# Patient Record
Sex: Female | Born: 1980
Health system: Southern US, Community
[De-identification: ages and names within clinical notes are randomized; demographics above are authoritative.]

## PROBLEM LIST (undated history)

## (undated) DIAGNOSIS — T7840XA Allergy, unspecified, initial encounter: Secondary | ICD-10-CM

## (undated) DIAGNOSIS — G709 Myoneural disorder, unspecified: Secondary | ICD-10-CM

## (undated) DIAGNOSIS — F419 Anxiety disorder, unspecified: Secondary | ICD-10-CM

## (undated) DIAGNOSIS — IMO0002 Reserved for concepts with insufficient information to code with codable children: Secondary | ICD-10-CM

## (undated) DIAGNOSIS — R51 Headache: Secondary | ICD-10-CM

## (undated) DIAGNOSIS — N920 Excessive and frequent menstruation with regular cycle: Secondary | ICD-10-CM

## (undated) DIAGNOSIS — N200 Calculus of kidney: Secondary | ICD-10-CM

## (undated) DIAGNOSIS — N898 Other specified noninflammatory disorders of vagina: Secondary | ICD-10-CM

## (undated) DIAGNOSIS — M419 Scoliosis, unspecified: Secondary | ICD-10-CM

## (undated) DIAGNOSIS — R10814 Left lower quadrant abdominal tenderness: Secondary | ICD-10-CM

## (undated) DIAGNOSIS — J069 Acute upper respiratory infection, unspecified: Secondary | ICD-10-CM

## (undated) DIAGNOSIS — D649 Anemia, unspecified: Secondary | ICD-10-CM

## (undated) DIAGNOSIS — K219 Gastro-esophageal reflux disease without esophagitis: Secondary | ICD-10-CM

## (undated) DIAGNOSIS — R87629 Unspecified abnormal cytological findings in specimens from vagina: Secondary | ICD-10-CM

## (undated) DIAGNOSIS — Z8742 Personal history of other diseases of the female genital tract: Secondary | ICD-10-CM

## (undated) DIAGNOSIS — A749 Chlamydial infection, unspecified: Secondary | ICD-10-CM

## (undated) DIAGNOSIS — G8929 Other chronic pain: Secondary | ICD-10-CM

## (undated) DIAGNOSIS — N63 Unspecified lump in unspecified breast: Secondary | ICD-10-CM

## (undated) DIAGNOSIS — M359 Systemic involvement of connective tissue, unspecified: Secondary | ICD-10-CM

## (undated) DIAGNOSIS — N6310 Unspecified lump in the right breast, unspecified quadrant: Secondary | ICD-10-CM

## (undated) DIAGNOSIS — E669 Obesity, unspecified: Secondary | ICD-10-CM

## (undated) DIAGNOSIS — N644 Mastodynia: Secondary | ICD-10-CM

## (undated) DIAGNOSIS — N83209 Unspecified ovarian cyst, unspecified side: Secondary | ICD-10-CM

## (undated) DIAGNOSIS — I10 Essential (primary) hypertension: Secondary | ICD-10-CM

## (undated) DIAGNOSIS — N946 Dysmenorrhea, unspecified: Secondary | ICD-10-CM

## (undated) DIAGNOSIS — D890 Polyclonal hypergammaglobulinemia: Secondary | ICD-10-CM

## (undated) DIAGNOSIS — D251 Intramural leiomyoma of uterus: Secondary | ICD-10-CM

## (undated) DIAGNOSIS — L509 Urticaria, unspecified: Secondary | ICD-10-CM

## (undated) DIAGNOSIS — A549 Gonococcal infection, unspecified: Secondary | ICD-10-CM

## (undated) HISTORY — DX: Personal history of other diseases of the female genital tract: Z87.42

## (undated) HISTORY — DX: Myoneural disorder, unspecified: G70.9

## (undated) HISTORY — DX: Anemia, unspecified: D64.9

## (undated) HISTORY — DX: Polyclonal hypergammaglobulinemia: D89.0

## (undated) HISTORY — DX: Calculus of kidney: N20.0

## (undated) HISTORY — DX: Systemic involvement of connective tissue, unspecified: M35.9

## (undated) HISTORY — DX: Unspecified lump in the right breast, unspecified quadrant: N63.10

## (undated) HISTORY — DX: Gastro-esophageal reflux disease without esophagitis: K21.9

## (undated) HISTORY — DX: Obesity, unspecified: E66.9

## (undated) HISTORY — PX: DILATION AND CURETTAGE OF UTERUS: SHX78

## (undated) HISTORY — DX: Mastodynia: N64.4

## (undated) HISTORY — DX: Left lower quadrant abdominal tenderness: R10.814

## (undated) HISTORY — DX: Unspecified abnormal cytological findings in specimens from vagina: R87.629

## (undated) HISTORY — DX: Anxiety disorder, unspecified: F41.9

## (undated) HISTORY — DX: Gonococcal infection, unspecified: A54.9

## (undated) HISTORY — DX: Dysmenorrhea, unspecified: N94.6

## (undated) HISTORY — DX: Intramural leiomyoma of uterus: D25.1

## (undated) HISTORY — DX: Reserved for concepts with insufficient information to code with codable children: IMO0002

## (undated) HISTORY — PX: ADENOIDECTOMY: SUR15

## (undated) HISTORY — DX: Allergy, unspecified, initial encounter: T78.40XA

## (undated) HISTORY — DX: Acute upper respiratory infection, unspecified: J06.9

## (undated) HISTORY — DX: Scoliosis, unspecified: M41.9

## (undated) HISTORY — DX: Essential (primary) hypertension: I10

## (undated) HISTORY — DX: Excessive and frequent menstruation with regular cycle: N92.0

## (undated) HISTORY — DX: Other chronic pain: G89.29

## (undated) HISTORY — PX: TUBAL LIGATION: SHX77

## (undated) HISTORY — DX: Headache: R51

## (undated) HISTORY — PX: TONSILLECTOMY: SUR1361

## (undated) HISTORY — DX: Unspecified ovarian cyst, unspecified side: N83.209

## (undated) HISTORY — DX: Unspecified lump in unspecified breast: N63.0

## (undated) HISTORY — DX: Urticaria, unspecified: L50.9

## (undated) HISTORY — DX: Other specified noninflammatory disorders of vagina: N89.8

## (undated) HISTORY — DX: Chlamydial infection, unspecified: A74.9

---
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
## 2002-01-21 ENCOUNTER — Emergency Department (HOSPITAL_COMMUNITY): Admission: EM | Admit: 2002-01-21 | Discharge: 2002-01-21 | Payer: Self-pay | Admitting: Emergency Medicine

## 2002-06-25 ENCOUNTER — Ambulatory Visit (HOSPITAL_COMMUNITY): Admission: AD | Admit: 2002-06-25 | Discharge: 2002-06-25 | Payer: Self-pay | Admitting: Obstetrics and Gynecology

## 2002-06-28 ENCOUNTER — Ambulatory Visit (HOSPITAL_COMMUNITY): Admission: RE | Admit: 2002-06-28 | Discharge: 2002-06-28 | Payer: Self-pay | Admitting: Obstetrics and Gynecology

## 2002-10-20 ENCOUNTER — Ambulatory Visit (HOSPITAL_COMMUNITY): Admission: AD | Admit: 2002-10-20 | Discharge: 2002-10-20 | Payer: Self-pay | Admitting: Obstetrics and Gynecology

## 2002-10-21 ENCOUNTER — Ambulatory Visit (HOSPITAL_COMMUNITY): Admission: AD | Admit: 2002-10-21 | Discharge: 2002-10-21 | Payer: Self-pay | Admitting: Obstetrics and Gynecology

## 2002-11-07 ENCOUNTER — Ambulatory Visit (HOSPITAL_COMMUNITY): Admission: AD | Admit: 2002-11-07 | Discharge: 2002-11-07 | Payer: Self-pay | Admitting: Obstetrics and Gynecology

## 2002-11-08 ENCOUNTER — Ambulatory Visit (HOSPITAL_COMMUNITY): Admission: AD | Admit: 2002-11-08 | Discharge: 2002-11-08 | Payer: Self-pay | Admitting: Obstetrics and Gynecology

## 2002-11-18 ENCOUNTER — Inpatient Hospital Stay (HOSPITAL_COMMUNITY): Admission: AD | Admit: 2002-11-18 | Discharge: 2002-11-22 | Payer: Self-pay | Admitting: Obstetrics and Gynecology

## 2003-02-24 ENCOUNTER — Ambulatory Visit (HOSPITAL_COMMUNITY): Admission: RE | Admit: 2003-02-24 | Discharge: 2003-02-24 | Payer: Self-pay | Admitting: *Deleted

## 2004-04-10 ENCOUNTER — Emergency Department (HOSPITAL_COMMUNITY): Admission: EM | Admit: 2004-04-10 | Discharge: 2004-04-10 | Payer: Self-pay | Admitting: Emergency Medicine

## 2004-09-27 ENCOUNTER — Ambulatory Visit: Payer: Self-pay | Admitting: Family Medicine

## 2004-09-27 ENCOUNTER — Emergency Department (HOSPITAL_COMMUNITY): Admission: EM | Admit: 2004-09-27 | Discharge: 2004-09-27 | Payer: Self-pay | Admitting: Emergency Medicine

## 2005-04-17 ENCOUNTER — Emergency Department (HOSPITAL_COMMUNITY): Admission: EM | Admit: 2005-04-17 | Discharge: 2005-04-18 | Payer: Self-pay | Admitting: Emergency Medicine

## 2005-04-18 ENCOUNTER — Ambulatory Visit: Payer: Self-pay | Admitting: Family Medicine

## 2005-05-23 ENCOUNTER — Ambulatory Visit: Payer: Self-pay | Admitting: Family Medicine

## 2005-08-01 ENCOUNTER — Ambulatory Visit: Payer: Self-pay | Admitting: Family Medicine

## 2005-12-04 ENCOUNTER — Ambulatory Visit: Payer: Self-pay | Admitting: Family Medicine

## 2006-01-21 ENCOUNTER — Ambulatory Visit: Payer: Self-pay | Admitting: Family Medicine

## 2006-04-17 ENCOUNTER — Emergency Department (HOSPITAL_COMMUNITY): Admission: EM | Admit: 2006-04-17 | Discharge: 2006-04-17 | Payer: Self-pay | Admitting: Emergency Medicine

## 2006-05-27 ENCOUNTER — Ambulatory Visit: Payer: Self-pay | Admitting: Family Medicine

## 2006-05-28 ENCOUNTER — Ambulatory Visit: Payer: Self-pay | Admitting: Family Medicine

## 2006-07-29 ENCOUNTER — Ambulatory Visit: Payer: Self-pay | Admitting: Family Medicine

## 2006-08-19 ENCOUNTER — Ambulatory Visit: Payer: Self-pay | Admitting: Family Medicine

## 2006-08-20 ENCOUNTER — Ambulatory Visit (HOSPITAL_COMMUNITY): Admission: RE | Admit: 2006-08-20 | Discharge: 2006-08-20 | Payer: Self-pay | Admitting: Family Medicine

## 2006-10-07 ENCOUNTER — Ambulatory Visit: Payer: Self-pay | Admitting: Family Medicine

## 2006-11-11 ENCOUNTER — Ambulatory Visit: Payer: Self-pay | Admitting: Family Medicine

## 2006-11-26 ENCOUNTER — Ambulatory Visit: Payer: Self-pay | Admitting: Family Medicine

## 2006-11-26 ENCOUNTER — Encounter: Payer: Self-pay | Admitting: Orthopedic Surgery

## 2006-11-26 ENCOUNTER — Ambulatory Visit (HOSPITAL_COMMUNITY): Admission: RE | Admit: 2006-11-26 | Discharge: 2006-11-26 | Payer: Self-pay | Admitting: Family Medicine

## 2007-02-27 ENCOUNTER — Ambulatory Visit: Payer: Self-pay | Admitting: Family Medicine

## 2007-04-03 ENCOUNTER — Ambulatory Visit: Payer: Self-pay | Admitting: Orthopedic Surgery

## 2007-04-03 DIAGNOSIS — S93609A Unspecified sprain of unspecified foot, initial encounter: Secondary | ICD-10-CM | POA: Insufficient documentation

## 2007-04-16 ENCOUNTER — Ambulatory Visit: Payer: Self-pay | Admitting: Family Medicine

## 2007-05-13 ENCOUNTER — Emergency Department (HOSPITAL_COMMUNITY): Admission: EM | Admit: 2007-05-13 | Discharge: 2007-05-13 | Payer: Self-pay | Admitting: Emergency Medicine

## 2007-05-13 ENCOUNTER — Ambulatory Visit: Payer: Self-pay | Admitting: Family Medicine

## 2007-05-14 ENCOUNTER — Emergency Department (HOSPITAL_COMMUNITY): Admission: EM | Admit: 2007-05-14 | Discharge: 2007-05-14 | Payer: Self-pay | Admitting: Emergency Medicine

## 2007-06-26 ENCOUNTER — Ambulatory Visit: Payer: Self-pay | Admitting: Family Medicine

## 2007-06-28 ENCOUNTER — Encounter: Payer: Self-pay | Admitting: Family Medicine

## 2007-06-28 LAB — CONVERTED CEMR LAB
Eosinophils Absolute: 0.3 10*3/uL (ref 0.0–0.7)
Eosinophils Relative: 5 % (ref 0–5)
HCT: 42 % (ref 36.0–46.0)
Hemoglobin: 13.8 g/dL (ref 12.0–15.0)
Lymphocytes Relative: 30 % (ref 12–46)
Lymphs Abs: 1.9 10*3/uL (ref 0.7–4.0)
MCV: 91.3 fL (ref 78.0–100.0)
Monocytes Absolute: 0.5 10*3/uL (ref 0.1–1.0)
Platelets: 342 10*3/uL (ref 150–400)
RDW: 13.4 % (ref 11.5–15.5)
TSH: 0.751 microintl units/mL (ref 0.350–5.50)
Total CHOL/HDL Ratio: 2.2
VLDL: 9 mg/dL (ref 0–40)
WBC: 6.5 10*3/uL (ref 4.0–10.5)

## 2007-11-03 ENCOUNTER — Encounter: Payer: Self-pay | Admitting: Family Medicine

## 2007-11-03 ENCOUNTER — Ambulatory Visit: Payer: Self-pay | Admitting: Family Medicine

## 2007-11-03 DIAGNOSIS — F411 Generalized anxiety disorder: Secondary | ICD-10-CM

## 2007-11-03 DIAGNOSIS — M545 Low back pain: Secondary | ICD-10-CM

## 2007-11-03 DIAGNOSIS — G8929 Other chronic pain: Secondary | ICD-10-CM

## 2007-11-06 ENCOUNTER — Encounter (HOSPITAL_COMMUNITY): Admission: RE | Admit: 2007-11-06 | Discharge: 2007-12-06 | Payer: Self-pay | Admitting: Family Medicine

## 2007-11-06 ENCOUNTER — Encounter: Payer: Self-pay | Admitting: Family Medicine

## 2007-11-10 ENCOUNTER — Encounter: Payer: Self-pay | Admitting: Family Medicine

## 2007-11-10 LAB — CONVERTED CEMR LAB: Glucose, Bld: 78 mg/dL (ref 70–99)

## 2008-01-27 ENCOUNTER — Telehealth: Payer: Self-pay | Admitting: Family Medicine

## 2008-01-28 ENCOUNTER — Ambulatory Visit: Payer: Self-pay | Admitting: Family Medicine

## 2008-02-03 ENCOUNTER — Ambulatory Visit (HOSPITAL_COMMUNITY): Admission: RE | Admit: 2008-02-03 | Discharge: 2008-02-03 | Payer: Self-pay | Admitting: Family Medicine

## 2008-02-03 ENCOUNTER — Ambulatory Visit: Payer: Self-pay | Admitting: Family Medicine

## 2008-02-03 LAB — CONVERTED CEMR LAB
Glucose, Urine, Semiquant: NEGATIVE
Nitrite: NEGATIVE
Specific Gravity, Urine: 1.02
WBC Urine, dipstick: NEGATIVE
pH: 6.5

## 2008-02-05 ENCOUNTER — Encounter: Payer: Self-pay | Admitting: Family Medicine

## 2008-02-05 DIAGNOSIS — N159 Renal tubulo-interstitial disease, unspecified: Secondary | ICD-10-CM | POA: Insufficient documentation

## 2008-02-05 DIAGNOSIS — R109 Unspecified abdominal pain: Secondary | ICD-10-CM

## 2008-02-11 ENCOUNTER — Ambulatory Visit (HOSPITAL_COMMUNITY): Admission: RE | Admit: 2008-02-11 | Discharge: 2008-02-11 | Payer: Self-pay | Admitting: Family Medicine

## 2008-04-07 ENCOUNTER — Emergency Department (HOSPITAL_COMMUNITY): Admission: EM | Admit: 2008-04-07 | Discharge: 2008-04-07 | Payer: Self-pay | Admitting: *Deleted

## 2008-04-19 ENCOUNTER — Ambulatory Visit: Payer: Self-pay | Admitting: Orthopedic Surgery

## 2008-04-19 DIAGNOSIS — M25579 Pain in unspecified ankle and joints of unspecified foot: Secondary | ICD-10-CM

## 2008-05-03 ENCOUNTER — Emergency Department (HOSPITAL_COMMUNITY): Admission: EM | Admit: 2008-05-03 | Discharge: 2008-05-03 | Payer: Self-pay | Admitting: Emergency Medicine

## 2008-05-14 ENCOUNTER — Ambulatory Visit: Payer: Self-pay | Admitting: Family Medicine

## 2008-05-25 ENCOUNTER — Encounter: Payer: Self-pay | Admitting: Family Medicine

## 2008-05-25 ENCOUNTER — Encounter (HOSPITAL_COMMUNITY): Admission: RE | Admit: 2008-05-25 | Discharge: 2008-06-24 | Payer: Self-pay | Admitting: Family Medicine

## 2008-06-25 ENCOUNTER — Encounter (HOSPITAL_COMMUNITY): Admission: RE | Admit: 2008-06-25 | Discharge: 2008-07-25 | Payer: Self-pay | Admitting: Family Medicine

## 2008-07-20 ENCOUNTER — Encounter: Payer: Self-pay | Admitting: Family Medicine

## 2008-08-04 ENCOUNTER — Encounter: Payer: Self-pay | Admitting: Family Medicine

## 2008-08-12 ENCOUNTER — Emergency Department (HOSPITAL_COMMUNITY): Admission: EM | Admit: 2008-08-12 | Discharge: 2008-08-12 | Payer: Self-pay | Admitting: Emergency Medicine

## 2008-08-12 ENCOUNTER — Telehealth: Payer: Self-pay | Admitting: Family Medicine

## 2008-08-13 ENCOUNTER — Emergency Department (HOSPITAL_COMMUNITY): Admission: EM | Admit: 2008-08-13 | Discharge: 2008-08-13 | Payer: Self-pay | Admitting: Emergency Medicine

## 2008-08-15 ENCOUNTER — Emergency Department (HOSPITAL_COMMUNITY): Admission: EM | Admit: 2008-08-15 | Discharge: 2008-08-15 | Payer: Self-pay | Admitting: Emergency Medicine

## 2008-08-16 ENCOUNTER — Ambulatory Visit: Payer: Self-pay | Admitting: Family Medicine

## 2008-08-16 DIAGNOSIS — R110 Nausea: Secondary | ICD-10-CM

## 2008-08-16 DIAGNOSIS — M79609 Pain in unspecified limb: Secondary | ICD-10-CM

## 2008-08-30 ENCOUNTER — Ambulatory Visit: Payer: Self-pay | Admitting: Family Medicine

## 2008-10-14 ENCOUNTER — Telehealth: Payer: Self-pay | Admitting: Family Medicine

## 2008-11-05 ENCOUNTER — Encounter: Payer: Self-pay | Admitting: Family Medicine

## 2008-11-29 ENCOUNTER — Telehealth: Payer: Self-pay | Admitting: Family Medicine

## 2008-11-30 ENCOUNTER — Encounter: Payer: Self-pay | Admitting: Family Medicine

## 2008-12-18 ENCOUNTER — Emergency Department (HOSPITAL_COMMUNITY): Admission: EM | Admit: 2008-12-18 | Discharge: 2008-12-18 | Payer: Self-pay | Admitting: Emergency Medicine

## 2008-12-20 ENCOUNTER — Ambulatory Visit: Payer: Self-pay | Admitting: Family Medicine

## 2008-12-20 ENCOUNTER — Telehealth: Payer: Self-pay | Admitting: Family Medicine

## 2009-01-17 ENCOUNTER — Encounter: Payer: Self-pay | Admitting: Family Medicine

## 2009-01-18 ENCOUNTER — Encounter: Payer: Self-pay | Admitting: Family Medicine

## 2009-01-18 ENCOUNTER — Other Ambulatory Visit: Admission: RE | Admit: 2009-01-18 | Discharge: 2009-01-18 | Payer: Self-pay | Admitting: Family Medicine

## 2009-01-18 ENCOUNTER — Ambulatory Visit: Payer: Self-pay | Admitting: Family Medicine

## 2009-01-18 DIAGNOSIS — R519 Headache, unspecified: Secondary | ICD-10-CM | POA: Insufficient documentation

## 2009-01-18 DIAGNOSIS — N643 Galactorrhea not associated with childbirth: Secondary | ICD-10-CM | POA: Insufficient documentation

## 2009-01-18 DIAGNOSIS — R51 Headache: Secondary | ICD-10-CM | POA: Insufficient documentation

## 2009-01-18 LAB — CONVERTED CEMR LAB
Basophils Absolute: 0.1 10*3/uL (ref 0.0–0.1)
Basophils Relative: 1 % (ref 0–1)
Calcium: 9.2 mg/dL (ref 8.4–10.5)
Cholesterol: 115 mg/dL (ref 0–200)
Glucose, Bld: 74 mg/dL (ref 70–99)
Hemoglobin: 13.6 g/dL (ref 12.0–15.0)
MCHC: 33.1 g/dL (ref 30.0–36.0)
Monocytes Absolute: 0.3 10*3/uL (ref 0.1–1.0)
Neutro Abs: 4.2 10*3/uL (ref 1.7–7.7)
Neutrophils Relative %: 57 % (ref 43–77)
Platelets: 290 10*3/uL (ref 150–400)
Prolactin: 7.8 ng/mL
RDW: 13.3 % (ref 11.5–15.5)
Sodium: 140 meq/L (ref 135–145)
Total CHOL/HDL Ratio: 2.5
Triglycerides: 49 mg/dL (ref <150)

## 2009-01-19 ENCOUNTER — Encounter: Payer: Self-pay | Admitting: Family Medicine

## 2009-01-19 LAB — CONVERTED CEMR LAB
Chlamydia, DNA Probe: NEGATIVE
GC Probe Amp, Genital: NEGATIVE

## 2009-01-24 ENCOUNTER — Encounter: Payer: Self-pay | Admitting: Family Medicine

## 2009-01-27 LAB — CONVERTED CEMR LAB: Candida species: POSITIVE — AB

## 2009-02-07 ENCOUNTER — Ambulatory Visit: Payer: Self-pay | Admitting: Family Medicine

## 2009-02-07 DIAGNOSIS — R197 Diarrhea, unspecified: Secondary | ICD-10-CM

## 2009-02-07 DIAGNOSIS — J019 Acute sinusitis, unspecified: Secondary | ICD-10-CM

## 2009-03-22 ENCOUNTER — Ambulatory Visit: Payer: Self-pay | Admitting: Family Medicine

## 2009-03-29 ENCOUNTER — Encounter (HOSPITAL_COMMUNITY): Admission: RE | Admit: 2009-03-29 | Discharge: 2009-04-28 | Payer: Self-pay | Admitting: Family Medicine

## 2009-03-29 ENCOUNTER — Encounter: Payer: Self-pay | Admitting: Family Medicine

## 2009-05-05 ENCOUNTER — Encounter (HOSPITAL_COMMUNITY): Admission: RE | Admit: 2009-05-05 | Discharge: 2009-06-04 | Payer: Self-pay | Admitting: Family Medicine

## 2009-05-05 ENCOUNTER — Encounter (INDEPENDENT_AMBULATORY_CARE_PROVIDER_SITE_OTHER): Payer: Self-pay | Admitting: *Deleted

## 2009-07-08 ENCOUNTER — Encounter: Payer: Self-pay | Admitting: Family Medicine

## 2009-07-08 ENCOUNTER — Telehealth: Payer: Self-pay | Admitting: Family Medicine

## 2009-07-19 ENCOUNTER — Encounter: Payer: Self-pay | Admitting: Family Medicine

## 2009-12-13 ENCOUNTER — Ambulatory Visit: Payer: Self-pay | Admitting: Family Medicine

## 2009-12-13 DIAGNOSIS — R5381 Other malaise: Secondary | ICD-10-CM | POA: Insufficient documentation

## 2009-12-13 DIAGNOSIS — R5383 Other fatigue: Secondary | ICD-10-CM

## 2009-12-16 ENCOUNTER — Telehealth: Payer: Self-pay | Admitting: Family Medicine

## 2009-12-21 ENCOUNTER — Telehealth: Payer: Self-pay | Admitting: Family Medicine

## 2010-03-13 ENCOUNTER — Ambulatory Visit: Payer: Self-pay | Admitting: Family Medicine

## 2010-03-13 DIAGNOSIS — R52 Pain, unspecified: Secondary | ICD-10-CM | POA: Insufficient documentation

## 2010-03-13 DIAGNOSIS — J209 Acute bronchitis, unspecified: Secondary | ICD-10-CM

## 2010-03-14 LAB — CONVERTED CEMR LAB
Basophils Absolute: 0.1 10*3/uL (ref 0.0–0.1)
CO2: 25 meq/L (ref 19–32)
Chloride: 107 meq/L (ref 96–112)
HDL: 46 mg/dL (ref >39)
Hemoglobin: 14.5 g/dL (ref 12.0–15.0)
LDL Cholesterol: 60 mg/dL (ref 0–99)
Lymphocytes Relative: 27 % (ref 12–46)
Neutro Abs: 6.2 10*3/uL (ref 1.7–7.7)
Platelets: 338 10*3/uL (ref 150–400)
RDW: 13 % (ref 11.5–15.5)
Sodium: 140 meq/L (ref 135–145)
TSH: 0.467 microintl units/mL (ref 0.350–4.500)
Total CHOL/HDL Ratio: 2.7

## 2010-04-04 ENCOUNTER — Encounter: Payer: Self-pay | Admitting: Family Medicine

## 2010-06-01 NOTE — Assessment & Plan Note (Signed)
Summary: OV   Vital Signs:  Patient profile:   30 year old female Menstrual status:  Depo Height:      65 inches Weight:      230.75 pounds BMI:     38.54 O2 Sat:      92 % on Room air Pulse rate:   112 / minute Pulse rhythm:   regular Resp:     16 per minute BP sitting:   110 / 80  (left arm)  Vitals Entered By: Mauricia Area CMA (March 13, 2010 11:04 AM)  Nutrition Counseling: Patient's BMI is greater than 25 and therefore counseled on weight management options.  O2 Flow:  Room air CC: Sore throat, headache   Primary Care Provider:  Syliva Overman MD  CC:  Sore throat and headache.  History of Present Illness: 1 week h/o heAD AND CHEST CONGESTION WHICH HAS WORSENED AND ALSO SORE THROAT AND VOICE LOSS. pT IS EXPERIENCING GENERALISED BODY ACHES, FATIGUE AND VOICE LOSS. SHE continues to experience gechronic back pain and has actually gained weight.  Current Medications (verified): 1)  Topiramate 25 Mg Tabs (Topiramate) .... Take 1 Tablet By Mouth Two Times A Day  Allergies (verified): 1)  ! Lortab  Review of Systems      See HPI General:  Complains of chills, fatigue, loss of appetite, malaise, and weakness. Eyes:  Denies discharge, eye pain, and red eye. ENT:  Complains of hoarseness, nasal congestion, ringing in ears, sinus pressure, and sore throat. CV:  Denies chest pain or discomfort, palpitations, and swelling of hands. Resp:  Complains of cough, shortness of breath, and sputum productive. GI:  Denies abdominal pain, constipation, diarrhea, nausea, and vomiting blood. GU:  Denies dysuria and urinary frequency. MS:  Complains of joint pain, low back pain, mid back pain, and muscle aches. Derm:  Denies itching, lesion(s), and rash. Neuro:  Complains of headaches; denies poor balance and seizures. Psych:  Denies anxiety and depression. Endo:  Denies cold intolerance, excessive hunger, excessive thirst, and excessive urination. Heme:  Denies abnormal bruising  and bleeding. Allergy:  Denies hives or rash and itching eyes.  Physical Exam  General:  Well-developed, obese,in no acute distress; alert,appropriate and cooperative throughout examination. Ill appearing HEENT: No facial asymmetry, blateral cervical adenitis EOMI,frontal and maxillary sinus tenderness, TM's Clear, oropharynx  pink and moist.   Chest: decreased air entry bilaterally, scattered crackles and wheezes CVS: S1, S2, No murmurs, No S3.   Abd: Soft, Nontender.obese  MS: decreased ROM spine,adequate in  hips, shoulders and knees.  Ext: No edema.   CNS: CN 2-12 intact, power tone and sensation normal throughout.   Skin: Intact, no visible lesions or rashes.  Psych: Good eye contact, normal affect.  Memory intact, not anxious or depressed appearing.    Impression & Recommendations:  Problem # 1:  GENERALIZED PAIN (ICD-780.96) Assessment Comment Only  Orders: Tylenol 325 mg tab (EMRORAL)  Problem # 2:  ACUTE BRONCHITIS (ICD-466.0) Assessment: Comment Only  Her updated medication list for this problem includes:    Penicillin V Potassium 500 Mg Tabs (Penicillin v potassium) .Marland Kitchen... Take 1 tablet by mouth three times a day    Tessalon Perles 100 Mg Caps (Benzonatate) .Marland Kitchen... Take 1 capsule by mouth three times a day  Orders: T-CBC w/Diff ([id number]) Depo- Medrol 80mg  (J1040) Rocephin  250mg  (J4782) Admin of Therapeutic Inj  intramuscular or subcutaneous (95621)  Problem # 3:  ACUTE SINUSITIS, UNSPECIFIED (ICD-461.9) Assessment: Comment Only  Her updated medication list for  this problem includes:    Penicillin V Potassium 500 Mg Tabs (Penicillin v potassium) .Marland Kitchen... Take 1 tablet by mouth three times a day    Tessalon Perles 100 Mg Caps (Benzonatate) .Marland Kitchen... Take 1 capsule by mouth three times a day  Problem # 4:  GENERALIZED ANXIETY DISORDER (ICD-300.02)  Problem # 5:  OBESITY (ICD-278.00) Assessment: Deteriorated  Ht: 65 (03/13/2010)   Wt: 230.75 (03/13/2010)   BMI:  38.54 (03/13/2010) therapeutic lifestyle change discussed and encouraged  Complete Medication List: 1)  Topiramate 25 Mg Tabs (Topiramate) .... Take 1 tablet by mouth two times a day 2)  Penicillin V Potassium 500 Mg Tabs (Penicillin v potassium) .... Take 1 tablet by mouth three times a day 3)  Tessalon Perles 100 Mg Caps (Benzonatate) .... Take 1 capsule by mouth three times a day 4)  Fluconazole 150 Mg Tabs (Fluconazole) .... Take 1 tablet by mouth once a day as needed vaginal itch  Other Orders: T-Basic Metabolic Panel [id number]) T-Lipid Profile [id number]) T-TSH [phone]) T- Hemoglobin A1C ([id number])  Patient Instructions: 1)  Please schedule a follow-up appointment in 4 months. 2)  It is important that you exercise regularly at least 20 minutes 5 times a week. If you develop chest pain, have severe difficulty breathing, or feel very tired , stop exercising immediately and seek medical attention. 3)  You need to lose weight. Consider a lower calorie diet and regular exercise.  4)  you are being treated for acute sinusitis and bronchitis. 5)  meds are sent in ,you also need to practice voice rest and salt water gargles for your hoarseness 6)  You will get injections in the office 7)  fasting labs today Prescriptions: FLUCONAZOLE 150 MG TABS (FLUCONAZOLE) Take 1 tablet by mouth once a day as needed vaginal itch  #3 x 0   Entered and Authorized by:   Syliva Overman MD   Signed by:   Syliva Overman MD on 03/13/2010   Method used:   Electronically to        [facility] (retail)       [address] [place], [place]  [address]       Ph: [phone]       Fax: [phone]   RxID:   [id number] TESSALON PERLES 100 MG CAPS (BENZONATATE) Take 1 capsule by mouth three times a day  #21 x 0   Entered and Authorized by:   Syliva Overman MD   Signed by:   Syliva Overman MD on 03/13/2010   Method used:   Electronically to         [facility] (retail)       [address] [place], [place]  [address]       Ph: [phone]       Fax: [phone]   RxID:   [id number] PREDNISONE (PAK) 5 MG TABS (PREDNISONE) Use as directed  #21 x 0   Entered and Authorized by:   Syliva Overman MD   Signed by:   Syliva Overman MD on 03/13/2010   Method used:   Electronically to        [facility] (retail)       [address]       [place], [place]  [address]  Ph: [phone]       Fax: [phone]   RxID:   [id number] PENICILLIN V POTASSIUM 500 MG TABS (PENICILLIN V POTASSIUM) Take 1 tablet by mouth three times a day  #30 x 0   Entered and Authorized by:   Syliva Overman MD   Signed by:   Syliva Overman MD on 03/13/2010   Method used:   Electronically to        [facility] (retail)       [address]       [place], [place]  [address]       Ph: [phone]       Fax: [phone]   RxID:   [id number]    Medication Administration  Injection # 1:    Medication: Depo- Medrol 80mg     Diagnosis: ACUTE BRONCHITIS (ICD-466.0)    Route: IM    Site: RUOQ gluteus    Exp Date: 07/12    Lot #: [id number]    Mfr: Pharmacia    Patient tolerated injection without complications    Given by: Adella Hare LPN (March 13, 2010 12:15 PM)  Injection # 2:    Medication: Rocephin  250mg     Diagnosis: ACUTE BRONCHITIS (ICD-466.0)    Route: IM    Site: LUOQ gluteus    Exp Date: 05/14    Lot #: [id number]    Mfr: [email or web address]    Comments: rocephin 500mg  given    Patient tolerated injection without complications    Given by: Adella Hare LPN (March 13, 2010 12:15 PM)  Medication # 1:    Medication: Tylenol 325 mg tab    Diagnosis: GENERALIZED PAIN (ICD-780.96)    Dose: 1 tablet    Route: po    Exp Date: 10/12    Lot #: [id number]    Mfr: gemini pharm    Patient tolerated medication without  complications    Given by: Adella Hare LPN (March 13, 2010 12:17 PM)  Orders Added: 1)  Est. Patient Level IV [99[address]] 2)  T-Basic Metabolic Panel [[id number]] 3)  T-Lipid Profile [80061-22930] 4)  T-CBC w/Diff [[address]-27062] 5)  T-TSH [37628-31517] 6)  T- Hemoglobin A1C [83036-23375] 7)  Depo- Medrol 80mg  [J1040] 8)  Rocephin  250mg  [J0696] 9)  Admin of Therapeutic Inj  intramuscular or subcutaneous [96372] 10)  Tylenol 325 mg tab [EMRORAL]     Medication Administration  Injection # 1:    Medication: Depo- Medrol 80mg     Diagnosis: ACUTE BRONCHITIS (ICD-466.0)    Route: IM    Site: RUOQ gluteus    Exp Date: 07/12    Lot #: Gunnar [id number]    Mfr: Pharmacia    Patient tolerated injection without complications    Given by: Adella Hare LPN (March 13, 2010 12:15 PM)  Injection # 2:    Medication: Rocephin  250mg     Diagnosis: ACUTE BRONCHITIS (ICD-466.0)    Route: IM    Site: LUOQ gluteus    Exp Date: 05/14    Lot #: [id number]    Mfr: [email or web address]    Comments: rocephin 500mg  given    Patient tolerated injection without complications    Given by: Adella Hare LPN (March 13, 2010 12:15 PM)  Medication # 1:    Medication: Tylenol 325 mg tab    Diagnosis: GENERALIZED PAIN (ICD-780.96)    Dose: 1 tablet    Route: po    Exp Date: 10/12    Lot #: [id number]  Mfr: gemini pharm    Patient tolerated medication without complications    Given by: Adella Hare LPN (March 13, 2010 12:17 PM)  Orders Added: 1)  Est. Patient Level IV [99214] 2)  T-Basic Metabolic Panel [id number] 3)  T-Lipid Profile [80061-22930] 4)  T-CBC w/Diff [82956-21308] 5)  T-TSH [65784-69629] 6)  T- Hemoglobin A1C [83036-23375] 7)  Depo- Medrol 80mg  [J1040] 8)  Rocephin  250mg  [J0696] 9)  Admin of Therapeutic Inj  intramuscular or subcutaneous [96372] 10)  Tylenol 325 mg tab [EMRORAL]

## 2010-06-01 NOTE — Progress Notes (Signed)
  Phone Note From Pharmacy   Caller: [facility] Summary of Call: skelaxin not covered generic soma is do you want to change? Initial call taken by: Adella Hare LPN,  December 16, 2009 4:21 PM  Follow-up for Phone Call        soma has been sent in place of ske;laxin pls let pt know Follow-up by: Syliva Overman MD,  December 18, 2009 8:30 PM  Additional Follow-up for Phone Call Additional follow up Details #1::        called patient, left message Additional Follow-up by: Adella Hare LPN,  December 19, 2009 11:44 AM    Additional Follow-up for Phone Call Additional follow up Details #2::    patient aware Follow-up by: Adella Hare LPN,  December 19, 2009 3:03 PM  New/Updated Medications: CARISOPRODOL 350 MG TABS (CARISOPRODOL) Take 1 tablet by mouth two times a day as needed for back spasm Prescriptions: CARISOPRODOL 350 MG TABS (CARISOPRODOL) Take 1 tablet by mouth two times a day as needed for back spasm  #60 x 3   Entered and Authorized by:   Syliva Overman MD   Signed by:   Syliva Overman MD on 12/18/2009   Method used:   Electronically to        [facility] (retail)       [address]       [place], [place]  [address]       Ph: [phone]       Fax: [phone]   RxID:   [id number]

## 2010-06-01 NOTE — Assessment & Plan Note (Signed)
Summary: office visit   Vital Signs:  Patient profile:   30 year old female Menstrual status:  Depo Height:      65 inches Weight:      219.50 pounds BMI:     36.66 O2 Sat:      97 % Pulse rate:   91 / minute Pulse rhythm:   regular Resp:     16 per minute BP sitting:   130 / 82  (left arm) Cuff size:   large  Vitals Entered By: Everitt Amber LPN (December 13, 2009 2:52 PM)  Nutrition Counseling: Patient's BMI is greater than 25 and therefore counseled on weight management options. CC: has been having backpain and also really bad headaches. Sometimes they are 3x per week and sometimes she goes months without them   Primary Care Provider:  Syliva Overman MD  CC:  has been having backpain and also really bad headaches. Sometimes they are 3x per week and sometimes she goes months without them.  History of Present Illness: Reports  that tshe has not been doing very well. Denies recent fever or chills. Denies sinus pressure, nasal congestion , ear pain or sore throat. Denies chest congestion, or cough productive of sputum. Denies chest pain, palpitations, PND, orthopnea or leg swelling. Denies abdominal pain, nausea, vomitting, diarrhea or constipation. Denies change in bowel movements or bloody stool. Denies dysuria , frequency, incontinence or hesitancy. c/o increased back pain which is disabling and limiting her ability to function Increased  headaches, but denies  vertigoor seizures. Denies depression, anxiety or insomnia. Denies  rash, lesions, or itch.     Current Medications (verified): 1)  None  Allergies (verified): 1)  ! Lortab  Review of Systems      See HPI General:  Complains of fatigue. Eyes:  Denies blurring, discharge, double vision, eye pain, and red eye. MS:  Complains of low back pain, mid back pain, and stiffness; upper too lower back pain sometimes radiaiting to lower ext getting worse , gaining weight , did not complete PT at [facility], saw ortho in [place]  wants second opinion. Neuro:  Complains of headaches; increased frequency and severity of throbbing frontal headache assocd with pounding and nause light and noise a prob 3 days/week in the past 2 months. Endo:  Complains of weight change; denies excessive thirst and excessive urination; continues to gain weight. Heme:  Denies abnormal bruising and bleeding. Allergy:  Complains of seasonal allergies; denies hives or rash and itching eyes.  Physical Exam  General:  Well-developed,obese,in no acute distress; alert,appropriate and cooperative throughout examination HEENT: No facial asymmetry,  EOMI, No sinus tenderness, TM's Clear, oropharynx  pink and moist.   Chest: Clear to auscultation bilaterally.  CVS: S1, S2, No murmurs, No S3.   Abd: Soft, Nontender.  MS: decreased ROM spine,adequate in  hips, shoulders and knees.  Ext: No edema.   CNS: CN 2-12 intact, power tone and sensation normal throughout.   Skin: Intact, no visible lesions or rashes.  Psych: Good eye contact, normal affect.  Memory intact, not anxious or depressed appearing.    Impression & Recommendations:  Problem # 1:  HEADACHE (ICD-784.0) Assessment Deteriorated  The following medications were removed from the medication list:    Ibuprofen 800 Mg Tabs (Ibuprofen) ..... One tab by mouth three times a day prn Her updated medication list for this problem includes:    Naproxen 500 Mg Tabs (Naproxen) .Marland Kitchen... Take 1 tablet by mouth two times a day for 1  week then as needed for back pain or headache  Orders: Neurology Referral (Neuro)  Problem # 2:  BACK PAIN, CHRONIC (ICD-724.5) Assessment: Deteriorated  The following medications were removed from the medication list:    Ibuprofen 800 Mg Tabs (Ibuprofen) ..... One tab by mouth three times a day prn Her updated medication list for this problem includes:    Naproxen 500 Mg Tabs (Naproxen) .Marland Kitchen... Take 1 tablet by mouth two times a day for 1 week then as needed for back  pain or headache    Carisoprodol 350 Mg Tabs (Carisoprodol) .Marland Kitchen... Take 1 tablet by mouth two times a day as needed for back spasm  Orders: Depo- Medrol 80mg  (J1040) Ketorolac-Toradol 15mg  (N8295) Admin of Therapeutic Inj  intramuscular or subcutaneous (62130) Orthopedic Referral (Ortho) Physical Therapy Referral (PT)  Problem # 3:  OBESITY (ICD-278.00) Assessment: Deteriorated  Ht: 65 (12/13/2009)   Wt: 219.50 (12/13/2009)   BMI: 36.66 (12/13/2009)  Problem # 4:  GENERALIZED ANXIETY DISORDER (ICD-300.02) Assessment: Improved  The following medications were removed from the medication list:    Buspar 5 Mg Tabs (Buspirone hcl) .Marland Kitchen... Take 1 tablet by mouth two times a day  Complete Medication List: 1)  Naproxen 500 Mg Tabs (Naproxen) .... Take 1 tablet by mouth two times a day for 1 week then as needed for back pain or headache 2)  Topiramate 25 Mg Tabs (Topiramate) .... Take 1 tablet by mouth two times a day 3)  Carisoprodol 350 Mg Tabs (Carisoprodol) .... Take 1 tablet by mouth two times a day as needed for back spasm  Other Orders: T-Basic Metabolic Panel [phone]) T-Lipid Profile [id number]) T-CBC w/Diff [id number]) T-TSH [phone]) T-Basic Metabolic Panel [phone]) T-Lipid Profile [phone]) T-CBC w/Diff [id number]) T-TSH [id number]) T-PSA [phone])  Patient Instructions: 1)  CPE end  sept 22 or after 2)  You will be referred to Dr Gerilyn Pilgrim about your headaches and to DrHarrison about your back. 3)  It is important that you exercise regularly at least 20 minutes 5 times a week. If you develop chest pain, have severe difficulty breathing, or feel very tired , stop exercising immediately and seek medical attention. 4)  You need to lose weight. Consider a lower calorie diet and regular exercise.  5)  BMP prior to visit, ICD-9: 6)  Lipid Panel prior to visit, ICD-9:fasting sept22 or after 7)  TSH prior to visit, ICD-9: 8)  CBC w/ Diff prior to  visit, ICD-9: Prescriptions: TOPIRAMATE 25 MG TABS (TOPIRAMATE) Take 1 tablet by mouth two times a day  #60 x 2   Entered and Authorized by:   Syliva Overman MD   Signed by:   Syliva Overman MD on 12/13/2009   Method used:   Electronically to        [facility] (retail)       [address] [place], [place]  [address]       Ph: [phone]       Fax: [phone]   RxID:   [id number] SKELAXIN 800 MG TABS (METAXALONE) one tablet twice daily  #60 x 1   Entered and Authorized by:   Syliva Overman MD   Signed by:   Syliva Overman MD on 12/13/2009   Method used:   Electronically to        [facility] (retail)       [address]  [place], [place]  [address]       Ph: [phone]       Fax: [phone]   RxID:   [id number] NAPROXEN 500 MG TABS (NAPROXEN) Take 1 tablet by mouth two times a day for 1 week then as needed for back pain or headache  #60 x 0   Entered and Authorized by:   Syliva Overman MD   Signed by:   Syliva Overman MD on 12/13/2009   Method used:   Electronically to        [facility] (retail)       [address] [place], [place]  [address]       Ph: [phone]       Fax: [phone]   RxID:   [id number]    Medication Administration  Injection # 1:    Medication: Depo- Medrol 80mg     Diagnosis: BACK PAIN, CHRONIC (ICD-724.5)    Route: IM    Site: RUOQ gluteus    Exp Date: 07/2010    Lot #: [id number]     Mfr: Pharmacia    Comments: 80mg  given     Patient tolerated injection without complications    Given by: Everitt Amber LPN (December 13, 2009 3:58 PM)  Injection # 2:    Medication: Ketorolac-Toradol 15mg     Diagnosis: BACK PAIN, CHRONIC (ICD-724.5)    Route: IM    Site: LUOQ gluteus    Exp Date: 06/2011    Lot #: [id number]     Mfr: [email or web address]    Patient tolerated injection without complications    Given by:  Everitt Amber LPN (December 13, 2009 3:58 PM)  Orders Added: 1)  Est. Patient Level IV [99214] 2)  T-Basic Metabolic Panel [[id number]] 3)  T-Lipid Profile [80061-22930] 4)  T-CBC w/Diff [[id number]] 5)  T-TSH [16010-93235] 6)  Depo- Medrol 80mg  [J1040] 7)  Ketorolac-Toradol 15mg  [J1885] 8)  Admin of Therapeutic Inj  intramuscular or subcutaneous [96372] 9)  T-Basic Metabolic Panel [80048-22910] 10)  T-Lipid Profile [80061-22930] 11)  T-CBC w/Diff [57322-02542] 12)  T-TSH [70623-76283] 13)  T-PSA [15176-16073] 14)  Orthopedic Referral [Ortho] 15)  Neurology Referral [Neuro] 16)  Physical Therapy Referral [PT]

## 2010-06-01 NOTE — Letter (Signed)
Summary: MEDICAL RELEASE  MEDICAL RELEASE   Imported By: Lind Guest 07/08/2009 13:44:33  _____________________________________________________________________  External Attachment:    Type:   Image     Comment:   External Document

## 2010-06-01 NOTE — Letter (Signed)
Summary: rehabilitation department  rehabilitation department   Imported By: Lind Guest 07/22/2009 13:54:56  _____________________________________________________________________  External Attachment:    Type:   Image     Comment:   External Document

## 2010-06-01 NOTE — Progress Notes (Signed)
Summary: referral  Phone Note Other Incoming   Caller: dr Jamylah Marinaccio Summary of Call: pls contact pt and find out if she has had physial therapy for back pain this year, if not pls refer her for PT twice weekly for 6 weeks, let her know Dr Romeo Apple will not see her until she has completed a course of pT for back pain pls. Pls let Okey Regal at Dr. Mort Sawyers offic know what is going on with her once you have sorted this out Initial call taken by: Syliva Overman MD,  December 21, 2009 5:34 AM  Follow-up for Phone Call        pt has not done physical therpy this year. So i referred her back and will let caqrol know at [facility] office.  Follow-up by: Rudene Anda,  December 21, 2009 3:28 PM

## 2010-06-01 NOTE — Progress Notes (Signed)
Summary: RX  Phone Note Call from Patient   Summary of Call: PATIENT STATES SHE NEEDS MED FOR ANXIETY, STATES BUSPAR WAS WRITTEN IN NOVEMBER BUT WASNT FILLED BECAUSE SHE DOESN T WANT TO BE ON A DAILY MEDICATION, STATES HER ANXIETY ONLY FLARES UP ONCE AND A WHILE AND PREFERS A as needed MED Initial call taken by: Adella Hare LPN,  July 08, 2009 2:08 PM  Follow-up for Phone Call        advise I recommend behavior techniques of roccasional anxiety, exercise, deep breathing , yoga, etc,meds like xanax are habit forming and should be used a s a last resort inluding behav health eval. Follow-up by: Syliva Overman MD,  July 10, 2009 10:15 PM  Additional Follow-up for Phone Call Additional follow up Details #1::        patient states the methods mentioned does not work for her, states does not have to be xanax, just wants somthing to help her, as needed not daily Additional Follow-up by: Adella Hare LPN,  July 11, 2009 9:20 AM    Additional Follow-up for Phone Call Additional follow up Details #2::    buspar was sent in november pls refill /update and let her know, if no relief then ov, advise her that though it states daily try it as needed Follow-up by: Syliva Overman MD,  July 11, 2009 1:08 PM  Additional Follow-up for Phone Call Additional follow up Details #3:: Details for Additional Follow-up Action Taken: patient agrees Additional Follow-up by: Adella Hare LPN,  July 13, 2009 8:29 AM  Prescriptions: BUSPAR 5 MG TABS (BUSPIRONE HCL) Take 1 tablet by mouth two times a day  #60 x 1   Entered by:   Adella Hare LPN   Authorized by:   Syliva Overman MD   Signed by:   Adella Hare LPN on 16/01/9603   Method used:   Electronically to        [facility] (retail)       [address]       [place], [place]  [address]       Ph: [phone]       Fax: [phone]   RxID:   [id number]

## 2010-06-01 NOTE — Letter (Signed)
Summary: 2nd missed letter  2nd missed letter   Imported By: Lind Guest 04/04/2010 17:04:48  _____________________________________________________________________  External Attachment:    Type:   Image     Comment:   External Document

## 2010-06-01 NOTE — Letter (Signed)
Summary: 1st Missed Appt.  [facility]  [address]   [place], [place] [address]   Phone: [phone]  Fax: [phone]    May 05, 2009  MRN: [id number]  ARMELLA STOGNER [address] [place], [place]  [address]  Dear Ms. Gandolfo,  At [facility], we make every attempt to fit patients into our schedule by reserving several appointment slots for same-day appointments.  However, we cannot always make appointments for patients the same day they are calling.  At the end of the day, we look back at our schedule and find that because of last-minute cancellations and patients not showing up for their scheduled appointments, we have several appointment slots that are left open and could have been used by another person who really needed it.  In the past, you may have been one of the patients who could not get in when you needed to.  But recently, you were one of the patients with an appointment that you didn't show up for or canceled too late for [place] to fill it.  We choose not to charge no-show or last minute cancellation fees to our patients, like many other offices do.  We do not wish to institute that policy and hope we never have to.  However, we kindly request that you assist [place] by providing at least 24 hours' notice if you can't make your appointment.  If no-shows or late cancellations become habitual (i.e. Three or more in a one-year period), we may terminate the physician-patient relationship.    Thank you for your consideration and cooperation.   Altamease Oiler

## 2010-06-12 ENCOUNTER — Other Ambulatory Visit: Payer: Self-pay | Admitting: Adult Health

## 2010-06-12 ENCOUNTER — Other Ambulatory Visit (HOSPITAL_COMMUNITY)
Admission: RE | Admit: 2010-06-12 | Discharge: 2010-06-12 | Disposition: A | Discharge status: Home / Self Care | Payer: Medicaid Other | Source: Ambulatory Visit | Attending: Obstetrics and Gynecology | Admitting: Obstetrics and Gynecology

## 2010-06-12 DIAGNOSIS — Z01419 Encounter for gynecological examination (general) (routine) without abnormal findings: Secondary | ICD-10-CM | POA: Insufficient documentation

## 2010-06-12 DIAGNOSIS — Z113 Encounter for screening for infections with a predominantly sexual mode of transmission: Secondary | ICD-10-CM | POA: Insufficient documentation

## 2010-08-05 LAB — CULTURE, ROUTINE-ABSCESS

## 2010-09-15 NOTE — H&P (Signed)
NAME:  [name], [name]                  ACCOUNT NO.:  [id number]   MEDICAL RECORD NO.:  [id number]                   PATIENT TYPE:  AMB   LOCATION:  DAY                                  FACILITY:  [facility]   PHYSICIAN:  Langley Gauss, M.D.                DATE OF BIRTH:  [birth year]   DATE OF ADMISSION:  02/24/2003  DATE OF DISCHARGE:                                HISTORY & PHYSICAL   This 30 year old gravida 1, para 1, postpartum vaginal delivery November 19, 2002.  Delivery complicated by postpartum hemorrhage with resultant anemia.  The patient did not receive any blood products during that hospitalization.  She did receive Depo-Provera 150 mg IM at the time of discharge and this has  been subsequently repeated.  The patient, however, has had a postpartum  course complicated by a persistent bleeding.  The patient describes the  bleeding as being episodic, occasionally very heavy with blood running down  her leg, saturating her bed clothing, and at other times she has had clots;  however, has not had much cramping.  The patient has subsequently been  treated for chronic endometritis.  When I saw her on February 04, 2003 she was  treated with Rocephin 250 mg IM x1 followed by Keflex 500 mg p.o. b.i.d. x10  and Flagyl 500 mg p.o. b.i.d. x10 days.  GC and Chlamydia cultures negative.  The patient failed to have any improvement in the bleeding symptoms with  this regimen.  Subsequently also she was treated on February 17, 2003 with  Estrace 2 mg p.o. b.i.d. x3 days in an effort to correct the bleeding if it  was due to atrophic endometrium due to Depo-Provera injection.  However, the  additional estrogen dose failed to have any resolution in her symptoms; and  most recently the patient was contacted by telephone on February 22, 2003 she  states that the supplemental Estrace had no effect on her bleeding.  She  continues to have heavy vaginal bleeding at times heavy enough to run down  her  legs.  Plus, at this point in time we __________ to proceed with  diagnostic and therapeutic D&C in an effort to evaluate for retained  products of conception.   The patient's prenatal course had been uncomplicated at the time of  delivery; however, she did have very heavy vaginal bleeding with immediate  postpartum hemorrhage.  The patient was hospitalized for a total of 4 days  due to her resultant anemia; however, the patient states that she did not  receive any antibiotic.  She does state that she bleed very heavily at the  time of delivery and continued to bleed heavily throughout the hospital stay  and had continued to bleed at the time of discharge.   The patient subsequently was followed up February 04, 2003 at which time she,  again, received her second Depo-Provera injection. She states that  examination was not performed at that  time despite her complaints of  continued heavy vaginal bleeding and that prompted consultation with our  office per Dr. Lodema Hong.   Transvaginal ultrasound done on that day was consistent with endometritis  with some fluid loculations noted within the uterine wall, normal ovaries  bilaterally and a purulent appearing discharge.  The purulent appearing  discharge did subsequently disappear with antibiotic treatment; however, the  vaginal bleeding remains unchanged.   ALLERGIES:  She has no known drug allergies.   CURRENT MEDICATIONS:  Include only the Depo-Provera.  She has completed  Keflex and Flagyl as well as the supplemental p.o. Estrace.   PAST MEDICAL HISTORY:  She has had vaginal delivery x1.  No other medical or  surgical history.   PHYSICAL EXAMINATION:  GENERAL:  No acute distress.  VITAL SIGNS:  Blood pressure is 115/70, pulse rate of 80, respiratory rate  of 20.  HEENT:  Negative.  NECK:  No adenopathy.  Neck supple.  Thyroid is nonpalpable.  LUNGS:  Clear.  CARDIOVASCULAR:  Regular rate and rhythm.  ABDOMEN:  Soft and nontender.   No surgical scars are identified.  No  palpable masses.  PELVIC: Normal external genitalia, some blood noted on the inner thighs.  Sterile speculum examination reveals light vaginal bleeding to be noted from  the endocervical os.  No evidence of any cervicitis.  Bimanual examination  reveals slightly increased size uterus, boggy to palpation.  No adnexal  masses.  No tenderness on bimanual examination.   LABORATORY STUDIES:  GC and Chlamydia cultures negative on February 08, 2003.  In addition TSH normal at 0.655, prolactin 19.4 and a hemoglobin of 13.5.   ASSESSMENT:  Patient status post vaginal delivery November 19, 2002, continues  to have what she describes as episodic very heavy vaginal bleeding, although  we have not seen this in the office.  She does describe heavy bleeding at  home saturating bed clothing requiring changing of clothing.  Notably her  delivery had been complicated by a postpartum hemorrhage which likely  resulted in frequent vaginal examinations, as well as __________ bimanual  massage and manipulation of the uterus increasing her risk of infection.  The patient does state that she was profoundly anemic with a hemoglobin of  less than 6;  however, she did not require any blood product transfusion and  she was able to build her hemoglobin with the use of iron replacement  therapy.  The patient now has failed efforts at therapeutic intervention  medically.  She continues to have vaginal bleeding, thus must search for the  possibility of retained products of conception.  The patient for outpatient  surgical therapy, dilatation and curettage.  Risks and benefits of the  procedure are discussed with the patient to include:  Risk of uterine  perforation, infection, and hemorrhage.     ___________________________________________                                         Langley Gauss, M.D.  DC/MEDQ  D:  02/24/2003  T:  02/24/2003  Job:  [id number]

## 2010-09-15 NOTE — H&P (Signed)
   NAME:  [name], [name]                  ACCOUNT NO.:  [id number]   MEDICAL RECORD NO.:  [id number]                   PATIENT TYPE:  OIB   LOCATION:  A414                                 FACILITY:  [facility]   PHYSICIAN:  Tilda Burrow, M.D.              DATE OF BIRTH:  03-04-1981   DATE OF ADMISSION:  11/18/2002  DATE OF DISCHARGE:                                HISTORY & PHYSICAL   REASON FOR ADMISSION:  Pregnancy at 39 weeks and 6 days for probable  prodromal labor.   HISTORY OF PRESENT ILLNESS:  [name] started having cramping about 12:00  p.m., which has worsened during the afternoon. She presented to the hospital  at approximately 5:00 p.m. complaining of cramping and back pain. Cervical  examination revealed 1 cm, 70%, -1 station.   PAST MEDICAL HISTORY:  Negative.   PAST SURGICAL HISTORY:  Negative.   FAMILY HISTORY:  Benign.   SOCIAL HISTORY:  She lives with her mom. She is present and supportive.   PRENATAL COURSE:  Essentially uneventful. Blood type is 0+. UDS is negative.  Rubella is immune. Hepatitis B surface antigen negative. HIV is negative.  Serology is nonreactive. Pap was within normal limits. GC and Chlamydia are  both negative. MS FP normal. GBS status is unknown, so therefore, we will do  GBS culture and start IV prophylaxis. A 28 week hemoglobin 11.5, 28 weeks  hematocrit 30.6. One hour glucose tolerance was 79.  Sickle cell screen was  negative.   PLAN:  We are going to admit for __________ induction. After discussion of  the risks and benefits with patient and mother  In regards to induction of labor, the patient has decided upon induction due  to the fact that she has not slept in the week due to on and off  contractions, so she prefers  induction and is well aware of the risk of  possible C-section in regards to induction of labor.   PLAN:  We are going to do a __________ induction and Pitocin augmentation.  IV antibiotic prophylaxis for  unknown GBS status.     Zerita Boers, Reita Cliche, M.D.    DL/MEDQ  D:  16/01/9603  T:  11/18/2002  Job:  [id number]   cc:   [facility]etric[facility]ne[facility]

## 2010-09-15 NOTE — Op Note (Signed)
   NAME:  [name], [name]                  ACCOUNT NO.:  [id number]   MEDICAL RECORD NO.:  [id number]                   PATIENT TYPE:  INP   LOCATION:  A417                                 FACILITY:  [facility]   PHYSICIAN:  Lazaro Arms, M.D.                DATE OF BIRTH:  [birth year]   DATE OF PROCEDURE:  DATE OF DISCHARGE:                                 OPERATIVE REPORT   DELIVERY NOTE:  [name] was noted to be about +3 station with some  complaints of pressure at approximately 2030. She pushed very effectively  and delivered a viable female infant at 2104.  The mouth and nose were  suctioned on the perineum. There was a double nuchal cord which was tight  and unreducible.  The baby was delivered easily over the next contraction  and the cords were immediately released.  Apgars are 8 and 9,  Weight 6  pounds 15 ounces.   The placenta separated spontaneously and delivered via controlled cord  traction and maternal pushing effort at 2111.  There was an immediate flow  of heavy bleeding.  Twenty units of Pitocin diluted in 1000 cc was infused  rapidly IV and bimanual compression was instituted. However, despite both  these efforts she continued to bleed large amounts of blood.  250 mcg of  Hemabate were given IM x2.  Bimanual compression was continued along with  the Pitocin and she finally achieved homeostasis at approximately 2125.  The  vagina was inspected and no lacerations were found.  Estimated blood loss at  least 1500 cc.  The epidural catheter was removed with the blue tip  visualized as being intact.   PLAN:  To keep the Foley in and continue IV Pitocin throughout the night.     [name], C.N.M.          Lazaro Arms, M.D.    FC/MEDQ  D:  11/19/2002  T:  11/20/2002  Job:  [id number]   cc:   [facility]

## 2010-09-15 NOTE — Op Note (Signed)
NAME:  [name], [name]                  ACCOUNT NO.:  [id number]   MEDICAL RECORD NO.:  [id number]                   PATIENT TYPE:  AMB   LOCATION:  DAY                                  FACILITY:  [facility]   PHYSICIAN:  Langley Gauss, M.D.                DATE OF BIRTH:  09/06/1980   DATE OF PROCEDURE:  02/24/2003  DATE OF DISCHARGE:                                 OPERATIVE REPORT   PREOPERATIVE DIAGNOSIS:  Postpartum OB November 19, 2002 with continued  postpartum bleeding.   POSTOPERATIVE DIAGNOSIS:  Postpartum OB November 19, 2002 with continued  postpartum bleeding.   PROCEDURE:  Dilatation and curettage.   SURGEON:  Langley Gauss, M.D.   ESTIMATED BLOOD LOSS:  Minimal.   COMPLICATIONS:  None.   ANESTHESIA:  General endotracheal with oral airway placed.   FINDINGS:  Diffusely enlarged uterus sounding to a depth of 10 cm with  initial sounding evidence of probable intrauterine synechiae as well as  retained products of conception.   DESCRIPTION OF PROCEDURE:  The patient was taken to the operating room,  vital signs were stable. The patient underwent an uncomplicated induction of  general anesthesia and then placed in the dorsal lithotomy position, prepped  and draped in the usual sterile manner. No evidence of any active vaginal  bleeding. Sterile speculum examination is performed. The cervix is noted to  be without lesions, the anterior lip of the cervix is grasped with a single  tooth tenaculum, uterus was then noted to sound to a depth of 10 cm. There  is some resistance within the mid portion of the uterine cavity likely  consistent with intrauterine synechiae. Progressive dilatation was then  carefully performed up to a size #25 dilator which allows passage of a small  banjo curette through the endocervical os and into the uterine cavity.  Aggressive curettage was performed in the entire uterine cavity with  fibrinous appearing tissue which likely represents  products of conception or  retained placental tissue. Aggressive curettage is performed in the entire  uterine cavity to include the uterine fundus until fine gritty sensation was  appreciated in all quadrants of the uterus to include the uterine fundus.  Intrauterine contour noted to be normal in consistency at that time. Randall  stones forceps were then introduced with grasping type motion. Apparently  some additional probable products of conception obtained at that time,  additional curettage performed until no further tissue was obtained.  Bimanual massage was then performed of the uterus to improve the uterine  tone. The patient is noted to have a blood pressure of 140/90 thus no  Methergine is administered. The patient was then reversed of anesthesia,  taken to the recovery room in stable condition at which time operative  findings discussed with the patient's waiting family.   DISCHARGE SUMMARY:  The patient discharged to home on date of surgery,  February 24, 2003.   DISCHARGE MEDICATIONS:  1. The  patient did receive 1 g of IV Ancef immediately preoperatively.  2. Keflex 500 mg p.o. b.i.d. x10 days.  3. Doxycycline 100 mg p.o. b.i.d. x10 days.  4. Vicoprofen 1 p.o. q. 6h. p.r.n. postoperative cramps, #30 with no refill.   PERTINENT LABORATORY DATA:  Electrolytes within normal limits, O positive  blood type, HCG is negative, hemoglobin 12.39, hematocrit 40.8 with a white  count of 5.8.   HOSPITAL COURSE:  The patient was taken to the ambulatory surgical unit,  taken to the OR where the D&C was performed without complications.  Postoperatively, the patient did well. She did have some light vaginal  bleeding in the recovery room only. The patient was discharged to home. I  did inform the patient's family that final pathology is currently pending  and this likely may possibly represent retained placenta tissue. I did  discuss with them that as many as 10% of placentas can have an  accessory  lobe thus this is not an unexpected finding particularly with her clinical  presentation.      ___________________________________________                                            Langley Gauss, M.D.   DC/MEDQ  D:  02/24/2003  T:  02/24/2003  Job:  [id number]

## 2010-09-20 ENCOUNTER — Encounter: Payer: Self-pay | Admitting: Family Medicine

## 2010-09-21 ENCOUNTER — Ambulatory Visit (INDEPENDENT_AMBULATORY_CARE_PROVIDER_SITE_OTHER): Payer: Medicaid Other | Admitting: Family Medicine

## 2010-09-21 ENCOUNTER — Encounter: Payer: Self-pay | Admitting: Family Medicine

## 2010-09-21 VITALS — BP 138/90 | HR 108 | Resp 16 | Ht 65.75 in | Wt 229.1 lb

## 2010-09-21 DIAGNOSIS — E669 Obesity, unspecified: Secondary | ICD-10-CM

## 2010-09-21 DIAGNOSIS — I10 Essential (primary) hypertension: Secondary | ICD-10-CM

## 2010-09-21 DIAGNOSIS — R5381 Other malaise: Secondary | ICD-10-CM

## 2010-09-21 DIAGNOSIS — M659 Synovitis and tenosynovitis, unspecified: Secondary | ICD-10-CM | POA: Insufficient documentation

## 2010-09-21 DIAGNOSIS — M62838 Other muscle spasm: Secondary | ICD-10-CM | POA: Insufficient documentation

## 2010-09-21 DIAGNOSIS — J309 Allergic rhinitis, unspecified: Secondary | ICD-10-CM | POA: Insufficient documentation

## 2010-09-21 DIAGNOSIS — R5383 Other fatigue: Secondary | ICD-10-CM

## 2010-09-21 MED ORDER — PREDNISONE (PAK) 5 MG PO TABS: 5.0000 mg | ORAL_TABLET | ORAL | Status: DC

## 2010-09-21 MED ORDER — POTASSIUM CHLORIDE 10 MEQ PO TBCR: 10.0000 meq | EXTENDED_RELEASE_TABLET | Freq: Every day | ORAL | Status: DC

## 2010-09-21 MED ORDER — FLUTICASONE FUROATE 27.5 MCG/SPRAY NA SUSP: 2.0000 | Freq: Every day | NASAL | Status: DC

## 2010-09-21 MED ORDER — IBUPROFEN 800 MG PO TABS: 800.0000 mg | ORAL_TABLET | Freq: Three times a day (TID) | ORAL | Status: DC | PRN

## 2010-09-21 MED ORDER — CYCLOBENZAPRINE HCL 10 MG PO TABS: ORAL_TABLET | ORAL | Status: DC

## 2010-09-21 MED ORDER — HYDROCHLOROTHIAZIDE 12.5 MG PO CAPS: 12.5000 mg | ORAL_CAPSULE | Freq: Every day | ORAL | Status: DC

## 2010-09-21 MED ORDER — IBUPROFEN 800 MG PO TABS: 800.0000 mg | ORAL_TABLET | Freq: Three times a day (TID) | ORAL | Status: AC | PRN

## 2010-09-21 NOTE — Progress Notes (Signed)
  Subjective:    Patient ID: [name], female    DOB: November 13, 1980, 30 y.o.   MRN: [id number]  HPI 3 month h/o billateral hand pain over thenar emineneces. Cramping in calves and feet x 2 months  Uncontrolled allergies, since Spring getting a bit better but still a lot of nasal congestion. Drainage is clear, she denies fever, chills or productive cough.   Review of Systems Denies recent fever or chills. Denies sinus pressure,, ear pain or sore throat. Denies chest congestion, productive cough or wheezing. Denies chest pains, palpitations, paroxysmal nocturnal dyspnea, orthopnea and leg swelling Denies abdominal pain, nausea, vomiting,diarrhea or constipation.  . Denies dysuria, frequency, hesitancy or incontinence. Denies joint pain, swelling and limitation in mobility. Denies headaches, seizure, numbness, or tingling. Denies depression,has mild chronic anxiety denies  insomnia. Denies skin break down or rash.        Objective:   Physical Exam Patient alert and oriented and in no Cardiopulmonary distress.  HEENT: No facial asymmetry, EOMI, no sinus tenderness, TM's clear, Oropharynx pink and moist.  Neck supple no adenopathy.  Chest: Clear to auscultation bilaterally.  CVS: S1, S2 no murmurs, no S3.  ABD: Soft non tender. Bowel sounds normal.  Ext: No edema  MS: Adequate ROM spine, shoulders, hips and knees.Thenar wasting  Skin: Intact, no ulcerations or rash noted.  Psych: Good eye contact, normal affect. Memory intact not anxious or depressed appearing.  CNS: CN 2-12 intact, power, tone and sensation normal throughout.        Assessment & Plan:

## 2010-09-21 NOTE — Patient Instructions (Addendum)
F/u in 3 months.  New med for blood pressure, also med sent in for muscle spasm and pain in the hands, if worsens , or does not improve , call for appt with Dr Romeo Apple. Med for allergies also  Chem 7 and TSH today  A healthy diet is rich in fruit, vegetables and whole grains. Poultry fish, nuts and beans are a healthy choice for protein rather then red meat. A low sodium diet and drinking 64 ounces of water daily is generally recommended. Oils and sweet should be limited. Carbohydrates especially for those who are diabetic or overweight, should be limited to 34-45 gram per meal. It is important to eat on a regular schedule, at least 3 times daily. Snacks should be primarily fruits, vegetables or nuts.  It is important that you exercise regularly at least 30 minutes 5 times a week. If you develop chest pain, have severe difficulty breathing, or feel very tired, stop exercising immediately and seek medical attention  Goal of at least 6 pounds

## 2010-09-22 ENCOUNTER — Telehealth: Payer: Self-pay

## 2010-09-22 LAB — BASIC METABOLIC PANEL
CO2: 20 mEq/L (ref 19–32)
Calcium: 9.5 mg/dL (ref 8.4–10.5)
Chloride: 107 mEq/L (ref 96–112)
Sodium: 141 mEq/L (ref 135–145)

## 2010-09-22 MED ORDER — FLUTICASONE PROPIONATE 50 MCG/ACT NA SUSP: 2.0000 | Freq: Every day | NASAL | Status: DC

## 2010-09-22 NOTE — Telephone Encounter (Signed)
pls change to flonase, send in std directions which pop up pls

## 2010-09-22 NOTE — Telephone Encounter (Signed)
Med changed per doctor. Sent to pharmacy

## 2010-09-28 ENCOUNTER — Encounter: Payer: Self-pay | Admitting: Family Medicine

## 2010-09-28 NOTE — Assessment & Plan Note (Signed)
Deteriorated. Patient re-educated about  the importance of commitment to a  minimum of 150 minutes of exercise per week. The importance of healthy food choices with portion control discussed. Encouraged to start a food diary, count calories and to consider  joining a support group. Sample diet sheets offered. Goals set by the patient for the next several months.    

## 2010-09-28 NOTE — Assessment & Plan Note (Signed)
Uncontrolled , will start prescription med

## 2010-09-28 NOTE — Assessment & Plan Note (Signed)
Will check electrolyte status

## 2010-09-28 NOTE — Assessment & Plan Note (Signed)
New diagnosis, DASH diet and the impt of regularl exercise and low sodium diet stresses , she is to start med also

## 2010-11-02 ENCOUNTER — Other Ambulatory Visit: Payer: Self-pay | Admitting: Family Medicine

## 2010-12-21 ENCOUNTER — Encounter: Payer: Self-pay | Admitting: Family Medicine

## 2010-12-22 ENCOUNTER — Encounter: Payer: Self-pay | Admitting: Family Medicine

## 2010-12-22 ENCOUNTER — Ambulatory Visit (INDEPENDENT_AMBULATORY_CARE_PROVIDER_SITE_OTHER): Payer: Medicaid Other | Admitting: Family Medicine

## 2010-12-22 VITALS — BP 130/80 | HR 85 | Resp 16 | Ht 65.75 in | Wt 231.0 lb

## 2010-12-22 DIAGNOSIS — I10 Essential (primary) hypertension: Secondary | ICD-10-CM

## 2010-12-22 DIAGNOSIS — E669 Obesity, unspecified: Secondary | ICD-10-CM

## 2010-12-22 MED ORDER — PHENTERMINE HCL 37.5 MG PO TABS: ORAL_TABLET | ORAL | Status: DC

## 2010-12-22 NOTE — Patient Instructions (Signed)
F/u in 2 months.  Your blood pressure is great.  New med sent for appetite supression . bREAK it in hALF   It is important that you exercise regularly at least 30 minutes 5 times a week. If you develop chest pain, have severe difficulty breathing, or feel very tired, stop exercising immediately and seek medical attention   A healthy diet is rich in fruit, vegetables and whole grains. Poultry fish, nuts and beans are a healthy choice for protein rather then red meat. A low sodium diet and drinking 64 ounces of water daily is generally recommended. Oils and sweet should be limited. Carbohydrates especially for those who are diabetic or overweight, should be limited to 34-45 gram per meal. It is important to eat on a regular schedule, at least 3 times daily. Snacks should be primarily fruits, vegetables or nuts.   Fasting labs today

## 2010-12-23 NOTE — Assessment & Plan Note (Signed)
Controlled, no change in medication  

## 2010-12-23 NOTE — Assessment & Plan Note (Signed)
Deteriorated. Patient re-educated about  the importance of commitment to a  minimum of 150 minutes of exercise per week. The importance of healthy food choices with portion control discussed. Encouraged to start a food diary, count calories and to consider  joining a support group. Sample diet sheets offered. Goals set by the patient for the next several months.    

## 2010-12-23 NOTE — Progress Notes (Signed)
  Subjective:    Patient ID: [name], female    DOB: [birth year], 30 y.o.   MRN: [id number]  HPI The PT is here for follow up and re-evaluation of chronic medical conditions, medication management and review of any available recent lab and radiology data.  Preventive health is updated, specifically  Cancer screening and Immunization.   Questions or concerns regarding consultations or procedures which the PT has had in the interim are  addressed. The PT denies any adverse reactions to current medications since the last visit.  Requests appetite suppressant due to continued weight gain , despite attempts at weight loss     Review of Systems Denies recent fever or chills. Denies sinus pressure, nasal congestion, ear pain or sore throat. Denies chest congestion, productive cough or wheezing. Denies chest pains, palpitations and leg swelling Denies abdominal pain, nausea, vomiting,diarrhea or constipation.   Denies dysuria, frequency, hesitancy or incontinence. Denies joint pain, swelling and limitation in mobility. Denies headaches, seizures, numbness, or tingling. Denies depression, anxiety or insomnia. Denies skin break down or rash.        Objective:   Physical Exam .p1        Assessment & Plan:

## 2011-01-17 LAB — RAPID STREP SCREEN (MED CTR MEBANE ONLY): Streptococcus, Group A Screen (Direct): NEGATIVE

## 2011-01-17 LAB — STREP A DNA PROBE

## 2011-03-07 ENCOUNTER — Encounter: Payer: Self-pay | Admitting: Family Medicine

## 2011-03-12 ENCOUNTER — Ambulatory Visit: Payer: Medicaid Other | Admitting: Family Medicine

## 2011-03-19 ENCOUNTER — Encounter: Payer: Self-pay | Admitting: Family Medicine

## 2011-03-20 ENCOUNTER — Ambulatory Visit: Payer: Medicaid Other | Admitting: Family Medicine

## 2011-03-28 ENCOUNTER — Encounter: Payer: Self-pay | Admitting: Family Medicine

## 2011-04-02 ENCOUNTER — Ambulatory Visit (INDEPENDENT_AMBULATORY_CARE_PROVIDER_SITE_OTHER): Payer: Medicaid Other | Admitting: Family Medicine

## 2011-04-02 ENCOUNTER — Encounter: Payer: Self-pay | Admitting: Family Medicine

## 2011-04-02 VITALS — BP 126/78 | HR 103 | Resp 18 | Ht 65.75 in | Wt 236.1 lb

## 2011-04-02 DIAGNOSIS — E669 Obesity, unspecified: Secondary | ICD-10-CM

## 2011-04-02 DIAGNOSIS — I10 Essential (primary) hypertension: Secondary | ICD-10-CM

## 2011-04-02 DIAGNOSIS — N643 Galactorrhea not associated with childbirth: Secondary | ICD-10-CM

## 2011-04-02 DIAGNOSIS — R5383 Other fatigue: Secondary | ICD-10-CM

## 2011-04-02 DIAGNOSIS — M549 Dorsalgia, unspecified: Secondary | ICD-10-CM

## 2011-04-02 DIAGNOSIS — Z23 Encounter for immunization: Secondary | ICD-10-CM

## 2011-04-02 DIAGNOSIS — M25531 Pain in right wrist: Secondary | ICD-10-CM

## 2011-04-02 DIAGNOSIS — M25539 Pain in unspecified wrist: Secondary | ICD-10-CM

## 2011-04-02 NOTE — Assessment & Plan Note (Signed)
Unchanged, check prolactin level

## 2011-04-02 NOTE — Assessment & Plan Note (Signed)
Unchanged, med refilled.

## 2011-04-02 NOTE — Assessment & Plan Note (Signed)
Hurt on job on 02/20/2011, no improvement in wrist pain , has limitation in mobility, followed by company md and has upcoming appt with ortho

## 2011-04-02 NOTE — Assessment & Plan Note (Signed)
Needs to resume HCTZ and potassium

## 2011-04-02 NOTE — Progress Notes (Signed)
  Subjective:    Patient ID: [name], female    DOB: 1981-02-19, 30 y.o.   MRN: [id number]  HPI The PT is here for follow up and re-evaluation of chronic medical conditions, medication management and review of any available recent lab and radiology data.  Preventive health is updated, specifically  Cancer screening and Immunization.   Questions or concerns regarding consultations or procedures which the PT has had in the interim are  addressed. The PT denies any adverse reactions to current medications since the last visit.  Recently injured her wrist on the job, and has upcoming ortho eval for this since not improving Concerned about excessive appetite and failure to lose weight , was afraid to use antidepressant but wants to discuss with me again , since she feels she needs his to hep with weight loss      Review of Systems See HPI Denies recent fever or chills. Denies sinus pressure, nasal congestion, ear pain or sore throat. Denies chest congestion, productive cough or wheezing. Denies chest pains, palpitations and leg swelling Denies abdominal pain, nausea, vomiting,diarrhea or constipation.   Denies dysuria, frequency, hesitancy or incontinence.  Denies headaches, seizures, numbness, or tingling. Denies depression, anxiety or insomnia. Denies skin break down or rash.       Objective:   Physical Exam Patient alert and oriented and in no cardiopulmonary distress.  HEENT: No facial asymmetry, EOMI, no sinus tenderness,  oropharynx pink and moist.  Neck supple no adenopathy.  Chest: Clear to auscultation bilaterally.  CVS: S1, S2 no murmurs, no S3.  ABD: Soft non tender. Bowel sounds normal.  Ext: No edema  MS: Adequate ROM spine, shoulders, hips and knees.  Skin: Intact, no ulcerations or rash noted.  Psych: Good eye contact, normal affect. Memory intact not anxious or depressed appearing.  CNS: CN 2-12 intact, power, tone and sensation normal  throughout.        Assessment & Plan:

## 2011-04-02 NOTE — Assessment & Plan Note (Signed)
Deteriorated. Patient re-educated about  the importance of commitment to a  minimum of 150 minutes of exercise per week. The importance of healthy food choices with portion control discussed. Encouraged to start a food diary, count calories and to consider  joining a support group. Sample diet sheets offered. Goals set by the patient for the next several months.   Pt to start half phentermine daily, goal weight loss of 4 to 6 pounds per month

## 2011-04-02 NOTE — Patient Instructions (Signed)
F/u in 8 weeks.  Pls change your eating habits as we have discussed.   Ensure you drink 64 ounces water daily.   Labs today HBA1C, TSH, chem 7 and prolactin level.   It is important that you exercise regularly at least 30 minutes 5 times a week. If you develop chest pain, have severe difficulty breathing, or feel very tired, stop exercising immediately and seek medical attention    Start phentermine HALF every morning. You need to change food choices, and eat on a schedule between 7am and 7 pm  Stop phentermine if you develop any adverse side effects , like we discussed.  Weight loss goal of 4 to 6 pounds per month, you can do this!

## 2011-04-03 ENCOUNTER — Other Ambulatory Visit: Payer: Self-pay | Admitting: Family Medicine

## 2011-04-03 ENCOUNTER — Telehealth: Payer: Self-pay | Admitting: Family Medicine

## 2011-04-03 DIAGNOSIS — E669 Obesity, unspecified: Secondary | ICD-10-CM

## 2011-04-03 LAB — COMPREHENSIVE METABOLIC PANEL
ALT: <8 U/L (ref 0–35)
AST: 10 U/L (ref 0–37)
Albumin: 4.2 g/dL (ref 3.5–5.2)
CO2: 27 mEq/L (ref 19–32)
Calcium: 9.3 mg/dL (ref 8.4–10.5)
Chloride: 107 mEq/L (ref 96–112)
Potassium: 3.8 mEq/L (ref 3.5–5.3)
Sodium: 141 mEq/L (ref 135–145)
Total Protein: 7.3 g/dL (ref 6.0–8.3)

## 2011-04-03 LAB — PROLACTIN: Prolactin: 10.6 ng/mL

## 2011-04-03 LAB — HEMOGLOBIN A1C: Mean Plasma Glucose: 100 mg/dL (ref <117)

## 2011-04-03 MED ORDER — PHENTERMINE HCL 37.5 MG PO TABS: ORAL_TABLET | ORAL | Status: DC

## 2011-04-03 NOTE — Telephone Encounter (Signed)
Sent in

## 2011-04-08 NOTE — Assessment & Plan Note (Signed)
Controlled, no change in medication  

## 2011-04-10 ENCOUNTER — Encounter (HOSPITAL_COMMUNITY): Payer: Self-pay

## 2011-04-10 ENCOUNTER — Emergency Department (HOSPITAL_COMMUNITY)
Admission: EM | Admit: 2011-04-10 | Discharge: 2011-04-10 | Disposition: A | Discharge status: Home / Self Care | Payer: Medicaid Other | Attending: Emergency Medicine | Admitting: Emergency Medicine

## 2011-04-10 ENCOUNTER — Emergency Department (HOSPITAL_COMMUNITY): Payer: Medicaid Other

## 2011-04-10 ENCOUNTER — Other Ambulatory Visit: Payer: Self-pay

## 2011-04-10 DIAGNOSIS — I10 Essential (primary) hypertension: Secondary | ICD-10-CM | POA: Insufficient documentation

## 2011-04-10 DIAGNOSIS — R0789 Other chest pain: Secondary | ICD-10-CM | POA: Insufficient documentation

## 2011-04-10 DIAGNOSIS — R059 Cough, unspecified: Secondary | ICD-10-CM | POA: Insufficient documentation

## 2011-04-10 DIAGNOSIS — R0989 Other specified symptoms and signs involving the circulatory and respiratory systems: Secondary | ICD-10-CM | POA: Insufficient documentation

## 2011-04-10 DIAGNOSIS — R0600 Dyspnea, unspecified: Secondary | ICD-10-CM

## 2011-04-10 DIAGNOSIS — J45909 Unspecified asthma, uncomplicated: Secondary | ICD-10-CM | POA: Insufficient documentation

## 2011-04-10 DIAGNOSIS — R05 Cough: Secondary | ICD-10-CM

## 2011-04-10 DIAGNOSIS — R0609 Other forms of dyspnea: Secondary | ICD-10-CM | POA: Insufficient documentation

## 2011-04-10 MED ORDER — ALBUTEROL SULFATE HFA 108 (90 BASE) MCG/ACT IN AERS: 2.0000 | INHALATION_SPRAY | RESPIRATORY_TRACT | Status: DC | PRN

## 2011-04-10 NOTE — ED Notes (Signed)
Pt reports starting a new job today that had a lot of dust in the air.  Pt has hx of asthma and reports having an asthma attack while she was there.  Pt reports taking 1 breathing treatment prior to arrival w/out much relief.  Pt reports "tightness" to her chest, and cough.  nad noted

## 2011-04-10 NOTE — ED Provider Notes (Signed)
History   This chart was scribed for Raeford Razor, MD by Clarita Crane. The patient was seen in room [facility] and the patient's care was started at 9:05PM.   CSN: [id number] Arrival date & time: 04/10/2011  8:47 PM   First MD Initiated Contact with Patient 04/10/11 2102      Chief Complaint  Patient presents with  . Cough  . Pleurisy   HPI [name] is a 30 y.o. 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 Notes symptoms were mild to moderately relieved with use of inhaler. Denies fever, chills, nausea, vomiting, swelling of lower extremities, rash, sore throat. Patient with h/o asthma and hypertension but denies h/o blood clots. Reports she is currently on Depo-Provera shot.  Past Medical History  Diagnosis Date  . Anxiety   . Obesity   . Asthma     Past Surgical History  Procedure Date  . Tonsillectomy     Family History  Problem Relation Age of Onset  . Hypertension Mother   . Diabetes Mother   . Heart disease Mother   . Diabetes Father   . Cancer Paternal Aunt     breast  . Hypertension Maternal Grandmother     History  Substance Use Topics  . Smoking status: Never Smoker   . Smokeless tobacco: Not on file  . Alcohol Use: No    OB History    Grav Para Term Preterm Abortions TAB SAB Ect Mult Living                  Review of Systems 10 Systems reviewed and are negative for acute change except as noted in the HPI.  Allergies  Hydrocodone-acetaminophen and Oxycodone  Home Medications   Current Outpatient Rx  Name Route Sig Dispense Refill  . HYDROCHLOROTHIAZIDE 12.5 MG PO CAPS Oral Take 1 capsule (12.5 mg total) by mouth daily. 30 capsule 2  . MEDROXYPROGESTERONE ACETATE 150 MG/ML IM SUSP Intramuscular Inject 150 mg into the muscle every 3 (three) months.       Marland Kitchen MOTRIN 800 MG PO TABS  TAKE 1 TABLET EVERY 8 HOURS AS NEEDED FOR PAIN. 30 each 4  . POTASSIUM CHLORIDE 10 MEQ PO TBCR Oral Take 1 tablet (10 mEq total) by mouth daily. 30 tablet 2  . ALBUTEROL SULFATE HFA 108 (90 BASE) MCG/ACT IN AERS Inhalation Inhale 2 puffs into the lungs every 4 (four) hours as needed for wheezing. 1 Inhaler 2  . PHENTERMINE HCL 37.5 MG PO TABS  One half tab daily 30 tablet 1    BP 141/101  Pulse 101  Temp(Src) 99.3 F (37.4 C) (Oral)  Resp 23  Ht 5\' 5"  (1.651 m)  Wt 236 lb (107.049 kg)  BMI 39.27 kg/m2  SpO2 99%  Physical Exam  Nursing note and vitals reviewed. Constitutional: She is oriented to person, place, and time. She appears well-developed and well-nourished. No distress.  HENT:  Head: Normocephalic and atraumatic.  Eyes: EOM are normal. Pupils are equal, round, and reactive to light.  Neck: Neck supple. No tracheal deviation present.  Cardiovascular: Normal rate and regular rhythm.  Exam reveals no gallop and no friction rub.   No murmur heard. Pulmonary/Chest: Effort normal. No respiratory distress. She has no wheezes.       Rhonchi noted to right side.  Abdominal: Soft. She exhibits no distension.  Musculoskeletal: Normal range of motion. She exhibits no edema.  Neurological: She is alert and oriented to person, place, and time. No sensory deficit.  Skin: Skin is warm and dry.  Psychiatric: She has a normal mood and affect. Her behavior is normal.    ED Course  Procedures (including critical care time)  DIAGNOSTIC STUDIES: Oxygen Saturation is 100% on room air, normal by my interpretation.    COORDINATION OF CARE: 10:12PM- Patient informed of imaging results and clinical impression. Recommend to carry inhaler with her at all times. Will d/c home.    Labs Reviewed - No data to display Dg Chest 2 View  04/10/2011  *RADIOLOGY REPORT*  Clinical Data: Asthma attack with cough, shortness of breath, and chest tightness  CHEST - 2 VIEW   Comparison: 05/03/2008  Findings: The heart size and pulmonary vascularity are normal. The lungs appear clear and expanded without focal air space disease or consolidation. No blunting of the costophrenic angles.  No pneumothorax.  IMPRESSION: No evidence of active pulmonary disease.  Original Report Authenticated By: Marlon Pel, M.D.   EKG:  Rhythm: normal sinus  Rate: 92 Axis: normal Intervals: normal ST segments: NS ST changes. Flattening of t waves in III   1. Cough   2. Dyspnea       MDM  30yf with chest tightness and dyspnea. Suspect related to allergen exposure at job today. Clinically well appearing and no respiratory distress. cxr clear and ekg nondiagnostic. PE considered but low clinical suspicion. Scripts for inhalers because hx of asthma and says out and sharing son's. outpt fu as needed. No wheezing on exam and do not feel needs course steroids.      I personally preformed the services scribed in my presence. The recorded information has been reviewed and considered. Raeford Razor, MD.    Raeford Razor, MD 04/11/11 Earle Gell

## 2011-04-16 ENCOUNTER — Encounter: Payer: Self-pay | Admitting: Family Medicine

## 2011-04-18 ENCOUNTER — Encounter: Payer: Self-pay | Admitting: Family Medicine

## 2011-04-18 ENCOUNTER — Ambulatory Visit (INDEPENDENT_AMBULATORY_CARE_PROVIDER_SITE_OTHER): Payer: Medicaid Other | Admitting: Family Medicine

## 2011-04-18 VITALS — BP 118/82 | HR 94 | Resp 16 | Ht 65.75 in | Wt 236.4 lb

## 2011-04-18 DIAGNOSIS — M25531 Pain in right wrist: Secondary | ICD-10-CM

## 2011-04-18 DIAGNOSIS — I10 Essential (primary) hypertension: Secondary | ICD-10-CM

## 2011-04-18 DIAGNOSIS — J45909 Unspecified asthma, uncomplicated: Secondary | ICD-10-CM

## 2011-04-18 DIAGNOSIS — M25539 Pain in unspecified wrist: Secondary | ICD-10-CM

## 2011-04-18 MED ORDER — BECLOMETHASONE DIPROPIONATE 40 MCG/ACT IN AERS: 2.0000 | INHALATION_SPRAY | Freq: Two times a day (BID) | RESPIRATORY_TRACT | Status: DC

## 2011-04-18 NOTE — Assessment & Plan Note (Signed)
New onset reportedly had childhood asthma. Refer for testing , to pulmonary and start q var

## 2011-04-18 NOTE — Patient Instructions (Addendum)
F/u as before. New additional inhaler Qvar, to reduce wheezing episodes. You are referred for lung function tests and to see Dr Juanetta Gosling

## 2011-04-18 NOTE — Progress Notes (Signed)
Subjective:     Patient ID: [name], female   DOB: 08/16/1980, 30 y.o.   MRN: [id number]  HPI Pt seen in ed on 04/10/2011 with acute wheezing and sOB. States she had asthma in childhood, no episodes since this past week, believes her symptoms are triggered by changes in her work environment.She was discharged from the Ed with a rx for ventolin. Has used this for the past 2 days this week  Pt currently on light duty from her job following right hand injury. States since last Monday 04/09/2011 she has been rotated to different work environs and think this is the trigger of her problems  Still expericcing chest tightness and sOB with activity Review of Systems See HPI Denies recent fever or chills. Denies sinus pressure, nasal congestion, ear pain or sore throat. Denies chest congestion, productive coughg. Denies chest pains, palpitations and leg swelling Denies abdominal pain, nausea, vomiting,diarrhea or constipation.   Denies dysuria, frequency, hesitancy or incontinence. .Continued right hand pain , swelling , and limitation in mobility Denies headaches, seizures, numbness, or tingling. Denies depression, anxiety or insomnia. Denies skin break down or rash.        Objective:   Physical Exam Patient alert and oriented and in no cardiopulmonary distress.  HEENT: No facial asymmetry, EOMI, no sinus tenderness,  oropharynx pink and moist.  Neck supple no adenopathy.  Chest: .Bilateral wheezes with decreased air entry though adequate  CVS: S1, S2 no murmurs, no S3.  ABD: Soft non tender. Bowel sounds normal.  Ext: No edema  MS: Adequate ROM spine, shoulders, hips and knees.decreased ROM right hand with swelling  Skin: Intact, no ulcerations or rash noted.  Psych: Good eye contact, normal affect. Memory intact not anxious or depressed appearing.  CNS: CN 2-12 intact, power, tone and sensation normal throughout.     Assessment:         Plan:

## 2011-04-19 ENCOUNTER — Telehealth: Payer: Self-pay | Admitting: Family Medicine

## 2011-04-19 NOTE — Assessment & Plan Note (Signed)
Controlled, no change in medication  

## 2011-04-19 NOTE — Assessment & Plan Note (Signed)
Persistent , followed by ortho, this is a workman's comp case

## 2011-04-19 NOTE — Telephone Encounter (Signed)
Noted, thanks!

## 2011-04-26 ENCOUNTER — Ambulatory Visit (HOSPITAL_COMMUNITY)
Admission: RE | Admit: 2011-04-26 | Discharge: 2011-04-26 | Disposition: A | Discharge status: Home / Self Care | Payer: Medicaid Other | Source: Ambulatory Visit | Attending: Family Medicine | Admitting: Family Medicine

## 2011-04-26 DIAGNOSIS — R0609 Other forms of dyspnea: Secondary | ICD-10-CM | POA: Insufficient documentation

## 2011-04-26 DIAGNOSIS — R0989 Other specified symptoms and signs involving the circulatory and respiratory systems: Secondary | ICD-10-CM | POA: Insufficient documentation

## 2011-04-26 NOTE — Procedures (Signed)
NAME:  [name], [name]        ACCOUNT NO.:  [id number]  MEDICAL RECORD NO.:  [id number]  LOCATION:  RESP                          FACILITY:  [facility]  PHYSICIAN:  Ahmira Boisselle L. Juanetta Gosling, M.D.DATE OF BIRTH:  1980-12-20  DATE OF PROCEDURE: DATE OF DISCHARGE:                           PULMONARY FUNCTION TEST   Reason for pulmonary function testing is asthma.  1. Spirometry is normal. 2. Lung volumes are normal. 3. DLCO is minimally reduced, but corrects when accounting for volume.     There may be reduction in lung volume because of body habitus     noting the patient's height and weight. 4. This study is consistent, but not diagnostic of asthma.     Amylia Collazos L. Juanetta Gosling, M.D.     EL[facility]/MEDQ  D:  04/26/2011  T:  04/26/2011  Job:  [id number]

## 2011-05-01 DIAGNOSIS — M359 Systemic involvement of connective tissue, unspecified: Secondary | ICD-10-CM

## 2011-05-01 HISTORY — DX: Systemic involvement of connective tissue, unspecified: M35.9

## 2011-05-07 ENCOUNTER — Telehealth: Payer: Self-pay | Admitting: Family Medicine

## 2011-05-08 ENCOUNTER — Encounter: Payer: Self-pay | Admitting: Family Medicine

## 2011-05-08 NOTE — Telephone Encounter (Signed)
Called pt and she has appt at 1:15 05/09/2011 to see dr. Lodema Hong. Pt is aware

## 2011-05-08 NOTE — Telephone Encounter (Signed)
pls sched ov asap for clinical eval before test is ordered

## 2011-05-09 ENCOUNTER — Ambulatory Visit (INDEPENDENT_AMBULATORY_CARE_PROVIDER_SITE_OTHER): Payer: Medicaid Other | Admitting: Family Medicine

## 2011-05-09 ENCOUNTER — Encounter: Payer: Self-pay | Admitting: Family Medicine

## 2011-05-09 VITALS — BP 124/76 | HR 86 | Resp 18 | Ht 65.75 in | Wt 236.1 lb

## 2011-05-09 DIAGNOSIS — N6452 Nipple discharge: Secondary | ICD-10-CM

## 2011-05-09 DIAGNOSIS — I10 Essential (primary) hypertension: Secondary | ICD-10-CM

## 2011-05-09 DIAGNOSIS — N6459 Other signs and symptoms in breast: Secondary | ICD-10-CM

## 2011-05-09 DIAGNOSIS — N643 Galactorrhea not associated with childbirth: Secondary | ICD-10-CM

## 2011-05-09 NOTE — Progress Notes (Signed)
  Subjective:    Patient ID: [name], female    DOB: 1980-10-07, 31 y.o.   MRN: [id number]  HPI 2 day h/o bloody d/c from right nipple, no known trauma, has had intermittent galactorreah for the past 8 years  Feels pain and swelling in right breast also    Review of Systems See HPI Denies recent fever or chills. Denies sinus pressure, nasal congestion, ear pain or sore throat. Denies chest congestion, productive cough or wheezing. Denies chest pains, palpitations and leg swelling       Objective:   Physical Exam  Right breast tender , possible mass near areola at 9 o clock, no supraclavicular or axillary adenopathy Bloody nipple d/c, gauic positive when tested  Chest CTA CVS:S1, S2 no murmur    Assessment & Plan:

## 2011-05-09 NOTE — Patient Instructions (Signed)
F/U as before.  You are referred for imaging studies to see why you have a bloody discharge from the right breast

## 2011-05-11 ENCOUNTER — Ambulatory Visit
Admission: RE | Admit: 2011-05-11 | Discharge: 2011-05-11 | Disposition: A | Discharge status: Home / Self Care | Payer: Medicaid Other | Source: Ambulatory Visit | Attending: Family Medicine | Admitting: Family Medicine

## 2011-05-11 DIAGNOSIS — N6452 Nipple discharge: Secondary | ICD-10-CM

## 2011-05-13 NOTE — Assessment & Plan Note (Signed)
Longstanding galactorrhea unchanged, however bloody d/c from right breast spontaneously in 2 days, will refer for further investigation

## 2011-05-13 NOTE — Assessment & Plan Note (Signed)
Controlled, no change in medication  

## 2011-05-15 ENCOUNTER — Ambulatory Visit (HOSPITAL_COMMUNITY)
Admission: RE | Admit: 2011-05-15 | Discharge: 2011-05-15 | Disposition: A | Discharge status: Home / Self Care | Payer: Medicaid Other | Source: Ambulatory Visit | Attending: Pulmonary Disease | Admitting: Pulmonary Disease

## 2011-05-15 DIAGNOSIS — R0989 Other specified symptoms and signs involving the circulatory and respiratory systems: Secondary | ICD-10-CM

## 2011-05-15 DIAGNOSIS — R0609 Other forms of dyspnea: Secondary | ICD-10-CM | POA: Insufficient documentation

## 2011-05-15 DIAGNOSIS — I10 Essential (primary) hypertension: Secondary | ICD-10-CM | POA: Insufficient documentation

## 2011-05-15 NOTE — Progress Notes (Signed)
*  PRELIMINARY RESULTS* Echocardiogram 2D Echocardiogram has been performed.  Conrad St. John 05/15/2011, 9:39 AM

## 2011-05-31 ENCOUNTER — Encounter: Payer: Self-pay | Admitting: Family Medicine

## 2011-06-04 ENCOUNTER — Ambulatory Visit: Payer: Medicaid Other | Admitting: Family Medicine

## 2011-12-07 ENCOUNTER — Encounter: Payer: Self-pay | Admitting: Family Medicine

## 2011-12-07 ENCOUNTER — Ambulatory Visit (INDEPENDENT_AMBULATORY_CARE_PROVIDER_SITE_OTHER): Payer: Medicaid Other | Admitting: Family Medicine

## 2011-12-07 VITALS — BP 112/88 | HR 108 | Resp 15 | Ht 65.75 in | Wt 240.0 lb

## 2011-12-07 DIAGNOSIS — F411 Generalized anxiety disorder: Secondary | ICD-10-CM

## 2011-12-07 DIAGNOSIS — L309 Dermatitis, unspecified: Secondary | ICD-10-CM

## 2011-12-07 DIAGNOSIS — M549 Dorsalgia, unspecified: Secondary | ICD-10-CM

## 2011-12-07 DIAGNOSIS — F4321 Adjustment disorder with depressed mood: Secondary | ICD-10-CM

## 2011-12-07 DIAGNOSIS — L259 Unspecified contact dermatitis, unspecified cause: Secondary | ICD-10-CM

## 2011-12-07 DIAGNOSIS — G4700 Insomnia, unspecified: Secondary | ICD-10-CM

## 2011-12-07 MED ORDER — METHYLPREDNISOLONE ACETATE 40 MG/ML IJ SUSP
40.0000 mg | Freq: Once | INTRAMUSCULAR | Status: AC
  Administered 2011-12-07: 40 mg via INTRAMUSCULAR

## 2011-12-07 MED ORDER — CYCLOBENZAPRINE HCL 10 MG PO TABS: 10.0000 mg | ORAL_TABLET | Freq: Three times a day (TID) | ORAL | Status: DC | PRN

## 2011-12-07 MED ORDER — CLOTRIMAZOLE-BETAMETHASONE 1-0.05 % EX CREA: TOPICAL_CREAM | CUTANEOUS | Status: DC

## 2011-12-07 MED ORDER — FLUOXETINE HCL 10 MG PO CAPS: 10.0000 mg | ORAL_CAPSULE | Freq: Every day | ORAL | Status: DC

## 2011-12-07 MED ORDER — ZOLPIDEM TARTRATE 5 MG PO TABS: 5.0000 mg | ORAL_TABLET | Freq: Every evening | ORAL | Status: DC | PRN

## 2011-12-07 MED ORDER — CLONAZEPAM 0.5 MG PO TABS: 0.5000 mg | ORAL_TABLET | Freq: Two times a day (BID) | ORAL | Status: DC

## 2011-12-07 NOTE — Progress Notes (Signed)
  Subjective:    Patient ID: [name], female    DOB: 1980-06-05, 31 y.o.   MRN: [id number]  HPI  Pt here with multiple concerns.  Medications reviewed  Anxiety/panic attacks- history of anxiety and panic attacks, last on meds as a teenager. She was doing well until her grandmother passed in May, now when she thinks of her she gets palpitations, anxiety and panic attacks that take a few moments to calm her. She feels like she is always on edge, but denies feeling sad, depressed, lonely. Her family is supportive she continues to work as normal. She has not been able to sleep for many weeks now, has been taking Tylenol PM to do so. Back pain- chronic back pain, has scoliosis as well, has flares of pain every now and then, no recent injury, pain in lower back occasionally radiates to buttocks Rash on inner groin for weeks, itchy in nature     Review of Systems   GEN- denies fatigue, fever, weight loss,weakness, recent illness HEENT- denies eye drainage, change in vision, nasal discharge, CVS- denies chest pain, palpitations RESP- denies SOB, cough, wheeze ABD- denies N/V, change in stools, abd pain GU- denies dysuria, hematuria, dribbling, incontinence MSK- denies joint pain, muscle aches, injury Neuro- denies headache, dizziness, syncope, seizure activity      Objective:   Physical Exam GEN- NAD, alert and oriented x3 HEENT- PERRL, EOMI, non injected sclera, pink conjunctiva, MMM, oropharynx clear Neck-normal ROM CVS- RRR, no murmur RESP-CTAB Back- TTP thoracic and lumbar spine, equivical SLR left side, neg on Right side, pain with walking on toes, able to walk on heels, discomfort with squat, flexion and extension, + spasm and stiffness in lower back Neuro- CNII-XII in tact, sensation grossly in tact, motor in tact and strength equal bilat lower ext  EXT- No edema Pulses- Radial, DP- 2+ Psych- normal affect and Mood, no hallucinatons, no SI, normal thought  process Skin-hyperpigemented , slightly raised rash in creases of groin with flaking skin extending to inner thigh, no papular lesions noted       Assessment & Plan:

## 2011-12-07 NOTE — Patient Instructions (Addendum)
For your back- given shot Depo Medrol 40mg   Flexeril three times a day as needed for back pain, Continue ibuprofen 800mg  For your nerves- take Klonopin at time of anxiety attack, start low dose prozac.  Ambien for sleep as needed F/U 4 weeks with Dr. Lodema Hong

## 2011-12-09 ENCOUNTER — Encounter: Payer: Self-pay | Admitting: Family Medicine

## 2011-12-09 DIAGNOSIS — F5101 Primary insomnia: Secondary | ICD-10-CM | POA: Insufficient documentation

## 2011-12-09 DIAGNOSIS — G4700 Insomnia, unspecified: Secondary | ICD-10-CM | POA: Insufficient documentation

## 2011-12-09 DIAGNOSIS — F4321 Adjustment disorder with depressed mood: Secondary | ICD-10-CM | POA: Insufficient documentation

## 2011-12-09 DIAGNOSIS — L309 Dermatitis, unspecified: Secondary | ICD-10-CM | POA: Insufficient documentation

## 2011-12-09 NOTE — Assessment & Plan Note (Signed)
Start low dose SSRI, given 10 tablets of klonopin for panic attacks as a bridge for SSRI to take effect , contracted for saftey

## 2011-12-09 NOTE — Assessment & Plan Note (Signed)
She has known GAD however this is being overcasted by her grief with her grandmothers passing whom she was really close to.

## 2011-12-09 NOTE — Assessment & Plan Note (Signed)
She has a dermatitis between the groin, typically this is yeast but does not look like classis intertrigo, steroid and antifungal

## 2011-12-09 NOTE — Assessment & Plan Note (Signed)
Short course of ambien

## 2011-12-09 NOTE — Assessment & Plan Note (Signed)
Deteriorated, given depo medrol in office, continue NSAIDS, restart flexeril , if no improvement many need repeat imaging

## 2011-12-29 ENCOUNTER — Encounter (HOSPITAL_COMMUNITY): Payer: Self-pay | Admitting: Emergency Medicine

## 2011-12-29 ENCOUNTER — Emergency Department (HOSPITAL_COMMUNITY)
Admission: EM | Admit: 2011-12-29 | Discharge: 2011-12-30 | Disposition: A | Discharge status: Home / Self Care | Payer: Medicaid Other | Attending: Emergency Medicine | Admitting: Emergency Medicine

## 2011-12-29 DIAGNOSIS — M549 Dorsalgia, unspecified: Secondary | ICD-10-CM | POA: Insufficient documentation

## 2011-12-29 DIAGNOSIS — R109 Unspecified abdominal pain: Secondary | ICD-10-CM

## 2011-12-29 LAB — URINALYSIS, ROUTINE W REFLEX MICROSCOPIC
Leukocytes, UA: NEGATIVE
Specific Gravity, Urine: 1.025 (ref 1.005–1.030)
Urobilinogen, UA: 1 mg/dL (ref 0.0–1.0)

## 2011-12-29 LAB — URINE MICROSCOPIC-ADD ON

## 2011-12-29 LAB — POCT PREGNANCY, URINE: Preg Test, Ur: NEGATIVE

## 2011-12-29 NOTE — ED Notes (Signed)
Patient complaining of back pain radiating into right side for over 2 weeks. Denies injury. States she was seen by PCP for back pain earlier this month but pain is worse.

## 2011-12-30 MED ORDER — NITROFURANTOIN MONOHYD MACRO 100 MG PO CAPS: 100.0000 mg | ORAL_CAPSULE | Freq: Two times a day (BID) | ORAL | Status: AC

## 2011-12-30 MED ORDER — NITROFURANTOIN MONOHYD MACRO 100 MG PO CAPS
100.0000 mg | ORAL_CAPSULE | Freq: Once | ORAL | Status: AC
  Administered 2011-12-30: 100 mg via ORAL
  Filled 2011-12-30: qty 1

## 2011-12-30 MED ORDER — PHENAZOPYRIDINE HCL 200 MG PO TABS: 200.0000 mg | ORAL_TABLET | Freq: Three times a day (TID) | ORAL | Status: AC

## 2011-12-30 MED ORDER — PHENAZOPYRIDINE HCL 100 MG PO TABS
200.0000 mg | ORAL_TABLET | Freq: Once | ORAL | Status: AC
  Administered 2011-12-30: 200 mg via ORAL
  Filled 2011-12-30: qty 2

## 2011-12-30 NOTE — ED Provider Notes (Signed)
History     CSN: [id number]  Arrival date & time 12/29/11  2217   First MD Initiated Contact with Patient 12/29/11 2320      Chief Complaint  Patient presents with  . Back Pain    (Consider location/radiation/quality/duration/timing/severity/associated sxs/prior treatment) HPI Comments: R back pain ~ 1 month and R flank pain ~ 2 weeks.  Denies  Any UTI sxs.    No h/o kidney stones.  No fever or chills.  No trauma.  Pain worse with movement and palpation.  Patient is a 31 y.o. female presenting with back pain. The history is provided by the patient. No language interpreter was used.  Back Pain  This is a new problem. The problem occurs constantly. The problem has not changed since onset.The pain is associated with no known injury. The quality of the pain is described as aching. The pain is at a severity of 7/10. The symptoms are aggravated by bending. The pain is the same all the time. Pertinent negatives include no fever, no dysuria, no pelvic pain, no leg pain, no paresthesias, no paresis, no tingling and no weakness.    Past Medical History  Diagnosis Date  . Anxiety   . Obesity   . Asthma     Past Surgical History  Procedure Date  . Tonsillectomy     Family History  Problem Relation Age of Onset  . Hypertension Mother   . Diabetes Mother   . Heart disease Mother   . Diabetes Father   . Cancer Paternal Aunt     breast  . Hypertension Maternal Grandmother     History  Substance Use Topics  . Smoking status: Never Smoker   . Smokeless tobacco: Not on file  . Alcohol Use: No    OB History    Grav Para Term Preterm Abortions TAB SAB Ect Mult Living                  Review of Systems  Constitutional: Negative for fever and chills.  Genitourinary: Positive for flank pain. Negative for dysuria, urgency, frequency, hematuria, vaginal bleeding and pelvic pain.  Musculoskeletal: Positive for back pain.  Neurological: Negative for tingling, weakness and  paresthesias.  All other systems reviewed and are negative.    Allergies  Banana and Pineapple  Home Medications   Current Outpatient Rx  Name Route Sig Dispense Refill  . ALBUTEROL SULFATE HFA 108 (90 BASE) MCG/ACT IN AERS Inhalation Inhale 2 puffs into the lungs every 4 (four) hours as needed for wheezing. 1 Inhaler 2  . BECLOMETHASONE DIPROPIONATE 40 MCG/ACT IN AERS Inhalation Inhale 2 puffs into the lungs 2 (two) times daily. 1 Inhaler 12  . CLONAZEPAM 0.5 MG PO TABS Oral Take 1 tablet (0.5 mg total) by mouth 2 (two) times daily. 10 tablet 0  . CLOTRIMAZOLE-BETAMETHASONE 1-0.05 % EX CREA  Apply to affected area 2 times daily 45 g 1  . CYCLOBENZAPRINE HCL 10 MG PO TABS Oral Take 1 tablet (10 mg total) by mouth every 8 (eight) hours as needed for muscle spasms. 30 tablet 1  . FLUOXETINE HCL 10 MG PO CAPS Oral Take 1 capsule (10 mg total) by mouth daily. 30 capsule 2  . HYDROCHLOROTHIAZIDE 12.5 MG PO CAPS Oral Take 1 capsule (12.5 mg total) by mouth daily. 30 capsule 2  . MEGESTROL ACETATE 40 MG PO TABS Oral Take 40 mg by mouth daily.    Marland Kitchen MOTRIN 800 MG PO TABS  TAKE 1 TABLET EVERY  8 HOURS AS NEEDED FOR PAIN. 30 each 4  . NITROFURANTOIN MONOHYD MACRO 100 MG PO CAPS Oral Take 1 capsule (100 mg total) by mouth 2 (two) times daily. 6 capsule 0  . PHENAZOPYRIDINE HCL 200 MG PO TABS Oral Take 1 tablet (200 mg total) by mouth 3 (three) times daily. 9 tablet 0  . POTASSIUM CHLORIDE 10 MEQ PO TBCR Oral Take 1 tablet (10 mEq total) by mouth daily. 30 tablet 2  . ZOLPIDEM TARTRATE 5 MG PO TABS Oral Take 1 tablet (5 mg total) by mouth at bedtime as needed for sleep. 20 tablet 1    BP 145/89  Pulse 99  Temp 98.2 F (36.8 C) (Oral)  Resp 20  Ht 5\' 5"  (1.651 m)  Wt 240 lb (108.863 kg)  BMI 39.94 kg/m2  SpO2 99%  LMP 11/09/2011  Physical Exam  Nursing note and vitals reviewed. Constitutional: She is oriented to person, place, and time. She appears well-developed and well-nourished. No  distress.  HENT:  Head: Normocephalic and atraumatic.  Eyes: EOM are normal.  Neck: Normal range of motion.  Cardiovascular: Normal rate, regular rhythm and normal heart sounds.   Pulmonary/Chest: Effort normal and breath sounds normal.  Abdominal: Soft. She exhibits no distension. There is no tenderness.  Musculoskeletal: She exhibits tenderness.       Arms: Neurological: She is alert and oriented to person, place, and time.  Skin: Skin is warm and dry.  Psychiatric: She has a normal mood and affect. Judgment normal.    ED Course  Procedures (including critical care time)  Labs Reviewed  URINALYSIS, ROUTINE W REFLEX MICROSCOPIC - Abnormal; Notable for the following:    Hgb urine dipstick MODERATE (*)     All other components within normal limits  URINE MICROSCOPIC-ADD ON - Abnormal; Notable for the following:    Squamous Epithelial / LPF FEW (*)     All other components within normal limits  PREGNANCY, URINE  POCT PREGNANCY, URINE  URINE CULTURE   No results found.   1. Right flank pain       MDM  discussed  The possibility of this being a kidney stone, however the pt does not appear to be in extreme pain and prefers to wait on a CT scan if the sxs worsen.  She also does not have typical UTI sxs and only 3-6 WBC on u/a micro exam.  She agrees with taking macrobid and pyridium for several days and f/u with dr. Lodema Hong.  She will return to the ED if her sxs worsen in the meantime.       Evalina Field, [place] 01/17/12 1328

## 2011-12-30 NOTE — ED Notes (Signed)
Pt stable with steady ambulatory gait Pt instructed to finish all medications. Pt verbalizes understanding Questions addressed and answered

## 2011-12-30 NOTE — ED Notes (Signed)
Neuro WNL...

## 2012-01-11 ENCOUNTER — Ambulatory Visit (INDEPENDENT_AMBULATORY_CARE_PROVIDER_SITE_OTHER): Payer: Medicaid Other | Admitting: Family Medicine

## 2012-01-11 ENCOUNTER — Encounter: Payer: Self-pay | Admitting: Family Medicine

## 2012-01-11 VITALS — BP 116/74 | HR 72 | Resp 18 | Wt 244.1 lb

## 2012-01-11 DIAGNOSIS — Z23 Encounter for immunization: Secondary | ICD-10-CM

## 2012-01-11 DIAGNOSIS — M549 Dorsalgia, unspecified: Secondary | ICD-10-CM

## 2012-01-11 DIAGNOSIS — R319 Hematuria, unspecified: Secondary | ICD-10-CM

## 2012-01-11 DIAGNOSIS — R3129 Other microscopic hematuria: Secondary | ICD-10-CM

## 2012-01-11 DIAGNOSIS — F4321 Adjustment disorder with depressed mood: Secondary | ICD-10-CM

## 2012-01-11 DIAGNOSIS — F411 Generalized anxiety disorder: Secondary | ICD-10-CM

## 2012-01-11 LAB — POCT URINALYSIS DIPSTICK
Bilirubin, UA: NEGATIVE
Ketones, UA: NEGATIVE
Nitrite, UA: NEGATIVE
Spec Grav, UA: 1.025
pH, UA: 7

## 2012-01-11 LAB — POCT URINE PREGNANCY: Preg Test, Ur: NEGATIVE

## 2012-01-11 MED ORDER — FLUOXETINE HCL 20 MG PO CAPS: 10.0000 mg | ORAL_CAPSULE | Freq: Every day | ORAL | Status: DC

## 2012-01-11 MED ORDER — HYDROCODONE-ACETAMINOPHEN 5-500 MG PO TABS: 1.0000 | ORAL_TABLET | Freq: Every day | ORAL | Status: DC

## 2012-01-11 NOTE — Patient Instructions (Addendum)
Increase the Prozac to 20mg  You will be set up for MRI of back  Vicodin prescribed for severe pain F/U  8 weeks with Dr. Lodema Hong

## 2012-01-13 DIAGNOSIS — R3129 Other microscopic hematuria: Secondary | ICD-10-CM | POA: Insufficient documentation

## 2012-01-13 NOTE — Progress Notes (Signed)
  Subjective:    Patient ID: [name], female    DOB: 01-Dec-1980, 31 y.o.   MRN: [id number]  HPI Pt seen 4 weeks ago for anxiety and insomnia coming off recent passing of her grandmother. She feels meds are helping but may need to be increased, she finds her self "snapping" at her son  And yelling, still overwhelmed. Klonopin  has helped panic attacks. Her back also continues to give her a lot of pain and she has pain going into bilat buttocks more often . She was seen in ED for back pain, treated for UTI secondary to hematuria ??, assymptomatic, pain currently feels like her chronic pain. No change to bowel and bladder.    Review of Systems   GEN- denies fatigue, fever, weight loss,weakness, recent illness HEENT- denies eye drainage, change in vision, nasal discharge, CVS- denies chest pain, palpitations RESP- denies SOB, cough, wheeze ABD- denies N/V, change in stools, abd pain MSK- + joint pain, muscle aches, injury       Objective:   Physical Exam GEN-NAD, alert and oriented x 3 Psych- Normal affect and Mood, no SI Back- TTP lumbar region, no spasm noted, neg SLR,antalgic gait       Assessment & Plan:

## 2012-01-13 NOTE — Assessment & Plan Note (Signed)
Per above, she is coping better with medications

## 2012-01-13 NOTE — Assessment & Plan Note (Signed)
Improved, but still quite symptomatic with her mood swings and panic attacks, increase prozac to 20mg ,continue low dose benzo

## 2012-01-13 NOTE — Assessment & Plan Note (Signed)
Continues to deteriorate, given short course of vicodin, obtain MRI with worsening radicular symptoms

## 2012-01-13 NOTE — Assessment & Plan Note (Signed)
Treated for UTI by ED, found to have mod hematuria, pt was asymptomatic but had back pain. Repeat UA shows trace blood, U preg neg

## 2012-01-15 ENCOUNTER — Other Ambulatory Visit: Payer: Self-pay | Admitting: Adult Health

## 2012-01-15 ENCOUNTER — Other Ambulatory Visit (HOSPITAL_COMMUNITY)
Admission: RE | Admit: 2012-01-15 | Discharge: 2012-01-15 | Disposition: A | Discharge status: Home / Self Care | Payer: Medicaid Other | Source: Ambulatory Visit | Attending: Obstetrics and Gynecology | Admitting: Obstetrics and Gynecology

## 2012-01-15 DIAGNOSIS — Z01419 Encounter for gynecological examination (general) (routine) without abnormal findings: Secondary | ICD-10-CM | POA: Insufficient documentation

## 2012-01-15 DIAGNOSIS — Z113 Encounter for screening for infections with a predominantly sexual mode of transmission: Secondary | ICD-10-CM | POA: Insufficient documentation

## 2012-01-18 ENCOUNTER — Ambulatory Visit (HOSPITAL_COMMUNITY)
Admission: RE | Admit: 2012-01-18 | Discharge: 2012-01-18 | Disposition: A | Discharge status: Home / Self Care | Payer: Medicaid Other | Source: Ambulatory Visit | Attending: Family Medicine | Admitting: Family Medicine

## 2012-01-18 ENCOUNTER — Telehealth: Payer: Self-pay | Admitting: Family Medicine

## 2012-01-18 DIAGNOSIS — M549 Dorsalgia, unspecified: Secondary | ICD-10-CM

## 2012-01-18 DIAGNOSIS — R898 Other abnormal findings in specimens from other organs, systems and tissues: Secondary | ICD-10-CM

## 2012-01-18 DIAGNOSIS — M545 Low back pain, unspecified: Secondary | ICD-10-CM | POA: Insufficient documentation

## 2012-01-18 DIAGNOSIS — M79609 Pain in unspecified limb: Secondary | ICD-10-CM | POA: Insufficient documentation

## 2012-01-18 NOTE — ED Provider Notes (Signed)
Medical screening examination/treatment/procedure(s) were performed by non-physician practitioner and as supervising physician I was immediately available for consultation/collaboration.    Vida Roller, MD 01/18/12 [phone]

## 2012-01-18 NOTE — Telephone Encounter (Signed)
Spoke with pt, obtain CBC with diff, CMET, refer to heme/onc for evaluation

## 2012-01-19 ENCOUNTER — Other Ambulatory Visit: Payer: Self-pay | Admitting: Family Medicine

## 2012-01-20 LAB — CBC WITH DIFFERENTIAL/PLATELET
Basophils Absolute: 0 10*3/uL (ref 0.0–0.1)
Basophils Relative: 0 % (ref 0–1)
Eosinophils Absolute: 0.3 10*3/uL (ref 0.0–0.7)
Hemoglobin: 12.9 g/dL (ref 12.0–15.0)
MCH: 30.1 pg (ref 26.0–34.0)
MCHC: 34.5 g/dL (ref 30.0–36.0)
Monocytes Relative: 4 % (ref 3–12)
Neutrophils Relative %: 70 % (ref 43–77)
RDW: 13.3 % (ref 11.5–15.5)

## 2012-01-20 LAB — COMPREHENSIVE METABOLIC PANEL
Alkaline Phosphatase: 74 U/L (ref 39–117)
BUN: 9 mg/dL (ref 6–23)
Glucose, Bld: 85 mg/dL (ref 70–99)
Sodium: 139 mEq/L (ref 135–145)
Total Bilirubin: 1 mg/dL (ref 0.3–1.2)
Total Protein: 7 g/dL (ref 6.0–8.3)

## 2012-01-21 ENCOUNTER — Telehealth: Payer: Self-pay | Admitting: Family Medicine

## 2012-01-22 ENCOUNTER — Telehealth: Payer: Self-pay | Admitting: Family Medicine

## 2012-01-22 NOTE — Telephone Encounter (Signed)
Appointments for Dr. Mariel Sleet and the place of [facility]  And made Tammy aware  that Medicaid may not pay for 2 appointments also gave her to [facility] [phone] to call to see if she can find out. Told me not to cancel the appointment with Dr. Mariel Sleet.

## 2012-01-22 NOTE — Telephone Encounter (Signed)
Patient aware of results.

## 2012-01-23 ENCOUNTER — Other Ambulatory Visit (HOSPITAL_COMMUNITY): Payer: Self-pay | Admitting: Hematology

## 2012-01-23 DIAGNOSIS — R319 Hematuria, unspecified: Secondary | ICD-10-CM

## 2012-01-23 DIAGNOSIS — M545 Low back pain: Secondary | ICD-10-CM

## 2012-01-23 DIAGNOSIS — M549 Dorsalgia, unspecified: Secondary | ICD-10-CM

## 2012-01-23 NOTE — Telephone Encounter (Signed)
Patient is aware 

## 2012-01-24 ENCOUNTER — Telehealth: Payer: Self-pay | Admitting: Family Medicine

## 2012-01-24 NOTE — Telephone Encounter (Signed)
I spoke with patient. She states that the second appointments because she thought she needed a second opinion. I advised her without a diagnosis there something to get a second opinion on. It appears that the hematology group at [facility] in  [place] is having her do an ultrasound as well as urine tests. I advised her to followup with their office until some diagnoses is given then if she would like a second opinion we could schedule her with Dr. Mariel Sleet. She agreed to this. I do not see a reason to see two hematologist as they both would be  doing an initial workup. Will cancel appt with [facility] heme/onc

## 2012-01-25 ENCOUNTER — Ambulatory Visit (HOSPITAL_COMMUNITY): Payer: Medicaid Other

## 2012-01-25 ENCOUNTER — Other Ambulatory Visit (HOSPITAL_COMMUNITY): Payer: Self-pay | Admitting: Hematology

## 2012-01-25 DIAGNOSIS — M545 Low back pain: Secondary | ICD-10-CM

## 2012-01-25 DIAGNOSIS — M549 Dorsalgia, unspecified: Secondary | ICD-10-CM

## 2012-01-25 DIAGNOSIS — R319 Hematuria, unspecified: Secondary | ICD-10-CM

## 2012-01-25 NOTE — Telephone Encounter (Signed)
noted 

## 2012-01-28 ENCOUNTER — Ambulatory Visit (HOSPITAL_COMMUNITY)
Admission: RE | Admit: 2012-01-28 | Discharge: 2012-01-28 | Disposition: A | Discharge status: Home / Self Care | Payer: Medicaid Other | Source: Ambulatory Visit | Attending: Hematology | Admitting: Hematology

## 2012-01-28 ENCOUNTER — Other Ambulatory Visit (HOSPITAL_COMMUNITY): Payer: Self-pay | Admitting: Hematology

## 2012-01-28 DIAGNOSIS — M549 Dorsalgia, unspecified: Secondary | ICD-10-CM

## 2012-01-28 DIAGNOSIS — M545 Low back pain, unspecified: Secondary | ICD-10-CM | POA: Insufficient documentation

## 2012-01-28 DIAGNOSIS — R319 Hematuria, unspecified: Secondary | ICD-10-CM

## 2012-01-28 DIAGNOSIS — R9389 Abnormal findings on diagnostic imaging of other specified body structures: Secondary | ICD-10-CM | POA: Insufficient documentation

## 2012-01-28 DIAGNOSIS — R109 Unspecified abdominal pain: Secondary | ICD-10-CM | POA: Insufficient documentation

## 2012-02-03 ENCOUNTER — Encounter (HOSPITAL_COMMUNITY): Payer: Self-pay

## 2012-02-03 ENCOUNTER — Emergency Department (HOSPITAL_COMMUNITY)
Admission: EM | Admit: 2012-02-03 | Discharge: 2012-02-03 | Disposition: A | Discharge status: Home / Self Care | Payer: Medicaid Other | Attending: Emergency Medicine | Admitting: Emergency Medicine

## 2012-02-03 DIAGNOSIS — J45909 Unspecified asthma, uncomplicated: Secondary | ICD-10-CM | POA: Insufficient documentation

## 2012-02-03 DIAGNOSIS — M545 Low back pain, unspecified: Secondary | ICD-10-CM | POA: Insufficient documentation

## 2012-02-03 DIAGNOSIS — F411 Generalized anxiety disorder: Secondary | ICD-10-CM | POA: Insufficient documentation

## 2012-02-03 DIAGNOSIS — E669 Obesity, unspecified: Secondary | ICD-10-CM | POA: Insufficient documentation

## 2012-02-03 MED ORDER — KETOROLAC TROMETHAMINE 60 MG/2ML IM SOLN
60.0000 mg | Freq: Once | INTRAMUSCULAR | Status: AC
  Administered 2012-02-03: 60 mg via INTRAMUSCULAR
  Filled 2012-02-03: qty 2

## 2012-02-03 MED ORDER — OXYCODONE-ACETAMINOPHEN 5-325 MG PO TABS
1.0000 | ORAL_TABLET | Freq: Once | ORAL | Status: AC
  Administered 2012-02-03: 1 via ORAL
  Filled 2012-02-03: qty 1

## 2012-02-03 MED ORDER — OXYCODONE-ACETAMINOPHEN 5-325 MG PO TABS: 1.0000 | ORAL_TABLET | ORAL | Status: DC | PRN

## 2012-02-03 NOTE — ED Notes (Signed)
Pt reports having bil leg pain for 3 months, pain worse today, had mri 2 weeks ago, had pelvic ultrasound last week, does not know results.

## 2012-02-04 NOTE — ED Provider Notes (Signed)
History     CSN: [id number]  Arrival date & time 02/03/12  1257   First MD Initiated Contact with Patient 02/03/12 1317      Chief Complaint  Patient presents with  . Leg Pain    (Consider location/radiation/quality/duration/timing/severity/associated sxs/prior treatment) HPI Comments: [name] presents with low back pain which radiates into her bilateral upper thighs, present for the past 3 months,  But with a flare up of pain today.  She is currently undergoing evaluation for this condition by a neurologist with [facility] in [place] and has had an MRI 2 weeks ago showing abnormalities in lower spinal canal.  She reports she also had a pelvic US last week also but does not know the results of this study.  Her pain is waxing and waning,  Sharp and worse with certain movements.  She denies weakness and numbness in her legs and has had no urinary or fecal incontinence or retention.  She has taken flexeril and hydrocodone in the past with some improvement in the symptoms.  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  The history is provided by the patient and a parent.    Past Medical History  Diagnosis Date  . Anxiety   . Obesity   . Asthma     Past Surgical History  Procedure Date  . Tonsillectomy     Family History  Problem Relation Age of Onset  . Hypertension Mother   . Diabetes Mother   . Heart disease Mother   . Diabetes Father   . Cancer Paternal Aunt     breast  . Hypertension Maternal Grandmother     History  Substance Use Topics  . Smoking status: Never Smoker   . Smokeless tobacco: Not on file  . Alcohol Use: No    OB History    Grav Para Term Preterm Abortions TAB SAB Ect Mult Living                  Review of Systems  Constitutional: Negative for fever.  Respiratory: Negative for shortness of breath.   Cardiovascular: Negative for chest pain and leg swelling.  Gastrointestinal: Negative  for abdominal pain, constipation and abdominal distention.  Genitourinary: Negative for dysuria, urgency, frequency, flank pain and difficulty urinating.  Musculoskeletal: Positive for back pain. Negative for joint swelling and gait problem.  Skin: Negative for rash.  Neurological: Negative for weakness and numbness.    Allergies  Banana and Pineapple  Home Medications   Current Outpatient Rx  Name Route Sig Dispense Refill  . CLONAZEPAM 0.5 MG PO TABS Oral Take 1 tablet (0.5 mg total) by mouth 2 (two) times daily. 10 tablet 0  . FLUOXETINE HCL 20 MG PO CAPS Oral Take 1 capsule (20 mg total) by mouth daily. 30 capsule 2    New dose  . HYDROCHLOROTHIAZIDE 12.5 MG PO CAPS Oral Take 12.5 mg by mouth daily.    Marland Kitchen HYDROCODONE-ACETAMINOPHEN 5-500 MG PO TABS Oral Take 1 tablet by mouth at bedtime. 30 tablet 0  . MEGESTROL ACETATE 40 MG PO TABS Oral Take 40 mg by mouth daily.    Marland Kitchen POTASSIUM CHLORIDE 10 MEQ PO TBCR Oral Take 10 mEq by mouth daily.    Marland Kitchen ZOLPIDEM TARTRATE 5 MG PO TABS Oral Take 1 tablet (5 mg total) by mouth at bedtime as needed for sleep. 20 tablet 1  . ALBUTEROL SULFATE HFA 108 (90  BASE) MCG/ACT IN AERS Inhalation Inhale 2 puffs into the lungs every 4 (four) hours as needed for wheezing. 1 Inhaler 2  . BECLOMETHASONE DIPROPIONATE 40 MCG/ACT IN AERS Inhalation Inhale 2 puffs into the lungs 2 (two) times daily. 1 Inhaler 12  . CYCLOBENZAPRINE HCL 10 MG PO TABS Oral Take 1 tablet (10 mg total) by mouth every 8 (eight) hours as needed for muscle spasms. 30 tablet 1  . OXYCODONE-ACETAMINOPHEN 5-325 MG PO TABS Oral Take 1 tablet by mouth every 4 (four) hours as needed for pain. 20 tablet 0    BP 145/106  Pulse 102  Temp 98.1 F (36.7 C) (Oral)  Resp 18  Ht 5\' 5"  (1.651 m)  Wt 240 lb (108.863 kg)  BMI 39.94 kg/m2  SpO2 100%  Physical Exam  Nursing note and vitals reviewed. Constitutional: She appears well-developed and well-nourished.  HENT:  Head: Normocephalic.  Eyes:  Conjunctivae normal are normal.  Neck: Normal range of motion. Neck supple.  Cardiovascular: Normal rate and intact distal pulses.        Pedal pulses normal.  Pulmonary/Chest: Effort normal.  Abdominal: Soft. Bowel sounds are normal. She exhibits no distension and no mass.  Musculoskeletal: Normal range of motion. She exhibits tenderness. She exhibits no edema.       Lumbar back: She exhibits tenderness. She exhibits no swelling, no edema and no spasm.       Arms:      No midline tenderness.  Neurological: She is alert. She has normal strength. She displays no atrophy and no tremor. No sensory deficit. Gait normal.  Reflex Scores:      Patellar reflexes are 2+ on the right side and 2+ on the left side.      Achilles reflexes are 2+ on the right side and 2+ on the left side.      No strength deficit noted in hip and knee flexor and extensor muscle groups.  Ankle flexion and extension intact.  Skin: Skin is warm and dry.  Psychiatric: She has a normal mood and affect.    ED Course  Procedures (including critical care time)  Labs Reviewed - No data to display No results found.   1. Lumbar pain with radiation down both legs       MDM  Prior MRI and [place] reviewed.  Nonspecific hypoattenuation in lower spinal cord.  US unremarkable except for small right ovarian cyst.  Pt was prescribed oxycodone for increased pain relief.  Encouraged heat therapy.  F/u with her pcp and/or the neurologist in [place] who is currently evaluating her for this problem.  No neuro deficit on exam or by history to suggest emergent or surgical presentation.  Also discussed worsened sx that should prompt immediate re-evaluation including distal weakness, bowel/bladder retention/incontinence.              Burgess Amor, [place] 02/04/12 2257

## 2012-02-05 ENCOUNTER — Telehealth: Payer: Self-pay | Admitting: Family Medicine

## 2012-02-05 ENCOUNTER — Other Ambulatory Visit: Payer: Self-pay | Admitting: Family Medicine

## 2012-02-05 MED ORDER — PROMETHAZINE HCL 25 MG PO TABS: 25.0000 mg | ORAL_TABLET | Freq: Three times a day (TID) | ORAL | Status: DC | PRN

## 2012-02-05 NOTE — ED Provider Notes (Signed)
Medical screening examination/treatment/procedure(s) were performed by non-physician practitioner and as supervising physician I was immediately available for consultation/collaboration.   Tripp Goins L Tyonna Talerico, MD 02/05/12 1229 

## 2012-02-05 NOTE — Telephone Encounter (Signed)
Med sent in please let her know

## 2012-02-05 NOTE — Telephone Encounter (Signed)
Ok to call in

## 2012-02-14 ENCOUNTER — Telehealth: Payer: Self-pay | Admitting: Family Medicine

## 2012-02-14 DIAGNOSIS — R3129 Other microscopic hematuria: Secondary | ICD-10-CM

## 2012-02-14 NOTE — Telephone Encounter (Signed)
Referral made,please let pt know

## 2012-02-15 NOTE — Telephone Encounter (Signed)
Called and left message for pt

## 2012-02-18 ENCOUNTER — Telehealth: Payer: Self-pay | Admitting: Family Medicine

## 2012-02-21 NOTE — Telephone Encounter (Signed)
Patient is aware 

## 2012-03-06 ENCOUNTER — Ambulatory Visit (INDEPENDENT_AMBULATORY_CARE_PROVIDER_SITE_OTHER): Payer: Medicaid Other | Admitting: Family Medicine

## 2012-03-06 ENCOUNTER — Encounter: Payer: Self-pay | Admitting: Family Medicine

## 2012-03-06 VITALS — BP 138/82 | HR 90 | Resp 18 | Ht 65.75 in | Wt 255.0 lb

## 2012-03-06 DIAGNOSIS — I10 Essential (primary) hypertension: Secondary | ICD-10-CM

## 2012-03-06 DIAGNOSIS — M549 Dorsalgia, unspecified: Secondary | ICD-10-CM

## 2012-03-06 DIAGNOSIS — F4321 Adjustment disorder with depressed mood: Secondary | ICD-10-CM

## 2012-03-06 DIAGNOSIS — E669 Obesity, unspecified: Secondary | ICD-10-CM

## 2012-03-06 DIAGNOSIS — F4320 Adjustment disorder, unspecified: Secondary | ICD-10-CM

## 2012-03-06 DIAGNOSIS — F411 Generalized anxiety disorder: Secondary | ICD-10-CM

## 2012-03-06 MED ORDER — METHOCARBAMOL 500 MG PO TABS: 500.0000 mg | ORAL_TABLET | Freq: Three times a day (TID) | ORAL | Status: DC

## 2012-03-06 MED ORDER — OXYCODONE-ACETAMINOPHEN 5-325 MG PO TABS: 1.0000 | ORAL_TABLET | ORAL | Status: AC | PRN

## 2012-03-06 NOTE — Patient Instructions (Addendum)
New muscle relaxant Robaxin, pain medication refill Physical Therapy Continue all other medications F/U with urology F/U 2 months for mood and back

## 2012-03-07 ENCOUNTER — Ambulatory Visit: Payer: Medicaid Other | Admitting: Family Medicine

## 2012-03-09 ENCOUNTER — Encounter: Payer: Self-pay | Admitting: Family Medicine

## 2012-03-09 NOTE — Assessment & Plan Note (Signed)
Continue HCTZ, BP looks good

## 2012-03-09 NOTE — Assessment & Plan Note (Signed)
Increased weight gain

## 2012-03-09 NOTE — Assessment & Plan Note (Signed)
MRI unremarkable, her weight is making pain worse. Will give new muscle relaxant for bedtime Refer for physical therapy Discussed need for weight loss

## 2012-03-09 NOTE — Assessment & Plan Note (Addendum)
Much improved, on SSRI, would recheck in next 2-3 months and see if pt could start to come off SSRI She is coming to terms with loss of her grandmother

## 2012-03-09 NOTE — Progress Notes (Signed)
  Subjective:    Patient ID: [name], female    DOB: [birth year], 31 y.o.   MRN: [id number]  HPI  Pt here to f/u chronic medical problems. 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 Flexeril did not help as muscle relaxant  Depresssion/GAD- doing well on prozac, does not use klonopin   Review of Systems  GEN- denies fatigue, fever, weight loss,weakness, recent illness HEENT- denies eye drainage, change in vision, nasal discharge, CVS- denies chest pain, palpitations RESP- denies SOB, cough, wheeze ABD- denies N/V, change in stools, abd pain GU- denies dysuria, hematuria, dribbling, incontinence MSK- d+ joint pain, +muscle aches, injury Neuro- denies headache, dizziness, syncope, seizure activity      Objective:   Physical Exam  GEN- NAD, alert and oriented x3, obese HEENT- PERRL, EOMI, non injected sclera, pink conjunctiva, MMM, oropharynx clear Neck- Supple, no thryomegaly CVS- RRR, no murmur RESP-CTAB ABD-NABS,soft,NT,ND Back- TTP lumbar region, + SLR right side Neuro- No focal deficits, strength equal bilat lower ext, sensation grossly in tact EXT- No edema Pulses- Radial, DP- 2+ Psych- Not overly depressed or anxious appearing, PHQ-9 score 3          Assessment & Plan:

## 2012-03-09 NOTE — Assessment & Plan Note (Signed)
Improved, continue SSRI

## 2012-04-18 ENCOUNTER — Encounter: Payer: Self-pay | Admitting: Family Medicine

## 2012-04-18 ENCOUNTER — Ambulatory Visit (INDEPENDENT_AMBULATORY_CARE_PROVIDER_SITE_OTHER): Payer: Medicaid Other | Admitting: Family Medicine

## 2012-04-18 ENCOUNTER — Telehealth: Payer: Self-pay | Admitting: Family Medicine

## 2012-04-18 VITALS — BP 124/80 | HR 97 | Resp 18 | Ht 65.75 in | Wt 254.0 lb

## 2012-04-18 DIAGNOSIS — M549 Dorsalgia, unspecified: Secondary | ICD-10-CM

## 2012-04-18 DIAGNOSIS — G8929 Other chronic pain: Secondary | ICD-10-CM

## 2012-04-18 DIAGNOSIS — F411 Generalized anxiety disorder: Secondary | ICD-10-CM

## 2012-04-18 DIAGNOSIS — R12 Heartburn: Secondary | ICD-10-CM

## 2012-04-18 DIAGNOSIS — G4700 Insomnia, unspecified: Secondary | ICD-10-CM

## 2012-04-18 DIAGNOSIS — N644 Mastodynia: Secondary | ICD-10-CM

## 2012-04-18 MED ORDER — ZOLPIDEM TARTRATE 5 MG PO TABS: 5.0000 mg | ORAL_TABLET | Freq: Every evening | ORAL | Status: DC | PRN

## 2012-04-18 MED ORDER — RANITIDINE HCL 150 MG PO TABS: 150.0000 mg | ORAL_TABLET | Freq: Every day | ORAL | Status: DC

## 2012-04-18 NOTE — Assessment & Plan Note (Signed)
Reviewed ultrasound and normal exam. Possibly related to her recent menstrual cycle.

## 2012-04-18 NOTE — Patient Instructions (Addendum)
Start zantac at bedtime  Continue prozac at current dose [name] New PT referral F/U 3 months Dr. Lodema Hong [name]

## 2012-04-18 NOTE — Assessment & Plan Note (Signed)
Restart Ambien 

## 2012-04-18 NOTE — Progress Notes (Signed)
  Subjective:    Patient ID: [name], female    DOB: 01/17/1981, 31 y.o.   MRN: [id number]  HPI  Patient presents with pain in her left breast. She states she felt a nodule but it is now gone. She does have a history of enlarged milk ducts which she gets some galactorrhea. She had an ultrasound done in January 2013 which was benign. She's on Megace and her last menstrual period was last week. She denies any bloody discharge from nipple. She's also had heartburn feeling in the back of her throat. She will belch and feels some of the acid,. This makes her a little nauseous. She denies any abdominal pain. Her mood has been okay except for last week she has felt a little stressed and gets shaky at times. She continues to have difficulty sleeping in his out of the Ambien. She has not started PT  Review of Systems  GEN- denies fatigue, fever, weight loss,weakness, recent illness HEENT- denies eye drainage, change in vision, nasal discharge, CVS- denies chest pain, palpitations RESP- denies SOB, cough, wheeze ABD- denies N/V, change in stools, abd pain GU- denies dysuria, hematuria, dribbling, incontinence MSK- denies joint pain, muscle aches, injury Neuro- denies headache, dizziness, syncope, seizure activity      Objective:   Physical Exam GEN- NAD, alert and oriented, HEENT-PERRL,EOMI, Non icteric Breast- normal symmetry, no nipple inversion,no nipple drainage, no nodules or lumps felt Nodes- no axillary nodes CVS-RRR, no murmur RESP-CTAB ABD-NABS,soft,NT,ND Psych- Normal affect and mood, not axxious appearing   PHQ-9- Score 4     Assessment & Plan:

## 2012-04-18 NOTE — Assessment & Plan Note (Signed)
She is doing well, some increased stress this week, will not start taper off at this time

## 2012-04-18 NOTE — Assessment & Plan Note (Signed)
Trial of zantac at bedtime 

## 2012-04-21 NOTE — Telephone Encounter (Signed)
Patient is aware 

## 2012-04-29 ENCOUNTER — Ambulatory Visit (HOSPITAL_COMMUNITY)
Admission: RE | Admit: 2012-04-29 | Discharge: 2012-04-29 | Disposition: A | Discharge status: Home / Self Care | Payer: Medicaid Other | Source: Ambulatory Visit | Attending: Family Medicine | Admitting: Family Medicine

## 2012-04-29 DIAGNOSIS — M549 Dorsalgia, unspecified: Secondary | ICD-10-CM | POA: Insufficient documentation

## 2012-04-29 DIAGNOSIS — R262 Difficulty in walking, not elsewhere classified: Secondary | ICD-10-CM | POA: Insufficient documentation

## 2012-04-29 DIAGNOSIS — IMO0001 Reserved for inherently not codable concepts without codable children: Secondary | ICD-10-CM | POA: Insufficient documentation

## 2012-04-29 DIAGNOSIS — M6281 Muscle weakness (generalized): Secondary | ICD-10-CM | POA: Insufficient documentation

## 2012-04-29 DIAGNOSIS — M79609 Pain in unspecified limb: Secondary | ICD-10-CM | POA: Insufficient documentation

## 2012-04-29 NOTE — Evaluation (Signed)
Physical Therapy Evaluation  Patient Details  Name: [name] MRN: [id number] Date of Birth: [birth year]  Today's Date: 04/29/2012 Time: 1110-1148 PT Time Calculation (min): 38 min  Visit#: 1  of 4    Authorization: medicaid   Past Medical History:  Past Medical History  Diagnosis Date  . Anxiety   . Obesity   . Asthma   . Autoimmune disease, not elsewhere classified 2013    Non specific- [facility], abnormal Bone Marrow signal   Past Surgical History:  Past Surgical History  Procedure Date  . Tonsillectomy     Subjective Symptoms/Limitations Symptoms: [name] states that she has had back pain that would come and go for years but since she took a trip to [place] she has had constant pain.  She states the pain is equal on the right and left and states that she has B LE pain that radiates into her calfs.  She states that the pain down her legs occurs simutaneously.   How long can you sit comfortably?: 15-20 minutes How long can you stand comfortably?: 10 minutes How long can you walk comfortably?: 5 minutes. Patient Stated Goals: Less pain Pain Assessment Currently in Pain?: Yes Pain Score:   7 (worst 20/10; best 3/10) Pain Location: Back Pain Orientation: Right;Left Pain Type: Chronic pain Pain Relieving Factors: nothing permanent;  mm relaxer does not help; pain meds temporary relief.   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 Leisure: Hobbies-no  Cognition/Observation Cognition Overall Cognitive Status: Appears within functional limits for tasks assessed   Assessment RLE Strength Right Hip Flexion: 4/5 Right Hip Extension: 3-/5 Right Hip ABduction: 5/5 Right Knee Flexion: 3+/5 Right Knee Extension: 4/5 Right Ankle Dorsiflexion: 4/5 LLE Strength Left Hip Flexion: 4/5 Left Hip Extension: 3-/5 Left Hip ABduction: 5/5 Left Knee Flexion: 3/5 Left Knee Extension:  (4+) Left  Ankle Dorsiflexion: 5/5 Lumbar AROM Lumbar Flexion: decreased 50% w/increased pain  Lumbar Extension: wnl reps no change Lumbar - Right Side Bend: decreased 30 % Lumbar - Left Side Bend: wnl Lumbar - Right Rotation: wnl Lumbar - Left Rotation: wnl  Exercise/Treatments Mobility/Balance  Posture/Postural Control Posture/Postural Control: Postural limitations Postural Limitations: decreased kyphosis increased lordosis   Stretches Active Hamstring Stretch: 3 reps;30 seconds Supine Ab Set: 10 reps Bridge: 10 reps Other Supine Lumbar Exercises: pelvic tilt x 10    Physical Therapy Assessment and Plan PT Assessment and Plan Clinical Impression Statement: Pt with weak core mm and radicular back pain affecting activity tolerance who will benefit from skilled physical therapy to decrease sx and improve functional level. Pt will benefit from skilled therapeutic intervention in order to improve on the following deficits: Decreased activity tolerance;Pain;Decreased strength;Decreased range of motion;Difficulty walking Rehab Potential: Fair PT Frequency: Min 2X/week PT Duration: 4 weeks PT Treatment/Interventions: Therapeutic exercise;Patient/family education;Modalities PT Plan: begin bent knee raise; floating SLR; t-band for posture and IP TX next treatment; 3rd treatment begin prone ex; 4th go over functinal squat and proper body mechanics for lifting.    Goals Home Exercise Program Pt will Perform Home Exercise Program: Independently PT Short Term Goals Time to Complete Short Term Goals: 2 weeks PT Short Term Goal 1: Able to sit for 40 minutes PT Short Term Goal 2: able to stand for 20 minutes to be able to make a meal PT Short Term Goal 3: Pt to be able to walk for 15 minutes without increased pain  Problem List Patient Active Problem List  Diagnosis  . OBESITY  . Generalized anxiety disorder  . GALACTORRHEA  . BACK PAIN, CHRONIC  . FATIGUE  . HEADACHE  . Hypertension  .  Tenosynovitis  . Allergic rhinitis  . Muscle spasm  . Right wrist pain  . Asthma  . Breast discharge  . Dermatitis  . Insomnia  . Grief reaction  . Microscopic hematuria  . Breast pain  . Heartburn    PT - End of Session Activity Tolerance: Patient tolerated treatment well General Behavior During Session: [facility] for tasks performed PT Plan of Care PT Home Exercise Plan: given  GP    Tasha Diaz,CINDY 04/29/2012, 3:48 PM  Physician Documentation Your signature is required to indicate approval of the treatment plan as stated above.  Please sign and either send electronically or make a copy of this report for your files and return this physician signed original.   Please mark one 1.__approve of plan  2. ___approve of plan with the following conditions.   ______________________________                                                          _____________________ Physician Signature                                                                                                             Date

## 2012-04-29 NOTE — Evaluation (Signed)
Agree with above 

## 2012-05-06 ENCOUNTER — Ambulatory Visit (HOSPITAL_COMMUNITY): Payer: Medicaid Other

## 2012-05-08 ENCOUNTER — Ambulatory Visit (HOSPITAL_COMMUNITY)
Admission: RE | Admit: 2012-05-08 | Discharge: 2012-05-08 | Disposition: A | Discharge status: Home / Self Care | Payer: Medicaid Other | Source: Ambulatory Visit | Attending: Family Medicine | Admitting: Family Medicine

## 2012-05-08 DIAGNOSIS — M6281 Muscle weakness (generalized): Secondary | ICD-10-CM | POA: Insufficient documentation

## 2012-05-08 DIAGNOSIS — M549 Dorsalgia, unspecified: Secondary | ICD-10-CM | POA: Insufficient documentation

## 2012-05-08 DIAGNOSIS — M79609 Pain in unspecified limb: Secondary | ICD-10-CM | POA: Insufficient documentation

## 2012-05-08 DIAGNOSIS — IMO0001 Reserved for inherently not codable concepts without codable children: Secondary | ICD-10-CM | POA: Insufficient documentation

## 2012-05-08 DIAGNOSIS — R262 Difficulty in walking, not elsewhere classified: Secondary | ICD-10-CM | POA: Insufficient documentation

## 2012-05-08 NOTE — Progress Notes (Signed)
Physical Therapy Treatment Patient Details  Name: [name] MRN: [id number] Date of Birth: 1980/12/18  Today's Date: 05/08/2012 Time: 4098-1191 PT Time Calculation (min): 57 min  Visit#: 2  of 4   Re-eval:    Charge: traction 17'. manual 8', therex 30'   Authorization: Medicaid  Authorization Time Period: Prior Authorized  05/01/2012-05/29/2012   Authorization Visit#: 2  of 4    Subjective: Symptoms/Limitations Symptoms: Pt stated compliance with HEP, low back pain scale 3/10 today. Pain Assessment Currently in Pain?: Yes Pain Score:   3 Pain Location: Back Pain Orientation: Lower  Objective:   Exercise/Treatments Stretches Active Hamstring Stretch: 3 reps;30 seconds;Limitations Active Hamstring Stretch Limitations: BLE Supine Ab Set: 10 reps;5 seconds;Limitations AB Set Limitations: with tactile cueing for TrA and PFC Bent Knee Raise: 5 reps;5 seconds Bridge: 10 reps;3 seconds Other Supine Lumbar Exercises: pelvic tilt x 10  Modalities Modalities: Traction Manual Therapy Manual Therapy: Other (comment) Other Manual Therapy: MET for SI- pubic clearing x 8 min Traction Type of Traction: Lumbar Min (lbs): 65 Max (lbs): 120 Hold Time: 45" Rest Time: 15" Time: 17'  Physical Therapy Assessment and Plan PT Assessment and Plan Clinical Impression Statement: Began OPPT treatment for posture strenghtening and core strengthening per PT POC.  Began session with MET for SI pubic clearing for improved pelvic alignment for improved hip mobility and pain reduction.  Pt able to demonstrate appropriate technique with all therex with min tactile cueing required for proper core musculature activation and form with new exercises.  Ended session with intermittent lumbar traction for pain relief and to reduce radicular symptoms per PT POC, pt encouraged to drink extra water to prevent headaches with new modality, PT Plan: Assess pain following this session with traction.  Next  session begin prone ex; 4th go over functinal squat and proper body mechanics for lifting    Goals    Problem List Patient Active Problem List  Diagnosis  . OBESITY  . Generalized anxiety disorder  . GALACTORRHEA  . BACK PAIN, CHRONIC  . FATIGUE  . HEADACHE  . Hypertension  . Tenosynovitis  . Allergic rhinitis  . Muscle spasm  . Right wrist pain  . Asthma  . Breast discharge  . Dermatitis  . Insomnia  . Grief reaction  . Microscopic hematuria  . Breast pain  . Heartburn    PT - End of Session Activity Tolerance: Patient tolerated treatment well General Behavior During Session: [facility] for tasks performed Cognition: [facility] for tasks performed  GP    Juel Burrow 05/08/2012, 12:17 PM

## 2012-05-13 ENCOUNTER — Ambulatory Visit (HOSPITAL_COMMUNITY)
Admission: RE | Admit: 2012-05-13 | Discharge: 2012-05-13 | Disposition: A | Discharge status: Home / Self Care | Payer: Medicaid Other | Source: Ambulatory Visit | Attending: Family Medicine | Admitting: Family Medicine

## 2012-05-13 ENCOUNTER — Ambulatory Visit: Payer: Medicaid Other | Admitting: Family Medicine

## 2012-05-13 NOTE — Progress Notes (Signed)
Physical Therapy Treatment Patient Details  Name: [name] MRN: [id number] Date of Birth: 11-Jun-1980  Today's Date: 05/13/2012 Time: 1020- 11:34 Time: 74 minutes  Charges: 1 Traction, 40' TE  Visit#: 3  of 4   Re-eval:     Authorization: Medicaid  Authorization Time Period: Prior Authorized  05/01/2012-05/29/2012   Authorization Visit#: 3  of 4    Subjective: Symptoms/Limitations Symptoms: Pt reports that she is doing well with the exercises.  her pain remains a 3/10.  felt the traction helped   Exercise/Treatments Stretches Prone on Elbows Stretch: 1 rep;2 reps;60 seconds Aerobic Tread Mill: 8' w/cueing for posture Seated Other Seated Lumbar Exercises: heel roll outs 5x10 sec holds Supine Ab Set: 10 reps AB Set Limitations: 10 sec holds w/TC for proper activation Clam: 10 reps;Limitations (BLE) Clam Limitations: w/TrA contraction Bent Knee Raise: 10 reps;Limitations (BLE) Bent Knee Raise Limitations: w/TrA contraction Bridge: 15 reps;Limitations Bridge Limitations: w/TrA contraction Straight Leg Raise: 15 reps;Limitations (BLE) Straight Leg Raises Limitations: W/TrA contraction Other Supine Lumbar Exercises: Pelvic floor contraction 10x10 sec holds Prone  Single Arm Raise: Right;Left;10 reps Straight Leg Raise: 10 reps Opposite Arm/Leg Raise: Right arm/Left leg;Left arm/Right leg;5 reps  Modalities Modalities: Traction Traction Type of Traction: Lumbar Min (lbs): 65 Max (lbs): 120 Hold Time: 45" Rest Time: 15" Time: 17'  Physical Therapy Assessment and Plan PT Assessment and Plan Clinical Impression Statement: added prone activities per PT POC, pt able to complete with significant weakness to multifidus musculature.  pt able to perform SLR with greater ease (per pt statement) and is independent w/TrA contraction.   Pt able to ambulate x8' on TM with minimal increase in pain.  PT Plan:  4th go over functinal squat and proper body mechanics for lifting     Goals Home Exercise Program Pt will Perform Home Exercise Program: Independently PT Short Term Goals Time to Complete Short Term Goals: 2 weeks PT Short Term Goal 1: Able to sit for 40 minutes PT Short Term Goal 1 - Progress: Progressing toward goal (10 minutes, starts to work on posture) PT Short Term Goal 2: able to stand for 20 minutes to be able to make a meal PT Short Term Goal 2 - Progress: Progressing toward goal (15 minutes) PT Short Term Goal 3: Pt to be able to walk for 15 minutes without increased pain PT Short Term Goal 3 - Progress: Progressing toward goal  Problem List Patient Active Problem List  Diagnosis  . OBESITY  . Generalized anxiety disorder  . GALACTORRHEA  . BACK PAIN, CHRONIC  . FATIGUE  . HEADACHE  . Hypertension  . Tenosynovitis  . Allergic rhinitis  . Muscle spasm  . Right wrist pain  . Asthma  . Breast discharge  . Dermatitis  . Insomnia  . Grief reaction  . Microscopic hematuria  . Breast pain  . Heartburn    PT - End of Session Activity Tolerance: Patient tolerated treatment well General Behavior During Session: [facility] for tasks performed Cognition: [facility] for tasks performed PT Plan of Care PT Patient Instructions: discussed importance of body mechanics w/standing and sitting and importance to use core muscles while standing.  Consulted and Agree with Plan of Care: Patient  Satina Jerrell, PT 05/13/2012, 11:07 AM

## 2012-05-15 ENCOUNTER — Ambulatory Visit (HOSPITAL_COMMUNITY)
Admission: RE | Admit: 2012-05-15 | Discharge: 2012-05-15 | Disposition: A | Discharge status: Home / Self Care | Payer: Medicaid Other | Source: Ambulatory Visit | Attending: General Practice | Admitting: General Practice

## 2012-05-15 NOTE — Progress Notes (Signed)
Physical Therapy Discharge Patient Details  Name: NEWELL FRATER MRN: [id number] Date of Birth: Dec 19, 1980  Today's Date: 05/15/2012 Time: 1023-1106 PT Time Calculation (min): 43 min Charges: 35' TE, 8' TA Visit#: 4  of 4   Re-eval:      Authorization: Medicaid  Authorization Time Period: Prior Authorized  05/01/2012-05/29/2012   Authorization Visit#: 4  of 4    Subjective: Symptoms/Limitations Symptoms: Pt states she was a little sore after last session and was able to stand for 15 minutes at her sons practice.  she was concentrating if she was clenching her bottom.  She does not want the traction today.  Exercise/Treatments Stretches Active Hamstring Stretch: 2 reps;30 seconds Lower Trunk Rotation: 5 reps;10 seconds Aerobic Tread Mill: 8' @ 2.0 mph w/cueing for posture Standing Lifting: From floor;From waist;10 reps;Weights;Limitations Lifting Weights (lbs): 8 lb box Other Standing Lumbar Exercises: transfering pt from bed to standing and from STS w/instruction for proper body mechanics and use of body Supine Clam: 10 reps Clam Limitations: w/TrA contraction Bent Knee Raise: 10 reps Bent Knee Raise Limitations: w/TrA contraction Bridge: 15 reps Straight Leg Raise: 10 reps (BLE) Prone  Single Arm Raise: Right;Left;10 reps Opposite Arm/Leg Raise: Right arm/Left leg;Left arm/Right leg;10 reps Other Prone Lumbar Exercises: anterior tilt   Physical Therapy Assessment and Plan PT Assessment and Plan Clinical Impression Statement: Updated HEP with proper lifting and prone activities.  Educated pt on importance of body mechanics to decrease back pain.  Pt feels most comfortably when using a pillow between her torso and pt. and reports decreased back pain with proper transfering mechanics.   At this time will d/c from PT due to limit in insurance.  PT Plan: D/C secondary to insurance    Goals Home Exercise Program Pt will Perform Home Exercise Program: Independently PT  Short Term Goals Time to Complete Short Term Goals: 2 weeks PT Short Term Goal 1: Able to sit for 40 minutes PT Short Term Goal 1 - Progress: Not met (10 minutes) PT Short Term Goal 2: able to stand for 20 minutes to be able to make a meal PT Short Term Goal 2 - Progress: Progressing toward goal (15 minutes) PT Short Term Goal 3: Pt to be able to walk for 15 minutes without increased pain PT Short Term Goal 3 - Progress: Progressing toward goal (10 minutes)  Problem List Patient Active Problem List  Diagnosis  . OBESITY  . Generalized anxiety disorder  . GALACTORRHEA  . BACK PAIN, CHRONIC  . FATIGUE  . HEADACHE  . Hypertension  . Tenosynovitis  . Allergic rhinitis  . Muscle spasm  . Right wrist pain  . Asthma  . Breast discharge  . Dermatitis  . Insomnia  . Grief reaction  . Microscopic hematuria  . Breast pain  . Heartburn    PT - End of Session Activity Tolerance: Patient tolerated treatment well General Behavior During Session: [facility] for tasks performed Cognition: [facility] for tasks performed PT Plan of Care PT Home Exercise Plan: updated. PT Patient Instructions: body mechanics and importance of being close to objects to lift.  Consulted and Agree with Plan of Care: Patient  GP    Vester Titsworth 05/15/2012, 11:23 AM

## 2012-07-17 ENCOUNTER — Encounter: Payer: Self-pay | Admitting: Family Medicine

## 2012-07-17 ENCOUNTER — Ambulatory Visit (INDEPENDENT_AMBULATORY_CARE_PROVIDER_SITE_OTHER): Payer: Medicaid Other | Admitting: Family Medicine

## 2012-07-17 VITALS — BP 124/82 | HR 83 | Resp 16 | Ht 65.75 in | Wt 257.8 lb

## 2012-07-17 DIAGNOSIS — M549 Dorsalgia, unspecified: Secondary | ICD-10-CM

## 2012-07-17 DIAGNOSIS — J45909 Unspecified asthma, uncomplicated: Secondary | ICD-10-CM

## 2012-07-17 DIAGNOSIS — I10 Essential (primary) hypertension: Secondary | ICD-10-CM

## 2012-07-17 DIAGNOSIS — R7301 Impaired fasting glucose: Secondary | ICD-10-CM

## 2012-07-17 DIAGNOSIS — E669 Obesity, unspecified: Secondary | ICD-10-CM

## 2012-07-17 DIAGNOSIS — E559 Vitamin D deficiency, unspecified: Secondary | ICD-10-CM

## 2012-07-17 DIAGNOSIS — R5383 Other fatigue: Secondary | ICD-10-CM

## 2012-07-17 DIAGNOSIS — F411 Generalized anxiety disorder: Secondary | ICD-10-CM

## 2012-07-17 LAB — BASIC METABOLIC PANEL
CO2: 27 mEq/L (ref 19–32)
Chloride: 108 mEq/L (ref 96–112)
Sodium: 141 mEq/L (ref 135–145)

## 2012-07-17 MED ORDER — CYCLOBENZAPRINE HCL 10 MG PO TABS: ORAL_TABLET | ORAL | Status: AC

## 2012-07-17 MED ORDER — KETOROLAC TROMETHAMINE 60 MG/2ML IJ SOLN
60.0000 mg | Freq: Once | INTRAMUSCULAR | Status: AC
  Administered 2012-07-17: 60 mg via INTRAMUSCULAR

## 2012-07-17 MED ORDER — HYDROCHLOROTHIAZIDE 12.5 MG PO CAPS: 12.5000 mg | ORAL_CAPSULE | Freq: Every day | ORAL | Status: DC

## 2012-07-17 MED ORDER — GABAPENTIN 100 MG PO CAPS: ORAL_CAPSULE | ORAL | Status: DC

## 2012-07-17 MED ORDER — PHENTERMINE HCL 37.5 MG PO TABS: 37.5000 mg | ORAL_TABLET | Freq: Every day | ORAL | Status: DC

## 2012-07-17 MED ORDER — VENLAFAXINE HCL ER 37.5 MG PO CP24: 37.5000 mg | ORAL_CAPSULE | Freq: Every day | ORAL | Status: DC

## 2012-07-17 MED ORDER — POTASSIUM CHLORIDE ER 10 MEQ PO TBCR: 10.0000 meq | EXTENDED_RELEASE_TABLET | Freq: Every day | ORAL | Status: DC

## 2012-07-17 NOTE — Patient Instructions (Addendum)
F/u in 2 month  Toradol 60mg  IM today for back pain.  Weight loss and water exercise will help a lot.  Flexeril and gabapentin prescribed for back pain  hBA1C today, also chem 7 and TSH and Vit d  You need to go to class to learn how to measure food and reduce calories to help with weight loss, you will get info on this  You will get 1500 cal diet, start phentermine HALF daily  New med for depression and anxiety, effexor in place of fluoxetine which you have already stopped taking

## 2012-07-17 NOTE — Addendum Note (Signed)
Addended by: Kandis Fantasia B on: 07/17/2012 01:41 PM   Modules accepted: Orders
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
## 2012-07-17 NOTE — Assessment & Plan Note (Signed)
Uncontrolled and worsened as pt gains weight Toradol 60mg  IM , new is gabapentin, and reports better result with flexeril and wants to resume this Weight loss and regular exercise are major players in management plan

## 2012-07-17 NOTE — Assessment & Plan Note (Signed)
Controlled, no change in medication DASH diet and commitment to daily physical activity for a minimum of 30 minutes discussed and encouraged, as a part of hypertension management. The importance of attaining a healthy weight is also discussed.  

## 2012-07-17 NOTE — Assessment & Plan Note (Signed)
Deteriorated. Patient re-educated about  the importance of commitment to a  minimum of 150 minutes of exercise per week. The importance of healthy food choices with portion control discussed. Encouraged to start a food diary, count calories and to consider  joining a support group. Sample diet sheets offered. Goals set by the patient for the next several months.  Pt to start hal;f phentermine daily, and follow 1500 cal diet

## 2012-07-17 NOTE — Assessment & Plan Note (Signed)
Controlled, no change in medication  

## 2012-07-18 ENCOUNTER — Other Ambulatory Visit: Payer: Self-pay | Admitting: Family Medicine

## 2012-07-18 LAB — HEMOGLOBIN A1C: Mean Plasma Glucose: 94 mg/dL (ref <117)

## 2012-07-21 ENCOUNTER — Other Ambulatory Visit: Payer: Self-pay

## 2012-07-21 MED ORDER — ERGOCALCIFEROL 1.25 MG (50000 UT) PO CAPS: 50000.0000 [IU] | ORAL_CAPSULE | ORAL | Status: DC

## 2012-07-23 ENCOUNTER — Encounter (HOSPITAL_COMMUNITY): Payer: Medicaid Other | Attending: Oncology | Admitting: Oncology

## 2012-07-23 ENCOUNTER — Encounter (HOSPITAL_COMMUNITY): Payer: Self-pay | Admitting: Oncology

## 2012-07-23 VITALS — BP 136/99 | HR 128 | Temp 97.9°F | Resp 18 | Ht 65.5 in | Wt 256.7 lb

## 2012-07-23 DIAGNOSIS — G8929 Other chronic pain: Secondary | ICD-10-CM

## 2012-07-23 DIAGNOSIS — E8809 Other disorders of plasma-protein metabolism, not elsewhere classified: Secondary | ICD-10-CM

## 2012-07-23 DIAGNOSIS — M545 Low back pain: Secondary | ICD-10-CM

## 2012-07-23 DIAGNOSIS — D890 Polyclonal hypergammaglobulinemia: Secondary | ICD-10-CM

## 2012-07-23 NOTE — Patient Instructions (Addendum)
[facility] Discharge Instructions  RECOMMENDATIONS MADE BY THE CONSULTANT AND ANY TEST RESULTS WILL BE SENT TO YOUR REFERRING PHYSICIAN.  EXAM FINDINGS BY THE PHYSICIAN TODAY AND SIGNS OR SYMPTOMS TO REPORT TO CLINIC OR PRIMARY PHYSICIAN: Exam and discussion by MD.  [name] have Polyclonal Gammopathy which is a reactive process and not significant at this time.  We will make an nutritional consult to get a dietitian to work with you on a weight reduction diet.  MEDICATIONS PRESCRIBED:  none  INSTRUCTIONS GIVEN AND DISCUSSED: Report recurring infections, night sweats or other problems.  SPECIAL INSTRUCTIONS/FOLLOW-UP: Blood work and MD follow-up in 1 year.  Thank you for choosing [name] to provide your oncology and hematology care.  To afford each patient quality time with our providers, please arrive at least 15 minutes before your scheduled appointment time.  With your help, our goal is to use those 15 minutes to complete the necessary work-up to ensure our physicians have the information they need to help with your evaluation and healthcare recommendations.    Effective January 1st, 2014, we ask that you re-schedule your appointment with our physicians should you arrive 10 or more minutes late for your appointment.  We strive to give you quality time with our providers, and arriving late affects you and other patients whose appointments are after yours.    Again, thank you for choosing [facility].  Our hope is that these requests will decrease the amount of time that you wait before being seen by our physicians.       _____________________________________________________________  Should you have questions after your visit to [facility], please contact our office at [phone] between the hours of 8:30 a.m. and 5:00 p.m.  Voicemails left after 4:30 p.m. will not be returned until the following business day.  For prescription  refill requests, have your pharmacy contact our office with your prescription refill request.

## 2012-07-23 NOTE — Progress Notes (Signed)
Problem #1 polyclonal gammopathy-benign with no obvious cause at this time Problem #2 chronic low back pain with what appears to be mild narrowing of the disc space at L5-S1 Problem #3 obesity with weight gain 30-40 pounds since utilizing progestational agents for uterine bleeding/birth control Problem #4 dizziness Problem # 5 vitamin D deficiency documented recently now on vitamin D Problem #6 probable asthma on albuterol as well as Qvar Problem #7 headaches-NOS  Pleasant woman who was found both in 2008 as well as 2013 on MRI scans of the back to have decreased marrow signal intensity. No workup was done for this in 2008. Workup however was recommended to her. She actually saw Dr. Loraine Leriche slight cough as well as Dr. Wynn Maudlin in [place] for workup. This revealed very nicely a polyclonal gammopathy with no M spike. She had mild elevations in both IgG as well as IgM. Light chains including kappa and lambda light chains also appeared increased.  No further workup was recommended.  She is here today for followup.  She has not lost weight, she has not had symptoms of lupus or other connective tissue disorder. She does not have obvious symptoms of chronic gallbladder disease etc.  She does not smoke.  She lives here in [place] with her young son. She and her significant other have been together 10 years.  The main positive review of systems findings in his chronic low back pain times several years as well as dizziness with the last 6 months intermittently when she stands up.  BP 136/99  Pulse 128  Temp(Src) 97.9 F (36.6 C) (Oral)  Resp 18  Ht 5' 5.5" (1.664 m)  Wt 256 lb 11.2 oz (116.438 kg)  BMI 42.05 kg/m2  Her blood pressure is slightly high today. Pulses also slightly elevated day. After she sat for a while her pulse is down to 88-92 and regular. I did not repeat her blood pressure but recommended she go back and see Dr. Jeanice Lim. She has no obvious lymphadenopathy. She has  multiple tattoos on her arms back just etc. Heart shows a regular rhythm and rate without murmur rub or gallop. Breast exam was deferred to her primary care physician and her gynecologist of both perform these. Her abdomen is obese without obvious hepatosplenomegaly. Bowel sounds are normal. She has no other skin lesions other than the tattoos which are readily visible. She has no leg edema. Pulses are 2+ and symmetric. She is right handed. HEENT exam is remarkable for geographic tongue. Throat is clear. She also no thyromegaly.  I think this lady closely just has a benign polyclonal gammopathy and I think we should see her one more time in a year and if everything is stable release her from this clinic at that time. I have recommended she do ongoing followup with her gynecologist and her primary care physician. She may also benefit from seen Dr. Nickola Major about her chronic back discomfort. She states that she is used exercises and physical therapy in the past to no avail. Nevertheless I think consultation would be in order. Weight reduction would certainly be in order and we will arrange for a dietary consultation.

## 2012-07-24 ENCOUNTER — Telehealth (HOSPITAL_COMMUNITY): Payer: Self-pay | Admitting: Dietician

## 2012-07-24 NOTE — Telephone Encounter (Signed)
Called and left message on pt voicemail at [phone].

## 2012-07-24 NOTE — Telephone Encounter (Signed)
Received referral via [facility] from [facility] (Dr. Mariel Sleet) for dx: obesity, weight reduction.

## 2012-07-25 ENCOUNTER — Telehealth: Payer: Self-pay | Admitting: Family Medicine

## 2012-07-31 NOTE — Telephone Encounter (Signed)
Pt has not responded to previous contact attempt. Final attempt- sent letter to pt home via [place] [employer] in attempt to contact pt to schedule appointment.

## 2012-08-01 ENCOUNTER — Other Ambulatory Visit: Payer: Self-pay | Admitting: Family Medicine

## 2012-08-01 DIAGNOSIS — M549 Dorsalgia, unspecified: Secondary | ICD-10-CM

## 2012-08-01 NOTE — Telephone Encounter (Signed)
pls let pt know I am referring her to Dr Eduard Clos, and refer

## 2012-08-01 NOTE — Telephone Encounter (Signed)
I reviewed his note and he did say that he felt she would benefit from a consult with [facility].

## 2012-08-04 NOTE — Telephone Encounter (Signed)
Pt has not responded to previous contact attempts. Referral filed.  

## 2012-08-05 ENCOUNTER — Telehealth: Payer: Self-pay | Admitting: Family Medicine

## 2012-08-05 NOTE — Telephone Encounter (Signed)
pls call dr Ronal Fear office he does epidural injections also

## 2012-08-06 NOTE — Telephone Encounter (Signed)
Faxed over papers to Dr. Harriett Sine office they will call patient with an appointment patient is aware

## 2012-08-13 ENCOUNTER — Telehealth: Payer: Self-pay | Admitting: Family Medicine

## 2012-08-13 MED ORDER — ZOLPIDEM TARTRATE 5 MG PO TABS: 5.0000 mg | ORAL_TABLET | Freq: Every evening | ORAL | Status: DC | PRN

## 2012-08-13 NOTE — Telephone Encounter (Signed)
papers faxed to Dr. Lacinda Axon office 4.8.2014  Dr. Lacinda Axon office does not take Medicaid and now sent the papers to Dr. Harriett Sine patient is aware and that the office of Dr. Gerilyn Pilgrim will call her with an appointment.  4.9.2014   confirmed that Dr. Harriett Sine office did receive her paper work and will call her with an appointment. 4.16.2014

## 2012-08-13 NOTE — Telephone Encounter (Signed)
medrefilled

## 2012-08-14 ENCOUNTER — Encounter: Payer: Self-pay | Admitting: *Deleted

## 2012-08-18 ENCOUNTER — Encounter: Payer: Self-pay | Admitting: Adult Health

## 2012-08-18 ENCOUNTER — Encounter: Payer: Self-pay | Admitting: *Deleted

## 2012-08-18 ENCOUNTER — Ambulatory Visit (INDEPENDENT_AMBULATORY_CARE_PROVIDER_SITE_OTHER): Payer: Medicaid Other | Admitting: Adult Health

## 2012-08-18 VITALS — BP 104/60 | Ht 65.0 in | Wt 259.0 lb

## 2012-08-18 DIAGNOSIS — N6310 Unspecified lump in the right breast, unspecified quadrant: Secondary | ICD-10-CM

## 2012-08-18 DIAGNOSIS — N63 Unspecified lump in unspecified breast: Secondary | ICD-10-CM

## 2012-08-18 HISTORY — DX: Unspecified lump in the right breast, unspecified quadrant: N63.10

## 2012-08-18 NOTE — Patient Instructions (Addendum)
Get right breast ultrasound 4/23 at 12: 15 pm be there at 12 noon at [facility] Will talk after ultrasound Has my chart already.

## 2012-08-18 NOTE — Assessment & Plan Note (Signed)
Has tender oval mass at 11 o'clock 4 finger breaths from areola in right breast, will get mammogram and ultrasound

## 2012-08-18 NOTE — Progress Notes (Signed)
Subjective:     Patient ID: [name], female   DOB: 1981-03-15, 32 y.o.   MRN: [id number]  HPI [name] is a 32 year old black female in with pain in right breast.She has stopped Megace due to weight gain and she has just spotted with her last 2 periods. She recently started Adipex.   Review of SystemsNo complaints, except pain right breast. Reviewed past medical, surgical, and social and family history. Reviewed medications and allergies.     Objective:   Physical Exam Blood pressure 104/60, height 5\' 5"  (1.651 m), weight 259 lb (117.482 kg), last menstrual period 08/09/2012.Breast: On the left, no dominant mass, retraction or nipple discharge, the right breast has a tender oval mass at 11 o'clock 4 finger breaths from the areola, no retraction or nipple discharge, she says she sees occasional discharge from this nipple, it is like milk.   Skin warm and dry. Pt is alert and cooperative. Assessment:    Right breast mass    Plan:      Right breast ultrasound 4/23 at 12:15 pm at [facility] Will talk after [place]

## 2012-08-20 ENCOUNTER — Ambulatory Visit (HOSPITAL_COMMUNITY)
Admission: RE | Admit: 2012-08-20 | Discharge: 2012-08-20 | Disposition: A | Discharge status: Home / Self Care | Payer: Medicaid Other | Source: Ambulatory Visit | Attending: Adult Health | Admitting: Adult Health

## 2012-08-20 DIAGNOSIS — N644 Mastodynia: Secondary | ICD-10-CM | POA: Insufficient documentation

## 2012-08-20 DIAGNOSIS — N6310 Unspecified lump in the right breast, unspecified quadrant: Secondary | ICD-10-CM

## 2012-08-28 ENCOUNTER — Telehealth: Payer: Self-pay | Admitting: Obstetrics & Gynecology

## 2012-08-28 MED ORDER — FLUCONAZOLE 150 MG PO TABS: ORAL_TABLET | ORAL | Status: DC

## 2012-08-28 NOTE — Telephone Encounter (Signed)
Complains of yeast, Rx. Diflucan 150 mg 1 now and 1 in 3 days if needed at [facility]

## 2012-08-28 NOTE — Telephone Encounter (Signed)
C/o vaginal itching, has tried OTC Monistat with some improvement but still having itching. Can you e-scribe med for yeast infection?

## 2012-09-17 ENCOUNTER — Encounter: Payer: Self-pay | Admitting: Family Medicine

## 2012-09-18 ENCOUNTER — Ambulatory Visit: Payer: Medicaid Other | Admitting: Family Medicine

## 2012-09-19 ENCOUNTER — Other Ambulatory Visit: Payer: Self-pay | Admitting: Family Medicine

## 2012-09-23 ENCOUNTER — Other Ambulatory Visit: Payer: Self-pay

## 2012-09-23 DIAGNOSIS — E669 Obesity, unspecified: Secondary | ICD-10-CM

## 2012-09-23 MED ORDER — PHENTERMINE HCL 37.5 MG PO TABS: 37.5000 mg | ORAL_TABLET | Freq: Every day | ORAL | Status: DC

## 2012-09-24 ENCOUNTER — Ambulatory Visit: Payer: Medicaid Other | Admitting: Family Medicine

## 2012-11-28 ENCOUNTER — Encounter: Payer: Self-pay | Admitting: Adult Health

## 2012-11-28 ENCOUNTER — Ambulatory Visit (INDEPENDENT_AMBULATORY_CARE_PROVIDER_SITE_OTHER): Payer: Medicaid Other | Admitting: Adult Health

## 2012-11-28 VITALS — BP 140/88 | Ht 65.0 in | Wt 260.0 lb

## 2012-11-28 DIAGNOSIS — Z3202 Encounter for pregnancy test, result negative: Secondary | ICD-10-CM

## 2012-11-28 DIAGNOSIS — N63 Unspecified lump in unspecified breast: Secondary | ICD-10-CM | POA: Insufficient documentation

## 2012-11-28 DIAGNOSIS — Z1389 Encounter for screening for other disorder: Secondary | ICD-10-CM

## 2012-11-28 HISTORY — DX: Unspecified lump in unspecified breast: N63.0

## 2012-11-28 NOTE — Patient Instructions (Addendum)
Follow up prn  Call if no period by mid august Get mammogram and [place] 8/13

## 2012-11-28 NOTE — Progress Notes (Signed)
Subjective:     Patient ID: [name], female   DOB: 05-25-1980, 32 y.o.   MRN: [id number]  HPI [name] is a 32 year old black female in complaining of bilateral nipple pain and has not had period for July.   Review of Systems Positives in HPI Reviewed past medical,surgical, social and family history. Reviewed medications and allergies.     Objective:   Physical Exam BP 140/88  Ht 5\' 5"  (1.651 m)  Wt 260 lb (117.935 kg)  BMI 43.27 kg/m2  LMP 10/22/2012   Urine pregnancy test negative.  Skin warm and dry,  Breasts: left:no dominate palpable mass, retraction or nipple discharge, on the right there is a round tender lump at 10-11 o'clock, 4 finger breaths from areola, she had a [place] in April that showed fibroglandular fatty tissue, she has no retraction or nipple discharge.Her breast are large and there is bilateral tenderness in UOQ.  Assessment:      Right breast lump Breast tenderness with bilateral nipple pain   No period in July Plan:     Call if no period by mid August Bilateral diagnostic mammogram and right breast US 8/13 at 8 am at [facility]   Follow up as scheduled.

## 2012-12-10 ENCOUNTER — Ambulatory Visit (HOSPITAL_COMMUNITY)
Admission: RE | Admit: 2012-12-10 | Discharge: 2012-12-10 | Disposition: A | Discharge status: Home / Self Care | Payer: Medicaid Other | Source: Ambulatory Visit | Attending: Adult Health | Admitting: Adult Health

## 2012-12-10 ENCOUNTER — Other Ambulatory Visit: Payer: Self-pay | Admitting: Family Medicine

## 2012-12-10 ENCOUNTER — Other Ambulatory Visit (INDEPENDENT_AMBULATORY_CARE_PROVIDER_SITE_OTHER): Payer: Self-pay

## 2012-12-10 ENCOUNTER — Other Ambulatory Visit: Payer: Self-pay

## 2012-12-10 DIAGNOSIS — N63 Unspecified lump in unspecified breast: Secondary | ICD-10-CM | POA: Insufficient documentation

## 2012-12-10 MED ORDER — BECLOMETHASONE DIPROPIONATE 40 MCG/ACT IN AERS: 2.0000 | INHALATION_SPRAY | Freq: Two times a day (BID) | RESPIRATORY_TRACT | Status: DC

## 2013-01-21 ENCOUNTER — Other Ambulatory Visit: Payer: Medicaid Other | Admitting: Adult Health

## 2013-02-02 ENCOUNTER — Ambulatory Visit: Payer: Medicaid Other | Admitting: Family Medicine

## 2013-02-05 ENCOUNTER — Encounter: Payer: Self-pay | Admitting: Family Medicine

## 2013-02-05 ENCOUNTER — Ambulatory Visit (INDEPENDENT_AMBULATORY_CARE_PROVIDER_SITE_OTHER): Payer: Medicaid Other | Admitting: Family Medicine

## 2013-02-05 VITALS — BP 128/72 | HR 100 | Resp 18 | Ht 65.75 in | Wt 256.1 lb

## 2013-02-05 DIAGNOSIS — E669 Obesity, unspecified: Secondary | ICD-10-CM

## 2013-02-05 DIAGNOSIS — M549 Dorsalgia, unspecified: Secondary | ICD-10-CM | POA: Insufficient documentation

## 2013-02-05 DIAGNOSIS — J45909 Unspecified asthma, uncomplicated: Secondary | ICD-10-CM

## 2013-02-05 DIAGNOSIS — D72829 Elevated white blood cell count, unspecified: Secondary | ICD-10-CM

## 2013-02-05 DIAGNOSIS — Z23 Encounter for immunization: Secondary | ICD-10-CM

## 2013-02-05 DIAGNOSIS — I10 Essential (primary) hypertension: Secondary | ICD-10-CM

## 2013-02-05 DIAGNOSIS — F411 Generalized anxiety disorder: Secondary | ICD-10-CM

## 2013-02-05 DIAGNOSIS — R110 Nausea: Secondary | ICD-10-CM

## 2013-02-05 DIAGNOSIS — Z13220 Encounter for screening for lipoid disorders: Secondary | ICD-10-CM

## 2013-02-05 MED ORDER — KETOROLAC TROMETHAMINE 60 MG/2ML IJ SOLN
60.0000 mg | Freq: Once | INTRAMUSCULAR | Status: AC
  Administered 2013-02-05: 60 mg via INTRAMUSCULAR

## 2013-02-05 MED ORDER — ZOLPIDEM TARTRATE 5 MG PO TABS: 5.0000 mg | ORAL_TABLET | Freq: Every evening | ORAL | Status: DC | PRN

## 2013-02-05 MED ORDER — HYDROCHLOROTHIAZIDE 12.5 MG PO CAPS: 12.5000 mg | ORAL_CAPSULE | Freq: Every day | ORAL | Status: DC

## 2013-02-05 MED ORDER — ONDANSETRON HCL 4 MG/2ML IJ SOLN
4.0000 mg | Freq: Once | INTRAMUSCULAR | Status: AC
  Administered 2013-02-05: 4 mg via INTRAMUSCULAR

## 2013-02-05 MED ORDER — ONDANSETRON HCL 4 MG PO TABS: 4.0000 mg | ORAL_TABLET | Freq: Every day | ORAL | Status: AC | PRN

## 2013-02-05 MED ORDER — ALBUTEROL SULFATE HFA 108 (90 BASE) MCG/ACT IN AERS: 2.0000 | INHALATION_SPRAY | RESPIRATORY_TRACT | Status: DC | PRN

## 2013-02-05 MED ORDER — BECLOMETHASONE DIPROPIONATE 40 MCG/ACT IN AERS: 2.0000 | INHALATION_SPRAY | Freq: Two times a day (BID) | RESPIRATORY_TRACT | Status: DC

## 2013-02-05 NOTE — Progress Notes (Signed)
  Subjective:    Patient ID: [name], female    DOB: 1981-02-02, 32 y.o.   MRN: [id number]  HPI Ongoing uncontrtoled back pain with numbness in both lower extremities , not much response to injections and current pain meds, states tht after this last shot now having pain up in the neck.second shot gave excellent relief for 1 week.Feels nauseated and dizzy when her back pain is severe States no one is telling her what's wrong with her back, unable to stand forover 15 mins due to pain and lower  extremity weakness which is negatively impacting the quality of her life from her ability to work  And even including her ability to adequately care for herself.Denies incontinence of stool or uriine Requests flu vaccine Upcoming pap in next several weeks. Depression symptoms well controlled with effexor at current dose   Review of Systems See HPI Denies recent fever or chills. Denies sinus pressure, nasal congestion, ear pain or sore throat. Denies chest congestion, productive cough or wheezing. Denies chest pains, palpitations and leg swelling Denies abdominal pain, nausea, vomiting,diarrhea or constipation.   Denies dysuria, frequency, hesitancy or incontinence.  Denies headaches, seizures, numbness, or tingling. Denies uncontrolled  depression, anxiety or insomnia. Denies skin break down or rash.        Objective:   Physical Exam  Patient alert and oriented and in no cardiopulmonary distress.  HEENT: No facial asymmetry, EOMI, no sinus tenderness,  oropharynx pink and moist.  Neck supple no adenopathy.  Chest: Clear to auscultation bilaterally.  CVS: S1, S2 no murmurs, no S3.  ABD: Soft non tender.  Ext: No edema  MS: Decreased  ROM spine,adequate in  shoulders, hips and knees.  Skin: Intact, no ulcerations or rash noted.  Psych: Good eye contact, normal affect. Memory intact mildly  anxious or depressed appearing.  CNS: CN 2-12 intact, power, normal in both  lower extremities, pt reports decreased sensation in both lower extremities, right more so than left       Assessment & Plan:

## 2013-02-05 NOTE — Patient Instructions (Addendum)
F/u in January , call if you need me before  Flu vaccine today  You are referred for an mRI of your back, and then I will likely refer you to orthopedic Doc Romeo Apple to explain your back problem.  Fasting lipid, chem 7 and CBC and vitD as soon as possible  Toradol 60mg  Im and zofran 4 mg IM for back pain and nausea  Please work on weight loss to reduce back pain also  No change needed in dose of effexor

## 2013-02-07 NOTE — Assessment & Plan Note (Signed)
Improved on current med continue same 

## 2013-02-07 NOTE — Assessment & Plan Note (Signed)
Controlled, no change in medication  

## 2013-02-07 NOTE — Assessment & Plan Note (Signed)
Unchanged. Patient re-educated about  the importance of commitment to a  minimum of 150 minutes of exercise per week. The importance of healthy food choices with portion control discussed. Encouraged to start a food diary, count calories and to consider  joining a support group. Sample diet sheets offered. Goals set by the patient for the next several months.    

## 2013-02-07 NOTE — Assessment & Plan Note (Addendum)
Progressively worsening , interfering with all aspects of her life, work and home. Reports weakness of lower extremities with inability to stand for over 15 minutes and bilateral lower extremity numbness. Gwenith Daily had 2 epidurals with no improvement in symptoms Will refer for MRI

## 2013-02-07 NOTE — Assessment & Plan Note (Signed)
zofran in office and script sent in for as needed use, nausea is associated with her back pain

## 2013-02-11 ENCOUNTER — Ambulatory Visit (HOSPITAL_COMMUNITY)
Admission: RE | Admit: 2013-02-11 | Discharge: 2013-02-11 | Disposition: A | Discharge status: Home / Self Care | Payer: Medicaid Other | Source: Ambulatory Visit | Attending: Family Medicine | Admitting: Family Medicine

## 2013-02-11 DIAGNOSIS — R937 Abnormal findings on diagnostic imaging of other parts of musculoskeletal system: Secondary | ICD-10-CM | POA: Insufficient documentation

## 2013-02-11 DIAGNOSIS — M549 Dorsalgia, unspecified: Secondary | ICD-10-CM

## 2013-02-11 DIAGNOSIS — R209 Unspecified disturbances of skin sensation: Secondary | ICD-10-CM | POA: Insufficient documentation

## 2013-02-11 DIAGNOSIS — M545 Low back pain, unspecified: Secondary | ICD-10-CM | POA: Insufficient documentation

## 2013-02-11 LAB — CBC
Hemoglobin: 14.1 g/dL (ref 12.0–15.0)
MCH: 30.7 pg (ref 26.0–34.0)
MCV: 91.5 fL (ref 78.0–100.0)
RBC: 4.6 MIL/uL (ref 3.87–5.11)

## 2013-02-12 LAB — BASIC METABOLIC PANEL
CO2: 25 mEq/L (ref 19–32)
Calcium: 9.5 mg/dL (ref 8.4–10.5)
Chloride: 106 mEq/L (ref 96–112)
Glucose, Bld: 94 mg/dL (ref 70–99)
Sodium: 138 mEq/L (ref 135–145)

## 2013-02-12 LAB — LIPID PANEL
Cholesterol: 116 mg/dL (ref 0–200)
LDL Cholesterol: 60 mg/dL (ref 0–99)
Triglycerides: 39 mg/dL (ref <150)

## 2013-02-13 ENCOUNTER — Other Ambulatory Visit: Payer: Self-pay | Admitting: Family Medicine

## 2013-02-13 DIAGNOSIS — G8929 Other chronic pain: Secondary | ICD-10-CM

## 2013-02-13 NOTE — Addendum Note (Signed)
Addended by: Abner Greenspan on: 02/13/2013 03:14 PM   Modules accepted: Orders

## 2013-02-24 ENCOUNTER — Telehealth: Payer: Self-pay | Admitting: Orthopedic Surgery

## 2013-02-24 NOTE — Telephone Encounter (Signed)
Please review Dr. Anthony Sar office note and the MRI report for [facility]  and advise if to schedule here.  Dr. Lodema Hong is referring for back pain.

## 2013-02-25 NOTE — Telephone Encounter (Signed)
Elliot Dally at Dr. Anthony Sar office of Dr. Mort Sawyers reply

## 2013-02-25 NOTE — Telephone Encounter (Signed)
Refer to neurosurgery. 

## 2013-03-09 ENCOUNTER — Telehealth: Payer: Self-pay | Admitting: Family Medicine

## 2013-03-09 NOTE — Telephone Encounter (Signed)
pls refer pr dr Mort Sawyers response to Doc of pt's choice [place], referral is entered

## 2013-03-10 ENCOUNTER — Ambulatory Visit (INDEPENDENT_AMBULATORY_CARE_PROVIDER_SITE_OTHER): Payer: Medicaid Other | Admitting: Family Medicine

## 2013-03-10 VITALS — BP 110/70 | HR 82 | Temp 98.2°F | Resp 20 | Ht 65.5 in | Wt 258.0 lb

## 2013-03-10 DIAGNOSIS — N938 Other specified abnormal uterine and vaginal bleeding: Secondary | ICD-10-CM | POA: Insufficient documentation

## 2013-03-10 DIAGNOSIS — E669 Obesity, unspecified: Secondary | ICD-10-CM

## 2013-03-10 DIAGNOSIS — N925 Other specified irregular menstruation: Secondary | ICD-10-CM

## 2013-03-10 DIAGNOSIS — N949 Unspecified condition associated with female genital organs and menstrual cycle: Secondary | ICD-10-CM

## 2013-03-10 DIAGNOSIS — M549 Dorsalgia, unspecified: Secondary | ICD-10-CM

## 2013-03-10 MED ORDER — HYDROCODONE-ACETAMINOPHEN 5-325 MG PO TABS: 1.0000 | ORAL_TABLET | Freq: Four times a day (QID) | ORAL | Status: DC | PRN

## 2013-03-10 NOTE — Patient Instructions (Signed)
Continue all other medications We will call if abnormal  Referral to neursurgery for back  Pain medication short term F/U 3 months

## 2013-03-11 ENCOUNTER — Encounter: Payer: Self-pay | Admitting: Family Medicine

## 2013-03-11 ENCOUNTER — Telehealth: Payer: Self-pay | Admitting: Family Medicine

## 2013-03-11 LAB — CBC
HCT: 39.4 % (ref 36.0–46.0)
MCH: 31.2 pg (ref 26.0–34.0)
MCHC: 33.5 g/dL (ref 30.0–36.0)
RDW: 13.2 % (ref 11.5–15.5)

## 2013-03-11 NOTE — Telephone Encounter (Signed)
Fine with me

## 2013-03-11 NOTE — Progress Notes (Signed)
  Subjective:    Patient ID: [name], female    DOB: 11/01/1980, 32 y.o.   MRN: [id number]  HPI  Patient here to followup back pain. Of note she was seen by her PCP a few weeks ago secondary to back pain. I was previously a physician in the office with her PCP was last seen this patient a couple times. She's decided to transfer her care to [facility]. She has history of lower back pain since she was a teenager. She had an MRI last year which showed an abnormal signal therefore she was evaluated by hematology and oncology this was thought to be more of an over read and do to her weight. She continues to have pain and weakness in her lower back which radiates down her right side. She's been seen by neurology in the past couple months and has had some nerve conduction studies done. She's currently maintained on gabapentin as well as a back brace. She also had epidural injections which did not help. She fell that her weakness was worsening therefore she had a repeat MRI done a couple weeks ago  Very heavy period past 2 weeks, have been irregular, worried about her hb as she is always tired. Has appt to see GYN this week   Review of Systems  GEN- denies fatigue, fever, weight loss,weakness, recent illness HEENT- denies eye drainage, change in vision, nasal discharge, CVS- denies chest pain, palpitations RESP- denies SOB, cough, wheeze ABD- denies N/V, change in stools, abd pain GU- denies dysuria, hematuria, dribbling, incontinence MSK- + joint pain, muscle aches, injury Neuro- denies headache, dizziness, syncope, seizure activity      Objective:   Physical Exam GEN- NAD, alert and oriented x3 HEENT- PERRL, EOMI, non injected sclera, pink conjunctiva, MMM, oropharynx clear Neck- Supple, good ROM CVS- RRR, no murmur RESP-CTAB Back- TTP lumbar spine and paraspinals, +SLR Right side, pain with ROM Neuro- normal tone bilat LE, normal sensation, decreased strength  RLE compared to Left EXT- No edema Pulses- Radial, DP- 2+        Assessment & Plan:

## 2013-03-12 ENCOUNTER — Other Ambulatory Visit (HOSPITAL_COMMUNITY)
Admission: RE | Admit: 2013-03-12 | Discharge: 2013-03-12 | Disposition: A | Discharge status: Home / Self Care | Payer: Medicaid Other | Source: Ambulatory Visit | Attending: Adult Health | Admitting: Adult Health

## 2013-03-12 ENCOUNTER — Ambulatory Visit (INDEPENDENT_AMBULATORY_CARE_PROVIDER_SITE_OTHER): Payer: Medicaid Other | Admitting: Adult Health

## 2013-03-12 ENCOUNTER — Encounter: Payer: Self-pay | Admitting: Adult Health

## 2013-03-12 ENCOUNTER — Other Ambulatory Visit: Payer: Medicaid Other | Admitting: Adult Health

## 2013-03-12 VITALS — BP 120/80 | HR 78 | Ht 65.5 in | Wt 258.0 lb

## 2013-03-12 DIAGNOSIS — R87810 Cervical high risk human papillomavirus (HPV) DNA test positive: Secondary | ICD-10-CM | POA: Insufficient documentation

## 2013-03-12 DIAGNOSIS — N938 Other specified abnormal uterine and vaginal bleeding: Secondary | ICD-10-CM

## 2013-03-12 DIAGNOSIS — Z0000 Encounter for general adult medical examination without abnormal findings: Secondary | ICD-10-CM

## 2013-03-12 DIAGNOSIS — Z124 Encounter for screening for malignant neoplasm of cervix: Secondary | ICD-10-CM | POA: Insufficient documentation

## 2013-03-12 DIAGNOSIS — Z01419 Encounter for gynecological examination (general) (routine) without abnormal findings: Secondary | ICD-10-CM

## 2013-03-12 DIAGNOSIS — R87619 Unspecified abnormal cytological findings in specimens from cervix uteri: Secondary | ICD-10-CM | POA: Insufficient documentation

## 2013-03-12 DIAGNOSIS — E669 Obesity, unspecified: Secondary | ICD-10-CM

## 2013-03-12 NOTE — Assessment & Plan Note (Signed)
Continues to gain weight, refer to dietician

## 2013-03-12 NOTE — Patient Instructions (Signed)
Physical in 1 year Mammogram at 42 Return in 1 week for [place]  Labs with PCP

## 2013-03-12 NOTE — Progress Notes (Signed)
Patient ID: [name], female   DOB: 1981-03-18, 32 y.o.   MRN: [id number] History of Present Illness: [name] is a 32 year old black female in for a pap and physical.Her LMP lasted 2.5 weeks.She would like to have 1 more child, her son is 10.   Current Medications, Allergies, Past Medical History, Past Surgical History, Family History and Social History were reviewed in [employer] record.   Past Medical History  Diagnosis Date  . Anxiety   . Obesity   . Asthma   . Autoimmune disease, not elsewhere classified(279.49) 2013    Non specific- [facility], abnormal Bone Marrow signal  . Hypertension   . Allergy     banana/pineapple  . Anemia     post pregnancy  . GERD (gastroesophageal reflux disease)   . Neuromuscular disorder     lower back and bilat leg pain  . Headache(784.0)   . Scoliosis   . Breast mass, right 08/18/2012  . Breast lump in female 11/28/2012    Has tender round mass at 10-11 0'clock right breast 4 finger breaths from areola will [place]   Past Surgical History  Procedure Laterality Date  . Tonsillectomy    . Dilation and curettage of uterus    Current outpatient prescriptions:albuterol (PROVENTIL HFA;VENTOLIN HFA) 108 (90 BASE) MCG/ACT inhaler, Inhale 2 puffs into the lungs every 4 (four) hours as needed., Disp: 6.7 g, Rfl: 2;  beclomethasone (QVAR) 40 MCG/ACT inhaler, Inhale 2 puffs into the lungs 2 (two) times daily., Disp: 1 Inhaler, Rfl: 3 ergocalciferol (VITAMIN D2) 50000 UNITS capsule, Take 1 capsule (50,000 Units total) by mouth once a week. One capsule once weekly, Disp: 12 capsule, Rfl: 1;  gabapentin (NEURONTIN) 100 MG capsule, One tablet daily at bedtime, Disp: 30 capsule, Rfl: 2;  hydrochlorothiazide (MICROZIDE) 12.5 MG capsule, Take 1 capsule (12.5 mg total) by mouth daily., Disp: 30 capsule, Rfl: 3 nabumetone (RELAFEN) 750 MG tablet, Take 750 mg by mouth 2 (two) times daily., Disp: , Rfl: ;  ondansetron (ZOFRAN) 4 MG  tablet, Take 1 tablet (4 mg total) by mouth daily as needed for nausea., Disp: 30 tablet, Rfl: 0;  potassium chloride (K-DUR) 10 MEQ tablet, Take 1 tablet (10 mEq total) by mouth daily., Disp: 30 tablet, Rfl: 5 venlafaxine XR (EFFEXOR XR) 37.5 MG 24 hr capsule, Take 1 capsule (37.5 mg total) by mouth daily., Disp: 30 capsule, Rfl: 2;  zolpidem (AMBIEN) 5 MG tablet, Take 1 tablet (5 mg total) by mouth at bedtime as needed for sleep., Disp: 20 tablet, Rfl: 3;  HYDROcodone-acetaminophen (NORCO) 5-325 MG per tablet, Take 1 tablet by mouth every 6 (six) hours as needed for moderate pain., Disp: 30 tablet, Rfl: 0  Review of Systems: Patient denies any headaches, blurred vision, shortness of breath, chest pain, abdominal pain, problems with bowel movements, urination, or intercourse. No joint pain but has back pain and sees Dr Jeanice Lim, no mood changes.See HPI.   Physical Exam:BP 120/80  Pulse 78  Ht 5' 5.5" (1.664 m)  Wt 258 lb (117.028 kg)  BMI 42.27 kg/m2  LMP 02/26/2013 General:  Well developed, well nourished, no acute distress Skin:  Warm and dry Neck:  Midline trachea, normal thyroid Lungs; Clear to auscultation bilaterally Breast:  No dominant palpable mass, retraction, or nipple discharge Cardiovascular: Regular rate and rhythm Abdomen:  Soft, non tender, no hepatosplenomegaly Pelvic:  External genitalia is normal in appearance.  The vagina is normal in appearance.  The cervix is bulbous.pap  with HPV performed.  Uterus is felt to be normal size, shape, and contour.  No  adnexal masses or tenderness noted. Extremities:  No swelling or varicosities noted Psych:  No mood changes, alert and cooperative, seems happy   Impression: Yearly gyn exam DUB Obesity    Plan: Return in 1 week for [place] and see me  Physical in 1 year Mammogram at 42  Labs with PCP

## 2013-03-12 NOTE — Assessment & Plan Note (Signed)
Cbc to be done, f/u GYN

## 2013-03-12 NOTE — Assessment & Plan Note (Addendum)
Uncontrolled back pain with radicular symptoms, despite multiple interventions, PT, nuerology, epidural injections I see nothing on the MRI that is directly causing her pain or weakness The abnormal signal has been looked at by oncology Will refer to neurosurgery for evaluation Discussed importance of weight loss Given short term script for norco

## 2013-03-16 ENCOUNTER — Encounter: Payer: Self-pay | Admitting: *Deleted

## 2013-03-16 ENCOUNTER — Telehealth (HOSPITAL_COMMUNITY): Payer: Self-pay | Admitting: Dietician

## 2013-03-16 NOTE — Telephone Encounter (Signed)
Received referral via fax from [facility] for dx: obesity. Noted that pt was referred previously at [facility] on 3/20014, however, pt did not respond to attempts to schedule appointment.

## 2013-03-16 NOTE — Telephone Encounter (Signed)
Called and left message on voicemail at [phone].

## 2013-03-18 ENCOUNTER — Telehealth: Payer: Self-pay | Admitting: Adult Health

## 2013-03-18 ENCOUNTER — Encounter: Payer: Self-pay | Admitting: Adult Health

## 2013-03-18 ENCOUNTER — Ambulatory Visit (INDEPENDENT_AMBULATORY_CARE_PROVIDER_SITE_OTHER): Payer: Medicaid Other | Admitting: Adult Health

## 2013-03-18 ENCOUNTER — Ambulatory Visit (INDEPENDENT_AMBULATORY_CARE_PROVIDER_SITE_OTHER): Payer: Medicaid Other

## 2013-03-18 VITALS — BP 120/84 | Ht 65.5 in | Wt 260.0 lb

## 2013-03-18 DIAGNOSIS — N949 Unspecified condition associated with female genital organs and menstrual cycle: Secondary | ICD-10-CM

## 2013-03-18 DIAGNOSIS — Z319 Encounter for procreative management, unspecified: Secondary | ICD-10-CM

## 2013-03-18 DIAGNOSIS — N938 Other specified abnormal uterine and vaginal bleeding: Secondary | ICD-10-CM

## 2013-03-18 NOTE — Patient Instructions (Signed)
Call with next period Take OTC prenatal with folic acid Infertility WHAT IS INFERTILITY?  Infertility is usually defined as not being able to get pregnant after trying for one year of regular sexual intercourse without the use of contraceptives. Or not being able to carry a pregnancy to term and have a baby. The infertility rate in the [place] is around 10%. Pregnancy is the result of a chain of events. A woman must release an egg from one of her ovaries (ovulation). The egg must be fertilized by the female sperm. Then it travels through a fallopian tube into the uterus (womb), where it attaches to the wall of the uterus and grows. A man must have enough sperm, and the sperm must join with (fertilize) the egg along the way, at the proper time. The fertilized egg must then become attached to the inside of the uterus. While this may seem simple, many things can happen to prevent pregnancy from occurring.  WHOSE PROBLEM IS IT?  About 20% of infertility cases are due to problems with the man (female factors) and 65% are due to problems with the woman (female factors). Other cases are due to a combination of female and female factors or to unknown causes.  WHAT CAUSES INFERTILITY IN MEN?  Infertility in men is often caused by problems with making enough normal sperm or getting the sperm to reach the egg. Problems with sperm may exist from birth or develop later in life, due to illness or injury. Some men produce no sperm, or produce too few sperm (oligospermia). Other problems include:  Sexual dysfunction.  Hormonal or endocrine problems.  Age. Female fertility decreases with age, but not at as young an age as female fertility.  Infection.  Congenital problems. Birth defect, such as absence of the tubes that carry the sperm (vas deferens).  Genetic/chromosomal problems.  Antisperm antibody problems.  Retrograde ejaculation (sperm go into the bladder).  Varicoceles, spematoceles, or tumors of the  testicles.  Lifestyle can influence the number and quality of a man's sperm.  Alcohol and drugs can temporarily reduce sperm quality.  Environmental toxins, including pesticides and lead, may cause some cases of infertility in men. WHAT CAUSES INFERTILITY IN WOMEN?   Problems with ovulation account for most infertility in women. Without ovulation, eggs are not available to be fertilized.  Signs of problems with ovulation include irregular menstrual periods or no periods at all.  Simple lifestyle factors, including stress, diet, or athletic training, can affect a woman's hormonal balance.  Age. Fertility begins to decrease in women in the early 38s and is worse after age 68.  Much less often, a hormonal imbalance from a serious medical problem, such as a pituitary gland tumor, thyroid or other chronic medical disease, can cause ovulation problems.  Pelvic infections.  Polycystic ovary syndrome (increase in female hormones, unable to ovulate).  Alcohol or illegal drugs.  Environmental toxins, radiation, pesticides, and certain chemicals.  Aging is an important factor in female infertility.  The ability of a woman's ovaries to produce eggs declines with age, especially after age 20. About one third of couples where the woman is over 35 will have problems with fertility.  By the time she reaches menopause when her monthly periods stop for good, a woman can no longer produce eggs or become pregnant.  Other problems can also lead to infertility in women. If the fallopian tubes are blocked at one or both ends, the egg cannot travel through the tubes into the  uterus. Scar tissue (adhesions) in the pelvis may cause blocked tubes. This may result from pelvic inflammatory disease, endometriosis, or surgery for an ectopic pregnancy (fertilized egg implanted outside the uterus) or any pelvic or abdominal surgery causing adhesions.  Fibroid tumors or polyps of the uterus.  Congenital (birth  defect) abnormalities of the uterus.  Infection of the cervix (cervicitis).  Cervical stenosis (narrowing).  Abnormal cervical mucus.  Polycystic ovary syndrome.  Having sexual intercourse too often (every other day or 4 to 5 times a week).  Obesity.  Anorexia.  Poor nutrition.  Over exercising, with loss of body fat.  DES. Your mother received diethylstilbesterol hormone when pregnant with you. HOW IS INFERTILITY TESTED?  If you have been trying to have a baby without success, you may want to seek medical help. You should not wait for one year of trying before seeing a health care provider if:  You are over 35.  You have reason to believe that there may be a fertility problem. A medical evaluation may determine the reasons for a couple's infertility. Usually this process begins with:  Physical exams.  Medical histories of both partners.  Sexual histories of both partners. If there is no obvious problem, like improperly timed intercourse or absence of ovulation, tests may be needed.   For a man, testing usually begins with tests of his semen to look at:  The number of sperm.  The shape of sperm.  Movement of his sperm.  Taking a complete medical and surgical history.  Physical examination.  Check for infection of the female reproductive organs. Sometimes hormone tests are done.   For a woman, the first step in testing is to find out if she is ovulating each month. There are several ways to do this. For example, she can keep track of changes in her morning body temperature and in the texture of her cervical mucus. Another tool is a home ovulation test kit, which can be bought at drug or grocery stores.  Checks of ovulation can also be done in the doctor's office, using blood tests for hormone levels or ultrasound tests of the ovaries. If the woman is ovulating, more tests will need to be done. Some common female tests include:  Hysterosalpingogram: An x-ray of the  fallopian tubes and uterus after they are injected with dye. It shows if the tubes are open and shows the shape of the uterus.  Laparoscopy: An exam of the tubes and other female organs for disease. A lighted tube called a laparoscope is used to see inside the abdomen.  Endometrial biopsy: Sample of uterus tissue taken on the first day of the menstrual period, to see if the tissue indicates you are ovulating.  Transvaginal ultrasound: Examines the female organs.  Hysteroscopy: Uses a lighted tube to examine the cervix and inside the uterus, to see if there are any abnormalities inside the uterus. TREATMENT  Depending on the test results, different treatments can be suggested. The type of treatment depends on the cause. 85 to 90% of infertility cases are treated with drugs or surgery.   Various fertility drugs may be used for women with ovulation problems. It is important to talk with your caregiver about the drug to be used. You should understand the drug's benefits and side effects. Depending on the type of fertility drug and the dosage of the drug used, multiple births (twins or multiples) can occur in some women.  If needed, surgery can be done to repair damage to  a woman's ovaries, fallopian tubes, cervix, or uterus.  Surgery or medical treatment for endometriosis or polycystic ovary syndrome. Sometimes a man has an infertility problem that can be corrected with medicine or by surgery.  Intrauterine insemination (IUI) of sperm, timed with ovulation.  Change in lifestyle, if that is the cause (lose weight, increase exercise, and stop smoking, drinking excessively, or taking illegal drugs).  Other types of surgery:  Removing growths inside and on the uterus.  Removing scar tissue from inside of the uterus.  Fixing blocked tubes.  Removing scar tissue in the pelvis and around the female organs. WHAT IS ASSISTED REPRODUCTIVE TECHNOLOGY (ART)?  Assisted reproductive technology (ART) is  another form of special methods used to help infertile couples. ART involves handling both the woman's eggs and the man's sperm. Success rates vary and depend on many factors. ART can be expensive and time-consuming. But ART has made it possible for many couples to have children that otherwise would not have been conceived. Some methods are listed below:  In vitro fertilization (IVF). IVF is often used when a woman's fallopian tubes are blocked or when a man has low sperm counts. A drug is used to stimulate the ovaries to produce multiple eggs. Once mature, the eggs are removed and placed in a culture dish with the man's sperm for fertilization. After about 40 hours, the eggs are examined to see if they have become fertilized by the sperm and are dividing into cells. These fertilized eggs (embryos) are then placed in the woman's uterus. This bypasses the fallopian tubes.  Gamete intrafallopian transfer (GIFT) is similar to IVF, but used when the woman has at least one normal fallopian tube. Three to five eggs are placed in the fallopian tube, along with the man's sperm, for fertilization inside the woman's body.  Zygote intrafallopian transfer (ZIFT), also called tubal embryo transfer, combines IVF and GIFT. The eggs retrieved from the woman's ovaries are fertilized in the lab and placed in the fallopian tubes rather than in the uterus.  ART procedures sometimes involve the use of donor eggs (eggs from another woman) or previously frozen embryos. Donor eggs may be used if a woman has impaired ovaries or has a genetic disease that could be passed on to her baby.  When performing ART, you are at higher risk for resulting in multiple pregnancies, twins, triplets or more.  Intracytoplasma sperm injection is a procedure that injects a single sperm into the egg to fertilize it.  Embryo transplant is a procedure that starts after growing an embryo in a special media (chemical solution) developed to keep the  embryo alive for 2 to 5 days, and then transplanting it into the uterus. In cases where a cause cannot be found and pregnancy does not occur, adoption may be a consideration. Document Released: 04/19/2003 Document Revised: 07/09/2011 Document Reviewed: 03/15/2009 [facility] Patient Information 2014 [place], [place].

## 2013-03-18 NOTE — Telephone Encounter (Signed)
Left message to call.

## 2013-03-18 NOTE — Telephone Encounter (Signed)
Has appt for December 2 for colpo she had questions that I answered regarding the HPV+ test

## 2013-03-18 NOTE — Progress Notes (Signed)
Subjective:     Patient ID: [name], female   DOB: 1980-06-21, 32 y.o.   MRN: [id number]  HPI [name] is a 32 year old black female in for [place] for recent DUB.She desires a pregnancy.Had some spotting after sex Saturday, none now.  Review of Systems See HPI Reviewed past medical,surgical, social and family history. Reviewed medications and allergies.     Objective:   Physical Exam BP 120/84  Ht 5' 5.5" (1.664 m)  Wt 260 lb (117.935 kg)  BMI 42.59 kg/m2  LMP 02/26/2013   Reviewed [place] with pt. Uterus is 8.1x5 x 4.1 cm, ovaries are normal and endometrium is 5.0 mm. Discussed timing of sex and ovulation, call with next menses.  Assessment:     DUB Desires pregnancy    Plan:     Review handout on infertility and start OTC prenatal with folic acid Follow up prn

## 2013-03-23 NOTE — Telephone Encounter (Signed)
No response from previous contact attempt. Sent letter to pt home via US Mail in attempt to contact pt to schedule appointment.  

## 2013-03-24 ENCOUNTER — Other Ambulatory Visit: Payer: Self-pay | Admitting: Adult Health

## 2013-03-30 ENCOUNTER — Other Ambulatory Visit: Payer: Self-pay | Admitting: Adult Health

## 2013-03-30 ENCOUNTER — Telehealth: Payer: Self-pay | Admitting: Adult Health

## 2013-03-30 MED ORDER — MEGESTROL ACETATE 40 MG PO TABS: ORAL_TABLET | ORAL | Status: DC

## 2013-03-30 NOTE — Telephone Encounter (Signed)
Pt bleeding prolonged wants it stopped, will try megace use condoms

## 2013-03-31 ENCOUNTER — Ambulatory Visit (INDEPENDENT_AMBULATORY_CARE_PROVIDER_SITE_OTHER): Payer: Medicaid Other | Admitting: Obstetrics & Gynecology

## 2013-03-31 ENCOUNTER — Encounter: Payer: Self-pay | Admitting: Obstetrics & Gynecology

## 2013-03-31 VITALS — BP 140/80 | Ht 65.2 in | Wt 255.0 lb

## 2013-03-31 DIAGNOSIS — N879 Dysplasia of cervix uteri, unspecified: Secondary | ICD-10-CM

## 2013-03-31 DIAGNOSIS — R87610 Atypical squamous cells of undetermined significance on cytologic smear of cervix (ASC-US): Secondary | ICD-10-CM

## 2013-03-31 DIAGNOSIS — R87810 Cervical high risk human papillomavirus (HPV) DNA test positive: Secondary | ICD-10-CM

## 2013-03-31 NOTE — Progress Notes (Signed)
Patient ID: [name], female   DOB: 1980-11-16, 32 y.o.   MRN: [id number] Pap:  ASCUS + HPV HR  Colposcopy: Dense acetowhite changes at 9 o'clock of the cervix Biopsy taken No punctation or mosaicism  Follow up 1 weeks  Impression is HPV atypia

## 2013-04-01 ENCOUNTER — Other Ambulatory Visit: Payer: Self-pay | Admitting: Obstetrics & Gynecology

## 2013-04-02 ENCOUNTER — Encounter: Payer: Self-pay | Admitting: *Deleted

## 2013-04-07 ENCOUNTER — Encounter: Payer: Self-pay | Admitting: Obstetrics & Gynecology

## 2013-04-07 ENCOUNTER — Ambulatory Visit (INDEPENDENT_AMBULATORY_CARE_PROVIDER_SITE_OTHER): Payer: Medicaid Other | Admitting: Obstetrics & Gynecology

## 2013-04-07 VITALS — BP 130/90 | Wt 259.0 lb

## 2013-04-07 DIAGNOSIS — R87810 Cervical high risk human papillomavirus (HPV) DNA test positive: Secondary | ICD-10-CM

## 2013-04-07 DIAGNOSIS — N879 Dysplasia of cervix uteri, unspecified: Secondary | ICD-10-CM

## 2013-04-07 NOTE — Progress Notes (Signed)
Patient ID: [name], female   DOB: 07/15/1980, 32 y.o.   MRN: [id number] Pap: ASCUS + HPV HR  Colposcopy:  Dense acetowhite changes at 9 o'clock of the cervix  Biopsy taken  No punctation or mosaicism  Follow up 1 weeks  Impression is HPV atypia      Pathology is consistent with the colposcopy, Koilocytic atypia no dysplasia  folllow up  1 year here

## 2013-04-10 ENCOUNTER — Other Ambulatory Visit: Payer: Medicaid Other

## 2013-05-12 ENCOUNTER — Ambulatory Visit (INDEPENDENT_AMBULATORY_CARE_PROVIDER_SITE_OTHER): Payer: Medicaid Other | Admitting: Family Medicine

## 2013-05-12 ENCOUNTER — Encounter: Payer: Self-pay | Admitting: Family Medicine

## 2013-05-12 VITALS — BP 120/98 | HR 80 | Temp 99.2°F | Resp 20 | Ht 65.5 in | Wt 260.0 lb

## 2013-05-12 DIAGNOSIS — J209 Acute bronchitis, unspecified: Secondary | ICD-10-CM

## 2013-05-12 DIAGNOSIS — J45909 Unspecified asthma, uncomplicated: Secondary | ICD-10-CM

## 2013-05-12 MED ORDER — AZITHROMYCIN 250 MG PO TABS: ORAL_TABLET | ORAL | Status: DC

## 2013-05-12 MED ORDER — PREDNISONE 10 MG PO TABS: ORAL_TABLET | ORAL | Status: DC

## 2013-05-12 NOTE — Progress Notes (Signed)
   Subjective:    Patient ID: [name], female    DOB: Jul 30, 1980, 33 y.o.   MRN: [id number]  HPI  Patient here with cough with mild production sore throat head congestion for the past week. She's been using her albuterol inhaler more than usual for her asthma as well. She's been wheezing more at nighttime. She's not had any fever. She's noticed some sinus drainage down her throat causing a sore throat. +sick contacts with son who had flu like illness last week  Review of Systems  GEN- + fatigue, fever, weight loss,weakness, recent illness HEENT- denies eye drainage, change in vision, +nasal discharge, CVS- denies chest pain, palpitations RESP- denies SOB, +cough,+ wheeze Neuro- denies headache, dizziness, syncope, seizure activity      Objective:   Physical Exam  GEN- NAD, alert and oriented x3, low grade fever HEENT- PERRL, EOMI, non injected sclera, pink conjunctiva, MMM, oropharynx mild injection, TM clear bilat no effusion, Mild maxillary sinus tenderness, + clear  Nasal drainage  Neck- Supple, shotty LAD CVS- RRR, no murmur RESP- course congestion, no wheeze, good air movement, normal WOB, coughing fits during exam EXT- No edema Pulses- Radial 2+         Assessment & Plan:

## 2013-05-12 NOTE — Patient Instructions (Signed)
Take antibiotics as prescribed Take the prednisone as prescribed Use the inhaler  Mucinex DM for cough Humidfier  F/U Feb

## 2013-05-12 NOTE — Assessment & Plan Note (Signed)
Concern for acute bronchitis on top of her asthma. I will go ahead and put her on azithromycin as I am worried she will decompensate. She will also be placed on prednisone burst and continue her albuterol inhaler around the clock. Mucinex DM and humidifier

## 2013-05-12 NOTE — Assessment & Plan Note (Signed)
Per above acute illness which is triggering her asthma

## 2013-05-14 ENCOUNTER — Telehealth: Payer: Self-pay | Admitting: Family Medicine

## 2013-05-14 NOTE — Telephone Encounter (Signed)
Pt is calling because her cough isn't getting any better when she coughs it makes everything hurt  [place] Call back number is [phone]

## 2013-05-19 ENCOUNTER — Ambulatory Visit: Payer: Medicaid Other | Admitting: Family Medicine

## 2013-05-27 ENCOUNTER — Ambulatory Visit (INDEPENDENT_AMBULATORY_CARE_PROVIDER_SITE_OTHER): Payer: Medicaid Other | Admitting: Family Medicine

## 2013-05-27 VITALS — BP 120/90 | HR 92 | Temp 98.2°F | Resp 18 | Ht 65.5 in | Wt 262.0 lb

## 2013-05-27 DIAGNOSIS — J209 Acute bronchitis, unspecified: Secondary | ICD-10-CM

## 2013-05-27 DIAGNOSIS — J45909 Unspecified asthma, uncomplicated: Secondary | ICD-10-CM

## 2013-05-27 DIAGNOSIS — J310 Chronic rhinitis: Secondary | ICD-10-CM

## 2013-05-27 MED ORDER — METHYLPREDNISOLONE ACETATE 40 MG/ML IJ SUSP
40.0000 mg | Freq: Once | INTRAMUSCULAR | Status: AC
  Administered 2013-05-27: 40 mg via INTRAMUSCULAR

## 2013-05-27 MED ORDER — FLUCONAZOLE 150 MG PO TABS: 150.0000 mg | ORAL_TABLET | Freq: Once | ORAL | Status: DC

## 2013-05-27 MED ORDER — GUAIFENESIN-CODEINE 100-10 MG/5ML PO SOLN
10.0000 mL | Freq: Four times a day (QID) | ORAL | Status: DC | PRN
Start: 2013-05-27 — End: 2013-07-28

## 2013-05-27 MED ORDER — PREDNISONE 10 MG PO TABS: ORAL_TABLET | ORAL | Status: DC

## 2013-05-27 MED ORDER — FLUTICASONE PROPIONATE 50 MCG/ACT NA SUSP: 2.0000 | Freq: Every day | NASAL | Status: DC

## 2013-05-27 NOTE — Progress Notes (Signed)
   Subjective:    Patient ID: [name], female    DOB: 07-08-1980, 33 y.o.   MRN: [id number]  HPI   Pt seen 2 weeks ago with acute bronchitis in setting of her asthma. She completed the prednisone which was a small burst and she improved some however the past week she's had worsening cough and wheezing. She's been using her nebulizer 2-3 times a day at times. She's difficulty breathing at night secondary to severe coughing. Her cough has mild production. She's also getting significant nasal drainage down the back of her throat, but denies sinus pressure or fever.  Review of Systems  GEN- denies fatigue, fever, weight loss,weakness, recent illness HEENT- denies eye drainage, change in vision, nasal discharge, CVS- denies chest pain, palpitations RESP- denies SOB, cough, wheeze Neuro- denies headache, dizziness, syncope, seizure activity      Objective:   Physical Exam  GEN- NAD, alert and oriented x3 HEENT- PERRL, EOMI, non injected sclera, pink conjunctiva, MMM, oropharynx clear, TM clear bilat no effusion,   inflammed turbinates,  Nasal drainage  Neck- Supple, no LAD CVS- RRR, no murmur RESP-CTAB, no wheeze, harsh cough EXT- No edema Pulses- Radial 2+         Assessment & Plan:

## 2013-05-27 NOTE — Patient Instructions (Signed)
Take the cough medication with codiene Restart prednisone taper Use Flonase F/U as previous

## 2013-05-28 ENCOUNTER — Encounter: Payer: Self-pay | Admitting: Family Medicine

## 2013-05-28 DIAGNOSIS — J310 Chronic rhinitis: Secondary | ICD-10-CM | POA: Insufficient documentation

## 2013-05-28 NOTE — Assessment & Plan Note (Signed)
Do to her current respiratory illness. No signs of sinusitis and she started completed a course of azithromycin. I will give her Flonase to use for the rhinitis and postnasal drip

## 2013-05-28 NOTE — Assessment & Plan Note (Signed)
To have given her injection of Depo-Medrol she's not having an aspirin exacerbation at this time. I will give her a longer taper on the steroids as well as cough medicine with codeine. She can continue her albuterol as needed. Her oxygen saturations were normal. We will hold on chest x-ray, she does not improve.

## 2013-06-03 ENCOUNTER — Encounter: Payer: Self-pay | Admitting: *Deleted

## 2013-06-10 ENCOUNTER — Ambulatory Visit: Payer: Medicaid Other | Admitting: Family Medicine

## 2013-07-16 ENCOUNTER — Other Ambulatory Visit (HOSPITAL_COMMUNITY): Payer: Self-pay

## 2013-07-16 DIAGNOSIS — D890 Polyclonal hypergammaglobulinemia: Secondary | ICD-10-CM

## 2013-07-20 ENCOUNTER — Encounter (HOSPITAL_COMMUNITY): Payer: Medicaid Other | Attending: Hematology and Oncology

## 2013-07-20 DIAGNOSIS — J45909 Unspecified asthma, uncomplicated: Secondary | ICD-10-CM | POA: Insufficient documentation

## 2013-07-20 DIAGNOSIS — E8809 Other disorders of plasma-protein metabolism, not elsewhere classified: Secondary | ICD-10-CM | POA: Insufficient documentation

## 2013-07-20 DIAGNOSIS — D890 Polyclonal hypergammaglobulinemia: Secondary | ICD-10-CM

## 2013-07-20 DIAGNOSIS — K219 Gastro-esophageal reflux disease without esophagitis: Secondary | ICD-10-CM | POA: Insufficient documentation

## 2013-07-20 DIAGNOSIS — N63 Unspecified lump in unspecified breast: Secondary | ICD-10-CM | POA: Insufficient documentation

## 2013-07-20 DIAGNOSIS — Z09 Encounter for follow-up examination after completed treatment for conditions other than malignant neoplasm: Secondary | ICD-10-CM | POA: Insufficient documentation

## 2013-07-20 DIAGNOSIS — I10 Essential (primary) hypertension: Secondary | ICD-10-CM | POA: Insufficient documentation

## 2013-07-20 DIAGNOSIS — E669 Obesity, unspecified: Secondary | ICD-10-CM | POA: Insufficient documentation

## 2013-07-20 DIAGNOSIS — F411 Generalized anxiety disorder: Secondary | ICD-10-CM | POA: Insufficient documentation

## 2013-07-20 LAB — CBC WITH DIFFERENTIAL/PLATELET
Basophils Absolute: 0.1 10*3/uL (ref 0.0–0.1)
Basophils Relative: 1 % (ref 0–1)
EOS PCT: 5 % (ref 0–5)
Eosinophils Absolute: 0.4 10*3/uL (ref 0.0–0.7)
HEMATOCRIT: 40.7 % (ref 36.0–46.0)
HEMOGLOBIN: 13.8 g/dL (ref 12.0–15.0)
LYMPHS ABS: 2.3 10*3/uL (ref 0.7–4.0)
LYMPHS PCT: 29 % (ref 12–46)
MCH: 31.1 pg (ref 26.0–34.0)
MCHC: 33.9 g/dL (ref 30.0–36.0)
MCV: 91.7 fL (ref 78.0–100.0)
MONO ABS: 0.4 10*3/uL (ref 0.1–1.0)
MONOS PCT: 5 % (ref 3–12)
NEUTROS ABS: 4.7 10*3/uL (ref 1.7–7.7)
Neutrophils Relative %: 60 % (ref 43–77)
Platelets: 336 10*3/uL (ref 150–400)
RBC: 4.44 MIL/uL (ref 3.87–5.11)
RDW: 12.6 % (ref 11.5–15.5)
WBC: 7.9 10*3/uL (ref 4.0–10.5)

## 2013-07-20 LAB — COMPREHENSIVE METABOLIC PANEL
ALT: 8 U/L (ref 0–35)
AST: 13 U/L (ref 0–37)
Albumin: 3.6 g/dL (ref 3.5–5.2)
Alkaline Phosphatase: 86 U/L (ref 39–117)
BILIRUBIN TOTAL: 0.4 mg/dL (ref 0.3–1.2)
BUN: 8 mg/dL (ref 6–23)
CHLORIDE: 105 meq/L (ref 96–112)
CO2: 28 meq/L (ref 19–32)
CREATININE: 0.86 mg/dL (ref 0.50–1.10)
Calcium: 9.4 mg/dL (ref 8.4–10.5)
GFR, EST NON AFRICAN AMERICAN: 90 mL/min — AB (ref >90)
GLUCOSE: 93 mg/dL (ref 70–99)
Potassium: 4.3 mEq/L (ref 3.7–5.3)
Sodium: 143 mEq/L (ref 137–147)
Total Protein: 7.3 g/dL (ref 6.0–8.3)

## 2013-07-20 NOTE — Progress Notes (Signed)
Labs drawn today for cbc/diff,cmp,protein electrophoresis,immunofixation

## 2013-07-22 LAB — PROTEIN ELECTROPHORESIS, SERUM
ALBUMIN ELP: 53.1 % — AB (ref 55.8–66.1)
Alpha-1-Globulin: 4.7 % (ref 2.9–4.9)
Alpha-2-Globulin: 9.6 % (ref 7.1–11.8)
BETA 2: 6.8 % — AB (ref 3.2–6.5)
Beta Globulin: 6.8 % (ref 4.7–7.2)
Gamma Globulin: 19 % — ABNORMAL HIGH (ref 11.1–18.8)
M-Spike, %: NOT DETECTED g/dL
TOTAL PROTEIN ELP: 6.8 g/dL (ref 6.0–8.3)

## 2013-07-22 LAB — IMMUNOFIXATION ELECTROPHORESIS
IGA: 278 mg/dL (ref 69–380)
IgG (Immunoglobin G), Serum: 1290 mg/dL (ref 690–1700)
IgM, Serum: 153 mg/dL (ref 52–322)
TOTAL PROTEIN ELP: 6.8 g/dL (ref 6.0–8.3)

## 2013-07-26 ENCOUNTER — Encounter (HOSPITAL_COMMUNITY): Payer: Self-pay | Admitting: Oncology

## 2013-07-26 DIAGNOSIS — D890 Polyclonal hypergammaglobulinemia: Secondary | ICD-10-CM

## 2013-07-26 HISTORY — DX: Polyclonal hypergammaglobulinemia: D89.0

## 2013-07-26 NOTE — Progress Notes (Signed)
Mary Blackbird, MD [address] [place] [address]  Polyclonal gammopathy  CURRENT THERAPY: Observation  INTERVAL HISTORY: [name] 33 y.o. female returns for  regular  visit for followup of polyclonal gammopathy, reactive and benign.  I personally reviewed and went over laboratory results with the patient.  The results are noted within this dictation.  Labs on 07/20/13 demonstrate normal IgG, IgA, and IgM levels without an M-spike.  Additionally, she has no CRAB features.  There was no light chain assay ordered by [name] last year and therefore, I cannot comment on any light chain abnormalities today.   I personally reviewed and went over radiographic studies with the patient.  The results are noted within this dictation.  Mammogram performed on and 12/10/2013 with an [place] was BIRADS 1 when clinical abnormality of breast was identified on physical exam.     Her only complaint is fatigue and I do not see any focal issues on exam.  She does have heavy menstrual cycles.  Iron studies were not checked with her labs since we were seeing her for a polyclonal gammopathy.  I have offered to check her iron tests today, but she would rather do that with her PCP, [name], which is likely more appropriate at this time.   I provided the patient education regarding polyclonal gammopathy.  Her labs most recently were WNL.  We will release the patient from the clinic at this time to follow-up with her primary care physician.  Hematologically, she denies any complaints and ROS questioning is negative.  Past Medical History  Diagnosis Date  . Anxiety   . Obesity   . Asthma   . Autoimmune disease, not elsewhere classified(279.49) 2013    Non specific- [facility], abnormal Bone Marrow signal  . Hypertension   . Allergy     banana/pineapple  . Anemia     post pregnancy  . GERD (gastroesophageal reflux disease)   . Neuromuscular disorder     lower back and  bilat leg pain  . Headache(784.0)   . Scoliosis   . Breast mass, right 08/18/2012  . Breast lump in female 11/28/2012    Has tender round mass at 10-11 0'clock right breast 4 finger breaths from areola will [place]  . Polyclonal gammopathy 07/26/2013    Insignificant    has OBESITY; Generalized anxiety disorder; GALACTORRHEA; BACK PAIN, CHRONIC; FATIGUE; HEADACHE; Hypertension; Tenosynovitis; Allergic rhinitis; Asthma in adult; Breast discharge; Dermatitis; Insomnia; Breast pain; Heartburn; Breast mass, right; Breast lump in female; Back pain with radiation; Nausea alone; DUB (dysfunctional uterine bleeding); Dysplasia of cervix, unspecified; Acute bronchitis; Rhinitis; and Polyclonal gammopathy on her problem list.     is allergic to banana and pineapple.  [name] had no medications administered during this visit.  Past Surgical History  Procedure Laterality Date  . Tonsillectomy    . Dilation and curettage of uterus      Denies any headaches, dizziness, double vision, fevers, chills, night sweats, nausea, vomiting, diarrhea, constipation, chest pain, heart palpitations, shortness of breath, blood in stool, black tarry stool, urinary pain, urinary burning, urinary frequency, hematuria.   PHYSICAL EXAMINATION  ECOG PERFORMANCE STATUS: 0 - Asymptomatic  Filed Vitals:   07/28/13 1343  BP: 132/48  Pulse: 105  Temp: 98 F (36.7 C)  Resp: 20    GENERAL:alert, healthy, no distress, well nourished, well developed, comfortable, cooperative, obese and smiling SKIN: skin color, texture, turgor are normal, no rashes or significant  lesions HEAD: Normocephalic, No masses, lesions, tenderness or abnormalities EYES: normal, PERRLA, EOMI, Conjunctiva are pink and non-injected EARS: External ears normal OROPHARYNX:mucous membranes are moist  NECK: supple, no adenopathy, thyroid normal size, non-tender, without nodularity, no stridor, non-tender, trachea midline LYMPH:  no palpable  lymphadenopathy BREAST:not examined LUNGS: clear to auscultation and percussion HEART: regular rate & rhythm, no murmurs, no gallops, S1 normal and S2 normal ABDOMEN:abdomen soft, non-tender, obese, normal bowel sounds and no masses or organomegaly BACK: Back symmetric, no curvature., No CVA tenderness EXTREMITIES:less then 2 second capillary refill, no joint deformities, effusion, or inflammation, no edema, no skin discoloration, no clubbing, no cyanosis  NEURO: alert & oriented x 3 with fluent speech, no focal motor/sensory deficits, gait normal   LABORATORY DATA: CBC    Component Value Date/Time   WBC 7.9 07/20/2013 0955   RBC 4.44 07/20/2013 0955   HGB 13.8 07/20/2013 0955   HCT 40.7 07/20/2013 0955   PLT 336 07/20/2013 0955   MCV 91.7 07/20/2013 0955   MCH 31.1 07/20/2013 0955   MCHC 33.9 07/20/2013 0955   RDW 12.6 07/20/2013 0955   LYMPHSABS 2.3 07/20/2013 0955   MONOABS 0.4 07/20/2013 0955   EOSABS 0.4 07/20/2013 0955   BASOSABS 0.1 07/20/2013 0955      Chemistry      Component Value Date/Time   NA 143 07/20/2013 0955   K 4.3 07/20/2013 0955   CL 105 07/20/2013 0955   CO2 28 07/20/2013 0955   BUN 8 07/20/2013 0955   CREATININE 0.86 07/20/2013 0955   CREATININE 0.91 02/11/2013 1114      Component Value Date/Time   CALCIUM 9.4 07/20/2013 0955   ALKPHOS 86 07/20/2013 0955   AST 13 07/20/2013 0955   ALT 8 07/20/2013 0955   BILITOT 0.4 07/20/2013 0955     Results for [name], [name] (MRN [id number]) as of 07/26/2013 20:47  Ref. Range 07/20/2013 09:55 07/20/2013 09:55  Total Protein ELP Latest Range: 6.0-8.3 g/dL 6.8 6.8  Albumin ELP Latest Range: 55.8-66.1 %  53.1 (L)  Alpha-1-Globulin Latest Range: 2.9-4.9 %  4.7  Alpha-2-Globulin Latest Range: 7.1-11.8 %  9.6  Beta Globulin Latest Range: 4.7-7.2 %  6.8  Beta 2 Latest Range: 3.2-6.5 %  6.8 (H)  Gamma Globulin Latest Range: 11.1-18.8 %  19.0 (H)  M-SPIKE, % No range found  NOT DETECTED  SPE Interp. No range found  (NOTE)    Comment No range found  (NOTE)  IgG (Immunoglobin G), Serum Latest Range: [id number] mg/dL 1290   IgA Latest Range: 69-380 mg/dL 278   IgM, Serum Latest Range: 52-322 mg/dL 153      RADIOGRAPHIC STUDIES:  12/10/2012  *RADIOLOGY REPORT*  Clinical Data: A lump in the upper outer right breast.  DIGITAL DIAGNOSTIC BILATERAL MAMMOGRAM WITH CAD AND RIGHT BREAST  ULTRASOUND:  Comparison: Ultrasound from 05/11/2011  Findings:  ACR Breast Density Category b: There are scattered areas of  fibroglandular density.  Standard CC and MLO views of both breasts were obtained in addition  to a spot compression view of the right breast. No suspicious  calcifications, masses, or architectural distortion is seen in  either breast. Specifically, no abnormality is seen underlying the  palpable marker in the upper outer right breast.  Mammographic images were processed with CAD.  On physical exam, no palpable abnormality is identified.  Ultrasound is performed, showing no abnormality in the area of  palpable concern.  IMPRESSION:  BI-RADS CATEGORY 1: Negative.  RECOMMENDATION:  Further  management the patient's palpable abnormality should be on  a clinical basis. Screening mammogram at age 4 unless there are  persistent or intervening clinical concerns. (Code:SM-B-40A)  I have discussed the findings and recommendations with the patient.  Results were also provided in writing at the conclusion of the  visit. If applicable, a reminder letter will be sent to the  patient regarding the next appointment.  Original Report Authenticated By: Donavan Burnet, M.D.    ASSESSMENT:  1. Polyclonal gammopathy, benign and reactive in past.  Resolved on labs performed on 07/20/2013 2. Fatigue, etiology unknown.  Patient Active Problem List   Diagnosis Date Noted  . Polyclonal gammopathy 07/26/2013  . Rhinitis 05/28/2013  . Acute bronchitis 05/12/2013  . Dysplasia of cervix, unspecified 03/31/2013  . DUB  (dysfunctional uterine bleeding) 03/10/2013  . Back pain with radiation 02/05/2013  . Nausea alone 02/05/2013  . Breast lump in female 11/28/2012  . Breast mass, right 08/18/2012  . Breast pain 04/18/2012  . Heartburn 04/18/2012  . Dermatitis 12/09/2011  . Insomnia 12/09/2011  . Breast discharge 05/09/2011  . Asthma in adult 04/18/2011  . Hypertension 09/21/2010  . Tenosynovitis 09/21/2010  . Allergic rhinitis 09/21/2010  . FATIGUE 12/13/2009  . GALACTORRHEA 01/18/2009  . HEADACHE 01/18/2009  . OBESITY 11/03/2007  . Generalized anxiety disorder 11/03/2007  . BACK PAIN, CHRONIC 11/03/2007    PLAN:  1. I personally reviewed and went over laboratory results with the patient.  The results are noted within this dictation. 2. I personally reviewed and went over radiographic studies with the patient.  The results are noted within this dictation.   3. Chart reviewed 4. Problem list reviewed and updated.  5. Recommend follow-up with [name] as directed 6. Release from the clinic with follow-up with primary care physician and Gynecology as directed.   THERAPY PLAN:  Labs performed in March 2015 demonstrate a SPEP + IFE that is within normal limites without IgG, IgA, and IgM elevation.  All questions were answered. The patient knows to call the clinic with any problems, questions or concerns. We can certainly see the patient much sooner if necessary.  Patient and plan discussed with Dr. Farrel Gobble and he is in agreement with the aforementioned.   [name] 07/28/2013

## 2013-07-28 ENCOUNTER — Encounter (HOSPITAL_BASED_OUTPATIENT_CLINIC_OR_DEPARTMENT_OTHER): Payer: Medicaid Other | Admitting: Oncology

## 2013-07-28 VITALS — BP 132/48 | HR 105 | Temp 98.0°F | Resp 20 | Wt 256.0 lb

## 2013-07-28 DIAGNOSIS — D890 Polyclonal hypergammaglobulinemia: Secondary | ICD-10-CM

## 2013-07-28 NOTE — Patient Instructions (Signed)
[facility] Discharge Instructions  RECOMMENDATIONS MADE BY THE CONSULTANT AND ANY TEST RESULTS WILL BE SENT TO YOUR REFERRING PHYSICIAN.  EXAM FINDINGS BY THE PHYSICIAN TODAY AND SIGNS OR SYMPTOMS TO REPORT TO CLINIC OR PRIMARY PHYSICIAN:   You are being discharged from the clinic. Please follow up with Dr. Buelah Manis for your needs. If you ever need to be referred back to [place], we will be glad to see you.     Thank you for choosing [facility] to provide your oncology and hematology care.  To afford each patient quality time with our providers, please arrive at least 15 minutes before your scheduled appointment time.  With your help, our goal is to use those 15 minutes to complete the necessary work-up to ensure our physicians have the information they need to help with your evaluation and healthcare recommendations.    Effective January 1st, 2014, we ask that you re-schedule your appointment with our physicians should you arrive 10 or more minutes late for your appointment.  We strive to give you quality time with our providers, and arriving late affects you and other patients whose appointments are after yours.    Again, thank you for choosing [facility].  Our hope is that these requests will decrease the amount of time that you wait before being seen by our physicians.       _____________________________________________________________  Should you have questions after your visit to [facility], please contact our office at [phone] between the hours of 8:30 a.m. and 5:00 p.m.  Voicemails left after 4:30 p.m. will not be returned until the following business day.  For prescription refill requests, have your pharmacy contact our office with your prescription refill request.

## 2013-09-04 ENCOUNTER — Encounter: Payer: Self-pay | Admitting: Family Medicine

## 2013-09-04 ENCOUNTER — Ambulatory Visit (INDEPENDENT_AMBULATORY_CARE_PROVIDER_SITE_OTHER): Payer: Medicaid Other | Admitting: Family Medicine

## 2013-09-04 VITALS — BP 140/86 | HR 76 | Temp 98.1°F | Resp 14 | Ht 64.0 in | Wt 253.0 lb

## 2013-09-04 DIAGNOSIS — J019 Acute sinusitis, unspecified: Secondary | ICD-10-CM | POA: Insufficient documentation

## 2013-09-04 DIAGNOSIS — I10 Essential (primary) hypertension: Secondary | ICD-10-CM

## 2013-09-04 MED ORDER — AMOXICILLIN-POT CLAVULANATE 875-125 MG PO TABS: 1.0000 | ORAL_TABLET | Freq: Two times a day (BID) | ORAL | Status: DC

## 2013-09-04 MED ORDER — LORATADINE 10 MG PO TABS: 10.0000 mg | ORAL_TABLET | Freq: Every day | ORAL | Status: DC

## 2013-09-04 MED ORDER — FLUCONAZOLE 150 MG PO TABS: 150.0000 mg | ORAL_TABLET | Freq: Once | ORAL | Status: DC

## 2013-09-04 NOTE — Progress Notes (Signed)
Patient ID: [name], female   DOB: 09/20/1980, 33 y.o.   MRN: [id number]   Subjective:    Patient ID: [name], female    DOB: January 02, 1981, 33 y.o.   MRN: [id number]  Patient presents for sinus pain  patient here with sinus pressure and drainage for the past 2 weeks. Initially started as her allergy she's been using her Flonase and noticed that the symptoms were getting worse. She has pain and tenderness around her eyes as well as in the sinus region. She has been taken Sudafed as well as Tylenol cold and sinus medication with minimal improvement. She's not had any cough. No change in vision. No nausea vomiting    Review Of Systems:  GEN- denies fatigue, fever, weight loss,weakness, recent illness HEENT- denies eye drainage, change in vision,+ nasal discharge, CVS- denies chest pain, palpitations RESP- denies SOB, cough, wheeze ABD- denies N/V, change in stools, abd pain GU- denies dysuria, hematuria, dribbling, incontinence MSK- denies joint pain, muscle aches, injury Neuro- denies headache, dizziness, syncope, seizure activity       Objective:    BP 140/86  Pulse 76  Temp(Src) 98.1 F (36.7 C) (Oral)  Resp 14  Ht 5\' 4"  (1.626 m)  Wt 253 lb (114.76 kg)  BMI 43.41 kg/m2 GEN- NAD, alert and oriented x3 HEENT- PERRL, EOMI, non injected sclera, pink conjunctiva, MMM, oropharynx mild injection, TM clear bilat no effusion,  + maxillary sinus tenderness, inflammed turbinates,  Nasal drainage  Neck- Supple, no LAD CVS- RRR, no murmur RESP-CTAB EXT- No edema Pulses- Radial 2+          Assessment & Plan:      Problem List Items Addressed This Visit   Hypertension   Acute sinusitis - Primary   Relevant Medications      AMOXICILLIN-POT CLAVULANATE 875-125 MG PO TABS      fluconazole (DIFLUCAN) tablet 150 mg      loratadine (CLARITIN) tablet 10 mg      Note: This dictation was prepared with Dragon dictation along with smaller Company secretary.  Any transcriptional errors that result from this process are unintentional.

## 2013-09-04 NOTE — Patient Instructions (Signed)
Antibiotics and nasal saline, flonase Take claritin Ibuprofen 600mg  as needed for headache F/U 4 months

## 2013-10-04 ENCOUNTER — Encounter (HOSPITAL_COMMUNITY): Payer: Self-pay | Admitting: Emergency Medicine

## 2013-10-04 ENCOUNTER — Emergency Department (HOSPITAL_COMMUNITY)
Admission: EM | Admit: 2013-10-04 | Discharge: 2013-10-04 | Disposition: A | Discharge status: Home / Self Care | Payer: Medicaid Other | Attending: Emergency Medicine | Admitting: Emergency Medicine

## 2013-10-04 ENCOUNTER — Emergency Department (HOSPITAL_COMMUNITY): Payer: Medicaid Other

## 2013-10-04 DIAGNOSIS — Y9389 Activity, other specified: Secondary | ICD-10-CM | POA: Insufficient documentation

## 2013-10-04 DIAGNOSIS — Z79899 Other long term (current) drug therapy: Secondary | ICD-10-CM | POA: Insufficient documentation

## 2013-10-04 DIAGNOSIS — I10 Essential (primary) hypertension: Secondary | ICD-10-CM | POA: Insufficient documentation

## 2013-10-04 DIAGNOSIS — S199XXA Unspecified injury of neck, initial encounter: Secondary | ICD-10-CM

## 2013-10-04 DIAGNOSIS — E669 Obesity, unspecified: Secondary | ICD-10-CM | POA: Insufficient documentation

## 2013-10-04 DIAGNOSIS — IMO0002 Reserved for concepts with insufficient information to code with codable children: Secondary | ICD-10-CM | POA: Insufficient documentation

## 2013-10-04 DIAGNOSIS — Z8719 Personal history of other diseases of the digestive system: Secondary | ICD-10-CM | POA: Insufficient documentation

## 2013-10-04 DIAGNOSIS — J45909 Unspecified asthma, uncomplicated: Secondary | ICD-10-CM | POA: Insufficient documentation

## 2013-10-04 DIAGNOSIS — M4120 Other idiopathic scoliosis, site unspecified: Secondary | ICD-10-CM | POA: Insufficient documentation

## 2013-10-04 DIAGNOSIS — G709 Myoneural disorder, unspecified: Secondary | ICD-10-CM | POA: Insufficient documentation

## 2013-10-04 DIAGNOSIS — Z862 Personal history of diseases of the blood and blood-forming organs and certain disorders involving the immune mechanism: Secondary | ICD-10-CM | POA: Insufficient documentation

## 2013-10-04 DIAGNOSIS — F411 Generalized anxiety disorder: Secondary | ICD-10-CM | POA: Insufficient documentation

## 2013-10-04 DIAGNOSIS — Y929 Unspecified place or not applicable: Secondary | ICD-10-CM | POA: Insufficient documentation

## 2013-10-04 DIAGNOSIS — X500XXA Overexertion from strenuous movement or load, initial encounter: Secondary | ICD-10-CM | POA: Insufficient documentation

## 2013-10-04 DIAGNOSIS — S0993XA Unspecified injury of face, initial encounter: Secondary | ICD-10-CM | POA: Insufficient documentation

## 2013-10-04 DIAGNOSIS — Z8742 Personal history of other diseases of the female genital tract: Secondary | ICD-10-CM | POA: Insufficient documentation

## 2013-10-04 DIAGNOSIS — S8392XA Sprain of unspecified site of left knee, initial encounter: Secondary | ICD-10-CM

## 2013-10-04 MED ORDER — IBUPROFEN 800 MG PO TABS: 800.0000 mg | ORAL_TABLET | Freq: Three times a day (TID) | ORAL | Status: DC

## 2013-10-04 MED ORDER — HYDROCODONE-ACETAMINOPHEN 5-325 MG PO TABS: ORAL_TABLET | ORAL | Status: DC

## 2013-10-04 MED ORDER — OXYCODONE-ACETAMINOPHEN 5-325 MG PO TABS
1.0000 | ORAL_TABLET | Freq: Once | ORAL | Status: AC
  Administered 2013-10-04: 1 via ORAL
  Filled 2013-10-04: qty 1

## 2013-10-04 MED ORDER — IBUPROFEN 800 MG PO TABS
800.0000 mg | ORAL_TABLET | Freq: Once | ORAL | Status: AC
  Administered 2013-10-04: 800 mg via ORAL
  Filled 2013-10-04: qty 1

## 2013-10-04 NOTE — ED Notes (Signed)
Pt verbalized understanding no driving within 4 hours of taking pain med due to med causes drowsiness

## 2013-10-04 NOTE — Discharge Instructions (Signed)
Knee Sprain A knee sprain is a tear in the strong bands of tissue that connect the bones (ligaments) of your knee. HOME CARE  Raise (elevate) your injured knee to lessen puffiness (swelling).  To ease pain and puffiness, put ice on the injured area.  Put ice in a plastic bag.  Place a towel between your skin and the bag.  Leave the ice on for 20 minutes, 2 3 times a day.  Only take medicine as told by your doctor.  Do not leave your knee unprotected until pain and stiffness go away (usually 4 6 weeks).  If you have a cast or splint, do not get it wet. If your doctor told you to not take it off, cover it with a plastic bag when you shower or bathe. Do not swim.  Your doctor may have you do exercises to prevent or limit permanent weakness and stiffness. GET HELP RIGHT AWAY IF:   Your cast or splint becomes damaged.  Your pain gets worse.  You have a lot of pain, puffiness, or numbness below the cast or splint. MAKE SURE YOU:   Understand these instructions.  Will watch your condition.  Will get help right away if you are not doing well or get worse. Document Released: 04/04/2009 Document Revised: 02/04/2013 Document Reviewed: 12/23/2012 [facility] Patient Information 2014 [place].

## 2013-10-04 NOTE — ED Notes (Signed)
Moving about in bed, felt left knee "pop". Now painful to walk, pain 8/10

## 2013-10-06 ENCOUNTER — Telehealth: Payer: Self-pay | Admitting: Orthopedic Surgery

## 2013-10-06 NOTE — Telephone Encounter (Signed)
Call received today 10/06/13 from patient following Emergency Room visit at [facility], 10/04/13 for problem of knee sprain;  - requests appointment.  Discussed appointment scheduling, and reviewed insurance, which requires referral from primary care physician.  Also relayed to patient that first available is at end of June due to Dr Harrison's upcoming days out of office. Patient may opt to request primary care to refer to orthopedic specialist who would have been on call at [facility] over weekend, if unable to wait for next available appointment with Dr. Aline Brochure.  Patient's phone# is [phone].

## 2013-10-07 NOTE — ED Provider Notes (Signed)
CSN: [id number]     Arrival date & time 10/04/13  1949 History   First MD Initiated Contact with Patient 10/04/13 2129     Chief Complaint  Patient presents with  . Knee Pain     (Consider location/radiation/quality/duration/timing/severity/associated sxs/prior Treatment) Patient is a 33 y.o. female presenting with knee pain. The history is provided by the patient.  Knee Pain Location:  Knee Time since incident:  2 days Injury: yes   Mechanism of injury comment:  Twisting , no h/o fall Knee location:  L knee Pain details:    Quality:  Aching and throbbing   Radiates to:  Does not radiate   Severity:  Moderate   Onset quality:  Sudden   Timing:  Constant   Progression:  Unchanged Chronicity:  Recurrent Dislocation: no   Foreign body present:  No foreign bodies Prior injury to area: previous left knee pain that is intermittent. Relieved by:  Nothing Worsened by:  Activity and bearing weight Ineffective treatments:  NSAIDs Associated symptoms: neck pain and stiffness   Associated symptoms: no back pain, no decreased ROM, no fever, no numbness, no swelling and no tingling     Past Medical History  Diagnosis Date  . Anxiety   . Obesity   . Asthma   . Autoimmune disease, not elsewhere classified(279.49) 2013    Non specific- [facility], abnormal Bone Marrow signal  . Hypertension   . Allergy     banana/pineapple  . Anemia     post pregnancy  . GERD (gastroesophageal reflux disease)   . Neuromuscular disorder     lower back and bilat leg pain  . Headache(784.0)   . Scoliosis   . Breast mass, right 08/18/2012  . Breast lump in female 11/28/2012    Has tender round mass at 10-11 0'clock right breast 4 finger breaths from areola will [place]  . Polyclonal gammopathy 07/26/2013    Insignificant   Past Surgical History  Procedure Laterality Date  . Tonsillectomy    . Dilation and curettage of uterus     Family History  Problem Relation Age of Onset  . Hypertension  Mother   . Diabetes Mother   . Heart disease Mother   . Diabetes Father   . Cancer Paternal Aunt     breast  . Hypertension Maternal Grandmother    History  Substance Use Topics  . Smoking status: Never Smoker   . Smokeless tobacco: Never Used  . Alcohol Use: No   OB History   Grav Para Term Preterm Abortions TAB SAB Ect Mult Living   2 1   1  1   1      Review of Systems  Constitutional: Negative for fever and chills.  Genitourinary: Negative for dysuria and difficulty urinating.  Musculoskeletal: Positive for arthralgias, joint swelling, neck pain and stiffness. Negative for back pain.  Skin: Negative for color change and wound.  All other systems reviewed and are negative.     Allergies  Banana and Pineapple  Home Medications   Prior to Admission medications   Medication Sig Start Date End Date Taking? Authorizing Provider  albuterol (PROVENTIL HFA;VENTOLIN HFA) 108 (90 BASE) MCG/ACT inhaler Inhale 2 puffs into the lungs every 4 (four) hours as needed. 02/05/13   Fayrene Helper, MD  amoxicillin-clavulanate (AUGMENTIN) 875-125 MG per tablet Take 1 tablet by mouth 2 (two) times daily. 09/04/13   Alycia Rossetti, MD  beclomethasone (QVAR) 40 MCG/ACT inhaler Inhale 2 puffs into the lungs  2 (two) times daily. 02/05/13   Fayrene Helper, MD  ergocalciferol (VITAMIN D2) 50000 UNITS capsule Take 1 capsule (50,000 Units total) by mouth once a week. One capsule once weekly 07/21/12   Fayrene Helper, MD  fluconazole (DIFLUCAN) 150 MG tablet Take 1 tablet (150 mg total) by mouth once. 09/04/13   Alycia Rossetti, MD  fluticasone (FLONASE) 50 MCG/ACT nasal spray Place 2 sprays into both nostrils daily. 05/27/13   Alycia Rossetti, MD  gabapentin (NEURONTIN) 100 MG capsule One tablet daily at bedtime 07/17/12 02/06/16  Fayrene Helper, MD  hydrochlorothiazide (MICROZIDE) 12.5 MG capsule Take 1 capsule (12.5 mg total) by mouth daily. 02/05/13   Fayrene Helper, MD   HYDROcodone-acetaminophen (NORCO/VICODIN) 5-325 MG per tablet Take one-two tabs po q 4-6 hrs prn pain 10/04/13   Qunisha Bryk L. Eriberto Felch, PA-C  ibuprofen (ADVIL,MOTRIN) 800 MG tablet Take 1 tablet (800 mg total) by mouth 3 (three) times daily. Take with food 10/04/13   Dariel Betzer L. Niza Soderholm, PA-C  loratadine (CLARITIN) 10 MG tablet Take 1 tablet (10 mg total) by mouth daily. 09/04/13   Alycia Rossetti, MD  megestrol (MEGACE) 40 MG tablet TAKE 3 TABLETS FOR 5 DAYS, 2 TABLETS FOR 5 DAYS, THEN 1 TABLET DAILY. 03/30/13   Estill Dooms, NP  nabumetone (RELAFEN) 750 MG tablet Take 750 mg by mouth 2 (two) times daily.    Historical Provider, MD  ondansetron (ZOFRAN) 4 MG tablet Take 1 tablet (4 mg total) by mouth daily as needed for nausea. 02/05/13 03/07/14  Fayrene Helper, MD  potassium chloride (K-DUR) 10 MEQ tablet Take 1 tablet (10 mEq total) by mouth daily. 07/17/12   Fayrene Helper, MD  venlafaxine XR (EFFEXOR XR) 37.5 MG 24 hr capsule Take 1 capsule (37.5 mg total) by mouth daily. 07/17/12 09/05/15  Fayrene Helper, MD  zolpidem (AMBIEN) 5 MG tablet Take 1 tablet (5 mg total) by mouth at bedtime as needed for sleep. 02/05/13 02/05/14  Fayrene Helper, MD   BP 144/89  Pulse 102  Temp(Src) 98.3 F (36.8 C) (Oral)  Resp 18  Ht 5\' 4"  (1.626 m)  Wt 250 lb (113.399 kg)  BMI 42.89 kg/m2  SpO2 99%  LMP 09/16/2013 Physical Exam  Nursing note and vitals reviewed. Constitutional: She is oriented to person, place, and time. She appears well-developed and well-nourished. No distress.  Cardiovascular: Normal rate, regular rhythm, normal heart sounds and intact distal pulses.   Pulmonary/Chest: Effort normal and breath sounds normal. No respiratory distress.  Musculoskeletal: She exhibits tenderness.  Diffuse ttp of the anterior left knee.  No erythema, effusion, or step-off deformity.  DP pulse brisk, distal sensation intact. Calf is soft and NT. Compartments of the left leg are soft  Neurological: She is  alert and oriented to person, place, and time. She exhibits normal muscle tone. Coordination normal.  Skin: Skin is warm and dry. No erythema.    ED Course  Procedures (including critical care time) Labs Review Labs Reviewed - No data to display  Imaging Review Dg Knee Complete 4 Views Left  10/04/2013   CLINICAL DATA:  Pain  EXAM: LEFT KNEE - COMPLETE 4+ VIEW  COMPARISON:  None.  FINDINGS: Frontal, lateral, and bilateral oblique views were obtained. There is no fracture, dislocation, or effusion. Joint spaces appear intact. No erosive change.  IMPRESSION: No abnormality noted.   Electronically Signed   By: Lowella Grip M.D.   On: 10/04/2013 21:59  EKG Interpretation None      MDM   Final diagnoses:  Sprain of left knee    Pt is well appearing, VSS.  No concerning sx's for septic joint.  Pain likely related to sprain.  No h/o fall.  NV intact.  Knee immob applied for comfort.  Pt agrees to sx treatment with ibuprofen and vicodin.  Given referral for ortho.  Pt agrees to plan and appears stable for d/c    Danna Sewell L. Vanessa Cl[place], PA-C 10/07/13 1925

## 2013-10-08 NOTE — ED Provider Notes (Signed)
Medical screening examination/treatment/procedure(s) were performed by non-physician practitioner and as supervising physician I was immediately available for consultation/collaboration.   EKG Interpretation None        Fredia Sorrow, MD 10/08/13 1101

## 2013-10-13 ENCOUNTER — Ambulatory Visit (INDEPENDENT_AMBULATORY_CARE_PROVIDER_SITE_OTHER): Payer: Medicaid Other | Admitting: Family Medicine

## 2013-10-13 ENCOUNTER — Encounter: Payer: Self-pay | Admitting: Family Medicine

## 2013-10-13 VITALS — BP 134/78 | HR 78 | Temp 98.2°F | Resp 16 | Ht 65.0 in | Wt 262.0 lb

## 2013-10-13 DIAGNOSIS — M25569 Pain in unspecified knee: Secondary | ICD-10-CM

## 2013-10-13 DIAGNOSIS — IMO0002 Reserved for concepts with insufficient information to code with codable children: Secondary | ICD-10-CM

## 2013-10-13 DIAGNOSIS — M25562 Pain in left knee: Secondary | ICD-10-CM | POA: Insufficient documentation

## 2013-10-13 DIAGNOSIS — I10 Essential (primary) hypertension: Secondary | ICD-10-CM

## 2013-10-13 DIAGNOSIS — S8390XA Sprain of unspecified site of unspecified knee, initial encounter: Secondary | ICD-10-CM | POA: Insufficient documentation

## 2013-10-13 MED ORDER — MELOXICAM 7.5 MG PO TABS: 7.5000 mg | ORAL_TABLET | Freq: Every day | ORAL | Status: DC

## 2013-10-13 MED ORDER — HYDROCHLOROTHIAZIDE 12.5 MG PO CAPS: 12.5000 mg | ORAL_CAPSULE | Freq: Every day | ORAL | Status: DC

## 2013-10-13 MED ORDER — HYDROCODONE-ACETAMINOPHEN 5-325 MG PO TABS: ORAL_TABLET | ORAL | Status: DC

## 2013-10-13 MED ORDER — ZOLPIDEM TARTRATE 5 MG PO TABS
5.0000 mg | ORAL_TABLET | Freq: Every evening | ORAL | Status: DC | PRN
Start: 2013-10-13 — End: 2014-02-16

## 2013-10-13 MED ORDER — POTASSIUM CHLORIDE ER 10 MEQ PO TBCR: 10.0000 meq | EXTENDED_RELEASE_TABLET | Freq: Every day | ORAL | Status: DC

## 2013-10-13 NOTE — Assessment & Plan Note (Signed)
Concern for ligamental sprain in her knee she has decreased range of motion and pain with ambulation. 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 She's also not improved over the past 2 weeks

## 2013-10-13 NOTE — Assessment & Plan Note (Signed)
Well controlled on HCTZ, refilled

## 2013-10-13 NOTE — Progress Notes (Signed)
Patient ID: [name], female   DOB: 1980-11-09, 33 y.o.   MRN: [id number]   Subjective:    Patient ID: [name], female    DOB: [birth year], 33 y.o.   MRN: [id number]  Patient presents for L Knee pain  patient here secondary to left knee pain. She requires a referral to orthopedics. She was seen in the emergency room on June 7 after she felt a pop in her knee while rolling on the bed on June 6. She states her knees he typically pop this felt different initiate swelling significant pain afterwards. In the emergency room she had an x-ray which was benign she was diagnosed with a sprain of the knee and told to followup orthopedics. She was given ibuprofen 800 mg as well as hydrocodone for pain. She continues to have significant pain with walking states at that time she is even used a cane she is unable to bend her knee completely backwards, the pain is mostly at the bend of her knee. She's also had some swelling and felt some warmth to the knee it couple days ago. Meds she was also given a knee brace however when she wears this for a long period of time because her ankles swell   Review Of Systems:  GEN- denies fatigue, fever, weight loss,weakness, recent illness CVS- denies chest pain, palpitations RESP- denies SOB, cough, wheeze MSK-+ joint pain, muscle aches, injury        Objective:    BP 134/78  Pulse 78  Temp(Src) 98.2 F (36.8 C) (Oral)  Resp 16  Ht 5\' 5"  (1.651 m)  Wt 262 lb (118.842 kg)  BMI 43.60 kg/m2  LMP 10/12/2013 GEN- NAD, alert and oriented x3 MSK- Right knee- normal appearance, no effusion, good ROM, Left knee- mild effusion, no warmth, decreased ROM, pain with flexion of knee, TTP inferior joint line Ext- No edema, DP 2+       Assessment & Plan:      Problem List Items Addressed This Visit   None      Note: This dictation was prepared with Dragon dictation along with smaller phrase technology. Any transcriptional errors that result  from this process are unintentional.

## 2013-10-13 NOTE — Patient Instructions (Signed)
New anti-inflammatory once a day  Pain medication Referral to orthopedics F/U 4 months

## 2013-10-23 NOTE — Telephone Encounter (Signed)
No further response from patient and no referral received as of this date, 10/23/13.

## 2013-11-16 ENCOUNTER — Ambulatory Visit (INDEPENDENT_AMBULATORY_CARE_PROVIDER_SITE_OTHER): Payer: Medicaid Other | Admitting: Obstetrics & Gynecology

## 2013-11-16 ENCOUNTER — Encounter: Payer: Self-pay | Admitting: Obstetrics & Gynecology

## 2013-11-16 VITALS — BP 128/70 | Ht 65.0 in | Wt 266.0 lb

## 2013-11-16 DIAGNOSIS — Z3202 Encounter for pregnancy test, result negative: Secondary | ICD-10-CM

## 2013-11-16 DIAGNOSIS — N644 Mastodynia: Secondary | ICD-10-CM

## 2013-11-16 LAB — POCT URINE PREGNANCY: Preg Test, Ur: NEGATIVE

## 2013-11-16 MED ORDER — MEDROXYPROGESTERONE ACETATE 10 MG PO TABS: 10.0000 mg | ORAL_TABLET | Freq: Every day | ORAL | Status: DC

## 2013-11-16 NOTE — Addendum Note (Signed)
Addended by: Doyne Keel on: 11/16/2013 10:37 AM   Modules accepted: Orders

## 2013-11-16 NOTE — Progress Notes (Signed)
Patient ID: [name], female   DOB: 10/23/1980, 33 y.o.   MRN: [id number] Chief Complaint  Patient presents with  . GYN VISIT    Breasts sore and swollen.    HPI Period about 3 weeks late UPT negative Breast nipples sore x 3 weeks  ROS No burning with urination, frequency or urgency No nausea, vomiting or diarrhea Nor fever chills or other constitutional symptoms   Blood pressure 128/70, height 5\' 5"  (1.651 m), weight 266 lb (120.657 kg), last menstrual period 09/28/2013.  EXAM Breast No lesions masses or skin changes  Assessment/Plan:  Mastodynia due to late menses: provera x 10days

## 2013-12-28 ENCOUNTER — Ambulatory Visit: Payer: Medicaid Other | Admitting: Family Medicine

## 2013-12-29 ENCOUNTER — Encounter: Payer: Self-pay | Admitting: Family Medicine

## 2013-12-29 ENCOUNTER — Ambulatory Visit (INDEPENDENT_AMBULATORY_CARE_PROVIDER_SITE_OTHER): Payer: Medicaid Other | Admitting: Family Medicine

## 2013-12-29 VITALS — BP 130/76 | HR 82 | Temp 98.3°F | Resp 14 | Ht 66.0 in | Wt 263.0 lb

## 2013-12-29 DIAGNOSIS — J4540 Moderate persistent asthma, uncomplicated: Secondary | ICD-10-CM

## 2013-12-29 DIAGNOSIS — M549 Dorsalgia, unspecified: Secondary | ICD-10-CM

## 2013-12-29 DIAGNOSIS — J45909 Unspecified asthma, uncomplicated: Secondary | ICD-10-CM

## 2013-12-29 MED ORDER — IBUPROFEN 600 MG PO TABS: 600.0000 mg | ORAL_TABLET | Freq: Three times a day (TID) | ORAL | Status: DC | PRN

## 2013-12-29 MED ORDER — GABAPENTIN 100 MG PO CAPS: ORAL_CAPSULE | ORAL | Status: DC

## 2013-12-29 MED ORDER — HYDROCODONE-ACETAMINOPHEN 5-325 MG PO TABS: ORAL_TABLET | ORAL | Status: DC

## 2013-12-29 NOTE — Progress Notes (Signed)
Patient ID: [name], female   DOB: [birth year], 33 y.o.   MRN: [id number]   Subjective:    Patient ID: [name], female    DOB: [birth year], 33 y.o.   MRN: [id number]  Patient presents for Back Pain  patient here to followup asthma. She did few days was very HEENT outside where she had to use her albuterol little more otherwise has been good with her Qvar. She does complain of back pain which is worsened over the past 2 weeks. 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 She's also her menstrual cycle which tends to make her back pain worse. She's been taking ibuprofen 400 mg which helps some. She's been out of her gabapentin for the past 2 months and this was also help her back pain at the time. She denies any change in bowel or bladder denies any paresthesia in her lower extremities    Review Of Systems:  GEN- denies fatigue, fever, weight loss,weakness, recent illness HEENT- denies eye drainage, change in vision, nasal discharge, CVS- denies chest pain, palpitations RESP- denies SOB, cough, wheeze ABD- denies N/V, change in stools, abd pain GU- denies dysuria, hematuria, dribbling, incontinence MSK- +joint pain, muscle aches, injury Neuro- denies headache, dizziness, syncope, seizure activity       Objective:    BP 130/76  Pulse 82  Temp(Src) 98.3 F (36.8 C) (Oral)  Resp 14  Ht 5\' 6"  (1.676 m)  Wt 263 lb (119.296 kg)  BMI 42.47 kg/m2  LMP 12/26/2013 GEN- NAD, alert and oriented x3 CVS-RRR, no murmur RESP-CTAB MSK- TTP lumbar spine, neg SLR, fair ROM, antalgic gait  Neuro- CNII-XII in tact, normal strength equal bilat, sensation in tact      Assessment & Plan:      Problem List Items Addressed This Visit   BACK PAIN, CHRONIC - Primary     Acute on chronic back pain. She may be getting some increased pain secondary to her heavy. She was followup with her GYN for this matter. I've given her a prescription for  ibuprofen 600 mg as this may help the heavy periods as well. I've also given her a short-term prescription for hydrocodone to use heating pad. We've given her a note for work she can return tomorrow of note she also restart gabapentin 100 mg at bedtime which was helping    Relevant Medications      ibuprofen (MOTRIN) tablet      HYDROcodone-acetaminophen (NORCO/VICODIN) 5-325 MG per tablet      gabapentin (NEURONTIN) capsule   Asthma in adult     Currently stable no change to meds, will need flu shot        Note: This dictation was prepared with Dragon dictation along with smaller phrase technology. Any transcriptional errors that result from this process are unintentional.

## 2013-12-29 NOTE — Assessment & Plan Note (Signed)
Acute on chronic back pain. She may be getting some increased pain secondary to her heavy. She was followup with her GYN for this matter. I've given her a prescription for ibuprofen 600 mg as this may help the heavy periods as well. I've also given her a short-term prescription for hydrocodone to use heating pad. We've given her a note for work she can return tomorrow of note she also restart gabapentin 100 mg at bedtime which was helping

## 2013-12-29 NOTE — Assessment & Plan Note (Signed)
Currently stable no change to meds, will need flu shot

## 2013-12-29 NOTE — Patient Instructions (Addendum)
Take ibuprofen , use heating pad, pain meds Restart gabapentin at bedtime Call GYN Note for work for today, can return tomorrow 12/30/13 F/U as needed

## 2014-01-05 ENCOUNTER — Ambulatory Visit (INDEPENDENT_AMBULATORY_CARE_PROVIDER_SITE_OTHER): Payer: Medicaid Other | Admitting: Adult Health

## 2014-01-05 ENCOUNTER — Encounter: Payer: Self-pay | Admitting: Adult Health

## 2014-01-05 VITALS — BP 180/80 | Ht 66.0 in | Wt 264.0 lb

## 2014-01-05 DIAGNOSIS — N921 Excessive and frequent menstruation with irregular cycle: Secondary | ICD-10-CM

## 2014-01-05 DIAGNOSIS — IMO0002 Reserved for concepts with insufficient information to code with codable children: Secondary | ICD-10-CM

## 2014-01-05 DIAGNOSIS — N920 Excessive and frequent menstruation with regular cycle: Secondary | ICD-10-CM | POA: Insufficient documentation

## 2014-01-05 HISTORY — DX: Reserved for concepts with insufficient information to code with codable children: IMO0002

## 2014-01-05 HISTORY — DX: Excessive and frequent menstruation with regular cycle: N92.0

## 2014-01-05 LAB — POCT URINE PREGNANCY: Preg Test, Ur: NEGATIVE

## 2014-01-05 NOTE — Addendum Note (Signed)
Addended by: Derrek Monaco A on: 01/05/2014 04:05 PM   Modules accepted: Orders

## 2014-01-05 NOTE — Patient Instructions (Signed)

## 2014-01-05 NOTE — Progress Notes (Signed)
Subjective:     Patient ID: [name], female   DOB: 22-May-1980, 33 y.o.   MRN: [id number]  HPI Tearra is a 33 year old black female, in complaining of heavy bleeding and bleeding longer and every 2 weeks for past 2 months, had used megace in past but stopped it in May.And sex hurts at times, has been with partner 1 years.She desires 1 more child.  Review of Systems See HPI Reviewed past medical,surgical, social and family history. Reviewed medications and allergies.     Objective:   Physical Exam BP 180/80  Ht 5\' 6"  (1.676 m)  Wt 264 lb (119.75 kg)  BMI 42.63 kg/m2  LMP 08/29/2015UPT negative,Skin warm and dry.Pelvic: external genitalia is normal in appearance, vagina:scant discharge without odor, cervix:smooth and bulbous, uterus: normal size, shape and contour, mildly tender, no masses felt, adnexa: no masses or tenderness noted. GC/CHL obtained.     Assessment:     Menorrhagia Dyspareunia     Plan:     Return in 1 week for gyn [place] and see me Check CBC,CMP,TSH, GC/CHL Review handout on menorrhagia and on fertility

## 2014-01-06 ENCOUNTER — Telehealth: Payer: Self-pay | Admitting: Adult Health

## 2014-01-06 LAB — CBC
HCT: 40.2 % (ref 36.0–46.0)
Hemoglobin: 13.9 g/dL (ref 12.0–15.0)
MCH: 30.5 pg (ref 26.0–34.0)
MCHC: 34.6 g/dL (ref 30.0–36.0)
MCV: 88.4 fL (ref 78.0–100.0)
Platelets: 378 10*3/uL (ref 150–400)
RBC: 4.55 MIL/uL (ref 3.87–5.11)
RDW: 13.6 % (ref 11.5–15.5)
WBC: 9.8 10*3/uL (ref 4.0–10.5)

## 2014-01-06 LAB — COMPREHENSIVE METABOLIC PANEL
ALT: <8 U/L (ref 0–35)
AST: 11 U/L (ref 0–37)
Albumin: 3.8 g/dL (ref 3.5–5.2)
Alkaline Phosphatase: 86 U/L (ref 39–117)
BUN: 9 mg/dL (ref 6–23)
CO2: 27 mEq/L (ref 19–32)
CREATININE: 0.75 mg/dL (ref 0.50–1.10)
Calcium: 9.2 mg/dL (ref 8.4–10.5)
Chloride: 101 mEq/L (ref 96–112)
Glucose, Bld: 78 mg/dL (ref 70–99)
Potassium: 3.6 mEq/L (ref 3.5–5.3)
Sodium: 136 mEq/L (ref 135–145)
Total Bilirubin: 0.5 mg/dL (ref 0.2–1.2)
Total Protein: 6.5 g/dL (ref 6.0–8.3)

## 2014-01-06 LAB — GC/CHLAMYDIA PROBE AMP
CT Probe RNA: NEGATIVE
GC PROBE AMP APTIMA: NEGATIVE

## 2014-01-06 LAB — TSH: TSH: 0.747 u[IU]/mL (ref 0.350–4.500)

## 2014-01-06 NOTE — Telephone Encounter (Signed)
Pt aware of labs  

## 2014-01-12 ENCOUNTER — Ambulatory Visit (INDEPENDENT_AMBULATORY_CARE_PROVIDER_SITE_OTHER): Payer: Medicaid Other

## 2014-01-12 ENCOUNTER — Encounter: Payer: Self-pay | Admitting: Adult Health

## 2014-01-12 ENCOUNTER — Ambulatory Visit (INDEPENDENT_AMBULATORY_CARE_PROVIDER_SITE_OTHER): Payer: Medicaid Other | Admitting: Adult Health

## 2014-01-12 VITALS — BP 138/84 | Ht 66.0 in | Wt 262.0 lb

## 2014-01-12 DIAGNOSIS — N920 Excessive and frequent menstruation with regular cycle: Secondary | ICD-10-CM

## 2014-01-12 DIAGNOSIS — Z319 Encounter for procreative management, unspecified: Secondary | ICD-10-CM

## 2014-01-12 DIAGNOSIS — N921 Excessive and frequent menstruation with irregular cycle: Secondary | ICD-10-CM

## 2014-01-12 DIAGNOSIS — N83209 Unspecified ovarian cyst, unspecified side: Secondary | ICD-10-CM | POA: Insufficient documentation

## 2014-01-12 DIAGNOSIS — IMO0002 Reserved for concepts with insufficient information to code with codable children: Secondary | ICD-10-CM

## 2014-01-12 HISTORY — DX: Unspecified ovarian cyst, unspecified side: N83.209

## 2014-01-12 MED ORDER — PRENATAL PLUS 27-1 MG PO TABS: 1.0000 | ORAL_TABLET | Freq: Every day | ORAL | Status: DC

## 2014-01-12 NOTE — Progress Notes (Signed)
Subjective:     Patient ID: [name], female   DOB: 1980/11/02, 33 y.o.   MRN: [id number]  HPI [name] is a 33 year old black female in for [place] for menorrhagia and dyspareunia.  Review of Systems See HPI Reviewed past medical,surgical, social and family history. Reviewed medications and allergies.     Objective:   Physical Exam BP 138/84  Ht 5\' 6"  (1.676 m)  Wt 262 lb (118.842 kg)  BMI 42.31 kg/m2  LMP 08/28/2015Reviewed [place] with pt.   Uterus 9.1 x 5.4 x 4.5 cm, anteverted  Endometrium 7.2 mm, symmetrical,  Right ovary 6.0 x 4.4 x 3.1 cm, with 2 cystic masses noted #1=complex with internal debris noted = 3.0 cm and #2=simple=2.6cm  Left ovary 4.0 x 2.9 x 2.6 cm,  Small amount of free fluid noted within the posterior cul-de-sac  pt stated did not feel any tenderness/discomfort during exam  Technician Comments:  Anteverted uterus, Endom=7.21mm, Rt ovary with a simple and complex cyst noted, Lt ovary appears WNL, +Small amount of free fluid noted within the posterior cul-de-sac, pt stated did not feel any tenderness/discomfort during exam  Will follow for now wants to be pregnant in near future.   Assessment:     Menorrhagia Ovarian cyst Dyspareunia Desires pregnancy     Plan:     Rx prenatal plus #30 1 daily with 11 refills Return in 2 months for pap and physical Discussed timing of sex Review handout on preparing for pregnancy

## 2014-01-12 NOTE — Patient Instructions (Signed)
Preparing for Pregnancy Before trying to become pregnant, make an appointment with your health care provider (preconception care). The goal is to help you have a healthy, safe pregnancy. At your first appointment, your health care provider will:   Do a complete physical exam, including a Pap test.  Take a complete medical history.  Give you advice and help you resolve any problems. PRECONCEPTION CHECKLIST Here is a list of the basics to cover with your health care provider at your preconception visit:  Medical history.  Tell your health care provider about any diseases you have had. Many diseases can affect your pregnancy.  Include your partner's medical history and family history.  Make sure you have been tested for sexually transmitted infections (STIs). These can affect your pregnancy. In some cases, they can be passed to your baby. Tell your health care provider about any history of STIs.  Make sure your health care provider knows about any previous problems you have had with conception or pregnancy.  Tell your health care provider about any medicine you take. This includes herbal supplements and over-the-counter medicines.  Make sure all your immunizations are up to date. You may need to make additional appointments.  Ask your health care provider if you need any vaccinations or if there are any you should avoid.  Diet.  It is especially important to eat a healthy, balanced diet with the right nutrients when you are pregnant.  Ask your health care provider to help you get to a healthy weight before pregnancy.  If you are overweight, you are at higher risk for certain complications. These include high blood pressure, diabetes, and preterm birth.  If you are underweight, you are more likely to have a low-birth-weight baby.  Lifestyle.  Tell your health care provider about lifestyle factors such as alcohol use, drug use, or smoking.  Describe any harmful substances you may  be exposed to at work or home. These can include chemicals, pesticides, and radiation.  Mental health.  Let your health care provider know if you have been feeling depressed or anxious.  Let your health care provider know if you have a history of substance abuse.  Let your health care provider know if you do not feel safe at home. HOME INSTRUCTIONS TO PREPARE FOR PREGNANCY Follow your health care provider's advice and instructions.   Keep an accurate record of your menstrual periods. This makes it easier for your health care provider to determine your baby's due date.  Begin taking prenatal vitamins and folic acid supplements daily. Take them as directed by your health care provider.  Eat a balanced diet. Get help from a nutrition counselor if you have questions or need help.  Get regular exercise. Try to be active for at least 30 minutes a day most days of the week.  Quit smoking, if you smoke.  Do not drink alcohol.  Do not take illegal drugs.  Get medical problems, such as diabetes or high blood pressure, under control.  If you have diabetes, make sure you do the following:  Have good blood sugar control. If you have type 1 diabetes, use multiple daily doses of insulin. Do not use split-dose or premixed insulin.  Have an eye exam by a qualified eye care professional trained in caring for people with diabetes.  Get evaluated by your health care provider for cardiovascular disease.  Get to a healthy weight. If you are overweight or obese, reduce your weight with the help of a qualified health   professional such as a Firefighter. Ask your health care provider what the right weight range is for you. HOW DO I KNOW I AM PREGNANT? You may be pregnant if you have been sexually active and you miss your period. Symptoms of early pregnancy include:   Mild cramping.  Very light vaginal bleeding (spotting).  Feeling unusually tired.  Morning sickness. If you have any of  these symptoms, take a home pregnancy test. These tests look for a hormone called human chorionic gonadotropin (hCG) in your urine. Your body begins to make this hormone during early pregnancy. These tests are very accurate. Wait until at least the first day you miss your period to take one. If you get a positive result, call your health care provider to make appointments for prenatal care. WHAT SHOULD I DO IF I BECOME PREGNANT?  Make an appointment with your health care provider by week 12 of your pregnancy at the latest.  Do not smoke. Smoking can be harmful to your baby.  Do not drink alcoholic beverages. Alcohol is related to a number of birth defects.  Avoid toxic odors and chemicals.  You may continue to have sexual intercourse if it does not cause pain or other problems, such as vaginal bleeding. Document Released: 03/29/2008 Document Revised: 08/31/2013 Document Reviewed: 03/23/2013 [facility] Patient Information 2015 [place], [place]. This information is not intended to replace advice given to you by your health care provider. Make sure you discuss any questions you have with your health care provider. Return in 2 months for pap and physical

## 2014-01-25 ENCOUNTER — Telehealth: Payer: Self-pay | Admitting: Adult Health

## 2014-01-25 MED ORDER — NORETHINDRONE 0.35 MG PO TABS: 1.0000 | ORAL_TABLET | Freq: Every day | ORAL | Status: DC

## 2014-01-25 NOTE — Telephone Encounter (Signed)
Bleeding heavy wants to get on the birth control pill to control period, period started Friday start the pill to day,Rx micronor,if still bleeding next week call.

## 2014-02-12 ENCOUNTER — Ambulatory Visit: Payer: Medicaid Other | Admitting: Family Medicine

## 2014-02-16 ENCOUNTER — Encounter: Payer: Self-pay | Admitting: Family Medicine

## 2014-02-16 ENCOUNTER — Ambulatory Visit (INDEPENDENT_AMBULATORY_CARE_PROVIDER_SITE_OTHER): Payer: Medicaid Other | Admitting: Family Medicine

## 2014-02-16 VITALS — BP 128/74 | HR 72 | Temp 98.5°F | Resp 14 | Ht 66.0 in | Wt 264.0 lb

## 2014-02-16 DIAGNOSIS — G8929 Other chronic pain: Secondary | ICD-10-CM

## 2014-02-16 DIAGNOSIS — M545 Low back pain, unspecified: Secondary | ICD-10-CM

## 2014-02-16 DIAGNOSIS — G4700 Insomnia, unspecified: Secondary | ICD-10-CM

## 2014-02-16 DIAGNOSIS — J329 Chronic sinusitis, unspecified: Secondary | ICD-10-CM

## 2014-02-16 DIAGNOSIS — Z23 Encounter for immunization: Secondary | ICD-10-CM

## 2014-02-16 DIAGNOSIS — B9789 Other viral agents as the cause of diseases classified elsewhere: Secondary | ICD-10-CM | POA: Insufficient documentation

## 2014-02-16 DIAGNOSIS — B349 Viral infection, unspecified: Secondary | ICD-10-CM

## 2014-02-16 DIAGNOSIS — E669 Obesity, unspecified: Secondary | ICD-10-CM

## 2014-02-16 DIAGNOSIS — I10 Essential (primary) hypertension: Secondary | ICD-10-CM

## 2014-02-16 LAB — LIPID PANEL
CHOL/HDL RATIO: 2.9 ratio
CHOLESTEROL: 117 mg/dL (ref 0–200)
HDL: 40 mg/dL (ref >39)
LDL Cholesterol: 58 mg/dL (ref 0–99)
Triglycerides: 93 mg/dL (ref <150)
VLDL: 19 mg/dL (ref 0–40)

## 2014-02-16 MED ORDER — ALBUTEROL SULFATE HFA 108 (90 BASE) MCG/ACT IN AERS: 2.0000 | INHALATION_SPRAY | RESPIRATORY_TRACT | Status: DC | PRN

## 2014-02-16 MED ORDER — METHYLPREDNISOLONE (PAK) 4 MG PO TABS: ORAL_TABLET | ORAL | Status: DC

## 2014-02-16 MED ORDER — IBUPROFEN 600 MG PO TABS: 600.0000 mg | ORAL_TABLET | Freq: Three times a day (TID) | ORAL | Status: DC | PRN

## 2014-02-16 MED ORDER — ZOLPIDEM TARTRATE 5 MG PO TABS: 5.0000 mg | ORAL_TABLET | Freq: Every evening | ORAL | Status: DC | PRN

## 2014-02-16 MED ORDER — GABAPENTIN 300 MG PO CAPS: ORAL_CAPSULE | ORAL | Status: DC

## 2014-02-16 MED ORDER — HYDROCODONE-ACETAMINOPHEN 5-325 MG PO TABS: ORAL_TABLET | ORAL | Status: DC

## 2014-02-16 NOTE — Assessment & Plan Note (Signed)
No sign of bacterial infection. She has some clear drainage at this time. She's using her Flonase as well as the Mucinex we did go ahead and give her her flu shot today very mild illness

## 2014-02-16 NOTE — Progress Notes (Signed)
Patient ID: [name], female   DOB: 10-25-1980, 33 y.o.   MRN: [id number]   Subjective:    Patient ID: [name], female    DOB: [birth year], 33 y.o.   MRN: [id number]  Patient presents for 4 month F/U and Sinus Infection  patient here to followup chronic medical problems. She continues to have chronic back pain. She did have epidural injections in the past by Dr. Merlene Laughter which helps but she would like to go to a different practice. She is maintained on gabapentin 100 mg at bedtime occasionally uses hydrocodone and ibuprofen. She had MRI of the lumbar spine in 2013 as well as 2014 which showed no degenerative changes and spinal stenosis which is mild facet hypertrophy and I think her chronic back pain is due to her weight mostly. She did have an abnormal bone marrow signal which was worked up significantly and found to be benign. He's been in physical therapy for her back pain as well with minimal improvement. She denies any change in bowel or bladder denies any radiating symptoms.  She's also had some nasal congestion and drainage for the past 3 days and ear pain. She's not had any fever and minimal cough. She does use her rescue inhaler as needed and is taking Qvar for her asthma. She did use over-the-counter Sudafed which helped her nasal congestion    Review Of Systems:  GEN- denies fatigue, fever, weight loss,weakness, recent illness HEENT- denies eye drainage, change in vision,+ nasal discharge, CVS- denies chest pain, palpitations RESP- denies SOB, +cough, wheeze ABD- denies N/V, change in stools, abd pain GU- denies dysuria, hematuria, dribbling, incontinence MSK- + joint pain, muscle aches, injury Neuro- denies headache, dizziness, syncope, seizure activity       Objective:    BP 128/74  Pulse 72  Temp(Src) 98.5 F (36.9 C) (Oral)  Resp 14  Ht 5\' 6"  (1.676 m)  Wt 264 lb (119.75 kg)  BMI 42.63 kg/m2 GEN- NAD, alert and oriented x3 HEENT- PERRL, EOMI,  non injected sclera, pink conjunctiva, MMM, oropharynx clear, nares clear rhinorrhea, no maxillary sinus tenderness, TM clear no effusion, canals clear  Neck- Supple, no LAD CVS- RRR, no murmur RESP-CTAB MSK- TTP lumbar spine, neg SLR, fair ROM a little stiff today, Neuro- DTR symmetric, normal tone, sensation in tact LE, motor 5/5 bilat EXT- No edema Pulses- Radial 2+        Assessment & Plan:      Problem List Items Addressed This Visit   Viral sinusitis   Relevant Medications      Methylprednisolone (MEDROL PAK) 4 mg po tab   Obesity   Insomnia   Hypertension   Relevant Orders      Lipid panel   Chronic low back pain - Primary   Relevant Medications      HYDROcodone-acetaminophen (NORCO/VICODIN) 5-325 MG per tablet      ibuprofen (MOTRIN) tablet      Methylprednisolone (MEDROL PAK) 4 mg po tab   Other Relevant Orders      Ambulatory referral to Pain Clinic    Other Visit Diagnoses   Bilateral low back pain without sciatica        Relevant Medications       gabapentin (NEURONTIN) capsule       HYDROcodone-acetaminophen (NORCO/VICODIN) 5-325 MG per tablet       ibuprofen (MOTRIN) tablet       Methylprednisolone (MEDROL PAK) 4 mg po tab    Need for prophylactic vaccination  and inoculation against influenza        Relevant Orders       Flu Vaccine QUAD 36+ mos PF IM (Fluarix Quad PF) (Completed)       Note: This dictation was prepared with Dragon dictation along with smaller phrase technology. Any transcriptional errors that result from this process are unintentional.

## 2014-02-16 NOTE — Assessment & Plan Note (Signed)
Blood pressure is well-controlled the change in medication

## 2014-02-16 NOTE — Assessment & Plan Note (Signed)
Continue to work on weight loss. Increase her gabapentin 300 mg at bedtime and given her Medrol Dosepak today. I will refer her for chronic pain management to see if another epidural injection would help. I refilled hydrocodone with only 30 tablets

## 2014-02-16 NOTE — Assessment & Plan Note (Signed)
Continue with as needed Ambien she does not use on a regular basis

## 2014-02-16 NOTE — Patient Instructions (Signed)
Take steroid dosepak Do not take Ibuprofen with the steroids Referral to pain clinic for epidural injections Use flonase and mucinex for sinuses We will call with cholesterol results Flu shot given Work on weight loss F/U 4 months

## 2014-03-01 ENCOUNTER — Encounter: Payer: Self-pay | Admitting: Family Medicine

## 2014-03-18 ENCOUNTER — Other Ambulatory Visit: Payer: Medicaid Other | Admitting: Adult Health

## 2014-03-23 ENCOUNTER — Telehealth: Payer: Self-pay | Admitting: Adult Health

## 2014-03-23 ENCOUNTER — Encounter: Payer: Self-pay | Admitting: Adult Health

## 2014-03-23 ENCOUNTER — Telehealth: Payer: Self-pay | Admitting: Obstetrics and Gynecology

## 2014-03-23 ENCOUNTER — Ambulatory Visit (INDEPENDENT_AMBULATORY_CARE_PROVIDER_SITE_OTHER): Payer: Medicaid Other | Admitting: Adult Health

## 2014-03-23 VITALS — BP 140/90 | Ht 66.0 in | Wt 267.0 lb

## 2014-03-23 DIAGNOSIS — N921 Excessive and frequent menstruation with irregular cycle: Secondary | ICD-10-CM

## 2014-03-23 DIAGNOSIS — N946 Dysmenorrhea, unspecified: Secondary | ICD-10-CM

## 2014-03-23 HISTORY — DX: Dysmenorrhea, unspecified: N94.6

## 2014-03-23 MED ORDER — NAPROXEN SODIUM 550 MG PO TABS: 550.0000 mg | ORAL_TABLET | Freq: Two times a day (BID) | ORAL | Status: DC

## 2014-03-23 MED ORDER — MEGESTROL ACETATE 40 MG PO TABS: ORAL_TABLET | ORAL | Status: DC

## 2014-03-23 NOTE — Telephone Encounter (Signed)
DONE

## 2014-03-23 NOTE — Patient Instructions (Signed)
Dysmenorrhea Menstrual cramps (dysmenorrhea) are caused by the muscles of the uterus tightening (contracting) during a menstrual period. For some women, this discomfort is merely bothersome. For others, dysmenorrhea can be severe enough to interfere with everyday activities for a few days each month. Primary dysmenorrhea is menstrual cramps that last a couple of days when you start having menstrual periods or soon after. This often begins after a teenager starts having her period. As a woman gets older or has a baby, the cramps will usually lessen or disappear. Secondary dysmenorrhea begins later in life, lasts longer, and the pain may be stronger than primary dysmenorrhea. The pain may start before the period and last a few days after the period.  CAUSES  Dysmenorrhea is usually caused by an underlying problem, such as:  The tissue lining the uterus grows outside of the uterus in other areas of the body (endometriosis).  The endometrial tissue, which normally lines the uterus, is found in or grows into the muscular walls of the uterus (adenomyosis).  The pelvic blood vessels are engorged with blood just before the menstrual period (pelvic congestive syndrome).  Overgrowth of cells (polyps) in the lining of the uterus or cervix.  Falling down of the uterus (prolapse) because of loose or stretched ligaments.  Depression.  Bladder problems, infection, or inflammation.  Problems with the intestine, a tumor, or irritable bowel syndrome.  Cancer of the female organs or bladder.  A severely tipped uterus.  A very tight opening or closed cervix.  Noncancerous tumors of the uterus (fibroids).  Pelvic inflammatory disease (PID).  Pelvic scarring (adhesions) from a previous surgery.  Ovarian cyst.  An intrauterine device (IUD) used for birth control. RISK FACTORS You may be at greater risk of dysmenorrhea if:  You are younger than age 62.  You started puberty early.  You have  irregular or heavy bleeding.  You have never given birth.  You have a family history of this problem.  You are a smoker. SIGNS AND SYMPTOMS   Cramping or throbbing pain in your lower abdomen.  Headaches.  Lower back pain.  Nausea or vomiting.  Diarrhea.  Sweating or dizziness.  Loose stools. DIAGNOSIS  A diagnosis is based on your history, symptoms, physical exam, diagnostic tests, or procedures. Diagnostic tests or procedures may include:  Blood tests.  Ultrasonography.  An examination of the lining of the uterus (dilation and curettage, D&C).  An examination inside your abdomen or pelvis with a scope (laparoscopy).  X-rays.  CT scan.  MRI.  An examination inside the bladder with a scope (cystoscopy).  An examination inside the intestine or stomach with a scope (colonoscopy, gastroscopy). TREATMENT  Treatment depends on the cause of the dysmenorrhea. Treatment may include:  Pain medicine prescribed by your health care provider.  Birth control pills or an IUD with progesterone hormone in it.  Hormone replacement therapy.  Nonsteroidal anti-inflammatory drugs (NSAIDs). These may help stop the production of prostaglandins.  Surgery to remove adhesions, endometriosis, ovarian cyst, or fibroids.  Removal of the uterus (hysterectomy).  Progesterone shots to stop the menstrual period.  Cutting the nerves on the sacrum that go to the female organs (presacral neurectomy).  Electric current to the sacral nerves (sacral nerve stimulation).  Antidepressant medicine.  Psychiatric therapy, counseling, or group therapy.  Exercise and physical therapy.  Meditation and yoga therapy.  Acupuncture. HOME CARE INSTRUCTIONS   Only take over-the-counter or prescription medicines as directed by your health care provider.  Place a heating pad  or hot water bottle on your lower back or abdomen. Do not sleep with the heating pad.  Use aerobic exercises, walking,  swimming, biking, and other exercises to help lessen the cramping.  Massage to the lower back or abdomen may help.  Stop smoking.  Avoid alcohol and caffeine. SEEK MEDICAL CARE IF:   Your pain does not get better with medicine.  You have pain with sexual intercourse.  Your pain increases and is not controlled with medicines.  You have abnormal vaginal bleeding with your period.  You develop nausea or vomiting with your period that is not controlled with medicine. SEEK IMMEDIATE MEDICAL CARE IF:  You pass out.  Document Released: 04/16/2005 Document Revised: 12/17/2012 Document Reviewed: 10/02/2012 [employer] Patient Information 2015 [employer]. This information is not intended to replace advice given to you by your health care provider. Make sure you discuss any questions you have with your health care provider. Menorrhagia Menorrhagia is the medical term for when your menstrual periods are heavy or last longer than usual. With menorrhagia, every period you have may cause enough blood loss and cramping that you are unable to maintain your usual activities. CAUSES  In some cases, the cause of heavy periods is unknown, but a number of conditions may cause menorrhagia. Common causes include:  A problem with the hormone-producing thyroid gland (hypothyroid).  Noncancerous growths in the uterus (polyps or fibroids).  An imbalance of the estrogen and progesterone hormones.  One of your ovaries not releasing an egg during one or more months.  Side effects of having an intrauterine device (IUD).  Side effects of some medicines, such as anti-inflammatory medicines or blood thinners.  A bleeding disorder that stops your blood from clotting normally. SIGNS AND SYMPTOMS  During a normal period, bleeding lasts between 4 and 8 days. Signs that your periods are too heavy include:  You routinely have to change your pad or tampon every 1 or 2 hours because it is completely soaked.  You  pass blood clots larger than 1 inch (2.5 cm) in size.  You have bleeding for more than 7 days.  You need to use pads and tampons at the same time because of heavy bleeding.  You need to wake up to change your pads or tampons during the night.  You have symptoms of anemia, such as tiredness, fatigue, or shortness of breath. DIAGNOSIS  Your health care provider will perform a physical exam and ask you questions about your symptoms and menstrual history. Other tests may be ordered based on what the health care provider finds during the exam. These tests can include:  Blood tests. Blood tests are used to check if you are pregnant or have hormonal changes, a bleeding or thyroid disorder, low iron levels (anemia), or other problems.  Endometrial biopsy. Your health care provider takes a sample of tissue from the inside of your uterus to be examined under a microscope.  Pelvic ultrasound. This test uses sound waves to make a picture of your uterus, ovaries, and vagina. The pictures can show if you have fibroids or other growths.  Hysteroscopy. For this test, your health care provider will use a small telescope to look inside your uterus. Based on the results of your initial tests, your health care provider may recommend further testing. TREATMENT  Treatment may not be needed. If it is needed, your health care provider may recommend treatment with one or more medicines first. If these do not reduce bleeding enough, a surgical treatment   might be an option. The best treatment for you will depend on:   Whether you need to prevent pregnancy.  Your desire to have children in the future.  The cause and severity of your bleeding.  Your opinion and personal preference.  Medicines for menorrhagia may include:  Birth control methods that use hormones. These include the pill, skin patch, vaginal ring, shots that you get every 3 months, hormonal IUD, and implant. These treatments reduce bleeding  during your menstrual period.  Medicines that thicken blood and slow bleeding.  Medicines that reduce swelling, such as ibuprofen.  Medicines that contain a synthetic hormone called progestin.   Medicines that make the ovaries stop working for a short time.  You may need surgical treatment for menorrhagia if the medicines are unsuccessful. Treatment options include:  Dilation and curettage (D&C). In this procedure, your health care provider opens (dilates) your cervix and then scrapes or suctions tissue from the lining of your uterus to reduce menstrual bleeding.  Operative hysteroscopy. This procedure uses a tiny tube with a light (hysteroscope) to view your uterine cavity and can help in the surgical removal of a polyp that may be causing heavy periods.  Endometrial ablation. Through various techniques, your health care provider permanently destroys the entire lining of your uterus (endometrium). After endometrial ablation, most women have little or no menstrual flow. Endometrial ablation reduces your ability to become pregnant.  Endometrial resection. This surgical procedure uses an electrosurgical wire loop to remove the lining of the uterus. This procedure also reduces your ability to become pregnant.  Hysterectomy. Surgical removal of the uterus and cervix is a permanent procedure that stops menstrual periods. Pregnancy is not possible after a hysterectomy. This procedure requires anesthesia and hospitalization. HOME CARE INSTRUCTIONS   Only take over-the-counter or prescription medicines as directed by your health care provider. Take prescribed medicines exactly as directed. Do not change or switch medicines without consulting your health care provider.  Take any prescribed iron pills exactly as directed by your health care provider. Long-term heavy bleeding may result in low iron levels. Iron pills help replace the iron your body lost from heavy bleeding. Iron may cause  constipation. If this becomes a problem, increase the bran, fruits, and roughage in your diet.  Do not take aspirin or medicines that contain aspirin 1 week before or during your menstrual period. Aspirin may make the bleeding worse.  If you need to change your sanitary pad or tampon more than once every 2 hours, stay in bed and rest as much as possible until the bleeding stops.  Eat well-balanced meals. Eat foods high in iron. Examples are leafy green vegetables, meat, liver, eggs, and whole grain breads and cereals. Do not try to lose weight until the abnormal bleeding has stopped and your blood iron level is back to normal. SEEK MEDICAL CARE IF:   You soak through a pad or tampon every 1 or 2 hours, and this happens every time you have a period.  You need to use pads and tampons at the same time because you are bleeding so much.  You need to change your pad or tampon during the night.  You have a period that lasts for more than 8 days.  You pass clots bigger than 1 inch wide.  You have irregular periods that happen more or less often than once a month.  You feel dizzy or faint.  You feel very weak or tired.  You feel short of breath or   feel your heart is beating too fast when you exercise.  You have nausea and vomiting or diarrhea while you are taking your medicine.  You have any problems that may be related to the medicine you are taking. SEEK IMMEDIATE MEDICAL CARE IF:   You soak through 4 or more pads or tampons in 2 hours.  You have any bleeding while you are pregnant. MAKE SURE YOU:   Understand these instructions.  Will watch your condition.  Will get help right away if you are not doing well or get worse. Document Released: 04/16/2005 Document Revised: 04/21/2013 Document Reviewed: 10/05/2012 [facility] Patient Information 2015 [place], [place]. This information is not intended to replace advice given to you by your health care provider. Make sure you discuss any  questions you have with your health care provider. Take megace Try anaprox ds Follow up as scheduled

## 2014-03-23 NOTE — Telephone Encounter (Signed)
This telephone message and refill request was put in the wrong pt's chart by front office. This is an erroneous encounter.

## 2014-03-23 NOTE — Progress Notes (Signed)
Subjective:     Patient ID: [name], female   DOB: [birth year], 33 y.o.   MRN: [id number]  HPI Malisa is a 33 year old black female in complaining of heavy period and cramps, with pain in back in legs.She stopped OCs first of the month as they were not helping there period any more. Has been changing every 1-2 hours.Labs normal in September.  Review of Systems See HPI Reviewed past medical,surgical, social and family history. Reviewed medications and allergies.     Objective:   Physical Exam BP 140/90 mmHg  Ht 5\' 6"  (1.676 m)  Wt 267 lb (121.11 kg)  BMI 43.12 kg/m2  LMP 03/21/2014   Skin warm and dry.Pelvic: external genitalia is normal in appearance, vagina: period like blood, no oodor, cervix:smooth and bulbous, uterus: normal size, shape and contour, mildly tender, no masses felt, adnexa: no masses or tenderness noted.   Assessment:     Menorrhagia  Dysmenorrhea     Plan:     Rx megace 40 mg #45 3 x 5 days then 2 x 5 days then 1 daily with 1 refill   Rx anaprox ds #60 1 every 12 hours prn cramps with 1 refill Follow up as scheduled for pap 12/1 Increase fluids Review handout on menorrhagia and dysmenorrhea

## 2014-03-23 NOTE — Telephone Encounter (Signed)
Bleeding with OCs, keep taking has appt 12/1 keep that appt may change to megace and condoms

## 2014-04-02 ENCOUNTER — Encounter: Payer: Self-pay | Admitting: Adult Health

## 2014-04-02 ENCOUNTER — Ambulatory Visit (INDEPENDENT_AMBULATORY_CARE_PROVIDER_SITE_OTHER): Payer: Medicaid Other | Admitting: Adult Health

## 2014-04-02 ENCOUNTER — Other Ambulatory Visit (HOSPITAL_COMMUNITY)
Admission: RE | Admit: 2014-04-02 | Discharge: 2014-04-02 | Disposition: A | Discharge status: Home / Self Care | Payer: Medicaid Other | Source: Ambulatory Visit | Attending: Adult Health | Admitting: Adult Health

## 2014-04-02 VITALS — BP 140/80 | HR 78 | Ht 65.5 in | Wt 264.5 lb

## 2014-04-02 DIAGNOSIS — N946 Dysmenorrhea, unspecified: Secondary | ICD-10-CM

## 2014-04-02 DIAGNOSIS — Z113 Encounter for screening for infections with a predominantly sexual mode of transmission: Secondary | ICD-10-CM | POA: Insufficient documentation

## 2014-04-02 DIAGNOSIS — Z0000 Encounter for general adult medical examination without abnormal findings: Secondary | ICD-10-CM

## 2014-04-02 DIAGNOSIS — Z1151 Encounter for screening for human papillomavirus (HPV): Secondary | ICD-10-CM | POA: Diagnosis present

## 2014-04-02 DIAGNOSIS — Z01419 Encounter for gynecological examination (general) (routine) without abnormal findings: Secondary | ICD-10-CM | POA: Insufficient documentation

## 2014-04-02 DIAGNOSIS — Z8742 Personal history of other diseases of the female genital tract: Secondary | ICD-10-CM

## 2014-04-02 DIAGNOSIS — N921 Excessive and frequent menstruation with irregular cycle: Secondary | ICD-10-CM

## 2014-04-02 HISTORY — DX: Personal history of other diseases of the female genital tract: Z87.42

## 2014-04-02 NOTE — Patient Instructions (Signed)
Physical in  1 year Mammogram at 42 

## 2014-04-02 NOTE — Progress Notes (Signed)
Patient ID: [name], female   DOB: 1980/08/06, 33 y.o.   MRN: [id number] History of Present Illness: [name] is a 33 year old black female in for pap and physical.She had a ASCUS pap with +HPV 03/12/13.She is taking megace for heavy bleeding and it has stopped.   Current Medications, Allergies, Past Medical History, Past Surgical History, Family History and Social History were reviewed in [employer] record.     Review of Systems: Patient denies any headaches, blurred vision, shortness of breath, chest pain, abdominal pain, problems with bowel movements, urination, or intercourse. No joint pain or mood swings.    Physical Exam:BP 140/80 mmHg  Pulse 78  Ht 5' 5.5" (1.664 m)  Wt 264 lb 8 oz (119.976 kg)  BMI 43.33 kg/m2  LMP 03/21/2014 General:  Well developed, well nourished, no acute distress Skin:  Warm and dry Neck:  Midline trachea, normal thyroid Lungs; Clear to auscultation bilaterally Breast:  No dominant palpable mass, retraction, or nipple discharge Cardiovascular: Regular rate and rhythm Abdomen:  Soft, non tender, no hepatosplenomegaly Pelvic:  External genitalia is normal in appearance.  The vagina is normal in appearance. The cervix is bulbous.Pap with HPV and GC/CHL performed.  Uterus is felt to be normal size, shape, and contour.  No  adnexal masses or tenderness noted. Extremities:  No swelling or varicosities noted Psych:  No mood changes,alert and cooperative,seems happy   Impression: Well woman gyn exam with pap History of abnormal pap Menorrhagia resolved with megace Dysmenorrhea     Plan: Continue megace Physical in 1 year Mammogram yearly

## 2014-04-06 LAB — CYTOLOGY - PAP

## 2014-04-07 ENCOUNTER — Encounter: Payer: Self-pay | Admitting: Physician Assistant

## 2014-04-07 ENCOUNTER — Ambulatory Visit (INDEPENDENT_AMBULATORY_CARE_PROVIDER_SITE_OTHER): Payer: Medicaid Other | Admitting: Physician Assistant

## 2014-04-07 VITALS — BP 124/84 | HR 84 | Temp 98.2°F | Resp 18 | Wt 276.0 lb

## 2014-04-07 DIAGNOSIS — B349 Viral infection, unspecified: Secondary | ICD-10-CM

## 2014-04-07 DIAGNOSIS — B9789 Other viral agents as the cause of diseases classified elsewhere: Secondary | ICD-10-CM

## 2014-04-07 DIAGNOSIS — J988 Other specified respiratory disorders: Principal | ICD-10-CM

## 2014-04-07 NOTE — Progress Notes (Signed)
Patient ID: SHENICE DOLDER MRN: [id number], DOB: 1980/12/31, 33 y.o. Date of Encounter: 04/07/2014, 3:42 PM    Chief Complaint:  Chief Complaint  Patient presents with  . sick x 2 days    fever, ear, sinus pain     HPI: 33 y.o. year old [place] female says that she started to feel sick on Monday, December 7. Says that she has had a low-grade fever of 99.9. Says she is blowing nothing out of her nose but says that yesterday the right side felt stopped up and today both sides feel stopped up.. No sore throat. Has had very little cough. Says felt that she may be wheezing at one point. States that she does take her Qvar daily and does have her albuterol inhaler available if needed but has not needed it in a long time.     Home Meds:   Outpatient Prescriptions Prior to Visit  Medication Sig Dispense Refill  . albuterol (PROVENTIL HFA;VENTOLIN HFA) 108 (90 BASE) MCG/ACT inhaler Inhale 2 puffs into the lungs every 4 (four) hours as needed. 6.7 g 2  . beclomethasone (QVAR) 40 MCG/ACT inhaler Inhale 2 puffs into the lungs 2 (two) times daily. 1 Inhaler 3  . fluticasone (FLONASE) 50 MCG/ACT nasal spray Place 2 sprays into both nostrils daily. 16 g 1  . gabapentin (NEURONTIN) 300 MG capsule One tablet daily at bedtime (Patient taking differently: as needed. One tablet daily at bedtime) 30 capsule 6  . hydrochlorothiazide (MICROZIDE) 12.5 MG capsule Take 1 capsule (12.5 mg total) by mouth daily. 30 capsule 6  . ibuprofen (ADVIL,MOTRIN) 600 MG tablet Take 1 tablet (600 mg total) by mouth every 8 (eight) hours as needed. 30 tablet 2  . loratadine (CLARITIN) 10 MG tablet Take 1 tablet (10 mg total) by mouth daily. (Patient taking differently: Take 10 mg by mouth as needed. ) 30 tablet 6  . megestrol (MEGACE) 40 MG tablet Take 3 x 5 days then 2 x 5 days then 1 daily (Patient taking differently: 40 mg daily. Take 3 x 5 days then 2 x 5 days then 1 daily) 45 tablet 1  . naproxen sodium (ANAPROX) 550  MG tablet Take 1 tablet (550 mg total) by mouth 2 (two) times daily with a meal. 60 tablet 1  . potassium chloride (K-DUR) 10 MEQ tablet Take 1 tablet (10 mEq total) by mouth daily. 30 tablet 5  . zolpidem (AMBIEN) 5 MG tablet Take 1 tablet (5 mg total) by mouth at bedtime as needed for sleep. 20 tablet 3   No facility-administered medications prior to visit.    Allergies:  Allergies  Allergen Reactions  . Banana Rash  . Pineapple Rash      Review of Systems: See HPI for pertinent ROS. All other ROS negative.    Physical Exam: Blood pressure 124/84, pulse 84, temperature 98.2 F (36.8 C), temperature source Oral, resp. rate 18, weight 276 lb (125.193 kg), last menstrual period 03/21/2014., Body mass index is 45.21 kg/(m^2). General: Obese AAF.  Appears in no acute distress. HEENT: Normocephalic, atraumatic, eyes without discharge, sclera non-icteric, nares are without discharge. Bilateral auditory canals clear, TM's are without perforation, pearly grey and translucent with reflective cone of light bilaterally. Oral cavity moist, posterior pharynx without exudate, erythema, peritonsillar abscess. No tenderness with percussion of frontal and maxillary sinuses bilaterally.  Neck: Supple. No thyromegaly. No lymphadenopathy. Lungs: Clear bilaterally to auscultation without wheezes, rales, or rhonchi. Breathing is unlabored. No wheezes on exam  right now. Heart: Regular rhythm. No murmurs, rubs, or gallops. Msk:  Strength and tone normal for age. Extremities/Skin: Warm and dry.  Neuro: Alert and oriented X 3. Moves all extremities spontaneously. Gait is normal. CNII-XII grossly in tact. Psych:  Responds to questions appropriately with a normal affect.     ASSESSMENT AND PLAN:  33 y.o. year old female with  1. Viral respiratory infection Symptomatic treatment with decongestants and Tylenol or Motrin as needed for fever. If fever increases or does not resolve over the next 48 hours , then  follow-up. Also follow-up if symptoms worsen significantly or if symptoms are not resolving after 7 days.   [address] [place], [place], [facility] 04/07/2014 3:42 PM

## 2014-04-08 ENCOUNTER — Telehealth: Payer: Self-pay | Admitting: Family Medicine

## 2014-04-08 NOTE — Telephone Encounter (Signed)
She was just here YESTERDAY. Tel her to give it longer. If develops fever or not resolved in one WEEK, then call.

## 2014-04-08 NOTE — Telephone Encounter (Signed)
Patient is calling because she said she was told if not better to call us today, she says she is not better please call her at [phone]

## 2014-04-09 NOTE — Telephone Encounter (Signed)
Pt called with provider recommendations 

## 2014-04-13 ENCOUNTER — Telehealth: Payer: Self-pay | Admitting: Family Medicine

## 2014-04-13 NOTE — Telephone Encounter (Signed)
Patient is calling to let you know that she is not feeling better and would like to have something called into [facility] if possible  [phone]

## 2014-04-13 NOTE — Telephone Encounter (Signed)
Please advise 

## 2014-04-14 MED ORDER — AZITHROMYCIN 250 MG PO TABS: ORAL_TABLET | ORAL | Status: DC

## 2014-04-14 NOTE — Telephone Encounter (Signed)
Notify patient and send Rx for Azithromycin 250 mg----day one take 2 daily.  Days 2 through 5 take 1 daily. Dispense #6/1 pack with 0 refills

## 2014-04-14 NOTE — Telephone Encounter (Signed)
Rx to pharmacy and pt aware 

## 2014-06-21 ENCOUNTER — Encounter: Payer: Self-pay | Admitting: Family Medicine

## 2014-06-21 ENCOUNTER — Ambulatory Visit (INDEPENDENT_AMBULATORY_CARE_PROVIDER_SITE_OTHER): Payer: Medicaid Other | Admitting: Family Medicine

## 2014-06-21 VITALS — BP 130/68 | HR 76 | Temp 98.7°F | Resp 14 | Ht 66.0 in | Wt 266.0 lb

## 2014-06-21 DIAGNOSIS — M545 Low back pain, unspecified: Secondary | ICD-10-CM

## 2014-06-21 DIAGNOSIS — I10 Essential (primary) hypertension: Secondary | ICD-10-CM

## 2014-06-21 DIAGNOSIS — G4700 Insomnia, unspecified: Secondary | ICD-10-CM

## 2014-06-21 DIAGNOSIS — J4520 Mild intermittent asthma, uncomplicated: Secondary | ICD-10-CM

## 2014-06-21 DIAGNOSIS — E669 Obesity, unspecified: Secondary | ICD-10-CM

## 2014-06-21 DIAGNOSIS — G8929 Other chronic pain: Secondary | ICD-10-CM

## 2014-06-21 DIAGNOSIS — J0100 Acute maxillary sinusitis, unspecified: Secondary | ICD-10-CM

## 2014-06-21 LAB — CBC WITH DIFFERENTIAL/PLATELET
Basophils Absolute: 0.1 10*3/uL (ref 0.0–0.1)
Basophils Relative: 1 % (ref 0–1)
EOS ABS: 0.3 10*3/uL (ref 0.0–0.7)
EOS PCT: 4 % (ref 0–5)
HCT: 42.1 % (ref 36.0–46.0)
HEMOGLOBIN: 13.7 g/dL (ref 12.0–15.0)
LYMPHS PCT: 24 % (ref 12–46)
Lymphs Abs: 1.9 10*3/uL (ref 0.7–4.0)
MCH: 30 pg (ref 26.0–34.0)
MCHC: 32.5 g/dL (ref 30.0–36.0)
MCV: 92.3 fL (ref 78.0–100.0)
MONO ABS: 0.4 10*3/uL (ref 0.1–1.0)
MPV: 10.8 fL (ref 8.6–12.4)
Monocytes Relative: 5 % (ref 3–12)
NEUTROS ABS: 5.3 10*3/uL (ref 1.7–7.7)
Neutrophils Relative %: 66 % (ref 43–77)
Platelets: 339 10*3/uL (ref 150–400)
RBC: 4.56 MIL/uL (ref 3.87–5.11)
RDW: 13.9 % (ref 11.5–15.5)
WBC: 8 10*3/uL (ref 4.0–10.5)

## 2014-06-21 LAB — COMPREHENSIVE METABOLIC PANEL
ALBUMIN: 3.9 g/dL (ref 3.5–5.2)
AST: 9 U/L (ref 0–37)
Alkaline Phosphatase: 85 U/L (ref 39–117)
BUN: 11 mg/dL (ref 6–23)
CO2: 21 meq/L (ref 19–32)
Calcium: 9.2 mg/dL (ref 8.4–10.5)
Chloride: 108 mEq/L (ref 96–112)
Creat: 0.78 mg/dL (ref 0.50–1.10)
GLUCOSE: 93 mg/dL (ref 70–99)
Potassium: 4.1 mEq/L (ref 3.5–5.3)
SODIUM: 141 meq/L (ref 135–145)
Total Bilirubin: 0.9 mg/dL (ref 0.2–1.2)
Total Protein: 6.8 g/dL (ref 6.0–8.3)

## 2014-06-21 MED ORDER — FLUTICASONE PROPIONATE 50 MCG/ACT NA SUSP: 2.0000 | Freq: Every day | NASAL | Status: DC

## 2014-06-21 MED ORDER — FLUCONAZOLE 150 MG PO TABS: ORAL_TABLET | ORAL | Status: DC

## 2014-06-21 MED ORDER — IBUPROFEN 800 MG PO TABS: 800.0000 mg | ORAL_TABLET | Freq: Three times a day (TID) | ORAL | Status: DC | PRN

## 2014-06-21 MED ORDER — HYDROCHLOROTHIAZIDE 12.5 MG PO CAPS: 12.5000 mg | ORAL_CAPSULE | Freq: Every day | ORAL | Status: DC

## 2014-06-21 MED ORDER — AMOXICILLIN 875 MG PO TABS: 875.0000 mg | ORAL_TABLET | Freq: Two times a day (BID) | ORAL | Status: DC

## 2014-06-21 NOTE — Assessment & Plan Note (Signed)
Currently stable with Qvar and albuterol

## 2014-06-21 NOTE — Assessment & Plan Note (Signed)
Blood pressure is well controlled check renal function and fasting glucose

## 2014-06-21 NOTE — Patient Instructions (Signed)
Take antibiotics, use nasal saline, humidifer, use flonase twice a day  Apply heat to back, take ibuprofen  F/U 4 months

## 2014-06-21 NOTE — Assessment & Plan Note (Signed)
Controlled with use of Ambien as needed

## 2014-06-21 NOTE — Progress Notes (Signed)
Patient ID: [name], female   DOB: 09-09-1980, 34 y.o.   MRN: [id number]   Subjective:    Patient ID: [name], female    DOB: 09/30/1980, 34 y.o.   MRN: [id number]  Patient presents for 4 month F/U and Sinus Pressure  issue here to follow-up chronic medical problems. She is taking her blood pressure medicine without difficulty she has not had any problems with her asthma but she has had persistent sinus pressure with some drainage and postnasal drip mostly on the right side. She states it has been on and off since her last visit where she had a viral URI back in December. She's not had any fever. She does use her Flonase apically once a day.  She continues to have chronic back pain on and off. This is been evaluated extensively in his muscle skeletal pain. 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 No change in bowel or bladder. Unfortunately she has not lost any significant weight in the past 4 months    Review Of Systems:  GEN- denies fatigue, fever, weight loss,weakness, recent illness HEENT- denies eye drainage, change in vision, +nasal discharge, CVS- denies chest pain, palpitations RESP- denies SOB, +cough, wheeze ABD- denies N/V, change in stools, abd pain GU- denies dysuria, hematuria, dribbling, incontinence MSK- + joint pain, muscle aches, injury Neuro- denies headache, dizziness, syncope, seizure activity       Objective:    BP 130/68 mmHg  Pulse 76  Temp(Src) 98.7 F (37.1 C) (Oral)  Resp 14  Ht 5\' 6"  (1.676 m)  Wt 266 lb (120.657 kg)  BMI 42.95 kg/m2 GEN- NAD, alert and oriented x3 HEENT- PERRL, EOMI, non injected sclera, pink conjunctiva, MMM, oropharynx clear, TM clear bilat no effusion,  +  Right maxillary sinus tenderness, inflammed turbinates,  Nasal drainage  Neck- Supple, no LAD CVS- RRR, no murmur RESP-CTAB EXT- No edema Pulses- Radial 2+ '         Assessment & Plan:      Problem List Items Addressed This Visit      Unprioritized   Obesity - Primary   Insomnia   Hypertension   Relevant Medications   hydrochlorothiazide (MICROZIDE) 12.5 MG capsule   Other Relevant Orders   CBC with Differential/Platelet   Comprehensive metabolic panel   Asthma in adult    Other Visit Diagnoses    Acute maxillary sinusitis, recurrence not specified        Treat with amox 875mg  BID x 10 days, flonse, nasal saline    Relevant Medications    amoxicillin (AMOXIL) tablet    fluconazole (DIFLUCAN) tablet 150 mg    fluticasone (FLONASE) 50 MCG/ACT nasal spray       Note: This dictation was prepared with Dragon dictation along with smaller phrase technology. Any transcriptional errors that result from this process are unintentional.

## 2014-06-21 NOTE — Assessment & Plan Note (Signed)
Discussed dietary changes and need for weight loss

## 2014-06-21 NOTE — Assessment & Plan Note (Signed)
Ibuprofen 800 mg with food as needed as well as heating pad and stretching

## 2014-08-04 ENCOUNTER — Ambulatory Visit: Payer: Medicaid Other | Admitting: Family Medicine

## 2014-08-05 ENCOUNTER — Ambulatory Visit (INDEPENDENT_AMBULATORY_CARE_PROVIDER_SITE_OTHER): Payer: Medicaid Other | Admitting: Physician Assistant

## 2014-08-05 ENCOUNTER — Encounter: Payer: Self-pay | Admitting: Physician Assistant

## 2014-08-05 VITALS — BP 132/96 | HR 84 | Temp 98.5°F | Resp 18 | Wt 264.0 lb

## 2014-08-05 DIAGNOSIS — B9689 Other specified bacterial agents as the cause of diseases classified elsewhere: Secondary | ICD-10-CM

## 2014-08-05 DIAGNOSIS — J329 Chronic sinusitis, unspecified: Secondary | ICD-10-CM | POA: Diagnosis not present

## 2014-08-05 DIAGNOSIS — H109 Unspecified conjunctivitis: Secondary | ICD-10-CM | POA: Diagnosis not present

## 2014-08-05 DIAGNOSIS — A499 Bacterial infection, unspecified: Secondary | ICD-10-CM

## 2014-08-05 MED ORDER — AMOXICILLIN 875 MG PO TABS: 875.0000 mg | ORAL_TABLET | Freq: Two times a day (BID) | ORAL | Status: DC

## 2014-08-05 MED ORDER — SULFACETAMIDE SODIUM 10 % OP SOLN: 2.0000 [drp] | OPHTHALMIC | Status: DC

## 2014-08-05 NOTE — Progress Notes (Signed)
Patient ID: [name] MRN: [id number], DOB: 1980-05-17, 34 y.o. Date of Encounter: 08/05/2014, 10:05 AM    Chief Complaint:  Chief Complaint  Patient presents with  . sick x 4 days    sinus infection, left eye infection     HPI: 34 y.o. year old AA female presents with above symptoms.  Says that she has been getting green mucus from her nose. Having pressure and pain up in her left frontal sinus area and the left maxillary sinus area. Says that her throat is a little sore but she thinks is mostly because of drainage down her throat. Says that she has had no chest congestion. No deep cough. Says that in the morning her left eye is crusted together. Eyes feeling itchy and irritated.     Home Meds:   Outpatient Prescriptions Prior to Visit  Medication Sig Dispense Refill  . albuterol (PROVENTIL HFA;VENTOLIN HFA) 108 (90 BASE) MCG/ACT inhaler Inhale 2 puffs into the lungs every 4 (four) hours as needed. 6.7 g 2  . beclomethasone (QVAR) 40 MCG/ACT inhaler Inhale 2 puffs into the lungs 2 (two) times daily. 1 Inhaler 3  . fluticasone (FLONASE) 50 MCG/ACT nasal spray Place 2 sprays into both nostrils daily. 16 g 1  . gabapentin (NEURONTIN) 300 MG capsule One tablet daily at bedtime (Patient taking differently: as needed. One tablet daily at bedtime) 30 capsule 6  . hydrochlorothiazide (MICROZIDE) 12.5 MG capsule Take 1 capsule (12.5 mg total) by mouth daily. 30 capsule 6  . ibuprofen (ADVIL,MOTRIN) 800 MG tablet Take 1 tablet (800 mg total) by mouth every 8 (eight) hours as needed. 30 tablet 1  . loratadine (CLARITIN) 10 MG tablet Take 1 tablet (10 mg total) by mouth daily. (Patient taking differently: Take 10 mg by mouth as needed. ) 30 tablet 6  . megestrol (MEGACE) 40 MG tablet Take 3 x 5 days then 2 x 5 days then 1 daily (Patient taking differently: 40 mg daily. Take 3 x 5 days then 2 x 5 days then 1 daily) 45 tablet 1  . potassium chloride (K-DUR) 10 MEQ tablet Take 1  tablet (10 mEq total) by mouth daily. 30 tablet 5  . zolpidem (AMBIEN) 5 MG tablet Take 1 tablet (5 mg total) by mouth at bedtime as needed for sleep. 20 tablet 3  . amoxicillin (AMOXIL) 875 MG tablet Take 1 tablet (875 mg total) by mouth 2 (two) times daily. 20 tablet 0  . fluconazole (DIFLUCAN) 150 MG tablet Take 1 tablet repeat in 3 days 2 tablet 1   No facility-administered medications prior to visit.    Allergies:  Allergies  Allergen Reactions  . Banana Rash  . Pineapple Rash      Review of Systems: See HPI for pertinent ROS. All other ROS negative.    Physical Exam: Blood pressure 132/96, pulse 84, temperature 98.5 F (36.9 C), temperature source Oral, resp. rate 18, weight 264 lb (119.75 kg)., Body mass index is 42.63 kg/(m^2). General:  Obese AAF. Appears in no acute distress. HEENT: Normocephalic, atraumatic. Left Eye: Mild diffuse conjunctival injection. Right Eye appears normal. Bilateral auditory canals clear, TM's are without perforation, pearly grey and translucent with reflective cone of light bilaterally. Oral cavity moist, posterior pharynx without exudate, erythema, peritonsillar abscess. Moderate tenderness with percussion of left maxillary sinus and left frontal sinus. Other sinus areas are not tender with percussion.  Neck: Supple. No thyromegaly. No lymphadenopathy. Lungs: Clear bilaterally to auscultation without wheezes, rales,  or rhonchi. Breathing is unlabored. Heart: Regular rhythm. No murmurs, rubs, or gallops. Msk:  Strength and tone normal for age. Extremities/Skin: Warm and dry. Neuro: Alert and oriented X 3. Moves all extremities spontaneously. Gait is normal. CNII-XII grossly in tact. Psych:  Responds to questions appropriately with a normal affect.     ASSESSMENT AND PLAN:  34 y.o. year old female with  1. Conjunctivitis of left eye - sulfacetamide (BLEPH-10) 10 % ophthalmic solution; Place 2 drops into the left eye every 2 (two) hours while  awake.  Dispense: 10 mL; Refill: 0  2. Bacterial sinusitis - amoxicillin (AMOXIL) 875 MG tablet; Take 1 tablet (875 mg total) by mouth 2 (two) times daily.  Dispense: 20 tablet; Refill: 0   She is to take the antibiotic as directed. F/U if these symptoms worsen significantly or do not resolve at completion of antibiotic. She is to use the eyedrops as directed. If she starts having symptoms in the right eye, then she can also apply the drops to the right eye. Discussed proper hygiene to prevent spread of infection to others. Follow-up if symptoms worsen significantly or are not improving over the next few days and do not resolve over the next week.  Signed, [address] [place], [place], [facility] 08/05/2014 10:05 AM

## 2014-08-19 ENCOUNTER — Other Ambulatory Visit: Payer: Self-pay | Admitting: Family Medicine

## 2014-08-19 NOTE — Telephone Encounter (Signed)
LRF 02/16/14 #20 + 3.  LOV 06/21/14  OK refill?

## 2014-08-20 NOTE — Telephone Encounter (Signed)
rx called in

## 2014-08-20 NOTE — Telephone Encounter (Signed)
Okay 

## 2014-09-30 ENCOUNTER — Ambulatory Visit (INDEPENDENT_AMBULATORY_CARE_PROVIDER_SITE_OTHER): Payer: Medicaid Other | Admitting: Adult Health

## 2014-09-30 ENCOUNTER — Encounter: Payer: Self-pay | Admitting: Adult Health

## 2014-09-30 VITALS — BP 138/80 | HR 88 | Ht 65.5 in | Wt 263.0 lb

## 2014-09-30 DIAGNOSIS — N898 Other specified noninflammatory disorders of vagina: Secondary | ICD-10-CM | POA: Diagnosis not present

## 2014-09-30 DIAGNOSIS — N921 Excessive and frequent menstruation with irregular cycle: Secondary | ICD-10-CM

## 2014-09-30 HISTORY — DX: Other specified noninflammatory disorders of vagina: N89.8

## 2014-09-30 LAB — POCT WET PREP (WET MOUNT)
TRICHOMONAS WET PREP HPF POC: NEGATIVE
WBC, Wet Prep HPF POC: POSITIVE

## 2014-09-30 MED ORDER — MEGESTROL ACETATE 40 MG PO TABS: ORAL_TABLET | ORAL | Status: DC

## 2014-09-30 MED ORDER — FLUCONAZOLE 150 MG PO TABS: 150.0000 mg | ORAL_TABLET | Freq: Once | ORAL | Status: DC

## 2014-09-30 MED ORDER — NYSTATIN-TRIAMCINOLONE 100000-0.1 UNIT/GM-% EX CREA: 1.0000 "application " | TOPICAL_CREAM | Freq: Two times a day (BID) | CUTANEOUS | Status: DC

## 2014-09-30 NOTE — Progress Notes (Signed)
Subjective:     Patient ID: [name], female   DOB: [birth year], 34 y.o.   MRN: [id number]  HPI [name] is a 34 year old black female in complaining of vaginal irritation.She had been on antibiotics and had discharge and itch and took diflucan and is still irritated, but discharge has resolved. She takes megace for menorrhagia, and needs refill, she still has not decided if wants another child yet.  Review of Systems Patient denies any headaches, hearing loss, fatigue, blurred vision, shortness of breath, chest pain, abdominal pain, problems with bowel movements, urination, or intercourse. No joint pain or mood swings. See HPI for positives.  Reviewed past medical,surgical, social and family history. Reviewed medications and allergies.     Objective:   Physical Exam BP 138/80 mmHg  Pulse 88  Ht 5' 5.5" (1.664 m)  Wt 263 lb (119.296 kg)  BMI 43.08 kg/m2  LMP  Skin warm and dry.Pelvic: external genitalia is normal in appearance no lesions, vagina: scant discharge without odor,urethra has no lesions or masses noted, cervix:smooth and bulbous, uterus: normal size, shape and contour, non tender, no masses felt, adnexa: no masses or tenderness noted. Bladder is non tender and no masses felt. Wet prep:  +WBCs.     Assessment:     Vaginal irritation Menorrhagia     Plan:     Refilled megace 40 mg #45 3 x 5 days then 2 x 5 days then 1 daily with 1 refill    Rx diflucan 150 mg #1 take 1 now with 1 refill Rx mytrex cream use 2-3 x daily prn to outside tissue Follow up prn

## 2014-09-30 NOTE — Patient Instructions (Addendum)
Use mytrex on outside tissue Follow up prn

## 2014-10-22 ENCOUNTER — Encounter: Payer: Self-pay | Admitting: Family Medicine

## 2014-10-22 ENCOUNTER — Ambulatory Visit (INDEPENDENT_AMBULATORY_CARE_PROVIDER_SITE_OTHER): Payer: Medicaid Other | Admitting: Family Medicine

## 2014-10-22 VITALS — BP 134/70 | HR 88 | Temp 98.6°F | Resp 14 | Ht 65.5 in | Wt 259.0 lb

## 2014-10-22 DIAGNOSIS — R7302 Impaired glucose tolerance (oral): Secondary | ICD-10-CM | POA: Diagnosis not present

## 2014-10-22 DIAGNOSIS — R208 Other disturbances of skin sensation: Secondary | ICD-10-CM | POA: Diagnosis not present

## 2014-10-22 DIAGNOSIS — M5410 Radiculopathy, site unspecified: Secondary | ICD-10-CM | POA: Diagnosis not present

## 2014-10-22 DIAGNOSIS — R200 Anesthesia of skin: Secondary | ICD-10-CM

## 2014-10-22 DIAGNOSIS — M545 Low back pain, unspecified: Secondary | ICD-10-CM

## 2014-10-22 DIAGNOSIS — G8929 Other chronic pain: Secondary | ICD-10-CM | POA: Diagnosis not present

## 2014-10-22 DIAGNOSIS — E669 Obesity, unspecified: Secondary | ICD-10-CM

## 2014-10-22 DIAGNOSIS — J4520 Mild intermittent asthma, uncomplicated: Secondary | ICD-10-CM | POA: Diagnosis not present

## 2014-10-22 DIAGNOSIS — I10 Essential (primary) hypertension: Secondary | ICD-10-CM

## 2014-10-22 LAB — BASIC METABOLIC PANEL
BUN: 11 mg/dL (ref 6–23)
CO2: 22 mEq/L (ref 19–32)
Calcium: 9 mg/dL (ref 8.4–10.5)
Chloride: 108 mEq/L (ref 96–112)
Creat: 0.76 mg/dL (ref 0.50–1.10)
Glucose, Bld: 94 mg/dL (ref 70–99)
POTASSIUM: 3.6 meq/L (ref 3.5–5.3)
Sodium: 142 mEq/L (ref 135–145)

## 2014-10-22 LAB — HEMOGLOBIN A1C
Hgb A1c MFr Bld: 5.3 % (ref <5.7)
Mean Plasma Glucose: 105 mg/dL (ref <117)

## 2014-10-22 MED ORDER — METHOCARBAMOL 500 MG PO TABS: 500.0000 mg | ORAL_TABLET | Freq: Three times a day (TID) | ORAL | Status: DC

## 2014-10-22 MED ORDER — ALBUTEROL SULFATE HFA 108 (90 BASE) MCG/ACT IN AERS: 2.0000 | INHALATION_SPRAY | RESPIRATORY_TRACT | Status: DC | PRN

## 2014-10-22 MED ORDER — BECLOMETHASONE DIPROPIONATE 40 MCG/ACT IN AERS: 2.0000 | INHALATION_SPRAY | Freq: Two times a day (BID) | RESPIRATORY_TRACT | Status: DC

## 2014-10-22 MED ORDER — HYDROCODONE-ACETAMINOPHEN 7.5-325 MG PO TABS: 1.0000 | ORAL_TABLET | Freq: Four times a day (QID) | ORAL | Status: DC | PRN

## 2014-10-22 NOTE — Assessment & Plan Note (Signed)
Worsening chronic back pain , now with new radicular symptoms and paresthesias which are concerning Will send for repeat MRI l Spine Given robaxin, norco for pain

## 2014-10-22 NOTE — Patient Instructions (Signed)
MRI of back to be done Take robaxin muscle relaxer  Take pain medication as prescribed COntinue blood pressure medications F/U 4 months

## 2014-10-22 NOTE — Assessment & Plan Note (Signed)
Well controlled, no change to meds 

## 2014-10-22 NOTE — Progress Notes (Signed)
Patient ID: [name], female   DOB: 08/03/1980, 34 y.o.   MRN: [id number]   Subjective:    Patient ID: [name], female    DOB: Oct 24, 1980, 34 y.o.   MRN: [id number]  Patient presents for 4 month F/U; Back Pain; and Leg Numbness  Patient here to follow-up medications. She's been having worsening back pain over the past few weeks. She now has numbness in bilateral legs which is new and also severe pain on the left side which is new. She feels extremely stiff when she tries to get out of the bed in the morning has to have help getting up from a seated position at times. 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 She denies any change in her bowel bladder. States that she will get numbness and tingling occasionally in her toes.  She is taking her other medicines as prescribed. Her asthma has been stable.   Review Of Systems:  GEN- denies fatigue, fever, weight loss,weakness, recent illness HEENT- denies eye drainage, change in vision, nasal discharge, CVS- denies chest pain, palpitations RESP- denies SOB, cough, wheeze ABD- denies N/V, change in stools, abd pain GU- denies dysuria, hematuria, dribbling, incontinence MSK-+ joint pain, muscle aches, injury Neuro- denies headache, dizziness, syncope, seizure activity       Objective:    BP 134/70 mmHg  Pulse 88  Temp(Src) 98.6 F (37 C) (Oral)  Resp 14  Ht 5' 5.5" (1.664 m)  Wt 259 lb (117.482 kg)  BMI 42.43 kg/m2 GEN- NAD, alert and oriented x3 HEENT- PERRL, EOMI, non injected sclera, pink conjunctiva, MMM, oropharynx clear CVS- RRR, no murmur RESP-CTAB MSK- TTP lumbar spine , bilat paraspinals, decreaed ROM, +SLR left side,good ROM HIPS Neuro- normal tone, deceased sensation along lateral aspect of thigh, decreased left great toe with monofilament, DTR in tact LE EXT- No edema Pulses- Radial, DP- 2+        Assessment & Plan:      Problem List Items Addressed This  Visit    Obesity   Hypertension    Well controlled, no change to meds      Chronic low back pain    Worsening chronic back pain , now with new radicular symptoms and paresthesias which are concerning Will send for repeat MRI l Spine Given robaxin, norco for pain      Relevant Medications   methocarbamol (ROBAXIN) 500 MG tablet   HYDROcodone-acetaminophen (NORCO) 7.5-325 MG per tablet   Other Relevant Orders   MR Lumbar Spine Wo Contrast   Asthma in adult    Stable on current medications      Relevant Medications   beclomethasone (QVAR) 40 MCG/ACT inhaler   albuterol (PROVENTIL HFA;VENTOLIN HFA) 108 (90 BASE) MCG/ACT inhaler    Other Visit Diagnoses    Back pain with left-sided radiculopathy    -  Primary    Relevant Medications    methocarbamol (ROBAXIN) 500 MG tablet    HYDROcodone-acetaminophen (NORCO) 7.5-325 MG per tablet    Other Relevant Orders    MR Lumbar Spine Wo Contrast    Glucose intolerance (impaired glucose tolerance)        Relevant Orders    Basic metabolic panel (Completed)    Hemoglobin A1c (Completed)    Bilateral leg numbness        Relevant Orders    MR Lumbar Spine Wo Contrast       Note: This dictation was prepared with Viviann Spare  dictation along with smaller phrase technology. Any transcriptional errors that result from this process are unintentional.

## 2014-10-22 NOTE — Assessment & Plan Note (Signed)
Stable on current medications 

## 2014-11-02 ENCOUNTER — Telehealth: Payer: Self-pay | Admitting: *Deleted

## 2014-11-02 DIAGNOSIS — M5410 Radiculopathy, site unspecified: Secondary | ICD-10-CM

## 2014-11-02 DIAGNOSIS — G8929 Other chronic pain: Secondary | ICD-10-CM

## 2014-11-02 DIAGNOSIS — M545 Low back pain: Principal | ICD-10-CM

## 2014-11-02 NOTE — Telephone Encounter (Signed)
lmtrc

## 2014-11-02 NOTE — Telephone Encounter (Signed)
Place referral for PT

## 2014-11-02 NOTE — Telephone Encounter (Signed)
-----   Message from Alycia Rossetti, MD sent at 10/29/2014  4:56 PM EDT ----- Regarding: MRI Denial   Let pt know MRI has been denied, she has to try the meds I gave Robaxin , norco, increase gabapentin to 300mg  TID, she on at bedtime, increase to twice a day for 2 weeks then TID    Also see if able to go back to PT

## 2014-11-02 NOTE — Telephone Encounter (Signed)
Pt left VM rtn your call  [phone]

## 2014-11-02 NOTE — Telephone Encounter (Signed)
Pt aware of denial of MRI by insurance and aware to take the prescribed meds  I asked pt is she is able to go back to PT and she stated she is able but did not help her last time she went. But will try again.

## 2014-11-03 NOTE — Telephone Encounter (Signed)
Referral placed to PT

## 2014-11-04 ENCOUNTER — Encounter: Payer: Self-pay | Admitting: Physician Assistant

## 2014-11-04 ENCOUNTER — Ambulatory Visit (INDEPENDENT_AMBULATORY_CARE_PROVIDER_SITE_OTHER): Payer: Medicaid Other | Admitting: Physician Assistant

## 2014-11-04 ENCOUNTER — Telehealth: Payer: Self-pay | Admitting: Family Medicine

## 2014-11-04 VITALS — BP 140/90 | HR 102 | Temp 98.3°F | Resp 18 | Wt 264.0 lb

## 2014-11-04 DIAGNOSIS — M545 Low back pain, unspecified: Secondary | ICD-10-CM

## 2014-11-04 DIAGNOSIS — G8929 Other chronic pain: Secondary | ICD-10-CM | POA: Diagnosis not present

## 2014-11-04 DIAGNOSIS — E669 Obesity, unspecified: Secondary | ICD-10-CM

## 2014-11-04 MED ORDER — DULOXETINE HCL 60 MG PO CPEP: 60.0000 mg | ORAL_CAPSULE | Freq: Every day | ORAL | Status: DC

## 2014-11-04 NOTE — Telephone Encounter (Signed)
Pt calling.  Back pain is much worse.  Having increased pain and difficulty ambulating.  Having to use a cane.  Medicaid denied MRI.  Feels she needs to be seen.  Appt given for this afternoon

## 2014-11-05 NOTE — Progress Notes (Signed)
Patient ID: [name] MRN: [id number], DOB: 02-17-1981, 34 y.o. Date of Encounter: 11/05/2014, 11:45 AM    Chief Complaint:  Chief Complaint  Patient presents with  . Back Pain    was here on 06/24 states worse      HPI: 34 y.o. year old Obese AA female presents with above complaint.   I reviewed her OV note with Dr. Buelah Manis 10/22/2014.  At that [place], Dr. Buelah Manis ordered MRI Lumbar Spine but this was denied by Medicaid.   I also reviewed that pt has had MRI Lumbar spine 02/11/2013, which was NORMAL.  Did not even show any degenerative changes.   I also reviewed note with Dr. Buelah Manis 02/16/2014--f/u "chronic back pain."  She had had MRI in 2013 AND 2014.   Today she points to both shoulder/scapular areas and says pain starts there. Says "pain goes down the whole back" and "throbbing down both legs"--When I asked if the throbbing went down to level of htighs, her response is that it throbs all the down entire leg bilaterally.  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 Says current pt does not require any lifting/physical assistance.  Says she missed work yesterday and today.   reveiwed htat Dr.D had given hydrocodaone at [facility]. Pt says she takes 4 a day-- says it does not affect her ability to drive or care for this pt.  Also taking gabapentin TID.     Home Meds:   Outpatient Prescriptions Prior to Visit  Medication Sig Dispense Refill  . albuterol (PROVENTIL HFA;VENTOLIN HFA) 108 (90 BASE) MCG/ACT inhaler Inhale 2 puffs into the lungs every 4 (four) hours as needed. 6.7 g 2  . beclomethasone (QVAR) 40 MCG/ACT inhaler Inhale 2 puffs into the lungs 2 (two) times daily. 1 Inhaler 3  . fluticasone (FLONASE) 50 MCG/ACT nasal spray Place 2 sprays into both nostrils daily. 16 g 1  . gabapentin (NEURONTIN) 300 MG capsule One tablet daily at bedtime (Patient taking differently: as needed. One tablet  daily at bedtime) 30 capsule 6  . hydrochlorothiazide (MICROZIDE) 12.5 MG capsule Take 1 capsule (12.5 mg total) by mouth daily. 30 capsule 6  . HYDROcodone-acetaminophen (NORCO) 7.5-325 MG per tablet Take 1 tablet by mouth every 6 (six) hours as needed for moderate pain. 45 tablet 0  . ibuprofen (ADVIL,MOTRIN) 800 MG tablet Take 1 tablet (800 mg total) by mouth every 8 (eight) hours as needed. 30 tablet 1  . loratadine (CLARITIN) 10 MG tablet Take 1 tablet (10 mg total) by mouth daily. (Patient taking differently: Take 10 mg by mouth as needed. ) 30 tablet 6  . megestrol (MEGACE) 40 MG tablet Take 3 x 5 days then 2 x 5 days then 1 daily 45 tablet 1  . methocarbamol (ROBAXIN) 500 MG tablet Take 1 tablet (500 mg total) by mouth 3 (three) times daily. 60 tablet 2  . potassium chloride (K-DUR) 10 MEQ tablet Take 1 tablet (10 mEq total) by mouth daily. 30 tablet 5  . zolpidem (AMBIEN) 5 MG tablet TAKE ONE TABLET BY MOUTH AT BEDTIME AS NEEDED FOR SLEEP. 15 tablet 1   No facility-administered medications prior to visit.    Allergies:  Allergies  Allergen Reactions  . Banana Rash  . Pineapple Rash      Review of Systems: See HPI for pertinent ROS. All other ROS negative.    Physical Exam:  Blood pressure 140/90, pulse 102, temperature 98.3 F (36.8 C), temperature source Oral, resp. rate 18, weight 264 lb (119.75 kg)., Body mass index is 43.25 kg/(m^2). General:  Obese AAF. Appears in no acute distress. Neck: Supple. No thyromegaly. No lymphadenopathy. Lungs: Clear bilaterally to auscultation without wheezes, rales, or rhonchi. Breathing is unlabored. Heart: Regular rhythm. No murmurs, rubs, or gallops. Msk:  Strength and tone normal for age.Except Obese.  Reports pain with palpation of bilateral scapula areas and reports pain with palpation of back throughout.  Had her do SLR and Hip Abduction Bilaterally--she moaned and groaned throughout entire exam, regardless of position, etc.    Extremities/Skin: Warm and dry. Neuro: Alert and oriented X 3. Moves all extremities spontaneously. Gait is normal. CNII-XII grossly in tact. Psych:  Responds to questions appropriately with a normal affect.     ASSESSMENT AND PLAN:  34 y.o. year old female with  1. Obesity  2. Chronic low back pain Continue Gabapentin. Add Cymbalta. Discussed proper expectations of this medication and htat it will not provide immediate relief but will take time to be effective.  - DULoxetine (CYMBALTA) 60 MG capsule; Take 1 capsule (60 mg total) by mouth daily.  Dispense: 30 capsule; Refill: 3  NO NARCOTICS RXED AT OV.  NO WORK NOTE GIVEN AT OV.   I WILL NOT RX PAIN MEDS IF SHE CALLS. I WILL NOT GIVE WORK NOTE IF SHE CALLS  Signed, [address] [place], [place], [facility] 11/05/2014 11:45 AM

## 2014-12-03 ENCOUNTER — Other Ambulatory Visit: Payer: Self-pay | Admitting: Family Medicine

## 2014-12-03 ENCOUNTER — Other Ambulatory Visit: Payer: Self-pay | Admitting: Adult Health

## 2014-12-03 NOTE — Telephone Encounter (Signed)
Medication refilled per protocol. 

## 2014-12-03 NOTE — Telephone Encounter (Signed)
Okay to refill? 

## 2014-12-03 NOTE — Telephone Encounter (Signed)
LRF 08/20/14 #15 + 1  LOV 11/04/14  OK refill?

## 2014-12-06 ENCOUNTER — Ambulatory Visit: Payer: Medicaid Other | Admitting: Family Medicine

## 2014-12-13 ENCOUNTER — Ambulatory Visit (HOSPITAL_COMMUNITY): Payer: Medicaid Other | Admitting: Physical Therapy

## 2014-12-20 ENCOUNTER — Ambulatory Visit (HOSPITAL_COMMUNITY): Payer: Medicaid Other | Admitting: Physical Therapy

## 2014-12-21 ENCOUNTER — Ambulatory Visit (HOSPITAL_COMMUNITY): Payer: Medicaid Other | Attending: Family Medicine | Admitting: Physical Therapy

## 2014-12-21 DIAGNOSIS — R29898 Other symptoms and signs involving the musculoskeletal system: Secondary | ICD-10-CM

## 2014-12-21 DIAGNOSIS — M545 Low back pain, unspecified: Secondary | ICD-10-CM

## 2014-12-21 DIAGNOSIS — R293 Abnormal posture: Secondary | ICD-10-CM | POA: Insufficient documentation

## 2014-12-21 DIAGNOSIS — M6289 Other specified disorders of muscle: Secondary | ICD-10-CM | POA: Diagnosis present

## 2014-12-21 DIAGNOSIS — M6281 Muscle weakness (generalized): Secondary | ICD-10-CM

## 2014-12-21 DIAGNOSIS — R198 Other specified symptoms and signs involving the digestive system and abdomen: Secondary | ICD-10-CM | POA: Insufficient documentation

## 2014-12-21 NOTE — Therapy (Signed)
[facility], [place], [address] Phone: [phone]   Fax:  [phone]  Physical Therapy Evaluation  Patient Details  Name: [name] MRN: [id number] Date of Birth: Aug 28, 1980 Referring Provider:  Alycia Rossetti, MD  Encounter Date: 12/21/2014      PT End of Session - 12/21/14 1519    Visit Number 1   Number of Visits 1   Authorization Type Medicaid    Authorization Time Period 12/21/14 to 02/20/15   PT Start Time 1437   PT Stop Time 1515   PT Time Calculation (min) 38 min   Activity Tolerance Patient tolerated treatment well;Patient limited by pain   Behavior During Therapy [facility] for tasks assessed/performed      Past Medical History  Diagnosis Date  . Anxiety   . Obesity   . Asthma   . Autoimmune disease, not elsewhere classified(279.49) 2013    Non specific- [facility], abnormal Bone Marrow signal  . Hypertension   . Allergy     banana/pineapple  . Anemia     post pregnancy  . GERD (gastroesophageal reflux disease)   . Neuromuscular disorder     lower back and bilat leg pain  . Headache(784.0)   . Scoliosis   . Breast mass, right 08/18/2012  . Breast lump in female 11/28/2012    Has tender round mass at 10-11 0'clock right breast 4 finger breaths from areola will [place]  . Polyclonal gammopathy 07/26/2013    Insignificant  . Menorrhagia 01/05/2014  . Dyspareunia 01/05/2014  . Other and unspecified ovarian cyst 01/12/2014  . Dysmenorrhea 03/23/2014  . History of abnormal cervical Pap smear 04/02/2014  . Vaginal irritation 09/30/2014    Past Surgical History  Procedure Laterality Date  . Tonsillectomy    . Dilation and curettage of uterus      There were no vitals filed for this visit.  Visit Diagnosis:  Midline low back pain without sciatica - Plan: PT plan of care cert/re-cert  Poor posture - Plan: PT plan of care cert/re-cert  Weakness of both lower extremities - Plan: PT plan  of care cert/re-cert  Proximal muscle weakness - Plan: PT plan of care cert/re-cert  Abdominal weakness - Plan: PT plan of care cert/re-cert      Subjective Assessment - 12/21/14 1439    Subjective Some days her back is very stiff; just last month her back went out completely and she had to be in bed for 3 days straight. Usually has pain in her back, which fluctuates from 5-10/10 pain. Reports that numbness and tingling goes down the front of her legs intermittently, comes and goes; full sensation in saddle area per patient.    Pertinent History Patient reports she has had back pain since she was 34 years old; reports that she was told she had some scoliosis but other than that is not aware of any major conditions of her back. She is a Optician, dispensing, has to do a lot of lifting and transport.    How long can you sit comfortably? Always changing position when sitting, has to shift a lot    How long can you stand comfortably? 15 minutes    How long can you walk comfortably? Depends- sometimes immediate pain, sometimes can walk a mile before her back starts to bother her    Patient Stated Goals reduce back pain, be able to stand and walk further    Currently in Pain? Yes  Pain Score 6    Pain Location Back   Pain Orientation Lower            [facility] PT Assessment - 12/21/14 0001    Assessment   Medical Diagnosis chronic low back pain    Onset Date/Surgical Date --  chronic    Next MD Visit August 24th or 25th with Dr. Buelah Manis    Precautions   Precautions None   Restrictions   Weight Bearing Restrictions No   Balance Screen   Has the patient fallen in the past 6 months No   Has the patient had a decrease in activity level because of a fear of falling?  Yes   Is the patient reluctant to leave their home because of a fear of falling?  No   Prior Function   Level of Independence Independent;Independent with basic ADLs;Independent with gait;Independent with transfers    Vocation Full time employment   Vocation Requirements personal care assistant    Observation/Other Assessments   Observations R anterior pelvic rotation noted, corrected with MET    Posture/Postural Control   Posture Comments forward head with B IR shoulders, flat thoracic kyphosis, increased lumbar lordosis, genu recervatum L>R   AROM   Right Hip External Rotation  --  WFL but slight pain    Right Hip Internal Rotation  42   Left Hip External Rotation  --  WFL but mild pain    Left Hip Internal Rotation  35   Lumbar Flexion 65   Lumbar Extension 25   Lumbar - Right Side Bend 40   Lumbar - Left Side Bend 23   Strength   Right Hip Flexion 4+/5   Right Hip Extension 4/5   Right Hip ABduction 2+/5   Left Hip Flexion 4+/5   Left Hip Extension 4/5   Left Hip ABduction 3+/5   Right Knee Flexion 4/5   Right Knee Extension 4-/5   Left Knee Flexion 3+/5   Left Knee Extension 4-/5   Right Ankle Dorsiflexion 4/5   Left Ankle Dorsiflexion 4/5   Ambulation/Gait   Gait Comments reduced rotation of trunk and pelvis, proximal muscle weakness, prontation B, reduced gait speed                           PT Education - 12/21/14 1518    Education provided Yes   Education Details visit limit from [facility] unless patient wants to be self-pay, HEP for home management   Person(s) Educated Patient   Methods Explanation;Handout   Comprehension Verbalized understanding;Returned demonstration          PT Short Term Goals - 12/21/14 1525    PT SHORT TERM GOAL #1   Title Patient to be independent in correctly and consistently performing appropriate HEP as assigned at initial eval    Time 1   Period Days           PT Long Term Goals - 12/21/14 1526    PT LONG TERM GOAL #1   Title One time visit only    Time 1   Period Days   Status New               Plan - 12/21/14 1520    Clinical Impression Statement Patient presents with chronic low back pain that she  reports she has had since she was 15, postural and gait deviations, lumbar and bilateral hip stiffness, muscle weakness in bilatearl lower extremities and  in proximal musculature/core, intermittent numbness in bilateral anterior lower extremities,  and reduced tolerance to functional weight bearing tasks due to pain. The patient reports she was told at age 27 that she had some scoliosis but nothing was ever done for it, does not know of any other major spine issues. She works as a Optician, dispensing and mentions that her back pain does influence her ability to perform her job efficiently. Due to insurance limitations, patient only eligible for evaluation at this time; assigned appropriate HEP to address functional limitations.    Pt will benefit from skilled therapeutic intervention in order to improve on the following deficits Abnormal gait;Decreased endurance;Hypomobility;Obesity;Decreased activity tolerance;Decreased strength;Pain;Decreased mobility;Difficulty walking;Decreased coordination;Impaired flexibility;Postural dysfunction   Clinical Impairments Affecting Rehab Potential chronic pain    PT Frequency One time visit   PT Duration Other (comment)  1 time visit    PT Treatment/Interventions ADLs/Self Care Home Management;Moist Heat;Gait training;Stair training;Functional mobility training;Therapeutic activities;Therapeutic exercise;Balance training;Neuromuscular re-education;Patient/family education;Manual techniques   PT Next Visit Plan one time visit    PT Home Exercise Plan given    Consulted and Agree with Plan of Care Patient         Problem List Patient Active Problem List   Diagnosis Date Noted  . Vaginal irritation 09/30/2014  . History of abnormal cervical Pap smear 04/02/2014  . Dysmenorrhea 03/23/2014  . Other and unspecified ovarian cyst 01/12/2014  . Menorrhagia 01/05/2014  . Dyspareunia 01/05/2014  . Polyclonal gammopathy 07/26/2013  . Dysplasia of cervix,  unspecified 03/31/2013  . DUB (dysfunctional uterine bleeding) 03/10/2013  . Breast lump in female 11/28/2012  . Heartburn 04/18/2012  . Insomnia 12/09/2011  . Asthma in adult 04/18/2011  . Hypertension 09/21/2010  . FATIGUE 12/13/2009  . HEADACHE 01/18/2009  . Obesity 11/03/2007  . Generalized anxiety disorder 11/03/2007  . Chronic low back pain 11/03/2007    Deniece Ree PT, DPT [place] [address] [place], [place], [address] Phone: [phone]   Fax:  [phone]

## 2014-12-21 NOTE — Patient Instructions (Signed)
   PIRIFORMIS AND HIP STRETCH - SEATED  While sitting in a chair, cross your affected leg on top of the other as shown.   Next, gently lean forward until a stretch is felt along the crossed leg.  Hold for 30 seconds, repeat twice each leg, 2-3 times per day.    While standing, place foot on stool or elevated surface.  Keep the foot of the leg that is being stretched in an upward facing direction and the hip in a neutral position.  Gently lean forward at the hip while keeping the spine in a neutral position. Hold for 30 seconds, and repeat twice each leg, 2-3 times per day.    BRIDGING  While lying on your back, tighten your lower abdominals, squeeze your buttocks and then raise your buttocks off the floor/bed as creating a "Bridge" with your body.  Repeat 10 times, 2-3 times per day.     STRAIGHT LEG RAISE - SLR  While lying or sitting, raise up your leg with a straight knee.  Keep the opposite knee bent with the foot planted to the ground.  Repeat 10 times each leg, 2-3 times per day.    HIP ABDUCTION - SIDELYING  While lying on your side, slowly raise up your top leg to the side. Keep your knee straight and maintain your toes pointed forward the entire time.   The bottom leg can be bent to stabilize your body. If you have pain, do not raise your leg as far; if it still hurts, you may do it in standing instead.   Repeat 10 times, 2-3 times per day.     PRONE HIP EXTENSION  While lying face down with your knee straight, slowly raise up leg off the ground. Do not lift your leg too high, or you will be using the muscles in your back instead of the correct ones.  Repeat 10 times, 2-3 times per day.  ABDOMINAL SQUEEZES  You can do this exercise in sitting/standing/laying down. Tighten your stomach muscles- it should feel like you are trying to suck your belly button to your spine. Hold for a count of 3 and release. Repeat 100 times per day. You may break this up however  you would like- IE 10 sets of 10, 5 sets of 20, etc, as long as the total is 100.    ON CHAIR OR SIDE OF BED - SEATED ALTERNATE ARM AND LEG  While seated on an exercise ball, raise a leg and opposite arm. Return limbs back down and then raise the opposite side.   Repeat 10 times, 2-3 times per day.     WALL SQUATS  Leaning up against a wall or closed door on your back, slide your body downward and then return back to upright position. As you are squatting, raise your arms overhead to help activate your core.   A door was used here because it was smoother and had less friction than the wall.   Knees should bend in line with the 2nd toe and not pass the front of the foot.  Repeat 10 times, 2-3 times per day.     IF THESE EXERCISES INCREASE YOUR PAIN, DROP THE NUMBER OF REPS YOU ARE DOING. YOU SHOULD BE DOING THEM 2 TIMES A DAY MINIMUM, BUT THE REPS CAN BE ADJUSTED DEPENDING ON YOUR PAIN

## 2015-01-04 ENCOUNTER — Ambulatory Visit (INDEPENDENT_AMBULATORY_CARE_PROVIDER_SITE_OTHER): Payer: Medicaid Other | Admitting: Family Medicine

## 2015-01-04 ENCOUNTER — Encounter: Payer: Self-pay | Admitting: Family Medicine

## 2015-01-04 VITALS — BP 128/74 | HR 70 | Temp 98.6°F | Resp 14 | Ht 65.5 in | Wt 266.0 lb

## 2015-01-04 DIAGNOSIS — J029 Acute pharyngitis, unspecified: Secondary | ICD-10-CM

## 2015-01-04 DIAGNOSIS — M545 Low back pain, unspecified: Secondary | ICD-10-CM

## 2015-01-04 DIAGNOSIS — E669 Obesity, unspecified: Secondary | ICD-10-CM | POA: Diagnosis not present

## 2015-01-04 DIAGNOSIS — J019 Acute sinusitis, unspecified: Secondary | ICD-10-CM | POA: Insufficient documentation

## 2015-01-04 DIAGNOSIS — M5410 Radiculopathy, site unspecified: Secondary | ICD-10-CM

## 2015-01-04 DIAGNOSIS — J0100 Acute maxillary sinusitis, unspecified: Secondary | ICD-10-CM

## 2015-01-04 DIAGNOSIS — G8929 Other chronic pain: Secondary | ICD-10-CM

## 2015-01-04 LAB — RAPID STREP SCREEN (MED CTR MEBANE ONLY): Streptococcus, Group A Screen (Direct): NEGATIVE

## 2015-01-04 MED ORDER — CYCLOBENZAPRINE HCL 10 MG PO TABS
10.0000 mg | ORAL_TABLET | Freq: Three times a day (TID) | ORAL | Status: DC | PRN
Start: 2015-01-04 — End: 2015-05-06

## 2015-01-04 MED ORDER — AMOXICILLIN-POT CLAVULANATE 875-125 MG PO TABS: 1.0000 | ORAL_TABLET | Freq: Two times a day (BID) | ORAL | Status: DC

## 2015-01-04 MED ORDER — HYDROCODONE-ACETAMINOPHEN 7.5-325 MG PO TABS: 1.0000 | ORAL_TABLET | Freq: Four times a day (QID) | ORAL | Status: DC | PRN

## 2015-01-04 MED ORDER — MUPIROCIN CALCIUM 2 % EX CREA: 1.0000 "application " | TOPICAL_CREAM | Freq: Two times a day (BID) | CUTANEOUS | Status: DC

## 2015-01-04 MED ORDER — FLUCONAZOLE 150 MG PO TABS: ORAL_TABLET | ORAL | Status: DC

## 2015-01-04 NOTE — Assessment & Plan Note (Signed)
Sinsuitis, hold flonase with nasal sore- likely staph, given bactroban for this Okay to use nasal saline claritin Augmentin given, diflucan due to yeast infections with antibiotics

## 2015-01-04 NOTE — Assessment & Plan Note (Signed)
Continue to reiterate need for weight loss and how this affects her back pain, joint pains

## 2015-01-04 NOTE — Assessment & Plan Note (Signed)
Persistent back pain worsening on that left side. She has tried physical therapy as well as muscle relaxers anti-inflammatory pain medication home physical therapy program which was prescribed and still has significant pain. 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 I do think that her obesity plays a role in her chronic back pain per above. I will try to get the MRI rescheduled.

## 2015-01-04 NOTE — Patient Instructions (Signed)
Use nasal saline rinse Stop flonase until the sore heals in your nose Use the antibiotic cream as prescribed for 5 days MRI to be scheduled if covered by insurance Work on weight loss!  F/U 4 months

## 2015-01-04 NOTE — Progress Notes (Signed)
Patient ID: [name], female   DOB: Oct 04, 1980, 34 y.o.   MRN: [id number]   Subjective:    Patient ID: [name], female    DOB: [birth year], 34 y.o.   MRN: [id number]  Patient presents for Illness and L Sided Back Pain  patient here with persistent left-sided back pain. 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 She continues to get pain that radiates to the left side as well as tingling and numbness. She did get a physical therapy and they set her up with a home exercise program as her Medicaid insurance would not cover further physical therapy visits. She still taking the Robaxin but this does not seem to help she is also taking hydrocodone. She tried gabapentin and this did not help she was placed on Cymbalta but she had side effects with this including feeling very jittery and nervous therefore she discontinued it. Her weight is actually up 7 pounds since I last saw her couple months ago.  Complaints of sinus pressure and drainage low-grade fever for the past 2 weeks. +Sore throat. She also feels that she has a sore in the left side of her nose and she has some pain over her left jaw area. She states when she was sneezing she felt a pop and has had some soreness there.    Review Of Systems:  GEN- denies fatigue, fever, weight loss,weakness, recent illness HEENT- denies eye drainage, change in vision, nasal discharge, CVS- denies chest pain, palpitations RESP- denies SOB, cough, wheeze ABD- denies N/V, change in stools, abd pain GU- denies dysuria, hematuria, dribbling, incontinence MSK- + joint pain, muscle aches, injury Neuro- denies headache, dizziness, syncope, seizure activity       Objective:    BP 128/74 mmHg  Pulse 70  Temp(Src) 98.6 F (37 C) (Oral)  Resp 14  Ht 5' 5.5" (1.664 m)  Wt 266 lb (120.657 kg)  BMI 43.58 kg/m2 GEN- NAD, alert and oriented  x3,obese HEENT- PERRL, EOMI, non injected sclera, pink conjunctiva, MMM, oropharynx mild injection, TM clear bilat no effusion,  + maxillary sinus tenderness, inflammed turbinates,  +Nasal drainage , left nares small pustule -TTP, mild TTP over left TMJ  Neck- Supple, no LAD CVS- RRR, no murmur RESP-CTAB MSK- TTP lumbar spine , left thoracic paraspinals- TTP with mild swelling, decreaed ROM, +SLR left side,good ROM HIPS Neuro- normal tone, deceased sensation along lateral aspect of thigh, decreased left great toe with monofilament, DTR in tact LE EXT- No edema Pulses- Radial, DP- 2+   Strep Negative     Assessment & Plan:      Problem List Items Addressed This Visit    Obesity   Chronic low back pain   Relevant Medications   cyclobenzaprine (FLEXERIL) 10 MG tablet   HYDROcodone-acetaminophen (NORCO) 7.5-325 MG per tablet   Acute sinusitis   Relevant Medications   amoxicillin-clavulanate (AUGMENTIN) 875-125 MG per tablet   fluconazole (DIFLUCAN) 150 MG tablet    Other Visit Diagnoses    Sore throat    -  Primary    Relevant Orders    Rapid Strep Screen (Completed)       Note: This dictation was prepared with Dragon dictation along with smaller phrase technology. Any transcriptional errors that result from this process are unintentional.

## 2015-01-24 ENCOUNTER — Ambulatory Visit (HOSPITAL_COMMUNITY): Payer: Medicaid Other

## 2015-02-04 ENCOUNTER — Ambulatory Visit (HOSPITAL_COMMUNITY): Payer: Medicaid Other

## 2015-02-22 ENCOUNTER — Ambulatory Visit: Payer: Medicaid Other | Admitting: Family Medicine

## 2015-03-16 ENCOUNTER — Ambulatory Visit (INDEPENDENT_AMBULATORY_CARE_PROVIDER_SITE_OTHER): Payer: Medicaid Other | Admitting: Family Medicine

## 2015-03-16 ENCOUNTER — Encounter: Payer: Self-pay | Admitting: Family Medicine

## 2015-03-16 VITALS — BP 144/88 | HR 88 | Temp 98.0°F | Resp 20 | Wt 271.0 lb

## 2015-03-16 DIAGNOSIS — R7302 Impaired glucose tolerance (oral): Secondary | ICD-10-CM

## 2015-03-16 DIAGNOSIS — J4520 Mild intermittent asthma, uncomplicated: Secondary | ICD-10-CM

## 2015-03-16 DIAGNOSIS — E669 Obesity, unspecified: Secondary | ICD-10-CM

## 2015-03-16 DIAGNOSIS — Z23 Encounter for immunization: Secondary | ICD-10-CM | POA: Diagnosis not present

## 2015-03-16 DIAGNOSIS — S8390XA Sprain of unspecified site of unspecified knee, initial encounter: Secondary | ICD-10-CM | POA: Insufficient documentation

## 2015-03-16 DIAGNOSIS — I10 Essential (primary) hypertension: Secondary | ICD-10-CM

## 2015-03-16 DIAGNOSIS — S8392XD Sprain of unspecified site of left knee, subsequent encounter: Secondary | ICD-10-CM

## 2015-03-16 MED ORDER — BECLOMETHASONE DIPROPIONATE 80 MCG/ACT IN AERS: 2.0000 | INHALATION_SPRAY | Freq: Two times a day (BID) | RESPIRATORY_TRACT | Status: DC

## 2015-03-16 MED ORDER — HYDROCHLOROTHIAZIDE 25 MG PO TABS: 25.0000 mg | ORAL_TABLET | Freq: Every day | ORAL | Status: DC

## 2015-03-16 NOTE — Assessment & Plan Note (Addendum)
Increase Qvar to 80 g 2 puffs twice a day. It is possible that she is given some irritation from the mold in her home. 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 Flu shot given

## 2015-03-16 NOTE — Assessment & Plan Note (Signed)
She needs sprain. No imaging at this time. I'll have her use ice and ibuprofen this just happened today. She also has a knee brace that she can wear for support.

## 2015-03-16 NOTE — Progress Notes (Signed)
Patient ID: [name], female   DOB: 01-Apr-1981, 34 y.o.   MRN: [id number]   Subjective:    Patient ID: [name], female    DOB: [birth year], 34 y.o.   MRN: [id number]  Patient presents for OTHER and Asthma  here with concern for asthma. She has some mold in her home. She's had more coughing and some wheezing. 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 She is using the Qvar and the albuterol as needed. She is due for flu shot. She's not had any productive cough.  She's had some episodes of headache and times where she feels weak and dizzy. She took her blood sugar randomly and it was 238. She's been monitoring her blood sugar at home and ranges from 70s to the 200s. Sometimes she skips her meals and ethmoid her blood sugar will drop. She did have an A1c done about 4 months ago which was normal. She however has gained weight since her last visit.  This morning when she got out of bed she stepped wrong and twisted her knee. She has been limping since. She states she was sent home from work. She has been using ice and a heating pad.    Review Of Systems:  GEN- denies fatigue, fever, weight loss,weakness, recent illness HEENT- denies eye drainage, change in vision, nasal discharge, CVS- denies chest pain, palpitations RESP- denies SOB, +cough, wheeze ABD- denies N/V, change in stools, abd pain GU- denies dysuria, hematuria, dribbling, incontinence MSK- + joint pain, muscle aches, injury Neuro- denies headache, dizziness, syncope, seizure activity       Objective:    BP 144/88 mmHg  Pulse 88  Temp(Src) 98 F (36.7 C) (Oral)  Resp 20  Wt 271 lb (122.925 kg) GEN- NAD, alert and oriented x3 HEENT- PERRL, EOMI, non injected sclera, pink conjunctiva, MMM, oropharynx clear CVS- RRR, no murmur RESP-CTAB MSK- Left knee- TTP lateral aspect, no effusion, pain with ROM, neg crepitus, liagments in tact   EXT- No edema Pulses- Radial  2+        Assessment & Plan:      Problem List Items Addressed This Visit    Obesity   Knee sprain    She needs sprain. No imaging at this time. I'll have her use ice and ibuprofen this just happened today. She also has a knee brace that she can wear for support.      Hypertension - Primary    Increase hydrochlorothiazide to 25 mg. She is gaining significant weight. She is at risk for diabetes. With the blood sugar fluctuations and then check an A1c today. We discussed healthy eating she is not exercising and she has chronic back pain and I think this is all weight related.      Relevant Medications   hydrochlorothiazide (HYDRODIURIL) 25 MG tablet   Other Relevant Orders   CBC with Differential/Platelet   Comprehensive metabolic panel   Glucose intolerance (impaired glucose tolerance)   Relevant Orders   Hemoglobin A1c   Asthma in adult    Increase Qvar to 80 g 2 puffs twice a day. It is possible that she is given some irritation from the mold in her home. 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 Flu shot given      Relevant Medications   beclomethasone (QVAR) 80 MCG/ACT inhaler    Other Visit Diagnoses    Need for  vaccination        Relevant Orders    Flu Vaccine QUAD 36+ mos PF IM (Fluarix & Fluzone Quad PF) (Completed)       Note: This dictation was prepared with Dragon dictation along with smaller phrase technology. Any transcriptional errors that result from this process are unintentional.

## 2015-03-16 NOTE — Assessment & Plan Note (Signed)
Increase hydrochlorothiazide to 25 mg. She is gaining significant weight. She is at risk for diabetes. With the blood sugar fluctuations and then check an A1c today. We discussed healthy eating she is not exercising and she has chronic back pain and I think this is all weight related.

## 2015-03-16 NOTE — Patient Instructions (Signed)
Eat small meals throughout the day Qvar increased to 80mg  twice a day  HCTZ increased to 25mg , you can take 2 of the 12.5mg  tabs We will call with lab results ICE your knee, take the motrin  F/U as previous

## 2015-03-17 LAB — COMPREHENSIVE METABOLIC PANEL
ALK PHOS: 91 U/L (ref 33–115)
ALT: 5 U/L — AB (ref 6–29)
AST: 11 U/L (ref 10–30)
Albumin: 3.8 g/dL (ref 3.6–5.1)
BILIRUBIN TOTAL: 0.4 mg/dL (ref 0.2–1.2)
BUN: 8 mg/dL (ref 7–25)
CO2: 25 mmol/L (ref 20–31)
Calcium: 9.3 mg/dL (ref 8.6–10.2)
Chloride: 104 mmol/L (ref 98–110)
Creat: 0.81 mg/dL (ref 0.50–1.10)
GLUCOSE: 119 mg/dL — AB (ref 70–99)
Potassium: 4 mmol/L (ref 3.5–5.3)
Sodium: 137 mmol/L (ref 135–146)
Total Protein: 7.2 g/dL (ref 6.1–8.1)

## 2015-03-17 LAB — CBC WITH DIFFERENTIAL/PLATELET
BASOS ABS: 0.1 10*3/uL (ref 0.0–0.1)
Basophils Relative: 1 % (ref 0–1)
EOS ABS: 0.5 10*3/uL (ref 0.0–0.7)
EOS PCT: 4 % (ref 0–5)
HCT: 41.2 % (ref 36.0–46.0)
Hemoglobin: 13.6 g/dL (ref 12.0–15.0)
LYMPHS PCT: 31 % (ref 12–46)
Lymphs Abs: 3.7 10*3/uL (ref 0.7–4.0)
MCH: 30 pg (ref 26.0–34.0)
MCHC: 33 g/dL (ref 30.0–36.0)
MCV: 90.7 fL (ref 78.0–100.0)
MPV: 10.7 fL (ref 8.6–12.4)
Monocytes Absolute: 0.6 10*3/uL (ref 0.1–1.0)
Monocytes Relative: 5 % (ref 3–12)
Neutro Abs: 7 10*3/uL (ref 1.7–7.7)
Neutrophils Relative %: 59 % (ref 43–77)
PLATELETS: 397 10*3/uL (ref 150–400)
RBC: 4.54 MIL/uL (ref 3.87–5.11)
RDW: 13.2 % (ref 11.5–15.5)
WBC: 11.8 10*3/uL — AB (ref 4.0–10.5)

## 2015-03-17 LAB — HEMOGLOBIN A1C
Hgb A1c MFr Bld: 5.2 % (ref <5.7)
Mean Plasma Glucose: 103 mg/dL (ref <117)

## 2015-05-06 ENCOUNTER — Encounter: Payer: Self-pay | Admitting: Family Medicine

## 2015-05-06 ENCOUNTER — Ambulatory Visit (INDEPENDENT_AMBULATORY_CARE_PROVIDER_SITE_OTHER): Payer: Medicaid Other | Admitting: Family Medicine

## 2015-05-06 VITALS — BP 136/84 | HR 78 | Temp 98.8°F | Resp 16 | Ht 65.5 in | Wt 275.0 lb

## 2015-05-06 DIAGNOSIS — M545 Low back pain, unspecified: Secondary | ICD-10-CM

## 2015-05-06 DIAGNOSIS — G8929 Other chronic pain: Secondary | ICD-10-CM | POA: Diagnosis not present

## 2015-05-06 DIAGNOSIS — J4520 Mild intermittent asthma, uncomplicated: Secondary | ICD-10-CM | POA: Diagnosis not present

## 2015-05-06 DIAGNOSIS — G4700 Insomnia, unspecified: Secondary | ICD-10-CM

## 2015-05-06 DIAGNOSIS — E669 Obesity, unspecified: Secondary | ICD-10-CM

## 2015-05-06 MED ORDER — CYCLOBENZAPRINE HCL 10 MG PO TABS: 10.0000 mg | ORAL_TABLET | Freq: Three times a day (TID) | ORAL | Status: DC | PRN

## 2015-05-06 MED ORDER — HYDROCHLOROTHIAZIDE 25 MG PO TABS: 25.0000 mg | ORAL_TABLET | Freq: Every day | ORAL | Status: DC

## 2015-05-06 MED ORDER — ZOLPIDEM TARTRATE 10 MG PO TABS: 10.0000 mg | ORAL_TABLET | Freq: Every evening | ORAL | Status: DC | PRN

## 2015-05-06 NOTE — Assessment & Plan Note (Signed)
Continue with Flexeril, I think that her obesity is a significant factor to all of her joint aches and pains and diarrhea rated this to her multiple times. I will send her to a nutritionist also gave her dietary handout. She canceled her MRI therefore I do not think that her back has been that severe so I'm not to reschedule this at this time.

## 2015-05-06 NOTE — Assessment & Plan Note (Signed)
Increase Ambien to 10 mg as needed

## 2015-05-06 NOTE — Assessment & Plan Note (Signed)
Improved with increased dose of Qvar has albuterol for rescue inhaler

## 2015-05-06 NOTE — Progress Notes (Signed)
Patient ID: [name], female   DOB: Aug 08, 1980, 35 y.o.   MRN: [id number]   Subjective:    Patient ID: [name], female    DOB: 21-Jan-1981, 35 y.o.   MRN: [id number]  Patient presents for 4 month F/U and Back Pain  Pt here for F/U last visit BP meds increased to HCTZ 25mg , Her weight continues to increase now at 275lbs up 4 more pounds which contributes to more of her back and joint pains. At our visit in Sept MRI was scheduled however she cancelled twice. Her chronic back pain of unknown pathology as nothing severe on the previous MRI continues to be an issue, she has seen PT and prescribed hoe PT exercises, has seen Neurology and referral to pain clinic in the past. She cancelled MRI appt from Sept twice. Had back pain yesterday and missed work, then stated she had been fine with her back for the past few months until yesterday, today is much better.    Asthma much improved with change in Qvar, only used rescue inhaler once  States that she is not able to stay asleep with the Ambien she continues to wake up throughout the night and does not feel very restful   Review Of Systems:  GEN- denies fatigue, fever, weight loss,weakness, recent illness HEENT- denies eye drainage, change in vision, nasal discharge, CVS- denies chest pain, palpitations RESP- denies SOB, cough, wheeze ABD- denies N/V, change in stools, abd pain GU- denies dysuria, hematuria, dribbling, incontinence MSK- + joint pain, muscle aches, injury Neuro- denies headache, dizziness, syncope, seizure activity       Objective:    BP 140/76 mmHg  Pulse 78  Temp(Src) 98.8 F (37.1 C) (Oral)  Resp 16  Ht 5' 5.5" (1.664 m)  Wt 275 lb (124.739 kg)  BMI 45.05 kg/m2 GEN- NAD, alert and oriented x3 HEENT- PERRL, EOMI, non injected sclera, pink conjunctiva, MMM, oropharynx clear CVS- RRR, no murmur RESP-CTAB EXT- No edema Pulses- Radial  2+        Assessment & Plan:      Problem List Items  Addressed This Visit    None      Note: This dictation was prepared with Dragon dictation along with smaller phrase technology. Any transcriptional errors that result from this process are unintentional.

## 2015-05-06 NOTE — Patient Instructions (Addendum)
Continue currents medications  Ambien increased to 10mg   Nutrition referral  Give note for missed work yesterday can return today  F/U 4 months Diabetes Mellitus and Food It is important for you to manage your blood sugar (glucose) level. Your blood glucose level can be greatly affected by what you eat. Eating healthier foods in the appropriate amounts throughout the day at about the same time each day will help you control your blood glucose level. It can also help slow or prevent worsening of your diabetes mellitus. Healthy eating may even help you improve the level of your blood pressure and reach or maintain a healthy weight.  General recommendations for healthful eating and cooking habits include:  Eating meals and snacks regularly. Avoid going long periods of time without eating to lose weight.  Eating a diet that consists mainly of plant-based foods, such as fruits, vegetables, nuts, legumes, and whole grains.  Using low-heat cooking methods, such as baking, instead of high-heat cooking methods, such as deep frying. Work with your dietitian to make sure you understand how to use the Nutrition Facts information on food labels. HOW CAN FOOD AFFECT ME? Carbohydrates Carbohydrates affect your blood glucose level more than any other type of food. Your dietitian will help you determine how many carbohydrates to eat at each meal and teach you how to count carbohydrates. Counting carbohydrates is important to keep your blood glucose at a healthy level, especially if you are using insulin or taking certain medicines for diabetes mellitus. Alcohol Alcohol can cause sudden decreases in blood glucose (hypoglycemia), especially if you use insulin or take certain medicines for diabetes mellitus. Hypoglycemia can be a life-threatening condition. Symptoms of hypoglycemia (sleepiness, dizziness, and disorientation) are similar to symptoms of having too much alcohol.  If your health care provider has given  you approval to drink alcohol, do so in moderation and use the following guidelines:  Women should not have more than one drink per day, and men should not have more than two drinks per day. One drink is equal to:  12 oz of beer.  5 oz of wine.  1 oz of hard liquor.  Do not drink on an empty stomach.  Keep yourself hydrated. Have water, diet soda, or unsweetened iced tea.  Regular soda, juice, and other mixers might contain a lot of carbohydrates and should be counted. WHAT FOODS ARE NOT RECOMMENDED? As you make food choices, it is important to remember that all foods are not the same. Some foods have fewer nutrients per serving than other foods, even though they might have the same number of calories or carbohydrates. It is difficult to get your body what it needs when you eat foods with fewer nutrients. Examples of foods that you should avoid that are high in calories and carbohydrates but low in nutrients include:  Trans fats (most processed foods list trans fats on the Nutrition Facts label).  Regular soda.  Juice.  Candy.  Sweets, such as cake, pie, doughnuts, and cookies.  Fried foods. WHAT FOODS CAN I EAT? Eat nutrient-rich foods, which will nourish your body and keep you healthy. The food you should eat also will depend on several factors, including:  The calories you need.  The medicines you take.  Your weight.  Your blood glucose level.  Your blood pressure level.  Your cholesterol level. You should eat a variety of foods, including:  Protein.  Lean cuts of meat.  Proteins low in saturated fats, such as fish, egg whites,  and beans. Avoid processed meats.  Fruits and vegetables.  Fruits and vegetables that may help control blood glucose levels, such as apples, mangoes, and yams.  Dairy products.  Choose fat-free or low-fat dairy products, such as milk, yogurt, and cheese.  Grains, bread, pasta, and rice.  Choose whole grain products, such as  multigrain bread, whole oats, and brown rice. These foods may help control blood pressure.  Fats.  Foods containing healthful fats, such as nuts, avocado, olive oil, canola oil, and fish. DOES EVERYONE WITH DIABETES MELLITUS HAVE THE SAME MEAL PLAN? Because every person with diabetes mellitus is different, there is not one meal plan that works for everyone. It is very important that you meet with a dietitian who will help you create a meal plan that is just right for you.   This information is not intended to replace advice given to you by your health care provider. Make sure you discuss any questions you have with your health care provider.   Document Released: 01/11/2005 Document Revised: 05/07/2014 Document Reviewed: 03/13/2013 Elsevier Interactive Patient Education [employer].

## 2015-05-31 ENCOUNTER — Ambulatory Visit (INDEPENDENT_AMBULATORY_CARE_PROVIDER_SITE_OTHER): Payer: Medicaid Other | Admitting: Adult Health

## 2015-05-31 ENCOUNTER — Encounter: Payer: Self-pay | Admitting: Adult Health

## 2015-05-31 ENCOUNTER — Other Ambulatory Visit (HOSPITAL_COMMUNITY)
Admission: RE | Admit: 2015-05-31 | Discharge: 2015-05-31 | Disposition: A | Discharge status: Home / Self Care | Payer: Medicaid Other | Source: Ambulatory Visit | Attending: Adult Health | Admitting: Adult Health

## 2015-05-31 VITALS — BP 130/70 | HR 100 | Ht 65.0 in | Wt 274.0 lb

## 2015-05-31 DIAGNOSIS — Z0000 Encounter for general adult medical examination without abnormal findings: Secondary | ICD-10-CM | POA: Diagnosis not present

## 2015-05-31 DIAGNOSIS — N644 Mastodynia: Secondary | ICD-10-CM

## 2015-05-31 DIAGNOSIS — Z113 Encounter for screening for infections with a predominantly sexual mode of transmission: Secondary | ICD-10-CM | POA: Diagnosis present

## 2015-05-31 DIAGNOSIS — Z124 Encounter for screening for malignant neoplasm of cervix: Secondary | ICD-10-CM

## 2015-05-31 DIAGNOSIS — Z01419 Encounter for gynecological examination (general) (routine) without abnormal findings: Secondary | ICD-10-CM

## 2015-05-31 DIAGNOSIS — G8929 Other chronic pain: Secondary | ICD-10-CM

## 2015-05-31 DIAGNOSIS — Z1151 Encounter for screening for human papillomavirus (HPV): Secondary | ICD-10-CM | POA: Insufficient documentation

## 2015-05-31 DIAGNOSIS — R10814 Left lower quadrant abdominal tenderness: Secondary | ICD-10-CM

## 2015-05-31 DIAGNOSIS — Z8742 Personal history of other diseases of the female genital tract: Secondary | ICD-10-CM

## 2015-05-31 HISTORY — DX: Left lower quadrant abdominal tenderness: R10.814

## 2015-05-31 HISTORY — DX: Other chronic pain: G89.29

## 2015-05-31 NOTE — Patient Instructions (Signed)
Try 50 mg B6 Return in 1 week for GYN [place] Physical in 1 year Have sex day 7-24 of cycle every other day, pee before sex and lay there after sex Take folic acid [id number] mcg or OTC prenatal vitamin

## 2015-05-31 NOTE — Progress Notes (Signed)
Patient ID: [name], female   DOB: 03-01-1981, 35 y.o.   MRN: [id number] History of Present Illness: [name] is a 35 year old black female in for well woman gyn exam and pap, she complains of breast pain, bilaterally.And she would like to be pregnant in future.Periods are longer, has stopped taking megace. PCP is Dr Buelah Manis.   Current Medications, Allergies, Past Medical History, Past Surgical History, Family History and Social History were reviewed in [facility] record.     Review of Systems: Patient denies any headaches, hearing loss, fatigue, blurred vision, shortness of breath, chest pain, abdominal pain, problems with bowel movements, urination, or intercourse. No joint pain or mood swings.See HPI for positives.    Physical Exam:BP 130/70 mmHg  Pulse 100  Ht 5\' 5"  (1.651 m)  Wt 274 lb (124.286 kg)  BMI 45.60 kg/m2  LMP 05/11/2015 General:  Well developed, well nourished, no acute distress Skin:  Warm and dry Neck:  Midline trachea, normal thyroid, good ROM, no lymphadenopathy Lungs; Clear to auscultation bilaterally Breast:  No dominant palpable mass, retraction, or nipple discharge, she has large heavy breasts Cardiovascular: Regular rate and rhythm Abdomen:  Soft, non tender, no hepatosplenomegaly Pelvic:  External genitalia is normal in appearance, no lesions.  The vagina is normal in appearance. Urethra has no lesions or masses. The cervix is bulbous.Pap with HPV and GC/CHL performed.  Uterus is felt to be normal size, shape, and contour.  No adnexal masses, LLQ tenderness noted.Bladder is non tender, no masses felt. Extremities/musculoskeletal:  No swelling or varicosities noted, no clubbing or cyanosis Psych:  No mood changes, alert and cooperative,seems happy   Impression: Well woman gyn exam and pap Breast pain LLQ tenderness History of abnormal pap  Plan: Return in 1 week for gyn [place] Physical in 1 year, pap in 3 if normal Take  folic acid or prenatal vitamin Try B6 50 mg, decrease caffeine Labs with Dr Buelah Manis

## 2015-06-03 LAB — CYTOLOGY - PAP

## 2015-06-08 ENCOUNTER — Other Ambulatory Visit: Payer: Medicaid Other

## 2015-06-10 ENCOUNTER — Ambulatory Visit: Payer: Medicaid Other | Admitting: Nutrition

## 2015-06-15 ENCOUNTER — Ambulatory Visit (INDEPENDENT_AMBULATORY_CARE_PROVIDER_SITE_OTHER): Payer: Medicaid Other

## 2015-06-15 DIAGNOSIS — R10814 Left lower quadrant abdominal tenderness: Secondary | ICD-10-CM | POA: Diagnosis not present

## 2015-06-15 DIAGNOSIS — N83202 Unspecified ovarian cyst, left side: Secondary | ICD-10-CM

## 2015-06-15 DIAGNOSIS — N854 Malposition of uterus: Secondary | ICD-10-CM

## 2015-06-15 NOTE — Progress Notes (Signed)
PELVIC US TA/TV: Homogenous anteverted uterus,normal rt ov,simple cyst lt ov 3.1 x 2.2 x 3.2cm(mobile),EEC 5 mm,small amount of simple cul de sac fluid,no pain during ultrasound

## 2015-06-16 ENCOUNTER — Telehealth: Payer: Self-pay | Admitting: Adult Health

## 2015-06-16 NOTE — Telephone Encounter (Signed)
Pt aware US showed simple cyst left ovary, take aleve or advil, or if persists, maybe trial OCs

## 2015-07-08 ENCOUNTER — Emergency Department (HOSPITAL_COMMUNITY)
Admission: EM | Admit: 2015-07-08 | Discharge: 2015-07-08 | Disposition: A | Discharge status: Home / Self Care | Payer: Medicaid Other | Attending: Emergency Medicine | Admitting: Emergency Medicine

## 2015-07-08 ENCOUNTER — Emergency Department (HOSPITAL_COMMUNITY): Payer: Medicaid Other

## 2015-07-08 ENCOUNTER — Ambulatory Visit: Payer: Medicaid Other | Admitting: Nutrition

## 2015-07-08 ENCOUNTER — Encounter (HOSPITAL_COMMUNITY): Payer: Self-pay | Admitting: *Deleted

## 2015-07-08 DIAGNOSIS — I10 Essential (primary) hypertension: Secondary | ICD-10-CM | POA: Insufficient documentation

## 2015-07-08 DIAGNOSIS — E669 Obesity, unspecified: Secondary | ICD-10-CM | POA: Insufficient documentation

## 2015-07-08 DIAGNOSIS — J45909 Unspecified asthma, uncomplicated: Secondary | ICD-10-CM | POA: Diagnosis not present

## 2015-07-08 DIAGNOSIS — M545 Low back pain, unspecified: Secondary | ICD-10-CM

## 2015-07-08 LAB — URINALYSIS, ROUTINE W REFLEX MICROSCOPIC
Bilirubin Urine: NEGATIVE
GLUCOSE, UA: NEGATIVE mg/dL
Ketones, ur: NEGATIVE mg/dL
Leukocytes, UA: NEGATIVE
Nitrite: NEGATIVE
PH: 6.5 (ref 5.0–8.0)
Protein, ur: NEGATIVE mg/dL
SPECIFIC GRAVITY, URINE: 1.02 (ref 1.005–1.030)

## 2015-07-08 LAB — POC URINE PREG, ED: Preg Test, Ur: NEGATIVE

## 2015-07-08 LAB — URINE MICROSCOPIC-ADD ON

## 2015-07-08 MED ORDER — HYDROCODONE-ACETAMINOPHEN 5-325 MG PO TABS: ORAL_TABLET | ORAL | Status: DC

## 2015-07-08 MED ORDER — OXYCODONE-ACETAMINOPHEN 5-325 MG PO TABS
1.0000 | ORAL_TABLET | Freq: Once | ORAL | Status: AC
  Administered 2015-07-08: 1 via ORAL
  Filled 2015-07-08: qty 1

## 2015-07-08 NOTE — Discharge Instructions (Signed)

## 2015-07-08 NOTE — ED Provider Notes (Signed)
CSN: [id number]     Arrival date & time 07/08/15  2045 History   First MD Initiated Contact with Patient 07/08/15 2122     Chief Complaint  Patient presents with  . Back Pain     (Consider location/radiation/quality/duration/timing/severity/associated sxs/prior Treatment) HPI   [name] is a 35 y.o. female who presents to the Emergency Department complaining of left sided low back pain for 2 days.  She describes a sharp pain to her left lower back and buttock that is worse with bending and twisting movements.  Pain improves at rest.  She reports a hx of scoliosis and recurrent back pain as a result.  She has tried OTC analgesics without relief.  She denies fever, chills, abd pain, numbness or weakness of the lower extremities and pain radiating into the left leg.     Past Medical History  Diagnosis Date  . Anxiety   . Obesity   . Asthma   . Autoimmune disease, not elsewhere classified(279.49) 2013    Non specific- [facility], abnormal Bone Marrow signal  . Hypertension   . Allergy     banana/pineapple  . Anemia     post pregnancy  . GERD (gastroesophageal reflux disease)   . Neuromuscular disorder ([place])     lower back and bilat leg pain  . Headache(784.0)   . Scoliosis   . Breast mass, right 08/18/2012  . Breast lump in female 11/28/2012    Has tender round mass at 10-11 0'clock right breast 4 finger breaths from areola will [place]  . Polyclonal gammopathy 07/26/2013    Insignificant  . Menorrhagia 01/05/2014  . Dyspareunia 01/05/2014  . Other and unspecified ovarian cyst 01/12/2014  . Dysmenorrhea 03/23/2014  . History of abnormal cervical Pap smear 04/02/2014  . Vaginal irritation 09/30/2014  . LLQ abdominal tenderness 05/31/2015  . Chronic breast pain 05/31/2015  . Vaginal Pap smear, abnormal    Past Surgical History  Procedure Laterality Date  . Tonsillectomy    . Dilation and curettage of uterus     Family History  Problem Relation Age of Onset  .  Hypertension Mother   . Diabetes Mother   . Heart disease Mother   . Diabetes Father   . Cancer Paternal Aunt     breast  . Hypertension Maternal Grandmother   . Diabetes Son     pre diabetic  . Other Son     overactive bladder   Social History  Substance Use Topics  . Smoking status: Never Smoker   . Smokeless tobacco: Never Used  . Alcohol Use: No   OB History    Gravida Para Term Preterm AB TAB SAB Ectopic Multiple Living   2 1   1  1   1      Review of Systems  Constitutional: Negative for fever.  Respiratory: Negative for shortness of breath.   Gastrointestinal: Negative for vomiting, abdominal pain and constipation.  Genitourinary: Negative for dysuria, hematuria, flank pain, decreased urine volume and difficulty urinating.  Musculoskeletal: Positive for back pain. Negative for joint swelling.  Skin: Negative for rash.  Neurological: Negative for weakness and numbness.  All other systems reviewed and are negative.     Allergies  Banana and Pineapple  Home Medications   Prior to Admission medications   Medication Sig Start Date End Date Taking? Authorizing Provider  albuterol (PROVENTIL HFA;VENTOLIN HFA) 108 (90 BASE) MCG/ACT inhaler Inhale 2 puffs into the lungs every 4 (four) hours as needed. 10/22/14  Alycia Rossetti, MD  beclomethasone (QVAR) 80 MCG/ACT inhaler Inhale 2 puffs into the lungs 2 (two) times daily. 03/16/15   Alycia Rossetti, MD  cyclobenzaprine (FLEXERIL) 10 MG tablet Take 1 tablet (10 mg total) by mouth 3 (three) times daily as needed for muscle spasms. 05/06/15   Alycia Rossetti, MD  fluticasone (FLONASE) 50 MCG/ACT nasal spray Place 2 sprays into both nostrils daily. 06/21/14   Alycia Rossetti, MD  hydrochlorothiazide (HYDRODIURIL) 25 MG tablet Take 1 tablet (25 mg total) by mouth daily. 05/06/15   Alycia Rossetti, MD  ibuprofen (ADVIL,MOTRIN) 800 MG tablet Take 1 tablet (800 mg total) by mouth every 8 (eight) hours as needed. 06/21/14   Alycia Rossetti, MD  loratadine (CLARITIN) 10 MG tablet Take 1 tablet (10 mg total) by mouth daily. Patient taking differently: Take 10 mg by mouth as needed.  09/04/13   Alycia Rossetti, MD  potassium chloride (K-DUR) 10 MEQ tablet Take 1 tablet (10 mEq total) by mouth daily. 10/13/13   Alycia Rossetti, MD  zolpidem (AMBIEN) 10 MG tablet Take 1 tablet (10 mg total) by mouth at bedtime as needed for sleep. 05/06/15 06/05/15  Alycia Rossetti, MD   BP 157/95 mmHg  Pulse 96  Temp(Src) 98.2 F (36.8 C) (Oral)  Resp 20  Ht 5\' 5"  (1.651 m)  Wt 99.791 kg  BMI 36.61 kg/m2  SpO2 100%  LMP 06/21/2015 Physical Exam  Constitutional: She is oriented to person, place, and time. She appears well-developed and well-nourished. No distress.  HENT:  Head: Normocephalic and atraumatic.  Neck: Normal range of motion. Neck supple.  Cardiovascular: Normal rate, regular rhythm, normal heart sounds and intact distal pulses.   No murmur heard. Pulmonary/Chest: Effort normal and breath sounds normal. No respiratory distress.  Abdominal: Soft. She exhibits no distension. There is no tenderness.  Musculoskeletal: She exhibits tenderness. She exhibits no edema.       Lumbar back: She exhibits tenderness and pain. She exhibits normal range of motion, no swelling, no deformity, no laceration and normal pulse.  ttp of the left lumbar spine and paraspinal muscles.  DP pulses are brisk and symmetrical.  Positive SLR on left Distal sensation intact.  Pt has 5/5 strength against resistance of bilateral lower extremities.     Neurological: She is alert and oriented to person, place, and time. She has normal strength. No sensory deficit. She exhibits normal muscle tone. Coordination and gait normal.  Reflex Scores:      Patellar reflexes are 2+ on the right side and 2+ on the left side.      Achilles reflexes are 2+ on the right side and 2+ on the left side. Skin: Skin is warm and dry. No rash noted.  Nursing note and vitals  reviewed.   ED Course  Procedures (including critical care time) Labs Review Labs Reviewed  URINALYSIS, ROUTINE W REFLEX MICROSCOPIC (NOT AT [facility])  POC URINE PREG, ED    Imaging Review Dg Lumbar Spine Complete  07/08/2015  CLINICAL DATA:  35 year old female with history of left-sided back pain since Wednesday morning. Pain worse with movement. EXAM: LUMBAR SPINE - COMPLETE 4+ VIEW COMPARISON:  Lumbar spine MRI 02/11/2013. FINDINGS: There is no evidence of lumbar spine fracture. Alignment is normal. Intervertebral disc spaces are maintained. IMPRESSION: Negative. Electronically Signed   By: Vinnie Langton M.D.   On: 07/08/2015 22:06     I have personally reviewed and evaluated these images and  lab results as part of my medical decision-making.   EKG Interpretation None      MDM   Final diagnoses:  Left-sided low back pain without sciatica    Vital stable, pt well appearing.  Pt ambulates with a steady gait.  No concerning sx's for emergent neurological or infectious process.  Pt agrees to symptomatic tx and close PMD f/u.  She appears stable for d/c     Kem Parkinson, PA-C 07/11/15 2256  Elnora Morrison, MD 07/13/15 0120

## 2015-07-08 NOTE — ED Notes (Signed)
Pt c/o left lower back pain that started Wednesday, worse with movement, denies any injury, denies any urinary symptoms,

## 2015-07-15 ENCOUNTER — Ambulatory Visit (INDEPENDENT_AMBULATORY_CARE_PROVIDER_SITE_OTHER): Payer: Medicaid Other | Admitting: Family Medicine

## 2015-07-15 VITALS — BP 128/74 | HR 72 | Temp 98.2°F | Resp 18 | Ht 65.0 in | Wt 282.0 lb

## 2015-07-15 DIAGNOSIS — E669 Obesity, unspecified: Secondary | ICD-10-CM

## 2015-07-15 DIAGNOSIS — M5442 Lumbago with sciatica, left side: Secondary | ICD-10-CM | POA: Diagnosis not present

## 2015-07-15 MED ORDER — OXYCODONE-ACETAMINOPHEN 5-325 MG PO TABS: 1.0000 | ORAL_TABLET | Freq: Four times a day (QID) | ORAL | Status: DC | PRN

## 2015-07-15 MED ORDER — PHENTERMINE HCL 37.5 MG PO TABS: 37.5000 mg | ORAL_TABLET | Freq: Every day | ORAL | Status: DC

## 2015-07-15 MED ORDER — METHYLPREDNISOLONE 4 MG PO TBPK: ORAL_TABLET | ORAL | Status: DC

## 2015-07-15 NOTE — Patient Instructions (Signed)
Start 1/2 tablet of phentermine for 2 weeks, then go up to 1 pill Go to nutriontist  Take pain medication and steroids before you start the diet pill  F/U 2 months

## 2015-07-15 NOTE — Progress Notes (Signed)
Patient ID: [name], female   DOB: 25-Jun-1980, 35 y.o.   MRN: [id number]   Subjective:    Patient ID: [name], female    DOB: [birth year], 35 y.o.   MRN: [id number]  Patient presents for L Sided Back Pain  Here with acute back pain. Today she complains of left-sided back pain that was radiating down into her buttocks. She's not had any pain on the right side of her back. She states this started last week and became very severe therefore she went to the emergency room x-ray was normal with regards to alignment and disc space. She was told to take her Flexeril she was also prescribed Norco. She states that the pain has not let up she was told it was likely spasms in her back. She does not remember particular injury and states initially started as a little pulling sensation and then deteriorated to now where she has difficulty bending over putting on her clothing because of the back pain. She's also been using Motrin and she tried her old Robaxin   Review Of Systems:  GEN- denies fatigue, fever, weight loss,weakness, recent illness HEENT- denies eye drainage, change in vision, nasal discharge, CVS- denies chest pain, palpitations RESP- denies SOB, cough, wheeze ABD- denies N/V, change in stools, abd pain GU- denies dysuria, hematuria, dribbling, incontinence MSK- + joint pain, muscle aches, injury Neuro- denies headache, dizziness, syncope, seizure activity       Objective:    BP 128/74 mmHg  Pulse 72  Temp(Src) 98.2 F (36.8 C) (Oral)  Resp 18  Ht 5\' 5"  (1.651 m)  Wt 282 lb (127.914 kg)  BMI 46.93 kg/m2  LMP 06/21/2015 GEN- NAD, alert and oriented x3 HEENT- PERRL, EOMI, non injected sclera, pink conjunctiva, MMM, oropharynx clear Neck- Supple, FROM  CVS- RRR, no murmur RESP-CTAB ABD-NABS,soft,NT,ND MSK- TTP left parapinals, +slr, decreased ROM Spine , antalgic gait, strength decreased left compared to right- pain mediated Neuro- normal tone, sensation  in tact EXT- No edema Pulses- Radial,  2+        Assessment & Plan:      Problem List Items Addressed This Visit    Obesity    Morbidly obese, with recurrent back pain and joint pains, despite normal imaging She needs significant weight loss Trial of Phentermine once she complete meds for back flare She has appt for nutritionist rescheduled for this month      Relevant Medications   phentermine (ADIPEX-P) 37.5 MG tablet    Other Visit Diagnoses    Acute back pain with sciatica, left    -  Primary    Medrol dosepak, percocet, heating pad    Relevant Medications    methylPREDNISolone (MEDROL DOSEPAK) 4 MG TBPK tablet    oxyCODONE-acetaminophen (ROXICET) 5-325 MG tablet       Note: This dictation was prepared with Dragon dictation along with smaller phrase technology. Any transcriptional errors that result from this process are unintentional.

## 2015-07-17 ENCOUNTER — Encounter: Payer: Self-pay | Admitting: Family Medicine

## 2015-07-17 NOTE — Assessment & Plan Note (Signed)
Morbidly obese, with recurrent back pain and joint pains, despite normal imaging She needs significant weight loss Trial of Phentermine once she complete meds for back flare She has appt for nutritionist rescheduled for this month

## 2015-08-03 ENCOUNTER — Encounter (HOSPITAL_COMMUNITY): Payer: Self-pay

## 2015-08-23 ENCOUNTER — Telehealth: Payer: Self-pay | Admitting: Adult Health

## 2015-08-23 ENCOUNTER — Encounter: Payer: Self-pay | Admitting: Adult Health

## 2015-08-23 ENCOUNTER — Ambulatory Visit: Payer: Medicaid Other | Admitting: Women's Health

## 2015-08-23 ENCOUNTER — Ambulatory Visit (INDEPENDENT_AMBULATORY_CARE_PROVIDER_SITE_OTHER): Payer: Medicaid Other | Admitting: Adult Health

## 2015-08-23 VITALS — BP 128/82 | HR 98 | Ht 65.0 in | Wt 277.5 lb

## 2015-08-23 DIAGNOSIS — N920 Excessive and frequent menstruation with regular cycle: Secondary | ICD-10-CM | POA: Diagnosis not present

## 2015-08-23 DIAGNOSIS — Z3202 Encounter for pregnancy test, result negative: Secondary | ICD-10-CM

## 2015-08-23 DIAGNOSIS — R1032 Left lower quadrant pain: Secondary | ICD-10-CM

## 2015-08-23 LAB — POCT HEMOGLOBIN: Hemoglobin: 14.4 g/dL (ref 12.2–16.2)

## 2015-08-23 LAB — POCT URINE PREGNANCY: Preg Test, Ur: NEGATIVE

## 2015-08-23 NOTE — Patient Instructions (Signed)

## 2015-08-23 NOTE — Telephone Encounter (Signed)
Spoke with pt letting her know JAG needs to see her. Pt voiced understanding and call was transferred to front desk. [place]

## 2015-08-23 NOTE — Telephone Encounter (Signed)
Spoke with pt. Pt has been bleeding x 13 days, light and heavy. Pt has ovarian cysts and is having pain in left side. Pt is requesting something for bleeding. Please advise. Thanks!!! [employer]

## 2015-08-23 NOTE — Progress Notes (Signed)
Subjective:     Patient ID: [name], female   DOB: 05/10/1980, 35 y.o.   MRN: [id number]  HPI [name] is a 35 year old black female in complaining of bleeding for 13 days and is having clots and heavy at times and has pain in left side, she says period last month was normal.And she still would like to be pregnant.   Review of Systems Patient denies any headaches, hearing loss, fatigue, blurred vision, shortness of breath, chest pain, problems with bowel movements, urination, or intercourse. No joint pain or mood swings.See HPI for positives. Reviewed past medical,surgical, social and family history. Reviewed medications and allergies.     Objective:   Physical Exam BP 128/82 mmHg  Pulse 98  Ht 5\' 5"  (1.651 m)  Wt 277 lb 8 oz (125.873 kg)  BMI 46.18 kg/m2  LMP 08/11/2015 UPT negative, HGB 14.4  Skin warm and dry.Pelvic: external genitalia is normal in appearance no lesions, vagina: period like blood,urethra has no lesions or masses noted, cervix:smooth and bulbous, uterus: normal size, shape and contour, non tender, no masses felt, adnexa: no masses, LLQ tenderness noted. Bladder is non tender and no masses felt. Abdomen soft and non tender, obese, no HSM noted.   Will get [place] to assess uterus. Face time 15 minutes.  Assessment:     Menorrhagia  LLQ pain     Plan:     Return in am for gyn [place], will talk when results back Review handout on menorrhagia

## 2015-08-24 ENCOUNTER — Encounter: Payer: Self-pay | Admitting: Adult Health

## 2015-08-24 ENCOUNTER — Ambulatory Visit (INDEPENDENT_AMBULATORY_CARE_PROVIDER_SITE_OTHER): Payer: Medicaid Other

## 2015-08-24 ENCOUNTER — Telehealth: Payer: Self-pay | Admitting: Adult Health

## 2015-08-24 DIAGNOSIS — D251 Intramural leiomyoma of uterus: Secondary | ICD-10-CM

## 2015-08-24 DIAGNOSIS — N854 Malposition of uterus: Secondary | ICD-10-CM | POA: Diagnosis not present

## 2015-08-24 DIAGNOSIS — N920 Excessive and frequent menstruation with regular cycle: Secondary | ICD-10-CM | POA: Diagnosis not present

## 2015-08-24 DIAGNOSIS — R1032 Left lower quadrant pain: Secondary | ICD-10-CM

## 2015-08-24 HISTORY — DX: Intramural leiomyoma of uterus: D25.1

## 2015-08-24 MED ORDER — MEGESTROL ACETATE 40 MG PO TABS: ORAL_TABLET | ORAL | Status: DC

## 2015-08-24 NOTE — Telephone Encounter (Signed)
Pt aware of [place] and wil rx megace, use condoms, follow up prn

## 2015-08-24 NOTE — Progress Notes (Signed)
PELVIC US TA/TV: anteverted uterus with a 9 x 6 x 7 mm post mid uterus intramural fibroid, EEC 4.4 mm,normal ov's bilat (mobile),prominent dilated vessels in the left adnexa,lt adnexal pain during ultrasound,small amount of simple cul de sac fluid

## 2015-09-14 ENCOUNTER — Ambulatory Visit (INDEPENDENT_AMBULATORY_CARE_PROVIDER_SITE_OTHER): Payer: Medicaid Other | Admitting: Family Medicine

## 2015-09-14 ENCOUNTER — Encounter: Payer: Self-pay | Admitting: Family Medicine

## 2015-09-14 VITALS — BP 132/78 | HR 90 | Temp 98.8°F | Resp 14 | Ht 65.0 in | Wt 276.0 lb

## 2015-09-14 DIAGNOSIS — E669 Obesity, unspecified: Secondary | ICD-10-CM | POA: Diagnosis not present

## 2015-09-14 DIAGNOSIS — J301 Allergic rhinitis due to pollen: Secondary | ICD-10-CM | POA: Diagnosis not present

## 2015-09-14 DIAGNOSIS — J309 Allergic rhinitis, unspecified: Secondary | ICD-10-CM | POA: Insufficient documentation

## 2015-09-14 MED ORDER — LEVOCETIRIZINE DIHYDROCHLORIDE 5 MG PO TABS: 5.0000 mg | ORAL_TABLET | Freq: Every evening | ORAL | Status: DC

## 2015-09-14 MED ORDER — PHENTERMINE HCL 37.5 MG PO TABS: 37.5000 mg | ORAL_TABLET | Freq: Every day | ORAL | Status: DC

## 2015-09-14 NOTE — Patient Instructions (Addendum)
Start with 1/2 tablet for 1 week, then increase to 1 full tablet Keep with low carbs Continue flonase, new allergy pill xyzal F/U 3 months

## 2015-09-14 NOTE — Assessment & Plan Note (Signed)
Start phentermine today, discussed medications, directions SE, recheck in 3 months

## 2015-09-14 NOTE — Assessment & Plan Note (Signed)
No sign of bacterial sinusitis , continue flonase, change to xyzal,  Has tried claritin

## 2015-09-14 NOTE — Progress Notes (Signed)
Patient ID: [name], female   DOB: [birth year], 35 y.o.   MRN: [id number]    Subjective:    Patient ID: [name], female    DOB: 07/19/1980, 35 y.o.   MRN: [id number]  Patient presents for 2 month F/U and Illness Patient here to follow-up obesity. She was supose  phentermine 2 months ago her weight is down 6 pounds. She never started the medication because she not pick it up in time the pharmacy. Back. She would like to go ahead and proceed with it. She has been trying to walk more. She does state that she started Megace again because of dysfunctional uterine bleeding she has some fibroids and this tends to make her gain weight.  She has history of seasonal allergies she's had some mild clear drainage ears stuffiness runny nose she was taking Claritin but continues to have sneezing and the symptoms she added Flonase the past couple days and her symptoms have improved. No fever, no cough        Review Of Systems:  GEN- denies fatigue, fever, weight loss,weakness, recent illness HEENT- denies eye drainage, change in vision, +nasal discharge, CVS- denies chest pain, palpitations RESP- denies SOB, cough, wheeze ABD- denies N/V, change in stools, abd pain GU- denies dysuria, hematuria, dribbling, incontinence MSK- denies joint pain, muscle aches, injury Neuro- denies headache, dizziness, syncope, seizure activity       Objective:    BP 132/78 mmHg  Pulse 90  Temp(Src) 98.8 F (37.1 C) (Oral)  Resp 14  Ht 5\' 5"  (1.651 m)  Wt 276 lb (125.193 kg)  BMI 45.93 kg/m2  LMP 08/11/2015 GEN- NAD, alert and oriented x3 HEENT- PERRL, EOMI, non injected sclera, pink conjunctiva, MMM, oropharynx clear, nares clear rhinorrhea, ,no maxillary sinus tenderness  Neck- Supple, no LAD  CVS- RRR, no murmur RESP-CTAB Pulses- Radial  2+        Assessment & Plan:      Problem List Items Addressed This Visit    Obesity - Primary    Start phentermine today, discussed  medications, directions SE, recheck in 3 months       Relevant Medications   phentermine (ADIPEX-P) 37.5 MG tablet   Allergic rhinitis    No sign of bacterial sinusitis , continue flonase, change to xyzal,  Has tried claritin         Note: This dictation was prepared with Dragon dictation along with smaller phrase technology. Any transcriptional errors that result from this process are unintentional.

## 2015-09-29 ENCOUNTER — Other Ambulatory Visit: Payer: Self-pay | Admitting: Adult Health

## 2015-10-31 ENCOUNTER — Telehealth: Payer: Self-pay | Admitting: *Deleted

## 2015-10-31 MED ORDER — MEGESTROL ACETATE 40 MG PO TABS: ORAL_TABLET | ORAL | Status: DC

## 2015-10-31 NOTE — Telephone Encounter (Signed)
Spoke with pt. Pt is requesting a refill on Megace. Please advise. Thanks!! [place]

## 2015-10-31 NOTE — Telephone Encounter (Signed)
Will refill megace 

## 2015-12-16 ENCOUNTER — Ambulatory Visit (INDEPENDENT_AMBULATORY_CARE_PROVIDER_SITE_OTHER): Payer: Medicaid Other | Admitting: Family Medicine

## 2015-12-16 ENCOUNTER — Encounter: Payer: Self-pay | Admitting: Family Medicine

## 2015-12-16 VITALS — BP 138/92 | HR 78 | Temp 98.7°F | Resp 16 | Ht 65.0 in | Wt 270.0 lb

## 2015-12-16 DIAGNOSIS — H6121 Impacted cerumen, right ear: Secondary | ICD-10-CM

## 2015-12-16 DIAGNOSIS — J301 Allergic rhinitis due to pollen: Secondary | ICD-10-CM

## 2015-12-16 DIAGNOSIS — M722 Plantar fascial fibromatosis: Secondary | ICD-10-CM | POA: Diagnosis not present

## 2015-12-16 DIAGNOSIS — I10 Essential (primary) hypertension: Secondary | ICD-10-CM

## 2015-12-16 DIAGNOSIS — J4520 Mild intermittent asthma, uncomplicated: Secondary | ICD-10-CM

## 2015-12-16 MED ORDER — METHYLPREDNISOLONE ACETATE 40 MG/ML IJ SUSP
40.0000 mg | Freq: Once | INTRAMUSCULAR | Status: AC
  Administered 2015-12-16: 40 mg via INTRAMUSCULAR

## 2015-12-16 MED ORDER — MELOXICAM 15 MG PO TABS: 15.0000 mg | ORAL_TABLET | Freq: Every day | ORAL | 0 refills | Status: DC

## 2015-12-16 MED ORDER — PREDNISONE 20 MG PO TABS: 40.0000 mg | ORAL_TABLET | Freq: Every day | ORAL | 0 refills | Status: DC

## 2015-12-16 MED ORDER — ALBUTEROL SULFATE HFA 108 (90 BASE) MCG/ACT IN AERS: 2.0000 | INHALATION_SPRAY | RESPIRATORY_TRACT | 2 refills | Status: DC | PRN

## 2015-12-16 NOTE — Progress Notes (Signed)
   Subjective:    Patient ID: [name], female    DOB: February 01, 1981, 35 y.o.   MRN: [id number]  Patient presents for Follow-up (C/o nasal congestion/facial pain x 2 days )  Pt here for f/u obesity. Last visit started on phentermine , weight was 276lbs, she took for 1 month, then stated it made her jittery and feel bad so she discontinued.  Also states she has had bilat foot pain past 2-3 weeks  wosre when she steps down in the morning and if she is up on her feet, took ibuprofen a few times, no injury no swelling. Has not been exercising due to pain  Feels like ears are clogged up ,changed to Xyzal for allergies last visit still gets some congestion    Currently on megace, planning to have treatment for fibroids   Had asthma attack last week, using albuterol, every 4 hours doesn't feel like it has broken yet. No fever.      Review Of Systems:  GEN- denies fatigue, fever, weight loss,weakness, recent illness HEENT- denies eye drainage, change in vision, nasal discharge, CVS- denies chest pain, palpitations RESP- denies SOB, +cough, +wheeze ABD- denies N/V, change in stools, abd pain GU- denies dysuria, hematuria, dribbling, incontinence MSK- denies joint pain, muscle aches, injury Neuro- denies headache, dizziness, syncope, seizure activity       Objective:    BP (!) 138/92   Pulse 78   Temp 98.7 F (37.1 C) (Oral)   Resp 16   Ht 5\' 5"  (1.651 m)   Wt 270 lb (122.5 kg)   SpO2 98%   BMI 44.93 kg/m  GEN- NAD, alert and oriented x3,down 6lbs  HEENT- PERRL, EOMI, non injected sclera, pink conjunctiva, MMM, oropharynx clear, Right canal impacted with wax, left TM clear, canal clear, no maxillary sinus tenderness  Neck- Supple, no LAD  CVS- RRR, no murmur RESP-CTAB EXT- No edema, mild TTP near plantar fascia insertion bilat also at metarsal arch, flat foot  Pulses- Radial, DP- 2+        Assessment & Plan:      Problem List Items Addressed This Visit    Plantar fasciitis    Given mobic, advised ICE to area, given exercises  Podiatry if not improved       Hypertension    Continue to work on diet and weight BP up a little, continue HCTZ       Asthma in adult    Recent exacerbation she still feels tight, will give Depo Medrol 40mg  And give prednisone for 5 days       Relevant Medications   predniSONE (DELTASONE) 20 MG tablet   albuterol (PROVENTIL HFA;VENTOLIN HFA) 108 (90 Base) MCG/ACT inhaler   methylPREDNISolone acetate (DEPO-MEDROL) injection 40 mg (Completed)   Allergic rhinitis    Continue allergy medication       Other Visit Diagnoses    Cerumen impaction, right    -  Primary   s/p irrigation at bedside , no sign of infection      Note: This dictation was prepared with Dragon dictation along with smaller phrase technology. Any transcriptional errors that result from this process are unintentional.

## 2015-12-16 NOTE — Patient Instructions (Addendum)
Keep working on exercise and diet  Start prednisone tomorrow Wait until prednisone is done before you take mobic for your foot ICE foot and stretch, if no improvement referral to foot doctor F/U 4 months FASTING  Plantar Fasciitis With Rehab The plantar fascia is a fibrous, ligament-like, soft-tissue structure that spans the bottom of the foot. Plantar fasciitis, also called heel spur syndrome, is a condition that causes pain in the foot due to inflammation of the tissue. SYMPTOMS   Pain and tenderness on the underneath side of the foot.  Pain that worsens with standing or walking. CAUSES  Plantar fasciitis is caused by irritation and injury to the plantar fascia on the underneath side of the foot. Common mechanisms of injury include:  Direct trauma to bottom of the foot.  Damage to a small nerve that runs under the foot where the main fascia attaches to the heel bone.  Stress placed on the plantar fascia due to bone spurs. RISK INCREASES WITH:   Activities that place stress on the plantar fascia (running, jumping, pivoting, or cutting).  Poor strength and flexibility.  Improperly fitted shoes.  Tight calf muscles.  Flat feet.  Failure to warm-up properly before activity.  Obesity. PREVENTION  Warm up and stretch properly before activity.  Allow for adequate recovery between workouts.  Maintain physical fitness:  Strength, flexibility, and endurance.  Cardiovascular fitness.  Maintain a health body weight.  Avoid stress on the plantar fascia.  Wear properly fitted shoes, including arch supports for individuals who have flat feet. PROGNOSIS  If treated properly, then the symptoms of plantar fasciitis usually resolve without surgery. However, occasionally surgery is necessary. RELATED COMPLICATIONS   Recurrent symptoms that may result in a chronic condition.  Problems of the lower back that are caused by compensating for the injury, such as limping.  Pain or  weakness of the foot during push-off following surgery.  Chronic inflammation, scarring, and partial or complete fascia tear, occurring more often from repeated injections. TREATMENT  Treatment initially involves the use of ice and medication to help reduce pain and inflammation. The use of strengthening and stretching exercises may help reduce pain with activity, especially stretches of the Achilles tendon. These exercises may be performed at home or with a therapist. Your caregiver may recommend that you use heel cups of arch supports to help reduce stress on the plantar fascia. Occasionally, corticosteroid injections are given to reduce inflammation. If symptoms persist for greater than 6 months despite non-surgical (conservative), then surgery may be recommended.  MEDICATION   If pain medication is necessary, then nonsteroidal anti-inflammatory medications, such as aspirin and ibuprofen, or other minor pain relievers, such as acetaminophen, are often recommended.  Do not take pain medication within 7 days before surgery.  Prescription pain relievers may be given if deemed necessary by your caregiver. Use only as directed and only as much as you need.  Corticosteroid injections may be given by your caregiver. These injections should be reserved for the most serious cases, because they may only be given a certain number of times. HEAT AND COLD  Cold treatment (icing) relieves pain and reduces inflammation. Cold treatment should be applied for 10 to 15 minutes every 2 to 3 hours for inflammation and pain and immediately after any activity that aggravates your symptoms. Use ice packs or massage the area with a piece of ice (ice massage).  Heat treatment may be used prior to performing the stretching and strengthening activities prescribed by your caregiver, physical  therapist, or athletic trainer. Use a heat pack or soak the injury in warm water. SEEK IMMEDIATE MEDICAL CARE IF:  Treatment seems  to offer no benefit, or the condition worsens.  Any medications produce adverse side effects. EXERCISES RANGE OF MOTION (ROM) AND STRETCHING EXERCISES - Plantar Fasciitis (Heel Spur Syndrome) These exercises may help you when beginning to rehabilitate your injury. Your symptoms may resolve with or without further involvement from your physician, physical therapist or athletic trainer. While completing these exercises, remember:   Restoring tissue flexibility helps normal motion to return to the joints. This allows healthier, less painful movement and activity.  An effective stretch should be held for at least 30 seconds.  A stretch should never be painful. You should only feel a gentle lengthening or release in the stretched tissue. RANGE OF MOTION - Toe Extension, Flexion  Sit with your right / left leg crossed over your opposite knee.  Grasp your toes and gently pull them back toward the top of your foot. You should feel a stretch on the bottom of your toes and/or foot.  Hold this stretch for __________ seconds.  Now, gently pull your toes toward the bottom of your foot. You should feel a stretch on the top of your toes and or foot.  Hold this stretch for __________ seconds. Repeat __________ times. Complete this stretch __________ times per day.  RANGE OF MOTION - Ankle Dorsiflexion, Active Assisted  Remove shoes and sit on a chair that is preferably not on a carpeted surface.  Place right / left foot under knee. Extend your opposite leg for support.  Keeping your heel down, slide your right / left foot back toward the chair until you feel a stretch at your ankle or calf. If you do not feel a stretch, slide your bottom forward to the edge of the chair, while still keeping your heel down.  Hold this stretch for __________ seconds. Repeat __________ times. Complete this stretch __________ times per day.  STRETCH - Gastroc, Standing  Place hands on wall.  Extend right / left  leg, keeping the front knee somewhat bent.  Slightly point your toes inward on your back foot.  Keeping your right / left heel on the floor and your knee straight, shift your weight toward the wall, not allowing your back to arch.  You should feel a gentle stretch in the right / left calf. Hold this position for __________ seconds. Repeat __________ times. Complete this stretch __________ times per day. STRETCH - Soleus, Standing  Place hands on wall.  Extend right / left leg, keeping the other knee somewhat bent.  Slightly point your toes inward on your back foot.  Keep your right / left heel on the floor, bend your back knee, and slightly shift your weight over the back leg so that you feel a gentle stretch deep in your back calf.  Hold this position for __________ seconds. Repeat __________ times. Complete this stretch __________ times per day. STRETCH - Gastrocsoleus, Standing  Note: This exercise can place a lot of stress on your foot and ankle. Please complete this exercise only if specifically instructed by your caregiver.   Place the ball of your right / left foot on a step, keeping your other foot firmly on the same step.  Hold on to the wall or a rail for balance.  Slowly lift your other foot, allowing your body weight to press your heel down over the edge of the step.  You should  feel a stretch in your right / left calf.  Hold this position for __________ seconds.  Repeat this exercise with a slight bend in your right / left knee. Repeat __________ times. Complete this stretch __________ times per day.  STRENGTHENING EXERCISES - Plantar Fasciitis (Heel Spur Syndrome)  These exercises may help you when beginning to rehabilitate your injury. They may resolve your symptoms with or without further involvement from your physician, physical therapist or athletic trainer. While completing these exercises, remember:   Muscles can gain both the endurance and the strength  needed for everyday activities through controlled exercises.  Complete these exercises as instructed by your physician, physical therapist or athletic trainer. Progress the resistance and repetitions only as guided. STRENGTH - Towel Curls  Sit in a chair positioned on a non-carpeted surface.  Place your foot on a towel, keeping your heel on the floor.  Pull the towel toward your heel by only curling your toes. Keep your heel on the floor.  If instructed by your physician, physical therapist or athletic trainer, add ____________________ at the end of the towel. Repeat __________ times. Complete this exercise __________ times per day. STRENGTH - Ankle Inversion  Secure one end of a rubber exercise band/tubing to a fixed object (table, pole). Loop the other end around your foot just before your toes.  Place your fists between your knees. This will focus your strengthening at your ankle.  Slowly, pull your big toe up and in, making sure the band/tubing is positioned to resist the entire motion.  Hold this position for __________ seconds.  Have your muscles resist the band/tubing as it slowly pulls your foot back to the starting position. Repeat __________ times. Complete this exercises __________ times per day.    This information is not intended to replace advice given to you by your health care provider. Make sure you discuss any questions you have with your health care provider.   Document Released: 04/16/2005 Document Revised: 08/31/2014 Document Reviewed: 07/29/2008 Elsevier Interactive Patient Education [employer].

## 2015-12-16 NOTE — Assessment & Plan Note (Signed)
Continue to work on diet and weight BP up a little, continue HCTZ

## 2015-12-16 NOTE — Assessment & Plan Note (Signed)
Recent exacerbation she still feels tight, will give Depo Medrol 40mg  And give prednisone for 5 days

## 2015-12-18 ENCOUNTER — Encounter: Payer: Self-pay | Admitting: Family Medicine

## 2015-12-18 NOTE — Assessment & Plan Note (Signed)
Given mobic, advised ICE to area, given exercises  Podiatry if not improved

## 2015-12-18 NOTE — Assessment & Plan Note (Signed)
Continue allergy medication.

## 2016-02-20 ENCOUNTER — Ambulatory Visit (INDEPENDENT_AMBULATORY_CARE_PROVIDER_SITE_OTHER): Payer: Medicaid Other | Admitting: Physician Assistant

## 2016-02-20 ENCOUNTER — Encounter: Payer: Self-pay | Admitting: Physician Assistant

## 2016-02-20 VITALS — BP 138/86 | HR 100 | Temp 98.6°F | Resp 18 | Wt 271.0 lb

## 2016-02-20 DIAGNOSIS — M542 Cervicalgia: Secondary | ICD-10-CM

## 2016-02-20 DIAGNOSIS — J4520 Mild intermittent asthma, uncomplicated: Secondary | ICD-10-CM

## 2016-02-20 MED ORDER — PREDNISONE 20 MG PO TABS: 20.0000 mg | ORAL_TABLET | Freq: Every day | ORAL | 0 refills | Status: DC

## 2016-02-20 MED ORDER — CYCLOBENZAPRINE HCL 10 MG PO TABS: 10.0000 mg | ORAL_TABLET | Freq: Three times a day (TID) | ORAL | 0 refills | Status: DC | PRN

## 2016-02-20 NOTE — Progress Notes (Signed)
Patient ID: RASHANDA EQUIHUA MRN: [id number], DOB: Apr 20, 1981, 35 y.o. Date of Encounter: 02/20/2016, 2:55 PM    Chief Complaint:  Chief Complaint  Patient presents with  . Shortness of Breath, shoulder pain left arm and left side of     HPI: 35 y.o. year old female presents with above.   Says that she is living in a different place than she was living last year. Says it is an older house and has gas heat. Says she doesn't know whether the heat is what is affecting/causing this asthma exacerbation or not. says Saturday morning at 5 AM she noted that she was wheezing and used her albuterol inhaler. Says that that morning at 8 AM she used the nebulizer. Since then has used the inhaler some. Used it twice yesterday. Has only needed it once today and that was when she was walking up some stairs. Says that she usually does not have shortness of breath when going up the stairs. States that she has had no phlegm from her chest. Has had no nasal congestion or mucus from her nose. No fevers or chills. No sore throat.  Says that for a couple of months now she has been having tightness and discomfort in her left neck and also the anterior aspect of her left shoulder. Says it seemed to just start one day after she had been sleeping. 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 No other activity that she can think of that would be affecting this region.     Home Meds:   Outpatient Medications Prior to Visit  Medication Sig Dispense Refill  . albuterol (PROVENTIL HFA;VENTOLIN HFA) 108 (90 Base) MCG/ACT inhaler Inhale 2 puffs into the lungs every 4 (four) hours as needed. 6.7 g 2  . beclomethasone (QVAR) 80 MCG/ACT inhaler Inhale 2 puffs into the lungs 2 (two) times daily. 1 Inhaler 12  . fluticasone (FLONASE) 50 MCG/ACT nasal spray Place 2  sprays into both nostrils daily. 16 g 1  . hydrochlorothiazide (HYDRODIURIL) 25 MG tablet Take 1 tablet (25 mg total) by mouth daily. 30 tablet 6  . ibuprofen (ADVIL,MOTRIN) 800 MG tablet Take 1 tablet (800 mg total) by mouth every 8 (eight) hours as needed. 30 tablet 1  . levocetirizine (XYZAL) 5 MG tablet Take 1 tablet (5 mg total) by mouth every evening. 30 tablet 6  . megestrol (MEGACE) 40 MG tablet TAKE 3 TABLETS BY MOUTH X 5 DAYS, THEN 2 TABS X 5 DAYS, THEN 1 TAB DAILY UNTIL BLEEDING STOPS. 45 tablet 1  . meloxicam (MOBIC) 15 MG tablet Take 1 tablet (15 mg total) by mouth daily. 30 tablet 0  . potassium chloride (K-DUR) 10 MEQ tablet Take 1 tablet (10 mEq total) by mouth daily. 30 tablet 5  . predniSONE (DELTASONE) 20 MG tablet Take 2 tablets (40 mg total) by mouth daily with breakfast. For 5 days 10 tablet 0  . zolpidem (AMBIEN) 10 MG tablet Take 1 tablet (10 mg total) by mouth at bedtime as needed for sleep. 15 tablet 2   No facility-administered medications prior to visit.     Allergies:  Allergies  Allergen Reactions  . Banana Rash  . Pineapple Rash      Review of Systems: See HPI for pertinent ROS. All other ROS negative.    Physical  Exam: Blood pressure 138/86, pulse 100, temperature 98.6 F (37 C), temperature source Oral, resp. rate 18, weight 271 lb (122.9 kg), SpO2 98 %., Body mass index is 45.1 kg/m. General:  Obese AAF. Appears in no acute distress. HEENT: Normocephalic, atraumatic, eyes without discharge, sclera non-icteric, nares are without discharge. Bilateral auditory canals clear, TM's are without perforation, pearly grey and translucent with reflective cone of light bilaterally. Oral cavity moist, posterior pharynx without exudate, erythema, peritonsillar abscess.  Neck: Supple. No thyromegaly. No lymphadenopathy. Lungs: Clear bilaterally to auscultation without wheezes, rales, or rhonchi. Breathing is unlabored. Sounds are distant secondary to her body  habitus/obesity but I hear no wheezes. Lung sounds are clear. Oxygen saturation is 98% on room air. Heart: Regular rhythm. No murmurs, rubs, or gallops. Msk:  Strength and tone normal for age. Left Neck: There is severe tenderness with palpation of left neck and anterior aspect of left shoulder.  Left shoulder: There is severe tenderness with palpation of the anterior aspect of the left shoulder. I had her try to extend her left arm forward she could not bring it up to a 90 angle secondary to pain up towards the left neck and anterior aspect of left shoulder. As well when I had her attempt to abduct the left shoulder--this was significantly limited.  Forearm strength with adduction and abduction is 5/5. Extremities/Skin: Warm and dry. Neuro: Alert and oriented X 3. Moves all extremities spontaneously. Gait is normal. CNII-XII grossly in tact. Psych:  Responds to questions appropriately with a normal affect.     ASSESSMENT AND PLAN:  35 y.o. year old female with  1. Mild intermittent asthma in adult without complication Verifed that she has been using the Qvar 2 puffs twice a day. She is to continue this. She is to also take the prednisone 20 mg daily for 5 days. She is to continue the albuterol every 4 hours as needed. If her breathing worsens then follow-up immediately. Otherwise if breathing does not return to normal baseline in 1 week in follow-up. - predniSONE (DELTASONE) 20 MG tablet; Take 1 tablet (20 mg total) by mouth daily with breakfast.  Dispense: 5 tablet; Refill: 0  2. Neck pain She is to apply heat to the area using heating pad or warm water in the shower. She is to perform range of motion to the neck and the left shoulder throughout the day. Cautioned her that the Flexeril may cause drowsiness and if it does then only use this at night. Otherwise can use this 3 times a day if there is no drowsiness. F/U if this is not improved in 2 weeks. - cyclobenzaprine (FLEXERIL) 10 MG  tablet; Take 1 tablet (10 mg total) by mouth 3 (three) times daily as needed for muscle spasms.  Dispense: 30 tablet; Refill: 0   Signed, [address] [place], [place], [facility] 02/20/2016 2:55 PM

## 2016-02-23 ENCOUNTER — Telehealth: Payer: Self-pay | Admitting: Adult Health

## 2016-02-23 MED ORDER — MEGESTROL ACETATE 40 MG PO TABS: ORAL_TABLET | ORAL | 1 refills | Status: DC

## 2016-02-23 NOTE — Telephone Encounter (Signed)
Will rx megace 

## 2016-02-23 NOTE — Telephone Encounter (Signed)
Spoke with pt. Pt is having heavy periods and she took her last Megace Tuesday. Can you refill Megace? Thanks!! [place]

## 2016-02-27 ENCOUNTER — Other Ambulatory Visit: Payer: Self-pay | Admitting: Family Medicine

## 2016-02-27 NOTE — Telephone Encounter (Signed)
Ok to refill??  Last office visit 02/20/2016.  Last refill 05/06/2015, #2 refills.

## 2016-02-27 NOTE — Telephone Encounter (Signed)
okay

## 2016-02-28 NOTE — Telephone Encounter (Signed)
Medication called to pharmacy. 

## 2016-04-16 ENCOUNTER — Ambulatory Visit: Payer: Medicaid Other | Admitting: Family Medicine

## 2016-05-07 ENCOUNTER — Encounter (HOSPITAL_COMMUNITY): Payer: Self-pay | Admitting: *Deleted

## 2016-05-07 DIAGNOSIS — R109 Unspecified abdominal pain: Secondary | ICD-10-CM | POA: Insufficient documentation

## 2016-05-07 DIAGNOSIS — J45909 Unspecified asthma, uncomplicated: Secondary | ICD-10-CM | POA: Insufficient documentation

## 2016-05-07 DIAGNOSIS — N920 Excessive and frequent menstruation with regular cycle: Secondary | ICD-10-CM | POA: Insufficient documentation

## 2016-05-07 NOTE — ED Triage Notes (Signed)
Vaginal bleeding, right shoulder pain, right lower quadrant pain onset today. States she takes megace for bleeding

## 2016-05-08 ENCOUNTER — Emergency Department (HOSPITAL_COMMUNITY): Payer: Self-pay

## 2016-05-08 ENCOUNTER — Emergency Department (HOSPITAL_COMMUNITY)
Admission: EM | Admit: 2016-05-08 | Discharge: 2016-05-08 | Disposition: A | Discharge status: Home / Self Care | Payer: Self-pay | Attending: Emergency Medicine | Admitting: Emergency Medicine

## 2016-05-08 DIAGNOSIS — R109 Unspecified abdominal pain: Secondary | ICD-10-CM

## 2016-05-08 DIAGNOSIS — N920 Excessive and frequent menstruation with regular cycle: Secondary | ICD-10-CM

## 2016-05-08 LAB — CBC WITH DIFFERENTIAL/PLATELET
Basophils Absolute: 0 10*3/uL (ref 0.0–0.1)
Basophils Relative: 0 %
Eosinophils Absolute: 0.2 10*3/uL (ref 0.0–0.7)
Eosinophils Relative: 1 %
HEMATOCRIT: 37.4 % (ref 36.0–46.0)
HEMOGLOBIN: 12.8 g/dL (ref 12.0–15.0)
LYMPHS ABS: 1.2 10*3/uL (ref 0.7–4.0)
LYMPHS PCT: 6 %
MCH: 30.8 pg (ref 26.0–34.0)
MCHC: 34.2 g/dL (ref 30.0–36.0)
MCV: 90.1 fL (ref 78.0–100.0)
MONO ABS: 1.1 10*3/uL — AB (ref 0.1–1.0)
MONOS PCT: 6 %
NEUTROS PCT: 87 %
Neutro Abs: 16.3 10*3/uL — ABNORMAL HIGH (ref 1.7–7.7)
Platelets: 338 10*3/uL (ref 150–400)
RBC: 4.15 MIL/uL (ref 3.87–5.11)
RDW: 13.2 % (ref 11.5–15.5)
WBC: 18.8 10*3/uL — ABNORMAL HIGH (ref 4.0–10.5)

## 2016-05-08 LAB — I-STAT BETA HCG BLOOD, ED (MC, WL, AP ONLY): I-stat hCG, quantitative: <5 m[IU]/mL (ref <5)

## 2016-05-08 LAB — URINALYSIS, MICROSCOPIC (REFLEX)

## 2016-05-08 LAB — COMPREHENSIVE METABOLIC PANEL
ALBUMIN: 3.6 g/dL (ref 3.5–5.0)
ALT: 9 U/L — ABNORMAL LOW (ref 14–54)
AST: 13 U/L — AB (ref 15–41)
Alkaline Phosphatase: 66 U/L (ref 38–126)
Anion gap: 6 (ref 5–15)
BUN: 7 mg/dL (ref 6–20)
CO2: 25 mmol/L (ref 22–32)
Calcium: 8.7 mg/dL — ABNORMAL LOW (ref 8.9–10.3)
Chloride: 109 mmol/L (ref 101–111)
Creatinine, Ser: 0.78 mg/dL (ref 0.44–1.00)
GFR calc Af Amer: >60 mL/min (ref >60)
GFR calc non Af Amer: >60 mL/min (ref >60)
GLUCOSE: 151 mg/dL — AB (ref 65–99)
Potassium: 3.1 mmol/L — ABNORMAL LOW (ref 3.5–5.1)
SODIUM: 140 mmol/L (ref 135–145)
Total Bilirubin: 0.7 mg/dL (ref 0.3–1.2)
Total Protein: 7.6 g/dL (ref 6.5–8.1)

## 2016-05-08 LAB — URINALYSIS, ROUTINE W REFLEX MICROSCOPIC
Bilirubin Urine: NEGATIVE
GLUCOSE, UA: NEGATIVE mg/dL
Ketones, ur: NEGATIVE mg/dL
LEUKOCYTES UA: NEGATIVE
Nitrite: NEGATIVE
PROTEIN: NEGATIVE mg/dL
SPECIFIC GRAVITY, URINE: 1.01 (ref 1.005–1.030)
pH: 7.5 (ref 5.0–8.0)

## 2016-05-08 LAB — LIPASE, BLOOD: Lipase: 14 U/L (ref 11–51)

## 2016-05-08 MED ORDER — ONDANSETRON HCL 4 MG/2ML IJ SOLN
4.0000 mg | Freq: Once | INTRAMUSCULAR | Status: AC
  Administered 2016-05-08: 4 mg via INTRAVENOUS
  Filled 2016-05-08: qty 2

## 2016-05-08 MED ORDER — NAPROXEN 500 MG PO TABS: 500.0000 mg | ORAL_TABLET | Freq: Two times a day (BID) | ORAL | 0 refills | Status: DC

## 2016-05-08 MED ORDER — SODIUM CHLORIDE 0.9 % IV BOLUS (SEPSIS)
1000.0000 mL | Freq: Once | INTRAVENOUS | Status: AC
  Administered 2016-05-08: 1000 mL via INTRAVENOUS

## 2016-05-08 MED ORDER — KETOROLAC TROMETHAMINE 30 MG/ML IJ SOLN
30.0000 mg | Freq: Once | INTRAMUSCULAR | Status: AC
  Administered 2016-05-08: 30 mg via INTRAVENOUS
  Filled 2016-05-08: qty 1

## 2016-05-08 MED ORDER — IOPAMIDOL (ISOVUE-300) INJECTION 61%
100.0000 mL | Freq: Once | INTRAVENOUS | Status: AC | PRN
  Administered 2016-05-08: 100 mL via INTRAVENOUS

## 2016-05-08 MED ORDER — FENTANYL CITRATE (PF) 100 MCG/2ML IJ SOLN
50.0000 ug | Freq: Once | INTRAMUSCULAR | Status: AC
  Administered 2016-05-08: 50 ug via INTRAVENOUS
  Filled 2016-05-08: qty 2

## 2016-05-08 NOTE — Discharge Instructions (Signed)
Your tests tonight are good, you aren't anemic from the vaginal bleeding, the CT scan doesn't show any inflammation of the intestines, gallbladder or appendix.  Take the naproxen for pain. Please follow up with [facility] about your heavy vaginal bleeding.

## 2016-05-08 NOTE — ED Provider Notes (Signed)
[facility] DEPT Provider Note   CSN: [id number] Arrival date & time: 05/07/16  2259   Time seen 01:30 AM  History   Chief Complaint Chief Complaint  Patient presents with  . Vaginal Bleeding    HPI LEAIRAH [name] is a 36 y.o. female.  HPI  patient reports she has had a soreness in her right-sided abdomen past 3 days however got worse today and now it is described as sharp. She states it's worse with movement and feels better if she lays still. She states the pain is been there constantly. She states she's never had it before. Patient has a long history of menorrhagia and states her periods started 1 week ago and it continues to be heavy. States she changes her pad every 15-20 minutes for the past week. She normally takes Megace which she's been taking for the past 2-3 years however has not helped this time. She states she had some clots yesterday. She states today she feels dizzy and weak when she stands up or she walks around. She denies any fever, cough, sore throat, rhinorrhea. She denies nausea or vomiting. She denies diarrhea.  PCP [name], MD [facility]  Past Medical History:  Diagnosis Date  . Allergy    banana/pineapple  . Anemia    post pregnancy  . Anxiety   . Asthma   . Autoimmune disease, not elsewhere classified(279.49) 2013   Non specific- [facility], abnormal Bone Marrow signal  . Breast lump in female 11/28/2012   Has tender round mass at 10-11 0'clock right breast 4 finger breaths from [place] will [place]  . Breast mass, right 08/18/2012  . Chronic breast pain 05/31/2015  . Dysmenorrhea 03/23/2014  . Dyspareunia 01/05/2014  . Fibroids, intramural 08/24/2015  . GERD (gastroesophageal reflux disease)   . Headache(784.0)   . History of abnormal cervical Pap smear 04/02/2014  . Hypertension   . LLQ abdominal tenderness 05/31/2015  . Menorrhagia 01/05/2014  . Neuromuscular disorder ([place])    lower back and bilat leg pain  . Obesity   . Other  and unspecified ovarian cyst 01/12/2014  . Polyclonal gammopathy 07/26/2013   Insignificant  . Scoliosis   . Vaginal irritation 09/30/2014  . Vaginal Pap smear, abnormal     Patient Active Problem List   Diagnosis Date Noted  . Plantar fasciitis 12/16/2015  . Allergic rhinitis 09/14/2015  . Fibroids, intramural 08/24/2015  . LLQ abdominal tenderness 05/31/2015  . Chronic breast pain 05/31/2015  . Glucose intolerance (impaired glucose tolerance) 03/16/2015  . Knee sprain 03/16/2015  . Acute sinusitis 01/04/2015  . Vaginal irritation 09/30/2014  . History of abnormal cervical Pap smear 04/02/2014  . Dysmenorrhea 03/23/2014  . Other and unspecified ovarian cyst 01/12/2014  . Menorrhagia 01/05/2014  . Dyspareunia 01/05/2014  . Polyclonal gammopathy 07/26/2013  . Dysplasia of cervix, unspecified 03/31/2013  . DUB (dysfunctional uterine bleeding) 03/10/2013  . Breast lump in female 11/28/2012  . Heartburn 04/18/2012  . Insomnia 12/09/2011  . Asthma in adult 04/18/2011  . Hypertension 09/21/2010  . FATIGUE 12/13/2009  . HEADACHE 01/18/2009  . Obesity 11/03/2007  . Generalized anxiety disorder 11/03/2007  . Chronic low back pain 11/03/2007    Past Surgical History:  Procedure Laterality Date  . DILATION AND CURETTAGE OF UTERUS    . TONSILLECTOMY      OB History    Gravida Para Term Preterm AB Living   2 1     1 1    SAB TAB Ectopic  Multiple Live Births   1       1       Home Medications    Prior to Admission medications   Medication Sig Start Date End Date Taking? Authorizing Provider  albuterol (PROVENTIL HFA;VENTOLIN HFA) 108 (90 Base) MCG/ACT inhaler Inhale 2 puffs into the lungs every 4 (four) hours as needed. 12/16/15   Alycia Rossetti, MD  beclomethasone (QVAR) 80 MCG/ACT inhaler Inhale 2 puffs into the lungs 2 (two) times daily. 03/16/15   Alycia Rossetti, MD  cyclobenzaprine (FLEXERIL) 10 MG tablet Take 1 tablet (10 mg total) by mouth 3 (three) times daily as  needed for muscle spasms. 02/20/16   Lonie Peak Dixon, PA-C  fluticasone (FLONASE) 50 MCG/ACT nasal spray Place 2 sprays into both nostrils daily. 06/21/14   Alycia Rossetti, MD  hydrochlorothiazide (HYDRODIURIL) 25 MG tablet Take 1 tablet (25 mg total) by mouth daily. 05/06/15   Alycia Rossetti, MD  ibuprofen (ADVIL,MOTRIN) 800 MG tablet Take 1 tablet (800 mg total) by mouth every 8 (eight) hours as needed. 06/21/14   Alycia Rossetti, MD  levocetirizine (XYZAL) 5 MG tablet Take 1 tablet (5 mg total) by mouth every evening. 09/14/15   Alycia Rossetti, MD  megestrol (MEGACE) 40 MG tablet TAKE 3 TABLETS BY MOUTH X 5 DAYS, THEN 2 TABS X 5 DAYS, THEN 1 TAB DAILY UNTIL BLEEDING STOPS. 02/23/16   Estill Dooms, NP  meloxicam (MOBIC) 15 MG tablet Take 1 tablet (15 mg total) by mouth daily. 12/16/15   Alycia Rossetti, MD  naproxen (NAPROSYN) 500 MG tablet Take 1 tablet (500 mg total) by mouth 2 (two) times daily. 05/08/16   Rolland Porter, MD  potassium chloride (K-DUR) 10 MEQ tablet Take 1 tablet (10 mEq total) by mouth daily. 10/13/13   Alycia Rossetti, MD  predniSONE (DELTASONE) 20 MG tablet Take 2 tablets (40 mg total) by mouth daily with breakfast. For 5 days 12/16/15   Alycia Rossetti, MD  predniSONE (DELTASONE) 20 MG tablet Take 1 tablet (20 mg total) by mouth daily with breakfast. 02/20/16   Orlena Sheldon, PA-C  zolpidem (AMBIEN) 10 MG tablet TAKE (1) TABLET BY MOUTH AT BEDTIME. 02/28/16   Alycia Rossetti, MD    Family History Family History  Problem Relation Age of Onset  . Hypertension Mother   . Diabetes Mother   . Heart disease Mother   . Diabetes Father   . Cancer Paternal Aunt     breast  . Hypertension Maternal Grandmother   . Diabetes Son     pre diabetic  . Other Son     overactive bladder    Social History Social History  Substance Use Topics  . Smoking status: Never Smoker  . Smokeless tobacco: Never Used  . Alcohol use No  employed   Allergies   Banana and  Pineapple   Review of Systems Review of Systems  All other systems reviewed and are negative.    Physical Exam Updated Vital Signs BP 157/94 (BP Location: Right Arm)   Pulse 112   Temp 99.7 F (37.6 C) (Temporal)   Resp 20   Ht 5\' 5"  (1.651 m)   Wt 260 lb (117.9 kg)   LMP 04/30/2016   SpO2 99%   BMI 43.27 kg/m   Vital signs normal    Physical Exam  Constitutional: She is oriented to person, place, and time. She appears well-developed and well-nourished.  Non-toxic appearance. She  does not appear ill. No distress.  HENT:  Head: Normocephalic and atraumatic.  Right Ear: External ear normal.  Left Ear: External ear normal.  Nose: Nose normal. No mucosal edema or rhinorrhea.  Mouth/Throat: Oropharynx is clear and moist and mucous membranes are normal. No dental abscesses or uvula swelling.  Eyes: Conjunctivae and EOM are normal. Pupils are equal, round, and reactive to light.  Neck: Normal range of motion and full passive range of motion without pain. Neck supple.  Cardiovascular: Normal rate, regular rhythm and normal heart sounds.  Exam reveals no gallop and no friction rub.   No murmur heard. Pulmonary/Chest: Effort normal and breath sounds normal. No respiratory distress. She has no wheezes. She has no rhonchi. She has no rales. She exhibits no tenderness and no crepitus.  Abdominal: Soft. Normal appearance and bowel sounds are normal. She exhibits no distension. There is tenderness. There is no rebound and no guarding.    Patient has diffuse tenderness of her right abdomen. It seems equally tender in the right upper quadrant and right lower quadrant. She does not have any CVA tenderness tonight.  Musculoskeletal: Normal range of motion. She exhibits no edema or tenderness.  Moves all extremities well.   Neurological: She is alert and oriented to person, place, and time. She has normal strength. No cranial nerve deficit.  Skin: Skin is warm, dry and intact. No rash  noted. No erythema. No pallor.  Psychiatric: She has a normal mood and affect. Her speech is normal and behavior is normal. Her mood appears not anxious.  Nursing note and vitals reviewed.    ED Treatments / Results  Labs (all labs ordered are listed, but only abnormal results are displayed) Results for orders placed or performed during the hospital encounter of 05/08/16  Comprehensive metabolic panel  Result Value Ref Range   Sodium 140 135 - 145 mmol/L   Potassium 3.1 (L) 3.5 - 5.1 mmol/L   Chloride 109 101 - 111 mmol/L   CO2 25 22 - 32 mmol/L   Glucose, Bld 151 (H) 65 - 99 mg/dL   BUN 7 6 - 20 mg/dL   Creatinine, Ser 0.78 0.44 - 1.00 mg/dL   Calcium 8.7 (L) 8.9 - 10.3 mg/dL   Total Protein 7.6 6.5 - 8.1 g/dL   Albumin 3.6 3.5 - 5.0 g/dL   AST 13 (L) 15 - 41 U/L   ALT 9 (L) 14 - 54 U/L   Alkaline Phosphatase 66 38 - 126 U/L   Total Bilirubin 0.7 0.3 - 1.2 mg/dL   GFR calc non Af Amer >60 >60 mL/min   GFR calc Af Amer >60 >60 mL/min   Anion gap 6 5 - 15  Lipase, blood  Result Value Ref Range   Lipase 14 11 - 51 U/L  CBC with Differential  Result Value Ref Range   WBC 18.8 (H) 4.0 - 10.5 K/uL   RBC 4.15 3.87 - 5.11 MIL/uL   Hemoglobin 12.8 12.0 - 15.0 g/dL   HCT 37.4 36.0 - 46.0 %   MCV 90.1 78.0 - 100.0 fL   MCH 30.8 26.0 - 34.0 pg   MCHC 34.2 30.0 - 36.0 g/dL   RDW 13.2 11.5 - 15.5 %   Platelets 338 150 - 400 K/uL   Neutrophils Relative % 87 %   Neutro Abs 16.3 (H) 1.7 - 7.7 K/uL   Lymphocytes Relative 6 %   Lymphs Abs 1.2 0.7 - 4.0 K/uL   Monocytes Relative 6 %  Monocytes Absolute 1.1 (H) 0.1 - 1.0 K/uL   Eosinophils Relative 1 %   Eosinophils Absolute 0.2 0.0 - 0.7 K/uL   Basophils Relative 0 %   Basophils Absolute 0.0 0.0 - 0.1 K/uL  Urinalysis, Routine w reflex microscopic  Result Value Ref Range   Color, Urine YELLOW YELLOW   APPearance CLEAR CLEAR   Specific Gravity, Urine 1.010 1.005 - 1.030   pH 7.5 5.0 - 8.0   Glucose, UA NEGATIVE NEGATIVE mg/dL    Hgb urine dipstick SMALL (A) NEGATIVE   Bilirubin Urine NEGATIVE NEGATIVE   Ketones, ur NEGATIVE NEGATIVE mg/dL   Protein, ur NEGATIVE NEGATIVE mg/dL   Nitrite NEGATIVE NEGATIVE   Leukocytes, UA NEGATIVE NEGATIVE  Urinalysis, Microscopic (reflex)  Result Value Ref Range   RBC / HPF 0-5 0 - 5 RBC/hpf   WBC, UA 0-5 0 - 5 WBC/hpf   Bacteria, UA FEW (A) NONE SEEN   Squamous Epithelial / LPF 0-5 (A) NONE SEEN  I-Stat beta hCG blood, ED  Result Value Ref Range   I-stat hCG, quantitative <5.0 <5 mIU/mL   Comment 3           Laboratory interpretation all normal except mild hypokalemia, leukocytosis, hyperglycemia    EKG  EKG Interpretation None       Radiology Ct Abdomen Pelvis W Contrast  Result Date: 05/08/2016 CLINICAL DATA:  Acute onset of right lower quadrant abdominal pain and vaginal bleeding. Initial encounter. EXAM: CT ABDOMEN AND PELVIS WITH CONTRAST TECHNIQUE: Multidetector CT imaging of the abdomen and pelvis was performed using the standard protocol following bolus administration of intravenous contrast. CONTRAST:  137mL ISOVUE-300 IOPAMIDOL (ISOVUE-300) INJECTION 61% COMPARISON:  Pelvic ultrasound performed 08/24/2015, and MRI of the lumbar spine performed 02/11/2013 FINDINGS: Lower chest: Minimal bibasilar atelectasis is noted. The visualized portions of the mediastinum are unremarkable. Hepatobiliary: The liver is unremarkable in appearance. The gallbladder is unremarkable in appearance. The common bile duct remains normal in caliber. Pancreas: The pancreas is within normal limits. Spleen: The spleen is unremarkable in appearance. Adrenals/Urinary Tract: The adrenal glands are unremarkable in appearance. Nonobstructing left renal stones are seen, measuring up to 4 mm in size. There is no evidence of hydronephrosis. No obstructing ureteral stones are seen. No perinephric stranding is appreciated. Stomach/Bowel: The stomach is unremarkable in appearance. The small bowel is  within normal limits. The appendix is normal in caliber, without evidence of appendicitis. The colon is unremarkable in appearance. Vascular/Lymphatic: The abdominal aorta is unremarkable in appearance. The inferior vena cava is grossly unremarkable. No retroperitoneal lymphadenopathy is seen. No pelvic sidewall lymphadenopathy is identified. Reproductive: Mild soft tissue inflammation about the bladder may reflect cystitis. There is trace inflammation tracking about the pelvis. There is prominence of the periuterine vasculature, which could reflect pelvic congestion syndrome in the appropriate clinical situation. The uterus is otherwise unremarkable in appearance. No suspicious adnexal masses are seen. The ovaries are relatively symmetric. Other: No additional soft tissue abnormalities are seen. Musculoskeletal: No acute osseous abnormalities are identified. The visualized musculature is unremarkable in appearance. IMPRESSION: 1. Mild soft tissue inflammation about the bladder could reflect cystitis. 2. Prominence of the periuterine vasculature may reflect pelvic congestion syndrome in the appropriate clinical situation. Would correlate with the patient's symptoms. Trace nonspecific inflammation seen tracking about the pelvis. 3. Nonobstructing left renal stones measure up to 4 mm in size. No evidence of hydronephrosis. Electronically Signed   By: Garald Balding M.D.   On: 05/08/2016 03:34  Procedures Procedures (including critical care time)  Medications Ordered in ED Medications  sodium chloride 0.9 % bolus 1,000 mL (0 mLs Intravenous Stopped 05/08/16 0337)  fentaNYL (SUBLIMAZE) injection 50 mcg (50 mcg Intravenous Given 05/08/16 0213)  ondansetron (ZOFRAN) injection 4 mg (4 mg Intravenous Given 05/08/16 0212)  iopamidol (ISOVUE-300) 61 % injection 100 mL (100 mLs Intravenous Contrast Given 05/08/16 0314)  ketorolac (TORADOL) 30 MG/ML injection 30 mg (30 mg Intravenous Given 05/08/16 0344)     Initial  Impression / Assessment and Plan / ED Course  I have reviewed the triage vital signs and the nursing notes.  Pertinent labs & imaging results that were available during my care of the patient were reviewed by me and considered in my medical decision making (see chart for details).  Clinical Course    Patient was given IV fluids and IV fentanyl for pain.  Patient continued to complain of pain. She was then given Toradol IV.  After reviewing her CT result catheterization of urine was ordered. Recheck at 4:30 AM patient states her pain is better with Toradol. We discussed her CT results. Also discussed the need for the catheterized sample.  Etiology of patient's pain is not apparent based on the CT scan. Laboratory testing is not suggestive of acute inflammation of her gallbladder. Despite changing her feminine pads every 20 minutes she is not anemic. She is able to be followed up [facility].   Final Clinical Impressions(s) / ED Diagnoses   Final diagnoses:  Menorrhagia with regular cycle  Right lateral abdominal pain    New Prescriptions New Prescriptions   NAPROXEN (NAPROSYN) 500 MG TABLET    Take 1 tablet (500 mg total) by mouth 2 (two) times daily.    Plan discharge  Rolland Porter, MD, Barbette Or, MD 05/08/16 [id number]

## 2016-05-14 ENCOUNTER — Ambulatory Visit: Payer: Medicaid Other | Admitting: Obstetrics and Gynecology

## 2016-05-21 ENCOUNTER — Encounter: Payer: Self-pay | Admitting: Obstetrics and Gynecology

## 2016-05-21 ENCOUNTER — Ambulatory Visit (INDEPENDENT_AMBULATORY_CARE_PROVIDER_SITE_OTHER): Payer: BLUE CROSS/BLUE SHIELD | Admitting: Obstetrics and Gynecology

## 2016-05-21 VITALS — BP 138/92 | HR 109 | Wt 257.2 lb

## 2016-05-21 DIAGNOSIS — Z131 Encounter for screening for diabetes mellitus: Secondary | ICD-10-CM

## 2016-05-21 DIAGNOSIS — R1031 Right lower quadrant pain: Secondary | ICD-10-CM | POA: Diagnosis not present

## 2016-05-21 DIAGNOSIS — N898 Other specified noninflammatory disorders of vagina: Secondary | ICD-10-CM

## 2016-05-21 DIAGNOSIS — Z1159 Encounter for screening for other viral diseases: Secondary | ICD-10-CM

## 2016-05-21 DIAGNOSIS — N760 Acute vaginitis: Secondary | ICD-10-CM | POA: Diagnosis not present

## 2016-05-21 DIAGNOSIS — N920 Excessive and frequent menstruation with regular cycle: Secondary | ICD-10-CM | POA: Diagnosis not present

## 2016-05-21 DIAGNOSIS — Z118 Encounter for screening for other infectious and parasitic diseases: Principal | ICD-10-CM

## 2016-05-21 MED ORDER — METRONIDAZOLE 500 MG PO TABS: 500.0000 mg | ORAL_TABLET | Freq: Two times a day (BID) | ORAL | 0 refills | Status: DC

## 2016-05-21 MED ORDER — NORGESTIMATE-ETH ESTRADIOL 0.25-35 MG-MCG PO TABS: 1.0000 | ORAL_TABLET | Freq: Every day | ORAL | 11 refills | Status: DC

## 2016-05-21 MED ORDER — FLUCONAZOLE 150 MG PO TABS: 150.0000 mg | ORAL_TABLET | Freq: Once | ORAL | 1 refills | Status: AC

## 2016-05-21 NOTE — Addendum Note (Signed)
Addended by: Octaviano Glow on: 05/21/2016 04:51 PM   Modules accepted: Orders

## 2016-05-21 NOTE — Progress Notes (Signed)
Patient ID: [name], female   DOB: 09/18/1980, 36 y.o.   MRN: [id number]    [facility] Visit  @DATE @            Patient name: [name] MRN [id number]  Date of birth: 1981-03-19  CC & HPI:   Chief Complaint  Patient presents with  . Menorrhagia    x4 weeks     [name] is a 36 y.o. female presenting today for menorrhagia x4w with associated right-sided abdominal pain. She states her pain is not similar to pain with prior periods. Pt states the Megace she has previously been prescribed is no longer providing relief of her symptoms and she finished the last pill yesterday. She was seen in the ED on 05/08/16 for the same symptoms, had CT A/P and was told to f/u here. Pt states she is not currently sexually active, not on birth control and is not actively trying to conceive.   Pt also reports concern about diabetic tendencies in her family. CBG was 151 in ED on 05/08/16.   ROS:  ROS +menorrhagia +right-sided abdominal pain   Pertinent History Reviewed:   Reviewed: Significant for HTN, obesity  Medical         Past Medical History:  Diagnosis Date  . Allergy    banana/pineapple  . Anemia    post pregnancy  . Anxiety   . Asthma   . Autoimmune disease, not elsewhere classified(279.49) 2013   Non specific- [facility], abnormal Bone Marrow signal  . Breast lump in female 11/28/2012   Has tender round mass at 10-11 0'clock right breast 4 finger breaths from [place] will [place]  . Breast mass, right 08/18/2012  . Chronic breast pain 05/31/2015  . Dysmenorrhea 03/23/2014  . Dyspareunia 01/05/2014  . Fibroids, intramural 08/24/2015  . GERD (gastroesophageal reflux disease)   . Headache(784.0)   . History of abnormal cervical Pap smear 04/02/2014  . Hypertension   . LLQ abdominal tenderness 05/31/2015  . Menorrhagia 01/05/2014  . Neuromuscular disorder ([place])    lower back and bilat leg pain  . Obesity   . Other and unspecified ovarian cyst  01/12/2014  . Polyclonal gammopathy 07/26/2013   Insignificant  . Scoliosis   . Vaginal irritation 09/30/2014  . Vaginal Pap smear, abnormal                               Surgical Hx:    Past Surgical History:  Procedure Laterality Date  . DILATION AND CURETTAGE OF UTERUS    . TONSILLECTOMY     Medications: Reviewed & Updated - see associated section                       Current Outpatient Prescriptions:  .  albuterol (PROVENTIL HFA;VENTOLIN HFA) 108 (90 Base) MCG/ACT inhaler, Inhale 2 puffs into the lungs every 4 (four) hours as needed., Disp: 6.7 g, Rfl: 2 .  beclomethasone (QVAR) 80 MCG/ACT inhaler, Inhale 2 puffs into the lungs 2 (two) times daily., Disp: 1 Inhaler, Rfl: 12 .  cyclobenzaprine (FLEXERIL) 10 MG tablet, Take 1 tablet (10 mg total) by mouth 3 (three) times daily as needed for muscle spasms., Disp: 30 tablet, Rfl: 0 .  fluticasone (FLONASE) 50 MCG/ACT nasal spray, Place 2 sprays into both nostrils daily., Disp: 16 g, Rfl: 1 .  hydrochlorothiazide (HYDRODIURIL) 25 MG tablet, Take 1 tablet (25  mg total) by mouth daily., Disp: 30 tablet, Rfl: 6 .  ibuprofen (ADVIL,MOTRIN) 800 MG tablet, Take 1 tablet (800 mg total) by mouth every 8 (eight) hours as needed., Disp: 30 tablet, Rfl: 1 .  megestrol (MEGACE) 40 MG tablet, TAKE 3 TABLETS BY MOUTH X 5 DAYS, THEN 2 TABS X 5 DAYS, THEN 1 TAB DAILY UNTIL BLEEDING STOPS., Disp: 45 tablet, Rfl: 1 .  potassium chloride (K-DUR) 10 MEQ tablet, Take 1 tablet (10 mEq total) by mouth daily., Disp: 30 tablet, Rfl: 5 .  zolpidem (AMBIEN) 10 MG tablet, TAKE (1) TABLET BY MOUTH AT BEDTIME., Disp: 15 tablet, Rfl: 2 .  levocetirizine (XYZAL) 5 MG tablet, Take 1 tablet (5 mg total) by mouth every evening. (Patient not taking: Reported on 05/21/2016), Disp: 30 tablet, Rfl: 6 .  meloxicam (MOBIC) 15 MG tablet, Take 1 tablet (15 mg total) by mouth daily. (Patient not taking: Reported on 05/21/2016), Disp: 30 tablet, Rfl: 0 .  naproxen (NAPROSYN) 500 MG tablet,  Take 1 tablet (500 mg total) by mouth 2 (two) times daily. (Patient not taking: Reported on 05/21/2016), Disp: 30 tablet, Rfl: 0 .  predniSONE (DELTASONE) 20 MG tablet, Take 2 tablets (40 mg total) by mouth daily with breakfast. For 5 days (Patient not taking: Reported on 05/21/2016), Disp: 10 tablet, Rfl: 0 .  predniSONE (DELTASONE) 20 MG tablet, Take 1 tablet (20 mg total) by mouth daily with breakfast. (Patient not taking: Reported on 05/21/2016), Disp: 5 tablet, Rfl: 0   Social History: Reviewed -  reports that she has never smoked. She has never used smokeless tobacco.  Objective Findings:  Vitals: Blood pressure (!) 138/92, pulse (!) 109, weight 257 lb 3.2 oz (116.7 kg), last menstrual period 04/30/2016.  Physical Examination: General appearance - alert, well appearing, and in no distress Mental status - alert, oriented to person, place, and time Abdomen - soft, nontender, nondistended, no masses or organomegaly Pelvic -  VULVA: normal appearing vulva with no masses, tenderness or lesions,  VAGINA: normal appearing vagina with normal color, no lesions, heavy bloody mucoid discharge CERVIX: normal appearing cervix without discharge or lesions, Slight tenderness  UTERUS: uterus is normal size, shape, consistency.  Exam limited by BMI ADNEXA: normal adnexa in size, no masses. Slight adnexal tenderness. Exam is limited by BMI  Wet prep collected: many WBC and RBC. No tric.  KOH collected: negative   CT results 05/08/16:  Prominence of the periuterine vasculature may reflect pelvic congestion syndrome in the appropriate clinical situation. Would correlate with the patient's symptoms.  Transvaginal US results 08/24/15: Uterus                      8.5 x 7.3 x 4.8 cm,  anteverted uterus with a 9 x 6 x 7 mm post mid uterus intramural fibroid  Endometrium          4.4 mm, symmetrical, wnl  Right ovary             2.6 x 3 x 1.5 cm, wnl  Left ovary                2.7 x 2.6 x 2 cm,  wnl  Small amount of simple cul de sac fluid,prominent dilated vessels lt adnexa,lt adnexal pain during ultrasound   Technician Comments:  PELVIC US TA/TV: anteverted uterus with a 9 x 6 x 7 mm post mid uterus intramural fibroid, EEC 4.4 mm,normal ov's bilat (mobile),prominent dilated vessels in the left  adnexa,lt adnexal pain during ultrasound,small amount of simple cul de sac fluid      Assessment & Plan:   A:  1. Menorrhagia  2. Not actively seeking pregnancy  3. Cervicitis/BV  P:  1. Stop Megace 2. Rx Sprintec (3x/day for 1st week, take placebo week 1x/day, start new pack 1x/day)  3. Rx 1w Metronidazole  4. Collect A1C today 5. F/u in 4 weeks for recheck   By signing my name below, I, Hansel Feinstein, attest that this documentation has been prepared under the direction and in the presence of Jonnie Kind, MD. Electronically Signed: Hansel Feinstein, ED Scribe. 05/21/16. 12:10 PM.  I personally performed the services described in this documentation, which was SCRIBED in my presence. The recorded information has been reviewed and considered accurate. It has been edited as necessary during review. Jonnie Kind, MD

## 2016-05-22 LAB — HEMOGLOBIN A1C
Est. average glucose Bld gHb Est-mCnc: 91 mg/dL
Hgb A1c MFr Bld: 4.8 % (ref 4.8–5.6)

## 2016-05-23 LAB — GC/CHLAMYDIA PROBE AMP
Chlamydia trachomatis, NAA: POSITIVE — AB
Neisseria gonorrhoeae by PCR: POSITIVE — AB

## 2016-05-27 ENCOUNTER — Other Ambulatory Visit: Payer: Self-pay | Admitting: Obstetrics and Gynecology

## 2016-05-27 MED ORDER — DOXYCYCLINE HYCLATE 100 MG PO CAPS
100.0000 mg | ORAL_CAPSULE | Freq: Two times a day (BID) | ORAL | 1 refills | Status: DC
Start: 2016-05-27 — End: 2016-06-18

## 2016-05-27 MED ORDER — AZITHROMYCIN 500 MG PO TABS: 1000.0000 mg | ORAL_TABLET | Freq: Once | ORAL | 1 refills | Status: AC

## 2016-05-28 ENCOUNTER — Other Ambulatory Visit: Payer: BLUE CROSS/BLUE SHIELD

## 2016-05-28 ENCOUNTER — Telehealth: Payer: Self-pay | Admitting: Obstetrics and Gynecology

## 2016-05-28 ENCOUNTER — Ambulatory Visit (INDEPENDENT_AMBULATORY_CARE_PROVIDER_SITE_OTHER): Payer: BLUE CROSS/BLUE SHIELD | Admitting: *Deleted

## 2016-05-28 ENCOUNTER — Encounter: Payer: Self-pay | Admitting: *Deleted

## 2016-05-28 DIAGNOSIS — A549 Gonococcal infection, unspecified: Secondary | ICD-10-CM | POA: Diagnosis not present

## 2016-05-28 DIAGNOSIS — Z1159 Encounter for screening for other viral diseases: Secondary | ICD-10-CM

## 2016-05-28 MED ORDER — CEFTRIAXONE SODIUM 500 MG IJ SOLR
250.0000 mg | Freq: Once | INTRAMUSCULAR | Status: AC
  Administered 2016-05-28: 250 mg via INTRAMUSCULAR

## 2016-05-28 NOTE — Addendum Note (Signed)
Addended by: Linton Rump on: 05/28/2016 05:15 PM   Modules accepted: Level of Service

## 2016-05-28 NOTE — Progress Notes (Signed)
Pt here for Rocephin 250mg  for +CHL and +GC. Pt tolerated shot well. POC in 4 weeks. NO SEX until after POC appt. Pt voiced understanding. [place]

## 2016-05-28 NOTE — Telephone Encounter (Signed)
Pt informed of + GC and + Chl. Pt advised to come in for Rocephin, and for testing for HIV, RPR, HEP C, HEP B SAg

## 2016-05-29 LAB — HIV ANTIBODY (ROUTINE TESTING W REFLEX): HIV Screen 4th Generation wRfx: NONREACTIVE

## 2016-05-29 LAB — HEPATITIS B SURFACE ANTIGEN: Hepatitis B Surface Ag: NEGATIVE

## 2016-05-29 LAB — RPR: RPR: NONREACTIVE

## 2016-05-29 LAB — HEPATITIS C ANTIBODY: HEP C VIRUS AB: 0.1 {s_co_ratio} (ref 0.0–0.9)

## 2016-05-30 ENCOUNTER — Telehealth: Payer: Self-pay | Admitting: *Deleted

## 2016-05-30 NOTE — Telephone Encounter (Signed)
Informed patient results were normal.

## 2016-06-18 ENCOUNTER — Encounter: Payer: Self-pay | Admitting: Obstetrics and Gynecology

## 2016-06-18 ENCOUNTER — Ambulatory Visit (INDEPENDENT_AMBULATORY_CARE_PROVIDER_SITE_OTHER): Payer: BLUE CROSS/BLUE SHIELD | Admitting: Obstetrics and Gynecology

## 2016-06-18 VITALS — BP 110/78 | HR 82 | Wt 260.0 lb

## 2016-06-18 DIAGNOSIS — A749 Chlamydial infection, unspecified: Secondary | ICD-10-CM

## 2016-06-18 DIAGNOSIS — A549 Gonococcal infection, unspecified: Secondary | ICD-10-CM | POA: Diagnosis not present

## 2016-06-18 NOTE — Progress Notes (Signed)
Patient ID: [name], female   DOB: 11-Sep-1980, 36 y.o.   MRN: [id number]   [facility] Visit  @DATE @            Patient name: [name] MRN [id number]  Date of birth: 05-Oct-1980  CC & HPI:   Chief Complaint  Patient presents with  . Follow-up    POC     [name] is a 36 y.o. female presenting today for POC of CHL and GC tx on 05/28/16. Pt received 250mg  IM Rocephin and 2 tablets 500mg  Zithromax.   ROS:  ROS No complaints  Pertinent History Reviewed:   Reviewed: Significant for HTN, obesity  Medical         Past Medical History:  Diagnosis Date  . Allergy    banana/pineapple  . Anemia    post pregnancy  . Anxiety   . Asthma   . Autoimmune disease, not elsewhere classified(279.49) 2013   Non specific- [facility], abnormal Bone Marrow signal  . Breast lump in female 11/28/2012   Has tender round mass at 10-11 0'clock right breast 4 finger breaths from areola will [place]  . Breast mass, right 08/18/2012  . Chlamydia   . Chronic breast pain 05/31/2015  . Dysmenorrhea 03/23/2014  . Dyspareunia 01/05/2014  . Fibroids, intramural 08/24/2015  . GERD (gastroesophageal reflux disease)   . Gonorrhea   . Headache(784.0)   . History of abnormal cervical Pap smear 04/02/2014  . Hypertension   . LLQ abdominal tenderness 05/31/2015  . Menorrhagia 01/05/2014  . Neuromuscular disorder ([place])    lower back and bilat leg pain  . Obesity   . Other and unspecified ovarian cyst 01/12/2014  . Polyclonal gammopathy 07/26/2013   Insignificant  . Scoliosis   . Vaginal irritation 09/30/2014  . Vaginal Pap smear, abnormal                               Surgical Hx:    Past Surgical History:  Procedure Laterality Date  . DILATION AND CURETTAGE OF UTERUS    . TONSILLECTOMY     Medications: Reviewed & Updated - see associated section                       Current Outpatient Prescriptions:  .  albuterol (PROVENTIL HFA;VENTOLIN HFA) 108 (90 Base) MCG/ACT  inhaler, Inhale 2 puffs into the lungs every 4 (four) hours as needed., Disp: 6.7 g, Rfl: 2 .  beclomethasone (QVAR) 80 MCG/ACT inhaler, Inhale 2 puffs into the lungs 2 (two) times daily., Disp: 1 Inhaler, Rfl: 12 .  cyclobenzaprine (FLEXERIL) 10 MG tablet, Take 1 tablet (10 mg total) by mouth 3 (three) times daily as needed for muscle spasms., Disp: 30 tablet, Rfl: 0 .  fluticasone (FLONASE) 50 MCG/ACT nasal spray, Place 2 sprays into both nostrils daily., Disp: 16 g, Rfl: 1 .  hydrochlorothiazide (HYDRODIURIL) 25 MG tablet, Take 1 tablet (25 mg total) by mouth daily., Disp: 30 tablet, Rfl: 6 .  ibuprofen (ADVIL,MOTRIN) 800 MG tablet, Take 1 tablet (800 mg total) by mouth every 8 (eight) hours as needed., Disp: 30 tablet, Rfl: 1 .  norgestimate-ethinyl estradiol (ORTHO-CYCLEN,SPRINTEC,PREVIFEM) 0.25-35 MG-MCG tablet, Take 1 tablet by mouth daily., Disp: 1 Package, Rfl: 11 .  potassium chloride (K-DUR) 10 MEQ tablet, Take 1 tablet (10 mEq total) by mouth daily., Disp: 30 tablet, Rfl: 5 .  zolpidem (AMBIEN)  10 MG tablet, TAKE (1) TABLET BY MOUTH AT BEDTIME., Disp: 15 tablet, Rfl: 2 .  meloxicam (MOBIC) 15 MG tablet, Take 1 tablet (15 mg total) by mouth daily. (Patient not taking: Reported on 06/18/2016), Disp: 30 tablet, Rfl: 0   Social History: Reviewed -  reports that she has never smoked. She has never used smokeless tobacco.  Objective Findings:  Vitals: Blood pressure 110/78, pulse 82, weight 260 lb (117.9 kg), last menstrual period 05/21/2016.  Physical Examination: General appearance - alert, well appearing, and in no distress Mental status - alert, oriented to person, place, and time Abdomen - soft, nontender, nondistended, no masses or organomegaly Pelvic -  VULVA: normal appearing vulva with no masses, tenderness or lesions,  VAGINA: normal appearing vagina with normal color and discharge, no lesions,  CERVIX: normal appearing cervix without discharge or lesions  GC/CHL collected    Assessment & Plan:   A:  1. Encounter for POC of +GC/CHL  P:  1. F/u PRN or in 1 year    By signing my name below, I, Hansel Feinstein, attest that this documentation has been prepared under the direction and in the presence of Jonnie Kind, MD. Electronically Signed: Hansel Feinstein, ED Scribe. 06/18/16. 3:44 PM.  I personally performed the services described in this documentation, which was SCRIBED in my presence. The recorded information has been reviewed and considered accurate. It has been edited as necessary during review. Jonnie Kind, MD

## 2016-06-20 ENCOUNTER — Telehealth: Payer: Self-pay | Admitting: Adult Health

## 2016-06-20 LAB — GC/CHLAMYDIA PROBE AMP
Chlamydia trachomatis, NAA: POSITIVE — AB
Neisseria gonorrhoeae by PCR: NEGATIVE

## 2016-06-20 NOTE — Telephone Encounter (Signed)
Spoke with pt letting her know her CHL is +. I spoke with Dr. Glo Herring and he ordered Azithromycin 1000 mg po x 1 dose with 1 refill. Needs POC in 4 weeks. Advised NO SEX until after POC appt. Call transferred to front desk for POC appt. Med called into pharmacy. Letter sent to health dept. [place]

## 2016-06-25 ENCOUNTER — Ambulatory Visit: Payer: BLUE CROSS/BLUE SHIELD | Admitting: Adult Health

## 2016-07-18 ENCOUNTER — Ambulatory Visit (INDEPENDENT_AMBULATORY_CARE_PROVIDER_SITE_OTHER): Payer: BLUE CROSS/BLUE SHIELD | Admitting: Adult Health

## 2016-07-18 ENCOUNTER — Encounter: Payer: Self-pay | Admitting: Adult Health

## 2016-07-18 VITALS — BP 130/80 | HR 90 | Ht 65.0 in | Wt 260.5 lb

## 2016-07-18 DIAGNOSIS — Z8619 Personal history of other infectious and parasitic diseases: Secondary | ICD-10-CM | POA: Diagnosis not present

## 2016-07-18 NOTE — Progress Notes (Signed)
Subjective:     Patient ID: [name], female   DOB: Oct 17, 1980, 36 y.o.   MRN: [id number]  HPI [name] is a 36 year old black female in for proof of cure of recent chlamydia.Took meds and has not had sex.  Review of Systems Patient denies any headaches, hearing loss, fatigue, blurred vision, shortness of breath, chest pain, abdominal pain, problems with bowel movements, urination, or intercourse. No joint pain or mood swings.Periods better on OCs.For proof of cure recent +chlamydia  Reviewed past medical,surgical, social and family history. Reviewed medications and allergies.     Objective:   Physical Exam BP 130/80 (BP Location: Left Arm, Patient Position: Sitting, Cuff Size: Large)   Pulse 90   Ht 5\' 5"  (1.651 m)   Wt 260 lb 8 oz (118.2 kg)   LMP 07/18/2016 (Exact Date)   BMI 43.35 kg/m Skin warm and dry.Pelvic: external genitalia is normal in appearance no lesions, vagina: + period like blood,urethra has no lesions or masses noted, cervix:smooth and bulbous. GC/CHL obtained.     Assessment:     History of chlamydia for proof of cure    Plan:     GC.CHL sent  Use condoms Follow up prn

## 2016-07-18 NOTE — Patient Instructions (Signed)
Use condoms  

## 2016-07-20 LAB — GC/CHLAMYDIA PROBE AMP
Chlamydia trachomatis, NAA: NEGATIVE
Neisseria gonorrhoeae by PCR: NEGATIVE

## 2016-09-13 ENCOUNTER — Emergency Department (HOSPITAL_COMMUNITY)
Admission: EM | Admit: 2016-09-13 | Discharge: 2016-09-13 | Disposition: A | Discharge status: Home / Self Care | Payer: Medicaid Other | Attending: Emergency Medicine | Admitting: Emergency Medicine

## 2016-09-13 ENCOUNTER — Encounter (HOSPITAL_COMMUNITY): Payer: Self-pay | Admitting: Emergency Medicine

## 2016-09-13 DIAGNOSIS — J45909 Unspecified asthma, uncomplicated: Secondary | ICD-10-CM | POA: Insufficient documentation

## 2016-09-13 DIAGNOSIS — Z79899 Other long term (current) drug therapy: Secondary | ICD-10-CM | POA: Insufficient documentation

## 2016-09-13 DIAGNOSIS — Z202 Contact with and (suspected) exposure to infections with a predominantly sexual mode of transmission: Secondary | ICD-10-CM

## 2016-09-13 DIAGNOSIS — I10 Essential (primary) hypertension: Secondary | ICD-10-CM | POA: Insufficient documentation

## 2016-09-13 DIAGNOSIS — N760 Acute vaginitis: Secondary | ICD-10-CM | POA: Insufficient documentation

## 2016-09-13 DIAGNOSIS — B9689 Other specified bacterial agents as the cause of diseases classified elsewhere: Secondary | ICD-10-CM

## 2016-09-13 LAB — WET PREP, GENITAL
SPERM: NONE SEEN
Trich, Wet Prep: NONE SEEN
Yeast Wet Prep HPF POC: NONE SEEN

## 2016-09-13 MED ORDER — AZITHROMYCIN 250 MG PO TABS
1000.0000 mg | ORAL_TABLET | Freq: Once | ORAL | Status: AC
  Administered 2016-09-13: 1000 mg via ORAL
  Filled 2016-09-13: qty 4

## 2016-09-13 MED ORDER — CEFTRIAXONE SODIUM 250 MG IJ SOLR
250.0000 mg | Freq: Once | INTRAMUSCULAR | Status: AC
  Administered 2016-09-13: 250 mg via INTRAMUSCULAR
  Filled 2016-09-13: qty 250

## 2016-09-13 MED ORDER — METRONIDAZOLE 500 MG PO TABS: 500.0000 mg | ORAL_TABLET | Freq: Two times a day (BID) | ORAL | 0 refills | Status: DC

## 2016-09-13 MED ORDER — LIDOCAINE HCL (PF) 1 % IJ SOLN
INTRAMUSCULAR | Status: AC
  Administered 2016-09-13: 0.9 mL
  Filled 2016-09-13: qty 5

## 2016-09-13 NOTE — ED Provider Notes (Signed)
[facility] DEPT Provider Note   CSN: [id number] Arrival date & time: 09/13/16  1400     History   Chief Complaint Chief Complaint  Patient presents with  . Exposure to STD    HPI [name] is a 36 y.o. female.  Patient is a 36 year old female who presents to the emergency department for STD exposure.  The patient states that she was recently contacted by her boyfriend and told that he had been tested positive for gonorrhea and Chlamydia, and suggested that she be evaluated. Patient denies any discharge, fever, rash, or other difficulties.      Past Medical History:  Diagnosis Date  . Allergy    banana/pineapple  . Anemia    post pregnancy  . Anxiety   . Asthma   . Autoimmune disease, not elsewhere classified(279.49) 2013   Non specific- [facility], abnormal Bone Marrow signal  . Breast lump in female 11/28/2012   Has tender round mass at 10-11 0'clock right breast 4 finger breaths from [place] will [place]  . Breast mass, right 08/18/2012  . Chlamydia   . Chronic breast pain 05/31/2015  . Dysmenorrhea 03/23/2014  . Dyspareunia 01/05/2014  . Fibroids, intramural 08/24/2015  . GERD (gastroesophageal reflux disease)   . Gonorrhea   . Headache(784.0)   . History of abnormal cervical Pap smear 04/02/2014  . Hypertension   . LLQ abdominal tenderness 05/31/2015  . Menorrhagia 01/05/2014  . Neuromuscular disorder ([place])    lower back and bilat leg pain  . Obesity   . Other and unspecified ovarian cyst 01/12/2014  . Polyclonal gammopathy 07/26/2013   Insignificant  . Scoliosis   . Vaginal irritation 09/30/2014  . Vaginal Pap smear, abnormal     Patient Active Problem List   Diagnosis Date Noted  . Plantar fasciitis 12/16/2015  . Allergic rhinitis 09/14/2015  . Fibroids, intramural 08/24/2015  . LLQ abdominal tenderness 05/31/2015  . Chronic breast pain 05/31/2015  . Glucose intolerance (impaired glucose tolerance) 03/16/2015  . Knee sprain 03/16/2015  .  Acute sinusitis 01/04/2015  . Vaginal irritation 09/30/2014  . History of abnormal cervical Pap smear 04/02/2014  . Dysmenorrhea 03/23/2014  . Other and unspecified ovarian cyst 01/12/2014  . Menorrhagia 01/05/2014  . Dyspareunia 01/05/2014  . Polyclonal gammopathy 07/26/2013  . Dysplasia of cervix, unspecified 03/31/2013  . DUB (dysfunctional uterine bleeding) 03/10/2013  . Breast lump in female 11/28/2012  . Heartburn 04/18/2012  . Insomnia 12/09/2011  . Asthma in adult 04/18/2011  . Hypertension 09/21/2010  . FATIGUE 12/13/2009  . HEADACHE 01/18/2009  . Obesity 11/03/2007  . Generalized anxiety disorder 11/03/2007  . Chronic low back pain 11/03/2007    Past Surgical History:  Procedure Laterality Date  . DILATION AND CURETTAGE OF UTERUS    . TONSILLECTOMY      OB History    Gravida Para Term Preterm AB Living   1 1 1    0 1   SAB TAB Ectopic Multiple Live Births   0       1       Home Medications    Prior to Admission medications   Medication Sig Start Date End Date Taking? Authorizing Provider  albuterol (PROVENTIL HFA;VENTOLIN HFA) 108 (90 Base) MCG/ACT inhaler Inhale 2 puffs into the lungs every 4 (four) hours as needed. 12/16/15   Alycia Rossetti, MD  beclomethasone (QVAR) 80 MCG/ACT inhaler Inhale 2 puffs into the lungs 2 (two) times daily. 03/16/15   Alycia Rossetti, MD  cyclobenzaprine (FLEXERIL) 10 MG tablet Take 1 tablet (10 mg total) by mouth 3 (three) times daily as needed for muscle spasms. 02/20/16   Dena Billet B, PA-C  fluticasone (FLONASE) 50 MCG/ACT nasal spray Place 2 sprays into both nostrils daily. Patient taking differently: Place 2 sprays into both nostrils as needed.  06/21/14   Alycia Rossetti, MD  hydrochlorothiazide (HYDRODIURIL) 25 MG tablet Take 1 tablet (25 mg total) by mouth daily. 05/06/15   Alycia Rossetti, MD  ibuprofen (ADVIL,MOTRIN) 800 MG tablet Take 1 tablet (800 mg total) by mouth every 8 (eight) hours as needed. 06/21/14    Alycia Rossetti, MD  norgestimate-ethinyl estradiol (ORTHO-CYCLEN,SPRINTEC,PREVIFEM) 0.25-35 MG-MCG tablet Take 1 tablet by mouth daily. 05/21/16   Jonnie Kind, MD  potassium chloride (K-DUR) 10 MEQ tablet Take 1 tablet (10 mEq total) by mouth daily. 10/13/13   Alycia Rossetti, MD  zolpidem (AMBIEN) 10 MG tablet TAKE (1) TABLET BY MOUTH AT BEDTIME. 02/28/16   Alycia Rossetti, MD    Family History Family History  Problem Relation Age of Onset  . Hypertension Mother   . Diabetes Mother   . Heart disease Mother   . Diabetes Father   . Cancer Paternal Aunt        breast  . Hypertension Maternal Grandmother   . Diabetes Son        pre diabetic  . Other Son        overactive bladder    Social History Social History  Substance Use Topics  . Smoking status: Never Smoker  . Smokeless tobacco: Never Used  . Alcohol use No     Allergies   Banana and Pineapple   Review of Systems Review of Systems  Constitutional: Negative for activity change and appetite change.  HENT: Negative for congestion, ear discharge, ear pain, facial swelling, nosebleeds, rhinorrhea, sneezing and tinnitus.   Eyes: Negative for photophobia, pain and discharge.  Respiratory: Negative for cough, choking, shortness of breath and wheezing.   Cardiovascular: Negative for chest pain, palpitations and leg swelling.  Gastrointestinal: Negative for abdominal pain, blood in stool, constipation, diarrhea, nausea and vomiting.  Genitourinary: Negative for difficulty urinating, dysuria, flank pain, frequency and hematuria.  Musculoskeletal: Negative for back pain, gait problem, myalgias and neck pain.  Skin: Negative for color change, rash and wound.  Neurological: Negative for dizziness, seizures, syncope, facial asymmetry, speech difficulty, weakness and numbness.  Hematological: Negative for adenopathy. Does not bruise/bleed easily.  Psychiatric/Behavioral: Negative for agitation, confusion, hallucinations,  self-injury and suicidal ideas. The patient is not nervous/anxious.      Physical Exam Updated Vital Signs BP (!) 126/93 (BP Location: Right Arm)   Pulse 90   Temp 99.3 F (37.4 C) (Oral)   Resp 14   Ht 5\' 5"  (1.651 m)   Wt 95.3 kg   LMP 09/13/2016   SpO2 98%   BMI 34.95 kg/m   Physical Exam  Constitutional: She is oriented to person, place, and time. She appears well-developed and well-nourished.  Non-toxic appearance.  HENT:  Head: Normocephalic.  Right Ear: Tympanic membrane and external ear normal.  Left Ear: Tympanic membrane and external ear normal.  Eyes: EOM and lids are normal. Pupils are equal, round, and reactive to light.  Neck: Normal range of motion. Neck supple. Carotid bruit is not present.  Cardiovascular: Normal rate, regular rhythm, normal heart sounds, intact distal pulses and normal pulses.   Pulmonary/Chest: Breath sounds normal. No respiratory distress.  Abdominal:  Soft. Bowel sounds are normal. There is no tenderness. There is no guarding.  Genitourinary:  Genitourinary Comments: Chaperone present.  No acute abnormalities of the external structures. There is no foreign body noted in the vaginal vault. There is blood from the os of the cervix. Patient states she is on her menstrual cycle. There is no wall motion abnormality. There is no adnexal tenderness or mass appreciated.  Musculoskeletal: Normal range of motion.  Lymphadenopathy:       Head (right side): No submandibular adenopathy present.       Head (left side): No submandibular adenopathy present.    She has no cervical adenopathy.  Neurological: She is alert and oriented to person, place, and time. She has normal strength. No cranial nerve deficit or sensory deficit.  Skin: Skin is warm and dry.  Psychiatric: She has a normal mood and affect. Her speech is normal.  Nursing note and vitals reviewed.    ED Treatments / Results  Labs (all labs ordered are listed, but only abnormal results  are displayed) Labs Reviewed  WET PREP, GENITAL  RPR  HIV ANTIBODY (ROUTINE TESTING)  GC/CHLAMYDIA PROBE AMP ([place]) NOT AT [facility]    EKG  EKG Interpretation None       Radiology No results found.  Procedures Procedures (including critical care time)  Medications Ordered in ED Medications - No data to display   Initial Impression / Assessment and Plan / ED Course  I have reviewed the triage vital signs and the nursing notes.  Pertinent labs & imaging results that were available during my care of the patient were reviewed by me and considered in my medical decision making (see chart for details).       Final Clinical Impressions(s) / ED Diagnoses MGM Vital signs reviewed.  Cultures sent to the lab. Patient treated in the emergency department with intramuscular Rocephin, and oral Zithromax. I've instructed the patient to follow-up with the local health department for additional evaluation and testing. The patient is aware that if there are any abnormalities in today's testing that someone from the flow managers office will call with further instructions. Patient in agreement with this plan.    Final diagnoses:  STD exposure  Bacterial vaginosis    New Prescriptions Discharge Medication List as of 09/13/2016  3:32 PM    START taking these medications   Details  metroNIDAZOLE (FLAGYL) 500 MG tablet Take 1 tablet (500 mg total) by mouth 2 (two) times daily., Starting Thu 09/13/2016, Print         Lily Kocher, PA-C 09/15/16 [place], [place], DO 09/18/16 1308

## 2016-09-13 NOTE — ED Triage Notes (Signed)
Patient requesting STD check. Per patient was contacted by boyfriend, stating that he was positive for gonorrhea and chlamydia. Patient denies any symptoms.

## 2016-09-13 NOTE — Discharge Instructions (Signed)
You were treated in the emergency department with medication to cover possible gonorrhea or chlamydia. Your tests have been sent to the lab. And someone from the flow managers office will call you if any acute abnormality. Please refrain from all sexual activity over the next 7 days. Your wet prep reveals bacterial vaginosis. Please use Flagyl with a meal 2 times daily. Please do not use any alcohol products while taking the Flagyl, as it will make you very sick.

## 2016-09-14 LAB — GC/CHLAMYDIA PROBE AMP (~~LOC~~) NOT AT ARMC
Chlamydia: POSITIVE — AB
Neisseria Gonorrhea: POSITIVE — AB

## 2016-09-14 LAB — HIV ANTIBODY (ROUTINE TESTING W REFLEX): HIV Screen 4th Generation wRfx: NONREACTIVE

## 2016-09-14 LAB — RPR: RPR Ser Ql: NONREACTIVE

## 2016-10-25 ENCOUNTER — Emergency Department (HOSPITAL_COMMUNITY)
Admission: EM | Admit: 2016-10-25 | Discharge: 2016-10-25 | Disposition: A | Discharge status: Home / Self Care | Payer: Self-pay | Attending: Emergency Medicine | Admitting: Emergency Medicine

## 2016-10-25 ENCOUNTER — Encounter (HOSPITAL_COMMUNITY): Payer: Self-pay | Admitting: Emergency Medicine

## 2016-10-25 ENCOUNTER — Emergency Department (HOSPITAL_COMMUNITY): Payer: Self-pay

## 2016-10-25 DIAGNOSIS — J45909 Unspecified asthma, uncomplicated: Secondary | ICD-10-CM | POA: Insufficient documentation

## 2016-10-25 DIAGNOSIS — I10 Essential (primary) hypertension: Secondary | ICD-10-CM | POA: Insufficient documentation

## 2016-10-25 DIAGNOSIS — Y9301 Activity, walking, marching and hiking: Secondary | ICD-10-CM | POA: Insufficient documentation

## 2016-10-25 DIAGNOSIS — S93402A Sprain of unspecified ligament of left ankle, initial encounter: Secondary | ICD-10-CM | POA: Insufficient documentation

## 2016-10-25 DIAGNOSIS — Y929 Unspecified place or not applicable: Secondary | ICD-10-CM | POA: Insufficient documentation

## 2016-10-25 DIAGNOSIS — X58XXXA Exposure to other specified factors, initial encounter: Secondary | ICD-10-CM | POA: Insufficient documentation

## 2016-10-25 DIAGNOSIS — Y999 Unspecified external cause status: Secondary | ICD-10-CM | POA: Insufficient documentation

## 2016-10-25 MED ORDER — IBUPROFEN 600 MG PO TABS: 600.0000 mg | ORAL_TABLET | Freq: Four times a day (QID) | ORAL | 0 refills | Status: DC

## 2016-10-25 MED ORDER — TRAMADOL HCL 50 MG PO TABS: 50.0000 mg | ORAL_TABLET | Freq: Four times a day (QID) | ORAL | 0 refills | Status: DC | PRN

## 2016-10-25 MED ORDER — IBUPROFEN 800 MG PO TABS
800.0000 mg | ORAL_TABLET | Freq: Once | ORAL | Status: AC
  Administered 2016-10-25: 800 mg via ORAL
  Filled 2016-10-25: qty 1

## 2016-10-25 MED ORDER — ACETAMINOPHEN 325 MG PO TABS
650.0000 mg | ORAL_TABLET | Freq: Once | ORAL | Status: AC
  Administered 2016-10-25: 650 mg via ORAL
  Filled 2016-10-25: qty 2

## 2016-10-25 NOTE — Discharge Instructions (Signed)
Your x-rays are negative for fracture or dislocation. Your examination favors an ankle sprain. Please use your splint over the next 5-7 days. Use crutches until you can safely put weight on your foot. Please apply ice today and tomorrow. Elevate your foot above your waist when possible. Use 600 mg of ibuprofen with breakfast, lunch, dinner, and at bedtime. May use Ultram for more severe pain.This medication may cause drowsiness. Please do not drink, drive, or participate in activity that requires concentration while taking this medication. Please see Dr. Aline Brochure for orthopedic management if not improving.

## 2016-10-25 NOTE — ED Provider Notes (Signed)
[facility] DEPT Provider Note   CSN: [id number] Arrival date & time: 10/25/16  1747     History   Chief Complaint Chief Complaint  Patient presents with  . Foot Pain    HPI [name] is a 36 y.o. female.  Patient is a 36 year old female who presents to the emergency department with complaint of left foot pain.  The patient states that she walked on on level ground on yesterday. She had some mild discomfort of her foot on last night. This morning she could not put weight on her left foot. The pain is mostly on the lateral portion of the foot. She has not had any recent operations or procedures. She's not had this particular pain in the past. There's been no known injury to the foot. The patient has tried Tylenol for discomfort but this has not been effective. She presents now for assistance with this problem.      Past Medical History:  Diagnosis Date  . Allergy    banana/pineapple  . Anemia    post pregnancy  . Anxiety   . Asthma   . Autoimmune disease, not elsewhere classified(279.49) 2013   Non specific- [facility], abnormal Bone Marrow signal  . Breast lump in female 11/28/2012   Has tender round mass at 10-11 0'clock right breast 4 finger breaths from areola will [place]  . Breast mass, right 08/18/2012  . Chlamydia   . Chronic breast pain 05/31/2015  . Dysmenorrhea 03/23/2014  . Dyspareunia 01/05/2014  . Fibroids, intramural 08/24/2015  . GERD (gastroesophageal reflux disease)   . Gonorrhea   . Headache(784.0)   . History of abnormal cervical Pap smear 04/02/2014  . Hypertension   . LLQ abdominal tenderness 05/31/2015  . Menorrhagia 01/05/2014  . Neuromuscular disorder ([place])    lower back and bilat leg pain  . Obesity   . Other and unspecified ovarian cyst 01/12/2014  . Polyclonal gammopathy 07/26/2013   Insignificant  . Scoliosis   . Vaginal irritation 09/30/2014  . Vaginal Pap smear, abnormal     Patient Active Problem List   Diagnosis Date  Noted  . Plantar fasciitis 12/16/2015  . Allergic rhinitis 09/14/2015  . Fibroids, intramural 08/24/2015  . LLQ abdominal tenderness 05/31/2015  . Chronic breast pain 05/31/2015  . Glucose intolerance (impaired glucose tolerance) 03/16/2015  . Knee sprain 03/16/2015  . Acute sinusitis 01/04/2015  . Vaginal irritation 09/30/2014  . History of abnormal cervical Pap smear 04/02/2014  . Dysmenorrhea 03/23/2014  . Other and unspecified ovarian cyst 01/12/2014  . Menorrhagia 01/05/2014  . Dyspareunia 01/05/2014  . Polyclonal gammopathy 07/26/2013  . Dysplasia of cervix, unspecified 03/31/2013  . DUB (dysfunctional uterine bleeding) 03/10/2013  . Breast lump in female 11/28/2012  . Heartburn 04/18/2012  . Insomnia 12/09/2011  . Asthma in adult 04/18/2011  . Hypertension 09/21/2010  . FATIGUE 12/13/2009  . HEADACHE 01/18/2009  . Obesity 11/03/2007  . Generalized anxiety disorder 11/03/2007  . Chronic low back pain 11/03/2007    Past Surgical History:  Procedure Laterality Date  . DILATION AND CURETTAGE OF UTERUS    . TONSILLECTOMY      OB History    Gravida Para Term Preterm AB Living   1 1 1    0 1   SAB TAB Ectopic Multiple Live Births   0       1       Home Medications    Prior to Admission medications   Medication Sig Start Date End Date  Taking? Authorizing Provider  albuterol (PROVENTIL HFA;VENTOLIN HFA) 108 (90 Base) MCG/ACT inhaler Inhale 2 puffs into the lungs every 4 (four) hours as needed. 12/16/15   Alycia Rossetti, MD  beclomethasone (QVAR) 80 MCG/ACT inhaler Inhale 2 puffs into the lungs 2 (two) times daily. 03/16/15   Alycia Rossetti, MD  cyclobenzaprine (FLEXERIL) 10 MG tablet Take 1 tablet (10 mg total) by mouth 3 (three) times daily as needed for muscle spasms. 02/20/16   Dena Billet B, PA-C  fluticasone (FLONASE) 50 MCG/ACT nasal spray Place 2 sprays into both nostrils daily. Patient taking differently: Place 2 sprays into both nostrils as needed.   06/21/14   Alycia Rossetti, MD  hydrochlorothiazide (HYDRODIURIL) 25 MG tablet Take 1 tablet (25 mg total) by mouth daily. 05/06/15   Alycia Rossetti, MD  ibuprofen (ADVIL,MOTRIN) 800 MG tablet Take 1 tablet (800 mg total) by mouth every 8 (eight) hours as needed. 06/21/14   Alycia Rossetti, MD  metroNIDAZOLE (FLAGYL) 500 MG tablet Take 1 tablet (500 mg total) by mouth 2 (two) times daily. 09/13/16   Lily Kocher, PA-C  norgestimate-ethinyl estradiol (ORTHO-CYCLEN,SPRINTEC,PREVIFEM) 0.25-35 MG-MCG tablet Take 1 tablet by mouth daily. 05/21/16   Jonnie Kind, MD  potassium chloride (K-DUR) 10 MEQ tablet Take 1 tablet (10 mEq total) by mouth daily. 10/13/13   Alycia Rossetti, MD  zolpidem (AMBIEN) 10 MG tablet TAKE (1) TABLET BY MOUTH AT BEDTIME. 02/28/16   Alycia Rossetti, MD    Family History Family History  Problem Relation Age of Onset  . Hypertension Mother   . Diabetes Mother   . Heart disease Mother   . Diabetes Father   . Cancer Paternal Aunt        breast  . Hypertension Maternal Grandmother   . Diabetes Son        pre diabetic  . Other Son        overactive bladder    Social History Social History  Substance Use Topics  . Smoking status: Never Smoker  . Smokeless tobacco: Never Used  . Alcohol use No     Allergies   Banana and Pineapple   Review of Systems Review of Systems  Constitutional: Negative for activity change and appetite change.  HENT: Negative for congestion, ear discharge, ear pain, facial swelling, nosebleeds, rhinorrhea, sneezing and tinnitus.   Eyes: Negative for photophobia, pain and discharge.  Respiratory: Negative for cough, choking, shortness of breath and wheezing.   Cardiovascular: Negative for chest pain, palpitations and leg swelling.  Gastrointestinal: Negative for abdominal pain, blood in stool, constipation, diarrhea, nausea and vomiting.  Genitourinary: Negative for difficulty urinating, dysuria, flank pain, frequency and  hematuria.  Musculoskeletal: Negative for back pain, gait problem, myalgias and neck pain.       Foot pain  Skin: Negative for color change, rash and wound.  Neurological: Negative for dizziness, seizures, syncope, facial asymmetry, speech difficulty, weakness and numbness.  Hematological: Negative for adenopathy. Does not bruise/bleed easily.  Psychiatric/Behavioral: Negative for agitation, confusion, hallucinations, self-injury and suicidal ideas. The patient is not nervous/anxious.      Physical Exam Updated Vital Signs BP (!) 140/110   Pulse 85   Temp 98.7 F (37.1 C) (Oral)   Resp 18   Ht 5\' 5"  (1.651 m)   Wt 97.5 kg (215 lb)   SpO2 100%   BMI 35.78 kg/m   Physical Exam  Constitutional: Vital signs are normal. She appears well-developed and well-nourished. She is  active.  HENT:  Head: Normocephalic and atraumatic.  Right Ear: Tympanic membrane, external ear and ear canal normal.  Left Ear: Tympanic membrane, external ear and ear canal normal.  Nose: Nose normal.  Mouth/Throat: Uvula is midline, oropharynx is clear and moist and mucous membranes are normal.  Eyes: Conjunctivae, EOM and lids are normal. Pupils are equal, round, and reactive to light.  Neck: Trachea normal, normal range of motion and phonation normal. Neck supple. Carotid bruit is not present.  Cardiovascular: Normal rate, regular rhythm and normal pulses.   Pulmonary/Chest: Effort normal and breath sounds normal.  Abdominal: Soft. Normal appearance and bowel sounds are normal.  Musculoskeletal: She exhibits tenderness.       Left foot: There is decreased range of motion and tenderness. There is no deformity.       Feet:  Lymphadenopathy:       Head (right side): No submental, no preauricular and no posterior auricular adenopathy present.       Head (left side): No submental, no preauricular and no posterior auricular adenopathy present.    She has no cervical adenopathy.  Neurological: She is alert. She  has normal strength. No cranial nerve deficit or sensory deficit. GCS eye subscore is 4. GCS verbal subscore is 5. GCS motor subscore is 6.  Skin: Skin is warm and dry.  Psychiatric: Her speech is normal.     ED Treatments / Results  Labs (all labs ordered are listed, but only abnormal results are displayed) Labs Reviewed - No data to display  EKG  EKG Interpretation None       Radiology No results found.  Procedures Procedures (including critical care time)  Medications Ordered in ED Medications - No data to display   Initial Impression / Assessment and Plan / ED Course  I have reviewed the triage vital signs and the nursing notes.  Pertinent labs & imaging results that were available during my care of the patient were reviewed by me and considered in my medical decision making (see chart for details).       Final Clinical Impressions(s) / ED Diagnoses MDm Blood pressure is elevated, o/w vital signs stable. Xray of the left foot is negative for fx or dislocation. No vascular issue noted. I suspect pt has a left ankle sprain. Pt fitted with ASO splint. Pt to use ice and elevation Rx for ultram to use for pain. Pt to follow up with orthopedics if not improving.   Final diagnoses:  Sprain of left ankle, unspecified ligament, initial encounter    New Prescriptions Discharge Medication List as of 10/25/2016  8:44 PM    START taking these medications   Details  traMADol (ULTRAM) 50 MG tablet Take 1 tablet (50 mg total) by mouth every 6 (six) hours as needed., Starting Thu 10/25/2016, Print         Marshell Levan, [place], PA-C 10/27/16 0139    Julianne Rice, MD 11/01/16 1143

## 2016-10-25 NOTE — ED Triage Notes (Signed)
Pt states " I got out of bed and my foot started hurting and I cant put any weight on it"  No recent injuries.

## 2016-10-25 NOTE — ED Notes (Signed)
Pt ambulatory to waiting room with assistance from crutches. Pt verbalized understanding of discharge instructions.

## 2017-02-03 ENCOUNTER — Emergency Department (HOSPITAL_COMMUNITY)
Admission: EM | Admit: 2017-02-03 | Discharge: 2017-02-03 | Disposition: A | Discharge status: Home / Self Care | Payer: Self-pay | Attending: Emergency Medicine | Admitting: Emergency Medicine

## 2017-02-03 ENCOUNTER — Encounter (HOSPITAL_COMMUNITY): Payer: Self-pay

## 2017-02-03 ENCOUNTER — Emergency Department (HOSPITAL_COMMUNITY): Payer: Self-pay

## 2017-02-03 DIAGNOSIS — J45909 Unspecified asthma, uncomplicated: Secondary | ICD-10-CM | POA: Insufficient documentation

## 2017-02-03 DIAGNOSIS — N83202 Unspecified ovarian cyst, left side: Secondary | ICD-10-CM | POA: Insufficient documentation

## 2017-02-03 DIAGNOSIS — Z79899 Other long term (current) drug therapy: Secondary | ICD-10-CM | POA: Insufficient documentation

## 2017-02-03 DIAGNOSIS — N83201 Unspecified ovarian cyst, right side: Secondary | ICD-10-CM | POA: Insufficient documentation

## 2017-02-03 DIAGNOSIS — M545 Low back pain, unspecified: Secondary | ICD-10-CM

## 2017-02-03 DIAGNOSIS — N200 Calculus of kidney: Secondary | ICD-10-CM | POA: Insufficient documentation

## 2017-02-03 DIAGNOSIS — I10 Essential (primary) hypertension: Secondary | ICD-10-CM | POA: Insufficient documentation

## 2017-02-03 LAB — URINALYSIS, ROUTINE W REFLEX MICROSCOPIC
BILIRUBIN URINE: NEGATIVE
Bacteria, UA: NONE SEEN
GLUCOSE, UA: NEGATIVE mg/dL
Ketones, ur: NEGATIVE mg/dL
Leukocytes, UA: NEGATIVE
Nitrite: NEGATIVE
PROTEIN: 30 mg/dL — AB
Specific Gravity, Urine: 1.027 (ref 1.005–1.030)
pH: 6 (ref 5.0–8.0)

## 2017-02-03 LAB — PREGNANCY, URINE: PREG TEST UR: NEGATIVE

## 2017-02-03 MED ORDER — HYDROCODONE-ACETAMINOPHEN 5-325 MG PO TABS: 1.0000 | ORAL_TABLET | ORAL | 0 refills | Status: DC | PRN

## 2017-02-03 MED ORDER — IBUPROFEN 600 MG PO TABS: 600.0000 mg | ORAL_TABLET | Freq: Four times a day (QID) | ORAL | 0 refills | Status: DC | PRN

## 2017-02-03 MED ORDER — HYDROCODONE-ACETAMINOPHEN 5-325 MG PO TABS
1.0000 | ORAL_TABLET | Freq: Once | ORAL | Status: AC
  Administered 2017-02-03: 1 via ORAL
  Filled 2017-02-03: qty 1

## 2017-02-03 MED ORDER — KETOROLAC TROMETHAMINE 60 MG/2ML IM SOLN
60.0000 mg | Freq: Once | INTRAMUSCULAR | Status: AC
  Administered 2017-02-03: 60 mg via INTRAMUSCULAR
  Filled 2017-02-03: qty 2

## 2017-02-03 NOTE — ED Triage Notes (Signed)
Pt reports that she woke up Friday with left lower back  Pain. No injury. Reports some pressure with urination . Reports pain with ambulation

## 2017-02-03 NOTE — Discharge Instructions (Signed)
As discussed, it is unclear if your symptoms could be a kidney stone which you have passed or if this musculoskeletal low back pain given the fact that your symptoms are worse with movement.  Take medications prescribed do not drive within 4 hours of taking hydrocodone as this will make you drowsy.  Follow-up with your doctor for recheck in one week, sooner if you develop any worsened symptoms including fever or vomiting.

## 2017-02-05 NOTE — ED Provider Notes (Signed)
[facility] DEPT Provider Note   CSN: [id number] Arrival date & time: 02/03/17  1042     History   Chief Complaint Chief Complaint  Patient presents with  . Back Pain    HPI [name] is a 36 y.o. female. With occasional low back pain, presenting with left lower back pain which woke her 2 days ago. Her pain is constant but waxing and waning and worsened with movement. She also endorses dysuria and darker than normal urine. She denies frequency and urgency but has an abnormal pressure sensation with urination. . She has had no fevers, chills, flank pain , vaginal dc and denies hx of kidney stones, but endorses fam hx. She has taken ibuprofen without relief.  The history is provided by the patient.    Past Medical History:  Diagnosis Date  . Allergy    banana/pineapple  . Anemia    post pregnancy  . Anxiety   . Asthma   . Autoimmune disease, not elsewhere classified(279.49) 2013   Non specific- [facility], abnormal Bone Marrow signal  . Breast lump in female 11/28/2012   Has tender round mass at 10-11 0'clock right breast 4 finger breaths from areola will [place]  . Breast mass, right 08/18/2012  . Chlamydia   . Chronic breast pain 05/31/2015  . Dysmenorrhea 03/23/2014  . Dyspareunia 01/05/2014  . Fibroids, intramural 08/24/2015  . GERD (gastroesophageal reflux disease)   . Gonorrhea   . Headache(784.0)   . History of abnormal cervical Pap smear 04/02/2014  . Hypertension   . LLQ abdominal tenderness 05/31/2015  . Menorrhagia 01/05/2014  . Neuromuscular disorder ([place])    lower back and bilat leg pain  . Obesity   . Other and unspecified ovarian cyst 01/12/2014  . Polyclonal gammopathy 07/26/2013   Insignificant  . Scoliosis   . Vaginal irritation 09/30/2014  . Vaginal Pap smear, abnormal     Patient Active Problem List   Diagnosis Date Noted  . Plantar fasciitis 12/16/2015  . Allergic rhinitis 09/14/2015  . Fibroids, intramural 08/24/2015  . LLQ abdominal  tenderness 05/31/2015  . Chronic breast pain 05/31/2015  . Glucose intolerance (impaired glucose tolerance) 03/16/2015  . Knee sprain 03/16/2015  . Acute sinusitis 01/04/2015  . Vaginal irritation 09/30/2014  . History of abnormal cervical Pap smear 04/02/2014  . Dysmenorrhea 03/23/2014  . Other and unspecified ovarian cyst 01/12/2014  . Menorrhagia 01/05/2014  . Dyspareunia 01/05/2014  . Polyclonal gammopathy 07/26/2013  . Dysplasia of cervix, unspecified 03/31/2013  . DUB (dysfunctional uterine bleeding) 03/10/2013  . Breast lump in female 11/28/2012  . Heartburn 04/18/2012  . Insomnia 12/09/2011  . Asthma in adult 04/18/2011  . Hypertension 09/21/2010  . FATIGUE 12/13/2009  . HEADACHE 01/18/2009  . Obesity 11/03/2007  . Generalized anxiety disorder 11/03/2007  . Chronic low back pain 11/03/2007    Past Surgical History:  Procedure Laterality Date  . DILATION AND CURETTAGE OF UTERUS    . TONSILLECTOMY      OB History    Gravida Para Term Preterm AB Living   1 1 1    0 1   SAB TAB Ectopic Multiple Live Births   0       1       Home Medications    Prior to Admission medications   Medication Sig Start Date End Date Taking? Authorizing Provider  albuterol (PROVENTIL HFA;VENTOLIN HFA) 108 (90 Base) MCG/ACT inhaler Inhale 2 puffs into the lungs every 4 (four) hours as needed. 12/16/15  [place], Modena Nunnery, MD  beclomethasone (QVAR) 80 MCG/ACT inhaler Inhale 2 puffs into the lungs 2 (two) times daily. 03/16/15   Alycia Rossetti, MD  cyclobenzaprine (FLEXERIL) 10 MG tablet Take 1 tablet (10 mg total) by mouth 3 (three) times daily as needed for muscle spasms. 02/20/16   Dena Billet B, PA-C  fluticasone (FLONASE) 50 MCG/ACT nasal spray Place 2 sprays into both nostrils daily. Patient taking differently: Place 2 sprays into both nostrils as needed.  06/21/14   Alycia Rossetti, MD  hydrochlorothiazide (HYDRODIURIL) 25 MG tablet Take 1 tablet (25 mg total) by mouth daily.  05/06/15   Alycia Rossetti, MD  HYDROcodone-acetaminophen (NORCO/VICODIN) 5-325 MG tablet Take 1 tablet by mouth every 4 (four) hours as needed. 02/03/17   Evalee Jefferson, PA-C  ibuprofen (ADVIL,MOTRIN) 600 MG tablet Take 1 tablet (600 mg total) by mouth every 6 (six) hours as needed. 02/03/17   Evalee Jefferson, PA-C  metroNIDAZOLE (FLAGYL) 500 MG tablet Take 1 tablet (500 mg total) by mouth 2 (two) times daily. 09/13/16   Lily Kocher, PA-C  norgestimate-ethinyl estradiol (ORTHO-CYCLEN,SPRINTEC,PREVIFEM) 0.25-35 MG-MCG tablet Take 1 tablet by mouth daily. 05/21/16   Jonnie Kind, MD  potassium chloride (K-DUR) 10 MEQ tablet Take 1 tablet (10 mEq total) by mouth daily. 10/13/13   [place], Modena Nunnery, MD  traMADol (ULTRAM) 50 MG tablet Take 1 tablet (50 mg total) by mouth every 6 (six) hours as needed. 10/25/16   Lily Kocher, PA-C  zolpidem (AMBIEN) 10 MG tablet TAKE (1) TABLET BY MOUTH AT BEDTIME. 02/28/16   Alycia Rossetti, MD    Family History Family History  Problem Relation Age of Onset  . Hypertension Mother   . Diabetes Mother   . Heart disease Mother   . Diabetes Father   . Cancer Paternal Aunt        breast  . Hypertension Maternal Grandmother   . Diabetes Son        pre diabetic  . Other Son        overactive bladder    Social History Social History  Substance Use Topics  . Smoking status: Never Smoker  . Smokeless tobacco: Never Used  . Alcohol use No     Allergies   Banana and Pineapple   Review of Systems Review of Systems  Constitutional: Negative for fever.  Respiratory: Negative for shortness of breath.   Cardiovascular: Negative for chest pain and leg swelling.  Gastrointestinal: Negative for abdominal distention, abdominal pain and constipation.  Genitourinary: Positive for dysuria. Negative for difficulty urinating, flank pain, frequency, hematuria, urgency and vaginal discharge.  Musculoskeletal: Positive for back pain. Negative for gait problem and joint  swelling.  Skin: Negative for rash.  Neurological: Negative for weakness and numbness.     Physical Exam Updated Vital Signs BP (!) 145/97   Pulse 81   Temp 98.4 F (36.9 C) (Oral)   Resp 18   Ht 5\' 5"  (1.651 m)   Wt 108.9 kg (240 lb)   LMP 01/21/2017   SpO2 100%   BMI 39.94 kg/m   Physical Exam  Constitutional: She appears well-developed and well-nourished.  HENT:  Head: Normocephalic.  Eyes: Conjunctivae are normal.  Neck: Normal range of motion. Neck supple.  Cardiovascular: Normal rate and intact distal pulses.   Pulses:      Dorsalis pedis pulses are 2+ on the right side, and 2+ on the left side.  Pedal pulses normal.  Pulmonary/Chest: Effort normal.  Abdominal:  Soft. Bowel sounds are normal. She exhibits no distension and no mass. There is no CVA tenderness.  Musculoskeletal: Normal range of motion. She exhibits no edema.       Lumbar back: She exhibits tenderness. She exhibits no bony tenderness, no swelling, no edema and no spasm.  Neurological: She is alert. She has normal strength. She displays no atrophy and no tremor. No sensory deficit. Gait normal.  Reflex Scores:      Patellar reflexes are 2+ on the right side and 2+ on the left side.      Achilles reflexes are 2+ on the right side and 2+ on the left side. No strength deficit noted in hip and knee flexor and extensor muscle groups.  Ankle flexion and extension intact.  Skin: Skin is warm and dry.  Psychiatric: She has a normal mood and affect.  Nursing note and vitals reviewed.    ED Treatments / Results  Labs (all labs ordered are listed, but only abnormal results are displayed) Labs Reviewed  URINALYSIS, ROUTINE W REFLEX MICROSCOPIC - Abnormal; Notable for the following:       Result Value   Color, Urine AMBER (*)    APPearance HAZY (*)    Hgb urine dipstick MODERATE (*)    Protein, ur 30 (*)    Squamous Epithelial / LPF 0-5 (*)    All other components within normal limits  PREGNANCY, URINE     EKG  EKG Interpretation None       Radiology  Ct Renal Stone Study  Result Date: 02/03/2017 CLINICAL DATA:  36 year old female with left lower back pain and discs urea. EXAM: CT ABDOMEN AND PELVIS WITHOUT CONTRAST TECHNIQUE: Multidetector CT imaging of the abdomen and pelvis was performed following the standard protocol without IV contrast. COMPARISON:  Prior CT scan of the abdomen and pelvis May 08, 2016 FINDINGS: Lower chest: The lung bases are clear. Visualized cardiac structures are within normal limits for size. No pericardial effusion. Unremarkable visualized distal thoracic esophagus. Hepatobiliary: Normal hepatic contour and morphology. No discrete hepatic lesions. Normal appearance of the gallbladder. No intra or extrahepatic biliary ductal dilatation. Pancreas: Unremarkable. No pancreatic ductal dilatation or surrounding inflammatory changes. Spleen: Normal in size without focal abnormality. Adrenals/Urinary Tract: Normal adrenal glands. Nephrolithiasis is present bilaterally. There is a solitary punctate stone in the upper to interpolar region of the right kidney. At least 3 stones are present in the left kidney including a 5 mm stone in the lower pole. No evidence of hydronephrosis. No ureteral stone identified. The bladder is decompressed and unremarkable. Stomach/Bowel: No evidence of obstruction or focal bowel wall thickening. Normal appendix in the right lower quadrant. The terminal ileum is unremarkable. Vascular/Lymphatic: Limited evaluation in the absence of intravenous contrast. No evidence of aneurysm, atherosclerotic plaque or suspicious lymphadenopathy. Reproductive: Unremarkable appearance of the uterus. Intermediate attenuation 2.9 cm cyst exophytic from the left ovary. Water attenuation likely simple cyst affiliated with the right ovary measures 3.3 cm. Cysts are new compared to January of 2018. Other: No abdominal wall hernia or abnormality. No abdominopelvic ascites.  Musculoskeletal: No acute or significant osseous findings. IMPRESSION: 1. Bilateral nonobstructing nephrolithiasis. The large stone measures 5 mm in the left lower pole collecting system. 2. Bilateral ovarian cysts. The 2.9 cm left ovarian cyst is intermediate in attenuation which may represent a hemorrhagic cyst, or possibly an endometrioma. Both cysts are new compared to the prior CT scan from January of 2018. Electronically Signed   By: Jacqulynn Cadet  M.D.   On: 02/03/2017 13:16     Procedures Procedures (including critical care time)  Medications Ordered in ED Medications  ketorolac (TORADOL) injection 60 mg (60 mg Intramuscular Given 02/03/17 1214)  HYDROcodone-acetaminophen (NORCO/VICODIN) 5-325 MG per tablet 1 tablet (1 tablet Oral Given 02/03/17 1432)     Initial Impression / Assessment and Plan / ED Course  I have reviewed the triage vital signs and the nursing notes.  Pertinent labs & imaging results that were available during my care of the patient were reviewed by me and considered in my medical decision making (see chart for details).     CT imaging performed given hematuria with signficant bilateral renal stones but no ureteral which were discussed along with bilateral ovarian cysts (pt states this is chronic). It is possible she passed a small stone, especially given hematuria. Pt's pain was improved at time of dc. Discussed need for urologist, referral given. No uti on todays testing. She was prescribed hydrocodone and ibuprofen. Discussed heat tx.  It is possible she has passed a stone. Urine strainer given. Urology referral given. No neuro deficit on exam or by history to suggest emergent or surgical presentation.  Also discussed worsened sx that should prompt immediate re-evaluation including distal weakness, bowel/bladder retention/incontinence.  Pt with low back pain that is triggered by movement but also with hematuria of unclear etiology. Pt with acute on chronic low  back pain vs kidney stone, now resolved. Plan referra with pcp for a recheck this week.         Final Clinical Impressions(s) / ED Diagnoses   Final diagnoses:  Acute left-sided low back pain without sciatica  Kidney stones    New Prescriptions Discharge Medication List as of 02/03/2017  2:21 PM    START taking these medications   Details  HYDROcodone-acetaminophen (NORCO/VICODIN) 5-325 MG tablet Take 1 tablet by mouth every 4 (four) hours as needed., Starting Sun 02/03/2017, Print         Evalee Jefferson, PA-C 02/05/17 2214    Forde Dandy, MD 02/06/17 [id number]

## 2017-03-12 ENCOUNTER — Encounter: Payer: Self-pay | Admitting: *Deleted

## 2017-03-12 ENCOUNTER — Encounter: Payer: Self-pay | Admitting: Adult Health

## 2017-03-12 ENCOUNTER — Ambulatory Visit: Payer: Medicaid Other | Admitting: Adult Health

## 2017-03-12 ENCOUNTER — Other Ambulatory Visit: Payer: Self-pay

## 2017-03-12 VITALS — BP 124/86 | HR 89 | Ht 65.0 in | Wt 256.0 lb

## 2017-03-12 DIAGNOSIS — R42 Dizziness and giddiness: Secondary | ICD-10-CM | POA: Diagnosis not present

## 2017-03-12 DIAGNOSIS — N939 Abnormal uterine and vaginal bleeding, unspecified: Secondary | ICD-10-CM | POA: Insufficient documentation

## 2017-03-12 DIAGNOSIS — N83201 Unspecified ovarian cyst, right side: Secondary | ICD-10-CM

## 2017-03-12 DIAGNOSIS — Z113 Encounter for screening for infections with a predominantly sexual mode of transmission: Secondary | ICD-10-CM

## 2017-03-12 DIAGNOSIS — N83202 Unspecified ovarian cyst, left side: Secondary | ICD-10-CM

## 2017-03-12 NOTE — Progress Notes (Signed)
Subjective:     Patient ID: [name], female   DOB: 1981-03-24, 36 y.o.   MRN: [id number]  HPI [name] is a 36 year old black female in complaining of having had a period for 1 month on Sprintec, she did not miss any pills or have new meds.Bleeding stopped Saturday. Had some headaches and felt dizzy at times. Had CT in October for kidney stones and had bilateral ovarian cysts.  Review of Systems Had bleeding for 1 month +headaches with bleeding and dizzy at times  Reviewed past medical,surgical, social and family history. Reviewed medications and allergies.     Objective:   Physical Exam BP 124/86 (BP Location: Right Arm, Patient Position: Sitting, Cuff Size: Large)   Pulse 89   Ht 5\' 5"  (1.651 m)   Wt 256 lb (116.1 kg)   LMP 02/09/2017   BMI 42.60 kg/m  Skin warm and dry.Pelvic: external genitalia is normal in appearance no lesions, vagina: scant discharge without odor,urethra has no lesions or masses noted, cervix:smooth and bulbous, uterus: normal size, shape and contour, non tender, no masses felt, adnexa: no masses or tenderness noted. Bladder is non tender and no masses felt.  GC/CHL obtained.  Will check labs and get [place].    Assessment:     1. Abnormal uterine bleeding (AUB)   2. Screening examination for STD (sexually transmitted disease)   3. Dizzy   4. Cysts of both ovaries       Plan:     Check CBC,CMP and TSH GC/CHL sent Return in 6 days for GYN [place] Will talk when results in  Continue sprintec

## 2017-03-13 LAB — CBC
Hematocrit: 43.4 % (ref 34.0–46.6)
Hemoglobin: 14.1 g/dL (ref 11.1–15.9)
MCH: 30.1 pg (ref 26.6–33.0)
MCHC: 32.5 g/dL (ref 31.5–35.7)
MCV: 93 fL (ref 79–97)
PLATELETS: 329 10*3/uL (ref 150–379)
RBC: 4.69 x10E6/uL (ref 3.77–5.28)
RDW: 13.5 % (ref 12.3–15.4)
WBC: 7.5 10*3/uL (ref 3.4–10.8)

## 2017-03-13 LAB — COMPREHENSIVE METABOLIC PANEL
A/G RATIO: 1.5 (ref 1.2–2.2)
ALT: 5 IU/L (ref 0–32)
AST: 12 IU/L (ref 0–40)
Albumin: 4.1 g/dL (ref 3.5–5.5)
Alkaline Phosphatase: 83 IU/L (ref 39–117)
BUN / CREAT RATIO: 7 — AB (ref 9–23)
BUN: 5 mg/dL — ABNORMAL LOW (ref 6–20)
Bilirubin Total: 0.5 mg/dL (ref 0.0–1.2)
CALCIUM: 9.3 mg/dL (ref 8.7–10.2)
CO2: 24 mmol/L (ref 20–29)
Chloride: 102 mmol/L (ref 96–106)
Creatinine, Ser: 0.67 mg/dL (ref 0.57–1.00)
GFR calc non Af Amer: 115 mL/min/{1.73_m2} (ref >59)
GFR, EST AFRICAN AMERICAN: 133 mL/min/{1.73_m2} (ref >59)
GLUCOSE: 85 mg/dL (ref 65–99)
Globulin, Total: 2.8 g/dL (ref 1.5–4.5)
POTASSIUM: 4.4 mmol/L (ref 3.5–5.2)
SODIUM: 140 mmol/L (ref 134–144)
Total Protein: 6.9 g/dL (ref 6.0–8.5)

## 2017-03-13 LAB — TSH: TSH: 0.613 u[IU]/mL (ref 0.450–4.500)

## 2017-03-14 LAB — GC/CHLAMYDIA PROBE AMP
Chlamydia trachomatis, NAA: NEGATIVE
Neisseria gonorrhoeae by PCR: NEGATIVE

## 2017-03-18 ENCOUNTER — Ambulatory Visit (INDEPENDENT_AMBULATORY_CARE_PROVIDER_SITE_OTHER): Payer: Medicaid Other

## 2017-03-18 ENCOUNTER — Other Ambulatory Visit: Payer: Self-pay | Admitting: Adult Health

## 2017-03-18 DIAGNOSIS — N83201 Unspecified ovarian cyst, right side: Secondary | ICD-10-CM

## 2017-03-18 DIAGNOSIS — N939 Abnormal uterine and vaginal bleeding, unspecified: Secondary | ICD-10-CM | POA: Diagnosis not present

## 2017-03-18 DIAGNOSIS — N83202 Unspecified ovarian cyst, left side: Secondary | ICD-10-CM

## 2017-03-18 NOTE — Progress Notes (Signed)
PELVIS TA/TV: anteverted uterus w/ a small posterior intramural fibroid 1.2 x .8 x 1 cm,normal left ovary,simple right ovarian cyst 2.6 x 2.6 x 2.4 cm and a 4.2 x 3.2 x 3.8 cm hemorrhagic ovarian cyst w/through transmission, no internal vascularity,normal arterial and venous flow in ovary,EEC 4.5 cm,prominent dilated vessels left adnexa,small amount of simple cu de sac fluid,no pain during ultrasound

## 2017-03-19 ENCOUNTER — Telehealth: Payer: Self-pay | Admitting: Adult Health

## 2017-03-19 MED ORDER — NORETHIN-ETH ESTRADIOL-FE 0.4-35 MG-MCG PO CHEW: 1.0000 | CHEWABLE_TABLET | Freq: Every day | ORAL | 11 refills | Status: DC

## 2017-03-19 NOTE — Telephone Encounter (Signed)
Pt aware US shows small fibroid and simple cyst right ovary and normal left ovary, will change OCs, use condoms for 1 pack

## 2017-04-01 ENCOUNTER — Telehealth: Payer: Self-pay | Admitting: *Deleted

## 2017-04-01 NOTE — Telephone Encounter (Signed)
Spoke with pt letting her know birth control pills was sent to pharmacy 11/20. Pt voiced understanding. [place]

## 2017-04-29 ENCOUNTER — Ambulatory Visit: Payer: Medicaid Other | Admitting: Physician Assistant

## 2017-04-29 ENCOUNTER — Encounter: Payer: Self-pay | Admitting: Physician Assistant

## 2017-04-29 VITALS — BP 118/86 | HR 93 | Temp 98.3°F | Resp 14 | Wt 262.0 lb

## 2017-04-29 DIAGNOSIS — J0100 Acute maxillary sinusitis, unspecified: Secondary | ICD-10-CM

## 2017-04-29 DIAGNOSIS — H6692 Otitis media, unspecified, left ear: Secondary | ICD-10-CM

## 2017-04-29 MED ORDER — PREDNISONE 20 MG PO TABS: ORAL_TABLET | ORAL | 0 refills | Status: DC

## 2017-04-29 MED ORDER — AMOXICILLIN-POT CLAVULANATE 875-125 MG PO TABS: 1.0000 | ORAL_TABLET | Freq: Two times a day (BID) | ORAL | 0 refills | Status: AC

## 2017-04-29 NOTE — Progress Notes (Signed)
Patient ID: [name] MRN: [id number], DOB: Jun 10, 1980, 36 y.o. Date of Encounter: 04/29/2017, 4:36 PM    Chief Complaint:  Chief Complaint  Patient presents with  . left side jaw and ear pain    x2 weeks     HPI: 36 y.o. year old female presents with above.   She reports that she has been feeling discomfort in her left ear off and on intermittently for the past 2 weeks.  States that last week the left side of her face hurt and points to left maxillary sinus region.  Since that she has not been blowing any mucus out of her nose but did have that sinus headache.  States that she has felt left ear pain off and on especially when she lays down to sleep.  Throat has felt a little irritated but no significant sore throat.  No known fevers or chills.  No cough or chest congestion.     Home Meds:   Outpatient Medications Prior to Visit  Medication Sig Dispense Refill  . albuterol (PROVENTIL HFA;VENTOLIN HFA) 108 (90 Base) MCG/ACT inhaler Inhale 2 puffs into the lungs every 4 (four) hours as needed. 6.7 g 2  . beclomethasone (QVAR) 80 MCG/ACT inhaler Inhale 2 puffs into the lungs 2 (two) times daily. 1 Inhaler 12  . cyclobenzaprine (FLEXERIL) 10 MG tablet Take 1 tablet (10 mg total) by mouth 3 (three) times daily as needed for muscle spasms. 30 tablet 0  . fluticasone (FLONASE) 50 MCG/ACT nasal spray Place 2 sprays into both nostrils daily. (Patient taking differently: Place 2 sprays into both nostrils as needed. ) 16 g 1  . hydrochlorothiazide (HYDRODIURIL) 25 MG tablet Take 1 tablet (25 mg total) by mouth daily. 30 tablet 6  . ibuprofen (ADVIL,MOTRIN) 600 MG tablet Take 1 tablet (600 mg total) by mouth every 6 (six) hours as needed. 30 tablet 0  . Norethin-Eth Estradiol-Fe [facility] FE,WYMZYA Orrin Brigham) 0.4-35 MG-MCG tablet Chew 1 tablet by mouth daily. 1 Package 11  . potassium chloride (K-DUR) 10 MEQ tablet Take 1 tablet (10 mEq total) by mouth daily. 30 tablet 5    . zolpidem (AMBIEN) 10 MG tablet TAKE (1) TABLET BY MOUTH AT BEDTIME. 15 tablet 2   No facility-administered medications prior to visit.     Allergies:  Allergies  Allergen Reactions  . Banana Rash  . Pineapple Rash      Review of Systems: See HPI for pertinent ROS. All other ROS negative.    Physical Exam: Blood pressure 118/86, pulse 93, temperature 98.3 F (36.8 C), temperature source Oral, resp. rate 14, weight 118.8 kg (262 lb), SpO2 98 %., Body mass index is 43.6 kg/m. General:  Obese AAF. Appears in no acute distress. HEENT: Normocephalic, atraumatic, eyes without discharge, sclera non-icteric, nares are without discharge.  Left maxillary sinus region is tender with percussion.  No tenderness with percussion to right maxillary sinus region.  Bilateral auditory canals clear,  Right TM is clear, normal. Left TM is dull, retracted.  Oral cavity moist, posterior pharynx without exudate, erythema, peritonsillar abscess. Neck: Supple. No thyromegaly. No lymphadenopathy. Lungs: Clear bilaterally to auscultation without wheezes, rales, or rhonchi. Breathing is unlabored. Heart: Regular rhythm. No murmurs, rubs, or gallops. Msk:  Strength and tone normal for age. Extremities/Skin: Warm and dry. Neuro: Alert and oriented X 3. Moves all extremities spontaneously. Gait is normal. CNII-XII grossly in tact. Psych:  Responds to questions appropriately with a normal affect.  ASSESSMENT AND PLAN:  36 y.o. year old female with  1. Acute non-recurrent maxillary sinusitis She is to take Augmentin and prednisone as directed.  Follow-up if symptoms do not resolve after completion of these. - amoxicillin-clavulanate (AUGMENTIN) 875-125 MG tablet; Take 1 tablet by mouth every 12 (twelve) hours for 7 days.  Dispense: 14 tablet; Refill: 0 - predniSONE (DELTASONE) 20 MG tablet; Take 3 daily for 2 days, then 2 daily for 2 days, then 1 daily for 2 days.  Dispense: 12 tablet; Refill: 0  2. Left  otitis media, unspecified otitis media type She is to take Augmentin and prednisone as directed.  Follow-up if symptoms do not resolve after completion of these. - amoxicillin-clavulanate (AUGMENTIN) 875-125 MG tablet; Take 1 tablet by mouth every 12 (twelve) hours for 7 days.  Dispense: 14 tablet; Refill: 0 - predniSONE (DELTASONE) 20 MG tablet; Take 3 daily for 2 days, then 2 daily for 2 days, then 1 daily for 2 days.  Dispense: 12 tablet; Refill: 0   Signed, [address] [place], [place], [facility] 04/29/2017 4:36 PM

## 2017-05-17 ENCOUNTER — Ambulatory Visit: Payer: Medicaid Other | Admitting: Family Medicine

## 2017-05-17 ENCOUNTER — Encounter: Payer: Self-pay | Admitting: Family Medicine

## 2017-05-17 ENCOUNTER — Other Ambulatory Visit: Payer: Self-pay

## 2017-05-17 VITALS — BP 116/72 | HR 98 | Temp 98.5°F | Resp 16 | Ht 65.0 in | Wt 260.0 lb

## 2017-05-17 DIAGNOSIS — J4541 Moderate persistent asthma with (acute) exacerbation: Secondary | ICD-10-CM

## 2017-05-17 DIAGNOSIS — J0100 Acute maxillary sinusitis, unspecified: Secondary | ICD-10-CM

## 2017-05-17 MED ORDER — FLUTICASONE FUROATE-VILANTEROL 100-25 MCG/INH IN AEPB: 1.0000 | INHALATION_SPRAY | Freq: Every day | RESPIRATORY_TRACT | 0 refills | Status: DC

## 2017-05-17 MED ORDER — AZITHROMYCIN 250 MG PO TABS: ORAL_TABLET | ORAL | 0 refills | Status: DC

## 2017-05-17 MED ORDER — METHYLPREDNISOLONE ACETATE 80 MG/ML IJ SUSP
80.0000 mg | Freq: Once | INTRAMUSCULAR | Status: AC
  Administered 2017-05-17: 80 mg via INTRAMUSCULAR

## 2017-05-17 MED ORDER — ALBUTEROL SULFATE (2.5 MG/3ML) 0.083% IN NEBU
2.5000 mg | INHALATION_SOLUTION | Freq: Once | RESPIRATORY_TRACT | Status: AC
  Administered 2017-05-17: 2.5 mg via RESPIRATORY_TRACT

## 2017-05-17 MED ORDER — PREDNISONE 10 MG PO TABS: ORAL_TABLET | ORAL | 0 refills | Status: DC

## 2017-05-17 NOTE — Patient Instructions (Addendum)
Take prednisone taper Try the new inhaler Breo- take 1 puff a day, instead of Qvar Take antibiotics Schedule PHYSICAL

## 2017-05-17 NOTE — Assessment & Plan Note (Signed)
Recurrent sinusitis with asthma exacerbation She has not been using preventative inhaler correctly Will try her on once daily Breo instead, given samples Prednisone taper and zpak given Sample of xyzal antihistamine and Norel AD given Use albuterol or neb as needed

## 2017-05-17 NOTE — Progress Notes (Signed)
   Subjective:    Patient ID: [name], female    DOB: [birth year], 37 y.o.   MRN: [id number]  Patient presents for Illness (x3 days- nasal congstion, sinus pressure, chest congestion, SOB, nonproductive cough, low grade fever)   Pt here with sinus pressure congestion,Cough has mild production , SOB, wheeze for past 3 days, Has history of underlying asthma on Qvar but uses once a day.   She was seen on 12/31 dx with sinusitis and left OM, given augmentin and prednosone taper. She was doing well until 2 days ago. Has recurrent head congestion , started feeling tight in chest yesterday AM. Started using nebulizer. Felt like she had actual attack last night at 12pm  Had fever Wed 100.54F only. Back is achy,   Using vicks cough and cough medicine and nasal spray      Review Of Systems:  GEN- denies fatigue, +fever, weight loss,weakness, recent illness HEENT- denies eye drainage, change in vision, nasal discharge, CVS- denies chest pain, palpitations RESP-+SOB, +cough, +wheeze ABD- denies N/V, change in stools, abd pain GU- denies dysuria, hematuria, dribbling, incontinence MSK- denies joint pain, muscle aches, injury Neuro- denies headache, dizziness, syncope, seizure activity       Objective:    BP 116/72   Pulse 98   Temp 98.5 F (36.9 C)   Resp 16   Ht 5\' 5"  (1.651 m)   Wt 260 lb (117.9 kg)   SpO2 100%   BMI 43.27 kg/m  GEN- NAD, alert and oriented x3 HEENT- PERRL, EOMI, non injected sclera, pink conjunctiva, MMM, oropharynx clear  TM clear bilat no effusion,  + maxillary sinus tenderness, inflammed turbinates,  Nasal drainage  Neck- Supple, no LAD CVS- RRR, no murmur RESP-decreased air movement at bases, no wheeze, no rales  EXT- No edema Pulses- Radial 2+    Peak Flow 350, 300, 300      Assessment & Plan:     Depo Medrol 80mg  IM given Albuterol 2.5mg  neb    Improved Air movement , normal WOB  Problem List Items Addressed This Visit      Unprioritized   Acute sinusitis    Recurrent sinusitis with asthma exacerbation She has not been using preventative inhaler correctly Will try her on once daily Breo instead, given samples Prednisone taper and zpak given Sample of xyzal antihistamine and Norel AD given Use albuterol or neb as needed      Relevant Medications   predniSONE (DELTASONE) 10 MG tablet   azithromycin (ZITHROMAX) 250 MG tablet    Other Visit Diagnoses    Moderate persistent asthma with exacerbation    -  Primary   Relevant Medications   predniSONE (DELTASONE) 10 MG tablet   fluticasone furoate-vilanterol (BREO ELLIPTA) 100-25 MCG/INH AEPB      Note: This dictation was prepared with Dragon dictation along with smaller phrase technology. Any transcriptional errors that result from this process are unintentional.

## 2017-08-05 ENCOUNTER — Encounter: Payer: Self-pay | Admitting: Family Medicine

## 2017-08-05 ENCOUNTER — Other Ambulatory Visit: Payer: Self-pay

## 2017-08-05 ENCOUNTER — Ambulatory Visit (INDEPENDENT_AMBULATORY_CARE_PROVIDER_SITE_OTHER): Payer: Medicaid Other | Admitting: Family Medicine

## 2017-08-05 VITALS — BP 118/64 | HR 88 | Temp 98.9°F | Resp 12 | Ht 65.0 in | Wt 264.0 lb

## 2017-08-05 DIAGNOSIS — M25561 Pain in right knee: Secondary | ICD-10-CM

## 2017-08-05 DIAGNOSIS — N920 Excessive and frequent menstruation with regular cycle: Secondary | ICD-10-CM

## 2017-08-05 DIAGNOSIS — Z0000 Encounter for general adult medical examination without abnormal findings: Secondary | ICD-10-CM | POA: Diagnosis not present

## 2017-08-05 LAB — COMPREHENSIVE METABOLIC PANEL
AG Ratio: 1.4 (calc) (ref 1.0–2.5)
ALT: 5 U/L — ABNORMAL LOW (ref 6–29)
AST: 9 U/L — ABNORMAL LOW (ref 10–30)
Albumin: 3.8 g/dL (ref 3.6–5.1)
Alkaline phosphatase (APISO): 67 U/L (ref 33–115)
BILIRUBIN TOTAL: 0.7 mg/dL (ref 0.2–1.2)
BUN: 10 mg/dL (ref 7–25)
CALCIUM: 8.9 mg/dL (ref 8.6–10.2)
CHLORIDE: 104 mmol/L (ref 98–110)
CO2: 27 mmol/L (ref 20–32)
Creat: 0.73 mg/dL (ref 0.50–1.10)
GLOBULIN: 2.8 g/dL (ref 1.9–3.7)
GLUCOSE: 94 mg/dL (ref 65–99)
POTASSIUM: 4.1 mmol/L (ref 3.5–5.3)
SODIUM: 139 mmol/L (ref 135–146)
TOTAL PROTEIN: 6.6 g/dL (ref 6.1–8.1)

## 2017-08-05 LAB — CBC WITH DIFFERENTIAL/PLATELET
BASOS ABS: 104 {cells}/uL (ref 0–200)
Basophils Relative: 1.5 %
EOS ABS: 476 {cells}/uL (ref 15–500)
Eosinophils Relative: 6.9 %
HCT: 38 % (ref 35.0–45.0)
HEMOGLOBIN: 12.9 g/dL (ref 11.7–15.5)
Lymphs Abs: 1911 cells/uL (ref 850–3900)
MCH: 30.6 pg (ref 27.0–33.0)
MCHC: 33.9 g/dL (ref 32.0–36.0)
MCV: 90 fL (ref 80.0–100.0)
MONOS PCT: 5.1 %
MPV: 10.7 fL (ref 7.5–12.5)
NEUTROS PCT: 58.8 %
Neutro Abs: 4057 cells/uL (ref 1500–7800)
Platelets: 341 10*3/uL (ref 140–400)
RBC: 4.22 10*6/uL (ref 3.80–5.10)
RDW: 12 % (ref 11.0–15.0)
TOTAL LYMPHOCYTE: 27.7 %
WBC mixed population: 352 cells/uL (ref 200–950)
WBC: 6.9 10*3/uL (ref 3.8–10.8)

## 2017-08-05 LAB — LIPID PANEL
CHOL/HDL RATIO: 2.3 (calc) (ref <5.0)
CHOLESTEROL: 149 mg/dL (ref <200)
HDL: 65 mg/dL (ref >50)
LDL Cholesterol (Calc): 68 mg/dL (calc)
Non-HDL Cholesterol (Calc): 84 mg/dL (calc) (ref <130)
Triglycerides: 75 mg/dL (ref <150)

## 2017-08-05 MED ORDER — POTASSIUM CHLORIDE ER 10 MEQ PO TBCR: 10.0000 meq | EXTENDED_RELEASE_TABLET | Freq: Every day | ORAL | 5 refills | Status: DC

## 2017-08-05 MED ORDER — ALBUTEROL SULFATE HFA 108 (90 BASE) MCG/ACT IN AERS: 2.0000 | INHALATION_SPRAY | RESPIRATORY_TRACT | 2 refills | Status: DC | PRN

## 2017-08-05 MED ORDER — HYDROCHLOROTHIAZIDE 25 MG PO TABS: 25.0000 mg | ORAL_TABLET | Freq: Every day | ORAL | 6 refills | Status: DC

## 2017-08-05 MED ORDER — IBUPROFEN 600 MG PO TABS: 600.0000 mg | ORAL_TABLET | Freq: Four times a day (QID) | ORAL | 1 refills | Status: DC | PRN

## 2017-08-05 MED ORDER — FLUTICASONE FUROATE-VILANTEROL 100-25 MCG/INH IN AEPB: 1.0000 | INHALATION_SPRAY | Freq: Every day | RESPIRATORY_TRACT | 2 refills | Status: DC

## 2017-08-05 MED ORDER — LEVOCETIRIZINE DIHYDROCHLORIDE 5 MG PO TABS: 5.0000 mg | ORAL_TABLET | Freq: Every evening | ORAL | 6 refills | Status: DC

## 2017-08-05 MED ORDER — FLUTICASONE PROPIONATE 50 MCG/ACT NA SUSP: 2.0000 | NASAL | 6 refills | Status: DC | PRN

## 2017-08-05 NOTE — Patient Instructions (Signed)
Get xray [facility]  I recommend eye visit once a year I recommend dental visit every 6 months Goal is to  Exercise 30 minutes 5 days a week We will call  with lab results  F/u GYN F/U 6 months

## 2017-08-05 NOTE — Progress Notes (Signed)
Subjective:    Patient ID: [name], female    DOB: 1981-02-12, 37 y.o.   MRN: [id number]  Patient presents for CPE (is fasting); R Knee Pain (x7 months- worsening pain); and Irregular Menses (has been on menses since 3/10- is seeing gyn on Thursday, but is feeling weak and dizzy)   Pt here for CPE,  Medications reviewed     Right knee pain for 6-7 months,sometimes in front, sometimes in the back , has had swelling. Uses ICE/HEAT and elevates when  Swelling,used lidocaine . Does not remember any particular injury, hurts mostly when standing up at work, works in [employer] department in [place] , stands in 1 spot    GYN-  Has been bleeding since March 10th, on a OCP currently , has appt Thursday, has felt dizzy and weak at times  PAP Smear UTD    Immunizations- UTD   Obesity- was down to 249lbs per pt at home, has cut out soda, and snacks, mostly water and lemondae   Last OV was  260lbs    Insomnnia- amien as needed   Asthma- using Breo, this is better than Qvar / xyzal helped as wel             Review Of Systems:  GEN- denies fatigue, fever, weight loss,weakness, recent illness HEENT- denies eye drainage, change in vision, nasal discharge, CVS- denies chest pain, palpitations RESP- denies SOB, cough, wheeze ABD- denies N/V, change in stools, abd pain GU- denies dysuria, hematuria, dribbling, incontinence MSK- denies joint pain, muscle aches, injury Neuro- denies headache, dizziness, syncope, seizure activity       Objective:    BP 118/64   Pulse 88   Temp 98.9 F (37.2 C) (Oral)   Resp 12   Ht 5\' 5"  (1.651 m)   Wt 264 lb (119.7 kg)   LMP 07/07/2017 (Approximate) Comment: iregular  SpO2 96%   BMI 43.93 kg/m  GEN- NAD, alert and oriented x3 HEENT- PERRL, EOMI, non injected sclera, pink conjunctiva, MMM, oropharynx clear Neck- Supple, no thyromegaly CVS- RRR, no murmur RESP-CTAB ABD-NABS,soft,NT,ND MSK- bilat knee, no effusion, pain with ROM  right knee, TTP ant knee, no crepitus, ligaments grossly in tact  EXT- No edema Pulses- Radial, DP- 2+        Assessment & Plan:      Problem List Items Addressed This Visit      Unprioritized   Morbid obesity ([place])    Any weight loss will benefit her chronic back pain and joint pain , needs weight loss of at least 80 pounds      Menorrhagia    F/u GYN Thursday on OCP Check Hb feeling weak since heavy menses        Other Visit Diagnoses    Routine general medical examination at a health care facility    -  Primary   CPE done, immunizations UTD, fasting labs, discussed diet and need for significant weight loss   Relevant Orders   Comprehensive metabolic panel   CBC with Differential/Platelet   Lipid panel   Acute pain of right knee       obtain xray of knee, has history of lots of "joint complaints" neg work in past. can use ibuprofen for knee and menses   Relevant Orders   DG Knee Complete 4 Views Right      Note: This dictation was prepared with Dragon dictation along with smaller phrase technology. Any transcriptional errors that result from this process  are unintentional.

## 2017-08-05 NOTE — Assessment & Plan Note (Signed)
F/u GYN Thursday on OCP Check Hb feeling weak since heavy menses

## 2017-08-05 NOTE — Assessment & Plan Note (Signed)
Any weight loss will benefit her chronic back pain and joint pain , needs weight loss of at least 80 pounds

## 2017-08-07 ENCOUNTER — Encounter: Payer: Self-pay | Admitting: *Deleted

## 2017-08-08 ENCOUNTER — Ambulatory Visit: Payer: Medicaid Other | Admitting: Adult Health

## 2017-08-08 ENCOUNTER — Encounter: Payer: Self-pay | Admitting: Adult Health

## 2017-08-08 VITALS — BP 140/94 | HR 89 | Ht 65.0 in | Wt 268.0 lb

## 2017-08-08 DIAGNOSIS — Z3202 Encounter for pregnancy test, result negative: Secondary | ICD-10-CM | POA: Diagnosis not present

## 2017-08-08 DIAGNOSIS — N939 Abnormal uterine and vaginal bleeding, unspecified: Secondary | ICD-10-CM

## 2017-08-08 LAB — POCT URINE PREGNANCY: Preg Test, Ur: NEGATIVE

## 2017-08-08 NOTE — Progress Notes (Signed)
Subjective:     Patient ID: [name], female   DOB: [birth year], 37 y.o.   MRN: [id number]  HPI [name] is a 37 year old black female in complaining of bleeding for on month, even while on OCs.She had labs at PCP normal.No new partners.  Review of Systems Bleeding for 1 month, finally stopped Reviewed past medical,surgical, social and family history. Reviewed medications and allergies.     Objective:   Physical Exam BP (!) 140/94 (BP Location: Left Arm, Patient Position: Sitting, Cuff Size: Large)   Pulse 89   Ht 5\' 5"  (1.651 m)   Wt 268 lb (121.6 kg)   LMP 07/06/2017   BMI 44.60 kg/m   UPT negative. Skin warm and dry.Pelvic: external genitalia is normal in appearance no lesions, vagina: scant discharge without odor,urethra has no lesions or masses noted, cervix:smooth and bulbous, uterus: normal size, shape and contour, non tender, no masses felt, adnexa: no masses,slight LLQ  tenderness noted. Bladder is non tender and no masses. GC/CHL obtained.    Discussed could change OCs, or IUD or even depo, she will continue on this pill, her partner may want another child, but her son is 35.  Assessment:     1. Abnormal uterine bleeding (AUB)   2. Pregnancy examination or test, negative result       Plan:     Continue OCs GC/CHL sent Call if bleeding returns F/U prn

## 2017-08-10 LAB — GC/CHLAMYDIA PROBE AMP
Chlamydia trachomatis, NAA: NEGATIVE
Neisseria gonorrhoeae by PCR: NEGATIVE

## 2017-08-13 ENCOUNTER — Ambulatory Visit (HOSPITAL_COMMUNITY)
Admission: RE | Admit: 2017-08-13 | Discharge: 2017-08-13 | Disposition: A | Discharge status: Home / Self Care | Payer: Medicaid Other | Source: Ambulatory Visit | Attending: Family Medicine | Admitting: Family Medicine

## 2017-08-13 DIAGNOSIS — M7989 Other specified soft tissue disorders: Secondary | ICD-10-CM | POA: Diagnosis present

## 2017-08-13 DIAGNOSIS — M25561 Pain in right knee: Secondary | ICD-10-CM | POA: Insufficient documentation

## 2017-08-27 ENCOUNTER — Ambulatory Visit: Payer: Medicaid Other | Admitting: Family Medicine

## 2017-08-27 ENCOUNTER — Encounter: Payer: Self-pay | Admitting: Family Medicine

## 2017-08-27 ENCOUNTER — Other Ambulatory Visit: Payer: Self-pay

## 2017-08-27 VITALS — BP 132/80 | HR 90 | Temp 98.3°F | Resp 12 | Ht 65.0 in | Wt 270.0 lb

## 2017-08-27 DIAGNOSIS — G8929 Other chronic pain: Secondary | ICD-10-CM | POA: Diagnosis not present

## 2017-08-27 DIAGNOSIS — M25561 Pain in right knee: Secondary | ICD-10-CM

## 2017-08-27 DIAGNOSIS — J4520 Mild intermittent asthma, uncomplicated: Secondary | ICD-10-CM

## 2017-08-27 MED ORDER — MONTELUKAST SODIUM 10 MG PO TABS: 10.0000 mg | ORAL_TABLET | Freq: Every day | ORAL | 6 refills | Status: DC

## 2017-08-27 MED ORDER — PREDNISONE 20 MG PO TABS: 40.0000 mg | ORAL_TABLET | Freq: Every day | ORAL | 0 refills | Status: DC

## 2017-08-27 NOTE — Patient Instructions (Addendum)
Referral to orthopedics  Prednisone given  Start singulair Continue the breo  F/U as needed

## 2017-08-27 NOTE — Progress Notes (Signed)
   Subjective:    Patient ID: [name], female    DOB: 03/24/1981, 37 y.o.   MRN: [id number]  Patient presents for R Knee Pain (reports that she has been compliant with conservative therapy, but pain is worsening) and Asthma (reports that asthma Sx have worsened D/T pollen) Patient here with persistent knee pain.  She was seen about 3 weeks ago at that time states she has had knee pain for months.  Knee was stable on exam.  X-ray was obtained which was normal.  Treated for more patellofemoral syndrome discussed stretching ice anti-inflammatories. He works at [employer] for [employer].  States that she stands all day long. She is also morbidly obese contnues to have daily knee pain  trying to exercise it did help and taking ibuprofen     Asthma- using Breo, given xyzal as well, had 2 asthma attacks this weekend, had to use her inhaler   singulair  Use nebulizer this morning   coughing a lot has soreness in chest, no fever       Review Of Systems:  GEN- denies fatigue, fever, weight loss,weakness, recent illness HEENT- denies eye drainage, change in vision, nasal discharge, CVS- denies chest pain, palpitations RESP- denies SOB, cough, wheeze MSK- + joint pain, muscle aches, injury Neuro- denies headache, dizziness, syncope, seizure activity       Objective:    BP 132/80   Pulse 90   Temp 98.3 F (36.8 C) (Oral)   Resp 12   Ht 5\' 5"  (1.651 m)   Wt 270 lb (122.5 kg)   SpO2 98%   BMI 44.93 kg/m  GEN- NAD, alert and oriented x3 HEENT- PERRL, EOMI, non icteric pink conjunctiva, Nares clear rhinorrhea, no maxillary sinus tenderness  CVS- RRR,no murmur RESP- CTAB, no wheeze, normal wob MSK- bilat knee, no effusion, pain with ROM right knee, , no crepitus, ligaments grossly in tact  EXT- No edema Pulses- Radial, DP- 2+        Assessment & Plan:      Problem List Items Addressed This Visit      Unprioritized   Asthma in adult - Primary    Continue breo,  add singulair Given prednisone, had a few exacerbations this weekend Prn albuterol       Relevant Medications   predniSONE (DELTASONE) 20 MG tablet   montelukast (SINGULAIR) 10 MG tablet    Other Visit Diagnoses    Chronic pain of right knee       normal xray, habitus contributing to pain, no known injury, referral to ortho to evaluate, has done NSAID   Relevant Orders   AMB referral to orthopedics      Note: This dictation was prepared with Dragon dictation along with smaller phrase technology. Any transcriptional errors that result from this process are unintentional.

## 2017-08-27 NOTE — Assessment & Plan Note (Signed)
Continue breo, add singulair Given prednisone, had a few exacerbations this weekend Prn albuterol

## 2017-10-02 ENCOUNTER — Ambulatory Visit: Payer: Medicaid Other | Admitting: Orthopedic Surgery

## 2017-10-02 ENCOUNTER — Encounter: Payer: Self-pay | Admitting: Orthopedic Surgery

## 2017-10-02 ENCOUNTER — Other Ambulatory Visit: Payer: Medicaid Other

## 2017-10-02 VITALS — BP 138/96 | HR 96 | Ht 65.0 in | Wt 266.0 lb

## 2017-10-02 DIAGNOSIS — M25561 Pain in right knee: Secondary | ICD-10-CM | POA: Diagnosis not present

## 2017-10-02 DIAGNOSIS — G8929 Other chronic pain: Secondary | ICD-10-CM

## 2017-10-02 MED ORDER — NAPROXEN 500 MG PO TABS: 500.0000 mg | ORAL_TABLET | Freq: Two times a day (BID) | ORAL | 2 refills | Status: DC

## 2017-10-02 NOTE — Patient Instructions (Signed)
Start physical therapy  Use ice for your knee  New prescription called in for naproxen 500 mg twice a day

## 2017-10-02 NOTE — Progress Notes (Signed)
NEW PATIENT OFFICE VISIT   Chief Complaint  Patient presents with  . Knee Pain    right knee pain x 1 year      MEDICAL DECISION SECTION  xrays ordered? no  My independent reading of xrays:  X-rays were done at the hospital with few weeks back.  4 views of the knee show no arthritis changes in the knee no acute fracture  The report indicates the same FINDINGS: No evidence of fracture, dislocation, or joint effusion. No evidence of arthropathy or other focal bone abnormality. Soft tissues are unremarkable.   IMPRESSION: Negative.     Electronically Signed   By: Monte Fantasia M.D.   On: 08/13/2017 11:35   Encounter Diagnoses  Name Primary?  . Pain of right thumb   . Chronic pain of right knee Yes     PLAN:  The patient has 1 year of knee pain no trauma.  She has not had any physical therapy.  She does take over-the-counter ibuprofen.  Recommend Naprosyn and physical therapy return after 6 to 8 weeks and if no improvement we can get an MRI  I am concerned that she has a history of lower back pain still has some back tenderness and some of her pain is at night and extension when she is recumbent and in the posterior aspect of the knee   INJECTION  no No orders of the defined types were placed in this encounter.   FURTHER WORK UP PLANNED no     Chief Complaint  Patient presents with  . Knee Pain    right knee pain x 1 year     37 year old female presents with knee pain for evaluation  She is 37 years old she has a 1 year history of atraumatic dull aching knee pain associated with swelling around the knee joint.  Pain is diffuse become severe and prevents her from standing.  She has some posterior knee pain as well at night she has a history of lower back pain worked up with physical therapy says not currently having any back pain or radicular symptoms   Review of Systems  Constitutional: Negative for fever.  Skin: Negative.   Neurological: Negative  for tingling.     Past Medical History:  Diagnosis Date  . Allergy    banana/pineapple  . Anemia    post pregnancy  . Anxiety   . Asthma   . Autoimmune disease, not elsewhere classified(279.49) 2013   Non specific- [facility], abnormal Bone Marrow signal  . Breast lump in female 11/28/2012   Has tender round mass at 10-11 0'clock right breast 4 finger breaths from [place] will [place]  . Breast mass, right 08/18/2012  . Chlamydia   . Chronic breast pain 05/31/2015  . Dysmenorrhea 03/23/2014  . Dyspareunia 01/05/2014  . Fibroids, intramural 08/24/2015  . GERD (gastroesophageal reflux disease)   . Gonorrhea   . Headache(784.0)   . History of abnormal cervical Pap smear 04/02/2014  . Hypertension   . LLQ abdominal tenderness 05/31/2015  . Menorrhagia 01/05/2014  . Neuromuscular disorder ([place])    lower back and bilat leg pain  . Obesity   . Other and unspecified ovarian cyst 01/12/2014  . Polyclonal gammopathy 07/26/2013   Insignificant  . Scoliosis   . Vaginal irritation 09/30/2014  . Vaginal Pap smear, abnormal     Past Surgical History:  Procedure Laterality Date  . DILATION AND CURETTAGE OF UTERUS    . TONSILLECTOMY  Family History  Problem Relation Age of Onset  . Hypertension Mother   . Diabetes Mother   . Heart disease Mother   . Diabetes Father   . Cancer Paternal Aunt        breast  . Hypertension Maternal Grandmother   . Diabetes Son        pre diabetic  . Other Son        overactive bladder   Social History   Tobacco Use  . Smoking status: Never Smoker  . Smokeless tobacco: Never Used  Substance Use Topics  . Alcohol use: No  . Drug use: No    Allergies  Allergen Reactions  . Banana Rash  . Pineapple Rash     Current Meds  Medication Sig  . albuterol (PROVENTIL HFA;VENTOLIN HFA) 108 (90 Base) MCG/ACT inhaler Inhale 2 puffs into the lungs every 4 (four) hours as needed.  . cyclobenzaprine (FLEXERIL) 10 MG tablet Take 1 tablet (10 mg total) by  mouth 3 (three) times daily as needed for muscle spasms.  . fluticasone (FLONASE) 50 MCG/ACT nasal spray Place 2 sprays into both nostrils as needed.  . fluticasone furoate-vilanterol (BREO ELLIPTA) 100-25 MCG/INH AEPB Inhale 1 puff into the lungs daily.  . hydrochlorothiazide (HYDRODIURIL) 25 MG tablet Take 1 tablet (25 mg total) by mouth daily.  Marland Kitchen ibuprofen (ADVIL,MOTRIN) 600 MG tablet Take 1 tablet (600 mg total) by mouth every 6 (six) hours as needed.  Marland Kitchen levocetirizine (XYZAL) 5 MG tablet Take 1 tablet (5 mg total) by mouth every evening.  . montelukast (SINGULAIR) 10 MG tablet Take 1 tablet (10 mg total) by mouth at bedtime.  Cyndie Chime Estradiol-Fe [facility] FE,WYMZYA Orrin Brigham) 0.4-35 MG-MCG tablet Chew 1 tablet by mouth daily.  . potassium chloride (K-DUR) 10 MEQ tablet Take 1 tablet (10 mEq total) by mouth daily.  . predniSONE (DELTASONE) 20 MG tablet Take 2 tablets (40 mg total) by mouth daily with breakfast.  . zolpidem (AMBIEN) 10 MG tablet TAKE (1) TABLET BY MOUTH AT BEDTIME. (Patient taking differently: TAKE (1) TABLET BY MOUTH AT BEDTIME PRN)    BP (!) 138/96   Pulse 96   Ht 5\' 5"  (1.651 m)   Wt 266 lb (120.7 kg)   BMI 44.26 kg/m   Physical Exam  Constitutional: She is oriented to person, place, and time. She appears well-developed and well-nourished.  Musculoskeletal:       Right knee: She exhibits no effusion.  Neurological: She is alert and oriented to person, place, and time.  Psychiatric: She has a normal mood and affect. Judgment normal.  Vitals reviewed.   Right Knee Exam   Muscle Strength  The patient has normal right knee strength.  Tenderness  The patient is experiencing tenderness in the medial joint line and lateral joint line.  Range of Motion  Extension: normal  Flexion: normal   Tests  McMurray:  Medial - negative Lateral - negative Varus: negative Valgus: negative Drawer:  Anterior - negative    Posterior - negative  Other   Erythema: absent Scars: absent Sensation: normal Pulse: present Swelling: none Effusion: no effusion present   Left Knee Exam   Muscle Strength  The patient has normal left knee strength.  Tenderness  The patient is experiencing no tenderness.   Range of Motion  Extension: normal  Flexion: normal   Tests  McMurray:  Medial - negative Lateral - negative Varus: negative Valgus: negative Drawer:  Anterior - negative     Posterior -  negative  Other  Erythema: absent Scars: absent Sensation: normal Pulse: present Swelling: none   Back Exam   Tenderness  The patient is experiencing tenderness in the lumbar.        Arther Abbott, MD  10/02/2017 10:44 AM

## 2017-10-07 ENCOUNTER — Other Ambulatory Visit: Payer: Self-pay

## 2017-10-07 ENCOUNTER — Ambulatory Visit (HOSPITAL_COMMUNITY): Payer: Medicaid Other | Attending: Orthopedic Surgery | Admitting: Physical Therapy

## 2017-10-07 DIAGNOSIS — R262 Difficulty in walking, not elsewhere classified: Secondary | ICD-10-CM | POA: Diagnosis present

## 2017-10-07 DIAGNOSIS — M25561 Pain in right knee: Secondary | ICD-10-CM | POA: Insufficient documentation

## 2017-10-07 DIAGNOSIS — G8929 Other chronic pain: Secondary | ICD-10-CM | POA: Insufficient documentation

## 2017-10-07 NOTE — Therapy (Signed)
[place], [place], [address] Phone: [phone]   Fax:  [phone]  Physical Therapy Evaluation  Patient Details  Name: [name] MRN: [id number] Date of Birth: 1980-07-24 Referring Provider: Arther Abbott    Encounter Date: 10/07/2017  PT End of Session - 10/07/17 0941    Visit Number  0    Number of Visits  8    Date for PT Re-Evaluation  11/13/17    Authorization Type  medicaid preauthorized dates put in for 10/14/2017 thru 11/21/2017    PT Start Time  0905    PT Stop Time  0940    PT Time Calculation (min)  35 min    Activity Tolerance  Patient tolerated treatment well    Behavior During Therapy  [facility] for tasks assessed/performed       Past Medical History:  Diagnosis Date  . Allergy    banana/pineapple  . Anemia    post pregnancy  . Anxiety   . Asthma   . Autoimmune disease, not elsewhere classified(279.49) 2013   Non specific- [facility], abnormal Bone Marrow signal  . Breast lump in female 11/28/2012   Has tender round mass at 10-11 0'clock right breast 4 finger breaths from areola will [place]  . Breast mass, right 08/18/2012  . Chlamydia   . Chronic breast pain 05/31/2015  . Dysmenorrhea 03/23/2014  . Dyspareunia 01/05/2014  . Fibroids, intramural 08/24/2015  . GERD (gastroesophageal reflux disease)   . Gonorrhea   . Headache(784.0)   . History of abnormal cervical Pap smear 04/02/2014  . Hypertension   . LLQ abdominal tenderness 05/31/2015  . Menorrhagia 01/05/2014  . Neuromuscular disorder ([place])    lower back and bilat leg pain  . Obesity   . Other and unspecified ovarian cyst 01/12/2014  . Polyclonal gammopathy 07/26/2013   Insignificant  . Scoliosis   . Vaginal irritation 09/30/2014  . Vaginal Pap smear, abnormal     Past Surgical History:  Procedure Laterality Date  . DILATION AND CURETTAGE OF UTERUS    . TONSILLECTOMY      There were no vitals filed for this  visit.   Subjective Assessment - 10/07/17 0906    Subjective  Ms. Whetzel states that her Right knee started to hurt her significantly hurting her about seven months ago when she was standing on her leg more at work.   The pain is both on the anterior and posterior aspect.  She has tried ice, heat and ibuprofen with limited relief of pain.  She is now being referred to physical therapy.      Pertinent History  HTN    Limitations  Standing;Walking;Sitting    How long can you sit comfortably?  two hours     How long can you stand comfortably?  ten minutes     How long can you walk comfortably?  no assistive device.  Able to walk 10-15 mintues.      Patient Stated Goals  less pain     Currently in Pain?  No/denies goes to 8/10 about 4 x a week          [facility] PT Assessment - 10/07/17 0001      Assessment   Medical Diagnosis  Rt knee pain     Referring Provider  Arther Abbott     Onset Date/Surgical Date  03/14/17    Next MD Visit  11/27/2017    Prior Therapy  no  Precautions   Precautions  None      Restrictions   Weight Bearing Restrictions  No      Balance Screen   Has the patient fallen in the past 6 months  No    Has the patient had a decrease in activity level because of a fear of falling?   Yes    Is the patient reluctant to leave their home because of a fear of falling?   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       Cognition   Overall Cognitive Status  Within Functional Limits for tasks assessed      Functional Tests   Functional tests  Single leg stance;Sit to Stand      Single Leg Stance   Comments  RT: 35; Lt 60      Sit to Stand   Comments  5x 10.40 tends to shift wt to the left        ROM / Strength   AROM / PROM / Strength  AROM;Strength      AROM   AROM Assessment Site  -- functional       Strength   Strength Assessment Site  Hip;Knee;Ankle    Right/Left Hip  Right    Right Hip Flexion  4/5    Right Hip Extension  4+/5    Right Hip ABduction  4+/5    Right/Left Knee  Right    Right Knee Flexion  4/5    Right Knee Extension  4+/5    Right/Left Ankle  Right    Right Ankle Dorsiflexion  4/5      Flexibility   Soft Tissue Assessment /Muscle Length  yes    Hamstrings  90/90 position both go to 65    Quadriceps  LT heel 7" to buttock; RT 10"                Objective measurements completed on examination: See above findings.      [facility] Adult PT Treatment/Exercise - 10/07/17 0001      Exercises   Exercises  Knee/Hip      Knee/Hip Exercises: Standing   Heel Raises  10 reps    Functional Squat  5 reps    SLS  x2             PT Education - 10/07/17 0941    Education Details  yes HEP    Person(s) Educated  Patient    Methods  Explanation;Handout    Comprehension  Verbalized understanding;Returned demonstration       PT Short Term Goals - 10/07/17 1130      PT SHORT TERM GOAL #1   Title  Pt right knee pain to be no greater than a 4/10 to allow pt to walk for 20 minutes for improved functional mobility.     Time  2    Period  Weeks    Status  New    Target Date  10/28/17 Pt not starting treatment for a week      PT SHORT TERM GOAL #2   Title  Pt to  be able to go up and down four steps in a reciprocal manner     Time  2    Period  Weeks    Status  New      PT SHORT TERM GOAL #3   Title  Pt to be able to stand for 30 minutes without having increased right knee pain to improve functional standing for work requirements.     Time  2    Period  Weeks    Status  New        PT Long Term Goals - 10/07/17 1132      PT LONG TERM GOAL #1   Title  PT pain in her right knee to be no greater than a 2/10 to allow pt to sleep  throughout the night without waking for the past week     Time  4    Period  Weeks    Status  New    Target Date  11/18/17      PT LONG TERM GOAL #2   Title  Pt to be able to ascend and descend 10 steps in a reciprocal manner     Time  4    Period  Weeks    Status  New      PT LONG TERM GOAL #3   Title  PT to be able to stand for 45 minutes for work requirments.     Time  4    Period  Weeks    Status  New      PT LONG TERM GOAL #4   Title  Pt to be walking 30 minutes a day at least 3 days a week for exercise.    Time  4    Period  Weeks    Status  New             Plan - 10/07/17 0943    Clinical Impression Statement  Ms. Titsworth is a 37 yo female who has had increased pain for the past seven months. She contributes it to being on her feet for prolong time at work.  She has been referred to skilled physical therapy.  Evaluation demonstrates decreased flexibility, decreased strength, decreased balance.   Ms. Capasso will benefit from skilled physical therapy to address these issues to improve her functional ability and improve her comfort.     Clinical Presentation  Stable    Clinical Decision Making  Low    Rehab Potential  Good    PT Frequency  2x / week    PT Duration  4 weeks    PT Treatment/Interventions  ADLs/Self Care Home Management;Therapeutic exercise;Therapeutic activities;Balance training;Stair training;Patient/family education    PT Next Visit Plan  begin rockerboard, terminal extension, lunging and step ups progress to sit to stand, steps and sidestepping with t band.     PT Home Exercise Plan  heel raises; minisquats, SLS, and SLR    Consulted and Agree with Plan of Care  Patient       Patient will benefit from skilled therapeutic intervention in order to improve the following deficits and impairments:  Decreased activity tolerance, Decreased balance, Decreased strength, Difficulty walking, Pain, Increased fascial restricitons  Visit  Diagnosis: Chronic pain of right knee - Plan: PT plan of care cert/re-cert  Difficulty in walking, not elsewhere classified - Plan: PT plan of care cert/re-cert     Problem List Patient Active Problem List   Diagnosis Date Noted  . Dizzy 03/12/2017  . Screening  examination for STD (sexually transmitted disease) 03/12/2017  . Abnormal uterine bleeding (AUB) 03/12/2017  . Cysts of both ovaries 03/12/2017  . Plantar fasciitis 12/16/2015  . Allergic rhinitis 09/14/2015  . Fibroids, intramural 08/24/2015  . LLQ abdominal tenderness 05/31/2015  . Chronic breast pain 05/31/2015  . Glucose intolerance (impaired glucose tolerance) 03/16/2015  . Knee sprain 03/16/2015  . Acute sinusitis 01/04/2015  . Vaginal irritation 09/30/2014  . History of abnormal cervical Pap smear 04/02/2014  . Dysmenorrhea 03/23/2014  . Other and unspecified ovarian cyst 01/12/2014  . Menorrhagia 01/05/2014  . Dyspareunia 01/05/2014  . Polyclonal gammopathy 07/26/2013  . Dysplasia of cervix, unspecified 03/31/2013  . DUB (dysfunctional uterine bleeding) 03/10/2013  . Breast lump in female 11/28/2012  . Heartburn 04/18/2012  . Insomnia 12/09/2011  . Asthma in adult 04/18/2011  . Hypertension 09/21/2010  . FATIGUE 12/13/2009  . HEADACHE 01/18/2009  . Morbid obesity ([place]) 11/03/2007  . Generalized anxiety disorder 11/03/2007  . Chronic low back pain 11/03/2007    Rayetta Humphrey, PT CLT [phone] 10/07/2017, 11:38 AM  [place] [address] [place], [place], [address] Phone: [phone]   Fax:  [phone]  Name: ELIZEBATH WEVER MRN: [id number] Date of Birth: [birth year]

## 2017-10-07 NOTE — Patient Instructions (Addendum)
Heel Raise: Bilateral (Standing)    Rise on balls of feet. Repeat _10___ times per set. Do ___1_ sets per session. Do _2___ sessions per day.  http://orth.exer.us/38   Copyright  VHI. All rights reserved.  Balance: Unilateral    Attempt to balance on right leg, eyes open. Hold 40-60____ seconds. Repeat __3_ times per set. Do _1___ sets per session. Do __1__ sessions per day. Perform exercise with eyes closed.  http://orth.exer.us/28   Copyright  VHI. All rights reserved.  Functional Quadriceps: Chair Squat    Keeping feet flat on floor, shoulder width apart, squat as low as is comfortable. Use support as necessary. Repeat ___10_ times per set. Do _1___ sets per session. Do _2___ sessions per day.  http://orth.exer.us/736   Copyright  VHI. All rights reserved.  Strengthening: Straight Leg Raise (Phase 1)    Tighten muscles on front of right thigh, then lift leg 15____ inches from surface, keeping knee locked.  Repeat ___10_ times per set. Do _1___ sets per session. Do _2__ sessions per day.  http://orth.exer.us/614   Copyright  VHI. All rights reserved.

## 2017-10-14 ENCOUNTER — Ambulatory Visit (HOSPITAL_COMMUNITY): Payer: Medicaid Other

## 2017-10-14 ENCOUNTER — Telehealth (HOSPITAL_COMMUNITY): Payer: Self-pay

## 2017-10-14 NOTE — Telephone Encounter (Signed)
No show #1; called and left message re missed appointment. Reminded pt of next scheduled appointment and asked her to call front office if she couldn't make it.   Geraldine Solar PT, DPT

## 2017-10-17 ENCOUNTER — Encounter (HOSPITAL_COMMUNITY): Payer: Medicaid Other | Admitting: Physical Therapy

## 2017-10-21 ENCOUNTER — Encounter (HOSPITAL_COMMUNITY): Payer: Medicaid Other | Admitting: Physical Therapy

## 2017-10-21 ENCOUNTER — Telehealth (HOSPITAL_COMMUNITY): Payer: Self-pay | Admitting: Physical Therapy

## 2017-10-21 NOTE — Telephone Encounter (Signed)
CR is out util 1pm pt understands and agrees this apptment has been cx. NF 10/21/17

## 2017-10-24 ENCOUNTER — Ambulatory Visit (HOSPITAL_COMMUNITY): Payer: Medicaid Other | Admitting: Physical Therapy

## 2017-10-24 ENCOUNTER — Telehealth (HOSPITAL_COMMUNITY): Payer: Self-pay | Admitting: Physical Therapy

## 2017-10-24 NOTE — Telephone Encounter (Signed)
Pt did not show for appt (NS #2).  Called and left message regarding NS and reminder for next appt 7/1 at [place], PTA/CLT [phone]

## 2017-10-28 ENCOUNTER — Ambulatory Visit (HOSPITAL_COMMUNITY): Payer: Medicaid Other | Admitting: Physical Therapy

## 2017-10-28 ENCOUNTER — Telehealth (HOSPITAL_COMMUNITY): Payer: Self-pay | Admitting: Family Medicine

## 2017-10-28 NOTE — Telephone Encounter (Signed)
10/28/17  left a message that she had an emergency with her dad and had to go to [place]

## 2017-10-28 NOTE — Telephone Encounter (Signed)
10/28/17  Cindy asked me to call and see if the patient did want to continue therapy because she would have to get approval for the visits.  I left patient a message and asked that she call back to let us know.

## 2017-11-01 ENCOUNTER — Encounter (HOSPITAL_COMMUNITY): Payer: Medicaid Other | Admitting: Physical Therapy

## 2017-11-04 ENCOUNTER — Encounter (HOSPITAL_COMMUNITY): Payer: Medicaid Other

## 2017-11-26 ENCOUNTER — Encounter: Payer: Self-pay | Admitting: Family Medicine

## 2017-12-09 ENCOUNTER — Encounter: Payer: Self-pay | Admitting: Orthopedic Surgery

## 2017-12-09 ENCOUNTER — Ambulatory Visit: Payer: Medicaid Other | Admitting: Orthopedic Surgery

## 2017-12-09 VITALS — BP 141/102 | HR 95 | Ht 65.0 in | Wt 263.0 lb

## 2017-12-09 DIAGNOSIS — G8929 Other chronic pain: Secondary | ICD-10-CM | POA: Diagnosis not present

## 2017-12-09 DIAGNOSIS — M25561 Pain in right knee: Secondary | ICD-10-CM | POA: Diagnosis not present

## 2017-12-09 DIAGNOSIS — M23321 Other meniscus derangements, posterior horn of medial meniscus, right knee: Secondary | ICD-10-CM

## 2017-12-09 DIAGNOSIS — M7121 Synovial cyst of popliteal space [Baker], right knee: Secondary | ICD-10-CM | POA: Diagnosis not present

## 2017-12-09 DIAGNOSIS — Z6841 Body Mass Index (BMI) 40.0 and over, adult: Secondary | ICD-10-CM | POA: Diagnosis not present

## 2017-12-09 NOTE — Progress Notes (Addendum)
Chief Complaint  Patient presents with  . Knee Pain    right    37 years old presents for second visit diagnosed with right knee pain.  She has had pain over a year Seen in the office on June 5 with continued ibuprofen and gave her home exercises for therapy.  She says she is now worse and is developed a mass behind the right knee with pain running down into the right foot  System review she denies back pain and denies buttock and leg pain, denies fever chills.  Past Medical History:  Diagnosis Date  . Allergy    banana/pineapple  . Anemia    post pregnancy  . Anxiety   . Asthma   . Autoimmune disease, not elsewhere classified(279.49) 2013   Non specific- [facility], abnormal Bone Marrow signal  . Breast lump in female 11/28/2012   Has tender round mass at 10-11 0'clock right breast 4 finger breaths from [place] will [place]  . Breast mass, right 08/18/2012  . Chlamydia   . Chronic breast pain 05/31/2015  . Dysmenorrhea 03/23/2014  . Dyspareunia 01/05/2014  . Fibroids, intramural 08/24/2015  . GERD (gastroesophageal reflux disease)   . Gonorrhea   . Headache(784.0)   . History of abnormal cervical Pap smear 04/02/2014  . Hypertension   . LLQ abdominal tenderness 05/31/2015  . Menorrhagia 01/05/2014  . Neuromuscular disorder ([place])    lower back and bilat leg pain  . Obesity   . Other and unspecified ovarian cyst 01/12/2014  . Polyclonal gammopathy 07/26/2013   Insignificant  . Scoliosis   . Vaginal irritation 09/30/2014  . Vaginal Pap smear, abnormal      BP (!) 141/102   Pulse 95   Ht 5\' 5"  (1.651 m)   Wt 263 lb (119.3 kg)   BMI 43.77 kg/m   Exam reveals an obese female who is awake alert and oriented x3 mood and affect are normal.  She has no tenderness in the lumbar spine has no pain with flexion or extension.  She has a mass behind the right knee which is tender she has full flexion extension no instability she has tenderness along the medial joint line strength and  muscle tone are normal neurovascular exam is intact  The patient meets the AMA guidelines for Morbid (severe) obesity with a BMI > 40.0 and I have recommended weight loss.  Encounter Diagnoses  Name Primary?  . Chronic pain of right knee-worse Yes  . Body mass index 40.0-44.9, adult ([place])   . Morbid obesity ([place])   . Baker's cyst of knee, right-new   . Derangement of posterior horn of medial meniscus of right knee        Previous note Chief Complaint  Patient presents with  . Knee Pain    right      MEDICAL DECISION SECTION  xrays ordered? no  My independent reading of xrays:  X-rays were done at the hospital with few weeks back.  4 views of the knee show no arthritis changes in the knee no acute fracture  The report indicates the same FINDINGS: No evidence of fracture, dislocation, or joint effusion. No evidence of arthropathy or other focal bone abnormality. Soft tissues are unremarkable.   IMPRESSION: Negative.     Electronically Signed   By: Monte Fantasia M.D.   On: 08/13/2017 11:35   Encounter Diagnoses  Name Primary?  . Chronic pain of right knee Yes  . Body mass index 40.0-44.9, adult ([place])   .  Morbid obesity ([place])   . Baker's cyst of knee, right   . Derangement of posterior horn of medial meniscus of right knee      PLAN:  The patient has 1 year of knee pain no trauma.  She has not had any physical therapy.  She does take over-the-counter ibuprofen.  Recommend Naprosyn and physical therapy return after 6 to 8 weeks and if no improvement we can get an MRI  I am concerned that she has a history of lower back pain still has some back tenderness and some of her pain is at night and extension when she is recumbent and in the posterior aspect of the knee   INJECTION  no No orders of the defined types were placed in this encounter.   FURTHER WORK UP PLANNED no     Chief Complaint  Patient presents with  . Knee Pain    right      37 year old female presents with knee pain for evaluation  She is 37 years old she has a 1 year history of atraumatic dull aching knee pain associated with swelling around the knee joint.  Pain is diffuse become severe and prevents her from standing.  She has some posterior knee pain as well at night she has a history of lower back pain worked up with physical therapy says not currently having any back pain or radicular symptoms  Knee Pain   Pertinent negatives include no tingling.    Review of Systems  Constitutional: Negative for fever.  Skin: Negative.   Neurological: Negative for tingling.     Past Medical History:  Diagnosis Date  . Allergy    banana/pineapple  . Anemia    post pregnancy  . Anxiety   . Asthma   . Autoimmune disease, not elsewhere classified(279.49) 2013   Non specific- [facility], abnormal Bone Marrow signal  . Breast lump in female 11/28/2012   Has tender round mass at 10-11 0'clock right breast 4 finger breaths from areola will [place]  . Breast mass, right 08/18/2012  . Chlamydia   . Chronic breast pain 05/31/2015  . Dysmenorrhea 03/23/2014  . Dyspareunia 01/05/2014  . Fibroids, intramural 08/24/2015  . GERD (gastroesophageal reflux disease)   . Gonorrhea   . Headache(784.0)   . History of abnormal cervical Pap smear 04/02/2014  . Hypertension   . LLQ abdominal tenderness 05/31/2015  . Menorrhagia 01/05/2014  . Neuromuscular disorder ([place])    lower back and bilat leg pain  . Obesity   . Other and unspecified ovarian cyst 01/12/2014  . Polyclonal gammopathy 07/26/2013   Insignificant  . Scoliosis   . Vaginal irritation 09/30/2014  . Vaginal Pap smear, abnormal     Past Surgical History:  Procedure Laterality Date  . DILATION AND CURETTAGE OF UTERUS    . TONSILLECTOMY      Family History  Problem Relation Age of Onset  . Hypertension Mother   . Diabetes Mother   . Heart disease Mother   . Diabetes Father   . Cancer Paternal Aunt         breast  . Hypertension Maternal Grandmother   . Diabetes Son        pre diabetic  . Other Son        overactive bladder   Social History   Tobacco Use  . Smoking status: Never Smoker  . Smokeless tobacco: Never Used  Substance Use Topics  . Alcohol use: No  . Drug use: No  Allergies  Allergen Reactions  . Banana Rash  . Pineapple Rash     Current Meds  Medication Sig  . albuterol (PROVENTIL HFA;VENTOLIN HFA) 108 (90 Base) MCG/ACT inhaler Inhale 2 puffs into the lungs every 4 (four) hours as needed.  . cyclobenzaprine (FLEXERIL) 10 MG tablet Take 1 tablet (10 mg total) by mouth 3 (three) times daily as needed for muscle spasms.  . fluticasone (FLONASE) 50 MCG/ACT nasal spray Place 2 sprays into both nostrils as needed.  . fluticasone furoate-vilanterol (BREO ELLIPTA) 100-25 MCG/INH AEPB Inhale 1 puff into the lungs daily.  . hydrochlorothiazide (HYDRODIURIL) 25 MG tablet Take 1 tablet (25 mg total) by mouth daily.  Marland Kitchen ibuprofen (ADVIL,MOTRIN) 600 MG tablet Take 1 tablet (600 mg total) by mouth every 6 (six) hours as needed.  Marland Kitchen levocetirizine (XYZAL) 5 MG tablet Take 1 tablet (5 mg total) by mouth every evening.  . montelukast (SINGULAIR) 10 MG tablet Take 1 tablet (10 mg total) by mouth at bedtime.  Cyndie Chime Estradiol-Fe [facility] FE,WYMZYA Orrin Brigham) 0.4-35 MG-MCG tablet Chew 1 tablet by mouth daily.  . potassium chloride (K-DUR) 10 MEQ tablet Take 1 tablet (10 mEq total) by mouth daily.    BP (!) 141/102   Pulse 95   Ht 5\' 5"  (1.651 m)   Wt 263 lb (119.3 kg)   BMI 43.77 kg/m   Physical Exam  Constitutional: She is oriented to person, place, and time. She appears well-developed and well-nourished.  Musculoskeletal:       Right knee: She exhibits no effusion.  Neurological: She is alert and oriented to person, place, and time.  Psychiatric: She has a normal mood and affect. Judgment normal.  Vitals reviewed.   Right Knee Exam   Muscle Strength   The patient has normal right knee strength.  Tenderness  The patient is experiencing tenderness in the medial joint line and lateral joint line.  Range of Motion  Extension: normal  Flexion: normal   Tests  [place]:  Medial - negative Lateral - negative Varus: negative Valgus: negative Drawer:  Anterior - negative    Posterior - negative  Other  Erythema: absent Scars: absent Sensation: normal Pulse: present Swelling: none Effusion: no effusion present   Left Knee Exam   Muscle Strength  The patient has normal left knee strength.  Tenderness  The patient is experiencing no tenderness.   Range of Motion  Extension: normal  Flexion: normal   Tests  Mc[place]ray:  Medial - negative Lateral - negative Varus: negative Valgus: negative Drawer:  Anterior - negative     Posterior - negative  Other  Erythema: absent Scars: absent Sensation: normal Pulse: present Swelling: none   Back Exam   Tenderness  The patient is experiencing tenderness in the lumbar.        Arther Abbott, MD  12/09/2017 9:46 AM

## 2017-12-11 ENCOUNTER — Encounter: Payer: Self-pay | Admitting: Family Medicine

## 2017-12-11 ENCOUNTER — Ambulatory Visit: Payer: Medicaid Other | Admitting: Family Medicine

## 2017-12-11 VITALS — BP 150/100 | HR 88 | Temp 98.0°F | Resp 20 | Ht 65.0 in | Wt 266.0 lb

## 2017-12-11 DIAGNOSIS — J4540 Moderate persistent asthma, uncomplicated: Secondary | ICD-10-CM | POA: Diagnosis not present

## 2017-12-11 DIAGNOSIS — J329 Chronic sinusitis, unspecified: Secondary | ICD-10-CM | POA: Diagnosis not present

## 2017-12-11 DIAGNOSIS — D721 Eosinophilia: Secondary | ICD-10-CM

## 2017-12-11 DIAGNOSIS — I10 Essential (primary) hypertension: Secondary | ICD-10-CM

## 2017-12-11 DIAGNOSIS — J301 Allergic rhinitis due to pollen: Secondary | ICD-10-CM | POA: Diagnosis not present

## 2017-12-11 DIAGNOSIS — I160 Hypertensive urgency: Secondary | ICD-10-CM | POA: Diagnosis not present

## 2017-12-11 DIAGNOSIS — J4520 Mild intermittent asthma, uncomplicated: Secondary | ICD-10-CM | POA: Diagnosis not present

## 2017-12-11 DIAGNOSIS — R898 Other abnormal findings in specimens from other organs, systems and tissues: Secondary | ICD-10-CM

## 2017-12-11 LAB — URINALYSIS, ROUTINE W REFLEX MICROSCOPIC
BILIRUBIN URINE: NEGATIVE
Bacteria, UA: NONE SEEN /HPF
Glucose, UA: NEGATIVE
Leukocytes, UA: NEGATIVE
Nitrite: NEGATIVE
PH: 8.5 — AB (ref 5.0–8.0)
SPECIFIC GRAVITY, URINE: 1.02 (ref 1.001–1.03)
WBC, UA: NONE SEEN /HPF (ref 0–5)

## 2017-12-11 LAB — MICROSCOPIC MESSAGE

## 2017-12-11 MED ORDER — IPRATROPIUM BROMIDE 0.03 % NA SOLN: 2.0000 | Freq: Two times a day (BID) | NASAL | 12 refills | Status: DC

## 2017-12-11 MED ORDER — DOXYCYCLINE HYCLATE 100 MG PO TABS: 100.0000 mg | ORAL_TABLET | Freq: Two times a day (BID) | ORAL | 0 refills | Status: AC

## 2017-12-11 MED ORDER — AMLODIPINE BESYLATE 5 MG PO TABS: 5.0000 mg | ORAL_TABLET | Freq: Every day | ORAL | 0 refills | Status: DC

## 2017-12-11 MED ORDER — PREDNISONE 20 MG PO TABS
40.0000 mg | ORAL_TABLET | Freq: Every day | ORAL | 0 refills | Status: AC
Start: 2017-12-11 — End: 2017-12-16

## 2017-12-11 NOTE — Progress Notes (Signed)
Patient ID: [name], female    DOB: 1980/10/14, 37 y.o.   MRN: [id number]  PCP: Alycia Rossetti, MD  Chief Complaint  Patient presents with  . BP elevated    No BP med today    Subjective:   [name] is a 37 y.o. female, presents to clinic with CC of headache, congestion and increased BP for the past 2-3 days.  She has been checking it at work with highest in 150-160's.  She did not take her medication today and reports no HA today.  She denies CP, SOB, visual disturbances, syncope, diaphoresis, N, V, abd pain, back pain, neck pain, numbness, tingling weakness, palpitations, orthopnea.  She has asthma and is a current smoker, she denies any worsening or uncontrolled asthma or allergies.    Her HA has been across her temple, described as pressure and some pounding, worse with laying down, moderate severity, intermittent and gradually worsening for the past couple days, but no pain currently        Patient Active Problem List   Diagnosis Date Noted  . Dizzy 03/12/2017  . Screening examination for STD (sexually transmitted disease) 03/12/2017  . Abnormal uterine bleeding (AUB) 03/12/2017  . Cysts of both ovaries 03/12/2017  . Plantar fasciitis 12/16/2015  . Allergic rhinitis 09/14/2015  . Fibroids, intramural 08/24/2015  . LLQ abdominal tenderness 05/31/2015  . Chronic breast pain 05/31/2015  . Glucose intolerance (impaired glucose tolerance) 03/16/2015  . Knee sprain 03/16/2015  . Acute sinusitis 01/04/2015  . Vaginal irritation 09/30/2014  . History of abnormal cervical Pap smear 04/02/2014  . Dysmenorrhea 03/23/2014  . Other and unspecified ovarian cyst 01/12/2014  . Menorrhagia 01/05/2014  . Dyspareunia 01/05/2014  . Polyclonal gammopathy 07/26/2013  . Dysplasia of cervix, unspecified 03/31/2013  . DUB (dysfunctional uterine bleeding) 03/10/2013  . Breast lump in female 11/28/2012  . Heartburn 04/18/2012  . Insomnia 12/09/2011  . Asthma  in adult 04/18/2011  . Hypertension 09/21/2010  . FATIGUE 12/13/2009  . HEADACHE 01/18/2009  . Morbid obesity ([place]) 11/03/2007  . Generalized anxiety disorder 11/03/2007  . Chronic low back pain 11/03/2007     Prior to Admission medications   Medication Sig Start Date End Date Taking? Authorizing Provider  albuterol (PROVENTIL HFA;VENTOLIN HFA) 108 (90 Base) MCG/ACT inhaler Inhale 2 puffs into the lungs every 4 (four) hours as needed. 08/05/17  Yes [place], Modena Nunnery, MD  cyclobenzaprine (FLEXERIL) 10 MG tablet Take 1 tablet (10 mg total) by mouth 3 (three) times daily as needed for muscle spasms. 02/20/16  Yes Dena Billet B, PA-C  fluticasone (FLONASE) 50 MCG/ACT nasal spray Place 2 sprays into both nostrils as needed. 08/05/17  Yes [place], Modena Nunnery, MD  fluticasone furoate-vilanterol (BREO ELLIPTA) 100-25 MCG/INH AEPB Inhale 1 puff into the lungs daily. 08/05/17  Yes [place], Modena Nunnery, MD  hydrochlorothiazide (HYDRODIURIL) 25 MG tablet Take 1 tablet (25 mg total) by mouth daily. 08/05/17  Yes [place], Modena Nunnery, MD  ibuprofen (ADVIL,MOTRIN) 600 MG tablet Take 1 tablet (600 mg total) by mouth every 6 (six) hours as needed. 08/05/17  Yes [place], Modena Nunnery, MD  levocetirizine (XYZAL) 5 MG tablet Take 1 tablet (5 mg total) by mouth every evening. 08/05/17  Yes [place], Modena Nunnery, MD  montelukast (SINGULAIR) 10 MG tablet Take 1 tablet (10 mg total) by mouth at bedtime. 08/27/17  Yes [place], Modena Nunnery, MD  naproxen (NAPROSYN) 500 MG tablet Take 1 tablet (500 mg total) by mouth 2 (  two) times daily with a meal. 10/02/17  Yes Carole Civil, MD  Norethin-Eth Estradiol-Fe [facility] Or[facility]igham) 0.4-35 MG-MCG tablet Chew 1 tablet by mouth daily. 03/19/17  Yes Derrek Monaco A, NP  potassium chloride (K-DUR) 10 MEQ tablet Take 1 tablet (10 mEq total) by mouth daily. 08/05/17  Yes [place], Modena Nunnery, MD  zolpidem (AMBIEN) 10 MG tablet TAKE (1) TABLET BY MOUTH AT BEDTIME. 02/28/16  Yes ,  Modena Nunnery, MD     Allergies  Allergen Reactions  . Banana Rash  . Pineapple Rash     Family History  Problem Relation Age of Onset  . Hypertension Mother   . Diabetes Mother   . Heart disease Mother   . Diabetes Father   . Cancer Paternal Aunt        breast  . Hypertension Maternal Grandmother   . Diabetes Son        pre diabetic  . Other Son        overactive bladder     Social History   Socioeconomic History  . Marital status: Single    Spouse name: Not on file  . Number of children: Not on file  . Years of education: Not on file  . Highest education level: Not on file  Occupational History  . Not on file  Social Needs  . Financial resource strain: Not on file  . Food insecurity:    Worry: Not on file    Inability: Not on file  . Transportation needs:    Medical: Not on file    Non-medical: Not on file  Tobacco Use  . Smoking status: Never Smoker  . Smokeless tobacco: Never Used  Substance and Sexual Activity  . Alcohol use: No  . Drug use: No  . Sexual activity: Yes    Birth control/protection: Pill  Lifestyle  . Physical activity:    Days per week: Not on file    Minutes per session: Not on file  . Stress: Not on file  Relationships  . Social connections:    Talks on phone: Not on file    Gets together: Not on file    Attends religious service: Not on file    Active member of club or organization: Not on file    Attends meetings of clubs or organizations: Not on file    Relationship status: Not on file  . Intimate partner violence:    Fear of current or ex partner: Not on file    Emotionally abused: Not on file    Physically abused: Not on file    Forced sexual activity: Not on file  Other Topics Concern  . Not on file  Social History Narrative  . Not on file     Review of Systems  Constitutional: Negative.   HENT: Negative.   Eyes: Negative.   Respiratory: Negative.   Cardiovascular: Negative.   Gastrointestinal: Negative.     Endocrine: Negative.   Genitourinary: Negative.   Musculoskeletal: Negative.   Skin: Negative.   Allergic/Immunologic: Negative.   Neurological: Negative.   Hematological: Negative.   Psychiatric/Behavioral: Negative.   All other systems reviewed and are negative.      Objective:    Vitals:   12/11/17 1439  BP: (!) 150/100  Pulse: 88  Resp: 20  Temp: 98 F (36.7 C)  TempSrc: Oral  SpO2: 99%  Weight: 266 lb (120.7 kg)  Height: 5\' 5"  (1.651 m)    Right arm 148/110 Tachy  Physical Exam  Constitutional: She is oriented to person, place, and time. She appears well-developed and well-nourished.  Non-toxic appearance. No distress.  HENT:  Head: Normocephalic and atraumatic.  Right Ear: External ear normal.  Left Ear: External ear normal.  Mouth/Throat: Uvula is midline, oropharynx is clear and moist and mucous membranes are normal. No oropharyngeal exudate.  Nasal mucosa very edematous and erythematous, congested, ttp over frontal sinus and max sinus and bridge of nose  Eyes: Pupils are equal, round, and reactive to light. Conjunctivae, EOM and lids are normal. Right eye exhibits no discharge. Left eye exhibits no discharge. No scleral icterus.  Neck: Normal range of motion and phonation normal. Neck supple. No JVD present. No tracheal deviation present. No thyromegaly present.  Cardiovascular: Normal rate, regular rhythm, normal heart sounds, intact distal pulses and normal pulses. Exam reveals no gallop and no friction rub.  No murmur heard. Pulses:      Radial pulses are 2+ on the right side, and 2+ on the left side.       Posterior tibial pulses are 2+ on the right side, and 2+ on the left side.  Pulmonary/Chest: Effort normal and breath sounds normal. No stridor. No respiratory distress. She has no wheezes. She has no rhonchi. She has no rales. She exhibits no tenderness.  Abdominal: Soft. Normal appearance and bowel sounds are normal. She exhibits no distension. There  is no tenderness. There is no rebound and no guarding.  Musculoskeletal: Normal range of motion. She exhibits no edema or deformity.  Lymphadenopathy:    She has no cervical adenopathy.  Neurological: She is alert and oriented to person, place, and time. No cranial nerve deficit or sensory deficit. She exhibits normal muscle tone. Coordination and gait normal.  MENTAL STATUS: AAOx3, memory intact, fund of knowledge appropriate  LANG/SPEECH: Naming and repetition intact, fluent, follows 3-step commands  CRANIAL NERVES:   II: Pupils equal and reactive, no RAPD, no VF deficits   III, IV, VI: EOM intact, no gaze preference or deviation, no nystagmus.   V: normal sensation in V1, V2, and V3 segments bilaterally   VII: no asymmetry, no nasolabial fold flattening   VIII: normal hearing to speech   IX, X: normal palatal elevation, no uvular deviation   XI: 5/5 head turn and 5/5 shoulder shrug bilaterally   XII: midline tongue protrusion  MOTOR:  5/5 bilateral grip strength 5/5 strength dorsiflexion/plantarflexion b/l  SENSORY:  Normal to light touch Romberg absent  COORD: Normal finger to nose and heel to shin, no tremor, no dysmetria  STATION: normal stance, no truncal ataxia  GAIT: Normal   Skin: Skin is warm, dry and intact. Capillary refill takes less than 2 seconds. No rash noted. She is not diaphoretic. No pallor.  Psychiatric: She has a normal mood and affect. Her speech is normal and behavior is normal.  Nursing note and vitals reviewed.   EKG:  NSR, no ST elevation or depression      Assessment & Plan:      ICD-10-CM   1. Hypertensive urgency I16.0 CBC with Differential/Platelet    COMPLETE METABOLIC PANEL WITH GFR    Lipid panel    Urinalysis, Routine w reflex microscopic    EKG 12-Lead  2. Essential hypertension I10 amLODipine (NORVASC) 5 MG tablet  3. Rhinosinusitis J32.9 predniSONE (DELTASONE) 20 MG tablet    doxycycline (VIBRA-TABS) 100 MG tablet     ipratropium (ATROVENT) 0.03 % nasal spray    Ambulatory referral to Allergy  4. Eosinophil count raised D72.1 Ambulatory referral to Allergy  5. Allergic rhinitis due to pollen, unspecified seasonality J30.1 Ambulatory referral to Allergy  6. Asthma in adult, mild intermittent, uncomplicated E69.50   7. Moderate persistent asthma in adult without complication H22.57 Ambulatory referral to Allergy     Elevated BP, states normally around 120/80, did not take BP meds today, 150/100, varying sx including HA, brief dizziness, and nasal congestion.  No SOB, syncope, CP.    Has hx of anxiety and asthma - feel some anxiety associated with her sx - particularly with elevated BP and brief sx of dizziness, lungs clear and pt denies asthma sx.  She does have severe nasal congestion with maxed out allergy control and obstructed nares, having difficulty sleepig at night due to nasal sx.  No orthopnea, LE edema, palpitations, syncope.  EKG reassuring, obtaining UA and basic labs to eval renal function, currently no concerns for end organ damage.  Will add norvasc at night and pt to continue HCTZ.  Keep BP log.  Take BP at home when relaxed.  No reading from at work with stressed and in pain.    Concern for sinusitis as source of HA with acute on chronic sx, tenderness on exam, maxed out allergy meds - tx with oral steroids and abx.    Recheck in 2 weeks     Delsa Grana, PA-C 12/11/17 3:08 PM

## 2017-12-11 NOTE — Patient Instructions (Signed)
Return for recheck on BP in 2 weeks  Go to the ER if any chest pain and shortness of breath Go to the ER with any severe headaches with vomiting or vision changes or with passing out episodes.  Will treat a sinus infection, and I think your will feel better  Will add another BP medicine AT NIGHT, keep BP log and we will recheck you.  Sinusitis, Adult Sinusitis is soreness and inflammation of your sinuses. Sinuses are hollow spaces in the bones around your face. Your sinuses are located:  Around your eyes.  In the middle of your forehead.  Behind your nose.  In your cheekbones.  Your sinuses and nasal passages are lined with a stringy fluid (mucus). Mucus normally drains out of your sinuses. When your nasal tissues become inflamed or swollen, the mucus can become trapped or blocked so air cannot flow through your sinuses. This allows bacteria, viruses, and funguses to grow, which leads to infection. Sinusitis can develop quickly and last for 7?10 days (acute) or for more than 12 weeks (chronic). Sinusitis often develops after a cold. What are the causes? This condition is caused by anything that creates swelling in the sinuses or stops mucus from draining, including:  Allergies.  Asthma.  Bacterial or viral infection.  Abnormally shaped bones between the nasal passages.  Nasal growths that contain mucus (nasal polyps).  Narrow sinus openings.  Pollutants, such as chemicals or irritants in the air.  A foreign object stuck in the nose.  A fungal infection. This is rare.  What increases the risk? The following factors may make you more likely to develop this condition:  Having allergies or asthma.  Having had a recent cold or respiratory tract infection.  Having structural deformities or blockages in your nose or sinuses.  Having a weak immune system.  Doing a lot of swimming or diving.  Overusing nasal sprays.  Smoking.  What are the signs or symptoms? The  main symptoms of this condition are pain and a feeling of pressure around the affected sinuses. Other symptoms include:  Upper toothache.  Earache.  Headache.  Bad breath.  Decreased sense of smell and taste.  A cough that may get worse at night.  Fatigue.  Fever.  Thick drainage from your nose. The drainage is often green and it may contain pus (purulent).  Stuffy nose or congestion.  Postnasal drip. This is when extra mucus collects in the throat or back of the nose.  Swelling and warmth over the affected sinuses.  Sore throat.  Sensitivity to light.  How is this diagnosed? This condition is diagnosed based on symptoms, a medical history, and a physical exam. To find out if your condition is acute or chronic, your health care provider may:  Look in your nose for signs of nasal polyps.  Tap over the affected sinus to check for signs of infection.  View the inside of your sinuses using an imaging device that has a light attached (endoscope).  If your health care provider suspects that you have chronic sinusitis, you may also:  Be tested for allergies.  Have a sample of mucus taken from your nose (nasal culture) and checked for bacteria.  Have a mucus sample examined to see if your sinusitis is related to an allergy.  If your sinusitis does not respond to treatment and it lasts longer than 8 weeks, you may have an MRI or CT scan to check your sinuses. These scans also help to determine how  severe your infection is. In rare cases, a bone biopsy may be done to rule out more serious types of fungal sinus disease. How is this treated? Treatment for sinusitis depends on the cause and whether your condition is chronic or acute. If a virus is causing your sinusitis, your symptoms will go away on their own within 10 days. You may be given medicines to relieve your symptoms, including:  Topical nasal decongestants. They shrink swollen nasal passages and let mucus drain from  your sinuses.  Antihistamines. These drugs block inflammation that is triggered by allergies. This can help to ease swelling in your nose and sinuses.  Topical nasal corticosteroids. These are nasal sprays that ease inflammation and swelling in your nose and sinuses.  Nasal saline washes. These rinses can help to get rid of thick mucus in your nose.  If your condition is caused by bacteria, you will be given an antibiotic medicine. If your condition is caused by a fungus, you will be given an antifungal medicine. Surgery may be needed to correct underlying conditions, such as narrow nasal passages. Surgery may also be needed to remove polyps. Follow these instructions at home: Medicines  Take, use, or apply over-the-counter and prescription medicines only as told by your health care provider. These may include nasal sprays.  If you were prescribed an antibiotic medicine, take it as told by your health care provider. Do not stop taking the antibiotic even if you start to feel better. Hydrate and Humidify  Drink enough water to keep your urine clear or pale yellow. Staying hydrated will help to thin your mucus.  Use a cool mist humidifier to keep the humidity level in your home above 50%.  Inhale steam for 10-15 minutes, 3-4 times a day or as told by your health care provider. You can do this in the bathroom while a hot shower is running.  Limit your exposure to cool or dry air. Rest  Rest as much as possible.  Sleep with your head raised (elevated).  Make sure to get enough sleep each night. General instructions  Apply a warm, moist washcloth to your face 3-4 times a day or as told by your health care provider. This will help with discomfort.  Wash your hands often with soap and water to reduce your exposure to viruses and other germs. If soap and water are not available, use hand sanitizer.  Do not smoke. Avoid being around people who are smoking (secondhand smoke).  Keep all  follow-up visits as told by your health care provider. This is important. Contact a health care provider if:  You have a fever.  Your symptoms get worse.  Your symptoms do not improve within 10 days. Get help right away if:  You have a severe headache.  You have persistent vomiting.  You have pain or swelling around your face or eyes.  You have vision problems.  You develop confusion.  Your neck is stiff.  You have trouble breathing. This information is not intended to replace advice given to you by your health care provider. Make sure you discuss any questions you have with your health care provider. Document Released: 04/16/2005 Document Revised: 12/11/2015 Document Reviewed: 02/09/2015 Elsevier Interactive Patient Education  2018 [employer].    Managing Your Hypertension Hypertension is commonly called high blood pressure. This is when the force of your blood pressing against the walls of your arteries is too strong. Arteries are blood vessels that carry blood from your heart throughout your  body. Hypertension forces the heart to work harder to pump blood, and may cause the arteries to become narrow or stiff. Having untreated or uncontrolled hypertension can cause heart attack, stroke, kidney disease, and other problems. What are blood pressure readings? A blood pressure reading consists of a higher number over a lower number. Ideally, your blood pressure should be below 120/80. The first ("top") number is called the systolic pressure. It is a measure of the pressure in your arteries as your heart beats. The second ("bottom") number is called the diastolic pressure. It is a measure of the pressure in your arteries as the heart relaxes. What does my blood pressure reading mean? Blood pressure is classified into four stages. Based on your blood pressure reading, your health care provider may use the following stages to determine what type of treatment you need, if any. Systolic  pressure and diastolic pressure are measured in a unit called mm Hg. Normal  Systolic pressure: below 175.  Diastolic pressure: below 80. Elevated  Systolic pressure: 102-585.  Diastolic pressure: below 80. Hypertension stage 1  Systolic pressure: 277-824.  Diastolic pressure: 23-53. Hypertension stage 2  Systolic pressure: 614 or above.  Diastolic pressure: 90 or above. What health risks are associated with hypertension? Managing your hypertension is an important responsibility. Uncontrolled hypertension can lead to:  A heart attack.  A stroke.  A weakened blood vessel (aneurysm).  Heart failure.  Kidney damage.  Eye damage.  Metabolic syndrome.  Memory and concentration problems.  What changes can I make to manage my hypertension? Hypertension can be managed by making lifestyle changes and possibly by taking medicines. Your health care provider will help you make a plan to bring your blood pressure within a normal range. Eating and drinking  Eat a diet that is high in fiber and potassium, and low in salt (sodium), added sugar, and fat. An example eating plan is called the DASH (Dietary Approaches to Stop Hypertension) diet. To eat this way: ? Eat plenty of fresh fruits and vegetables. Try to fill half of your plate at each meal with fruits and vegetables. ? Eat whole grains, such as whole wheat pasta, brown rice, or whole grain bread. Fill about one quarter of your plate with whole grains. ? Eat low-fat diary products. ? Avoid fatty cuts of meat, processed or cured meats, and poultry with skin. Fill about one quarter of your plate with lean proteins such as fish, chicken without skin, beans, eggs, and tofu. ? Avoid premade and processed foods. These tend to be higher in sodium, added sugar, and fat.  Reduce your daily sodium intake. Most people with hypertension should eat less than 1,500 mg of sodium a day.  Limit alcohol intake to no more than 1 drink a day  for nonpregnant women and 2 drinks a day for men. One drink equals 12 oz of beer, 5 oz of wine, or 1 oz of hard liquor. Lifestyle  Work with your health care provider to maintain a healthy body weight, or to lose weight. Ask what an ideal weight is for you.  Get at least 30 minutes of exercise that causes your heart to beat faster (aerobic exercise) most days of the week. Activities may include walking, swimming, or biking.  Include exercise to strengthen your muscles (resistance exercise), such as weight lifting, as part of your weekly exercise routine. Try to do these types of exercises for 30 minutes at least 3 days a week.  Do not use any products  that contain nicotine or tobacco, such as cigarettes and e-cigarettes. If you need help quitting, ask your health care provider.  Control any long-term (chronic) conditions you have, such as high cholesterol or diabetes. Monitoring  Monitor your blood pressure at home as told by your health care provider. Your personal target blood pressure may vary depending on your medical conditions, your age, and other factors.  Have your blood pressure checked regularly, as often as told by your health care provider. Working with your health care provider  Review all the medicines you take with your health care provider because there may be side effects or interactions.  Talk with your health care provider about your diet, exercise habits, and other lifestyle factors that may be contributing to hypertension.  Visit your health care provider regularly. Your health care provider can help you create and adjust your plan for managing hypertension. Will I need medicine to control my blood pressure? Your health care provider may prescribe medicine if lifestyle changes are not enough to get your blood pressure under control, and if:  Your systolic blood pressure is 130 or higher.  Your diastolic blood pressure is 80 or higher.  Take medicines only as told  by your health care provider. Follow the directions carefully. Blood pressure medicines must be taken as prescribed. The medicine does not work as well when you skip doses. Skipping doses also puts you at risk for problems. Contact a health care provider if:  You think you are having a reaction to medicines you have taken.  You have repeated (recurrent) headaches.  You feel dizzy.  You have swelling in your ankles.  You have trouble with your vision. Get help right away if:  You develop a severe headache or confusion.  You have unusual weakness or numbness, or you feel faint.  You have severe pain in your chest or abdomen.  You vomit repeatedly.  You have trouble breathing. Summary  Hypertension is when the force of blood pumping through your arteries is too strong. If this condition is not controlled, it may put you at risk for serious complications.  Your personal target blood pressure may vary depending on your medical conditions, your age, and other factors. For most people, a normal blood pressure is less than 120/80.  Hypertension is managed by lifestyle changes, medicines, or both. Lifestyle changes include weight loss, eating a healthy, low-sodium diet, exercising more, and limiting alcohol. This information is not intended to replace advice given to you by your health care provider. Make sure you discuss any questions you have with your health care provider. Document Released: 01/09/2012 Document Revised: 03/14/2016 Document Reviewed: 03/14/2016 Elsevier Interactive Patient Education  [employer].

## 2017-12-12 ENCOUNTER — Telehealth: Payer: Self-pay | Admitting: Orthopedic Surgery

## 2017-12-12 LAB — COMPLETE METABOLIC PANEL WITH GFR
AG Ratio: 1.2 (calc) (ref 1.0–2.5)
ALBUMIN MSPROF: 3.5 g/dL — AB (ref 3.6–5.1)
ALT: 5 U/L — ABNORMAL LOW (ref 6–29)
AST: 8 U/L — AB (ref 10–30)
Alkaline phosphatase (APISO): 79 U/L (ref 33–115)
BUN / CREAT RATIO: 8 (calc) (ref 6–22)
BUN: 6 mg/dL — ABNORMAL LOW (ref 7–25)
CALCIUM: 8.7 mg/dL (ref 8.6–10.2)
CO2: 29 mmol/L (ref 20–32)
Chloride: 101 mmol/L (ref 98–110)
Creat: 0.75 mg/dL (ref 0.50–1.10)
GFR, EST NON AFRICAN AMERICAN: 103 mL/min/{1.73_m2} (ref >=60)
GFR, Est African American: 120 mL/min/{1.73_m2} (ref >=60)
GLOBULIN: 2.9 g/dL (ref 1.9–3.7)
Glucose, Bld: 84 mg/dL (ref 65–99)
POTASSIUM: 4.1 mmol/L (ref 3.5–5.3)
SODIUM: 138 mmol/L (ref 135–146)
Total Bilirubin: 0.3 mg/dL (ref 0.2–1.2)
Total Protein: 6.4 g/dL (ref 6.1–8.1)

## 2017-12-12 LAB — CBC WITH DIFFERENTIAL/PLATELET
BASOS ABS: 107 {cells}/uL (ref 0–200)
Basophils Relative: 0.9 %
Eosinophils Absolute: 1000 cells/uL — ABNORMAL HIGH (ref 15–500)
Eosinophils Relative: 8.4 %
HEMATOCRIT: 40.9 % (ref 35.0–45.0)
HEMOGLOBIN: 13.7 g/dL (ref 11.7–15.5)
LYMPHS ABS: 2904 {cells}/uL (ref 850–3900)
MCH: 29.8 pg (ref 27.0–33.0)
MCHC: 33.5 g/dL (ref 32.0–36.0)
MCV: 89.1 fL (ref 80.0–100.0)
MPV: 11 fL (ref 7.5–12.5)
Monocytes Relative: 5.1 %
NEUTROS PCT: 61.2 %
Neutro Abs: 7283 cells/uL (ref 1500–7800)
Platelets: 389 10*3/uL (ref 140–400)
RBC: 4.59 10*6/uL (ref 3.80–5.10)
RDW: 11.9 % (ref 11.0–15.0)
Total Lymphocyte: 24.4 %
WBC: 11.9 10*3/uL — ABNORMAL HIGH (ref 3.8–10.8)
WBCMIX: 607 {cells}/uL (ref 200–950)

## 2017-12-12 LAB — LIPID PANEL
Cholesterol: 132 mg/dL (ref <200)
HDL: 61 mg/dL (ref >50)
LDL Cholesterol (Calc): 52 mg/dL (calc)
NON-HDL CHOLESTEROL (CALC): 71 mg/dL (ref <130)
Total CHOL/HDL Ratio: 2.2 (calc) (ref <5.0)
Triglycerides: 104 mg/dL (ref <150)

## 2017-12-12 NOTE — Telephone Encounter (Signed)
Patient inquiring about a note for work to be able to sit down periodically for 1/2 hour to an hour during her 8 hour shift. States her primary care doctor had issued in past.  Please advise.  (patient's next visit is pending approval from insurer for MRI.)

## 2017-12-13 ENCOUNTER — Encounter: Payer: Self-pay | Admitting: Orthopedic Surgery

## 2017-12-13 NOTE — Telephone Encounter (Signed)
Called patient to notify, note ready per Dr Aline Brochure.

## 2017-12-13 NOTE — Telephone Encounter (Signed)
OK 

## 2017-12-16 ENCOUNTER — Telehealth: Payer: Self-pay | Admitting: Radiology

## 2017-12-16 DIAGNOSIS — G8929 Other chronic pain: Secondary | ICD-10-CM

## 2017-12-16 DIAGNOSIS — M25561 Pain in right knee: Principal | ICD-10-CM

## 2017-12-16 NOTE — Telephone Encounter (Signed)
Referral placed for physical therapy left message for patient to call me back so I can advise

## 2017-12-16 NOTE — Telephone Encounter (Signed)
-----   Message from Carole Civil, MD sent at 12/13/2017 12:22 PM EDT ----- Regarding: RE: MRI denial Okay let her know and set up for therapy ----- Message ----- From: Candice Camp, RT Sent: 12/13/2017   8:53 AM EDT To: Carole Civil, MD Subject: RE: MRI denial                                 Not without more conservative care she needs PT, and about 4 more weeks for medicaid to approve. ----- Message ----- From: Carole Civil, MD Sent: 12/13/2017   8:26 AM EDT To: Candice Camp, RT Subject: RE: MRI denial                                 CRITERIA FOR ULTRASOUND -DONT KNOW THAT BUT  WE ARE ALSO LOOKING FOR MEDIAL MENISCUS TEAR  DO WE MEET CRITERIA FOR THAT?? ----- Message ----- From: Candice Camp, RT Sent: 12/12/2017   9:27 AM EDT To: Carole Civil, MD Subject: RE: MRI denial                                 Does it move, how large is it ? Does it feel solid, and does she meet criteria for ultrasound instead ?That is what they asked, we do not know this. ----- Message ----- From: Carole Civil, MD Sent: 12/12/2017   8:46 AM EDT To: Santo Held, Amy Viona Gilmore, RT Subject: RE: MRI denial                                 The note says   Mass 3 times! ----- Message ----- From: Santo Held Sent: 12/12/2017   8:12 AM EDT To: Carole Civil, MD Subject: MRI denial                                     They denied the patients MRI because clinical information did not  meet criteria for mass. Amy said you might want to make an addendum to the patient's note. Please advise.

## 2017-12-17 NOTE — Telephone Encounter (Signed)
Patient called back asked for my chart message, I have sent one to her about the MRI scan

## 2017-12-25 ENCOUNTER — Ambulatory Visit: Payer: Medicaid Other | Admitting: Family Medicine

## 2018-01-25 ENCOUNTER — Encounter (HOSPITAL_COMMUNITY): Payer: Self-pay | Admitting: Emergency Medicine

## 2018-01-25 ENCOUNTER — Emergency Department (HOSPITAL_COMMUNITY)
Admission: EM | Admit: 2018-01-25 | Discharge: 2018-01-25 | Disposition: A | Discharge status: Home / Self Care | Payer: Medicaid Other | Attending: Emergency Medicine | Admitting: Emergency Medicine

## 2018-01-25 ENCOUNTER — Other Ambulatory Visit: Payer: Self-pay

## 2018-01-25 DIAGNOSIS — M25561 Pain in right knee: Secondary | ICD-10-CM | POA: Diagnosis not present

## 2018-01-25 DIAGNOSIS — Z79899 Other long term (current) drug therapy: Secondary | ICD-10-CM | POA: Insufficient documentation

## 2018-01-25 DIAGNOSIS — J45909 Unspecified asthma, uncomplicated: Secondary | ICD-10-CM | POA: Diagnosis not present

## 2018-01-25 DIAGNOSIS — M2391 Unspecified internal derangement of right knee: Secondary | ICD-10-CM | POA: Diagnosis not present

## 2018-01-25 MED ORDER — ONDANSETRON HCL 4 MG PO TABS
4.0000 mg | ORAL_TABLET | Freq: Once | ORAL | Status: AC
  Administered 2018-01-25: 4 mg via ORAL
  Filled 2018-01-25: qty 1

## 2018-01-25 MED ORDER — DEXAMETHASONE 4 MG PO TABS: 4.0000 mg | ORAL_TABLET | Freq: Two times a day (BID) | ORAL | 0 refills | Status: DC

## 2018-01-25 MED ORDER — TRAMADOL HCL 50 MG PO TABS
100.0000 mg | ORAL_TABLET | Freq: Once | ORAL | Status: AC
  Administered 2018-01-25: 100 mg via ORAL
  Filled 2018-01-25: qty 2

## 2018-01-25 MED ORDER — PREDNISONE 20 MG PO TABS
40.0000 mg | ORAL_TABLET | Freq: Once | ORAL | Status: AC
  Administered 2018-01-25: 40 mg via ORAL
  Filled 2018-01-25: qty 2

## 2018-01-25 MED ORDER — DICLOFENAC SODIUM 75 MG PO TBEC: 75.0000 mg | DELAYED_RELEASE_TABLET | Freq: Two times a day (BID) | ORAL | 0 refills | Status: DC

## 2018-01-25 MED ORDER — KETOROLAC TROMETHAMINE 10 MG PO TABS
10.0000 mg | ORAL_TABLET | Freq: Once | ORAL | Status: AC
  Administered 2018-01-25: 10 mg via ORAL
  Filled 2018-01-25: qty 1

## 2018-01-25 MED ORDER — TRAMADOL HCL 50 MG PO TABS: ORAL_TABLET | ORAL | 0 refills | Status: DC

## 2018-01-25 NOTE — Discharge Instructions (Signed)
Review of your records indicates that you have a Baker's cyst and some internal derangement involving the right knee.  Your examination suggests that it is aggravation of one or both of these entities that is causing pain and swelling of your knee.  Please use your crutches until seen by Dr. Aline Brochure.  Please use Decadron and diclofenac 2 times daily with a meal.  Use Tylenol extra strength for mild to moderate pain.  Use Ultram for more severe pain. This medication may cause drowsiness. Please do not drink, drive, or participate in activity that requires concentration while taking this medication.  Please see Dr. Aline Brochure as soon as possible concerning your knee.

## 2018-01-25 NOTE — ED Triage Notes (Signed)
Patient c/o right leg swelling that started this morning upon waking. Per patient has had swelling to right knee with a "pocket of fluid behind knee." Patient being seen by Dr Jarvis Morgan and was supposed to have MRI of knee but was unable to due to insurance. Patient states she has not had ultrasound of leg or knee, last had x-ray two months ago. Patient does report pain with ambulation. No obvious swelling in lower leg, pedal pulse present, foot warm.

## 2018-01-25 NOTE — ED Provider Notes (Signed)
[facility] EMERGENCY DEPARTMENT Provider Note   CSN: [id number] Arrival date & time: 01/25/18  1530     History   Chief Complaint Chief Complaint  Patient presents with  . Leg Swelling    HPI [name] is a 37 y.o. female.  Patient is a 37 year old female who presents to the emergency department with a complaint of right knee pain and right knee swelling.  The patient has been having a problem with this issue for some time now.  She has been seen by orthopedics/Dr.Harrison.  She was noted to have swelling of the right knee, with a pocket of fluid behind the knee.  She was scheduled to have an MRI of the knee, but her insurance has not approved it up to this point.  Patient complains of increasing pain, particularly with ambulation, also if she has to do any bending that aggravates the pain as well.  She noted what she thought to be more swelling this morning, but the swelling seems to be going down some this afternoon but is still present.  She has not noticed a hot joint of any of the joints involving the right lower extremity.  No recent injury, and no recent operations or procedures.  She presents now for assistance with her pain.  The history is provided by the patient.    Past Medical History:  Diagnosis Date  . Allergy    banana/pineapple  . Anemia    post pregnancy  . Anxiety   . Asthma   . Autoimmune disease, not elsewhere classified(279.49) 2013   Non specific- [facility], abnormal Bone Marrow signal  . Breast lump in female 11/28/2012   Has tender round mass at 10-11 0'clock right breast 4 finger breaths from [place] will [place]  . Breast mass, right 08/18/2012  . Chlamydia   . Chronic breast pain 05/31/2015  . Dysmenorrhea 03/23/2014  . Dyspareunia 01/05/2014  . Fibroids, intramural 08/24/2015  . GERD (gastroesophageal reflux disease)   . Gonorrhea   . Headache(784.0)   . History of abnormal cervical Pap smear 04/02/2014  . Hypertension   . LLQ  abdominal tenderness 05/31/2015  . Menorrhagia 01/05/2014  . Neuromuscular disorder ([place])    lower back and bilat leg pain  . Obesity   . Other and unspecified ovarian cyst 01/12/2014  . Polyclonal gammopathy 07/26/2013   Insignificant  . Scoliosis   . Vaginal irritation 09/30/2014  . Vaginal Pap smear, abnormal     Patient Active Problem List   Diagnosis Date Noted  . Dizzy 03/12/2017  . Screening examination for STD (sexually transmitted disease) 03/12/2017  . Abnormal uterine bleeding (AUB) 03/12/2017  . Cysts of both ovaries 03/12/2017  . Plantar fasciitis 12/16/2015  . Allergic rhinitis 09/14/2015  . Fibroids, intramural 08/24/2015  . LLQ abdominal tenderness 05/31/2015  . Chronic breast pain 05/31/2015  . Glucose intolerance (impaired glucose tolerance) 03/16/2015  . Knee sprain 03/16/2015  . Acute sinusitis 01/04/2015  . Vaginal irritation 09/30/2014  . History of abnormal cervical Pap smear 04/02/2014  . Dysmenorrhea 03/23/2014  . Other and unspecified ovarian cyst 01/12/2014  . Menorrhagia 01/05/2014  . Dyspareunia 01/05/2014  . Polyclonal gammopathy 07/26/2013  . Dysplasia of cervix, unspecified 03/31/2013  . DUB (dysfunctional uterine bleeding) 03/10/2013  . Breast lump in female 11/28/2012  . Heartburn 04/18/2012  . Insomnia 12/09/2011  . Asthma in adult 04/18/2011  . Hypertension 09/21/2010  . FATIGUE 12/13/2009  . HEADACHE 01/18/2009  . Morbid obesity ([place]) 11/03/2007  .  Generalized anxiety disorder 11/03/2007  . Chronic low back pain 11/03/2007    Past Surgical History:  Procedure Laterality Date  . DILATION AND CURETTAGE OF UTERUS    . TONSILLECTOMY       OB History    Gravida  1   Para  1   Term  1   Preterm      AB  0   Living  1     SAB  0   TAB      Ectopic      Multiple      Live Births  1            Home Medications    Prior to Admission medications   Medication Sig Start Date End Date Taking? Authorizing Provider    albuterol (PROVENTIL HFA;VENTOLIN HFA) 108 (90 Base) MCG/ACT inhaler Inhale 2 puffs into the lungs every 4 (four) hours as needed. 08/05/17   Cash, Modena Nunnery, MD  amLODipine (NORVASC) 5 MG tablet Take 1 tablet (5 mg total) by mouth daily. 12/11/17   Delsa Grana, PA-C  cyclobenzaprine (FLEXERIL) 10 MG tablet Take 1 tablet (10 mg total) by mouth 3 (three) times daily as needed for muscle spasms. 02/20/16   Dena Billet B, PA-C  fluticasone (FLONASE) 50 MCG/ACT nasal spray Place 2 sprays into both nostrils as needed. 08/05/17   [place], Modena Nunnery, MD  fluticasone furoate-vilanterol (BREO ELLIPTA) 100-25 MCG/INH AEPB Inhale 1 puff into the lungs daily. 08/05/17   [place], Modena Nunnery, MD  hydrochlorothiazide (HYDRODIURIL) 25 MG tablet Take 1 tablet (25 mg total) by mouth daily. 08/05/17   Alycia Rossetti, MD  ibuprofen (ADVIL,MOTRIN) 600 MG tablet Take 1 tablet (600 mg total) by mouth every 6 (six) hours as needed. 08/05/17   Alycia Rossetti, MD  ipratropium (ATROVENT) 0.03 % nasal spray Place 2 sprays into both nostrils every 12 (twelve) hours. 12/11/17   Delsa Grana, PA-C  levocetirizine (XYZAL) 5 MG tablet Take 1 tablet (5 mg total) by mouth every evening. 08/05/17   [place], Modena Nunnery, MD  montelukast (SINGULAIR) 10 MG tablet Take 1 tablet (10 mg total) by mouth at bedtime. 08/27/17   Alycia Rossetti, MD  naproxen (NAPROSYN) 500 MG tablet Take 1 tablet (500 mg total) by mouth 2 (two) times daily with a meal. 10/02/17   Carole Civil, MD  Norethin-Eth Estradiol-Fe [facility] FE,WYMZYA Orrin Brigham) 0.4-35 MG-MCG tablet Chew 1 tablet by mouth daily. 03/19/17   Estill Dooms, NP  potassium chloride (K-DUR) 10 MEQ tablet Take 1 tablet (10 mEq total) by mouth daily. 08/05/17   Alycia Rossetti, MD  zolpidem (AMBIEN) 10 MG tablet TAKE (1) TABLET BY MOUTH AT BEDTIME. 02/28/16   Alycia Rossetti, MD    Family History Family History  Problem Relation Age of Onset  . Hypertension Mother   . Diabetes  Mother   . Heart disease Mother   . Diabetes Father   . Cancer Paternal Aunt        breast  . Hypertension Maternal Grandmother   . Diabetes Son        pre diabetic  . Other Son        overactive bladder    Social History Social History   Tobacco Use  . Smoking status: Never Smoker  . Smokeless tobacco: Never Used  Substance Use Topics  . Alcohol use: No  . Drug use: No     Allergies   Banana and Pineapple  Review of Systems Review of Systems  Constitutional: Negative for activity change and fever.       All ROS Neg except as noted in HPI  HENT: Negative for nosebleeds.   Eyes: Negative for photophobia and discharge.  Respiratory: Negative for cough, shortness of breath and wheezing.   Cardiovascular: Negative for chest pain and palpitations.  Gastrointestinal: Negative for abdominal pain, blood in stool and vomiting.  Genitourinary: Negative for dysuria, frequency and hematuria.  Musculoskeletal: Positive for arthralgias. Negative for back pain and neck pain.  Skin: Negative.   Neurological: Negative for dizziness, seizures and speech difficulty.  Psychiatric/Behavioral: Negative for confusion and hallucinations.     Physical Exam Updated Vital Signs BP (!) 131/100 (BP Location: Right Arm)   Pulse 99   Temp 98.1 F (36.7 C) (Oral)   Resp 18   Ht 5\' 5"  (1.651 m)   Wt 108.9 kg   SpO2 100%   BMI 39.94 kg/m   Physical Exam  Constitutional: She is oriented to person, place, and time. She appears well-developed and well-nourished.  Non-toxic appearance.  HENT:  Head: Normocephalic.  Right Ear: Tympanic membrane and external ear normal.  Left Ear: Tympanic membrane and external ear normal.  Eyes: Pupils are equal, round, and reactive to light. EOM and lids are normal.  Neck: Normal range of motion. Neck supple. Carotid bruit is not present.  Cardiovascular: Normal rate, regular rhythm, normal heart sounds, intact distal pulses and normal pulses.    Pulmonary/Chest: Breath sounds normal. No respiratory distress.  Abdominal: Soft. Bowel sounds are normal. There is no tenderness. There is no guarding.  Musculoskeletal:       Right knee: She exhibits decreased range of motion and bony tenderness. She exhibits no effusion and no erythema. Tenderness found. Medial joint line tenderness noted.  There is fullness of the posterior knee with tenderness. Pt states this is not new. No calf tenderness. DP 2+.   Lymphadenopathy:       Head (right side): No submandibular adenopathy present.       Head (left side): No submandibular adenopathy present.    She has no cervical adenopathy.  Neurological: She is alert and oriented to person, place, and time. She has normal strength. No cranial nerve deficit or sensory deficit.  Skin: Skin is warm and dry.  Psychiatric: She has a normal mood and affect. Her speech is normal.  Nursing note and vitals reviewed.    ED Treatments / Results  Labs (all labs ordered are listed, but only abnormal results are displayed) Labs Reviewed - No data to display  EKG None  Radiology No results found.  Procedures Procedures (including critical care time)  Medications Ordered in ED Medications - No data to display   Initial Impression / Assessment and Plan / ED Course  I have reviewed the triage vital signs and the nursing notes.  Pertinent labs & imaging results that were available during my care of the patient were reviewed by me and considered in my medical decision making (see chart for details).       Final Clinical Impressions(s) / ED Diagnoses MDM Vital signs reviewed.  Pulse oximetry is 100% on room air.  There is no evidence of septic joint noted on examination.  The patient has not had injury or trauma to the affected extremity.  There are no vascular deficits appreciated.   The examination favors pain probably from the internal derangement of the knee.  Patient will be treated with Decadron,  diclofenac, and Ultram.  I have asked the patient to see Dr. Aline Brochure for additional evaluation and assistance with management of this issue.  Patient is in agreement with this plan.   Final diagnoses:  Acute pain of right knee  Internal derangement of knee joint, right    ED Discharge Orders         Ordered    dexamethasone (DECADRON) 4 MG tablet  2 times daily with meals     01/25/18 1742    diclofenac (VOLTAREN) 75 MG EC tablet  2 times daily     01/25/18 1742    traMADol (ULTRAM) 50 MG tablet     01/25/18 1742           Lily Kocher, PA-C 01/27/18 1705    Isla Pence, MD 01/30/18 1503

## 2018-01-31 ENCOUNTER — Other Ambulatory Visit: Payer: Self-pay | Admitting: Adult Health

## 2018-02-03 ENCOUNTER — Ambulatory Visit: Payer: Medicaid Other | Admitting: Orthopedic Surgery

## 2018-02-03 ENCOUNTER — Encounter: Payer: Self-pay | Admitting: Orthopedic Surgery

## 2018-02-03 VITALS — BP 135/99 | HR 108 | Ht 65.0 in | Wt 264.0 lb

## 2018-02-03 DIAGNOSIS — Z6841 Body Mass Index (BMI) 40.0 and over, adult: Secondary | ICD-10-CM | POA: Diagnosis not present

## 2018-02-03 DIAGNOSIS — M25561 Pain in right knee: Secondary | ICD-10-CM

## 2018-02-03 DIAGNOSIS — M23321 Other meniscus derangements, posterior horn of medial meniscus, right knee: Secondary | ICD-10-CM

## 2018-02-03 DIAGNOSIS — G8929 Other chronic pain: Secondary | ICD-10-CM

## 2018-02-03 DIAGNOSIS — M2391 Unspecified internal derangement of right knee: Secondary | ICD-10-CM

## 2018-02-03 NOTE — Patient Instructions (Addendum)
MRI RT KNEE   ICE TO KNEE 3 X A DAY   OOW UNTIL AFTER NEXT APPT  USE CUTCH FOR SUPPORT   TAKE IBUPROFEN 800 MG 3 X A DAY  Magnetic Resonance Imaging Magnetic resonance imaging (MRI) is a painless test that takes pictures of the inside of your body. This test uses a strong magnet. This test does not use X-rays or radiation. What happens before the procedure?  You will be asked to take off all metal. This includes: ? Your watch, jewelry, and other metal items. ? Some makeup may have very small bits of metal and may need to be taken off. ? Braces and fillings normally are not a problem. What happens during the procedure?  You may be given earplugs or headphones to listen to music. The machine can be noisy.  You might get a shot (injection) with a dye (contrast material) to help the MRI take better pictures.  MRI is done in a tunnel-shaped scanner. You will lie on a table that slides into the tunnel-shaped scanner. Once inside, you will still be able to talk to the person doing the test.  You will be asked to hold very still. You will be told when you can shift position. You may have to wait a few minutes to make sure the images are readable. What happens after the procedure?  You may go back to your normal activities right away.  If you got a shot of dye, it will pass naturally through your body within a day.  Your doctor will talk to you about the results. This information is not intended to replace advice given to you by your health care provider. Make sure you discuss any questions you have with your health care provider. Document Released: 05/19/2010 Document Revised: 09/22/2015 Document Reviewed: 06/11/2013 Elsevier Interactive Patient Education  [employer].

## 2018-02-03 NOTE — Progress Notes (Signed)
Office visit after ER visit  Chief Complaint  Patient presents with  . Knee Pain-RIGHT     SInce Jan '19 got worse with swelling 24/59    37 years old seen in June MRI was recommended.  Medicaid denied the MRI so she was sent to physical therapy.  She went to therapy once and then had 2 no-shows and then presented to the ER for right knee pain  This is the ER report conclusion  final Clinical Impressions(s) / ED Diagnoses MDM Vital signs reviewed.  Pulse oximetry is 100% on room air.  There is no evidence of septic joint noted on examination.  The patient has not had injury or trauma to the affected extremity.  There are no vascular deficits appreciated.   The examination favors pain probably from the internal derangement of the knee.  Patient will be treated with Decadron, diclofenac, and Ultram.  I have asked the patient to see Dr. Aline Brochure for additional evaluation and assistance with management of this issue.  Patient is in agreement with this plan.  Previously has been prescribed naproxen, diclofenac and Flexeril   Review of Systems  Constitutional: Negative for chills and fever.  Musculoskeletal:       Acute onset right leg pain which has resolved.  Patient with a history of lumbar disc pain     Past Medical History:  Diagnosis Date  . Allergy    banana/pineapple  . Anemia    post pregnancy  . Anxiety   . Asthma   . Autoimmune disease, not elsewhere classified(279.49) 2013   Non specific- [facility], abnormal Bone Marrow signal  . Breast lump in female 11/28/2012   Has tender round mass at 10-11 0'clock right breast 4 finger breaths from [place] will [place]  . Breast mass, right 08/18/2012  . Chlamydia   . Chronic breast pain 05/31/2015  . Dysmenorrhea 03/23/2014  . Dyspareunia 01/05/2014  . Fibroids, intramural 08/24/2015  . GERD (gastroesophageal reflux disease)   . Gonorrhea   . Headache(784.0)   . History of abnormal cervical Pap smear 04/02/2014  . Hypertension    . LLQ abdominal tenderness 05/31/2015  . Menorrhagia 01/05/2014  . Neuromuscular disorder ([place])    lower back and bilat leg pain  . Obesity   . Other and unspecified ovarian cyst 01/12/2014  . Polyclonal gammopathy 07/26/2013   Insignificant  . Scoliosis   . Vaginal irritation 09/30/2014  . Vaginal Pap smear, abnormal     Past Surgical History:  Procedure Laterality Date  . DILATION AND CURETTAGE OF UTERUS    . TONSILLECTOMY      Family History  Problem Relation Age of Onset  . Hypertension Mother   . Diabetes Mother   . Heart disease Mother   . Diabetes Father   . Cancer Paternal Aunt        breast  . Hypertension Maternal Grandmother   . Diabetes Son        pre diabetic  . Other Son        overactive bladder   Social History   Tobacco Use  . Smoking status: Never Smoker  . Smokeless tobacco: Never Used  Substance Use Topics  . Alcohol use: No  . Drug use: No    Allergies  Allergen Reactions  . Banana Rash  . Pineapple Rash    Current Meds  Medication Sig  . albuterol (PROVENTIL HFA;VENTOLIN HFA) 108 (90 Base) MCG/ACT inhaler Inhale 2 puffs into the lungs every 4 (four)  hours as needed.  Marland Kitchen amLODipine (NORVASC) 5 MG tablet Take 1 tablet (5 mg total) by mouth daily.  . fluticasone (FLONASE) 50 MCG/ACT nasal spray Place 2 sprays into both nostrils as needed.  . fluticasone furoate-vilanterol (BREO ELLIPTA) 100-25 MCG/INH AEPB Inhale 1 puff into the lungs daily.  . hydrochlorothiazide (HYDRODIURIL) 25 MG tablet Take 1 tablet (25 mg total) by mouth daily.  Marland Kitchen ibuprofen (ADVIL,MOTRIN) 600 MG tablet Take 1 tablet (600 mg total) by mouth every 6 (six) hours as needed.  Marland Kitchen ipratropium (ATROVENT) 0.03 % nasal spray Place 2 sprays into both nostrils every 12 (twelve) hours.  Marland Kitchen levocetirizine (XYZAL) 5 MG tablet Take 1 tablet (5 mg total) by mouth every evening.  . montelukast (SINGULAIR) 10 MG tablet Take 1 tablet (10 mg total) by mouth at bedtime.  Cyndie Chime  Estradiol-Fe (FEMCON FE,WYMZYA FE,ZENCHENT FE,ZEOSA) 0.4-35 MG-MCG tablet CHEW ONE TABLET BY MOUTH ONCE DAILY.  Marland Kitchen potassium chloride (K-DUR) 10 MEQ tablet Take 1 tablet (10 mEq total) by mouth daily.    BP (!) 135/99   Pulse (!) 108   Ht 5\' 5"  (1.651 m)   Wt 264 lb (119.7 kg)   BMI 43.93 kg/m   Physical Exam  Constitutional: She is oriented to person, place, and time. She appears well-developed and well-nourished.  Neurological: She is alert and oriented to person, place, and time.  Psychiatric: She has a normal mood and affect. Judgment normal.  Vitals reviewed.   Ortho Exam  Left knee hyperextends but otherwise full range of motion no tenderness to palpation all ligaments are stable muscle tone and strength are normal there is no atrophy or tremor skin is warm dry and intact pulses are good sensation is normal  The right knee hyperextends as well painful flexion throughout the range of motion up to 120 degrees AP stability is normal muscle tone is normal with no tremor skin is intact pulses are good sensation is normal straight leg raise reproduce tightness without radicular pain  Tenderness in the medial joint line effusion is noted with popliteal fullness Internal derangement and meniscal tear suspected  MEDICAL DECISION SECTION  Xrays were done at Office  My independent reading of xrays:  Previous x-rays show no fracture dislocation or malalignment  Encounter Diagnoses  Name Primary?  . Chronic pain of right knee   . Internal derangement of right knee Yes  . Body mass index 40.0-44.9, adult ([place])   . Morbid obesity ([place])   . Derangement of posterior horn of medial meniscus of right knee     PLAN: (Rx., injectx, surgery, frx, mri/ct) The patient meets the AMA guidelines for Morbid (severe) obesity with a BMI > 40.0 and I have recommended weight loss.  MRI RT KNEE   ICE TO KNEE 3 X A DAY   OOW UNTIL AFTER NEXT APPT  USE CUTCH FOR SUPPORT   TAKE IBUPROFEN 800  MG 3 X A DAY     No orders of the defined types were placed in this encounter.   Arther Abbott, MD  02/03/2018 11:19 AM

## 2018-02-04 ENCOUNTER — Ambulatory Visit: Payer: Medicaid Other | Admitting: Family Medicine

## 2018-02-06 ENCOUNTER — Telehealth: Payer: Self-pay | Admitting: Radiology

## 2018-02-06 NOTE — Telephone Encounter (Signed)
MRI Approved Medicaid has been scheduled for Tuesday Oct 15th at 1pm, arrive at [place] patient and made a follow up appt

## 2018-02-11 ENCOUNTER — Ambulatory Visit: Payer: Medicaid Other | Admitting: Family Medicine

## 2018-02-11 ENCOUNTER — Other Ambulatory Visit: Payer: Self-pay

## 2018-02-11 ENCOUNTER — Ambulatory Visit: Payer: Medicaid Other | Admitting: Allergy & Immunology

## 2018-02-11 ENCOUNTER — Encounter: Payer: Self-pay | Admitting: Family Medicine

## 2018-02-11 ENCOUNTER — Ambulatory Visit (HOSPITAL_COMMUNITY)
Admission: RE | Admit: 2018-02-11 | Discharge: 2018-02-11 | Disposition: A | Discharge status: Home / Self Care | Payer: Medicaid Other | Source: Ambulatory Visit | Attending: Orthopedic Surgery | Admitting: Orthopedic Surgery

## 2018-02-11 VITALS — BP 134/92 | HR 82 | Temp 98.6°F | Resp 16 | Ht 65.0 in | Wt 268.0 lb

## 2018-02-11 DIAGNOSIS — I10 Essential (primary) hypertension: Secondary | ICD-10-CM

## 2018-02-11 DIAGNOSIS — G8929 Other chronic pain: Secondary | ICD-10-CM | POA: Diagnosis not present

## 2018-02-11 DIAGNOSIS — M25561 Pain in right knee: Secondary | ICD-10-CM | POA: Diagnosis not present

## 2018-02-11 DIAGNOSIS — Z23 Encounter for immunization: Secondary | ICD-10-CM

## 2018-02-11 LAB — BASIC METABOLIC PANEL WITH GFR
BUN: 8 mg/dL (ref 7–25)
CHLORIDE: 104 mmol/L (ref 98–110)
CO2: 25 mmol/L (ref 20–32)
Calcium: 8.4 mg/dL — ABNORMAL LOW (ref 8.6–10.2)
Creat: 0.73 mg/dL (ref 0.50–1.10)
GFR, Est African American: 124 mL/min/{1.73_m2} (ref >=60)
GFR, Est Non African American: 107 mL/min/{1.73_m2} (ref >=60)
GLUCOSE: 86 mg/dL (ref 65–99)
Potassium: 3.8 mmol/L (ref 3.5–5.3)
SODIUM: 138 mmol/L (ref 135–146)

## 2018-02-11 MED ORDER — AMLODIPINE BESYLATE 5 MG PO TABS: 5.0000 mg | ORAL_TABLET | Freq: Every day | ORAL | 0 refills | Status: DC

## 2018-02-11 NOTE — Progress Notes (Signed)
Patient ID: [name], female    DOB: Sep 20, 1980, 37 y.o.   MRN: [id number]  PCP: Alycia Rossetti, MD  Chief Complaint  Patient presents with  . BP Check    states BP is still running high  . BS Check    x1 month- states that she has sx of hypoglycemia- eats cracker or drinks juice and feels better    Subjective:   [name] is a 37 y.o. female, presents to clinic with CC of hypertension recheck.  She was last checked in our clinic about 2 months ago on blood pressure was 150/100.  She was given additional medication at that time, Norvasc in addition to HCTZ which has been managed by her PCP.  She states that it did not get much better, she is also run out of Norvasc and did not come in as planned for recheck and did not call for refills.  She states that she is concerned about diastolic blood pressure she reports that pressures run 134/99 - 150/100.  She denies any headaches, visual disturbances, chest pain, shortness of breath, lower extremity edema.  She reports no side effects of medication when she was taking Norvasc.    She is having severe right knee pain which she is going for MRI today.  She is also still dealing with severe allergies and is seeing the allergist tomorrow.  She is also concerned with possible episodes of low sugar.  States that for the last month she has been having sudden times when she feels very jittery, shaky and if she eats something drinks juice and lies down she feels better.  Her mother is a diabetic and has told her that that it similar to when her sugars are low so she suspects that she is having hypoglycemia.  She has not checked her blood sugar while having these episodes.  Last episode was 1 week ago.  She is fasting this morning, will check labs.   Patient Active Problem List   Diagnosis Date Noted  . Dizzy 03/12/2017  . Screening examination for STD (sexually transmitted disease) 03/12/2017  . Abnormal uterine bleeding  (AUB) 03/12/2017  . Cysts of both ovaries 03/12/2017  . Plantar fasciitis 12/16/2015  . Allergic rhinitis 09/14/2015  . Fibroids, intramural 08/24/2015  . LLQ abdominal tenderness 05/31/2015  . Chronic breast pain 05/31/2015  . Glucose intolerance (impaired glucose tolerance) 03/16/2015  . Knee sprain 03/16/2015  . Acute sinusitis 01/04/2015  . Vaginal irritation 09/30/2014  . History of abnormal cervical Pap smear 04/02/2014  . Dysmenorrhea 03/23/2014  . Other and unspecified ovarian cyst 01/12/2014  . Menorrhagia 01/05/2014  . Dyspareunia 01/05/2014  . Polyclonal gammopathy 07/26/2013  . Dysplasia of cervix, unspecified 03/31/2013  . DUB (dysfunctional uterine bleeding) 03/10/2013  . Breast lump in female 11/28/2012  . Heartburn 04/18/2012  . Insomnia 12/09/2011  . Asthma in adult 04/18/2011  . Hypertension 09/21/2010  . FATIGUE 12/13/2009  . HEADACHE 01/18/2009  . Morbid obesity ([place]) 11/03/2007  . Generalized anxiety disorder 11/03/2007  . Chronic low back pain 11/03/2007     Prior to Admission medications   Medication Sig Start Date End Date Taking? Authorizing Provider  albuterol (PROVENTIL HFA;VENTOLIN HFA) 108 (90 Base) MCG/ACT inhaler Inhale 2 puffs into the lungs every 4 (four) hours as needed. 08/05/17  Yes [place], Modena Nunnery, MD  amLODipine (NORVASC) 5 MG tablet Take 1 tablet (5 mg total) by mouth daily. 12/11/17  Yes Delsa Grana, PA-C  cyclobenzaprine (  FLEXERIL) 10 MG tablet Take 1 tablet (10 mg total) by mouth 3 (three) times daily as needed for muscle spasms. 02/20/16  Yes Dena Billet B, PA-C  fluticasone (FLONASE) 50 MCG/ACT nasal spray Place 2 sprays into both nostrils as needed. 08/05/17  Yes [place], Modena Nunnery, MD  fluticasone furoate-vilanterol (BREO ELLIPTA) 100-25 MCG/INH AEPB Inhale 1 puff into the lungs daily. 08/05/17  Yes [place], Modena Nunnery, MD  hydrochlorothiazide (HYDRODIURIL) 25 MG tablet Take 1 tablet (25 mg total) by mouth daily. 08/05/17  Yes [place],  Modena Nunnery, MD  ibuprofen (ADVIL,MOTRIN) 600 MG tablet Take 1 tablet (600 mg total) by mouth every 6 (six) hours as needed. 08/05/17  Yes [place], Modena Nunnery, MD  ipratropium (ATROVENT) 0.03 % nasal spray Place 2 sprays into both nostrils every 12 (twelve) hours. 12/11/17  Yes Delsa Grana, PA-C  levocetirizine (XYZAL) 5 MG tablet Take 1 tablet (5 mg total) by mouth every evening. 08/05/17  Yes [place], Modena Nunnery, MD  montelukast (SINGULAIR) 10 MG tablet Take 1 tablet (10 mg total) by mouth at bedtime. 08/27/17  Yes [place], Modena Nunnery, MD  naproxen (NAPROSYN) 500 MG tablet Take 1 tablet (500 mg total) by mouth 2 (two) times daily with a meal. 10/02/17  Yes Carole Civil, MD  Norethin-Eth Estradiol-Fe [facility] FE,WYMZYA FE,ZENCHENT FE,ZEOSA) 0.4-35 MG-MCG tablet CHEW ONE TABLET BY MOUTH ONCE DAILY. 01/31/18  Yes Derrek Monaco A, NP  potassium chloride (K-DUR) 10 MEQ tablet Take 1 tablet (10 mEq total) by mouth daily. 08/05/17  Yes [place], Modena Nunnery, MD  zolpidem (AMBIEN) 10 MG tablet TAKE (1) TABLET BY MOUTH AT BEDTIME. 02/28/16  Yes [place], Modena Nunnery, MD     Allergies  Allergen Reactions  . Banana Rash  . Pineapple Rash     Family History  Problem Relation Age of Onset  . Hypertension Mother   . Diabetes Mother   . Heart disease Mother   . Diabetes Father   . Cancer Paternal Aunt        breast  . Hypertension Maternal Grandmother   . Diabetes Son        pre diabetic  . Other Son        overactive bladder     Social History   Socioeconomic History  . Marital status: Single    Spouse name: Not on file  . Number of children: Not on file  . Years of education: Not on file  . Highest education level: Not on file  Occupational History  . Not on file  Social Needs  . Financial resource strain: Not on file  . Food insecurity:    Worry: Not on file    Inability: Not on file  . Transportation needs:    Medical: Not on file    Non-medical: Not on file  Tobacco Use  . Smoking  status: Never Smoker  . Smokeless tobacco: Never Used  Substance and Sexual Activity  . Alcohol use: No  . Drug use: No  . Sexual activity: Yes    Birth control/protection: Pill  Lifestyle  . Physical activity:    Days per week: Not on file    Minutes per session: Not on file  . Stress: Not on file  Relationships  . Social connections:    Talks on phone: Not on file    Gets together: Not on file    Attends religious service: Not on file    Active member of club or organization: Not on file  Attends meetings of clubs or organizations: Not on file    Relationship status: Not on file  . Intimate partner violence:    Fear of current or ex partner: Not on file    Emotionally abused: Not on file    Physically abused: Not on file    Forced sexual activity: Not on file  Other Topics Concern  . Not on file  Social History Narrative  . Not on file     Review of Systems  Constitutional: Negative.   HENT: Negative.   Eyes: Negative.   Respiratory: Negative.  Negative for apnea, cough, choking, chest tightness, shortness of breath, wheezing and stridor.   Cardiovascular: Negative.  Negative for chest pain, palpitations and leg swelling.  Gastrointestinal: Negative.   Endocrine: Negative.   Genitourinary: Negative.   Musculoskeletal: Negative.   Skin: Negative.   Allergic/Immunologic: Negative.   Neurological: Negative.   Hematological: Negative.   Psychiatric/Behavioral: Negative.   All other systems reviewed and are negative.      Objective:    Vitals:   02/11/18 0824  BP: (!) 134/92  Pulse: 82  Resp: 16  Temp: 98.6 F (37 C)  TempSrc: Oral  SpO2: 98%  Weight: 268 lb (121.6 kg)  Height: 5\' 5"  (1.651 m)      Physical Exam  Constitutional: She appears well-developed and well-nourished. No distress.  HENT:  Head: Normocephalic and atraumatic.  Nose: Nose normal.  Mouth/Throat: Oropharynx is clear and moist. No oropharyngeal exudate.  Eyes: Pupils are  equal, round, and reactive to light. Conjunctivae are normal. Right eye exhibits no discharge. Left eye exhibits no discharge.  Neck: Normal range of motion. No tracheal deviation present.  Cardiovascular: Normal rate, regular rhythm, normal heart sounds and intact distal pulses. Exam reveals no gallop and no friction rub.  No murmur heard. Pulses:      Radial pulses are 2+ on the right side, and 2+ on the left side.       Dorsalis pedis pulses are 2+ on the right side, and 2+ on the left side.       Posterior tibial pulses are 2+ on the right side, and 2+ on the left side.  No lower extremity edema  Pulmonary/Chest: Effort normal and breath sounds normal. No stridor. No respiratory distress. She has no decreased breath sounds. She has no wheezes. She has no rhonchi. She has no rales. She exhibits no tenderness.  Neurological: She is alert. She exhibits normal muscle tone. Coordination normal.  Antalgic gait  Skin: Skin is warm and dry. Capillary refill takes less than 2 seconds. No rash noted. She is not diaphoretic.  Psychiatric: She has a normal mood and affect. Her behavior is normal. Judgment and thought content normal.  Nursing note and vitals reviewed.          Assessment & Plan:      ICD-10-CM   1. Essential hypertension K02 BASIC METABOLIC PANEL WITH GFR    Patient with recheck for essential hypertension, has been noncompliant with last plan for medications and recheck.  Will restart Norvasc, patient was instructed to call us in 2 weeks with readings of her blood pressure.  We will see if 5 mg of Norvasc will help manage or if she needs increase to 10 mg.  She was given 60 pills of 5 mg Norvasc.  We will recheck BMP this morning to reevaluate renal function and also assess fasting blood sugar, although patient currently feels fine, relative to her suspected hypoglycemic  episode.  She will use her son's glucometer if she has recurring symptoms.  Patient's blood pressure may be  elevated for multiple issues, she is seeing allergist, has been dealing with lots of reactivity/allergies, rhinitis sneezing etc., also has had 1 year of gradually worsening right knee pain that is severe, getting worked up currently and is getting MRI today, some of her elevated blood pressure may be secondary to this fairly constant pain, decreased activity and constant stress.  She is also taking NSAIDs nearly daily and this may be contributing to hypertension.  Instructed to take as sparingly as possible, patient also given handout on other ways to manage blood pressure and DASH diet.    Delsa Grana, PA-C 02/11/18 8:41 AM

## 2018-02-11 NOTE — Patient Instructions (Addendum)
Call us in two weeks with some blood pressure readings so we know how to refill your medicine for next month.  Keep your follow up with Dr. Buelah Manis in December, she can follow up on where your BP is at.    Avoid long term use of NSAIDs - this can increase your blood pressure.    We will call you with your labs.  See info below  For BP  Managing Your Hypertension Hypertension is commonly called high blood pressure. This is when the force of your blood pressing against the walls of your arteries is too strong. Arteries are blood vessels that carry blood from your heart throughout your body. Hypertension forces the heart to work harder to pump blood, and may cause the arteries to become narrow or stiff. Having untreated or uncontrolled hypertension can cause heart attack, stroke, kidney disease, and other problems. What are blood pressure readings? A blood pressure reading consists of a higher number over a lower number. Ideally, your blood pressure should be below 120/80. The first ("top") number is called the systolic pressure. It is a measure of the pressure in your arteries as your heart beats. The second ("bottom") number is called the diastolic pressure. It is a measure of the pressure in your arteries as the heart relaxes. What does my blood pressure reading mean? Blood pressure is classified into four stages. Based on your blood pressure reading, your health care provider may use the following stages to determine what type of treatment you need, if any. Systolic pressure and diastolic pressure are measured in a unit called mm Hg. Normal  Systolic pressure: below 185.  Diastolic pressure: below 80. Elevated  Systolic pressure: 631-497.  Diastolic pressure: below 80. Hypertension stage 1  Systolic pressure: 026-378.  Diastolic pressure: 58-85. Hypertension stage 2  Systolic pressure: 027 or above.  Diastolic pressure: 90 or above. What health risks are associated with  hypertension? Managing your hypertension is an important responsibility. Uncontrolled hypertension can lead to:  A heart attack.  A stroke.  A weakened blood vessel (aneurysm).  Heart failure.  Kidney damage.  Eye damage.  Metabolic syndrome.  Memory and concentration problems.  What changes can I make to manage my hypertension? Hypertension can be managed by making lifestyle changes and possibly by taking medicines. Your health care provider will help you make a plan to bring your blood pressure within a normal range. Eating and drinking  Eat a diet that is high in fiber and potassium, and low in salt (sodium), added sugar, and fat. An example eating plan is called the DASH (Dietary Approaches to Stop Hypertension) diet. To eat this way: ? Eat plenty of fresh fruits and vegetables. Try to fill half of your plate at each meal with fruits and vegetables. ? Eat whole grains, such as whole wheat pasta, brown rice, or whole grain bread. Fill about one quarter of your plate with whole grains. ? Eat low-fat diary products. ? Avoid fatty cuts of meat, processed or cured meats, and poultry with skin. Fill about one quarter of your plate with lean proteins such as fish, chicken without skin, beans, eggs, and tofu. ? Avoid premade and processed foods. These tend to be higher in sodium, added sugar, and fat.  Reduce your daily sodium intake. Most people with hypertension should eat less than 1,500 mg of sodium a day.  Limit alcohol intake to no more than 1 drink a day for nonpregnant women and 2 drinks a day for men.  One drink equals 12 oz of beer, 5 oz of wine, or 1 oz of hard liquor. Lifestyle  Work with your health care provider to maintain a healthy body weight, or to lose weight. Ask what an ideal weight is for you.  Get at least 30 minutes of exercise that causes your heart to beat faster (aerobic exercise) most days of the week. Activities may include walking, swimming, or  biking.  Include exercise to strengthen your muscles (resistance exercise), such as weight lifting, as part of your weekly exercise routine. Try to do these types of exercises for 30 minutes at least 3 days a week.  Do not use any products that contain nicotine or tobacco, such as cigarettes and e-cigarettes. If you need help quitting, ask your health care provider.  Control any long-term (chronic) conditions you have, such as high cholesterol or diabetes. Monitoring  Monitor your blood pressure at home as told by your health care provider. Your personal target blood pressure may vary depending on your medical conditions, your age, and other factors.  Have your blood pressure checked regularly, as often as told by your health care provider. Working with your health care provider  Review all the medicines you take with your health care provider because there may be side effects or interactions.  Talk with your health care provider about your diet, exercise habits, and other lifestyle factors that may be contributing to hypertension.  Visit your health care provider regularly. Your health care provider can help you create and adjust your plan for managing hypertension. Will I need medicine to control my blood pressure? Your health care provider may prescribe medicine if lifestyle changes are not enough to get your blood pressure under control, and if:  Your systolic blood pressure is 130 or higher.  Your diastolic blood pressure is 80 or higher.  Take medicines only as told by your health care provider. Follow the directions carefully. Blood pressure medicines must be taken as prescribed. The medicine does not work as well when you skip doses. Skipping doses also puts you at risk for problems. Contact a health care provider if:  You think you are having a reaction to medicines you have taken.  You have repeated (recurrent) headaches.  You feel dizzy.  You have swelling in your  ankles.  You have trouble with your vision. Get help right away if:  You develop a severe headache or confusion.  You have unusual weakness or numbness, or you feel faint.  You have severe pain in your chest or abdomen.  You vomit repeatedly.  You have trouble breathing. Summary  Hypertension is when the force of blood pumping through your arteries is too strong. If this condition is not controlled, it may put you at risk for serious complications.  Your personal target blood pressure may vary depending on your medical conditions, your age, and other factors. For most people, a normal blood pressure is less than 120/80.  Hypertension is managed by lifestyle changes, medicines, or both. Lifestyle changes include weight loss, eating a healthy, low-sodium diet, exercising more, and limiting alcohol. This information is not intended to replace advice given to you by your health care provider. Make sure you discuss any questions you have with your health care provider. Document Released: 01/09/2012 Document Revised: 03/14/2016 Document Reviewed: 03/14/2016 Elsevier Interactive Patient Education  2018 [place] Eating Plan DASH stands for "Dietary Approaches to Stop Hypertension." The DASH eating plan is a healthy eating plan  that has been shown to reduce high blood pressure (hypertension). It may also reduce your risk for type 2 diabetes, heart disease, and stroke. The DASH eating plan may also help with weight loss. What are tips for following this plan? General guidelines  Avoid eating more than 2,300 mg (milligrams) of salt (sodium) a day. If you have hypertension, you may need to reduce your sodium intake to 1,500 mg a day.  Limit alcohol intake to no more than 1 drink a day for nonpregnant women and 2 drinks a day for men. One drink equals 12 oz of beer, 5 oz of wine, or 1 oz of hard liquor.  Work with your health care provider to maintain a healthy body weight or to  lose weight. Ask what an ideal weight is for you.  Get at least 30 minutes of exercise that causes your heart to beat faster (aerobic exercise) most days of the week. Activities may include walking, swimming, or biking.  Work with your health care provider or diet and nutrition specialist (dietitian) to adjust your eating plan to your individual calorie needs. Reading food labels  Check food labels for the amount of sodium per serving. Choose foods with less than 5 percent of the Daily Value of sodium. Generally, foods with less than 300 mg of sodium per serving fit into this eating plan.  To find whole grains, look for the word "whole" as the first word in the ingredient list. Shopping  Buy products labeled as "low-sodium" or "no salt added."  Buy fresh foods. Avoid canned foods and premade or frozen meals. Cooking  Avoid adding salt when cooking. Use salt-free seasonings or herbs instead of table salt or sea salt. Check with your health care provider or pharmacist before using salt substitutes.  Do not fry foods. Cook foods using healthy methods such as baking, boiling, grilling, and broiling instead.  Cook with heart-healthy oils, such as olive, canola, soybean, or sunflower oil. Meal planning   Eat a balanced diet that includes: ? 5 or more servings of fruits and vegetables each day. At each meal, try to fill half of your plate with fruits and vegetables. ? Up to 6-8 servings of whole grains each day. ? Less than 6 oz of lean meat, poultry, or fish each day. A 3-oz serving of meat is about the same size as a deck of cards. One egg equals 1 oz. ? 2 servings of low-fat dairy each day. ? A serving of nuts, seeds, or beans 5 times each week. ? Heart-healthy fats. Healthy fats called Omega-3 fatty acids are found in foods such as flaxseeds and coldwater fish, like sardines, salmon, and mackerel.  Limit how much you eat of the following: ? Canned or prepackaged foods. ? Food that is  high in trans fat, such as fried foods. ? Food that is high in saturated fat, such as fatty meat. ? Sweets, desserts, sugary drinks, and other foods with added sugar. ? Full-fat dairy products.  Do not salt foods before eating.  Try to eat at least 2 vegetarian meals each week.  Eat more home-cooked food and less restaurant, buffet, and fast food.  When eating at a restaurant, ask that your food be prepared with less salt or no salt, if possible. What foods are recommended? The items listed may not be a complete list. Talk with your dietitian about what dietary choices are best for you. Grains Whole-grain or whole-wheat bread. Whole-grain or whole-wheat pasta. Brown rice. [place].  Bulgur. Whole-grain and low-sodium cereals. Pita bread. Low-fat, low-sodium crackers. Whole-wheat flour tortillas. Vegetables Fresh or frozen vegetables (raw, steamed, roasted, or grilled). Low-sodium or reduced-sodium tomato and vegetable juice. Low-sodium or reduced-sodium tomato sauce and tomato paste. Low-sodium or reduced-sodium canned vegetables. Fruits All fresh, dried, or frozen fruit. Canned fruit in natural juice (without added sugar). Meat and other protein foods Skinless chicken or [place]. Ground chicken or [place]. Pork with fat trimmed off. Fish and seafood. Egg whites. Dried beans, peas, or lentils. Unsalted nuts, nut butters, and seeds. Unsalted canned beans. Lean cuts of beef with fat trimmed off. Low-sodium, lean deli meat. Dairy Low-fat (1%) or fat-free (skim) milk. Fat-free, low-fat, or reduced-fat cheeses. Nonfat, low-sodium ricotta or cottage cheese. Low-fat or nonfat yogurt. Low-fat, low-sodium cheese. Fats and oils Soft margarine without trans fats. Vegetable oil. Low-fat, reduced-fat, or light mayonnaise and salad dressings (reduced-sodium). Canola, safflower, olive, soybean, and sunflower oils. Avocado. Seasoning and other foods Herbs. Spices. Seasoning mixes without salt.  Unsalted popcorn and pretzels. Fat-free sweets. What foods are not recommended? The items listed may not be a complete list. Talk with your dietitian about what dietary choices are best for you. Grains Baked goods made with fat, such as croissants, muffins, or some breads. Dry pasta or rice meal packs. Vegetables Creamed or fried vegetables. Vegetables in a cheese sauce. Regular canned vegetables (not low-sodium or reduced-sodium). Regular canned tomato sauce and paste (not low-sodium or reduced-sodium). Regular tomato and vegetable juice (not low-sodium or reduced-sodium). Angie Fava. Olives. Fruits Canned fruit in a light or heavy syrup. Fried fruit. Fruit in cream or butter sauce. Meat and other protein foods Fatty cuts of meat. Ribs. Fried meat. Berniece Salines. Sausage. Bologna and other processed lunch meats. Salami. Fatback. Hotdogs. Bratwurst. Salted nuts and seeds. Canned beans with added salt. Canned or smoked fish. Whole eggs or egg yolks. Chicken or [place] with skin. Dairy Whole or 2% milk, cream, and half-and-half. Whole or full-fat cream cheese. Whole-fat or sweetened yogurt. Full-fat cheese. Nondairy creamers. Whipped toppings. Processed cheese and cheese spreads. Fats and oils Butter. Stick margarine. Lard. Shortening. Ghee. Bacon fat. Tropical oils, such as coconut, palm kernel, or palm oil. Seasoning and other foods Salted popcorn and pretzels. Onion salt, garlic salt, seasoned salt, table salt, and sea salt. Worcestershire sauce. Tartar sauce. Barbecue sauce. Teriyaki sauce. Soy sauce, including reduced-sodium. Steak sauce. Canned and packaged gravies. Fish sauce. Oyster sauce. Cocktail sauce. Horseradish that you find on the shelf. Ketchup. Mustard. Meat flavorings and tenderizers. Bouillon cubes. Hot sauce and Tabasco sauce. Premade or packaged marinades. Premade or packaged taco seasonings. Relishes. Regular salad dressings. Where to find more information:  National Heart, Lung, and [place]: https://wilson-eaton.com/  American Heart Association: www.heart.org Summary  The DASH eating plan is a healthy eating plan that has been shown to reduce high blood pressure (hypertension). It may also reduce your risk for type 2 diabetes, heart disease, and stroke.  With the DASH eating plan, you should limit salt (sodium) intake to 2,300 mg a day. If you have hypertension, you may need to reduce your sodium intake to 1,500 mg a day.  When on the DASH eating plan, aim to eat more fresh fruits and vegetables, whole grains, lean proteins, low-fat dairy, and heart-healthy fats.  Work with your health care provider or diet and nutrition specialist (dietitian) to adjust your eating plan to your individual calorie needs. This information is not intended to replace advice given to you by your health care provider. Make  sure you discuss any questions you have with your health care provider. Document Released: 04/05/2011 Document Revised: 04/09/2016 Document Reviewed: 04/09/2016 Elsevier Interactive Patient Education  [employer].

## 2018-02-12 ENCOUNTER — Encounter: Payer: Self-pay | Admitting: Allergy & Immunology

## 2018-02-12 ENCOUNTER — Ambulatory Visit (INDEPENDENT_AMBULATORY_CARE_PROVIDER_SITE_OTHER): Payer: Medicaid Other | Admitting: Allergy & Immunology

## 2018-02-12 VITALS — BP 122/90 | HR 109 | Temp 98.1°F | Resp 16 | Ht 65.25 in | Wt 265.4 lb

## 2018-02-12 DIAGNOSIS — T781XXD Other adverse food reactions, not elsewhere classified, subsequent encounter: Secondary | ICD-10-CM | POA: Diagnosis not present

## 2018-02-12 DIAGNOSIS — J302 Other seasonal allergic rhinitis: Secondary | ICD-10-CM | POA: Diagnosis not present

## 2018-02-12 DIAGNOSIS — J4540 Moderate persistent asthma, uncomplicated: Secondary | ICD-10-CM | POA: Diagnosis not present

## 2018-02-12 DIAGNOSIS — J3089 Other allergic rhinitis: Secondary | ICD-10-CM

## 2018-02-12 DIAGNOSIS — J4530 Mild persistent asthma, uncomplicated: Secondary | ICD-10-CM | POA: Insufficient documentation

## 2018-02-12 MED ORDER — BEPOTASTINE BESILATE 1.5 % OP SOLN: 1.0000 [drp] | Freq: Two times a day (BID) | OPHTHALMIC | 1 refills | Status: DC | PRN

## 2018-02-12 MED ORDER — AZELASTINE HCL 0.15 % NA SOLN: NASAL | 1 refills | Status: DC

## 2018-02-12 NOTE — Patient Instructions (Addendum)
1. Moderate persistent asthma, uncomplicated - Lung testing looked great today.  Memory Dance is an excellent medication so we will stick with that.  - Hopefully controlling your environmental allergies will help to control your asthma more as well. - Daily controller medication(s): Singulair 10mg  daily and Breo 200/94mcg one puff once daily - Prior to physical activity: Proventil 2 puffs 10-15 minutes before physical activity. - Rescue medications: Proventil 4 puffs every 4-6 hours as needed - Asthma control goals:  * Full participation in all desired activities (may need albuterol before activity) * Albuterol use two time or less a week on average (not counting use with activity) * Cough interfering with sleep two time or less a month * Oral steroids no more than once a year * No hospitalizations  2. Chronic rhinitis - Testing today showed: trees, indoor molds, outdoor molds and cat - Avoidance measures provided. - Stop taking: Allegra - Continue with: Singulair (montelukast) 10mg  daily, Flonase (fluticasone) two sprays per nostril daily and Bepreve (bepotastine) one drop per eye twice daily as needed) - Start taking: Astelin (azelastine) 2 sprays per nostril 1-2 times daily as needed - You can use an extra dose of the antihistamine, if needed, for breakthrough symptoms.  - Consider nasal saline rinses 1-2 times daily to remove allergens from the nasal cavities as well as help with mucous clearance (this is especially helpful to do before the nasal sprays are given) - Consider allergy shots as a means of long-term control. - Allergy shots "re-train" and "reset" the immune system to ignore environmental allergens and decrease the resulting immune response to those allergens (sneezing, itchy watery eyes, runny nose, nasal congestion, etc).    - Allergy shots improve symptoms in 75-85% of patients.  - We can discuss more at the next appointment if the medications are not working for you.  3.  Adverse food reaction - Testing was negative to banana and pineapple. - There is a the low positive predictive value of food allergy testing and hence the high possibility of false positives. - In contrast, food allergy testing has a high negative predictive value, therefore if testing is negative we can be relatively assured that they are indeed negative.  - I am unsure how to explain your symptoms, but I would continue to avoid for now.   4. Return in about 6 weeks (around 03/26/2018).   Please inform us of any Emergency Department visits, hospitalizations, or changes in symptoms. Call us before going to the ED for breathing or allergy symptoms since we might be able to fit you in for a sick visit. Feel free to contact us anytime with any questions, problems, or concerns.  It was a pleasure to meet you today!  Websites that have reliable patient information: 1. American Academy of Asthma, Allergy, and Immunology: www.aaaai.org 2. Food Allergy Research and Education (FARE): foodallergy.org 3. Mothers of Asthmatics: http://www.asthmacommunitynetwork.org 4. American College of Allergy, Asthma, and Immunology: MonthlyElectricBill.co.uk   Make sure you are registered to vote! If you have moved or changed any of your contact information, you will need to get this updated before voting!

## 2018-02-12 NOTE — Progress Notes (Signed)
NEW PATIENT  Date of Service/Encounter:  02/12/18  Referring provider: Alycia Rossetti, MD   Assessment:    Moderate persistent asthma, uncomplicated  Seasonal and perennial allergic rhinitis (trees, indoor molds, outdoor molds and cat)  Adverse food reaction (pineapple, banana) - with negative testing today    Plan/Recommendations:   1. Moderate persistent asthma, uncomplicated - Lung testing looked great today.  Memory Dance is an excellent medication so we will stick with that.  - Hopefully controlling your environmental allergies will help to control your asthma more as well. - Daily controller medication(s): Singulair 10mg  daily and Breo 200/65mcg one puff once daily - Prior to physical activity: Proventil 2 puffs 10-15 minutes before physical activity. - Rescue medications: Proventil 4 puffs every 4-6 hours as needed - Asthma control goals:  * Full participation in all desired activities (may need albuterol before activity) * Albuterol use two time or less a week on average (not counting use with activity) * Cough interfering with sleep two time or less a month * Oral steroids no more than once a year * No hospitalizations  2. Chronic rhinitis - Testing today showed: trees, indoor molds, outdoor molds and cat - Avoidance measures provided. - Stop taking: Allegra - Continue with: Singulair (montelukast) 10mg  daily, Flonase (fluticasone) two sprays per nostril daily and Bepreve (bepotastine) one drop per eye twice daily as needed) - Start taking: Astelin (azelastine) 2 sprays per nostril 1-2 times daily as needed - You can use an extra dose of the antihistamine, if needed, for breakthrough symptoms.  - Consider nasal saline rinses 1-2 times daily to remove allergens from the nasal cavities as well as help with mucous clearance (this is especially helpful to do before the nasal sprays are given) - Consider allergy shots as a means of long-term control. - Allergy shots  "re-train" and "reset" the immune system to ignore environmental allergens and decrease the resulting immune response to those allergens (sneezing, itchy watery eyes, runny nose, nasal congestion, etc).    - Allergy shots improve symptoms in 75-85% of patients.  - We can discuss more at the next appointment if the medications are not working for you.  3. Adverse food reaction - Testing was negative to banana and pineapple. - There is a the low positive predictive value of food allergy testing and hence the high possibility of false positives. - In contrast, food allergy testing has a high negative predictive value, therefore if testing is negative we can be relatively assured that they are indeed negative.  - I am unsure how to explain your symptoms, but I would continue to avoid for now.   4. Return in about 6 weeks (around 03/26/2018).  Subjective:   [name] is a 37 y.o. female presenting today for evaluation of  Chief Complaint  Patient presents with  . Allergies  . Asthma  . Sinusitis  . Burning Eyes  . Nasal Congestion    [name] has a history of the following: Patient Active Problem List   Diagnosis Date Noted  . Moderate persistent asthma, uncomplicated 59/56/3875  . Seasonal and perennial allergic rhinitis 02/12/2018  . Dizzy 03/12/2017  . Screening examination for STD (sexually transmitted disease) 03/12/2017  . Abnormal uterine bleeding (AUB) 03/12/2017  . Cysts of both ovaries 03/12/2017  . Plantar fasciitis 12/16/2015  . Allergic rhinitis 09/14/2015  . Fibroids, intramural 08/24/2015  . LLQ abdominal tenderness 05/31/2015  . Chronic breast pain 05/31/2015  . Glucose intolerance (impaired  glucose tolerance) 03/16/2015  . Knee sprain 03/16/2015  . Acute sinusitis 01/04/2015  . Vaginal irritation 09/30/2014  . History of abnormal cervical Pap smear 04/02/2014  . Dysmenorrhea 03/23/2014  . Other and unspecified ovarian cyst 01/12/2014    . Menorrhagia 01/05/2014  . Dyspareunia 01/05/2014  . Polyclonal gammopathy 07/26/2013  . Dysplasia of cervix, unspecified 03/31/2013  . DUB (dysfunctional uterine bleeding) 03/10/2013  . Breast lump in female 11/28/2012  . Heartburn 04/18/2012  . Insomnia 12/09/2011  . Asthma in adult 04/18/2011  . Hypertension 09/21/2010  . FATIGUE 12/13/2009  . HEADACHE 01/18/2009  . Morbid obesity ([place]) 11/03/2007  . Generalized anxiety disorder 11/03/2007  . Chronic low back pain 11/03/2007    History obtained from: chart review and patient.  [name] was referred by Alycia Rossetti, MD.     [name] is a 37 y.o. female presenting for an evaluation of allergies and asthma.  The patient was born in [place] and grew up here.    Asthma/Respiratory Symptom History: She was diagnosed with asthma approximately 3 years ago.  Since that time, and has continued to worsen.  She was initially placed on a rescue inhaler, and initially Ventolin but now Proventil.  However, Memory Dance was added this summer.  The addition of the Memory Dance has helped to decrease the use of her Proventil.  However, when it was warm and humid a couple of weeks ago, she was using her Proventil more often.  She estimates that she has needed 3 courses of prednisone in 2019.  She has never needed any ER visits, hospitalizations, or urgent care visits.  She is unsure have her triggers, but knows that heat and humidity definitely make her symptoms worse.  She estimates that she coughs at night around 2 times per week.  Allergic Rhinitis Symptom History: She has a history of environmental allergy symptoms including sneezing and rhinorrhea.  These started in 2019 in the spring and have continued unabated since then.  She has been on Zyrtec, Singulair, Claritin, and Benadryl.  She feels that Benadryl works the best.  She is unsure if Claritin does anything.  She does have Flonase which was added in early 2019.  She occasionally will use  a nasal decongestion.  She notices no triggers that seem to make her symptoms worse.  She does report that she gets sinusitis around 4 times per calendar year.  This has really gotten bad in the last 2 to 3 years.  He otherwise did not get any other antibiotics.  Food Allergy Symptom History: She reports a history of tongue itching with exposure to banana as well as hives head to toe with exposure to pineapple.  She does not have an epinephrine autoinjector.  Otherwise, there is no history of other atopic diseases, including drug allergies, stinging insect allergies, eczema or urticaria. There is no significant infectious history. Vaccinations are up to date.    Past Medical History: Patient Active Problem List   Diagnosis Date Noted  . Moderate persistent asthma, uncomplicated 67/34/1935  . Seasonal and perennial allergic rhinitis 02/12/2018  . Dizzy 03/12/2017  . Screening examination for STD (sexually transmitted disease) 03/12/2017  . Abnormal uterine bleeding (AUB) 03/12/2017  . Cysts of both ovaries 03/12/2017  . Plantar fasciitis 12/16/2015  . Allergic rhinitis 09/14/2015  . Fibroids, intramural 08/24/2015  . LLQ abdominal tenderness 05/31/2015  . Chronic breast pain 05/31/2015  . Glucose intolerance (impaired glucose tolerance) 03/16/2015  . Knee sprain 03/16/2015  . Acute sinusitis  01/04/2015  . Vaginal irritation 09/30/2014  . History of abnormal cervical Pap smear 04/02/2014  . Dysmenorrhea 03/23/2014  . Other and unspecified ovarian cyst 01/12/2014  . Menorrhagia 01/05/2014  . Dyspareunia 01/05/2014  . Polyclonal gammopathy 07/26/2013  . Dysplasia of cervix, unspecified 03/31/2013  . DUB (dysfunctional uterine bleeding) 03/10/2013  . Breast lump in female 11/28/2012  . Heartburn 04/18/2012  . Insomnia 12/09/2011  . Asthma in adult 04/18/2011  . Hypertension 09/21/2010  . FATIGUE 12/13/2009  . HEADACHE 01/18/2009  . Morbid obesity ([place]) 11/03/2007  . Generalized  anxiety disorder 11/03/2007  . Chronic low back pain 11/03/2007    Medication List:  Allergies as of 02/12/2018      Reactions   Banana Rash   Pineapple Rash      Medication List        Accurate as of 02/12/18  1:41 PM. Always use your most recent med list.          albuterol 108 (90 Base) MCG/ACT inhaler Commonly known as:  PROVENTIL HFA;VENTOLIN HFA Inhale 2 puffs into the lungs every 4 (four) hours as needed.   amLODipine 5 MG tablet Commonly known as:  NORVASC Take 1 tablet (5 mg total) by mouth daily.   amLODipine 5 MG tablet Commonly known as:  NORVASC Take 1-2 tablets (5-10 mg total) by mouth daily.   cyclobenzaprine 10 MG tablet Commonly known as:  FLEXERIL Take 1 tablet (10 mg total) by mouth 3 (three) times daily as needed for muscle spasms.   fluticasone 50 MCG/ACT nasal spray Commonly known as:  FLONASE Place 2 sprays into both nostrils as needed.   fluticasone furoate-vilanterol 100-25 MCG/INH Aepb Commonly known as:  BREO ELLIPTA Inhale 1 puff into the lungs daily.   hydrochlorothiazide 25 MG tablet Commonly known as:  HYDRODIURIL Take 1 tablet (25 mg total) by mouth daily.   ibuprofen 600 MG tablet Commonly known as:  ADVIL,MOTRIN Take 1 tablet (600 mg total) by mouth every 6 (six) hours as needed.   ipratropium 0.03 % nasal spray Commonly known as:  ATROVENT Place 2 sprays into both nostrils every 12 (twelve) hours.   levocetirizine 5 MG tablet Commonly known as:  XYZAL Take 1 tablet (5 mg total) by mouth every evening.   montelukast 10 MG tablet Commonly known as:  SINGULAIR Take 1 tablet (10 mg total) by mouth at bedtime.   naproxen 500 MG tablet Commonly known as:  NAPROSYN Take 1 tablet (500 mg total) by mouth 2 (two) times daily with a meal.   Norethin-Eth Estradiol-Fe 0.4-35 MG-MCG tablet Commonly known as:  FEMCON FE,WYMZYA FE,ZENCHENT FE,ZEOSA CHEW ONE TABLET BY MOUTH ONCE DAILY.   potassium chloride 10 MEQ  tablet Commonly known as:  K-DUR Take 1 tablet (10 mEq total) by mouth daily.   zolpidem 10 MG tablet Commonly known as:  AMBIEN TAKE (1) TABLET BY MOUTH AT BEDTIME.       Birth History: non-contributory  Developmental History: non-contributory.   Past Surgical History: Past Surgical History:  Procedure Laterality Date  . ADENOIDECTOMY    . DILATION AND CURETTAGE OF UTERUS    . TONSILLECTOMY       Family History: Family History  Problem Relation Age of Onset  . Hypertension Mother   . Diabetes Mother   . Heart disease Mother   . Diabetes Father   . Cancer Paternal Aunt        breast  . Hypertension Maternal Grandmother   . Diabetes Son  pre diabetic  . Other Son        overactive bladder     Social History: Hodaya lives at home with her 10yo son and fiance. She lives in a house that is over 51 years old. There is hardwood throughout the home. She has gas heating and kerosene heating. She has central and window units for cooling. There are no animals inside or outside of the home. There are no dust mite coverings on the bedding. There is tobacco exposure in the home.  She currently works as a Optician, dispensing in the mornings.  She also sells cell phones in the afternoons, but she is on limited activity due to a knee injury at this time.  She initially started having knee symptoms when she was working at [employer] approximately 2 years ago.  She sees Dr. Aline Brochure for this.    Review of Systems: a 14-point review of systems is pertinent for what is mentioned in HPI.  Otherwise, all other systems were negative. Constitutional: negative other than that listed in the HPI Eyes: negative other than that listed in the HPI Ears, nose, mouth, throat, and face: negative other than that listed in the HPI Respiratory: negative other than that listed in the HPI Cardiovascular: negative other than that listed in the HPI Gastrointestinal: negative other than that listed  in the HPI Genitourinary: negative other than that listed in the HPI Integument: negative other than that listed in the HPI Hematologic: negative other than that listed in the HPI Musculoskeletal: negative other than that listed in the HPI Neurological: negative other than that listed in the HPI Allergy/Immunologic: negative other than that listed in the HPI    Objective:   Blood pressure 122/90, pulse (!) 109, temperature 98.1 F (36.7 C), temperature source Oral, resp. rate 16, height 5' 5.25" (1.657 m), weight 265 lb 6.4 oz (120.4 kg), last menstrual period 01/28/2018, SpO2 98 %. Body mass index is 43.83 kg/m.   Physical Exam:  General: Alert, interactive, in no acute distress. Pleasant and talkative female.  Eyes: No conjunctival injection bilaterally, no discharge on the right, no discharge on the left and no Horner-Trantas dots present. PERRL bilaterally. EOMI without pain. No photophobia.  Ears: Right TM unable to be visualized due to cerumen impaction and Left TM unable to be visualized due to cerumen impaction.  Nose/Throat: External nose within normal limits, nasal crease present and septum midline. Turbinates markedly edematous with clear discharge. Posterior oropharynx erythematous with cobblestoning in the posterior oropharynx. Tonsils 2+ without exudates.  Tongue without thrush and Geographic tongue present. Neck: Supple without thyromegaly. Trachea midline. Adenopathy: shoddy bilateral anterior cervical lymphadenopathy and no enlarged lymph nodes appreciated in the occipital, axillary, epitrochlear, inguinal, or popliteal regions. Lungs: Clear to auscultation without wheezing, rhonchi or rales. No increased work of breathing. CV: Normal S1/S2. No murmurs. Capillary refill <2 seconds.  Abdomen: Nondistended, nontender. No guarding or rebound tenderness. Bowel sounds present in all fields and hyperactive  Skin: Warm and dry, without lesions or rashes. Extremities:  No  clubbing, cyanosis or edema. Neuro:   Grossly intact. No focal deficits appreciated. Responsive to questions.  Diagnostic studies:   Spirometry: results normal (FEV1: 2.62/104%, FVC: 3.25/101%, FEV1/FVC: 80%).    Spirometry consistent with normal pattern.   Allergy Studies:   Airborne Adult Perc - 2018-02-21 0923    Time Antigen Placed  5784    Allergen Manufacturer  Lavella Hammock    Location  Back    Number of Test  59    Panel 1  Select    1. Control-Buffer 50% Glycerol  Negative    2. Control-Histamine 1 mg/ml  2+    3. Albumin saline  Negative    4. [place]  Negative    5. [place]  Negative    6. Johnson  Negative    7. Midland Blue  Negative    8. Meadow Fescue  Negative    9. Perennial Rye  Negative    10. Sweet Vernal  Negative    11. Timothy  Negative    12. Cocklebur  Negative    13. Burweed Marshelder  Negative    14. Ragweed, short  Negative    15. Ragweed, Giant  Negative    16. Plantain,  English  Negative    17. Lamb's Quarters  Negative    18. Sheep Sorrell  Negative    19. Rough Pigweed  Negative    20. Marsh Elder, Rough  Negative    21. Mugwort, Common  Negative    22. Ash mix  Negative    23. Birch mix  Negative    24. Beech American  Negative    25. Box, Elder  Negative    26. Cedar, red  Negative    27. Cottonwood, [place]  Negative    28. Elm mix  Negative    29. Hickory mix  Negative    30. Maple mix  Negative    31. Oak, [place] mix  Negative    32. Pecan Pollen  2+    33. Pine mix  Negative    34. Sycamore Eastern  Negative    35. Inverness, Black Pollen  Negative    36. Alternaria alternata  Negative    37. Cladosporium Herbarum  Negative    38. Aspergillus mix  Negative    39. Penicillium mix  Negative    40. Bipolaris sorokiniana (Helminthosporium)  Negative    41. Drechslera spicifera (Curvularia)  2+    42. Mucor plumbeus  Negative    43. Fusarium moniliforme  2+    44. Aureobasidium pullulans (pullulara)  Negative    45. Rhizopus oryzae   Negative    46. Botrytis cinera  Negative    47. Epicoccum nigrum  Negative    48. Phoma betae  Negative    49. Candida Albicans  Negative    50. Trichophyton mentagrophytes  Negative    51. Mite, D Farinae  5,000 AU/ml  Negative    52. Mite, D Pteronyssinus  5,000 AU/ml  Negative    53. Cat Hair 10,000 BAU/ml  Negative    54.  Dog Epithelia  Negative    55. Mixed Feathers  Negative    56. Horse Epithelia  Negative    57. Cockroach, German  Negative    58. Mouse  Negative    59. Tobacco Leaf  Negative     Intradermal - 02/12/18 0954    Time Antigen Placed  2426    Allergen Manufacturer  Lavella Hammock    Location  Arm    Number of Test  13    Intradermal  Select    Control  Negative    Guatem[place]  Negative    Johnson  Negative    7 Grass  Negative    Ragweed mix  Negative    Weed mix  Negative    Tree mix  3+    Mold 1  Negative    Mold 2  1+  Cat  2+    Dog  Negative    Cockroach  Negative    Mite mix  Negative     Food Adult Perc - 02/12/18 0924    Time Antigen Placed  4158    Allergen Manufacturer  Lavella Hammock    Location  Back    Number of allergen test  2     Control-buffer 50% Glycerol  Negative    Control-Histamine 1 mg/ml  2+    57. Banana  Negative    63. Pineapple  Negative        Allergy testing results were read and interpreted by myself, documented by clinical staff.       Salvatore Marvel, MD [facility]

## 2018-02-13 ENCOUNTER — Encounter: Payer: Self-pay | Admitting: *Deleted

## 2018-02-17 ENCOUNTER — Ambulatory Visit: Payer: Medicaid Other | Admitting: Orthopedic Surgery

## 2018-02-17 ENCOUNTER — Encounter: Payer: Self-pay | Admitting: Allergy & Immunology

## 2018-02-17 ENCOUNTER — Encounter: Payer: Self-pay | Admitting: Orthopedic Surgery

## 2018-02-17 VITALS — BP 142/98 | HR 113 | Ht 65.0 in | Wt 265.0 lb

## 2018-02-17 DIAGNOSIS — Z6841 Body Mass Index (BMI) 40.0 and over, adult: Secondary | ICD-10-CM

## 2018-02-17 DIAGNOSIS — M25561 Pain in right knee: Secondary | ICD-10-CM

## 2018-02-17 DIAGNOSIS — G8929 Other chronic pain: Secondary | ICD-10-CM | POA: Diagnosis not present

## 2018-02-17 DIAGNOSIS — M2391 Unspecified internal derangement of right knee: Secondary | ICD-10-CM | POA: Diagnosis not present

## 2018-02-17 NOTE — Progress Notes (Signed)
FOLLOW UP VISIT : MRI RESULTS   Chief Complaint  Patient presents with  . Knee Pain    Right knee MRI results     HPI: The patient is here TO DISCUSS THE RESULTS OF MRI  37 year old female history of right knee pain for 1 year comes in for MRI follow-up with similar complaints   Review of Systems  Constitutional: Negative for chills and fever.  Musculoskeletal:       Acute onset right leg pain which has resolved.  Patient with a history of lumbar disc pain   No changes since October 7  BP (!) 142/98   Pulse (!) 113   Ht 5\' 5"  (1.651 m)   Wt 265 lb (120.2 kg)   LMP 01/28/2018 Comment: regular  BMI 44.10 kg/m     Medical decision-making section   DATA  MRI REPORT:  CLINICAL DATA:  Right knee pain for 1 year.  No known injury.   EXAM: MRI OF THE RIGHT KNEE WITHOUT CONTRAST   TECHNIQUE: Multiplanar, multisequence MR imaging of the knee was performed. No intravenous contrast was administered.   COMPARISON:  None.   FINDINGS: MENISCI   Medial meniscus:  Intact.   Lateral meniscus:  Intact.   LIGAMENTS   Cruciates:  Intact ACL and PCL.   Collaterals: Medial collateral ligament is intact. Lateral collateral ligament complex is intact.   CARTILAGE   Patellofemoral:  No chondral defect.   Medial:  No chondral defect.   Lateral:  No chondral defect.   Joint: No joint effusion. Normal Hoffa's fat. No plical thickening.   Popliteal Fossa:  No Baker cyst.  Intact popliteus tendon.   Extensor Mechanism: Intact quadriceps tendon. Intact patellar tendon. Intact medial patellar retinaculum. Intact lateral patellar retinaculum. Intact MPFL.   Bones:  No acute osseous abnormality.  No aggressive osseous lesion.   Other: No fluid collection or hematoma.  Muscles are normal.   IMPRESSION: 1. No internal derangement of the right knee.     Electronically Signed   By: Kathreen Devoid   On: 02/11/2018 14:07    MY READING: MRI OF THE standard multiplanar  imaging MRI right knee: All major structures intact no effusion no cartilage damage ligaments normal  Encounter Diagnoses  Name Primary?  . Chronic pain of right knee Yes  . Internal derangement of right knee   . Body mass index 40.0-44.9, adult ([place])   . Morbid obesity ([place])    The patient meets the AMA guidelines for Morbid (severe) obesity with a BMI > 40.0 and I have recommended weight loss.  PLAN:   Recommendation is that the patient be seen for nutrition consult for BMI over 40 as well as primary care can make referral to pain management and neurosurgery to see if the back is the cause of her symptoms

## 2018-02-17 NOTE — Patient Instructions (Signed)
Please call primary care physician's office asked them if they are still planning to do MRI of the back and asked them about nutrition consult for weight loss

## 2018-02-18 ENCOUNTER — Other Ambulatory Visit: Payer: Self-pay | Admitting: *Deleted

## 2018-02-18 ENCOUNTER — Telehealth: Payer: Self-pay

## 2018-02-18 NOTE — Telephone Encounter (Signed)
We can substitute azelastine 0.1% two sprays per nostril up to twice daily.   Salvatore Marvel, MD [facility] [place]

## 2018-02-18 NOTE — Telephone Encounter (Signed)
Patient has started taking Azelastine since her last visit with you on 02/12/18. Just to let you know.

## 2018-02-18 NOTE — Telephone Encounter (Signed)
Patient's pharmacy states that [name] is no longer available. Alternatives are fluticasone, azelastine 0.1%, ipratropium, generic Patanase. Please advise and thank you.

## 2018-02-26 ENCOUNTER — Telehealth: Payer: Self-pay | Admitting: Adult Health

## 2018-02-26 MED ORDER — NORETHIN-ETH ESTRADIOL-FE 0.4-35 MG-MCG PO CHEW: CHEWABLE_TABLET | ORAL | 3 refills | Status: DC

## 2018-02-26 NOTE — Telephone Encounter (Signed)
Pt aware that OCs refilled

## 2018-02-26 NOTE — Telephone Encounter (Signed)
Patient called stating that she would like for [facility] to call her in a refill of her [facility]. Pt has an appointment on 12/12 for pap. Please contact pt

## 2018-02-27 ENCOUNTER — Telehealth: Payer: Self-pay

## 2018-02-27 MED ORDER — AZELASTINE HCL 0.1 % NA SOLN: 2.0000 | Freq: Two times a day (BID) | NASAL | 5 refills | Status: DC

## 2018-02-27 MED ORDER — OLOPATADINE HCL 0.7 % OP SOLN: 1.0000 [drp] | Freq: Every day | OPHTHALMIC | 5 refills | Status: DC

## 2018-02-27 NOTE — Telephone Encounter (Signed)
Received fax that patients insurance does not cover Azelastine 0.15% or Bepreve 1.5%. Insurance covers Astelin, Flonase, Patanase and Atrovent or Pataday and [employer]. Per Dr. Ernst Bowler sent in [place] 0.1% and Pazeo.

## 2018-03-26 ENCOUNTER — Ambulatory Visit: Payer: Medicaid Other | Admitting: Allergy & Immunology

## 2018-04-01 ENCOUNTER — Telehealth: Payer: Self-pay | Admitting: Orthopedic Surgery

## 2018-04-01 NOTE — Telephone Encounter (Signed)
Patient called, mentioned out of work due to knee problem (note indicates was to be out through 02/24/18). Has questions.  Called back to patient, reached voice mail, left message.

## 2018-04-07 NOTE — Telephone Encounter (Signed)
RETURN TO WORK: CHOICE

## 2018-04-07 NOTE — Telephone Encounter (Signed)
Called back to patient, no answer or voice message. Will keep trying patient in order to get note issued accordingly.

## 2018-04-07 NOTE — Telephone Encounter (Signed)
Patient returned call today, 04/08/18; states has stayed out of work from 02/03/18; states "never got a note" to return to work at her last appointment 02/17/18; last note indicates out till 02/24/18.  Patient is now asking when should she return to work?  States stands 8 hours a day, works inside of [employer] for another company, not employed by [employer]. Please advise. [phone]

## 2018-04-08 ENCOUNTER — Encounter: Payer: Self-pay | Admitting: Orthopedic Surgery

## 2018-04-08 NOTE — Telephone Encounter (Signed)
Patient returned call; note issued. Aware ready for pick up.

## 2018-04-10 ENCOUNTER — Other Ambulatory Visit: Payer: Self-pay | Admitting: Adult Health

## 2018-04-10 ENCOUNTER — Ambulatory Visit (INDEPENDENT_AMBULATORY_CARE_PROVIDER_SITE_OTHER): Payer: Medicaid Other | Admitting: Adult Health

## 2018-04-10 ENCOUNTER — Encounter: Payer: Self-pay | Admitting: Adult Health

## 2018-04-10 ENCOUNTER — Other Ambulatory Visit (HOSPITAL_COMMUNITY)
Admission: RE | Admit: 2018-04-10 | Discharge: 2018-04-10 | Disposition: A | Discharge status: Home / Self Care | Payer: Medicaid Other | Source: Ambulatory Visit | Attending: Adult Health | Admitting: Adult Health

## 2018-04-10 VITALS — BP 137/94 | HR 100 | Ht 65.0 in | Wt 268.5 lb

## 2018-04-10 DIAGNOSIS — Z309 Encounter for contraceptive management, unspecified: Secondary | ICD-10-CM

## 2018-04-10 DIAGNOSIS — Z113 Encounter for screening for infections with a predominantly sexual mode of transmission: Secondary | ICD-10-CM

## 2018-04-10 DIAGNOSIS — Z3009 Encounter for other general counseling and advice on contraception: Secondary | ICD-10-CM | POA: Diagnosis not present

## 2018-04-10 DIAGNOSIS — Z7689 Persons encountering health services in other specified circumstances: Secondary | ICD-10-CM | POA: Insufficient documentation

## 2018-04-10 DIAGNOSIS — Z01419 Encounter for gynecological examination (general) (routine) without abnormal findings: Secondary | ICD-10-CM

## 2018-04-10 MED ORDER — NORETHIN-ETH ESTRADIOL-FE 0.4-35 MG-MCG PO CHEW: CHEWABLE_TABLET | ORAL | 12 refills | Status: DC

## 2018-04-10 NOTE — Progress Notes (Signed)
Patient ID: [name], female   DOB: January 07, 1981, 37 y.o.   MRN: [id number] History of Present Illness: [name] is a 37 year old black female, G1P1 in for well woman gyn exam and pap, she has family planning medicaid. PCP is [name].    Current Medications, Allergies, Past Medical History, Past Surgical History, Family History and Social History were reviewed in [name] record.     Review of Systems: Patient denies any headaches, hearing loss, fatigue, blurred vision, shortness of breath, chest pain, abdominal pain, problems with bowel movements, urination, or intercourse. No joint pain or mood swings. Periods are coming at different time in pill pack, and is getting married 08/23/18 and does not want to be on her period, has they can be heavy     Physical Exam:BP (!) 137/94 (BP Location: Left Arm, Patient Position: Sitting, Cuff Size: Large)   Pulse 100   Ht 5\' 5"  (1.651 m)   Wt 268 lb 8 oz (121.8 kg)   LMP 03/27/2018   BMI 44.68 kg/m  General:  Well developed, well nourished, no acute distress Skin:  Warm and dry Neck:  Midline trachea, normal thyroid, good ROM, no lymphadenopathy Lungs; Clear to auscultation bilaterally Breast:  No dominant palpable mass, retraction, or nipple discharge,breasts are large, has tattoo of granny top left chest  Cardiovascular: Regular rate and rhythm Abdomen:  Soft, non tender, no hepatosplenomegaly Pelvic:  External genitalia is normal in appearance, no lesions.  The vagina is normal in appearance. Urethra has no lesions or masses. The cervix is bulbous. Pap with HPV and GC/CHL performed.  Uterus is felt to be normal size, shape, and contour.  No adnexal masses or tenderness noted.Bladder is non tender, no masses felt. Extremities/musculoskeletal:  No swelling or varicosities noted, no clubbing or cyanosis Psych:  No mood changes, alert and cooperative,seems happy PHQ 2 score 0. Fall risk is low. Examination  chaperoned by Mary Pupa LPN.  Impression: 1. Encounter for physical examination, contraception, and Papanicolaou smear of cervix   2. Family planning   3. Screening examination for STD (sexually transmitted disease)       Plan: Check HIV and RPR Pap with GC/CHL and HPV sent  Meds ordered this encounter  Medications  . Norethin-Eth Estradiol-Fe [facility] FE,WYMZYA FE,ZENCHENT FE,ZEOSA) 0.4-35 MG-MCG tablet    Sig: CHEW ONE TABLET BY MOUTH ONCE DAILY,take active pills continuously    Dispense:  28 tablet    Refill:  12    Order Specific Question:   Supervising Provider    Answer:   [name] [[id number]]  F/U in 3 months on OC change Physical in 1 year Pap in 3 if normal

## 2018-04-11 LAB — RPR: RPR: NONREACTIVE

## 2018-04-11 LAB — HIV ANTIBODY (ROUTINE TESTING W REFLEX): HIV Screen 4th Generation wRfx: NONREACTIVE

## 2018-04-14 LAB — CYTOLOGY - PAP
Chlamydia: NEGATIVE
DIAGNOSIS: NEGATIVE
HPV: NOT DETECTED
NEISSERIA GONORRHEA: NEGATIVE

## 2018-04-24 ENCOUNTER — Telehealth: Payer: Self-pay | Admitting: Adult Health

## 2018-04-24 NOTE — Telephone Encounter (Signed)
Pt called stating that she called the pharmacy to get her birth control pills filled and the pharmacy told her that they did not have a prescription. Advised pt that Rx was sent on 04/10/18.  Advised that I would call pharmacy to verify receipt of the rx and she could check back with them later today. Pt verbalized understanding.    [facility] called and stated to me that they did receive the Rx and will get it ready for pt to pick up today.

## 2018-04-24 NOTE — Telephone Encounter (Signed)
Patient called, stated she saw Anderson Malta on 04/10/18 and it was her understanding that Anderson Malta was going to call in a different type of bc than what she has been using.  Stated when she got to the pharmacy all they had was the same thing she was already on.  [facility]  [phone]

## 2018-04-24 NOTE — Telephone Encounter (Signed)
Left message @ 12:16 pm. JSY

## 2018-07-10 ENCOUNTER — Ambulatory Visit: Payer: Self-pay | Admitting: Adult Health

## 2018-07-21 ENCOUNTER — Telehealth: Payer: Self-pay | Admitting: *Deleted

## 2018-07-21 NOTE — Telephone Encounter (Signed)
LMOVM regarding visitor restrictions.

## 2018-07-22 ENCOUNTER — Ambulatory Visit: Payer: Self-pay | Admitting: Adult Health

## 2018-09-15 ENCOUNTER — Encounter: Payer: Self-pay | Admitting: Physician Assistant

## 2018-09-15 ENCOUNTER — Telehealth: Payer: Self-pay | Admitting: Physician Assistant

## 2018-09-15 ENCOUNTER — Telehealth: Payer: Self-pay | Admitting: Adult Health

## 2018-09-15 DIAGNOSIS — N939 Abnormal uterine and vaginal bleeding, unspecified: Secondary | ICD-10-CM

## 2018-09-15 DIAGNOSIS — R109 Unspecified abdominal pain: Secondary | ICD-10-CM

## 2018-09-15 NOTE — Telephone Encounter (Signed)
Pt requesting an appt. States she started spotting after ovulation and that now she is having a full period which is not normal for her.

## 2018-09-15 NOTE — Progress Notes (Signed)
Based on what you shared with me, I feel your condition warrants further evaluation and I recommend that you be seen for a face to face office visit.  [name],  As per our phone conversation, you stated that you LMP was 08/31/2018 and that you now have vaginal bleeding and abdominal cramping. You states your cycles as always regular and that you have been off your birth control for the past 3 months. It is recommended that you have a face to face evaluation of your symptoms.     NOTE: If you entered your credit card information for this eVisit, you will not be charged. You may see a "hold" on your card for the $35 but that hold will drop off and you will not have a charge processed.  If you are having a true medical emergency please call 911.  If you need an urgent face to face visit, [place] has four urgent care centers for your convenience.    PLEASE NOTE: THE [facility] LOCATIONS AND URGENT CARE CLINICS DO NOT HAVE THE TESTING FOR CORONAVIRUS COVID19 AVAILABLE.  IF YOU FEEL YOU NEED THIS TEST YOU MUST GO TO A TRIAGE LOCATION AT [place]   [email or web address] to reserve your spot online an avoid wait times  [facility] [address], [address] [place], [place] [address] Modified hours of operation: Monday-Friday, 12 PM to 6 PM  Saturday & Sunday 10 AM to 4 PM *Across the street from [place] (New Address!) [address] [place], [place] [address] *Just off [address], across the road from [place] hours of operation: Monday-Friday, 12 PM to 6 PM  Closed Saturday & Sunday  [facility]'s modified hours of operation will be in effect from May 1 until May 31   The following sites will take your insurance:  . [facility] [place] a Provider at this Location  [address] [place], [place] [address] . 10  am to 8 pm Monday-Friday . 12 pm to 8 pm Saturday-Sunday   . [facility] at [place] a Provider at this Location  [address], [place], [place] [address] . 8 am to 8 pm Monday-Friday . 9 am to 6 pm Saturday . 11 am to 6 pm Sunday   . [facility] at [place] Get Driving Directions  [address] [place], [place] [address] . 8 am to 8 pm Monday-Friday . 8 am to 4 pm Saturday-Sunday   Your e-visit answers were reviewed by a board certified advanced clinical practitioner to complete your personal care plan.  Thank you for using e-Visits.  I have spent 7 min in completion and review of this note- [name] [facility]

## 2018-09-16 ENCOUNTER — Telehealth: Payer: Self-pay | Admitting: Adult Health

## 2018-09-16 NOTE — Telephone Encounter (Signed)
Patient called stating that she called yesterday for a nurse to call her back, pt states that she got a call this morning and missed the call. Pt was returning that phone. I let patient know nurse is with patient and we will give her a call back. Please contact pt

## 2018-09-16 NOTE — Telephone Encounter (Signed)
Pt scheduled for visit with Anderson Malta Thursday at 130 for heavy bleeding.

## 2018-09-16 NOTE — Telephone Encounter (Signed)
Called patient back and left message that I am returning her call.  

## 2018-09-17 ENCOUNTER — Encounter: Payer: Self-pay | Admitting: Obstetrics and Gynecology

## 2018-09-17 ENCOUNTER — Ambulatory Visit (INDEPENDENT_AMBULATORY_CARE_PROVIDER_SITE_OTHER): Payer: Self-pay | Admitting: Obstetrics and Gynecology

## 2018-09-17 ENCOUNTER — Other Ambulatory Visit: Payer: Self-pay

## 2018-09-17 VITALS — BP 166/100 | HR 83 | Temp 98.5°F | Ht 65.0 in | Wt 293.0 lb

## 2018-09-17 DIAGNOSIS — I10 Essential (primary) hypertension: Secondary | ICD-10-CM

## 2018-09-17 DIAGNOSIS — N920 Excessive and frequent menstruation with regular cycle: Secondary | ICD-10-CM

## 2018-09-17 DIAGNOSIS — E669 Obesity, unspecified: Secondary | ICD-10-CM

## 2018-09-17 DIAGNOSIS — Z3202 Encounter for pregnancy test, result negative: Secondary | ICD-10-CM

## 2018-09-17 LAB — POCT URINE PREGNANCY: Preg Test, Ur: NEGATIVE

## 2018-09-17 MED ORDER — AMLODIPINE BESYLATE 10 MG PO TABS: 10.0000 mg | ORAL_TABLET | Freq: Every day | ORAL | 3 refills | Status: DC

## 2018-09-17 NOTE — Progress Notes (Signed)
Patient ID: [name], female   DOB: 21-Apr-1981, 38 y.o.   MRN: [id number]    [facility] Visit  @DATE @            Patient name: [name] MRN [id number]  Date of birth: 03-17-1981  CC & HPI:  [name] is a 38 y.o. female presenting today for heavy vaginal bleeding. She is trying to get pregnant but this past Sunday bleeding just started flowing early, ahs minimized since.    Is still taking hydrodiuril and is currently out of norvasc, has had headache for past 2 days. Normally takes 10 mg. Periods are normally very regular around the first of the month.  ROS:  ROS +headache +single episode vaginal bleeding   Pertinent History Reviewed:   Reviewed: Medical         Past Medical History:  Diagnosis Date  . Allergy    banana/pineapple  . Anemia    post pregnancy  . Anxiety   . Asthma   . Autoimmune disease, not elsewhere classified(279.49) 2013   Non specific- [facility], abnormal Bone Marrow signal  . Breast lump in female 11/28/2012   Has tender round mass at 10-11 0'clock right breast 4 finger breaths from areola will [place]  . Breast mass, right 08/18/2012  . Chlamydia   . Chronic breast pain 05/31/2015  . Dysmenorrhea 03/23/2014  . Dyspareunia 01/05/2014  . Fibroids, intramural 08/24/2015  . GERD (gastroesophageal reflux disease)   . Gonorrhea   . Headache(784.0)   . History of abnormal cervical Pap smear 04/02/2014  . Hypertension   . LLQ abdominal tenderness 05/31/2015  . Menorrhagia 01/05/2014  . Neuromuscular disorder ([place])    lower back and bilat leg pain  . Obesity   . Other and unspecified ovarian cyst 01/12/2014  . Polyclonal gammopathy 07/26/2013   Insignificant  . Recurrent upper respiratory infection (URI)   . Scoliosis   . Urticaria   . Vaginal irritation 09/30/2014  . Vaginal Pap smear, abnormal                               Surgical Hx:    Past Surgical History:  Procedure Laterality Date  . ADENOIDECTOMY     . DILATION AND CURETTAGE OF UTERUS    . TONSILLECTOMY     Medications: Reviewed & Updated - see associated section                       Current Outpatient Medications:  .  albuterol (PROVENTIL HFA;VENTOLIN HFA) 108 (90 Base) MCG/ACT inhaler, Inhale 2 puffs into the lungs every 4 (four) hours as needed., Disp: 6.7 g, Rfl: 2 .  azelastine (ASTELIN) 0.1 % nasal spray, Place 2 sprays into both nostrils 2 (two) times daily., Disp: 30 mL, Rfl: 5 .  Bepotastine Besilate (BEPREVE) 1.5 % SOLN, Place 1 drop into both eyes 2 (two) times daily as needed., Disp: 10 mL, Rfl: 1 .  fluticasone (FLONASE) 50 MCG/ACT nasal spray, Place 2 sprays into both nostrils as needed., Disp: 16 g, Rfl: 6 .  fluticasone furoate-vilanterol (BREO ELLIPTA) 100-25 MCG/INH AEPB, Inhale 1 puff into the lungs daily., Disp: 30 each, Rfl: 2 .  hydrochlorothiazide (HYDRODIURIL) 25 MG tablet, Take 1 tablet (25 mg total) by mouth daily., Disp: 30 tablet, Rfl: 6 .  ibuprofen (ADVIL,MOTRIN) 600 MG tablet, Take 1 tablet (600 mg total) by mouth every  6 (six) hours as needed., Disp: 30 tablet, Rfl: 1 .  ipratropium (ATROVENT) 0.03 % nasal spray, Place 2 sprays into both nostrils every 12 (twelve) hours., Disp: 30 mL, Rfl: 12 .  levocetirizine (XYZAL) 5 MG tablet, Take 1 tablet (5 mg total) by mouth every evening., Disp: 30 tablet, Rfl: 6 .  montelukast (SINGULAIR) 10 MG tablet, Take 1 tablet (10 mg total) by mouth at bedtime., Disp: 30 tablet, Rfl: 6 .  naproxen (NAPROSYN) 500 MG tablet, Take 1 tablet (500 mg total) by mouth 2 (two) times daily with a meal. (Patient taking differently: Take 500 mg by mouth as needed. ), Disp: 60 tablet, Rfl: 2 .  potassium chloride (K-DUR) 10 MEQ tablet, Take 1 tablet (10 mEq total) by mouth daily., Disp: 30 tablet, Rfl: 5 .  zolpidem (AMBIEN) 10 MG tablet, TAKE (1) TABLET BY MOUTH AT BEDTIME. (Patient taking differently: as needed. ), Disp: 15 tablet, Rfl: 2 .  amLODipine (NORVASC) 5 MG tablet, Take 1-2  tablets (5-10 mg total) by mouth daily. (Patient taking differently: Take 10 mg by mouth daily. ), Disp: 60 tablet, Rfl: 0   Social History: Reviewed -  reports that she has never smoked. She has never used smokeless tobacco.  Objective Findings:  Vitals: Blood pressure (!) 166/100, pulse 83, temperature 98.5 F (36.9 C), height 5\' 5"  (1.651 m), weight 293 lb (132.9 kg), last menstrual period 08/31/2018.  PHYSICAL EXAMINATION General appearance - alert, well appearing, and in no distress and overweight Mental status - alert, oriented to person, place, and time, normal mood, behavior, speech, dress, motor activity, and thought processes, affect appropriate to mood  PELVIC Vagina - light blood Cervix - not examined Uterus - not examined   Assessment & Plan:   A:  1. Menorrhagia 2. UNcontrolled HTN 3. Obesity 4. Medication management 5. Start prenatals  P:  1.  Refill Norvasc 10 mg    By signing my name below, I, Samul Dada, attest that this documentation has been prepared under the direction and in the presence of Estill Dooms, NP. Electronically Signed: Samul Dada Medical Scribe. 09/17/18. 4:12 PM.  I personally performed the services described in this documentation, which was SCRIBED in my presence. The recorded information has been reviewed and considered accurate. It has been edited as necessary during review. Jonnie Kind, MD

## 2018-09-18 ENCOUNTER — Encounter: Payer: Self-pay | Admitting: Family Medicine

## 2018-09-18 ENCOUNTER — Ambulatory Visit: Payer: Self-pay | Admitting: Adult Health

## 2018-09-18 ENCOUNTER — Telehealth: Payer: Self-pay | Admitting: Obstetrics and Gynecology

## 2018-09-18 MED ORDER — HYDROCHLOROTHIAZIDE 25 MG PO TABS: 25.0000 mg | ORAL_TABLET | Freq: Every day | ORAL | 0 refills | Status: DC

## 2018-09-18 NOTE — Telephone Encounter (Signed)
Patient stated that she was seen yesterday and Dr. Glo Herring told her to start taking hydrochlorothiazide and the pharmacy needs a prescription.  [facility]  [phone]

## 2018-09-18 NOTE — Telephone Encounter (Signed)
[facility] filled HCTZ today. Pt aware can pick up. [place]

## 2018-10-22 ENCOUNTER — Telehealth: Payer: Self-pay | Admitting: Adult Health

## 2018-10-22 NOTE — Telephone Encounter (Signed)
Pt is late on period. Not on any birth control. Having swelling in feet and ankles, some nausea, headaches. Breasts tender at first but not now. All pregnancy tests are negative. Call transferred to front desk for appt. [place]

## 2018-10-22 NOTE — Telephone Encounter (Signed)
Patient called, stated that she's 10 days late and she normally is like clockwork.  All her pregnancy test are negative.  She'd like an appointment, please advise.  [phone]

## 2018-10-23 ENCOUNTER — Ambulatory Visit (INDEPENDENT_AMBULATORY_CARE_PROVIDER_SITE_OTHER): Payer: Self-pay | Admitting: Adult Health

## 2018-10-23 ENCOUNTER — Other Ambulatory Visit: Payer: Self-pay

## 2018-10-23 ENCOUNTER — Encounter: Payer: Self-pay | Admitting: Adult Health

## 2018-10-23 VITALS — BP 147/99 | HR 102 | Ht 65.0 in | Wt 293.0 lb

## 2018-10-23 DIAGNOSIS — N926 Irregular menstruation, unspecified: Secondary | ICD-10-CM

## 2018-10-23 DIAGNOSIS — I10 Essential (primary) hypertension: Secondary | ICD-10-CM

## 2018-10-23 DIAGNOSIS — Z3202 Encounter for pregnancy test, result negative: Secondary | ICD-10-CM

## 2018-10-23 DIAGNOSIS — Z319 Encounter for procreative management, unspecified: Secondary | ICD-10-CM

## 2018-10-23 LAB — POCT URINE PREGNANCY: Preg Test, Ur: NEGATIVE

## 2018-10-23 NOTE — Progress Notes (Signed)
Patient ID: [name], female   DOB: 10-Mar-1981, 38 y.o.   MRN: [id number] History of Present Illness: [name] is a 38 year old black female, recently married, G1P1, in for having missed a period, has watery bloody discharge today when wiped. She would like to be pregnant. PCP is Dr Buelah Manis.    Current Medications, Allergies, Past Medical History, Past Surgical History, Family History and Social History were reviewed in [place] record.     Review of Systems: Missed period,but bloody discharge today when wiped  Feels tired Has had swelling in feet and ankles     Physical Exam:BP (!) 147/99 (BP Location: Right Arm, Patient Position: Sitting, Cuff Size: Normal)   Pulse (!) 102   Ht 5\' 5"  (1.651 m)   Wt 293 lb (132.9 kg)   LMP 10/23/2018   BMI 48.76 kg/m   UPT negative General:  Well developed, well nourished, no acute distress Skin:  Warm and dry Pelvic:  External genitalia is normal in appearance, no lesions.  The vagina is normal in appearance.+period like blood. Urethra has no lesions or masses. The cervix is bulbous.  Uterus is felt to be normal size, shape, and contour.  No adnexal masses or tenderness noted.Bladder is non tender, no masses felt. Psych:  No mood changes, alert and cooperative,seems happy Fall risk is low. PHQ 2 score 0. Examination chaperoned by Mary Rieger RN.   Impression: 1. Missed period   2. Patient desires pregnancy   3. Hypertension, unspecified type       Plan: Take OTC PNV Check progesterone level July 15  Discussed timing of sex with ovulation, have sex every other day 7-24 of cycle, pee before sex and lay there 30 minutes after   Take BP meds Follow up prn

## 2018-10-24 ENCOUNTER — Other Ambulatory Visit: Payer: Self-pay | Admitting: Family Medicine

## 2018-10-24 MED ORDER — HYDROCHLOROTHIAZIDE 25 MG PO TABS: 25.0000 mg | ORAL_TABLET | Freq: Every day | ORAL | 3 refills | Status: DC

## 2018-10-28 ENCOUNTER — Ambulatory Visit: Payer: Medicaid Other | Admitting: Family Medicine

## 2018-11-12 ENCOUNTER — Ambulatory Visit
Admission: EM | Admit: 2018-11-12 | Discharge: 2018-11-12 | Disposition: A | Discharge status: Home / Self Care | Payer: Self-pay | Attending: Emergency Medicine | Admitting: Emergency Medicine

## 2018-11-12 ENCOUNTER — Other Ambulatory Visit: Payer: Self-pay

## 2018-11-12 DIAGNOSIS — M7581 Other shoulder lesions, right shoulder: Secondary | ICD-10-CM

## 2018-11-12 DIAGNOSIS — M25511 Pain in right shoulder: Secondary | ICD-10-CM

## 2018-11-12 MED ORDER — MELOXICAM 7.5 MG PO TABS: 7.5000 mg | ORAL_TABLET | Freq: Every day | ORAL | 0 refills | Status: DC

## 2018-11-12 NOTE — Discharge Instructions (Signed)
Continue conservative management of rest, ice, and gentle stretches Take mobic as needed for pain relief (may cause abdominal discomfort, ulcers, and GI bleeds avoid taking with other NSAIDs) Follow up with occupational health and/or orthopedist for further evaluation and management Return or go to the ER if you have any new or worsening symptoms (fever, chills, chest pain, shortness of breath, skin changes, numbness/ tingling, symptoms do not improve with medications, etc...)

## 2018-11-12 NOTE — ED Triage Notes (Signed)
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

## 2018-11-12 NOTE — ED Provider Notes (Signed)
[facility]   [id number] 11/12/18 Arrival Time: 0630  CC: Right shoulder pain  SUBJECTIVE: History from: patient. [name] is a 38 y.o. female complains of right shoulder pain that began today.  Symptoms began after lifting a patient at work. Works as Quarry manager for [employer]. Localizes the pain to the front of shoulder.  Describes the pain as intermittent and sharp in character.  9/10.  Has tried OTC medications without relief.  Symptoms are made worse with shoulder ROM.  Reports similar symptoms in the past while lifting the same patient about 1 month ago.  Denies fever, chills, erythema, ecchymosis, effusion, weakness, numbness and tingling.     ROS: As per HPI.  Past Medical History:  Diagnosis Date  . Allergy    banana/pineapple  . Anemia    post pregnancy  . Anxiety   . Asthma   . Autoimmune disease, not elsewhere classified(279.49) 2013   Non specific- [facility], abnormal Bone Marrow signal  . Breast lump in female 11/28/2012   Has tender round mass at 10-11 0'clock right breast 4 finger breaths from areola will [place]  . Breast mass, right 08/18/2012  . Chlamydia   . Chronic breast pain 05/31/2015  . Dysmenorrhea 03/23/2014  . Dyspareunia 01/05/2014  . Fibroids, intramural 08/24/2015  . GERD (gastroesophageal reflux disease)   . Gonorrhea   . Headache(784.0)   . History of abnormal cervical Pap smear 04/02/2014  . Hypertension   . LLQ abdominal tenderness 05/31/2015  . Menorrhagia 01/05/2014  . Neuromuscular disorder ([place])    lower back and bilat leg pain  . Obesity   . Other and unspecified ovarian cyst 01/12/2014  . Polyclonal gammopathy 07/26/2013   Insignificant  . Recurrent upper respiratory infection (URI)   . Scoliosis   . Urticaria   . Vaginal irritation 09/30/2014  . Vaginal Pap smear, abnormal    Past Surgical History:  Procedure Laterality Date  . ADENOIDECTOMY    . DILATION AND CURETTAGE OF UTERUS    . TONSILLECTOMY     Allergies   Allergen Reactions  . Banana Rash  . Pineapple Rash   No current facility-administered medications on file prior to encounter.    Current Outpatient Medications on File Prior to Encounter  Medication Sig Dispense Refill  . albuterol (PROVENTIL HFA;VENTOLIN HFA) 108 (90 Base) MCG/ACT inhaler Inhale 2 puffs into the lungs every 4 (four) hours as needed. 6.7 g 2  . amLODipine (NORVASC) 10 MG tablet Take 1 tablet (10 mg total) by mouth daily. 90 tablet 3  . azelastine (ASTELIN) 0.1 % nasal spray Place 2 sprays into both nostrils 2 (two) times daily. 30 mL 5  . Bepotastine Besilate (BEPREVE) 1.5 % SOLN Place 1 drop into both eyes 2 (two) times daily as needed. 10 mL 1  . fluticasone (FLONASE) 50 MCG/ACT nasal spray Place 2 sprays into both nostrils as needed. 16 g 6  . fluticasone furoate-vilanterol (BREO ELLIPTA) 100-25 MCG/INH AEPB Inhale 1 puff into the lungs daily. 30 each 2  . hydrochlorothiazide (HYDRODIURIL) 25 MG tablet Take 1 tablet (25 mg total) by mouth daily. 90 tablet 3  . ibuprofen (ADVIL,MOTRIN) 600 MG tablet Take 1 tablet (600 mg total) by mouth every 6 (six) hours as needed. 30 tablet 1  . ipratropium (ATROVENT) 0.03 % nasal spray Place 2 sprays into both nostrils every 12 (twelve) hours. 30 mL 12  . montelukast (SINGULAIR) 10 MG tablet Take 1 tablet (10 mg total) by mouth at  bedtime. 30 tablet 6  . potassium chloride (K-DUR) 10 MEQ tablet Take 1 tablet (10 mEq total) by mouth daily. 30 tablet 5  . [DISCONTINUED] levocetirizine (XYZAL) 5 MG tablet Take 1 tablet (5 mg total) by mouth every evening. 30 tablet 6  . [DISCONTINUED] zolpidem (AMBIEN) 10 MG tablet TAKE (1) TABLET BY MOUTH AT BEDTIME. (Patient not taking: No sig reported) 15 tablet 2   Social History   Socioeconomic History  . Marital status: Married    Spouse name: Not on file  . Number of children: Not on file  . Years of education: Not on file  . Highest education level: Not on file  Occupational History  . Not  on file  Social Needs  . Financial resource strain: Not on file  . Food insecurity    Worry: Not on file    Inability: Not on file  . Transportation needs    Medical: Not on file    Non-medical: Not on file  Tobacco Use  . Smoking status: Never Smoker  . Smokeless tobacco: Never Used  Substance and Sexual Activity  . Alcohol use: No  . Drug use: No  . Sexual activity: Yes    Birth control/protection: None  Lifestyle  . Physical activity    Days per week: Not on file    Minutes per session: Not on file  . Stress: Not on file  Relationships  . Social Herbalist on phone: Not on file    Gets together: Not on file    Attends religious service: Not on file    Active member of club or organization: Not on file    Attends meetings of clubs or organizations: Not on file    Relationship status: Not on file  . Intimate partner violence    Fear of current or ex partner: Not on file    Emotionally abused: Not on file    Physically abused: Not on file    Forced sexual activity: Not on file  Other Topics Concern  . Not on file  Social History Narrative  . Not on file   Family History  Problem Relation Age of Onset  . Hypertension Mother   . Diabetes Mother   . Heart disease Mother   . Diabetes Father   . Cancer Paternal Aunt        breast  . Hypertension Maternal Grandmother   . Diabetes Son        pre diabetic  . Other Son        overactive bladder    OBJECTIVE:  Vitals:   11/12/18 1646  BP: (!) 123/94  Pulse: (!) 103  Resp: 20  Temp: 98.2 F (36.8 C)  SpO2: 97%    General appearance: ALERT; in no acute distress.  Head: NCAT Lungs: Normal respiratory effort Musculoskeletal: RT shoulder Inspection: Skin warm, dry, clear and intact without obvious erythema, effusion, or ecchymosis.  Palpation: diffusely TTP over anterior shoulder ROM: 0-60 passive ROM; 0-90 active ROM Strength: 4+/5 shld abduction, 5/5 shld adduction, 5/5 elbow flexion, 5/5 elbow  extension, 5/5 grip strength Skin: warm and dry Neurologic: Ambulates without difficulty; Sensation intact about the upper extremities Psychological: alert and cooperative; normal mood and affect  ASSESSMENT & PLAN:  1. Acute pain of right shoulder   2. Tendinitis of right rotator cuff     Meds ordered this encounter  Medications  . meloxicam (MOBIC) 7.5 MG tablet    Sig: Take 1 tablet (  7.5 mg total) by mouth daily.    Dispense:  20 tablet    Refill:  0    Order Specific Question:   Supervising Provider    Answer:   Raylene Everts [[id number]]   Injury not concerning for fracture Continue conservative management of rest, ice, and gentle stretches Take mobic as needed for pain relief (may cause abdominal discomfort, ulcers, and GI bleeds avoid taking with other NSAIDs) Follow up with occupational health and/or orthopedist for further evaluation and management Return or go to the ER if you have any new or worsening symptoms (fever, chills, chest pain, shortness of breath, skin changes, numbness/ tingling, symptoms do not improve with medications, etc...)   Reviewed expectations re: course of current medical issues. Questions answered. Outlined signs and symptoms indicating need for more acute intervention. Patient verbalized understanding. After Visit Summary given.    Lestine Box, PA-C 11/12/18 1709

## 2018-11-14 ENCOUNTER — Ambulatory Visit (INDEPENDENT_AMBULATORY_CARE_PROVIDER_SITE_OTHER): Payer: Self-pay | Admitting: Family Medicine

## 2018-11-14 ENCOUNTER — Other Ambulatory Visit: Payer: Self-pay

## 2018-11-14 ENCOUNTER — Encounter: Payer: Self-pay | Admitting: Family Medicine

## 2018-11-14 VITALS — BP 128/70 | HR 112 | Temp 98.2°F | Resp 18 | Ht 65.0 in | Wt 293.0 lb

## 2018-11-14 DIAGNOSIS — S46001A Unspecified injury of muscle(s) and tendon(s) of the rotator cuff of right shoulder, initial encounter: Secondary | ICD-10-CM

## 2018-11-14 DIAGNOSIS — R7302 Impaired glucose tolerance (oral): Secondary | ICD-10-CM

## 2018-11-14 DIAGNOSIS — J4540 Moderate persistent asthma, uncomplicated: Secondary | ICD-10-CM

## 2018-11-14 DIAGNOSIS — I10 Essential (primary) hypertension: Secondary | ICD-10-CM

## 2018-11-14 DIAGNOSIS — M25511 Pain in right shoulder: Secondary | ICD-10-CM

## 2018-11-14 DIAGNOSIS — G8929 Other chronic pain: Secondary | ICD-10-CM

## 2018-11-14 MED ORDER — MONTELUKAST SODIUM 10 MG PO TABS: 10.0000 mg | ORAL_TABLET | Freq: Every day | ORAL | 6 refills | Status: DC

## 2018-11-14 MED ORDER — AMLODIPINE BESYLATE 10 MG PO TABS: 10.0000 mg | ORAL_TABLET | Freq: Every day | ORAL | 3 refills | Status: DC

## 2018-11-14 MED ORDER — POTASSIUM CHLORIDE ER 10 MEQ PO TBCR: 10.0000 meq | EXTENDED_RELEASE_TABLET | Freq: Every day | ORAL | 5 refills | Status: DC

## 2018-11-14 MED ORDER — METHYLPREDNISOLONE 4 MG PO TBPK: ORAL_TABLET | ORAL | 0 refills | Status: DC

## 2018-11-14 MED ORDER — FLUTICASONE FUROATE-VILANTEROL 100-25 MCG/INH IN AEPB: 1.0000 | INHALATION_SPRAY | Freq: Every day | RESPIRATORY_TRACT | 2 refills | Status: DC

## 2018-11-14 MED ORDER — HYDROCHLOROTHIAZIDE 25 MG PO TABS: 25.0000 mg | ORAL_TABLET | Freq: Every day | ORAL | 3 refills | Status: DC

## 2018-11-14 MED ORDER — HYDROCODONE-ACETAMINOPHEN 5-325 MG PO TABS: 1.0000 | ORAL_TABLET | Freq: Four times a day (QID) | ORAL | 0 refills | Status: DC | PRN

## 2018-11-14 MED ORDER — ALBUTEROL SULFATE HFA 108 (90 BASE) MCG/ACT IN AERS: 2.0000 | INHALATION_SPRAY | RESPIRATORY_TRACT | 2 refills | Status: DC | PRN

## 2018-11-14 NOTE — Assessment & Plan Note (Signed)
bp looks okay today I will check her fasting labs.  She will continue amlodipine and hydrochlorothiazide.

## 2018-11-14 NOTE — Patient Instructions (Addendum)
F/U 3 months for Physical  Resart Breo Take steroids for inflammation norco in evening for pain  Referral to orthopedics

## 2018-11-14 NOTE — Assessment & Plan Note (Signed)
Restart Breo.  Continue albuterol as needed she will also restart her Singulair for the allergies and asthma at bedtime.

## 2018-11-14 NOTE — Progress Notes (Signed)
Subjective:    Patient ID: [name], female    DOB: [birth year], 38 y.o.   MRN: [id number]  Patient presents for Shoulder Pain (R shoulder, started x2 months, injured by lifting client, tylenol was taken) Patient here with right shoulder pain for the past few months.  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  Occasionally the client can help with some of the movement but at the time she is dead weight.  She is not had any other injuries to the shoulder besides working with her client.  She noticed popping initially back in February but now she is a point where she cannot raise her arm to shoulder height without significant pain she feels a pulling sensation in the anterior shoulder and even a numbness down the deltoid.  There is no tingling or numbness in the fingertips.  She is also had some intermittent right side wall pain.  No change in bowel or bladder.  No neck pain.  She has taken Tylenol use heating pad.  Hyperretension she has been taking her amlodipine and HCTZ  she does state that her blood pressure has been up and down sometimes up to 190 but lately it is been 1 2130s over 70s 80s.  She has not lost any significant amount of weight.  States that she tries to watch what she eats and is drinking more water.  She does get intermittent hand and feet swelling states that it was worse for a while but now is back done  Asthma she had to use her albuterol for an exacerbation about a week ago.  She did not seek any care at that time was able to manage it at home.  Is not using her Breo      Review Of Systems:  GEN- denies fatigue, fever, weight loss,weakness, recent illness HEENT- denies eye drainage, change in vision, nasal discharge, CVS- denies chest pain, palpitations RESP- denies SOB, cough, wheeze ABD- denies N/V, change in stools, abd pain GU- denies dysuria, hematuria, dribbling, incontinence MSK- + joint  pain, muscle aches, injury Neuro- denies headache, dizziness, syncope, seizure activity       Objective:    BP 128/70   Pulse (!) 112   Temp 98.2 F (36.8 C)   Resp 18   Ht 5\' 5"  (1.651 m)   Wt 293 lb (132.9 kg)   LMP 10/23/2018   SpO2 99%   BMI 48.76 kg/m  GEN- NAD, alert and oriented x3 HEENT- PERRL, EOMI, non injected sclera, pink conjunctiva, MMM, oropharynx clear Neck- Supple, no thyromegaly CVS- RRR, HR 90  no murmur RESP-CTAB ABD-NABS,soft,NT,ND MSK- TTP Right lower ribs/side wall, decreased ROM rIGTH SHOulder/ +empy can, TTP over deltoid and ant shoulder, biceps in tact, FROM Left shoulder  EXT- No edema Pulses- Radial, DP- 2+        Assessment & Plan:      Problem List Items Addressed This Visit      Unprioritized   Asthma in adult    Restart Breo.  Continue albuterol as needed she will also restart her Singulair for the allergies and asthma at bedtime.      Relevant Medications   albuterol (VENTOLIN HFA) 108 (90 Base) MCG/ACT inhaler   fluticasone furoate-vilanterol (BREO ELLIPTA) 100-25 MCG/INH AEPB   montelukast (SINGULAIR) 10 MG tablet   methylPREDNISolone (MEDROL DOSEPAK) 4 MG TBPK tablet   Glucose intolerance (impaired glucose  tolerance)   Relevant Orders   Hemoglobin A1c   Hypertension    bp looks okay today I will check her fasting labs.  She will continue amlodipine and hydrochlorothiazide.      Relevant Medications   amLODipine (NORVASC) 10 MG tablet   hydrochlorothiazide (HYDRODIURIL) 25 MG tablet   Other Relevant Orders   CBC with Differential/Platelet   Comprehensive metabolic panel   Lipid panel   Morbid obesity (HCC)    Diet especially eating lots of fast food carb sodium and how this contributes with her blood pressure and fluid retention.      Relevant Orders   Hemoglobin A1c   Lipid panel    Other Visit Diagnoses    Chronic right shoulder pain    -  Primary   Relevant Medications   HYDROcodone-acetaminophen (NORCO)  5-325 MG tablet   methylPREDNISolone (MEDROL DOSEPAK) 4 MG TBPK tablet   Other Relevant Orders   Ambulatory referral to Orthopedic Surgery   Injury of right rotator cuff, initial encounter       Referral to orthopedics concern for rotator cuff injury this likely occurred with lifting of her client.  I did put her on lifting restrictions and gave her a letter for her job.  She should not lift more than 10 pounds until we get the shoulder looked at.  I have given her Medrol Dosepak if she does not tolerate NSAIDs with her GI upset and GERD.  Also given her 20 tablets of hydrocodone to take in the evening when she is not at work. Regarding the sidewall pain I think this is musculoskeletal no particular injury it is intermittent abdominal exam was benign.   Relevant Orders   Ambulatory referral to Orthopedic Surgery      Note: This dictation was prepared with Dragon dictation along with smaller phrase technology. Any transcriptional errors that result from this process are unintentional.

## 2018-11-14 NOTE — Assessment & Plan Note (Signed)
Diet especially eating lots of fast food carb sodium and how this contributes with her blood pressure and fluid retention.

## 2018-11-15 LAB — CBC WITH DIFFERENTIAL/PLATELET
Absolute Monocytes: 505 cells/uL (ref 200–950)
Basophils Absolute: 93 cells/uL (ref 0–200)
Basophils Relative: 0.9 %
Eosinophils Absolute: 628 cells/uL — ABNORMAL HIGH (ref 15–500)
Eosinophils Relative: 6.1 %
HCT: 40.6 % (ref 35.0–45.0)
Hemoglobin: 13.3 g/dL (ref 11.7–15.5)
Lymphs Abs: 2379 cells/uL (ref 850–3900)
MCH: 29.7 pg (ref 27.0–33.0)
MCHC: 32.8 g/dL (ref 32.0–36.0)
MCV: 90.6 fL (ref 80.0–100.0)
MPV: 10.7 fL (ref 7.5–12.5)
Monocytes Relative: 4.9 %
Neutro Abs: 6695 cells/uL (ref 1500–7800)
Neutrophils Relative %: 65 %
Platelets: 391 10*3/uL (ref 140–400)
RBC: 4.48 10*6/uL (ref 3.80–5.10)
RDW: 12.5 % (ref 11.0–15.0)
Total Lymphocyte: 23.1 %
WBC: 10.3 10*3/uL (ref 3.8–10.8)

## 2018-11-15 LAB — HEMOGLOBIN A1C
Hgb A1c MFr Bld: 5.3 % of total Hgb (ref <5.7)
Mean Plasma Glucose: 105 (calc)
eAG (mmol/L): 5.8 (calc)

## 2018-11-15 LAB — COMPREHENSIVE METABOLIC PANEL
AG Ratio: 1.2 (calc) (ref 1.0–2.5)
ALT: 7 U/L (ref 6–29)
AST: 10 U/L (ref 10–30)
Albumin: 3.7 g/dL (ref 3.6–5.1)
Alkaline phosphatase (APISO): 77 U/L (ref 31–125)
BUN: 14 mg/dL (ref 7–25)
CO2: 25 mmol/L (ref 20–32)
Calcium: 8.9 mg/dL (ref 8.6–10.2)
Chloride: 103 mmol/L (ref 98–110)
Creat: 0.77 mg/dL (ref 0.50–1.10)
Globulin: 3.1 g/dL (calc) (ref 1.9–3.7)
Glucose, Bld: 107 mg/dL — ABNORMAL HIGH (ref 65–99)
Potassium: 3.8 mmol/L (ref 3.5–5.3)
Sodium: 138 mmol/L (ref 135–146)
Total Bilirubin: 0.5 mg/dL (ref 0.2–1.2)
Total Protein: 6.8 g/dL (ref 6.1–8.1)

## 2018-11-15 LAB — LIPID PANEL
Cholesterol: 136 mg/dL (ref <200)
HDL: 62 mg/dL (ref >=50)
LDL Cholesterol (Calc): 59 mg/dL (calc)
Non-HDL Cholesterol (Calc): 74 mg/dL (calc) (ref <130)
Total CHOL/HDL Ratio: 2.2 (calc) (ref <5.0)
Triglycerides: 71 mg/dL (ref <150)

## 2018-11-18 ENCOUNTER — Telehealth: Payer: Self-pay | Admitting: Orthopedic Surgery

## 2018-11-18 NOTE — Telephone Encounter (Signed)
Spoke with patient regarding referral received from Dr Buelah Manis for right shoulder problem; patient relays she was also treated at [facility] Emergency room, and that it is a work-related shoulder problem. Relays that her employer is aware, and that she was advised that someone from worker's comp would be reaching out to her. Voiced understanding that approval by worker's comp insurer is needed prior to scheduling appointment. Notes have been entered as well in referral workqueue.

## 2018-11-20 ENCOUNTER — Encounter: Payer: Self-pay | Admitting: *Deleted

## 2019-02-13 ENCOUNTER — Ambulatory Visit: Payer: Self-pay | Admitting: Family Medicine

## 2019-02-18 ENCOUNTER — Other Ambulatory Visit: Payer: Self-pay

## 2019-02-18 ENCOUNTER — Encounter: Payer: Self-pay | Admitting: Allergy & Immunology

## 2019-02-18 ENCOUNTER — Ambulatory Visit (INDEPENDENT_AMBULATORY_CARE_PROVIDER_SITE_OTHER): Payer: BC Managed Care – PPO | Admitting: Allergy & Immunology

## 2019-02-18 VITALS — BP 136/84 | HR 100 | Temp 98.4°F | Resp 20 | Ht 66.5 in | Wt 296.8 lb

## 2019-02-18 DIAGNOSIS — J3089 Other allergic rhinitis: Secondary | ICD-10-CM

## 2019-02-18 DIAGNOSIS — T781XXD Other adverse food reactions, not elsewhere classified, subsequent encounter: Secondary | ICD-10-CM | POA: Diagnosis not present

## 2019-02-18 DIAGNOSIS — J4540 Moderate persistent asthma, uncomplicated: Secondary | ICD-10-CM

## 2019-02-18 DIAGNOSIS — J302 Other seasonal allergic rhinitis: Secondary | ICD-10-CM

## 2019-02-18 MED ORDER — FLUTICASONE FUROATE-VILANTEROL 100-25 MCG/INH IN AEPB: 1.0000 | INHALATION_SPRAY | Freq: Every day | RESPIRATORY_TRACT | 5 refills | Status: DC

## 2019-02-18 MED ORDER — EPINEPHRINE 0.3 MG/0.3ML IJ SOAJ: 0.3000 mg | INTRAMUSCULAR | 2 refills | Status: DC | PRN

## 2019-02-18 MED ORDER — PAZEO 0.7 % OP SOLN: 1.0000 [drp] | OPHTHALMIC | 5 refills | Status: DC

## 2019-02-18 MED ORDER — ALBUTEROL SULFATE HFA 108 (90 BASE) MCG/ACT IN AERS: 2.0000 | INHALATION_SPRAY | RESPIRATORY_TRACT | 5 refills | Status: DC | PRN

## 2019-02-18 MED ORDER — MONTELUKAST SODIUM 10 MG PO TABS: 10.0000 mg | ORAL_TABLET | Freq: Every day | ORAL | 5 refills | Status: DC

## 2019-02-18 MED ORDER — XHANCE 93 MCG/ACT NA EXHU: 1.0000 | INHALANT_SUSPENSION | Freq: Two times a day (BID) | NASAL | 5 refills | Status: DC

## 2019-02-18 NOTE — Patient Instructions (Addendum)
1. Moderate persistent asthma, uncomplicated - Lung testing looked great today. - Daily controller medication(s): Singulair 10mg  daily and Breo 200/14mcg one puff once daily - Prior to physical activity: Proventil 2 puffs 10-15 minutes before physical activity. - Rescue medications: Proventil 4 puffs every 4-6 hours as needed - Asthma control goals:  * Full participation in all desired activities (may need albuterol before activity) * Albuterol use two time or less a week on average (not counting use with activity) * Cough interfering with sleep two time or less a month * Oral steroids no more than once a year * No hospitalizations  2. Chronic rhinitis (trees, indoor molds, outdoor molds and cat) - Stop taking: Flonase and Bepreve - Start the prednisone pack provided.  - Continue with: Singulair (montelukast) 10mg  daily - Start taking: Xhance (fluticasone) 1-2 sprays per nostril twice daily and Pazeo (olopatadine) one drop per eye daily as needed - We will send in the script to the [facility], and they will call you to confirm your shipping address. - You can review how to use the device here: https://www.xhance.com - Ask to be enrolled in the auto-refill program so you can get a year for free. - You can use an extra dose of the antihistamine, if needed, for breakthrough symptoms.  - Consider nasal saline rinses 1-2 times daily to remove allergens from the nasal cavities as well as help with mucous clearance (this is especially helpful to do before the nasal sprays are given) - Consider allergy shots as a means of long-term control.  3. Adverse food reaction (banana and pineapple) - Continue to avoid these foods. [name] training provided.  - Anaphylaxis management plan provided.   4. Return in about 3 months (around 05/21/2019).   Please inform us of any Emergency Department visits, hospitalizations, or changes in symptoms. Call us before going to the ED for breathing  or allergy symptoms since we might be able to fit you in for a sick visit. Feel free to contact us anytime with any questions, problems, or concerns.  It was a pleasure to meet you today!  Websites that have reliable patient information: 1. American Academy of Asthma, Allergy, and Immunology: www.aaaai.org 2. Food Allergy Research and Education (FARE): foodallergy.org 3. Mothers of Asthmatics: http://www.asthmacommunitynetwork.org 4. American College of Allergy, Asthma, and Immunology: MonthlyElectricBill.co.uk   Make sure you are registered to vote! If you have moved or changed any of your contact information, you will need to get this updated before voting!

## 2019-02-18 NOTE — Progress Notes (Signed)
FOLLOW UP  Date of Service/Encounter:  02/18/19   Assessment:   Moderate persistent asthma, uncomplicated  Seasonal and perennial allergic rhinitis (trees, indoor molds, outdoor molds and cat) - not well controlled  Adverse food reaction (pineapple, banana) - giving epinephrine autoinjector out of an abundance of caution)   Plan/Recommendations:   1. Moderate persistent asthma, uncomplicated - Lung testing looked great today. - Daily controller medication(s): Singulair 10mg  daily and Breo 200/80mcg one puff once daily - Prior to physical activity: Proventil 2 puffs 10-15 minutes before physical activity. - Rescue medications: Proventil 4 puffs every 4-6 hours as needed - Asthma control goals:  * Full participation in all desired activities (may need albuterol before activity) * Albuterol use two time or less a week on average (not counting use with activity) * Cough interfering with sleep two time or less a month * Oral steroids no more than once a year * No hospitalizations  2. Chronic rhinitis (trees, indoor molds, outdoor molds and cat) - Stop taking: Flonase and Bepreve - Start the prednisone pack provided.  - Continue with: Singulair (montelukast) 10mg  daily - Start taking: Xhance (fluticasone) 1-2 sprays per nostril twice daily and Pazeo (olopatadine) one drop per eye daily as needed - We will send in the script to the [facility], and they will call you to confirm your shipping address. - You can review how to use the device here: https://www.xhance.com - Ask to be enrolled in the auto-refill program so you can get a year for free. - You can use an extra dose of the antihistamine, if needed, for breakthrough symptoms.  - Consider nasal saline rinses 1-2 times daily to remove allergens from the nasal cavities as well as help with mucous clearance (this is especially helpful to do before the nasal sprays are given) - Consider allergy shots as a means of  long-term control.  3. Adverse food reaction (banana and pineapple) - Continue to avoid these foods. [name] training provided.  - Anaphylaxis management plan provided.   4. Return in about 3 months (around 05/21/2019).   Subjective:   [name] is a 38 y.o. female presenting today for follow up of  Chief Complaint  Patient presents with  . Allergies  . Nasal Congestion  . Itchy Eyes    [name] has a history of the following: Patient Active Problem List   Diagnosis Date Noted  . Patient desires pregnancy 10/23/2018  . Missed period 10/23/2018  . Family planning 04/10/2018  . Encounter for physical examination, contraception, and Papanicolaou smear of cervix 04/10/2018  . Moderate persistent asthma, uncomplicated [id number]  . Seasonal and perennial allergic rhinitis 02/12/2018  . Dizzy 03/12/2017  . Screening examination for STD (sexually transmitted disease) 03/12/2017  . Abnormal uterine bleeding (AUB) 03/12/2017  . Cysts of both ovaries 03/12/2017  . Plantar fasciitis 12/16/2015  . Allergic rhinitis 09/14/2015  . Fibroids, intramural 08/24/2015  . LLQ abdominal tenderness 05/31/2015  . Chronic breast pain 05/31/2015  . Glucose intolerance (impaired glucose tolerance) 03/16/2015  . Knee sprain 03/16/2015  . Acute sinusitis 01/04/2015  . Vaginal irritation 09/30/2014  . History of abnormal cervical Pap smear 04/02/2014  . Dysmenorrhea 03/23/2014  . Other and unspecified ovarian cyst 01/12/2014  . Menorrhagia 01/05/2014  . Dyspareunia 01/05/2014  . Polyclonal gammopathy 07/26/2013  . Dysplasia of cervix, unspecified 03/31/2013  . DUB (dysfunctional uterine bleeding) 03/10/2013  . Breast lump in female 11/28/2012  . Heartburn 04/18/2012  . Insomnia  12/09/2011  . Asthma in adult 04/18/2011  . Hypertension 09/21/2010  . FATIGUE 12/13/2009  . HEADACHE 01/18/2009  . Morbid obesity ([place]) 11/03/2007  . Generalized anxiety disorder  11/03/2007  . Chronic low back pain 11/03/2007    History obtained from: chart review and patient.  [name] is a 38 y.o. female presenting for a follow up visit.   Since the last visit, her allergy symptoms have been rather out of control she reports sneezing quite a bit and nasal congestion. She tells me that her nose is congested and her eyes and nose are running.   Asthma/Respiratory Symptom History: Her asthma has been well controlled.  She remains on the Breo 1 puff once daily.  Her primary care provider Dr. Buelah Manis refills this.  She has needed her rescue inhaler number of times over the summer, but that has calmed down for the most part.  She has not required any prednisone.  ACT score is 18, indicating subpar asthma control.  Allergic Rhinitis Symptom History: She is on the fluticasone. She is not using the Astelin since it did not seem to work at all. She is using a lot of Afrin to keep things open too. This is the only thing that seems to be working well for her symptoms. Symptoms have been ongoing since I saw her last time. Winter is just as bad as the rest of the year. Switching environments seems to make it worse. She has been given prednisone in the past which eases the symptoms, but does not resolve them completely. She is taking Xyzal and Zyrtec daily. She remains on the montelukast daily as well. She does not take them every day since it is hard to remember. She does occasionally develop sinus congestion from this. She did have her tonsils removed around 8 years ago or so. She had her tonsils and adenoids removed.   Food Allergy Symptom History: She did try banana and pineapple and she broke out in hives again. She has had some episodes where she did not know that there was banana or pineapple in a dish and she has had some reactions. She does not have an EpiPen at all.   Otherwise, there have been no changes to her past medical history, surgical history, family history, or social  history.    Review of Systems  Constitutional: Negative.  Negative for chills, fever, malaise/fatigue and weight loss.  HENT: Positive for congestion, sinus pain and sore throat. Negative for ear discharge and ear pain.   Eyes: Negative for pain, discharge and redness.  Respiratory: Negative for cough, sputum production, shortness of breath and wheezing.   Cardiovascular: Negative.  Negative for chest pain and palpitations.  Gastrointestinal: Negative for abdominal pain, constipation, diarrhea, heartburn, nausea and vomiting.  Skin: Negative.  Negative for itching and rash.  Neurological: Negative for dizziness and headaches.  Endo/Heme/Allergies: Negative for environmental allergies. Does not bruise/bleed easily.       Objective:   Blood pressure 136/84, pulse 100, temperature 98.4 F (36.9 C), temperature source Temporal, resp. rate 20, height 5' 6.5" (1.689 m), weight 296 lb 12.8 oz (134.6 kg). Body mass index is 47.19 kg/m.   Physical Exam:  Physical Exam  Constitutional: She appears well-developed.  Very pleasant female.  HENT:  Head: Normocephalic and atraumatic.  Right Ear: Tympanic membrane, external ear and ear canal normal.  Left Ear: Tympanic membrane, external ear and ear canal normal.  Nose: Mucosal edema and rhinorrhea present. No nasal deformity or septal deviation.  No epistaxis. Right sinus exhibits no maxillary sinus tenderness and no frontal sinus tenderness. Left sinus exhibits no maxillary sinus tenderness and no frontal sinus tenderness.  Mouth/Throat: Uvula is midline and oropharynx is clear and moist. Mucous membranes are not pale and not dry.  Positive nasal crease.  Allergic shiners bilaterally.  Eyes: Pupils are equal, round, and reactive to light. Conjunctivae and EOM are normal. Right eye exhibits no chemosis and no discharge. Left eye exhibits no chemosis and no discharge. Right conjunctiva is not injected. Left conjunctiva is not injected.   Cardiovascular: Normal rate, regular rhythm and normal heart sounds.  Respiratory: Effort normal and breath sounds normal. No accessory muscle usage. No tachypnea. No respiratory distress. She has no wheezes. She has no rhonchi. She has no rales. She exhibits no tenderness.  Moving air well in all lung fields.  Lymphadenopathy:    She has no cervical adenopathy.  Neurological: She is alert.  Skin: No abrasion, no petechiae and no rash noted. Rash is not papular, not vesicular and not urticarial. No erythema. No pallor.  Psychiatric: She has a normal mood and affect.     Diagnostic studies:    Spirometry: results normal (FEV1: 2.67/101%, FVC: 3.11/92%, FEV1/FVC: 86%).    Spirometry consistent with normal pattern.   Allergy Studies: none        Salvatore Marvel, MD  Allergy and [place] [facility] [place]

## 2019-02-20 ENCOUNTER — Telehealth: Payer: Self-pay

## 2019-02-20 ENCOUNTER — Telehealth: Payer: Self-pay | Admitting: *Deleted

## 2019-02-20 ENCOUNTER — Other Ambulatory Visit: Payer: Self-pay

## 2019-02-20 ENCOUNTER — Ambulatory Visit (INDEPENDENT_AMBULATORY_CARE_PROVIDER_SITE_OTHER): Payer: BC Managed Care – PPO | Admitting: Family Medicine

## 2019-02-20 ENCOUNTER — Encounter: Payer: Self-pay | Admitting: Family Medicine

## 2019-02-20 VITALS — BP 128/74 | HR 96 | Temp 98.6°F | Resp 14 | Ht 66.5 in | Wt 297.0 lb

## 2019-02-20 DIAGNOSIS — Z0001 Encounter for general adult medical examination with abnormal findings: Secondary | ICD-10-CM

## 2019-02-20 DIAGNOSIS — Z0000 Encounter for general adult medical examination without abnormal findings: Secondary | ICD-10-CM

## 2019-02-20 DIAGNOSIS — I10 Essential (primary) hypertension: Secondary | ICD-10-CM | POA: Diagnosis not present

## 2019-02-20 MED ORDER — FUROSEMIDE 20 MG PO TABS: 20.0000 mg | ORAL_TABLET | Freq: Every day | ORAL | 3 refills | Status: DC

## 2019-02-20 MED ORDER — PHENTERMINE HCL 37.5 MG PO TABS: 37.5000 mg | ORAL_TABLET | Freq: Every day | ORAL | 1 refills | Status: DC

## 2019-02-20 NOTE — Telephone Encounter (Signed)
PA for Truett Perna has also been submitted via CoverMyMeds and is currently pending.

## 2019-02-20 NOTE — Patient Instructions (Signed)
F/U 6 weeks  Start phentermine 1/2 tablet for 1 week, then 1 full tablet

## 2019-02-20 NOTE — Progress Notes (Signed)
   Subjective:    Patient ID: [name], female    DOB: 1981/01/13, 38 y.o.   MRN: [id number]  Patient presents for Annual Exam (is fasting) and BLE Edema (x1 month- swelling to feet/ ankles/ calves)   Pt here for CPE, has GYN- PAP Smear UTD - [facility]   Dr. Tamera Punt- she will getting Right Rotator Cuff tear- awaiting approval , she has completed physical therapy ,this is workers comp from our previous visit      Seen by allergy/asthma- on new nasal spray- Xhance and eye drop   HTN- taking norvasc    Lipids normal in July    Obesity- she has been gaining weight, states she has been trying to eat better, but eats a lot of grapes, she does pick up restaurants   weight up 6lbs since July , she is interested in weight loss meds      She has had some swelling in feet/ankles , she has been taking HCTZ  But doesn't feel like she has been urinating as much with it, no SOB    Hypokalemia- on potassium daily     Declines flu shot   Review Of Systems:  GEN- denies fatigue, fever, weight loss,weakness, recent illness HEENT- denies eye drainage, change in vision, nasal discharge, CVS- denies chest pain, palpitations RESP- denies SOB, cough, wheeze ABD- denies N/V, change in stools, abd pain GU- denies dysuria, hematuria, dribbling, incontinence MSK- denies joint pain, muscle aches, injury Neuro- denies headache, dizziness, syncope, seizure activity       Objective:    BP 128/74   Pulse 96   Temp 98.6 F (37 C) (Oral)   Resp 14   Ht 5' 6.5" (1.689 m)   Wt 297 lb (134.7 kg)   LMP 02/18/2019 Comment: regular  SpO2 96%   BMI 47.22 kg/m  GEN- NAD, alert and oriented x3 HEENT- PERRL, EOMI, non injected sclera, pink conjunctiva, MMM, oropharynx clear Neck- Supple, no thyromegaly CVS- RRR, no murmur RESP-CTAB ABD-NABS,soft,NT,ND EXT- trace ankle edema Pulses- Radial, DP- 2+   Fall/auditC -neg PHQ 9 score 4     Assessment & Plan:      Problem List Items  Addressed This Visit      Unprioritized   Hypertension    Controlled no changes      Relevant Medications   furosemide (LASIX) 20 MG tablet   Other Relevant Orders   CBC with Differential/Platelet (Completed)   Comprehensive metabolic panel (Completed)   Morbid obesity ([place])    Morbidly obese with hypertension She would benefit from significant weight loss Discussed dietary changes, will also augment with phentermine  Discussed use of medication       Relevant Medications   phentermine (ADIPEX-P) 37.5 MG tablet    Other Visit Diagnoses    Routine general medical examination at a health care facility    -  Primary   CPE done, pt has GYN, declines flu shot, labs done, recent lipids normal    Relevant Orders   TSH (Completed)   CBC with Differential/Platelet (Completed)      Note: This dictation was prepared with Dragon dictation along with smaller phrase technology. Any transcriptional errors that result from this process are unintentional.

## 2019-02-20 NOTE — Telephone Encounter (Signed)
error 

## 2019-02-20 NOTE — Telephone Encounter (Signed)
Patient's insurance company called and stated that [name] is not covered and PA was denied. They did state that Flunisolide was preferred. Please advise.

## 2019-02-20 NOTE — Telephone Encounter (Signed)
PA for Pazeo was initiated through [email or web address] awaiting approval

## 2019-02-21 LAB — CBC WITH DIFFERENTIAL/PLATELET
Absolute Monocytes: 799 cells/uL (ref 200–950)
Basophils Absolute: 104 cells/uL (ref 0–200)
Basophils Relative: 0.7 %
Eosinophils Absolute: 237 cells/uL (ref 15–500)
Eosinophils Relative: 1.6 %
HCT: 40.6 % (ref 35.0–45.0)
Hemoglobin: 13.3 g/dL (ref 11.7–15.5)
Lymphs Abs: 4322 cells/uL — ABNORMAL HIGH (ref 850–3900)
MCH: 29.8 pg (ref 27.0–33.0)
MCHC: 32.8 g/dL (ref 32.0–36.0)
MCV: 91 fL (ref 80.0–100.0)
MPV: 10.3 fL (ref 7.5–12.5)
Monocytes Relative: 5.4 %
Neutro Abs: 9339 cells/uL — ABNORMAL HIGH (ref 1500–7800)
Neutrophils Relative %: 63.1 %
Platelets: 404 10*3/uL — ABNORMAL HIGH (ref 140–400)
RBC: 4.46 10*6/uL (ref 3.80–5.10)
RDW: 12.1 % (ref 11.0–15.0)
Total Lymphocyte: 29.2 %
WBC: 14.8 10*3/uL — ABNORMAL HIGH (ref 3.8–10.8)

## 2019-02-21 LAB — COMPREHENSIVE METABOLIC PANEL
AG Ratio: 1.2 (calc) (ref 1.0–2.5)
ALT: 6 U/L (ref 6–29)
AST: 9 U/L — ABNORMAL LOW (ref 10–30)
Albumin: 3.9 g/dL (ref 3.6–5.1)
Alkaline phosphatase (APISO): 84 U/L (ref 31–125)
BUN: 12 mg/dL (ref 7–25)
CO2: 28 mmol/L (ref 20–32)
Calcium: 9.4 mg/dL (ref 8.6–10.2)
Chloride: 100 mmol/L (ref 98–110)
Creat: 0.86 mg/dL (ref 0.50–1.10)
Globulin: 3.3 g/dL (calc) (ref 1.9–3.7)
Glucose, Bld: 93 mg/dL (ref 65–99)
Potassium: 3.6 mmol/L (ref 3.5–5.3)
Sodium: 140 mmol/L (ref 135–146)
Total Bilirubin: 0.5 mg/dL (ref 0.2–1.2)
Total Protein: 7.2 g/dL (ref 6.1–8.1)

## 2019-02-21 LAB — TSH: TSH: 2.31 mIU/L

## 2019-02-21 NOTE — Assessment & Plan Note (Signed)
Controlled no changes 

## 2019-02-21 NOTE — Assessment & Plan Note (Signed)
Morbidly obese with hypertension She would benefit from significant weight loss Discussed dietary changes, will also augment with phentermine  Discussed use of medication

## 2019-02-23 MED ORDER — XHANCE 93 MCG/ACT NA EXHU: 1.0000 | INHALANT_SUSPENSION | Freq: Two times a day (BID) | NASAL | 5 refills | Status: DC

## 2019-02-23 NOTE — Telephone Encounter (Signed)
I don't understand why [employer] is denying this. I will route to [facility] to determine. If it is still not approved, we can try flunisolide two sprays per nostril BID.  Salvatore Marvel, MD Allergy and [facility]

## 2019-02-23 NOTE — Addendum Note (Signed)
Addended by: Lucrezia Starch I on: 02/23/2019 09:26 AM   Modules accepted: Orders

## 2019-02-23 NOTE — Telephone Encounter (Signed)
Please advise and thank you. 

## 2019-02-23 NOTE — Telephone Encounter (Signed)
Prescription has been updated and sent to [facility]. I am also resubmitting the prior auth. I could not add much more information as Hollie Beach stated she has only tried and failed three other nose sprays that show documented. I will await further information from insurance/covermymeds.

## 2019-02-23 NOTE — Telephone Encounter (Signed)
Patient will need to try and fail flunisolide prior to getting Xhance.

## 2019-02-24 ENCOUNTER — Other Ambulatory Visit: Payer: Self-pay | Admitting: *Deleted

## 2019-02-24 ENCOUNTER — Telehealth: Payer: Self-pay | Admitting: *Deleted

## 2019-02-24 MED ORDER — EPINASTINE HCL 0.05 % OP SOLN: 1.0000 [drp] | Freq: Two times a day (BID) | OPHTHALMIC | 5 refills | Status: DC | PRN

## 2019-02-24 NOTE — Telephone Encounter (Signed)
Sorry I thought I responded. Please change to flunisolide two sprays per nostril BID.  Salvatore Marvel, MD Allergy and [place] [facility] [place]

## 2019-02-24 NOTE — Addendum Note (Signed)
Addended by: Lucrezia Starch I on: 02/24/2019 02:50 PM   Modules accepted: Orders

## 2019-02-24 NOTE — Telephone Encounter (Signed)
Call pt back Her Bp was normal in the office, if she started the phentermine then this may have caused the BP to go up and HA I would stop it for 1 week, then try again with only 1/2 tablet daily   If she has not started , then stop the lasix, drink fluids, restart her HCTZ which was previously controlling her BP

## 2019-02-24 NOTE — Telephone Encounter (Signed)
New medication has been sent to [facility].

## 2019-02-24 NOTE — Telephone Encounter (Signed)
Let's do epinastine one drop per eye BID PRN.  Salvatore Marvel, MD Allergy and [place] [facility] [place]

## 2019-02-24 NOTE — Telephone Encounter (Signed)
Patient's PA for Lillie Fragmin has been denied. Preferred alternatives are Epinastine and Azelastine. Please advise.

## 2019-02-24 NOTE — Telephone Encounter (Signed)
Received call from patient.   Reports that she has stopped HCTZ on 02/20/2019 as directed. States that  She began Lasix, but has noted near daily HA x4 days. States that pain is on top of head, but not in the sinus area. States that HA is relieved by rest and APAP.   BP remains slightly elevated. Noted at 160/90.  MD please advise.

## 2019-02-25 ENCOUNTER — Other Ambulatory Visit: Payer: Self-pay | Admitting: *Deleted

## 2019-02-25 DIAGNOSIS — D72829 Elevated white blood cell count, unspecified: Secondary | ICD-10-CM

## 2019-02-25 MED ORDER — HYDROCHLOROTHIAZIDE 25 MG PO TABS: 25.0000 mg | ORAL_TABLET | Freq: Every day | ORAL | 3 refills | Status: DC

## 2019-02-25 NOTE — Telephone Encounter (Signed)
Call placed to patient and patient made aware.   States that she has not begun phentermine at this time. Advised to hold medication until HA/BP is under control.    Advised to stop Lasix and resume HCTZ.   Patient also states that she would like to have labs re-checked for her WBC. Advised to come in 1 week to have labs drawn.

## 2019-03-10 HISTORY — PX: SHOULDER SURGERY: SHX246

## 2019-03-24 ENCOUNTER — Telehealth: Payer: Self-pay | Admitting: Nurse Practitioner

## 2019-03-24 DIAGNOSIS — N760 Acute vaginitis: Secondary | ICD-10-CM

## 2019-03-24 DIAGNOSIS — B9689 Other specified bacterial agents as the cause of diseases classified elsewhere: Secondary | ICD-10-CM

## 2019-03-24 MED ORDER — METRONIDAZOLE 500 MG PO TABS: 500.0000 mg | ORAL_TABLET | Freq: Two times a day (BID) | ORAL | 0 refills | Status: DC

## 2019-03-24 NOTE — Progress Notes (Signed)

## 2019-04-06 ENCOUNTER — Ambulatory Visit: Payer: BC Managed Care – PPO | Admitting: Family Medicine

## 2019-04-07 ENCOUNTER — Ambulatory Visit: Payer: BC Managed Care – PPO | Admitting: Family Medicine

## 2019-04-27 ENCOUNTER — Other Ambulatory Visit: Payer: Self-pay

## 2019-04-27 ENCOUNTER — Ambulatory Visit: Payer: BC Managed Care – PPO | Attending: Internal Medicine

## 2019-04-27 DIAGNOSIS — Z20822 Contact with and (suspected) exposure to covid-19: Secondary | ICD-10-CM

## 2019-04-27 DIAGNOSIS — Z20828 Contact with and (suspected) exposure to other viral communicable diseases: Secondary | ICD-10-CM | POA: Diagnosis not present

## 2019-04-29 LAB — NOVEL CORONAVIRUS, NAA: SARS-CoV-2, NAA: NOT DETECTED

## 2019-05-01 NOTE — L&D Delivery Note (Signed)
Delivery Note At 12:01 AM a viable female was delivered via Vaginal, Spontaneous (Presentation: Left Occiput Anterior).  APGAR: 9, 9; weight pending.   Placenta status: Spontaneous;Pathology given length of labor course, Intact.  Cord: 3 vessels with the following complications: None.  Cord pH: pending.  Anesthesia: Epidural Episiotomy: None Lacerations: None Suture Repair: n/a Est. Blood Loss (mL): 400  Mom to [facility] specialty.  Baby to Couplet care / Skin to Skin.  [name] [birth year], 12:43 AM

## 2019-05-07 ENCOUNTER — Telehealth: Payer: Self-pay | Admitting: *Deleted

## 2019-05-07 NOTE — Telephone Encounter (Signed)
yes

## 2019-05-07 NOTE — Telephone Encounter (Signed)
Pt left message that she has had 3 positive home pregnancy test. She wants to know if she can continue taking her HCTZ and Amlodipine.

## 2019-05-13 ENCOUNTER — Ambulatory Visit (INDEPENDENT_AMBULATORY_CARE_PROVIDER_SITE_OTHER): Payer: BC Managed Care – PPO | Admitting: *Deleted

## 2019-05-13 ENCOUNTER — Other Ambulatory Visit: Payer: Self-pay | Admitting: Obstetrics and Gynecology

## 2019-05-13 ENCOUNTER — Other Ambulatory Visit: Payer: Self-pay

## 2019-05-13 VITALS — BP 140/94 | HR 101

## 2019-05-13 DIAGNOSIS — Z3201 Encounter for pregnancy test, result positive: Secondary | ICD-10-CM | POA: Diagnosis not present

## 2019-05-13 DIAGNOSIS — N926 Irregular menstruation, unspecified: Secondary | ICD-10-CM

## 2019-05-13 LAB — POCT URINE PREGNANCY: Preg Test, Ur: POSITIVE — AB

## 2019-05-13 MED ORDER — BLOOD PRESSURE CUFF MISC: 1.0000 | 0 refills | Status: DC

## 2019-05-13 NOTE — Progress Notes (Signed)
Blood pressure cuff ordered

## 2019-05-13 NOTE — Progress Notes (Signed)
   NURSE VISIT- PREGNANCY CONFIRMATION   SUBJECTIVE:  [name] is a 39 y.o. G58P1001 female at [redacted]w[redacted]d by certain LMP of Patient's last menstrual period was 04/03/2019 (exact date). Here for pregnancy confirmation.  Home pregnancy test: positive x 5  She reports nausea.  She is not taking prenatal vitamins.    OBJECTIVE:  LMP 04/03/2019 (Exact Date)   Appears well, in no apparent distress OB History  Gravida Para Term Preterm AB Living  2 1 1    0 1  SAB TAB Ectopic Multiple Live Births  0       1    # Outcome Date GA Lbr Len/2nd Weight Sex Delivery Anes PTL Lv  2 Current           1 Term 11/19/02 [redacted]w[redacted]d  6 lb 14 oz (3.118 kg) M Vag-Spont   LIV    Results for orders placed or performed in visit on 05/13/19 (from the past 24 hour(s))  POCT urine pregnancy   Collection Time: 05/13/19  2:37 PM  Result Value Ref Range   Preg Test, Ur Positive (A) Negative    ASSESSMENT: Positive pregnancy test, Unknown by LMP    PLAN: Schedule for dating ultrasound in 2 weeks Prenatal vitamins: plans to begin OTC ASAP   Nausea medicines: not currently needed   OB packet given: Yes  [name], [name]  05/13/2019 2:43 PM

## 2019-05-22 ENCOUNTER — Other Ambulatory Visit: Payer: Self-pay

## 2019-05-22 ENCOUNTER — Ambulatory Visit (INDEPENDENT_AMBULATORY_CARE_PROVIDER_SITE_OTHER): Payer: BC Managed Care – PPO | Admitting: Allergy & Immunology

## 2019-05-22 ENCOUNTER — Encounter: Payer: Self-pay | Admitting: Allergy & Immunology

## 2019-05-22 VITALS — BP 102/78 | HR 94 | Temp 98.4°F | Resp 18 | Ht 65.0 in

## 2019-05-22 DIAGNOSIS — J45909 Unspecified asthma, uncomplicated: Secondary | ICD-10-CM | POA: Diagnosis not present

## 2019-05-22 DIAGNOSIS — T781XXD Other adverse food reactions, not elsewhere classified, subsequent encounter: Secondary | ICD-10-CM

## 2019-05-22 DIAGNOSIS — J4540 Moderate persistent asthma, uncomplicated: Secondary | ICD-10-CM

## 2019-05-22 DIAGNOSIS — J3089 Other allergic rhinitis: Secondary | ICD-10-CM

## 2019-05-22 DIAGNOSIS — J302 Other seasonal allergic rhinitis: Secondary | ICD-10-CM

## 2019-05-22 DIAGNOSIS — O99511 Diseases of the respiratory system complicating pregnancy, first trimester: Secondary | ICD-10-CM

## 2019-05-22 MED ORDER — FAMOTIDINE 20 MG PO TABS: 20.0000 mg | ORAL_TABLET | Freq: Two times a day (BID) | ORAL | 5 refills | Status: DC

## 2019-05-22 MED ORDER — FLUNISOLIDE 25 MCG/ACT (0.025%) NA SOLN: 2.0000 | Freq: Two times a day (BID) | NASAL | 5 refills | Status: DC

## 2019-05-22 NOTE — Patient Instructions (Addendum)
1. Moderate persistent asthma, uncomplicated - Lung testing looked great today. - Add on famotidine 20mg  twice daily to see if this helps with any GERD. - Give [place] an update in two weeks.  - Daily controller medication(s): Singulair 10mg  daily and Breo 200/47mcg one puff once daily - Prior to physical activity: Proventil 2 puffs 10-15 minutes before physical activity. - Rescue medications: Proventil 4 puffs every 4-6 hours as needed - Asthma control goals:  * Full participation in all desired activities (may need albuterol before activity) * Albuterol use two time or less a week on average (not counting use with activity) * Cough interfering with sleep two time or less a month * Oral steroids no more than once a year * No hospitalizations  2. Chronic rhinitis (trees, indoor molds, outdoor molds and cat) - Continue taking: flunisolide 1-2 sprays per nostril twice daly, Pazeo (olopatadine) one drop per eye daily as needed, and Singulair (montelukast) 10mg  daily - You can use an extra dose of the antihistamine, if needed, for breakthrough symptoms.  - Consider nasal saline rinses 1-2 times daily to remove allergens from the nasal cavities as well as help with mucous clearance (this is especially helpful to do before the nasal sprays are g iven) - Consider allergy shots as a means of long-term control (we are not going to start those while you are pregnant, though).  3. Adverse food reaction (banana and pineapple) - Continue to avoid these foods. [name] training refilled. - Anaphylaxis management plan provided.   4. Return in about 4 months (around 09/19/2019). This can be an in-person, a virtual Webex or a telephone follow up visit.   Please inform us of any Emergency Department visits, hospitalizations, or changes in symptoms. Call us before going to the ED for breathing or allergy symptoms since we might be able to fit you in for a sick visit. Feel free to contact us anytime with any  questions, problems, or concerns.  It was a pleasure to see you again today! Congrats on the pregnancy!!   Websites that have reliable patient information: 1. American Academy of Asthma, Allergy, and Immunology: www.aaaai.org 2. Food Allergy Research and Education (FARE): foodallergy.org 3. Mothers of Asthmatics: http://www.asthmacommunitynetwork.org 4. American College of Allergy, Asthma, and Immunology: www.acaai.org   COVID-19 Vaccine Information can be found at: ShippingScam.co.uk For questions related to vaccine distribution or appointments, please email vaccine@[email or web address] [email or web address] or call [phone].     "Like" [place] on Facebook and Instagram for our latest updates!        Make sure you are registered to vote! If you have moved or changed any of your contact information, you will need to get this updated before voting!  In some cases, you MAY be able to register to vote online: [email or web address]

## 2019-05-22 NOTE — Progress Notes (Signed)
FOLLOW UP  Date of Service/Encounter:  05/22/19   Assessment:   Moderate persistent asthma, uncomplicated  First trimester pregnancy - 7 weeks  Seasonal and perennial allergic rhinitis(trees, indoor molds, outdoor molds and cat) - not well controlled  Adverse food reaction(pineapple, banana) - has epinephrine autoinjector out of an abundance of caution)  ?  GERD - exacerbated by pregnancy   [name] presents for a follow-up visit.  Since last time I saw her, she has gotten pregnant.  This was not a planned pregnancy, but she is going to continue with that.  We did discuss the implications of her pregnancy on her asthma.  Her shortness of breath episodes occurred around 1 week ago and have been ongoing.  We did talk about the natural course of asthma during pregnancy.  In general, one third of patients improve, one third of patients today the same, and one third of patients get worse during pregnancy.  She seems to be in the latter group unfortunately.  We are going to continue with the current controller medication.  Although it is not class B during pregnancy, it makes more sense to keep her breathing under good control and avoid the need for systemic steroids then to change her to Pulmicort, which is the only approved medication for use during pregnancy.    Plan/Recommendations:   1. Moderate persistent asthma, uncomplicated - Lung testing looked great today. - Add on famotidine 20mg  twice daily to see if this helps with any GERD. - Give [place] an update in two weeks.  - Daily controller medication(s): Singulair 10mg  daily and Breo 200/12mcg one puff once daily - Prior to physical activity: Proventil 2 puffs 10-15 minutes before physical activity. - Rescue medications: Proventil 4 puffs every 4-6 hours as needed - Asthma control goals:  * Full participation in all desired activities (may need albuterol before activity) * Albuterol use two time or less a week on average (not  counting use with activity) * Cough interfering with sleep two time or less a month * Oral steroids no more than once a year * No hospitalizations  2. Chronic rhinitis (trees, indoor molds, outdoor molds and cat) - Continue taking: flunisolide 1-2 sprays per nostril twice daly, Pazeo (olopatadine) one drop per eye daily as needed, and Singulair (montelukast) 10mg  daily - You can use an extra dose of the antihistamine, if needed, for breakthrough symptoms.  - Consider nasal saline rinses 1-2 times daily to remove allergens from the nasal cavities as well as help with mucous clearance (this is especially helpful to do before the nasal sprays are g iven) - Consider allergy shots as a means of long-term control (we are not going to start those while you are pregnant, though).  3. Adverse food reaction (banana and pineapple) - Continue to avoid these foods. [name] training refilled. - Anaphylaxis management plan provided.   4. Return in about 4 months (around 09/19/2019). This can be an in-person, a virtual Webex or a telephone follow up visit.   Subjective:   [name] is a 39 y.o. female presenting today for follow up of  Chief Complaint  Patient presents with  . Asthma    [name] has a history of the following: Patient Active Problem List   Diagnosis Date Noted  . Moderate persistent asthma, uncomplicated [id number]  . Seasonal and perennial allergic rhinitis 02/12/2018  . Dizzy 03/12/2017  . Screening examination for STD (sexually transmitted disease) 03/12/2017  . Cysts of both  ovaries 03/12/2017  . Plantar fasciitis 12/16/2015  . Allergic rhinitis 09/14/2015  . Fibroids, intramural 08/24/2015  . Chronic breast pain 05/31/2015  . Glucose intolerance (impaired glucose tolerance) 03/16/2015  . Knee sprain 03/16/2015  . History of abnormal cervical Pap smear 04/02/2014  . Other and unspecified ovarian cyst 01/12/2014  . Polyclonal gammopathy 07/26/2013  .  Dysplasia of cervix, unspecified 03/31/2013  . Heartburn 04/18/2012  . Insomnia 12/09/2011  . Asthma in adult 04/18/2011  . Hypertension 09/21/2010  . Morbid obesity ([place]) 11/03/2007  . Chronic low back pain 11/03/2007    History obtained from: chart review and patient.  [name] is a 39 y.o. female presenting for a follow up visit.  She was last seen in October 2020.  At that time, her lung testing looked great.  We continued Singulair and Breo 200/25 mcg 1 puff once daily.  For her rhinitis, we continued with Singulair. We stopped the Flonase and the Bepreve. We attempted to prescribe Truett Perna, but this was denied by her insurance company. Instead, we started flunisolide 1-2 sprays per nostril twice daily. We recommended continued avoidance of pineapple and banana.   Since the last visit, she has mostly done well. She recently found out that she was pregnant (currently [redacted] weeks gestation). She is followed by Dr. Mallory Shirk at [facility].   Asthma/Respiratory Symptom History: She remains on the Breo one puff once daily. She is using her albuterol fairly routinely. Since finding out that she was pregnant, her SOB has increased and it is responsive to the albuterol. She has no history of pulmonary embolisms. Her main complaints are with activities of daily living.  Allergic Rhinitis Symptom History: Her nasal spray is working well for you. She is delivering it twice daily. She does not feel that there are allergy shots needed at this time. She has not needed antibiotics at all since the last visit.   Food Allergy Symptom History: Pineapple leads to hives and the banana leads to a sensation of swelling. She was eating bananas previously without a problem. Her EpiPen is up to date.   Otherwise, there have been no changes to her past medical history, surgical history, family history, or social history.    Review of Systems  Constitutional: Negative.  Negative for chills, fever,  malaise/fatigue and weight loss.  HENT: Negative.  Negative for congestion, ear discharge and ear pain.   Eyes: Negative for pain, discharge and redness.  Respiratory: Positive for shortness of breath. Negative for cough, sputum production and wheezing.   Cardiovascular: Negative.  Negative for chest pain and palpitations.  Gastrointestinal: Negative for abdominal pain, constipation, diarrhea, heartburn, nausea and vomiting.  Skin: Negative.  Negative for itching and rash.  Neurological: Negative for dizziness and headaches.  Endo/Heme/Allergies: Negative for environmental allergies. Does not bruise/bleed easily.       Objective:   Blood pressure 102/78, pulse 94, temperature 98.4 F (36.9 C), temperature source Temporal, resp. rate 18, height 5\' 5"  (1.651 m), last menstrual period 04/03/2019, SpO2 100 %. Body mass index is 49.42 kg/m.   Physical Exam:  Physical Exam  Constitutional: She appears well-developed.  Pleasant female. Cooperative with the exam. Pleasant.   HENT:  Head: Normocephalic and atraumatic.  Right Ear: Tympanic membrane, external ear and ear canal normal.  Left Ear: Tympanic membrane, external ear and ear canal normal.  Nose: Mucosal edema and rhinorrhea present. No nasal deformity or septal deviation. No epistaxis. Right sinus exhibits no maxillary sinus tenderness and no  frontal sinus tenderness. Left sinus exhibits no maxillary sinus tenderness and no frontal sinus tenderness.  Mouth/Throat: Uvula is midline and oropharynx is clear and moist. Mucous membranes are not pale and not dry.  Cobblestoning present in the posterior oropharynx.   Eyes: Pupils are equal, round, and reactive to light. Conjunctivae and EOM are normal. Right eye exhibits no chemosis and no discharge. Left eye exhibits no chemosis and no discharge. Right conjunctiva is not injected. Left conjunctiva is not injected.  Cardiovascular: Normal rate, regular rhythm and normal heart sounds.    Respiratory: Effort normal and breath sounds normal. No accessory muscle usage. No tachypnea. No respiratory distress. She has no wheezes. She has no rhonchi. She has no rales. She exhibits no tenderness.  Moving air well in all lung fields.   Lymphadenopathy:    She has no cervical adenopathy.  Neurological: She is alert.  Skin: No abrasion, no petechiae and no rash noted. Rash is not papular, not vesicular and not urticarial. No erythema. No pallor.  No eczematous or urticarial lesions noted.   Psychiatric: She has a normal mood and affect.     Diagnostic studies:    Spirometry: results normal (FEV1: 2.53/101%, FVC: 2.89/91%, FEV1/FVC: 87%).    Spirometry consistent with normal pattern.   Allergy Studies: none       Salvatore Marvel, MD  [facility] [place] [facility] [place]

## 2019-05-26 ENCOUNTER — Other Ambulatory Visit: Payer: Self-pay | Admitting: Obstetrics and Gynecology

## 2019-05-26 DIAGNOSIS — O3680X0 Pregnancy with inconclusive fetal viability, not applicable or unspecified: Secondary | ICD-10-CM

## 2019-05-27 ENCOUNTER — Other Ambulatory Visit: Payer: Self-pay

## 2019-05-27 ENCOUNTER — Other Ambulatory Visit: Payer: Self-pay | Admitting: Adult Health

## 2019-05-27 ENCOUNTER — Ambulatory Visit (INDEPENDENT_AMBULATORY_CARE_PROVIDER_SITE_OTHER): Payer: BC Managed Care – PPO

## 2019-05-27 DIAGNOSIS — Z3A01 Less than 8 weeks gestation of pregnancy: Secondary | ICD-10-CM

## 2019-05-27 DIAGNOSIS — O3680X0 Pregnancy with inconclusive fetal viability, not applicable or unspecified: Secondary | ICD-10-CM | POA: Diagnosis not present

## 2019-05-27 MED ORDER — PROMETHAZINE HCL 25 MG PO TABS: 25.0000 mg | ORAL_TABLET | Freq: Four times a day (QID) | ORAL | 1 refills | Status: DC | PRN

## 2019-05-27 NOTE — Progress Notes (Signed)
Will rx phenergan  

## 2019-05-27 NOTE — Progress Notes (Signed)
[place] 7+5 wks,single IUP with YS, positive fht 152 bpm,normal right ovary,simple left corpus luteal cyst 3 x 2.1 x 2.1 cm

## 2019-05-28 ENCOUNTER — Telehealth: Payer: Self-pay | Admitting: *Deleted

## 2019-05-28 ENCOUNTER — Other Ambulatory Visit: Payer: Self-pay | Admitting: *Deleted

## 2019-05-28 MED ORDER — FLUTICASONE PROPIONATE 50 MCG/ACT NA SUSP: 1.0000 | Freq: Two times a day (BID) | NASAL | 5 refills | Status: DC | PRN

## 2019-05-28 NOTE — Telephone Encounter (Signed)
Patient's insurance states that the patient is now on pregnancy medicaid and that Azelastine, Fluticasone, Ipratropium, and Olopatadine are preferred to Flunisolide. Please advise change in nasal spray.

## 2019-05-28 NOTE — Telephone Encounter (Signed)
New prescription has been sent in. Called patient and advised. Patient verbalized understanding.  

## 2019-05-28 NOTE — Telephone Encounter (Signed)
We can change to fluticasone one spray per nostril BID PRN instead.   Salvatore Marvel, MD [facility] [place] [facility] [place]

## 2019-06-08 ENCOUNTER — Telehealth: Payer: Self-pay | Admitting: *Deleted

## 2019-06-08 NOTE — Telephone Encounter (Signed)
Patient states she has now noticed brown discharge.  Denies cramping. Informed patient that more that likely this is old blood and not uncommon to notice after having some light pink bleeding.  Encouraged to push fluids and if pain or bleeding increased, to let us know.  Pt verbalized understanding and agreeable to plan.

## 2019-06-08 NOTE — Telephone Encounter (Signed)
Pt noticed blood on Saturday and Sunday. Pt is pregnant. Pt hadn't had sex or a BM. Has not noticed any today. I advised pt to drink plenty of fluids and no sex x 7 days from the last time she wipes any color. Advised to call with further questions. Pt voiced understanding. [place]

## 2019-06-08 NOTE — Telephone Encounter (Signed)
Pt reports that her bleeding has turned to brown everytime she wipes. Wants to know if this is ok.

## 2019-06-08 NOTE — Telephone Encounter (Signed)
Pt would like a nurse to call her. She noticed blood when wiping on Saturday and Sunday.

## 2019-06-10 ENCOUNTER — Telehealth: Payer: Self-pay | Admitting: *Deleted

## 2019-06-10 ENCOUNTER — Ambulatory Visit (INDEPENDENT_AMBULATORY_CARE_PROVIDER_SITE_OTHER): Payer: BC Managed Care – PPO

## 2019-06-10 ENCOUNTER — Other Ambulatory Visit (INDEPENDENT_AMBULATORY_CARE_PROVIDER_SITE_OTHER): Payer: BC Managed Care – PPO | Admitting: *Deleted

## 2019-06-10 ENCOUNTER — Other Ambulatory Visit: Payer: Self-pay

## 2019-06-10 ENCOUNTER — Other Ambulatory Visit: Payer: Self-pay | Admitting: Obstetrics and Gynecology

## 2019-06-10 DIAGNOSIS — Z3A09 9 weeks gestation of pregnancy: Secondary | ICD-10-CM

## 2019-06-10 DIAGNOSIS — O3680X0 Pregnancy with inconclusive fetal viability, not applicable or unspecified: Secondary | ICD-10-CM | POA: Diagnosis not present

## 2019-06-10 DIAGNOSIS — R350 Frequency of micturition: Secondary | ICD-10-CM

## 2019-06-10 DIAGNOSIS — O26891 Other specified pregnancy related conditions, first trimester: Secondary | ICD-10-CM

## 2019-06-10 DIAGNOSIS — Z331 Pregnant state, incidental: Secondary | ICD-10-CM

## 2019-06-10 DIAGNOSIS — Z1389 Encounter for screening for other disorder: Secondary | ICD-10-CM

## 2019-06-10 DIAGNOSIS — R319 Hematuria, unspecified: Secondary | ICD-10-CM

## 2019-06-10 LAB — POCT URINALYSIS DIPSTICK OB
Glucose, UA: NEGATIVE
Ketones, UA: NEGATIVE
Leukocytes, UA: NEGATIVE
Nitrite, UA: NEGATIVE

## 2019-06-10 NOTE — Progress Notes (Signed)
Chart reviewed for nurse visit. Agree with plan of care.  Estill Dooms, NP 06/10/2019 2:53 PM

## 2019-06-10 NOTE — Telephone Encounter (Signed)
Pt states that she has been spotting and this morning the bleeding is worse but she is unsure if it is in her urine or from pregnancy. Please advise.

## 2019-06-10 NOTE — Telephone Encounter (Signed)
Patient states she has noticed bright red bleeding in the toilet this morning and is concerned.  Denies recent intercourse, cramping, hard stools, etc.  Discussed with Danise Mina, NP and recommended u/s.  Pt made aware and scheduled for this afternoon.  Pt verbalized understanding and agreeable to plan.

## 2019-06-10 NOTE — Progress Notes (Signed)
   NURSE VISIT- UTI SYMPTOMS   SUBJECTIVE:  [name] is a 39 y.o. G26P1001 female here for UTI symptoms. She is [redacted]w[redacted]d pregnant. She reports hematuria, urinary frequency and urinary urgency.  OBJECTIVE:  LMP 04/03/2019 (Exact Date)   Appears well, in no apparent distress  Results for orders placed or performed in visit on 06/10/19 (from the past 24 hour(s))  POC Urinalysis Dipstick OB   Collection Time: 06/10/19  2:02 PM  Result Value Ref Range   Color, UA     Clarity, UA     Glucose, UA Negative Negative   Bilirubin, UA     Ketones, UA neg    Spec Grav, UA     Blood, UA trace    pH, UA     POC,PROTEIN,UA Trace Negative, Trace, Small (1+), Moderate (2+), Large (3+), 4+   Urobilinogen, UA     Nitrite, UA neg    Leukocytes, UA Negative Negative   Appearance     Odor      ASSESSMENT: Pregnancy [redacted]w[redacted]d with UTI symptoms and negative nitrites  PLAN: Discussed with Derrek Monaco, AGNP   Rx sent by provider today: No Urine culture sent Call or return to clinic prn if these symptoms worsen or fail to improve as anticipated. Follow-up: as scheduled   Alice Rieger  06/10/2019 2:02 PM

## 2019-06-10 NOTE — Progress Notes (Signed)
[place] 9+5 wks,single IUP,positive fht 169 bpm,normal ovaries,crl 27.96 mm

## 2019-06-12 LAB — URINE CULTURE

## 2019-06-30 ENCOUNTER — Other Ambulatory Visit: Payer: Self-pay | Admitting: Obstetrics & Gynecology

## 2019-06-30 DIAGNOSIS — Z3682 Encounter for antenatal screening for nuchal translucency: Secondary | ICD-10-CM

## 2019-07-01 ENCOUNTER — Ambulatory Visit (INDEPENDENT_AMBULATORY_CARE_PROVIDER_SITE_OTHER): Payer: BC Managed Care – PPO

## 2019-07-01 ENCOUNTER — Ambulatory Visit: Payer: BC Managed Care – PPO | Admitting: *Deleted

## 2019-07-01 ENCOUNTER — Ambulatory Visit (INDEPENDENT_AMBULATORY_CARE_PROVIDER_SITE_OTHER): Payer: BC Managed Care – PPO | Admitting: Advanced Practice Midwife

## 2019-07-01 ENCOUNTER — Encounter: Payer: Self-pay | Admitting: Advanced Practice Midwife

## 2019-07-01 ENCOUNTER — Other Ambulatory Visit: Payer: Self-pay

## 2019-07-01 VITALS — BP 144/91 | HR 88 | Wt 298.0 lb

## 2019-07-01 DIAGNOSIS — Z3682 Encounter for antenatal screening for nuchal translucency: Secondary | ICD-10-CM

## 2019-07-01 DIAGNOSIS — O10919 Unspecified pre-existing hypertension complicating pregnancy, unspecified trimester: Secondary | ICD-10-CM | POA: Diagnosis not present

## 2019-07-01 DIAGNOSIS — O09521 Supervision of elderly multigravida, first trimester: Secondary | ICD-10-CM

## 2019-07-01 DIAGNOSIS — O09291 Supervision of pregnancy with other poor reproductive or obstetric history, first trimester: Secondary | ICD-10-CM

## 2019-07-01 DIAGNOSIS — O161 Unspecified maternal hypertension, first trimester: Secondary | ICD-10-CM | POA: Diagnosis not present

## 2019-07-01 DIAGNOSIS — O09529 Supervision of elderly multigravida, unspecified trimester: Secondary | ICD-10-CM | POA: Insufficient documentation

## 2019-07-01 DIAGNOSIS — O0990 Supervision of high risk pregnancy, unspecified, unspecified trimester: Secondary | ICD-10-CM | POA: Insufficient documentation

## 2019-07-01 DIAGNOSIS — Z3A12 12 weeks gestation of pregnancy: Secondary | ICD-10-CM

## 2019-07-01 DIAGNOSIS — O0991 Supervision of high risk pregnancy, unspecified, first trimester: Secondary | ICD-10-CM

## 2019-07-01 DIAGNOSIS — Z6841 Body Mass Index (BMI) 40.0 and over, adult: Secondary | ICD-10-CM

## 2019-07-01 DIAGNOSIS — Z8759 Personal history of other complications of pregnancy, childbirth and the puerperium: Secondary | ICD-10-CM

## 2019-07-01 MED ORDER — TERCONAZOLE 0.4 % VA CREA: 1.0000 | TOPICAL_CREAM | Freq: Every day | VAGINAL | 0 refills | Status: DC

## 2019-07-01 MED ORDER — ASPIRIN 81 MG PO CHEW: 162.0000 mg | CHEWABLE_TABLET | Freq: Every day | ORAL | 10 refills | Status: DC

## 2019-07-01 NOTE — Patient Instructions (Signed)
Carrington Clamp Speyer, I greatly value your feedback.  If you receive a survey following your visit with [place] today, we appreciate you taking the time to fill it out.  Thanks, Derrill Memo, [place]!!! It is now [facility] ([place], [place] [address]) Entrance located off of [address] parking   Nausea & Vomiting  Have saltine crackers or pretzels by your bed and eat a few bites before you raise your head out of bed in the morning  Eat small frequent meals throughout the day instead of large meals  Drink plenty of fluids throughout the day to stay hydrated, just don't drink a lot of fluids with your meals.  This can make your stomach fill up faster making you feel sick  Do not brush your teeth right after you eat  Products with real ginger are good for nausea, like ginger ale and ginger hard candy Make sure it says made with real ginger!  Sucking on sour candy like lemon heads is also good for nausea  If your prenatal vitamins make you nauseated, take them at night so you will sleep through the nausea  Sea Bands  If you feel like you need medicine for the nausea & vomiting please let us know  If you are unable to keep any fluids or food down please let us know   Constipation  Drink plenty of fluid, preferably water, throughout the day  Eat foods high in fiber such as fruits, vegetables, and grains  Exercise, such as walking, is a good way to keep your bowels regular  Drink warm fluids, especially warm prune juice, or decaf coffee  Eat a 1/2 cup of real oatmeal (not instant), 1/2 cup applesauce, and 1/2-1 cup warm prune juice every day  If needed, you may take Colace (docusate sodium) stool softener once or twice a day to help keep the stool soft.   If you still are having problems with constipation, you may take Miralax once daily as needed to help keep your bowels regular.   Home Blood  Pressure Monitoring for Patients   Your provider has recommended that you check your blood pressure (BP) at least once a week at home. If you do not have a blood pressure cuff at home, one will be provided for you. Contact your provider if you have not received your monitor within 1 week.   Helpful Tips for Accurate Home Blood Pressure Checks  . Don't smoke, exercise, or drink caffeine 30 minutes before checking your BP . Use the restroom before checking your BP (a full bladder can raise your pressure) . Relax in a comfortable upright chair . Feet on the ground . Left arm resting comfortably on a flat surface at the level of your heart . Legs uncrossed . Back supported . Sit quietly and don't talk . Place the cuff on your bare arm . Adjust snuggly, so that only two fingertips can fit between your skin and the top of the cuff . Check 2 readings separated by at least one minute . Keep a log of your BP readings . For a visual, please reference this diagram: http://ccnc.care/bpdiagram  Provider Name: [facility]     Phone: [phone]  Zone 1: ALL CLEAR  Continue to monitor your symptoms:  . BP reading is less than 140 (top number) or less than 90 (bottom number)  . No right upper stomach pain .  No headaches or seeing spots . No feeling nauseated or throwing up . No swelling in face and hands  Zone 2: CAUTION Call your doctor's office for any of the following:  . BP reading is greater than 140 (top number) or greater than 90 (bottom number)  . Stomach pain under your ribs in the middle or right side . Headaches or seeing spots . Feeling nauseated or throwing up . Swelling in face and hands  Zone 3: EMERGENCY  Seek immediate medical care if you have any of the following:  . BP reading is greater than160 (top number) or greater than 110 (bottom number) . Severe headaches not improving with Tylenol . Serious difficulty catching your breath . Any worsening symptoms from Zone  2    First Trimester of Pregnancy The first trimester of pregnancy is from week 1 until the end of week 12 (months 1 through 3). A week after a sperm fertilizes an egg, the egg will implant on the wall of the uterus. This embryo will begin to develop into a baby. Genes from you and your partner are forming the baby. The female genes determine whether the baby is a boy or a girl. At 6-8 weeks, the eyes and face are formed, and the heartbeat can be seen on ultrasound. At the end of 12 weeks, all the baby's organs are formed.  Now that you are pregnant, you will want to do everything you can to have a healthy baby. Two of the most important things are to get good prenatal care and to follow your health care provider's instructions. Prenatal care is all the medical care you receive before the baby's birth. This care will help prevent, find, and treat any problems during the pregnancy and childbirth. BODY CHANGES Your body goes through many changes during pregnancy. The changes vary from woman to woman.   You may gain or lose a couple of pounds at first.  You may feel sick to your stomach (nauseous) and throw up (vomit). If the vomiting is uncontrollable, call your health care provider.  You may tire easily.  You may develop headaches that can be relieved by medicines approved by your health care provider.  You may urinate more often. Painful urination may mean you have a bladder infection.  You may develop heartburn as a result of your pregnancy.  You may develop constipation because certain hormones are causing the muscles that push waste through your intestines to slow down.  You may develop hemorrhoids or swollen, bulging veins (varicose veins).  Your breasts may begin to grow larger and become tender. Your nipples may stick out more, and the tissue that surrounds them (areola) may become darker.  Your gums may bleed and may be sensitive to brushing and flossing.  Dark spots or blotches  (chloasma, mask of pregnancy) may develop on your face. This will likely fade after the baby is born.  Your menstrual periods will stop.  You may have a loss of appetite.  You may develop cravings for certain kinds of food.  You may have changes in your emotions from day to day, such as being excited to be pregnant or being concerned that something may go wrong with the pregnancy and baby.  You may have more vivid and strange dreams.  You may have changes in your hair. These can include thickening of your hair, rapid growth, and changes in texture. Some women also have hair loss during or after pregnancy, or hair that feels dry  or thin. Your hair will most likely return to normal after your baby is born. WHAT TO EXPECT AT YOUR PRENATAL VISITS During a routine prenatal visit:  You will be weighed to make sure you and the baby are growing normally.  Your blood pressure will be taken.  Your abdomen will be measured to track your baby's growth.  The fetal heartbeat will be listened to starting around week 10 or 12 of your pregnancy.  Test results from any previous visits will be discussed. Your health care provider may ask you:  How you are feeling.  If you are feeling the baby move.  If you have had any abnormal symptoms, such as leaking fluid, bleeding, severe headaches, or abdominal cramping.  If you have any questions. Other tests that may be performed during your first trimester include:  Blood tests to find your blood type and to check for the presence of any previous infections. They will also be used to check for low iron levels (anemia) and Rh antibodies. Later in the pregnancy, blood tests for diabetes will be done along with other tests if problems develop.  Urine tests to check for infections, diabetes, or protein in the urine.  An ultrasound to confirm the proper growth and development of the baby.  An amniocentesis to check for possible genetic problems.  Fetal  screens for spina bifida and Down syndrome.  You may need other tests to make sure you and the baby are doing well. HOME CARE INSTRUCTIONS  Medicines  Follow your health care provider's instructions regarding medicine use. Specific medicines may be either safe or unsafe to take during pregnancy.  Take your prenatal vitamins as directed.  If you develop constipation, try taking a stool softener if your health care provider approves. Diet  Eat regular, well-balanced meals. Choose a variety of foods, such as meat or vegetable-based protein, fish, milk and low-fat dairy products, vegetables, fruits, and whole grain breads and cereals. Your health care provider will help you determine the amount of weight gain that is right for you.  Avoid raw meat and uncooked cheese. These carry germs that can cause birth defects in the baby.  Eating four or five small meals rather than three large meals a day may help relieve nausea and vomiting. If you start to feel nauseous, eating a few soda crackers can be helpful. Drinking liquids between meals instead of during meals also seems to help nausea and vomiting.  If you develop constipation, eat more high-fiber foods, such as fresh vegetables or fruit and whole grains. Drink enough fluids to keep your urine clear or pale yellow. Activity and Exercise  Exercise only as directed by your health care provider. Exercising will help you:  Control your weight.  Stay in shape.  Be prepared for labor and delivery.  Experiencing pain or cramping in the lower abdomen or low back is a good sign that you should stop exercising. Check with your health care provider before continuing normal exercises.  Try to avoid standing for long periods of time. Move your legs often if you must stand in one place for a long time.  Avoid heavy lifting.  Wear low-heeled shoes, and practice good posture.  You may continue to have sex unless your health care provider directs you  otherwise. Relief of Pain or Discomfort  Wear a good support bra for breast tenderness.    Take warm sitz baths to soothe any pain or discomfort caused by hemorrhoids. Use hemorrhoid cream if your  health care provider approves.    Rest with your legs elevated if you have leg cramps or low back pain.  If you develop varicose veins in your legs, wear support hose. Elevate your feet for 15 minutes, 3-4 times a day. Limit salt in your diet. Prenatal Care  Schedule your prenatal visits by the twelfth week of pregnancy. They are usually scheduled monthly at first, then more often in the last 2 months before delivery.  Write down your questions. Take them to your prenatal visits.  Keep all your prenatal visits as directed by your health care provider. Safety  Wear your seat belt at all times when driving.  Make a list of emergency phone numbers, including numbers for family, friends, the hospital, and police and fire departments. General Tips  Ask your health care provider for a referral to a local prenatal education class. Begin classes no later than at the beginning of month 6 of your pregnancy.  Ask for help if you have counseling or nutritional needs during pregnancy. Your health care provider can offer advice or refer you to specialists for help with various needs.  Do not use hot tubs, steam rooms, or saunas.  Do not douche or use tampons or scented sanitary pads.  Do not cross your legs for long periods of time.  Avoid cat litter boxes and soil used by cats. These carry germs that can cause birth defects in the baby and possibly loss of the fetus by miscarriage or stillbirth.  Avoid all smoking, herbs, alcohol, and medicines not prescribed by your health care provider. Chemicals in these affect the formation and growth of the baby.  Schedule a dentist appointment. At home, brush your teeth with a soft toothbrush and be gentle when you floss. SEEK MEDICAL CARE IF:   You have  dizziness.  You have mild pelvic cramps, pelvic pressure, or nagging pain in the abdominal area.  You have persistent nausea, vomiting, or diarrhea.  You have a bad smelling vaginal discharge.  You have pain with urination.  You notice increased swelling in your face, hands, legs, or ankles. SEEK IMMEDIATE MEDICAL CARE IF:   You have a fever.  You are leaking fluid from your vagina.  You have spotting or bleeding from your vagina.  You have severe abdominal cramping or pain.  You have rapid weight gain or loss.  You vomit blood or material that looks like coffee grounds.  You are exposed to [place] measles and have never had them.  You are exposed to fifth disease or chickenpox.  You develop a severe headache.  You have shortness of breath.  You have any kind of trauma, such as from a fall or a car accident. Document Released: 04/10/2001 Document Revised: 08/31/2013 Document Reviewed: 02/24/2013 [facility] Patient Information 2015 [place], [place]. This information is not intended to replace advice given to you by your health care provider. Make sure you discuss any questions you have with your health care provider.  Coronavirus (COVID-19) Are you at risk?  Are you at risk for the Coronavirus (COVID-19)?  To be considered HIGH RISK for Coronavirus (COVID-19), you have to meet the following criteria:  . Traveled to [place], [place], [place], [place] or [place]; or in the [place] to [place], [place], [place], or [place]; and have fever, cough, and shortness of breath within the last 2 weeks of travel OR . Been in close contact with a person diagnosed with COVID-19 within the last 2 weeks and  have fever, cough, and shortness of breath . IF YOU DO NOT MEET THESE CRITERIA, YOU ARE CONSIDERED LOW RISK FOR COVID-19.  What to do if you are HIGH RISK for COVID-19?  Marland Kitchen If you are having a medical emergency, call 911. . Seek medical care right away. Before you go to a  doctor's office, urgent care or emergency department, call ahead and tell them about your recent travel, contact with someone diagnosed with COVID-19, and your symptoms. You should receive instructions from your physician's office regarding next steps of care.  . When you arrive at healthcare provider, tell the healthcare staff immediately you have returned from visiting [place], [place], [place], [place] or [place]; or traveled in the [place] to [place], [place], [place], or [place]; in the last two weeks or you have been in close contact with a person diagnosed with COVID-19 in the last 2 weeks.   . Tell the health care staff about your symptoms: fever, cough and shortness of breath. . After you have been seen by a medical provider, you will be either: o Tested for (COVID-19) and discharged home on quarantine except to seek medical care if symptoms worsen, and asked to  - Stay home and avoid contact with others until you get your results (4-5 days)  - Avoid travel on public transportation if possible (such as bus, train, or airplane) or o Sent to the Emergency Department by EMS for evaluation, COVID-19 testing, and possible admission depending on your condition and test results.  What to do if you are LOW RISK for COVID-19?  Reduce your risk of any infection by using the same precautions used for avoiding the common cold or flu:  Marland Kitchen Wash your hands often with soap and warm water for at least 20 seconds.  If soap and water are not readily available, use an alcohol-based hand sanitizer with at least 60% alcohol.  . If coughing or sneezing, cover your mouth and nose by coughing or sneezing into the elbow areas of your shirt or coat, into a tissue or into your sleeve (not your hands). . Avoid shaking hands with others and consider head nods or verbal greetings only. . Avoid touching your eyes, nose, or mouth with unwashed hands.  . Avoid close contact with people who are sick. . Avoid  places or events with large numbers of people in one location, like concerts or sporting events. . Carefully consider travel plans you have or are making. . If you are planning any travel outside or inside the [place], visit the CDC's Travelers' Health webpage for the latest health notices. . If you have some symptoms but not all symptoms, continue to monitor at home and seek medical attention if your symptoms worsen. . If you are having a medical emergency, call 911.   [place] / e-Visit: [email or web address]         [facility]: [facility]: [id number]                   [facility]: [phone]

## 2019-07-01 NOTE — Progress Notes (Signed)
[place] 12+5 wks,measurements c/w dates,crl 63.09 mm,NB present,NT 1.4 mm,normal ovaries,anterior placenta gr 0

## 2019-07-01 NOTE — Progress Notes (Signed)
INITIAL OBSTETRICAL VISIT Patient name: [name] MRN [id number]  Date of birth: [birth year] Chief Complaint:   Initial Prenatal Visit  History of Present Illness:   [name] is a 39 y.o. G48P1001 African American female at [redacted]w[redacted]d by LMP c/w 9wk scan with an Estimated Date of Delivery: 01/08/20 being seen today for her initial obstetrical visit.   Her obstetrical history is significant for SVB 2004- hx PPH; delivered at [facility].   Today she reports nausea, no vomiting; some ext vag itching.  Patient's last menstrual period was 04/03/2019 (exact date). Last pap Dec 2019. Results were: normal Review of Systems:   Pertinent items are noted in HPI Denies cramping/contractions, leakage of fluid, vaginal bleeding, abnormal vaginal discharge w/ itching/odor/irritation, headaches, visual changes, shortness of breath, chest pain, abdominal pain, severe nausea/vomiting, or problems with urination or bowel movements unless otherwise stated above.  Pertinent History Reviewed:  Reviewed past medical,surgical, social, obstetrical and family history.  Reviewed problem list, medications and allergies. OB History  Gravida Para Term Preterm AB Living  2 1 1    0 1  SAB TAB Ectopic Multiple Live Births  0       1    # Outcome Date GA Lbr Len/2nd Weight Sex Delivery Anes PTL Lv  2 Current           1 Term 11/19/02 [redacted]w[redacted]d  6 lb 14 oz (3.118 kg) M Vag-Spont EPI N LIV     Complications: Postpartum hemorrhage   Physical Assessment:   Vitals:   07/01/19 0902  BP: (!) 144/91  Pulse: 88  Weight: 298 lb (135.2 kg)  Body mass index is 49.59 kg/m.       Physical Examination:  General appearance - well appearing, and in no distress  Mental status - alert, oriented to person, place, and time  Psych:  She has a normal mood and affect  Skin - warm and dry, normal color, no suspicious lesions noted  Chest - effort normal, all lung fields clear to auscultation bilaterally  Heart -  normal rate and regular rhythm  Abdomen - soft, nontender  Extremities:  No swelling or varicosities noted  Pelvic - deferred  Thin prep pap is not done   TODAY'S NT [place] 12+5 wks,measurements c/w dates,crl 63.09 mm,NB present,NT 1.4 mm,normal ovaries,anterior placenta gr 0   No results found for this or any previous visit (from the past 24 hour(s)).  Assessment & Plan:  1) High-Risk Pregnancy G2P1001 at [redacted]w[redacted]d with an Estimated Date of Delivery: 01/08/20   2) Initial OB visit  3) cHTN, currently on Norvasc 10mg ; borderline BP today- pt to call if she is having consistent >140/90 at home for med adjustment; rec BASA 162mg /day; growth scans 24-28-32-36; ante testing @ 32; IOL at 38-39 (36-37 with poor control)  4) External vag itching, rx terconazole  5) AMA  6) Asthma, rescue inhaler prn  7) Hx PPH (1500cc), may give TXA during delivery  Meds:  Meds ordered this encounter  Medications  . aspirin (ASPIRIN CHILDRENS) 81 MG chewable tablet    Sig: Chew 2 tablets (162 mg total) by mouth daily.    Dispense:  60 tablet    Refill:  10    Order Specific Question:   Supervising Provider    Answer:   Jonnie Kind [[id number]]  . terconazole (TERAZOL 7) 0.4 % vaginal cream    Sig: Place 1 applicator vaginally at bedtime.    Dispense:  45 g  Refill:  0    Order Specific Question:   Supervising Provider    Answer:   Jonnie Kind [phone]    Initial labs obtained Continue prenatal vitamins Reviewed n/v relief measures and warning s/s to report Reviewed recommended weight gain based on pre-gravid BMI Encouraged well-balanced diet Genetic Screening discussed: requested Cystic fibrosis, SMA, Fragile X screening discussed requested Ultrasound discussed; fetal survey: requested CCNC completed>PCM not here, form faxed The nature of [place] for [facility] with multiple MDs and other Advanced Practice Providers was explained to patient; also emphasized that fellows,  residents, and students are part of our team. Has home bp cuff. Check bp weekly, let us know if >140/90.   Indications for ASA therapy (per uptodate) One of the following: CHTN Yes  Indications for early 1 hour GTT (per uptodate)  BMI >=25 (>=23 in Asian women) AND one of the following  High-risk race/ethnicity (eg, African American, Latino, Native American, [place] American, [place]) Yes HTN or on therapy for hypertension Yes   Follow-up: Return in about 6 weeks (around 08/12/2019) for LROB, [place]: Anatomy, 2nd IT, in person.   Orders Placed This Encounter  Procedures  . Urine Culture  . GC/Chlamydia Probe Amp  . US OB Comp + 14 Wk  . Integrated 1  . Hepatitis C antibody  . Obstetric Panel, Including HIV  . Hemoglobinopathy evaluation  . Inheritest Core(CF97,SMA,FraX)  . MaterniT 21 plus Core, Blood  . Hemoglobin A1c  . Pain Management Screening Profile (10S)  . POC Urinalysis Dipstick OB    [facility]rt[facility] 07/01/2019 11:35 AM

## 2019-07-02 LAB — PMP SCREEN PROFILE (10S), URINE
Amphetamine Scrn, Ur: NEGATIVE ng/mL
BARBITURATE SCREEN URINE: NEGATIVE ng/mL
BENZODIAZEPINE SCREEN, URINE: NEGATIVE ng/mL
CANNABINOIDS UR QL SCN: NEGATIVE ng/mL
Cocaine (Metab) Scrn, Ur: NEGATIVE ng/mL
Creatinine(Crt), U: 102.9 mg/dL (ref 20.0–300.0)
Methadone Screen, Urine: NEGATIVE ng/mL
OXYCODONE+OXYMORPHONE UR QL SCN: NEGATIVE ng/mL
Opiate Scrn, Ur: NEGATIVE ng/mL
Ph of Urine: 6.9 (ref 4.5–8.9)
Phencyclidine Qn, Ur: NEGATIVE ng/mL
Propoxyphene Scrn, Ur: NEGATIVE ng/mL

## 2019-07-02 LAB — MED LIST OPTION NOT SELECTED

## 2019-07-03 ENCOUNTER — Telehealth: Payer: Self-pay | Admitting: *Deleted

## 2019-07-03 LAB — GC/CHLAMYDIA PROBE AMP
Chlamydia trachomatis, NAA: NEGATIVE
Neisseria Gonorrhoeae by PCR: NEGATIVE

## 2019-07-03 LAB — URINE CULTURE

## 2019-07-03 NOTE — Telephone Encounter (Signed)
[facility] left message that patients blood pressure was elevated 144/91. Three times the blood pressure was the same. No symptoms.

## 2019-07-06 ENCOUNTER — Telehealth: Payer: Self-pay | Admitting: *Deleted

## 2019-07-06 NOTE — Telephone Encounter (Signed)
Baby scripts left a message pt bp was elevated 137/90

## 2019-07-15 LAB — OBSTETRIC PANEL, INCLUDING HIV
Antibody Screen: NEGATIVE
Basophils Absolute: 0.1 10*3/uL (ref 0.0–0.2)
Basos: 1 %
EOS (ABSOLUTE): 0.3 10*3/uL (ref 0.0–0.4)
Eos: 3 %
HIV Screen 4th Generation wRfx: NONREACTIVE
Hematocrit: 38.8 % (ref 34.0–46.6)
Hemoglobin: 13.1 g/dL (ref 11.1–15.9)
Hepatitis B Surface Ag: NEGATIVE
Immature Grans (Abs): 0 10*3/uL (ref 0.0–0.1)
Immature Granulocytes: 0 %
Lymphocytes Absolute: 2.2 10*3/uL (ref 0.7–3.1)
Lymphs: 21 %
MCH: 30.8 pg (ref 26.6–33.0)
MCHC: 33.8 g/dL (ref 31.5–35.7)
MCV: 91 fL (ref 79–97)
Monocytes Absolute: 0.5 10*3/uL (ref 0.1–0.9)
Monocytes: 5 %
Neutrophils Absolute: 7.5 10*3/uL — ABNORMAL HIGH (ref 1.4–7.0)
Neutrophils: 70 %
Platelets: 362 10*3/uL (ref 150–450)
RBC: 4.25 x10E6/uL (ref 3.77–5.28)
RDW: 12 % (ref 11.7–15.4)
RPR Ser Ql: NONREACTIVE
Rh Factor: POSITIVE
Rubella Antibodies, IGG: 20.7 index (ref >0.99)
WBC: 10.6 10*3/uL (ref 3.4–10.8)

## 2019-07-15 LAB — HGB FRACTIONATION CASCADE
Hgb A2: 2.9 % (ref 1.8–3.2)
Hgb A: 97.1 % (ref 96.4–98.8)
Hgb F: 0 % (ref 0.0–2.0)
Hgb S: 0 %

## 2019-07-15 LAB — INTEGRATED 1
Crown Rump Length: 63.1 mm
Gest. Age on Collection Date: 12.6 weeks
Maternal Age at EDD: 37.2 yr
Nuchal Translucency (NT): 1.4 mm
Number of Fetuses: 1
PAPP-A Value: 955.6 ng/mL
Weight: 298 [lb_av]

## 2019-07-15 LAB — INHERITEST CORE(CF97,SMA,FRAX)

## 2019-07-15 LAB — MATERNIT 21 PLUS CORE, BLOOD
Fetal Fraction: 3
Result (T21): NEGATIVE
Trisomy 13 (Patau syndrome): NEGATIVE
Trisomy 18 (Edwards syndrome): NEGATIVE
Trisomy 21 (Down syndrome): NEGATIVE

## 2019-07-15 LAB — HEPATITIS C ANTIBODY: Hep C Virus Ab: <0.1 s/co ratio (ref 0.0–0.9)

## 2019-08-11 ENCOUNTER — Other Ambulatory Visit: Payer: Self-pay | Admitting: Advanced Practice Midwife

## 2019-08-11 DIAGNOSIS — Z363 Encounter for antenatal screening for malformations: Secondary | ICD-10-CM

## 2019-08-12 ENCOUNTER — Ambulatory Visit (INDEPENDENT_AMBULATORY_CARE_PROVIDER_SITE_OTHER): Payer: BC Managed Care – PPO

## 2019-08-12 ENCOUNTER — Other Ambulatory Visit: Payer: Self-pay

## 2019-08-12 ENCOUNTER — Ambulatory Visit (INDEPENDENT_AMBULATORY_CARE_PROVIDER_SITE_OTHER): Payer: BC Managed Care – PPO | Admitting: Advanced Practice Midwife

## 2019-08-12 VITALS — BP 139/94 | HR 84 | Wt 298.0 lb

## 2019-08-12 DIAGNOSIS — O0992 Supervision of high risk pregnancy, unspecified, second trimester: Secondary | ICD-10-CM

## 2019-08-12 DIAGNOSIS — Z3A18 18 weeks gestation of pregnancy: Secondary | ICD-10-CM

## 2019-08-12 DIAGNOSIS — Z363 Encounter for antenatal screening for malformations: Secondary | ICD-10-CM | POA: Diagnosis not present

## 2019-08-12 DIAGNOSIS — O10919 Unspecified pre-existing hypertension complicating pregnancy, unspecified trimester: Secondary | ICD-10-CM

## 2019-08-12 DIAGNOSIS — O10012 Pre-existing essential hypertension complicating pregnancy, second trimester: Secondary | ICD-10-CM

## 2019-08-12 DIAGNOSIS — Z302 Encounter for sterilization: Secondary | ICD-10-CM

## 2019-08-12 DIAGNOSIS — Z3482 Encounter for supervision of other normal pregnancy, second trimester: Secondary | ICD-10-CM

## 2019-08-12 LAB — POCT URINALYSIS DIPSTICK OB
Blood, UA: NEGATIVE
Glucose, UA: NEGATIVE
Ketones, UA: NEGATIVE
Leukocytes, UA: NEGATIVE
Nitrite, UA: NEGATIVE

## 2019-08-12 MED ORDER — LABETALOL HCL 200 MG PO TABS: 200.0000 mg | ORAL_TABLET | Freq: Two times a day (BID) | ORAL | 3 refills | Status: DC

## 2019-08-12 NOTE — Progress Notes (Signed)
[place] 18+5 wks,breech,fhr 138 bpm,anterior placenta gr 0,cx 3.7 cm,normal ovaries,svp of fluid 3.8 cm,EFW 241 G 30%,anatomy complete,no obvious abnormalities

## 2019-08-12 NOTE — Patient Instructions (Signed)
Carrington Clamp Hurlbut, I greatly value your feedback.  If you receive a survey following your visit with [place] today, we appreciate you taking the time to fill it out.  Thanks, Derrill Memo, [place] at [facility] ([place], [place] [address]) Entrance C, located off of [place] parking  Go to [email or web address] to register for FREE online childbirth classes  [place] Pediatricians/Family Doctors:  [facility] [place] [phone]                 [place] [phone] (usually not accepting new patients unless you have family there already, you are always welcome to call and ask)       [facility] Department [phone]       [facility] Pediatricians/Family Doctors:   [facility]: [phone]  [facility]: [phone]  [facility]: [place] Doctors:   [facility]: [facility]: [place]:  [place]: [phone]    Home Blood Pressure Monitoring for Patients   Your provider has recommended that you check your blood pressure (BP) at least once a week at home. If you do not have a blood pressure cuff at home, one will be provided for you. Contact your provider if you have not received your monitor within 1 week.   Helpful Tips for Accurate Home Blood Pressure Checks  . Don't smoke, exercise, or drink caffeine 30 minutes before checking your BP . Use the restroom before checking your BP (a full bladder can raise your pressure) . Relax in a comfortable upright chair . Feet on the ground . Left arm resting comfortably on a flat surface at the level of your heart . Legs uncrossed . Back supported . Sit quietly and don't talk . Place the cuff on your bare arm . Adjust snuggly, so that  only two fingertips can fit between your skin and the top of the cuff . Check 2 readings separated by at least one minute . Keep a log of your BP readings . For a visual, please reference this diagram: http://ccnc.care/bpdiagram  Provider Name: [facility]     Phone: [phone]  Zone 1: ALL CLEAR  Continue to monitor your symptoms:  . BP reading is less than 140 (top number) or less than 90 (bottom number)  . No right upper stomach pain . No headaches or seeing spots . No feeling nauseated or throwing up . No swelling in face and hands  Zone 2: CAUTION Call your doctor's office for any of the following:  . BP reading is greater than 140 (top number) or greater than 90 (bottom number)  . Stomach pain under your ribs in the middle or right side . Headaches or seeing spots . Feeling nauseated or throwing up . Swelling in face and hands  Zone 3: EMERGENCY  Seek immediate medical care if you have any of the following:  . BP reading is greater than160 (top number) or greater than 110 (bottom number) . Severe headaches not improving with Tylenol . Serious difficulty catching your breath . Any worsening symptoms from Zone 2     Second Trimester of Pregnancy The second trimester is from week 14 through week 27 (months 4 through 6). The second trimester is often a time when you feel your best.  Your body has adjusted to being pregnant, and you begin to feel better physically. Usually, morning sickness has lessened or quit completely, you may have more energy, and you may have an increase in appetite. The second trimester is also a time when the fetus is growing rapidly. At the end of the sixth month, the fetus is about 9 inches long and weighs about 1 pounds. You will likely begin to feel the baby move (quickening) between 16 and 20 weeks of pregnancy. Body changes during your second trimester Your body continues to go through many changes during your second trimester. The changes  vary from woman to woman.  Your weight will continue to increase. You will notice your lower abdomen bulging out.  You may begin to get stretch marks on your hips, abdomen, and breasts.  You may develop headaches that can be relieved by medicines. The medicines should be approved by your health care provider.  You may urinate more often because the fetus is pressing on your bladder.  You may develop or continue to have heartburn as a result of your pregnancy.  You may develop constipation because certain hormones are causing the muscles that push waste through your intestines to slow down.  You may develop hemorrhoids or swollen, bulging veins (varicose veins).  You may have back pain. This is caused by: ? Weight gain. ? Pregnancy hormones that are relaxing the joints in your pelvis. ? A shift in weight and the muscles that support your balance.  Your breasts will continue to grow and they will continue to become tender.  Your gums may bleed and may be sensitive to brushing and flossing.  Dark spots or blotches (chloasma, mask of pregnancy) may develop on your face. This will likely fade after the baby is born.  A dark line from your belly button to the pubic area (linea nigra) may appear. This will likely fade after the baby is born.  You may have changes in your hair. These can include thickening of your hair, rapid growth, and changes in texture. Some women also have hair loss during or after pregnancy, or hair that feels dry or thin. Your hair will most likely return to normal after your baby is born.  What to expect at prenatal visits During a routine prenatal visit:  You will be weighed to make sure you and the fetus are growing normally.  Your blood pressure will be taken.  Your abdomen will be measured to track your baby's growth.  The fetal heartbeat will be listened to.  Any test results from the previous visit will be discussed.  Your health care provider may  ask you:  How you are feeling.  If you are feeling the baby move.  If you have had any abnormal symptoms, such as leaking fluid, bleeding, severe headaches, or abdominal cramping.  If you are using any tobacco products, including cigarettes, chewing tobacco, and electronic cigarettes.  If you have any questions.  Other tests that may be performed during your second trimester include:  Blood tests that check for: ? Low iron levels (anemia). ? High blood sugar that affects pregnant women (gestational diabetes) between 73 and 28 weeks. ? Rh antibodies. This is to check for a protein on red blood cells (Rh factor).  Urine tests to check for infections, diabetes, or protein in the urine.  An ultrasound to confirm the proper growth and development of the baby.  An amniocentesis to check for possible genetic problems.  Fetal screens  for spina bifida and Down syndrome.  HIV (human immunodeficiency virus) testing. Routine prenatal testing includes screening for HIV, unless you choose not to have this test.  Follow these instructions at home: Medicines  Follow your health care provider's instructions regarding medicine use. Specific medicines may be either safe or unsafe to take during pregnancy.  Take a prenatal vitamin that contains at least 600 micrograms (mcg) of folic acid.  If you develop constipation, try taking a stool softener if your health care provider approves. Eating and drinking  Eat a balanced diet that includes fresh fruits and vegetables, whole grains, good sources of protein such as meat, eggs, or tofu, and low-fat dairy. Your health care provider will help you determine the amount of weight gain that is right for you.  Avoid raw meat and uncooked cheese. These carry germs that can cause birth defects in the baby.  If you have low calcium intake from food, talk to your health care provider about whether you should take a daily calcium supplement.  Limit foods  that are high in fat and processed sugars, such as fried and sweet foods.  To prevent constipation: ? Drink enough fluid to keep your urine clear or pale yellow. ? Eat foods that are high in fiber, such as fresh fruits and vegetables, whole grains, and beans. Activity  Exercise only as directed by your health care provider. Most women can continue their usual exercise routine during pregnancy. Try to exercise for 30 minutes at least 5 days a week. Stop exercising if you experience uterine contractions.  Avoid heavy lifting, wear low heel shoes, and practice good posture.  A sexual relationship may be continued unless your health care provider directs you otherwise. Relieving pain and discomfort  Wear a good support bra to prevent discomfort from breast tenderness.  Take warm sitz baths to soothe any pain or discomfort caused by hemorrhoids. Use hemorrhoid cream if your health care provider approves.  Rest with your legs elevated if you have leg cramps or low back pain.  If you develop varicose veins, wear support hose. Elevate your feet for 15 minutes, 3-4 times a day. Limit salt in your diet. Prenatal Care  Write down your questions. Take them to your prenatal visits.  Keep all your prenatal visits as told by your health care provider. This is important. Safety  Wear your seat belt at all times when driving.  Make a list of emergency phone numbers, including numbers for family, friends, the hospital, and police and fire departments. General instructions  Ask your health care provider for a referral to a local prenatal education class. Begin classes no later than the beginning of month 6 of your pregnancy.  Ask for help if you have counseling or nutritional needs during pregnancy. Your health care provider can offer advice or refer you to specialists for help with various needs.  Do not use hot tubs, steam rooms, or saunas.  Do not douche or use tampons or scented sanitary  pads.  Do not cross your legs for long periods of time.  Avoid cat litter boxes and soil used by cats. These carry germs that can cause birth defects in the baby and possibly loss of the fetus by miscarriage or stillbirth.  Avoid all smoking, herbs, alcohol, and unprescribed drugs. Chemicals in these products can affect the formation and growth of the baby.  Do not use any products that contain nicotine or tobacco, such as cigarettes and e-cigarettes. If you need help quitting,  ask your health care provider.  Visit your dentist if you have not gone yet during your pregnancy. Use a soft toothbrush to brush your teeth and be gentle when you floss. Contact a health care provider if:  You have dizziness.  You have mild pelvic cramps, pelvic pressure, or nagging pain in the abdominal area.  You have persistent nausea, vomiting, or diarrhea.  You have a bad smelling vaginal discharge.  You have pain when you urinate. Get help right away if:  You have a fever.  You are leaking fluid from your vagina.  You have spotting or bleeding from your vagina.  You have severe abdominal cramping or pain.  You have rapid weight gain or weight loss.  You have shortness of breath with chest pain.  You notice sudden or extreme swelling of your face, hands, ankles, feet, or legs.  You have not felt your baby move in over an hour.  You have severe headaches that do not go away when you take medicine.  You have vision changes. Summary  The second trimester is from week 14 through week 27 (months 4 through 6). It is also a time when the fetus is growing rapidly.  Your body goes through many changes during pregnancy. The changes vary from woman to woman.  Avoid all smoking, herbs, alcohol, and unprescribed drugs. These chemicals affect the formation and growth your baby.  Do not use any tobacco products, such as cigarettes, chewing tobacco, and e-cigarettes. If you need help quitting, ask your  health care provider.  Contact your health care provider if you have any questions. Keep all prenatal visits as told by your health care provider. This is important. This information is not intended to replace advice given to you by your health care provider. Make sure you discuss any questions you have with your health care provider. Document Released: 04/10/2001 Document Revised: 09/22/2015 Document Reviewed: 06/17/2012 Elsevier Interactive Patient Education  2017 [place] FLU! Because you are pregnant, we at [facility], along with the Centers for Disease Control (CDC), recommend that you receive the flu vaccine to protect yourself and your baby from the flu. The flu is more likely to cause severe illness in pregnant women than in women of reproductive age who are not pregnant. Changes in the immune system, heart, and lungs during pregnancy make pregnant women (and women up to two weeks postpartum) more prone to severe illness from flu, including illness resulting in hospitalization. Flu also may be harmful for a pregnant woman's developing baby. A common flu symptom is fever, which may be associated with neural tube defects and other adverse outcomes for a developing baby. Getting vaccinated can also help protect a baby after birth from flu. (Mom passes antibodies onto the developing baby during her pregnancy.)  A Flu Vaccine is the Best Protection Against Flu Getting a flu vaccine is the first and most important step in protecting against flu. Pregnant women should get a flu shot and not the live attenuated influenza vaccine (LAIV), also known as nasal spray flu vaccine. Flu vaccines given during pregnancy help protect both the mother and her baby from flu. Vaccination has been shown to reduce the risk of flu-associated acute respiratory infection in pregnant women by up to one-half. A 2018 study showed that getting a flu shot reduced a pregnant woman's  risk of being hospitalized with flu by an average of 40 percent. Pregnant women who get a flu  vaccine are also helping to protect their babies from flu illness for the first several months after their birth, when they are too young to get vaccinated.   A Long Record of Safety for Flu Shots in Pregnant Women Flu shots have been given to millions of pregnant women over many years with a good safety record. There is a lot of evidence that flu vaccines can be given safely during pregnancy; though these data are limited for the first trimester. The CDC recommends that pregnant women get vaccinated during any trimester of their pregnancy. It is very important for pregnant women to get the flu shot.   Other Preventive Actions In addition to getting a flu shot, pregnant women should take the same everyday preventive actions the CDC recommends of everyone, including covering coughs, washing hands often, and avoiding people who are sick.  Symptoms and Treatment If you get sick with flu symptoms call your doctor right away. There are antiviral drugs that can treat flu illness and prevent serious flu complications. The CDC recommends prompt treatment for people who have influenza infection or suspected influenza infection and who are at high risk of serious flu complications, such as people with asthma, diabetes (including gestational diabetes), or heart disease. Early treatment of influenza in hospitalized pregnant women has been shown to reduce the length of the hospital stay.  Symptoms Flu symptoms include fever, cough, sore throat, runny or stuffy nose, body aches, headache, chills and fatigue. Some people may also have vomiting and diarrhea. People may be infected with the flu and have respiratory symptoms without a fever.  Early Treatment is Important for Pregnant Women Treatment should begin as soon as possible because antiviral drugs work best when started early (within 48 hours after symptoms  start). Antiviral drugs can make your flu illness milder and make you feel better faster. They may also prevent serious health problems that can result from flu illness. Oral oseltamivir (Tamiflu) is the preferred treatment for pregnant women because it has the most studies available to suggest that it is safe and beneficial. Antiviral drugs require a prescription from your provider. Having a fever caused by flu infection or other infections early in pregnancy may be linked to birth defects in a baby. In addition to taking antiviral drugs, pregnant women who get a fever should treat their fever with Tylenol (acetaminophen) and contact their provider immediately.  When to [place] If you are pregnant and have any of these signs, seek care immediately:  Difficulty breathing or shortness of breath  Pain or pressure in the chest or abdomen  Sudden dizziness  Confusion  Severe or persistent vomiting  High fever that is not responding to Tylenol (or store brand equivalent)  Decreased or no movement of your baby  [email or web address]

## 2019-08-12 NOTE — Progress Notes (Signed)
HIGH-RISK PREGNANCY VISIT Patient name: [name] MRN [id number]  Date of birth: 1980/09/13 Chief Complaint:   Routine Prenatal Visit ([place] 2nd IT)  History of Present Illness:   [name] is a 39 y.o. G46P1001 female at 54w5dwith an Estimated Date of Delivery: 01/08/20 being seen today for ongoing management of a high-risk pregnancy complicated by chronic hypertension currently on Norvasc.  Today she reports achy legs at night when she tries to sleep; also vagina is 'sore'.  [facility] 07/01/2019 02/20/2019 10/23/2018 04/10/2018 02/11/2018  Decreased Interest 0 0 0 0 0  Down, Depressed, Hopeless 0 0 0 0 0  PHQ - 2 Score 0 0 0 0 0  Altered sleeping 0 3 - - -  Tired, decreased energy 0 1 - - -  Change in appetite 1 0 - - -  Feeling bad or failure about yourself  0 0 - - -  Trouble concentrating 0 0 - - -  Moving slowly or fidgety/restless 0 0 - - -  Suicidal thoughts 0 0 - - -  PHQ-9 Score 1 4 - - -  Difficult doing work/chores Not difficult at all Not difficult at all - - -  Some recent data might be hidden     . Vag. Bleeding: None.  Movement: Present. denies leaking of fluid.  Review of Systems:   Pertinent items are noted in HPI Denies abnormal vaginal discharge w/ itching/odor/irritation, headaches, visual changes, shortness of breath, chest pain, abdominal pain, severe nausea/vomiting, or problems with urination or bowel movements unless otherwise stated above. Pertinent History Reviewed:  Reviewed past medical,surgical, social, obstetrical and family history.  Reviewed problem list, medications and allergies. Physical Assessment:   Vitals:   08/12/19 0925  BP: (!) 139/94  Pulse: 84  Weight: 298 lb (135.2 kg)  Body mass index is 49.59 kg/m.           Physical Examination:   General appearance: alert, well appearing, and in no distress  Mental status: alert, oriented to person, place, and time  Skin: warm & dry   Extremities: Edema:  None    Cardiovascular: normal heart rate noted  Respiratory: normal respiratory effort, no distress  Abdomen: gravid, soft, non-tender  Pelvic: Cervical exam deferred         Fetal Status: Fetal Heart Rate (bpm): 138 u/s   Movement: Present    Fetal Surveillance Testing today: anatomy u/s  [place]18+5 wks,breech,fhr 138 bpm,anterior placenta gr 0,cx 3.7 cm,normal ovaries,svp of fluid 3.8 cm,EFW 241 G 30%,anatomy complete,no obvious abnormalities   Results for orders placed or performed in visit on 08/12/19 (from the past 24 hour(s))  POC Urinalysis Dipstick OB   Collection Time: 08/12/19  9:30 AM  Result Value Ref Range   Color, UA     Clarity, UA     Glucose, UA Negative Negative   Bilirubin, UA     Ketones, UA neg    Spec Grav, UA     Blood, UA neg    pH, UA     POC,PROTEIN,UA Small (1+) Negative, Trace, Small (1+), Moderate (2+), Large (3+), 4+   Urobilinogen, UA     Nitrite, UA neg    Leukocytes, UA Negative Negative   Appearance     Odor      Assessment & Plan:  1) High-risk pregnancy G2P1001 at 115w5dith an Estimated Date of Delivery: 01/08/20   2) cHTN, currently on Norvasc 1046mbut will change to Labetalol  256m bid given borderline BPs and ability to adjust Labetalol as preg progresses; will start today and have RN BP check in 1wk  3) Hx PPH, consider TXA prior to delivery  4) Strongly considering BTL, will sign papers at 28wks  Meds:  Meds ordered this encounter  Medications  . labetalol (NORMODYNE) 200 MG tablet    Sig: Take 1 tablet (200 mg total) by mouth 2 (two) times daily.    Dispense:  60 tablet    Refill:  3    Order Specific Question:   Supervising Provider    Answer:   FJonnie Kind[[id number]]    Labs/procedures today: 2nd IT, CMP, P/C ratio, HgbA1c (not drawn with NOB labs)  Treatment Plan:  growth 24-28-32-36; 2x/wk NST/AFI for weekly BPP @ 32; IOL 38-39 (or 36-37 for poor control); HgbA1c today for early GDM screening  Reviewed: Preterm labor  symptoms and general obstetric precautions including but not limited to vaginal bleeding, contractions, leaking of fluid and fetal movement were reviewed in detail with the patient.  All questions were answered. Has home bp cuff. Check bp weekly, let u[place]oreaknow if >140/90.   Follow-up: Return in about 1 week (around 08/19/2019) for 1wk BP check with RN; 4wk HROB visit.  Orders Placed This Encounter  Procedures  . INTEGRATED 2  . Comp Met (CMET)  . Protein / creatinine ratio, urine  . HgB A1c  . POC Urinalysis Dipstick OB   KMyrtis SerCMemorial Hospital4/14/2021 9:51 AM

## 2019-08-13 LAB — COMPREHENSIVE METABOLIC PANEL
ALT: 5 IU/L (ref 0–32)
AST: 10 IU/L (ref 0–40)
Albumin/Globulin Ratio: 1.3 (ref 1.2–2.2)
Albumin: 3.4 g/dL — ABNORMAL LOW (ref 3.8–4.8)
Alkaline Phosphatase: 81 IU/L (ref 39–117)
BUN/Creatinine Ratio: 10 (ref 9–23)
BUN: 7 mg/dL (ref 6–20)
Bilirubin Total: <0.2 mg/dL (ref 0.0–1.2)
CO2: 18 mmol/L — ABNORMAL LOW (ref 20–29)
Calcium: 9.1 mg/dL (ref 8.7–10.2)
Chloride: 104 mmol/L (ref 96–106)
Creatinine, Ser: 0.7 mg/dL (ref 0.57–1.00)
GFR calc Af Amer: 129 mL/min/{1.73_m2} (ref >59)
GFR calc non Af Amer: 112 mL/min/{1.73_m2} (ref >59)
Globulin, Total: 2.6 g/dL (ref 1.5–4.5)
Glucose: 121 mg/dL — ABNORMAL HIGH (ref 65–99)
Potassium: 4.1 mmol/L (ref 3.5–5.2)
Sodium: 135 mmol/L (ref 134–144)
Total Protein: 6 g/dL (ref 6.0–8.5)

## 2019-08-13 LAB — HEMOGLOBIN A1C
Est. average glucose Bld gHb Est-mCnc: 94 mg/dL
Hgb A1c MFr Bld: 4.9 % (ref 4.8–5.6)

## 2019-08-13 LAB — PROTEIN / CREATININE RATIO, URINE
Creatinine, Urine: 209.9 mg/dL
Protein, Ur: 27.9 mg/dL
Protein/Creat Ratio: 133 mg/g creat (ref 0–200)

## 2019-08-14 LAB — INTEGRATED 2
AFP MoM: 1.63
Alpha-Fetoprotein: 43 ng/mL
Crown Rump Length: 63.1 mm
DIA MoM: 0.65
DIA Value: 72.7 pg/mL
Estriol, Unconjugated: 1.65 ng/mL
Gest. Age on Collection Date: 12.6 weeks
Gestational Age: 18.6 weeks
Maternal Age at EDD: 37.2 yr
Nuchal Translucency (NT): 1.4 mm
Nuchal Translucency MoM: 0.99
Number of Fetuses: 1
PAPP-A MoM: 2.43
PAPP-A Value: 955.6 ng/mL
Test Results:: NEGATIVE
Weight: 298 [lb_av]
Weight: 298 [lb_av]
hCG MoM: 1.55
hCG Value: 20.6 IU/mL
uE3 MoM: 1.3

## 2019-08-19 ENCOUNTER — Other Ambulatory Visit: Payer: Self-pay

## 2019-08-19 ENCOUNTER — Ambulatory Visit (INDEPENDENT_AMBULATORY_CARE_PROVIDER_SITE_OTHER): Payer: BC Managed Care – PPO

## 2019-08-19 VITALS — BP 115/79 | HR 94 | Ht 65.0 in | Wt 300.0 lb

## 2019-08-19 DIAGNOSIS — O10012 Pre-existing essential hypertension complicating pregnancy, second trimester: Secondary | ICD-10-CM

## 2019-08-19 DIAGNOSIS — O10919 Unspecified pre-existing hypertension complicating pregnancy, unspecified trimester: Secondary | ICD-10-CM

## 2019-08-19 DIAGNOSIS — Z331 Pregnant state, incidental: Secondary | ICD-10-CM

## 2019-08-19 DIAGNOSIS — Z3A19 19 weeks gestation of pregnancy: Secondary | ICD-10-CM

## 2019-08-19 DIAGNOSIS — O09522 Supervision of elderly multigravida, second trimester: Secondary | ICD-10-CM

## 2019-08-19 DIAGNOSIS — Z8759 Personal history of other complications of pregnancy, childbirth and the puerperium: Secondary | ICD-10-CM

## 2019-08-19 DIAGNOSIS — Z1389 Encounter for screening for other disorder: Secondary | ICD-10-CM

## 2019-08-19 DIAGNOSIS — Z302 Encounter for sterilization: Secondary | ICD-10-CM

## 2019-08-19 DIAGNOSIS — O0992 Supervision of high risk pregnancy, unspecified, second trimester: Secondary | ICD-10-CM

## 2019-08-19 LAB — POCT URINALYSIS DIPSTICK OB
Blood, UA: NEGATIVE
Glucose, UA: NEGATIVE
Ketones, UA: NEGATIVE
Leukocytes, UA: NEGATIVE
Nitrite, UA: NEGATIVE

## 2019-08-19 NOTE — Progress Notes (Signed)
   NURSE VISIT- BLOOD PRESSURE CHECK  SUBJECTIVE:  [name] is a 39 y.o. G76P1001 female here for BP check. She is [redacted]w[redacted]d pregnant  BP 115/79 pulse 94   Pregnant: taking amLopioine 10 mg . Visual changes (seeing spots/double/blurred vision) no  . Severe nausea/vomiting no . Taking medicines as instructed yes    OBJECTIVE:  LMP 04/03/2019 (Exact Date)   Appearance alert, well appearing, and in no distress and overweight.  ASSESSMENT:   blood pressure check  PLAN: Discussed with kimberly shaw about BP Recommendations BP good, keep taking BP medicine    Follow-up: in 4 weeks   Ladonna Snide  08/19/2019 10:40 AM

## 2019-09-09 ENCOUNTER — Ambulatory Visit (INDEPENDENT_AMBULATORY_CARE_PROVIDER_SITE_OTHER): Payer: BC Managed Care – PPO | Admitting: Obstetrics and Gynecology

## 2019-09-09 ENCOUNTER — Other Ambulatory Visit: Payer: Self-pay

## 2019-09-09 VITALS — BP 129/81 | HR 91 | Wt 301.0 lb

## 2019-09-09 DIAGNOSIS — Z1389 Encounter for screening for other disorder: Secondary | ICD-10-CM

## 2019-09-09 DIAGNOSIS — Z331 Pregnant state, incidental: Secondary | ICD-10-CM

## 2019-09-09 DIAGNOSIS — Z3A22 22 weeks gestation of pregnancy: Secondary | ICD-10-CM

## 2019-09-09 DIAGNOSIS — O99212 Obesity complicating pregnancy, second trimester: Secondary | ICD-10-CM

## 2019-09-09 DIAGNOSIS — O10912 Unspecified pre-existing hypertension complicating pregnancy, second trimester: Secondary | ICD-10-CM

## 2019-09-09 LAB — POCT URINALYSIS DIPSTICK OB
Blood, UA: NEGATIVE
Glucose, UA: NEGATIVE
Leukocytes, UA: NEGATIVE
Nitrite, UA: NEGATIVE

## 2019-09-09 NOTE — Progress Notes (Signed)
PATIENT ID: [name], female     DOB: [birth year], 39 y.o.     MRN: [id number]     [facility] PREGNANCY VISIT PATIENT NAME: [name] MRN [id number]  DOB: 02-Sep-1980 Chief Complaint:   Routine Prenatal Visit  History of Present Illness:   [name] is a 39 y.o. G59P1001 female at [redacted]w[redacted]d with an Estimated Date of Delivery: 01/08/20 being seen today for ongoing management of a high-risk pregnancy complicated by chronic hypertension currently on Norvasc 200 mg BID.  Today she reports nausea and neck pain with exertion.   She has been checking her blood pressure at home. Yesterday her blood pressure was 145/90.   Depression screen [facility] 2/9 07/01/2019 02/20/2019 10/23/2018 04/10/2018 02/11/2018  Decreased Interest 0 0 0 0 0  Down, Depressed, Hopeless 0 0 0 0 0  PHQ - 2 Score 0 0 0 0 0  Altered sleeping 0 3 - - -  Tired, decreased energy 0 1 - - -  Change in appetite 1 0 - - -  Feeling bad or failure about yourself  0 0 - - -  Trouble concentrating 0 0 - - -  Moving slowly or fidgety/restless 0 0 - - -  Suicidal thoughts 0 0 - - -  PHQ-9 Score 1 4 - - -  Difficult doing work/chores Not difficult at all Not difficult at all - - -  Some recent data might be hidden   Contractions: Not present. Vag. Bleeding: None.  Movement: Present. denies leaking of fluid.   Review of Systems:   Pertinent items are noted in HPI Denies abnormal vaginal discharge w/ itching/odor/irritation, headaches, visual changes, shortness of breath, chest pain, abdominal pain, severe nausea/vomiting, or problems with urination or bowel movements unless otherwise stated above.  Pertinent History Reviewed:  Reviewed past medical,surgical, social, obstetrical and family history.  Reviewed problem list, medications and allergies.  Physical Assessment:   Vitals:   09/09/19 1012  BP: 129/81  Pulse: 91  Weight: (!) 301 lb (136.5 kg)  Body mass index is 50.09 kg/m.           Physical  Examination:   General appearance: alert, well appearing, and in no distress, oriented to person, place, and time, overweight and well hydrated  Mental status: alert, oriented to person, place, and time, normal mood, behavior, speech, dress, motor activity, and thought processes, affect appropriate to mood  Skin: warm & dry   Extremities: Edema: Trace    Cardiovascular: normal heart rate noted  Respiratory: normal respiratory effort, no distress  Abdomen: gravid, soft, non-tender   Baby: 145 bpm   23 cm  Pelvic: Cervical exam deferred         Fetal Status:     Movement: Present    Fetal Surveillance Testing today: fhr only   Chaperone: Biomedical scientist    Results for orders placed or performed in visit on 09/09/19 (from the past 24 hour(s))  POC Urinalysis Dipstick OB   Collection Time: 09/09/19 10:10 AM  Result Value Ref Range   Color, UA     Clarity, UA     Glucose, UA Negative Negative   Bilirubin, UA     Ketones, UA trace    Spec Grav, UA     Blood, UA n    pH, UA     POC,PROTEIN,UA Small (1+) Negative, Trace, Small (1+), Moderate (2+), Large (3+), 4+   Urobilinogen, UA     Nitrite, UA n  Leukocytes, UA Negative Negative   Appearance     Odor       Assessment & Plan:  1) High-risk pregnancy G2P1001 at [redacted]w[redacted]d with an Estimated Date of Delivery: 01/08/20   2) cHTN  stable  3) obesity, stable 4) neck discomfort, to see chiropractor Meds: No orders of the defined types were placed in this encounter.   Labs/procedures today: n/a  Treatment Plan:  Continue Labetalol(normodyne) 200 bid  Reviewed:  labor symptoms and general obstetric precautions including but not limited to vaginal bleeding, contractions, leaking of fluid and fetal movement were reviewed in detail with the patient.  All questions were answered. Check bpdaily, let us know if >140/90.    Follow-up: 4 wk PN2,   By signing my name below, I, [employer], attest that this documentation has been prepared  under the direction and in the presence of Jonnie Kind, MD. Electronically Signed: [place]. 09/09/19. 10:39 AM.  I personally performed the services described in this documentation, which was SCRIBED in my presence. The recorded information has been reviewed and considered accurate. It has been edited as necessary during review. Jonnie Kind, MD

## 2019-09-11 ENCOUNTER — Encounter: Payer: Self-pay | Admitting: Family Medicine

## 2019-09-11 ENCOUNTER — Other Ambulatory Visit: Payer: Self-pay

## 2019-09-11 ENCOUNTER — Ambulatory Visit (INDEPENDENT_AMBULATORY_CARE_PROVIDER_SITE_OTHER): Payer: BC Managed Care – PPO | Admitting: Family Medicine

## 2019-09-11 VITALS — BP 126/76 | HR 90 | Temp 98.0°F | Resp 16 | Ht 65.0 in | Wt 301.0 lb

## 2019-09-11 DIAGNOSIS — M542 Cervicalgia: Secondary | ICD-10-CM

## 2019-09-11 DIAGNOSIS — M79673 Pain in unspecified foot: Secondary | ICD-10-CM

## 2019-09-11 NOTE — Progress Notes (Signed)
   Subjective:    Patient ID: [name], female    DOB: March 26, 1981, 39 y.o.   MRN: [id number]  Patient presents for B Foot Pain (x weeks- in arches) and Neck Pain (x1 month- pain in neck- decreased ROM)   Currently pregnant [redacted] weeks     Foot pain bolat at arches for past few weeks- Get some foot swelling, ankle sweling, she has pain at the arches   when she wears a flat sandel has more pain    Neck pain- pain at the base of the neck for the past month. When she is showering or cooking she has to sit down because of pain, occ headaches and nausea.  No injury to neck She has been taking tylenol  No new tingling or numbness in hands  She has been trying to get her husband to massage which helps    Meds reviewed    Review Of Systems:  GEN- denies fatigue, fever, weight loss,weakness, recent illness HEENT- denies eye drainage, change in vision, nasal discharge, CVS- denies chest pain, palpitations RESP- denies SOB, cough, wheeze ABD- denies N/V, change in stools, abd pain GU- denies dysuria, hematuria, dribbling, incontinence MSK- + joint pain, muscle aches, injury Neuro- denies headache, dizziness, syncope, seizure activity       Objective:    BP 126/76   Pulse 90   Temp 98 F (36.7 C) (Temporal)   Resp 16   Ht 5\' 5"  (1.651 m)   Wt (!) 301 lb (136.5 kg)   LMP 04/03/2019 (Exact Date)   SpO2 93%   BMI 50.09 kg/m  GEN- NAD, alert and oriented x3 HEENT- PERRL, EOMI, non injected sclera, pink conjunctiva, MMM, oropharynx clear Neck- Supple, no thyromegaly, TTP base of c spine, good ROM, neg spurlings, no spasm CVS- RRR, no murmur RESP-CTAB EXT- No edema, TTP at bilat mid arches of foot, no heel tenderness, achilles in tact  NEURO-CNII-XII grossly in tact, FROM UE, sensation grossly in tact UE  Pulses- Radial, DP- 2+        Assessment & Plan:      Problem List Items Addressed This Visit    None    Visit Diagnoses    Foot arch pain, unspecified  laterality    -  Primary   recommend insoles into her shoe for arch support, she is pregnant and gets foot swelling, i dont see a reason to get podiatry to fit for any support at this time    Neck pain       MSK pain, tylenol, heating pad, massage safest for now, would not risk imaging as no red flags and pt pregnant, if still present post partum can work up, pt agreed      Note: This dictation was prepared with Diplomatic Services operational officer dictation along with smaller Company secretary. Any transcriptional errors that result from this process are unintentional.

## 2019-09-11 NOTE — Patient Instructions (Signed)
Tylenol for pain  Use heating pad for neck Stretches Get arch support over the counter  F/U as needed

## 2019-09-13 ENCOUNTER — Encounter: Payer: Self-pay | Admitting: Family Medicine

## 2019-09-18 ENCOUNTER — Encounter: Payer: Self-pay | Admitting: Allergy & Immunology

## 2019-09-18 ENCOUNTER — Ambulatory Visit (INDEPENDENT_AMBULATORY_CARE_PROVIDER_SITE_OTHER): Payer: BC Managed Care – PPO | Admitting: Allergy & Immunology

## 2019-09-18 ENCOUNTER — Other Ambulatory Visit: Payer: Self-pay

## 2019-09-18 VITALS — BP 128/86 | HR 100 | Temp 97.7°F | Resp 20

## 2019-09-18 DIAGNOSIS — T781XXD Other adverse food reactions, not elsewhere classified, subsequent encounter: Secondary | ICD-10-CM

## 2019-09-18 DIAGNOSIS — J4540 Moderate persistent asthma, uncomplicated: Secondary | ICD-10-CM

## 2019-09-18 DIAGNOSIS — J3089 Other allergic rhinitis: Secondary | ICD-10-CM | POA: Diagnosis not present

## 2019-09-18 DIAGNOSIS — O99512 Diseases of the respiratory system complicating pregnancy, second trimester: Secondary | ICD-10-CM

## 2019-09-18 DIAGNOSIS — J302 Other seasonal allergic rhinitis: Secondary | ICD-10-CM

## 2019-09-18 DIAGNOSIS — J45909 Unspecified asthma, uncomplicated: Secondary | ICD-10-CM

## 2019-09-18 MED ORDER — MONTELUKAST SODIUM 10 MG PO TABS: 10.0000 mg | ORAL_TABLET | Freq: Every day | ORAL | 5 refills | Status: DC

## 2019-09-18 MED ORDER — ALBUTEROL SULFATE HFA 108 (90 BASE) MCG/ACT IN AERS: INHALATION_SPRAY | RESPIRATORY_TRACT | 2 refills | Status: DC

## 2019-09-18 MED ORDER — PAZEO 0.7 % OP SOLN: 1.0000 [drp] | OPHTHALMIC | 5 refills | Status: DC

## 2019-09-18 MED ORDER — BREO ELLIPTA 200-25 MCG/INH IN AEPB: 1.0000 | INHALATION_SPRAY | Freq: Every day | RESPIRATORY_TRACT | 2 refills | Status: DC

## 2019-09-18 NOTE — Patient Instructions (Addendum)
1. Moderate persistent asthma, uncomplicated - Lung testing deferred today.  - We are going to refill your medications must in case you feel that you need to start something.  - Daily controller medication(s): Singulair 10mg  daily and Breo 200/34mcg one puff once daily - Prior to physical activity: Proventil 2 puffs 10-15 minutes before physical activity. - Rescue medications: Proventil 4 puffs every 4-6 hours as needed - Asthma control goals:  * Full participation in all desired activities (may need albuterol before activity) * Albuterol use two time or less a week on average (not counting use with activity) * Cough interfering with sleep two time or less a month * Oral steroids no more than once a year * No hospitalizations  2. Chronic rhinitis (trees, indoor molds, outdoor molds and cat) - Continue taking: flunisolide 1-2 sprays per nostril twice daly, Pazeo (olopatadine) one drop per eye daily as needed, and Singulair (montelukast) 10mg  daily - You can use an extra dose of the antihistamine, if needed, for breakthrough symptoms.  - Consider nasal saline rinses 1-2 times daily to remove allergens from the nasal cavities as well as help with mucous clearance (this is especially helpful to do before the nasal sprays are g iven) - Consider allergy shots as a means of long-term control, once the baby is born.   3. Adverse food reaction (banana and pineapple) - Continue to avoid these foods. [name] training refilled. - Anaphylaxis management plan provided.   4. Return in about 2 months (around 11/18/2019). This can be an in-person, a virtual Webex or a telephone follow up visit.   Please inform us of any Emergency Department visits, hospitalizations, or changes in symptoms. Call us before going to the ED for breathing or allergy symptoms since we might be able to fit you in for a sick visit. Feel free to contact us anytime with any questions, problems, or concerns.  It was a pleasure to  see you again today! Congrats on your pregnancy!   Websites that have reliable patient information: 1. American Academy of Asthma, Allergy, and Immunology: www.aaaai.org 2. Food Allergy Research and Education (FARE): foodallergy.org 3. Mothers of Asthmatics: http://www.asthmacommunitynetwork.org 4. American College of Allergy, Asthma, and Immunology: www.acaai.org   COVID-19 Vaccine Information can be found at: ShippingScam.co.uk For questions related to vaccine distribution or appointments, please email [email or web address] or call 8106405114.     "Like" [place] on Facebook and Instagram for our latest updates!       HAPPY SPRING!  Make sure you are registered to vote! If you have moved or changed any of your contact information, you will need to get this updated before voting!  In some cases, you MAY be able to register to vote online: [email or web address]

## 2019-09-18 NOTE — Progress Notes (Signed)
FOLLOW UP  Date of Service/Encounter:  09/18/19   Assessment:   Moderate persistent asthma - complicated by pregnancy in the 2nd trimester  Seasonal and perennial allergic rhinitis(trees, indoor molds, outdoor molds and cat) - not well controlled  Adverse food reaction(pineapple, banana) - giving epinephrine autoinjector out of an abundance of caution)  Plan/Recommendations:   1. Moderate persistent asthma, uncomplicated - Lung testing deferred today.  - We are going to refill your medications must in case you feel that you need to start something.  - Daily controller medication(s): Singulair 10mg  daily and Breo 200/8mcg one puff once daily - Prior to physical activity: Proventil 2 puffs 10-15 minutes before physical activity. - Rescue medications: Proventil 4 puffs every 4-6 hours as needed - Asthma control goals:  * Full participation in all desired activities (may need albuterol before activity) * Albuterol use two time or less a week on average (not counting use with activity) * Cough interfering with sleep two time or less a month * Oral steroids no more than once a year * No hospitalizations  2. Chronic rhinitis (trees, indoor molds, outdoor molds and cat) - Continue taking: flunisolide 1-2 sprays per nostril twice daly, Pazeo (olopatadine) one drop per eye daily as needed, and Singulair (montelukast) 10mg  daily - You can use an extra dose of the antihistamine, if needed, for breakthrough symptoms.  - Consider nasal saline rinses 1-2 times daily to remove allergens from the nasal cavities as well as help with mucous clearance (this is especially helpful to do before the nasal sprays are g iven) - Consider allergy shots as a means of long-term control, once the baby is born.   3. Adverse food reaction (banana and pineapple) - Continue to avoid these foods. [name] training refilled. - Anaphylaxis management plan provided.   4. Return in about 2 months (around  11/18/2019). This can be an in-person, a virtual Webex or a telephone follow up visit.  Subjective:   [name] is a 39 y.o. female presenting today for follow up of  Chief Complaint  Patient presents with  . Asthma    Having some SOB when active (climbing steps). She relates this to pregnancy. (6 months)    [name] has a history of the following: Patient Active Problem List   Diagnosis Date Noted  . Request for sterilization 08/12/2019  . High-risk pregnancy 07/01/2019  . Chronic hypertension in pregnancy 07/01/2019  . AMA (advanced maternal age) multigravida 35+ 07/01/2019  . History of postpartum hemorrhage 07/01/2019  . Moderate persistent asthma, uncomplicated [id number]  . Seasonal and perennial allergic rhinitis 02/12/2018  . Cysts of both ovaries 03/12/2017  . Plantar fasciitis 12/16/2015  . Fibroids, intramural 08/24/2015  . Chronic breast pain 05/31/2015  . Glucose intolerance (impaired glucose tolerance) 03/16/2015  . Knee sprain 03/16/2015  . Polyclonal gammopathy 07/26/2013  . Dysplasia of cervix, unspecified 03/31/2013  . Insomnia 12/09/2011  . Asthma in adult 04/18/2011  . Hypertension 09/21/2010  . Morbid obesity ([place]) 11/03/2007  . Chronic low back pain 11/03/2007    History obtained from: chart review and patient.  [name] is a 39 y.o. female presenting for a follow up visit.   Since the last visit, she has done well. She is now 6 months pregnant. She is having some high blood pressure. They are planning to induce at the end of August because of this.   Asthma/Respiratory Symptom History: She has not been using a daily inhaler at all. She  reports that it was causing her to have coughing spells around three weeks ago. She has not felt bad without it. The SOB episodes have not changed since stopping the Breo. Most of the time, she tries to stay away from walking. She does need refills of it.  Allergic Rhinitis Symptom History:  She does report some worsening stuffiness during the pregnancy. She remains on the montelukast. She liked the [place] but it was not covered at all. She was having chronic congestion but the Truett Perna was "magic". She is now just using the flunisolide since that it what her insurance covers. She has not needed antibiotics at all since the last visit.   GERD Symptom History: She remains on the famotidine twice daily as needed. Symptoms have worsened since the pregnancy.   Otherwise, there have been no changes to her past medical history, surgical history, family history, or social history.    Review of Systems  Constitutional: Negative.  Negative for chills, fever, malaise/fatigue and weight loss.  HENT: Positive for congestion and sinus pain. Negative for ear discharge, ear pain and sore throat.   Eyes: Negative for pain, discharge and redness.  Respiratory: Positive for shortness of breath. Negative for cough, sputum production and wheezing.   Cardiovascular: Negative.  Negative for chest pain and palpitations.  Gastrointestinal: Negative for abdominal pain, constipation, diarrhea, heartburn, nausea and vomiting.  Skin: Negative.  Negative for itching and rash.  Neurological: Negative for dizziness and headaches.  Endo/Heme/Allergies: Negative for environmental allergies. Does not bruise/bleed easily.       Objective:   Blood pressure 128/86, pulse 100, temperature 97.7 F (36.5 C), temperature source Temporal, resp. rate 20, last menstrual period 04/03/2019, SpO2 99 %. There is no height or weight on file to calculate BMI.   Physical Exam:  Physical Exam  Constitutional: She appears well-developed and well-nourished.  Pleasant interactive female. t  HENT:  Head: Normocephalic and atraumatic.  Right Ear: Tympanic membrane, external ear and ear canal normal.  Left Ear: Tympanic membrane, external ear and ear canal normal.  Nose: Mucosal edema and rhinorrhea present. No nasal  deformity or septal deviation. No epistaxis. Right sinus exhibits no maxillary sinus tenderness and no frontal sinus tenderness. Left sinus exhibits no maxillary sinus tenderness and no frontal sinus tenderness.  Mouth/Throat: Uvula is midline and oropharynx is clear and moist. Mucous membranes are not pale and not dry.  Tonsils without exudates bilaterally. There is clear rhinorrhea bilaterally.   Eyes: Pupils are equal, round, and reactive to light. Conjunctivae and EOM are normal. Right eye exhibits no chemosis and no discharge. Left eye exhibits no chemosis and no discharge. Right conjunctiva is not injected. Left conjunctiva is not injected.  Cardiovascular: Normal rate, regular rhythm and normal heart sounds.  Respiratory: Effort normal and breath sounds normal. No accessory muscle usage. No tachypnea. No respiratory distress. She has no wheezes. She has no rhonchi. She has no rales. She exhibits no tenderness.  Moving air well throughout all lung fields. No increased work of breathing noted.   Lymphadenopathy:    She has no cervical adenopathy.  Neurological: She is alert.  Skin: No abrasion, no petechiae and no rash noted. Rash is not papular, not vesicular and not urticarial. No erythema. No pallor.  Psychiatric: She has a normal mood and affect.     Diagnostic studies: none       Salvatore Marvel, MD  [facility] [place] [facility] [place]

## 2019-10-07 ENCOUNTER — Other Ambulatory Visit: Payer: Self-pay | Admitting: *Deleted

## 2019-10-07 ENCOUNTER — Ambulatory Visit (INDEPENDENT_AMBULATORY_CARE_PROVIDER_SITE_OTHER): Payer: BC Managed Care – PPO | Admitting: Women's Health

## 2019-10-07 ENCOUNTER — Other Ambulatory Visit: Payer: BC Managed Care – PPO

## 2019-10-07 ENCOUNTER — Encounter: Payer: Self-pay | Admitting: *Deleted

## 2019-10-07 VITALS — BP 136/80 | HR 91 | Wt 302.2 lb

## 2019-10-07 DIAGNOSIS — O0993 Supervision of high risk pregnancy, unspecified, third trimester: Secondary | ICD-10-CM

## 2019-10-07 DIAGNOSIS — Z131 Encounter for screening for diabetes mellitus: Secondary | ICD-10-CM | POA: Diagnosis not present

## 2019-10-07 DIAGNOSIS — O10912 Unspecified pre-existing hypertension complicating pregnancy, second trimester: Secondary | ICD-10-CM

## 2019-10-07 DIAGNOSIS — Z3A26 26 weeks gestation of pregnancy: Secondary | ICD-10-CM

## 2019-10-07 DIAGNOSIS — O0992 Supervision of high risk pregnancy, unspecified, second trimester: Secondary | ICD-10-CM

## 2019-10-07 DIAGNOSIS — Z331 Pregnant state, incidental: Secondary | ICD-10-CM

## 2019-10-07 DIAGNOSIS — Z23 Encounter for immunization: Secondary | ICD-10-CM | POA: Diagnosis not present

## 2019-10-07 DIAGNOSIS — O10919 Unspecified pre-existing hypertension complicating pregnancy, unspecified trimester: Secondary | ICD-10-CM

## 2019-10-07 DIAGNOSIS — O09522 Supervision of elderly multigravida, second trimester: Secondary | ICD-10-CM

## 2019-10-07 DIAGNOSIS — Z1389 Encounter for screening for other disorder: Secondary | ICD-10-CM

## 2019-10-07 LAB — POCT URINALYSIS DIPSTICK OB
Blood, UA: NEGATIVE
Glucose, UA: NEGATIVE
Ketones, UA: NEGATIVE
Leukocytes, UA: NEGATIVE
Nitrite, UA: NEGATIVE
POC,PROTEIN,UA: NEGATIVE

## 2019-10-07 NOTE — Patient Instructions (Signed)
[name], I greatly value your feedback.  If you receive a survey following your visit with [place] today, we appreciate you taking the time to fill it out.  Thanks, Knute Neu, CNM, WHNP-BC   [facility] ([place], [place] [address]) Entrance C, located off of [address] parking  Go to [email or web address] to register for FREE online childbirth classes   Call the office [phone]) or go to [facility] if:  You begin to have strong, frequent contractions  Your water breaks.  Sometimes it is a big gush of fluid, sometimes it is just a trickle that keeps getting your panties wet or running down your legs  You have vaginal bleeding.  It is normal to have a small amount of spotting if your cervix was checked.   You don't feel your baby moving like normal.  If you don't, get you something to eat and drink and lay down and focus on feeling your baby move.  You should feel at least 10 movements in 2 hours.  If you don't, you should call the office or go to [facility].    Tdap Vaccine  It is recommended that you get the Tdap vaccine during the third trimester of EACH pregnancy to help protect your baby from getting pertussis (whooping cough)  27-36 weeks is the BEST time to do this so that you can pass the protection on to your baby. During pregnancy is better than after pregnancy, but if you are unable to get it during pregnancy it will be offered at the hospital.   You can get this vaccine with [place], at the health department, your family doctor, or some local pharmacies  Everyone who will be around your baby should also be up-to-date on their vaccines before the baby comes. Adults (who are not pregnant) only need 1 dose of Tdap during adulthood.    Pediatricians/Family Doctors:  [facility] [phone]                 [place] [phone] (usually not accepting new patients unless you have family there already, you are always welcome to call and ask)       [facility] Department [phone]       [facility] Pediatricians/Family Doctors:   [facility]: [phone]  [facility]: [phone]  [facility]: [place] Doctors:   [facility]: [facility]: [place]:  [place]: [phone]   Home Blood Pressure Monitoring for Patients   Your provider has recommended that you check your blood pressure (BP) at least once a week at home. If you do not have a blood pressure cuff at home, one will be provided for you. Contact your provider if you have not received your monitor within 1 week.   Helpful Tips for Accurate Home Blood Pressure Checks  . Don't smoke, exercise, or drink caffeine 30 minutes before checking your BP . Use the restroom before checking your BP (a full bladder can raise your pressure) . Relax in a comfortable upright chair . Feet on the ground . Left arm resting comfortably on a flat surface at the level of your heart . Legs uncrossed . Back supported . Sit quietly and don't talk . Place the cuff on your  bare arm . Adjust snuggly, so that only two fingertips can fit between your skin and the top of the cuff . Check 2 readings separated by at least one minute . Keep a log of your BP readings . For a visual, please reference this diagram: http://ccnc.care/bpdiagram  Provider Name: [facility]     Phone: [phone]  Zone 1: ALL CLEAR  Continue to monitor your symptoms:  . BP reading is less than 140 (top number) or less than 90 (bottom number)  . No right upper stomach pain . No headaches or seeing spots . No feeling nauseated or throwing up . No swelling in face and hands  Zone 2: CAUTION Call  your doctor's office for any of the following:  . BP reading is greater than 140 (top number) or greater than 90 (bottom number)  . Stomach pain under your ribs in the middle or right side . Headaches or seeing spots . Feeling nauseated or throwing up . Swelling in face and hands  Zone 3: EMERGENCY  Seek immediate medical care if you have any of the following:  . BP reading is greater than160 (top number) or greater than 110 (bottom number) . Severe headaches not improving with Tylenol . Serious difficulty catching your breath . Any worsening symptoms from Zone 2   Third Trimester of Pregnancy The third trimester is from week 29 through week 42, months 7 through 9. The third trimester is a time when the fetus is growing rapidly. At the end of the ninth month, the fetus is about 20 inches in length and weighs 6-10 pounds.  BODY CHANGES Your body goes through many changes during pregnancy. The changes vary from woman to woman.   Your weight will continue to increase. You can expect to gain 25-35 pounds (11-16 kg) by the end of the pregnancy.  You may begin to get stretch marks on your hips, abdomen, and breasts.  You may urinate more often because the fetus is moving lower into your pelvis and pressing on your bladder.  You may develop or continue to have heartburn as a result of your pregnancy.  You may develop constipation because certain hormones are causing the muscles that push waste through your intestines to slow down.  You may develop hemorrhoids or swollen, bulging veins (varicose veins).  You may have pelvic pain because of the weight gain and pregnancy hormones relaxing your joints between the bones in your pelvis. Backaches may result from overexertion of the muscles supporting your posture.  You may have changes in your hair. These can include thickening of your hair, rapid growth, and changes in texture. Some women also have hair loss during or after pregnancy, or hair  that feels dry or thin. Your hair will most likely return to normal after your baby is born.  Your breasts will continue to grow and be tender. A yellow discharge may leak from your breasts called colostrum.  Your belly button may stick out.  You may feel short of breath because of your expanding uterus.  You may notice the fetus "dropping," or moving lower in your abdomen.  You may have a bloody mucus discharge. This usually occurs a few days to a week before labor begins.  Your cervix becomes thin and soft (effaced) near your due date. WHAT TO EXPECT AT YOUR PRENATAL EXAMS  You will have prenatal exams every 2 weeks until week 36. Then, you will have weekly prenatal exams. During a routine prenatal visit:  You will be weighed to make sure you and the fetus are growing normally.  Your blood pressure is taken.  Your abdomen will be measured to track your baby's growth.  The fetal heartbeat will be listened to.  Any test results from the previous visit will be discussed.  You may have a cervical check near your due date to see if you have effaced. At around 36 weeks, your caregiver will check your cervix. At the same time, your caregiver will also perform a test on the secretions of the vaginal tissue. This test is to determine if a type of bacteria, Group B streptococcus, is present. Your caregiver will explain this further. Your caregiver may ask you:  What your birth plan is.  How you are feeling.  If you are feeling the baby move.  If you have had any abnormal symptoms, such as leaking fluid, bleeding, severe headaches, or abdominal cramping.  If you have any questions. Other tests or screenings that may be performed during your third trimester include:  Blood tests that check for low iron levels (anemia).  Fetal testing to check the health, activity level, and growth of the fetus. Testing is done if you have certain medical conditions or if there are problems during the  pregnancy. FALSE LABOR You may feel small, irregular contractions that eventually go away. These are called Braxton Hicks contractions, or false labor. Contractions may last for hours, days, or even weeks before true labor sets in. If contractions come at regular intervals, intensify, or become painful, it is best to be seen by your caregiver.  SIGNS OF LABOR   Menstrual-like cramps.  Contractions that are 5 minutes apart or less.  Contractions that start on the top of the uterus and spread down to the lower abdomen and back.  A sense of increased pelvic pressure or back pain.  A watery or bloody mucus discharge that comes from the vagina. If you have any of these signs before the 37th week of pregnancy, call your caregiver right away. You need to go to the hospital to get checked immediately. HOME CARE INSTRUCTIONS   Avoid all smoking, herbs, alcohol, and unprescribed drugs. These chemicals affect the formation and growth of the baby.  Follow your caregiver's instructions regarding medicine use. There are medicines that are either safe or unsafe to take during pregnancy.  Exercise only as directed by your caregiver. Experiencing uterine cramps is a good sign to stop exercising.  Continue to eat regular, healthy meals.  Wear a good support bra for breast tenderness.  Do not use hot tubs, steam rooms, or saunas.  Wear your seat belt at all times when driving.  Avoid raw meat, uncooked cheese, cat litter boxes, and soil used by cats. These carry germs that can cause birth defects in the baby.  Take your prenatal vitamins.  Try taking a stool softener (if your caregiver approves) if you develop constipation. Eat more high-fiber foods, such as fresh vegetables or fruit and whole grains. Drink plenty of fluids to keep your urine clear or pale yellow.  Take warm sitz baths to soothe any pain or discomfort caused by hemorrhoids. Use hemorrhoid cream if your caregiver approves.  If you  develop varicose veins, wear support hose. Elevate your feet for 15 minutes, 3-4 times a day. Limit salt in your diet.  Avoid heavy lifting, wear low heal shoes, and practice good posture.  Rest a lot with your legs elevated if you have leg cramps or low  back pain.  Visit your dentist if you have not gone during your pregnancy. Use a soft toothbrush to brush your teeth and be gentle when you floss.  A sexual relationship may be continued unless your caregiver directs you otherwise.  Do not travel far distances unless it is absolutely necessary and only with the approval of your caregiver.  Take prenatal classes to understand, practice, and ask questions about the labor and delivery.  Make a trial run to the hospital.  Pack your hospital bag.  Prepare the baby's nursery.  Continue to go to all your prenatal visits as directed by your caregiver. SEEK MEDICAL CARE IF:  You are unsure if you are in labor or if your water has broken.  You have dizziness.  You have mild pelvic cramps, pelvic pressure, or nagging pain in your abdominal area.  You have persistent nausea, vomiting, or diarrhea.  You have a bad smelling vaginal discharge.  You have pain with urination. SEEK IMMEDIATE MEDICAL CARE IF:   You have a fever.  You are leaking fluid from your vagina.  You have spotting or bleeding from your vagina.  You have severe abdominal cramping or pain.  You have rapid weight loss or gain.  You have shortness of breath with chest pain.  You notice sudden or extreme swelling of your face, hands, ankles, feet, or legs.  You have not felt your baby move in over an hour.  You have severe headaches that do not go away with medicine.  You have vision changes. Document Released: 04/10/2001 Document Revised: 04/21/2013 Document Reviewed: 06/17/2012 [facility] Patient Information 2015 [place], [place]. This information is not intended to replace advice given to you by your health  care provider. Make sure you discuss any questions you have with your health care provider.

## 2019-10-07 NOTE — Progress Notes (Signed)
HIGH-RISK PREGNANCY VISIT Patient name: [name] MRN [id number]  Date of birth: 05-16-1980 Chief Complaint:   Routine Prenatal Visit (PN2)  History of Present Illness:   [name] is a 39 y.o. G51P1001 female at [redacted]w[redacted]d with an Estimated Date of Delivery: 01/08/20 being seen today for ongoing management of a high-risk pregnancy complicated by chronic hypertension currently on Labetalol 200mg  BID.  Today she reports pubic bone pain.  Depression screen [facility] 2/9 10/07/2019 07/01/2019 02/20/2019 10/23/2018 04/10/2018  Decreased Interest 0 0 0 0 0  Down, Depressed, Hopeless 0 0 0 0 0  PHQ - 2 Score 0 0 0 0 0  Altered sleeping 0 0 3 - -  Tired, decreased energy 1 0 1 - -  Change in appetite 1 1 0 - -  Feeling bad or failure about yourself  0 0 0 - -  Trouble concentrating 0 0 0 - -  Moving slowly or fidgety/restless 0 0 0 - -  Suicidal thoughts 0 0 0 - -  PHQ-9 Score 2 1 4  - -  Difficult doing work/chores - Not difficult at all Not difficult at all - -  Some recent data might be hidden    Contractions: Not present. Vag. Bleeding: None.  Movement: Present. denies leaking of fluid.  Review of Systems:   Pertinent items are noted in HPI Denies abnormal vaginal discharge w/ itching/odor/irritation, headaches, visual changes, shortness of breath, chest pain, abdominal pain, severe nausea/vomiting, or problems with urination or bowel movements unless otherwise stated above. Pertinent History Reviewed:  Reviewed past medical,surgical, social, obstetrical and family history.  Reviewed problem list, medications and allergies. Physical Assessment:   Vitals:   10/07/19 0915  BP: 136/80  Pulse: 91  Weight: (!) 302 lb 3.2 oz (137.1 kg)  Body mass index is 50.29 kg/m.           Physical Examination:   General appearance: alert, well appearing, and in no distress  Mental status: alert, oriented to person, place, and time  Skin: warm & dry   Extremities: Edema: Trace      Cardiovascular: normal heart rate noted  Respiratory: normal respiratory effort, no distress  Abdomen: gravid, soft, non-tender  Pelvic: Cervical exam deferred         Fetal Status: Fetal Heart Rate (bpm): 135 Fundal Height: 28 cm Movement: Present    Fetal Surveillance Testing today: doppler   Chaperone: n/a    Results for orders placed or performed in visit on 10/07/19 (from the past 24 hour(s))  POC Urinalysis Dipstick OB   Collection Time: 10/07/19  9:20 AM  Result Value Ref Range   Color, UA     Clarity, UA     Glucose, UA Negative Negative   Bilirubin, UA     Ketones, UA n    Spec Grav, UA     Blood, UA neg    pH, UA     POC,PROTEIN,UA Negative Negative, Trace, Small (1+), Moderate (2+), Large (3+), 4+   Urobilinogen, UA     Nitrite, UA neg    Leukocytes, UA Negative Negative   Appearance     Odor      Assessment & Plan:  1) High-risk pregnancy G2P1001 at [redacted]w[redacted]d with an Estimated Date of Delivery: 01/08/20   2) CHTN, stable on Labetalol 200mg  BID, ASA, reviewed pre-e s/s  3) AMA  4) Wants BTL> reviewed risks/benefits, LARCs just as effective, consent signed today   Meds: No orders of the defined  types were placed in this encounter.   Labs/procedures today: pn2, tdap  Treatment Plan:  Growth u/s q4wks    2x/wk testing nst/sono @ 32wks     Deliver 38-39wks (37wks or prn if poor control)____   Reviewed: Preterm labor symptoms and general obstetric precautions including but not limited to vaginal bleeding, contractions, leaking of fluid and fetal movement were reviewed in detail with the patient.  All questions were answered.   Follow-up: Return for 1st available efw/afi u/s w/ MFM, then 4wks HROB w/ MD/CNM in office w/ EFW/AFI u/s here, Sign BTL .  Orders Placed This Encounter  Procedures  . POC Urinalysis Dipstick OB   Roma Schanz CNM, [facility] 10/07/2019 9:46 AM

## 2019-10-08 ENCOUNTER — Ambulatory Visit: Payer: BC Managed Care – PPO | Attending: Women's Health

## 2019-10-08 ENCOUNTER — Other Ambulatory Visit: Payer: Self-pay

## 2019-10-08 ENCOUNTER — Other Ambulatory Visit: Payer: Self-pay | Admitting: Women's Health

## 2019-10-08 DIAGNOSIS — Z3A26 26 weeks gestation of pregnancy: Secondary | ICD-10-CM

## 2019-10-08 DIAGNOSIS — O10919 Unspecified pre-existing hypertension complicating pregnancy, unspecified trimester: Secondary | ICD-10-CM

## 2019-10-08 DIAGNOSIS — O10012 Pre-existing essential hypertension complicating pregnancy, second trimester: Secondary | ICD-10-CM | POA: Diagnosis not present

## 2019-10-08 DIAGNOSIS — O24419 Gestational diabetes mellitus in pregnancy, unspecified control: Secondary | ICD-10-CM

## 2019-10-08 DIAGNOSIS — O99212 Obesity complicating pregnancy, second trimester: Secondary | ICD-10-CM | POA: Diagnosis not present

## 2019-10-08 DIAGNOSIS — O0993 Supervision of high risk pregnancy, unspecified, third trimester: Secondary | ICD-10-CM | POA: Diagnosis not present

## 2019-10-08 DIAGNOSIS — O09522 Supervision of elderly multigravida, second trimester: Secondary | ICD-10-CM | POA: Diagnosis not present

## 2019-10-08 LAB — CBC
Hematocrit: 36.2 % (ref 34.0–46.6)
Hemoglobin: 12.2 g/dL (ref 11.1–15.9)
MCH: 30.6 pg (ref 26.6–33.0)
MCHC: 33.7 g/dL (ref 31.5–35.7)
MCV: 91 fL (ref 79–97)
Platelets: 314 10*3/uL (ref 150–450)
RBC: 3.99 x10E6/uL (ref 3.77–5.28)
RDW: 12.4 % (ref 11.7–15.4)
WBC: 12.3 10*3/uL — ABNORMAL HIGH (ref 3.4–10.8)

## 2019-10-08 LAB — HIV ANTIBODY (ROUTINE TESTING W REFLEX): HIV Screen 4th Generation wRfx: NONREACTIVE

## 2019-10-08 LAB — ANTIBODY SCREEN: Antibody Screen: NEGATIVE

## 2019-10-08 LAB — GLUCOSE TOLERANCE, 2 HOURS W/ 1HR
Glucose, 1 hour: 190 mg/dL — ABNORMAL HIGH (ref 65–179)
Glucose, 2 hour: 136 mg/dL (ref 65–152)
Glucose, Fasting: 88 mg/dL (ref 65–91)

## 2019-10-08 LAB — RPR: RPR Ser Ql: NONREACTIVE

## 2019-10-09 ENCOUNTER — Encounter: Payer: Self-pay | Admitting: Women's Health

## 2019-10-12 ENCOUNTER — Encounter: Payer: Self-pay | Admitting: *Deleted

## 2019-10-12 ENCOUNTER — Other Ambulatory Visit: Payer: Self-pay | Admitting: *Deleted

## 2019-10-12 DIAGNOSIS — O24410 Gestational diabetes mellitus in pregnancy, diet controlled: Secondary | ICD-10-CM

## 2019-10-12 DIAGNOSIS — Z3A27 27 weeks gestation of pregnancy: Secondary | ICD-10-CM

## 2019-10-12 DIAGNOSIS — O0993 Supervision of high risk pregnancy, unspecified, third trimester: Secondary | ICD-10-CM

## 2019-10-12 MED ORDER — ACCU-CHEK GUIDE ME W/DEVICE KIT: 1.0000 | PACK | Freq: Four times a day (QID) | 0 refills | Status: DC

## 2019-10-12 MED ORDER — ACCU-CHEK GUIDE VI STRP: ORAL_STRIP | 12 refills | Status: DC

## 2019-10-12 MED ORDER — ACCU-CHEK SOFTCLIX LANCETS MISC: 12 refills | Status: DC

## 2019-10-26 ENCOUNTER — Other Ambulatory Visit: Payer: Self-pay

## 2019-10-26 ENCOUNTER — Encounter: Payer: Medicaid Other | Attending: Family Medicine | Admitting: Registered"

## 2019-10-26 ENCOUNTER — Encounter: Payer: Self-pay | Admitting: Registered"

## 2019-10-26 DIAGNOSIS — O24410 Gestational diabetes mellitus in pregnancy, diet controlled: Secondary | ICD-10-CM | POA: Insufficient documentation

## 2019-10-26 NOTE — Progress Notes (Signed)
Patient was seen on 10/26/19 for Gestational Diabetes self-management class at the [facility]. The following learning objectives were met by the patient during this course:   States the definition of Gestational Diabetes  States why dietary management is important in controlling blood glucose  Describes the effects each nutrient has on blood glucose levels  Demonstrates ability to create a balanced meal plan  Demonstrates carbohydrate counting   States when to check blood glucose levels  Demonstrates proper blood glucose monitoring techniques  States the effect of stress and exercise on blood glucose levels  States the importance of limiting caffeine and abstaining from alcohol and smoking  Blood glucose monitor given: none, has meter and is checking. Per patient all readings are WNL  Patient instructed to monitor glucose levels: FBS: 60 - <95; 1 hour: <140; 2 hour: <120  Patient received handouts:  Nutrition Diabetes and Pregnancy, including carb counting list  Patient will be seen for follow-up as needed.

## 2019-11-03 ENCOUNTER — Other Ambulatory Visit: Payer: Self-pay | Admitting: Women's Health

## 2019-11-03 DIAGNOSIS — O10919 Unspecified pre-existing hypertension complicating pregnancy, unspecified trimester: Secondary | ICD-10-CM

## 2019-11-03 DIAGNOSIS — O24410 Gestational diabetes mellitus in pregnancy, diet controlled: Secondary | ICD-10-CM

## 2019-11-03 DIAGNOSIS — O09522 Supervision of elderly multigravida, second trimester: Secondary | ICD-10-CM

## 2019-11-04 ENCOUNTER — Encounter: Payer: Self-pay | Admitting: Student

## 2019-11-04 ENCOUNTER — Ambulatory Visit (INDEPENDENT_AMBULATORY_CARE_PROVIDER_SITE_OTHER): Payer: Medicaid Other | Admitting: Student

## 2019-11-04 ENCOUNTER — Ambulatory Visit (INDEPENDENT_AMBULATORY_CARE_PROVIDER_SITE_OTHER): Payer: Medicaid Other

## 2019-11-04 VITALS — BP 137/87 | HR 96 | Wt 301.0 lb

## 2019-11-04 DIAGNOSIS — Z331 Pregnant state, incidental: Secondary | ICD-10-CM

## 2019-11-04 DIAGNOSIS — O24419 Gestational diabetes mellitus in pregnancy, unspecified control: Secondary | ICD-10-CM

## 2019-11-04 DIAGNOSIS — O09522 Supervision of elderly multigravida, second trimester: Secondary | ICD-10-CM

## 2019-11-04 DIAGNOSIS — O24410 Gestational diabetes mellitus in pregnancy, diet controlled: Secondary | ICD-10-CM

## 2019-11-04 DIAGNOSIS — R319 Hematuria, unspecified: Secondary | ICD-10-CM

## 2019-11-04 DIAGNOSIS — O99213 Obesity complicating pregnancy, third trimester: Secondary | ICD-10-CM

## 2019-11-04 DIAGNOSIS — Z3A30 30 weeks gestation of pregnancy: Secondary | ICD-10-CM

## 2019-11-04 DIAGNOSIS — O09523 Supervision of elderly multigravida, third trimester: Secondary | ICD-10-CM | POA: Diagnosis not present

## 2019-11-04 DIAGNOSIS — Z302 Encounter for sterilization: Secondary | ICD-10-CM

## 2019-11-04 DIAGNOSIS — E669 Obesity, unspecified: Secondary | ICD-10-CM

## 2019-11-04 DIAGNOSIS — O0993 Supervision of high risk pregnancy, unspecified, third trimester: Secondary | ICD-10-CM | POA: Diagnosis not present

## 2019-11-04 DIAGNOSIS — Z1389 Encounter for screening for other disorder: Secondary | ICD-10-CM

## 2019-11-04 DIAGNOSIS — O1213 Gestational proteinuria, third trimester: Secondary | ICD-10-CM

## 2019-11-04 DIAGNOSIS — O10919 Unspecified pre-existing hypertension complicating pregnancy, unspecified trimester: Secondary | ICD-10-CM

## 2019-11-04 DIAGNOSIS — O0992 Supervision of high risk pregnancy, unspecified, second trimester: Secondary | ICD-10-CM

## 2019-11-04 DIAGNOSIS — Z8759 Personal history of other complications of pregnancy, childbirth and the puerperium: Secondary | ICD-10-CM

## 2019-11-04 DIAGNOSIS — O10913 Unspecified pre-existing hypertension complicating pregnancy, third trimester: Secondary | ICD-10-CM

## 2019-11-04 LAB — POCT URINALYSIS DIPSTICK OB
Glucose, UA: NEGATIVE
Leukocytes, UA: NEGATIVE
Nitrite, UA: NEGATIVE

## 2019-11-04 NOTE — Progress Notes (Signed)
[place] 05+1 wks,cephalic,anterior placenta gr 2,cx 3.4 cm,fhr 140 bpm,afi 12.4 cm,EFW 1457 14%,AC 6.1%

## 2019-11-04 NOTE — Progress Notes (Signed)
   PRENATAL VISIT NOTE  Subjective:  [name] is a 39 y.o. G2P1001 at [redacted]w[redacted]d being seen today for ongoing prenatal care.  She is currently monitored for the following issues for this high-risk pregnancy and has Morbid obesity ([place]); Chronic low back pain; Hypertension; Asthma in adult; Insomnia; Dysplasia of cervix, unspecified; Polyclonal gammopathy; Glucose intolerance (impaired glucose tolerance); Knee sprain; Chronic breast pain; Fibroids, intramural; Plantar fasciitis; Cysts of both ovaries; Moderate persistent asthma, uncomplicated; Seasonal and perennial allergic rhinitis; High-risk pregnancy; Chronic hypertension in pregnancy; AMA (advanced maternal age) multigravida 34+; History of postpartum hemorrhage; Request for sterilization; and Gestational diabetes mellitus, class A1 on their problem list.  Patient reports no complaints. She denies  Burning with urination, signs of UTI. She denies headaches, blurry vision, floating spots. .Checking blood pressure daily, reports occasionally 140/90 but most of the time normal. She is checking her sugars twice a day, reports fastings are all below 90 and that they are "good" after meals.  s .  Contractions: Irregular. Vag. Bleeding: None.  Movement: Present. Denies leaking of fluid.   The following portions of the patient's history were reviewed and updated as appropriate: allergies, current medications, past family history, past medical history, past social history, past surgical history and problem list.   Objective:   Vitals:   11/04/19 0933  BP: 137/87  Pulse: 96  Weight: (!) 301 lb (136.5 kg)    Fetal Status: Fetal Heart Rate (bpm): 141 Fundal Height: 32 cm Movement: Present     General:  Alert, oriented and cooperative. Patient is in no acute distress.  Skin: Skin is warm and dry. No rash noted.   Cardiovascular: Normal heart rate noted  Respiratory: Normal respiratory effort, no problems with respiration noted  Abdomen:  Soft, gravid, appropriate for gestational age.  Pain/Pressure: Present     Pelvic: Cervical exam deferred        Extremities: Normal range of motion.  Edema: Trace  Mental Status: Normal mood and affect. Normal behavior. Normal judgment and thought content.   Assessment and Plan:  Pregnancy: G2P1001 at [redacted]w[redacted]d 1. High-risk pregnancy in third trimester Start weekly testing at 32 weeks (in two weeks) -Reviewed warning signs and high blood pressure precautions.  -Patient will start checking BS 4 times a day; reviewed fasting and pp limits. Reviewed meter, fastings are normal; one elevated PP value but overall over the past 7 days has only checked twice a day.  -BPs in Baby[facility] are normal. Continue medications as prescribed.   2. Screening for genitourinary condition - POC Urinalysis Dipstick OB  3. Pregnant state, incidental - POC Urinalysis Dipstick OB  4. [redacted] weeks gestation of pregnancy - POC Urinalysis Dipstick OB  5. Proteinuria affecting pregnancy in third trimester  - Protein / creatinine ratio, urine  6. Hematuria, unspecified type  - Urine Culture  Preterm labor symptoms and general obstetric precautions including but not limited to vaginal bleeding, contractions, leaking of fluid and fetal movement were reviewed in detail with the patient. Please refer to After Visit Summary for other counseling recommendations.   Return in about 2 weeks (around 11/18/2019), or needs weekly BPP or NST/AFI starting at 32 weeks and needs In person HROB in two weeks.  Future Appointments  Date Time Provider [place]  11/18/2019 10:15 AM Valentina Shaggy, MD [facility] None  11/18/2019 11:00 AM [facility] [place] [facility] None  11/18/2019 11:50 AM Jonnie Kind, MD [facility]    Mervyn Skeeters [place], [place]

## 2019-11-05 LAB — PROTEIN / CREATININE RATIO, URINE
Creatinine, Urine: 205.1 mg/dL
Protein, Ur: 23 mg/dL
Protein/Creat Ratio: 112 mg/g creat (ref 0–200)

## 2019-11-06 LAB — URINE CULTURE

## 2019-11-13 ENCOUNTER — Other Ambulatory Visit: Payer: Self-pay | Admitting: Advanced Practice Midwife

## 2019-11-17 ENCOUNTER — Other Ambulatory Visit: Payer: Self-pay | Admitting: Student

## 2019-11-17 DIAGNOSIS — O10919 Unspecified pre-existing hypertension complicating pregnancy, unspecified trimester: Secondary | ICD-10-CM

## 2019-11-18 ENCOUNTER — Encounter: Payer: Self-pay | Admitting: Obstetrics and Gynecology

## 2019-11-18 ENCOUNTER — Ambulatory Visit (INDEPENDENT_AMBULATORY_CARE_PROVIDER_SITE_OTHER): Payer: Medicaid Other

## 2019-11-18 ENCOUNTER — Ambulatory Visit (INDEPENDENT_AMBULATORY_CARE_PROVIDER_SITE_OTHER): Payer: Medicaid Other | Admitting: Obstetrics and Gynecology

## 2019-11-18 ENCOUNTER — Ambulatory Visit: Payer: Medicaid Other | Admitting: Allergy & Immunology

## 2019-11-18 VITALS — BP 124/84 | HR 98 | Wt 301.0 lb

## 2019-11-18 DIAGNOSIS — E119 Type 2 diabetes mellitus without complications: Secondary | ICD-10-CM

## 2019-11-18 DIAGNOSIS — Z302 Encounter for sterilization: Secondary | ICD-10-CM

## 2019-11-18 DIAGNOSIS — Z1389 Encounter for screening for other disorder: Secondary | ICD-10-CM

## 2019-11-18 DIAGNOSIS — O10913 Unspecified pre-existing hypertension complicating pregnancy, third trimester: Secondary | ICD-10-CM

## 2019-11-18 DIAGNOSIS — Z331 Pregnant state, incidental: Secondary | ICD-10-CM

## 2019-11-18 DIAGNOSIS — O0993 Supervision of high risk pregnancy, unspecified, third trimester: Secondary | ICD-10-CM | POA: Diagnosis not present

## 2019-11-18 DIAGNOSIS — Z3A32 32 weeks gestation of pregnancy: Secondary | ICD-10-CM

## 2019-11-18 DIAGNOSIS — O99213 Obesity complicating pregnancy, third trimester: Secondary | ICD-10-CM

## 2019-11-18 DIAGNOSIS — O10919 Unspecified pre-existing hypertension complicating pregnancy, unspecified trimester: Secondary | ICD-10-CM

## 2019-11-18 DIAGNOSIS — Z8759 Personal history of other complications of pregnancy, childbirth and the puerperium: Secondary | ICD-10-CM | POA: Diagnosis not present

## 2019-11-18 DIAGNOSIS — O24113 Pre-existing diabetes mellitus, type 2, in pregnancy, third trimester: Secondary | ICD-10-CM

## 2019-11-18 DIAGNOSIS — E669 Obesity, unspecified: Secondary | ICD-10-CM

## 2019-11-18 LAB — POCT URINALYSIS DIPSTICK OB
Blood, UA: NEGATIVE
Glucose, UA: NEGATIVE
Ketones, UA: NEGATIVE
Leukocytes, UA: NEGATIVE
Nitrite, UA: NEGATIVE

## 2019-11-18 NOTE — Progress Notes (Signed)
Patient ID: [name], female   DOB: [birth year], 39 y.o.   MRN: [id number]    [facility] PREGNANCY VISIT Patient name: [name] MRN [id number]  Date of birth: 06/03/1980 Chief Complaint:   Routine Prenatal Visit (BPP)  History of Present Illness:   [name] is a 40 y.o. G61P1001 female at [redacted]w[redacted]d with an Estimated Date of Delivery: 01/08/20 being seen today for ongoing management of a high-risk pregnancy complicated by [facility] on Labetalol 200 mg BID.  As well as A2 diabetes mellitus Today she reports no complaints. She checks her BP TID and sometimes it is normal and sometimes not. She notes that about twice a week, her BP goes above 140/90. Her fasting blood sugar yesterday was 92 and post-prandials were 122 and 142. Sugars on the 19th, 18th, and 16th were all good.  Contractions: Not present. Vag. Bleeding: None.  Movement: Present. denies leaking of fluid.  Review of Systems:   Pertinent items are noted in HPI Denies abnormal vaginal discharge w/ itching/odor/irritation, headaches, visual changes, shortness of breath, chest pain, abdominal pain, severe nausea/vomiting, or problems with urination or bowel movements unless otherwise stated above. Pertinent History Reviewed:  Reviewed past medical,surgical, social, obstetrical and family history.  Reviewed problem list, medications and allergies. Physical Assessment:   Vitals:   11/18/19 1159  BP: 124/84  Pulse: 98  Weight: (!) 301 lb (136.5 kg)  Body mass index is 50.09 kg/m.           Physical Examination:   General appearance: alert, well appearing, and in no distress  Mental status: alert, oriented to person, place, and time, normal mood, behavior, speech, dress, motor activity, and thought processes  Skin: warm & dry   Extremities: Edema: Trace    Cardiovascular: normal heart rate noted  Respiratory: normal respiratory effort, no distress  Abdomen: gravid, soft, non-tender  Pelvic: Cervical exam  deferred         Fetal Status:     Movement: Present    Fetal Surveillance Testing today: [place] 38+1 wks,cephalic,anterior placenta gr 2,AFI 13 cm,fhr 137 bpm,BPP 8/8,RI .66,.66,.71=81%,EFW 1684 g 6.7 % AC 1.6%  Results for orders placed or performed in visit on 11/18/19 (from the past 24 hour(s))  POC Urinalysis Dipstick OB   Collection Time: 11/18/19 12:01 PM  Result Value Ref Range   Color, UA     Clarity, UA     Glucose, UA Negative Negative   Bilirubin, UA     Ketones, UA neg    Spec Grav, UA     Blood, UA neg    pH, UA     POC,PROTEIN,UA Trace Negative, Trace, Small (1+), Moderate (2+), Large (3+), 4+   Urobilinogen, UA     Nitrite, UA neg    Leukocytes, UA Negative Negative   Appearance     Odor      Assessment & Plan:  1) High-risk pregnancy G2P1001 at [redacted]w[redacted]d with an Estimated Date of Delivery: 01/08/20   2) CHTN currently on Labetalol 200mg  BID 3) A2 diabetes mellitus 4.  Morbid obesity,Body mass index is 50.09 kg/m.   Meds: No orders of the defined types were placed in this encounter.  Labs/procedures today: none   Treatment Plan:  Twice weekly surveillance with alternating NST and BPP. Growth ultrasounds q 3 weeks.  Follow-up: Return in about 5 days (around 11/23/2019) for HROB, NST.  By signing my name below, I, De Burrs, attest that this documentation has been prepared under the direction  and in the presence of Jonnie Kind, MD. Electronically Signed: De Burrs, Medical Scribe. 11/18/19. 12:27 PM.  I personally performed the services described in this documentation, which was SCRIBED in my presence. The recorded information has been reviewed and considered accurate. It has been edited as necessary during review. Jonnie Kind, MD

## 2019-11-18 NOTE — Progress Notes (Signed)
[place] 93+8 wks,cephalic,anterior placenta gr 2,AFI 13 cm,fhr 137 bpm,BPP 8/8,RI .66,.66,.71=81%,EFW 1684 g 6.7 % AC 1.6%

## 2019-11-24 ENCOUNTER — Ambulatory Visit (INDEPENDENT_AMBULATORY_CARE_PROVIDER_SITE_OTHER): Payer: Medicaid Other | Admitting: Obstetrics & Gynecology

## 2019-11-24 ENCOUNTER — Encounter: Payer: Self-pay | Admitting: Obstetrics & Gynecology

## 2019-11-24 VITALS — BP 132/81 | HR 106 | Wt 300.0 lb

## 2019-11-24 DIAGNOSIS — O10913 Unspecified pre-existing hypertension complicating pregnancy, third trimester: Secondary | ICD-10-CM

## 2019-11-24 DIAGNOSIS — Z3A33 33 weeks gestation of pregnancy: Secondary | ICD-10-CM

## 2019-11-24 DIAGNOSIS — Z1389 Encounter for screening for other disorder: Secondary | ICD-10-CM

## 2019-11-24 DIAGNOSIS — Z331 Pregnant state, incidental: Secondary | ICD-10-CM

## 2019-11-24 DIAGNOSIS — O24410 Gestational diabetes mellitus in pregnancy, diet controlled: Secondary | ICD-10-CM

## 2019-11-24 DIAGNOSIS — Z3483 Encounter for supervision of other normal pregnancy, third trimester: Secondary | ICD-10-CM

## 2019-11-24 LAB — POCT URINALYSIS DIPSTICK OB
Blood, UA: NEGATIVE
Glucose, UA: NEGATIVE
Ketones, UA: NEGATIVE
Leukocytes, UA: NEGATIVE
Nitrite, UA: NEGATIVE
POC,PROTEIN,UA: NEGATIVE

## 2019-11-24 NOTE — Progress Notes (Signed)
HIGH-RISK PREGNANCY VISIT Patient name: [name] MRN [id number]  Date of birth: 01/28/1981 Chief Complaint:   Routine Prenatal Visit (NST)  History of Present Illness:   [name] is a 39 y.o. G43P1001 female at [redacted]w[redacted]d with an Estimated Date of Delivery: 01/08/20 being seen today for ongoing management of a high-risk pregnancy complicated by [facility], K5LZ and AMA.  Today she reports no complaints.  Depression screen [facility] 2/9 10/26/2019 10/07/2019 07/01/2019 02/20/2019 10/23/2018  Decreased Interest 0 0 0 0 0  Down, Depressed, Hopeless 0 0 0 0 0  PHQ - 2 Score 0 0 0 0 0  Altered sleeping - 0 0 3 -  Tired, decreased energy - 1 0 1 -  Change in appetite - 1 1 0 -  Feeling bad or failure about yourself  - 0 0 0 -  Trouble concentrating - 0 0 0 -  Moving slowly or fidgety/restless - 0 0 0 -  Suicidal thoughts - 0 0 0 -  PHQ-9 Score - 2 1 4  -  Difficult doing work/chores - - Not difficult at all Not difficult at all -  Some recent data might be hidden    Contractions: Not present. Vag. Bleeding: None.  Movement: Present. denies leaking of fluid.  Review of Systems:   Pertinent items are noted in HPI Denies abnormal vaginal discharge w/ itching/odor/irritation, headaches, visual changes, shortness of breath, chest pain, abdominal pain, severe nausea/vomiting, or problems with urination or bowel movements unless otherwise stated above. Pertinent History Reviewed:  Reviewed past medical,surgical, social, obstetrical and family history.  Reviewed problem list, medications and allergies. Physical Assessment:   Vitals:   11/24/19 1037  BP: (!) 132/81  Pulse: (!) 106  Weight: (!) 300 lb (136.1 kg)  Body mass index is 49.92 kg/m.           Physical Examination:   General appearance: alert, well appearing, and in no distress  Mental status: alert, oriented to person, place, and time  Skin: warm & dry   Extremities: Edema: Trace    Cardiovascular: normal heart rate  noted  Respiratory: normal respiratory effort, no distress  Abdomen: gravid, soft, non-tender  Pelvic: Cervical exam deferred         Fetal Status:     Movement: Present    Fetal Surveillance Testing today: NST   Chaperone: n/a    Results for orders placed or performed in visit on 11/24/19 (from the past 24 hour(s))  POC Urinalysis Dipstick OB   Collection Time: 11/24/19 10:39 AM  Result Value Ref Range   Color, UA     Clarity, UA     Glucose, UA Negative Negative   Bilirubin, UA     Ketones, UA neg    Spec Grav, UA     Blood, UA neg    pH, UA     POC,PROTEIN,UA Negative Negative, Trace, Small (1+), Moderate (2+), Large (3+), 4+   Urobilinogen, UA     Nitrite, UA neg    Leukocytes, UA Negative Negative   Appearance     Odor      Assessment & Plan:  1) High-risk pregnancy G2P1001 at [redacted]w[redacted]d with an Estimated Date of Delivery: 01/08/20   2) CHTN, stable, on labetalol 200 BID, good control  3) A1DM, stable, good control  Meds: No orders of the defined types were placed in this encounter.   Labs/procedures today: NST  [name] is at [redacted]w[redacted]d Estimated Date of Delivery: 01/08/20  NST being performed due to [facility]  Today the NST is Reactive  Fetal Monitoring:  Baseline: 140s bpm, Variability: Good {> 6 bpm), Accelerations: Reactive and Decelerations: Absent   reactive  The accelerations are >15 bpm and more than 2 in 20 minutes  Final diagnosis:  Reactive NST  Mary Buff, MD     Treatment Plan:  Twice weekly surveillance, sonogram alternating with NST, induction at 39 weeks or as clinically indicated   Reviewed: Preterm labor symptoms and general obstetric precautions including but not limited to vaginal bleeding, contractions, leaking of fluid and fetal movement were reviewed in detail with the patient.  All questions were answered. Has home bp cuff. Rx faxed to . Check bp weekly, let us know if >140/90.   Follow-up: Return in about 3 days (around  11/27/2019) for BPP/sono, HROB.  Orders Placed This Encounter  Procedures  . POC Urinalysis Dipstick OB   [name]  11/24/2019 11:34 AM

## 2019-11-26 ENCOUNTER — Other Ambulatory Visit: Payer: Self-pay | Admitting: Obstetrics & Gynecology

## 2019-11-26 DIAGNOSIS — O24410 Gestational diabetes mellitus in pregnancy, diet controlled: Secondary | ICD-10-CM

## 2019-11-26 DIAGNOSIS — O09523 Supervision of elderly multigravida, third trimester: Secondary | ICD-10-CM

## 2019-11-26 DIAGNOSIS — O10919 Unspecified pre-existing hypertension complicating pregnancy, unspecified trimester: Secondary | ICD-10-CM

## 2019-11-27 ENCOUNTER — Encounter: Payer: Self-pay | Admitting: Obstetrics & Gynecology

## 2019-11-27 ENCOUNTER — Ambulatory Visit (INDEPENDENT_AMBULATORY_CARE_PROVIDER_SITE_OTHER): Payer: Medicaid Other | Admitting: Obstetrics & Gynecology

## 2019-11-27 ENCOUNTER — Ambulatory Visit (INDEPENDENT_AMBULATORY_CARE_PROVIDER_SITE_OTHER): Payer: Medicaid Other

## 2019-11-27 ENCOUNTER — Other Ambulatory Visit: Payer: Self-pay

## 2019-11-27 VITALS — BP 133/83 | HR 87 | Wt 300.0 lb

## 2019-11-27 DIAGNOSIS — O10919 Unspecified pre-existing hypertension complicating pregnancy, unspecified trimester: Secondary | ICD-10-CM

## 2019-11-27 DIAGNOSIS — O24410 Gestational diabetes mellitus in pregnancy, diet controlled: Secondary | ICD-10-CM

## 2019-11-27 DIAGNOSIS — Z3A34 34 weeks gestation of pregnancy: Secondary | ICD-10-CM

## 2019-11-27 DIAGNOSIS — O0993 Supervision of high risk pregnancy, unspecified, third trimester: Secondary | ICD-10-CM

## 2019-11-27 DIAGNOSIS — Z3483 Encounter for supervision of other normal pregnancy, third trimester: Secondary | ICD-10-CM

## 2019-11-27 DIAGNOSIS — Z8759 Personal history of other complications of pregnancy, childbirth and the puerperium: Secondary | ICD-10-CM

## 2019-11-27 DIAGNOSIS — Z331 Pregnant state, incidental: Secondary | ICD-10-CM

## 2019-11-27 DIAGNOSIS — O09523 Supervision of elderly multigravida, third trimester: Secondary | ICD-10-CM | POA: Diagnosis not present

## 2019-11-27 DIAGNOSIS — Z1389 Encounter for screening for other disorder: Secondary | ICD-10-CM

## 2019-11-27 DIAGNOSIS — O24419 Gestational diabetes mellitus in pregnancy, unspecified control: Secondary | ICD-10-CM

## 2019-11-27 DIAGNOSIS — Z302 Encounter for sterilization: Secondary | ICD-10-CM

## 2019-11-27 DIAGNOSIS — O10913 Unspecified pre-existing hypertension complicating pregnancy, third trimester: Secondary | ICD-10-CM

## 2019-11-27 LAB — POCT URINALYSIS DIPSTICK OB
Blood, UA: NEGATIVE
Glucose, UA: NEGATIVE
Ketones, UA: NEGATIVE
Leukocytes, UA: NEGATIVE
Nitrite, UA: NEGATIVE

## 2019-11-27 NOTE — Progress Notes (Signed)
HIGH-RISK PREGNANCY VISIT Patient name: [name] MRN [id number]  Date of birth: 08-10-1980 Chief Complaint:   Routine Prenatal Visit (BPP)  History of Present Illness:   [name] is a 39 y.o. G4P1001 female at [redacted]w[redacted]d with an Estimated Date of Delivery: 01/08/20 being seen today for ongoing management of a high-risk pregnancy complicated by chronic hypertension currently on labetalol 200 BID, A1 DM  Today she reports no complaints.  Depression screen [facility] 2/9 10/26/2019 10/07/2019 07/01/2019 02/20/2019 10/23/2018  Decreased Interest 0 0 0 0 0  Down, Depressed, Hopeless 0 0 0 0 0  PHQ - 2 Score 0 0 0 0 0  Altered sleeping - 0 0 3 -  Tired, decreased energy - 1 0 1 -  Change in appetite - 1 1 0 -  Feeling bad or failure about yourself  - 0 0 0 -  Trouble concentrating - 0 0 0 -  Moving slowly or fidgety/restless - 0 0 0 -  Suicidal thoughts - 0 0 0 -  PHQ-9 Score - 2 1 4  -  Difficult doing work/chores - - Not difficult at all Not difficult at all -  Some recent data might be hidden    Contractions: Not present. Vag. Bleeding: None.  Movement: Present. denies leaking of fluid.  Review of Systems:   Pertinent items are noted in HPI Denies abnormal vaginal discharge w/ itching/odor/irritation, headaches, visual changes, shortness of breath, chest pain, abdominal pain, severe nausea/vomiting, or problems with urination or bowel movements unless otherwise stated above. Pertinent History Reviewed:  Reviewed past medical,surgical, social, obstetrical and family history.  Reviewed problem list, medications and allergies. Physical Assessment:   Vitals:   11/27/19 1036  BP: (!) 133/83  Pulse: 87  Weight: (!) 300 lb (136.1 kg)  Body mass index is 49.92 kg/m.           Physical Examination:   General appearance: alert, well appearing, and in no distress  Mental status: alert, oriented to person, place, and time  Skin: warm & dry   Extremities: Edema: Trace      Cardiovascular: normal heart rate noted  Respiratory: normal respiratory effort, no distress  Abdomen: gravid, soft, non-tender  Pelvic: Cervical exam deferred         Fetal Status:     Movement: Present    Fetal Surveillance Testing today: BPP 8/8   Chaperone: n/a    Results for orders placed or performed in visit on 11/27/19 (from the past 24 hour(s))  POC Urinalysis Dipstick OB   Collection Time: 11/27/19 10:42 AM  Result Value Ref Range   Color, UA     Clarity, UA     Glucose, UA Negative Negative   Bilirubin, UA     Ketones, UA neg    Spec Grav, UA     Blood, UA neg    pH, UA     POC,PROTEIN,UA Trace Negative, Trace, Small (1+), Moderate (2+), Large (3+), 4+   Urobilinogen, UA     Nitrite, UA neg    Leukocytes, UA Negative Negative   Appearance     Odor      Assessment & Plan:  1) High-risk pregnancy G2P1001 at [redacted]w[redacted]d with an Estimated Date of Delivery: 01/08/20   2) CHTN, stable, labetalol 200 BID  3) Class A1 DM, stable, CBG are good  Meds: No orders of the defined types were placed in this encounter.   Labs/procedures today: BPP 8/8 with good Dopplers  Treatment Plan:  Twice weely surveillance IOL 39 weeks or as clinically indicated  Reviewed: Preterm labor symptoms and general obstetric precautions including but not limited to vaginal bleeding, contractions, leaking of fluid and fetal movement were reviewed in detail with the patient.  All questions were answered. Has home bp cuff. Rx faxed to . Check bp weekly, let us know if >140/90.   Follow-up: No follow-ups on file.  Orders Placed This Encounter  Procedures   POC Urinalysis Dipstick OB   Mertie Clause Cheryl Chay  11/27/2019 10:45 AM

## 2019-11-27 NOTE — Progress Notes (Signed)
[place] 34 wks,cephalic,BPP 4/7,[id number] placenta gr 2,afi 13 cm,fhr 148 bpm,RI .62,.74,.59=76%

## 2019-11-30 ENCOUNTER — Other Ambulatory Visit: Payer: Self-pay | Admitting: Obstetrics & Gynecology

## 2019-11-30 DIAGNOSIS — O24410 Gestational diabetes mellitus in pregnancy, diet controlled: Secondary | ICD-10-CM

## 2019-11-30 DIAGNOSIS — O10919 Unspecified pre-existing hypertension complicating pregnancy, unspecified trimester: Secondary | ICD-10-CM

## 2019-12-01 ENCOUNTER — Ambulatory Visit (INDEPENDENT_AMBULATORY_CARE_PROVIDER_SITE_OTHER): Payer: Medicaid Other

## 2019-12-01 ENCOUNTER — Ambulatory Visit (INDEPENDENT_AMBULATORY_CARE_PROVIDER_SITE_OTHER): Payer: Medicaid Other | Admitting: Obstetrics & Gynecology

## 2019-12-01 ENCOUNTER — Other Ambulatory Visit: Payer: Medicaid Other

## 2019-12-01 ENCOUNTER — Encounter: Payer: Self-pay | Admitting: Obstetrics & Gynecology

## 2019-12-01 VITALS — BP 130/84 | HR 92 | Wt 300.0 lb

## 2019-12-01 DIAGNOSIS — O10919 Unspecified pre-existing hypertension complicating pregnancy, unspecified trimester: Secondary | ICD-10-CM | POA: Diagnosis not present

## 2019-12-01 DIAGNOSIS — Z1389 Encounter for screening for other disorder: Secondary | ICD-10-CM

## 2019-12-01 DIAGNOSIS — Z3A34 34 weeks gestation of pregnancy: Secondary | ICD-10-CM

## 2019-12-01 DIAGNOSIS — O24410 Gestational diabetes mellitus in pregnancy, diet controlled: Secondary | ICD-10-CM

## 2019-12-01 DIAGNOSIS — O0993 Supervision of high risk pregnancy, unspecified, third trimester: Secondary | ICD-10-CM | POA: Diagnosis not present

## 2019-12-01 DIAGNOSIS — Z331 Pregnant state, incidental: Secondary | ICD-10-CM

## 2019-12-01 LAB — POCT URINALYSIS DIPSTICK OB
Blood, UA: NEGATIVE
Glucose, UA: NEGATIVE
Ketones, UA: NEGATIVE
Leukocytes, UA: NEGATIVE
Nitrite, UA: NEGATIVE

## 2019-12-01 NOTE — Progress Notes (Signed)
[place] 34+4 wks.cephalic,fhr 352 bpm,BPP 4/8,LYHTMBPJ placenta gr 2,afi 12 cm,RI .70,.66,.68,.67=84%,limited view

## 2019-12-01 NOTE — Progress Notes (Signed)
HIGH-RISK PREGNANCY VISIT Patient name: [name] MRN [id number]  Date of birth: March 20, 1981 Chief Complaint:   High Risk Gestation ([place] today)  History of Present Illness:   [name] is a 39 y.o. G60P1001 female at [redacted]w[redacted]d with an Estimated Date of Delivery: 01/08/20 being seen today for ongoing management of a high-risk pregnancy complicated by [facility] and Y7CW.  Today she reports no complaints.  Depression screen [facility] 2/9 10/26/2019 10/07/2019 07/01/2019 02/20/2019 10/23/2018  Decreased Interest 0 0 0 0 0  Down, Depressed, Hopeless 0 0 0 0 0  PHQ - 2 Score 0 0 0 0 0  Altered sleeping - 0 0 3 -  Tired, decreased energy - 1 0 1 -  Change in appetite - 1 1 0 -  Feeling bad or failure about yourself  - 0 0 0 -  Trouble concentrating - 0 0 0 -  Moving slowly or fidgety/restless - 0 0 0 -  Suicidal thoughts - 0 0 0 -  PHQ-9 Score - 2 1 4  -  Difficult doing work/chores - - Not difficult at all Not difficult at all -  Some recent data might be hidden    Contractions: Irregular. Vag. Bleeding: None.  Movement: Present. denies leaking of fluid.  Review of Systems:   Pertinent items are noted in HPI Denies abnormal vaginal discharge w/ itching/odor/irritation, headaches, visual changes, shortness of breath, chest pain, abdominal pain, severe nausea/vomiting, or problems with urination or bowel movements unless otherwise stated above. Pertinent History Reviewed:  Reviewed past medical,surgical, social, obstetrical and family history.  Reviewed problem list, medications and allergies. Physical Assessment:   Vitals:   12/01/19 1511  BP: 130/84  Pulse: 92  Weight: 300 lb (136.1 kg)  Body mass index is 49.92 kg/m.           Physical Examination:   General appearance: alert, well appearing, and in no distress  Mental status: alert, oriented to person, place, and time  Skin: warm & dry   Extremities: Edema: Trace    Cardiovascular: normal heart rate noted  Respiratory:  normal respiratory effort, no distress  Abdomen: gravid, soft, non-tender  Pelvic: Cervical exam deferred         Fetal Status:     Movement: Present    Fetal Surveillance Testing today: BPP 8/8   Chaperone: n/a    Results for orders placed or performed in visit on 12/01/19 (from the past 24 hour(s))  POC Urinalysis Dipstick OB   Collection Time: 12/01/19  3:07 PM  Result Value Ref Range   Color, UA     Clarity, UA     Glucose, UA Negative Negative   Bilirubin, UA     Ketones, UA neg    Spec Grav, UA     Blood, UA neg    pH, UA     POC,PROTEIN,UA Trace Negative, Trace, Small (1+), Moderate (2+), Large (3+), 4+   Urobilinogen, UA     Nitrite, UA neg    Leukocytes, UA Negative Negative   Appearance     Odor      Assessment & Plan:  1) High-risk pregnancy G2P1001 at [redacted]w[redacted]d with an Estimated Date of Delivery: 01/08/20   2) CHTN, stable, labetalol 200 BID  3) A1DM, stable  Meds: No orders of the defined types were placed in this encounter.   Labs/procedures today: BPP 8/8 w/normal Dopplers  Treatment Plan:  Twice weekly surveillance  Reviewed: Preterm labor symptoms and general obstetric precautions including  but not limited to vaginal bleeding, contractions, leaking of fluid and fetal movement were reviewed in detail with the patient.  All questions were answered. Has home bp cuff. Rx faxed to . Check bp weekly, let us know if >140/90.   Follow-up: Return in about 3 days (around 12/04/2019) for NST, Nurse only.  Orders Placed This Encounter  Procedures   POC Urinalysis Dipstick OB   Mertie Clause Leyton Magoon 12/01/2019 4:11 PM

## 2019-12-02 ENCOUNTER — Ambulatory Visit: Payer: Medicaid Other | Admitting: Allergy & Immunology

## 2019-12-04 ENCOUNTER — Ambulatory Visit (INDEPENDENT_AMBULATORY_CARE_PROVIDER_SITE_OTHER): Payer: Medicaid Other | Admitting: *Deleted

## 2019-12-04 VITALS — BP 124/80 | HR 101 | Wt 301.5 lb

## 2019-12-04 DIAGNOSIS — O0993 Supervision of high risk pregnancy, unspecified, third trimester: Secondary | ICD-10-CM

## 2019-12-04 DIAGNOSIS — O10919 Unspecified pre-existing hypertension complicating pregnancy, unspecified trimester: Secondary | ICD-10-CM

## 2019-12-04 DIAGNOSIS — Z3A35 35 weeks gestation of pregnancy: Secondary | ICD-10-CM | POA: Diagnosis not present

## 2019-12-04 DIAGNOSIS — Z331 Pregnant state, incidental: Secondary | ICD-10-CM | POA: Diagnosis not present

## 2019-12-04 DIAGNOSIS — Z302 Encounter for sterilization: Secondary | ICD-10-CM

## 2019-12-04 DIAGNOSIS — O09523 Supervision of elderly multigravida, third trimester: Secondary | ICD-10-CM

## 2019-12-04 DIAGNOSIS — Z1389 Encounter for screening for other disorder: Secondary | ICD-10-CM

## 2019-12-04 DIAGNOSIS — O24410 Gestational diabetes mellitus in pregnancy, diet controlled: Secondary | ICD-10-CM

## 2019-12-04 DIAGNOSIS — Z8759 Personal history of other complications of pregnancy, childbirth and the puerperium: Secondary | ICD-10-CM

## 2019-12-04 LAB — POCT URINALYSIS DIPSTICK OB
Blood, UA: NEGATIVE
Glucose, UA: NEGATIVE
Ketones, UA: NEGATIVE
Leukocytes, UA: NEGATIVE
Nitrite, UA: NEGATIVE

## 2019-12-04 NOTE — Progress Notes (Signed)
° °  NURSE VISIT- NST  SUBJECTIVE:  [name] is a 39 y.o. G42P1001 female at [redacted]w[redacted]d, here for a NST for pregnancy complicated by [facility].  She reports active fetal movement, contractions: irregular, vaginal bleeding: none, membranes: intact.   OBJECTIVE:  BP 124/80    Pulse (!) 101    Wt (!) 301 lb 8 oz (136.8 kg)    LMP 04/03/2019 (Exact Date)    BMI 50.17 kg/m   Appears well, no apparent distress  Results for orders placed or performed in visit on 12/04/19 (from the past 24 hour(s))  POC Urinalysis Dipstick OB   Collection Time: 12/04/19 10:03 AM  Result Value Ref Range   Color, UA     Clarity, UA     Glucose, UA Negative Negative   Bilirubin, UA     Ketones, UA neg    Spec Grav, UA     Blood, UA neg    pH, UA     POC,PROTEIN,UA Small (1+) Negative, Trace, Small (1+), Moderate (2+), Large (3+), 4+   Urobilinogen, UA     Nitrite, UA neg    Leukocytes, UA Negative Negative   Appearance     Odor      NST: FHR baseline 145 bpm, Variability: moderate, Accelerations:present, Decelerations:  Absent= Cat 1/Reactive Toco: none   ASSESSMENT: G2P1001 at [redacted]w[redacted]d with CHTN and A1DM NST reactive  PLAN: EFM strip reviewed by Knute Neu, CNM, [facility]   Recommendations: keep next appointment as scheduled    Levy Pupa  12/04/2019 10:52 AM   Chart reviewed for nurse visit. Agree with plan of care.  Roma Schanz, [place] 12/04/2019 1:30 PM

## 2019-12-07 ENCOUNTER — Other Ambulatory Visit: Payer: Self-pay | Admitting: Obstetrics & Gynecology

## 2019-12-07 DIAGNOSIS — O24410 Gestational diabetes mellitus in pregnancy, diet controlled: Secondary | ICD-10-CM

## 2019-12-08 ENCOUNTER — Ambulatory Visit (INDEPENDENT_AMBULATORY_CARE_PROVIDER_SITE_OTHER): Payer: Medicaid Other

## 2019-12-08 ENCOUNTER — Encounter: Payer: Self-pay | Admitting: Obstetrics & Gynecology

## 2019-12-08 ENCOUNTER — Ambulatory Visit (INDEPENDENT_AMBULATORY_CARE_PROVIDER_SITE_OTHER): Payer: Medicaid Other | Admitting: Obstetrics & Gynecology

## 2019-12-08 VITALS — BP 134/88 | HR 93 | Wt 301.0 lb

## 2019-12-08 DIAGNOSIS — O10919 Unspecified pre-existing hypertension complicating pregnancy, unspecified trimester: Secondary | ICD-10-CM

## 2019-12-08 DIAGNOSIS — O09523 Supervision of elderly multigravida, third trimester: Secondary | ICD-10-CM | POA: Diagnosis not present

## 2019-12-08 DIAGNOSIS — O10913 Unspecified pre-existing hypertension complicating pregnancy, third trimester: Secondary | ICD-10-CM | POA: Diagnosis not present

## 2019-12-08 DIAGNOSIS — Z3A35 35 weeks gestation of pregnancy: Secondary | ICD-10-CM

## 2019-12-08 DIAGNOSIS — O0993 Supervision of high risk pregnancy, unspecified, third trimester: Secondary | ICD-10-CM

## 2019-12-08 DIAGNOSIS — Z8759 Personal history of other complications of pregnancy, childbirth and the puerperium: Secondary | ICD-10-CM

## 2019-12-08 DIAGNOSIS — O24410 Gestational diabetes mellitus in pregnancy, diet controlled: Secondary | ICD-10-CM | POA: Diagnosis not present

## 2019-12-08 DIAGNOSIS — Z302 Encounter for sterilization: Secondary | ICD-10-CM

## 2019-12-08 DIAGNOSIS — Z1389 Encounter for screening for other disorder: Secondary | ICD-10-CM | POA: Diagnosis not present

## 2019-12-08 DIAGNOSIS — Z331 Pregnant state, incidental: Secondary | ICD-10-CM

## 2019-12-08 LAB — POCT URINALYSIS DIPSTICK OB
Blood, UA: NEGATIVE
Glucose, UA: NEGATIVE
Ketones, UA: NEGATIVE
Leukocytes, UA: NEGATIVE
Nitrite, UA: NEGATIVE
POC,PROTEIN,UA: NEGATIVE

## 2019-12-08 NOTE — Progress Notes (Signed)
HIGH-RISK PREGNANCY VISIT Patient name: [name] MRN [id number]  Date of birth: 1981/03/09 Chief Complaint:   Routine Prenatal Visit (Ultrasound)  History of Present Illness:   [name] is a 39 y.o. G55P1001 female at [redacted]w[redacted]d with an Estimated Date of Delivery: 01/08/20 being seen today for ongoing management of a high-risk pregnancy complicated by [facility], H4VQ.  Today she reports backache and pelvic pressure.  Depression screen [facility] 2/9 10/26/2019 10/07/2019 07/01/2019 02/20/2019 10/23/2018  Decreased Interest 0 0 0 0 0  Down, Depressed, Hopeless 0 0 0 0 0  PHQ - 2 Score 0 0 0 0 0  Altered sleeping - 0 0 3 -  Tired, decreased energy - 1 0 1 -  Change in appetite - 1 1 0 -  Feeling bad or failure about yourself  - 0 0 0 -  Trouble concentrating - 0 0 0 -  Moving slowly or fidgety/restless - 0 0 0 -  Suicidal thoughts - 0 0 0 -  PHQ-9 Score - 2 1 4  -  Difficult doing work/chores - - Not difficult at all Not difficult at all -  Some recent data might be hidden    Contractions: Irregular. Vag. Bleeding: None.  Movement: Present. denies leaking of fluid.  Review of Systems:   Pertinent items are noted in HPI Denies abnormal vaginal discharge w/ itching/odor/irritation, headaches, visual changes, shortness of breath, chest pain, abdominal pain, severe nausea/vomiting, or problems with urination or bowel movements unless otherwise stated above. Pertinent History Reviewed:  Reviewed past medical,surgical, social, obstetrical and family history.  Reviewed problem list, medications and allergies. Physical Assessment:   Vitals:   12/08/19 0905  BP: 134/88  Pulse: 93  Weight: (!) 301 lb (136.5 kg)  Body mass index is 50.09 kg/m.           Physical Examination:   General appearance: alert, well appearing, and in no distress  Mental status: alert, oriented to person, place, and time  Skin: warm & dry   Extremities: Edema: Trace    Cardiovascular: normal heart rate  noted  Respiratory: normal respiratory effort, no distress  Abdomen: gravid, soft, non-tender  Pelvic: Cervical exam deferred         Fetal Status:     Movement: Present    Fetal Surveillance Testing today: BPP 8/8 normal Dopplers   Chaperone: n/a    Results for orders placed or performed in visit on 12/08/19 (from the past 24 hour(s))  POC Urinalysis Dipstick OB   Collection Time: 12/08/19  9:07 AM  Result Value Ref Range   Color, UA     Clarity, UA     Glucose, UA Negative Negative   Bilirubin, UA     Ketones, UA n    Spec Grav, UA     Blood, UA n    pH, UA     POC,PROTEIN,UA Negative Negative, Trace, Small (1+), Moderate (2+), Large (3+), 4+   Urobilinogen, UA     Nitrite, UA n    Leukocytes, UA Negative Negative   Appearance     Odor      Assessment & Plan:  1) High-risk pregnancy G2P1001 at [redacted]w[redacted]d with an Estimated Date of Delivery: 01/08/20   2) CHTN on labetalol 200 mg BID, stable, 162 mg ASA  3) A!DM, stable, good control  Meds: No orders of the defined types were placed in this encounter.   Labs/procedures today: BPP 8/8 w/normal Doppler 68%  Treatment Plan:  Twice weekly  surveillance, sonogram alternating with NST, induction at 39 weeks or as clinically indicated   Reviewed: Term labor symptoms and general obstetric precautions including but not limited to vaginal bleeding, contractions, leaking of fluid and fetal movement were reviewed in detail with the patient.  All questions were answered. Has home bp cuff. Rx faxed to . Check bp weekly, let us know if >140/90.   Follow-up: No follow-ups on file.  Orders Placed This Encounter  Procedures  . POC Urinalysis Dipstick OB   Florian Buff CNM, [facility] 12/08/2019 9:20 AM

## 2019-12-08 NOTE — Progress Notes (Signed)
[place] 70+0 wks,cephalic,fhr 525 bpm,BPP 9/1,GAIDKSMM placenta gr 2,AFI 8.3 cm,RI .62,.60=68%

## 2019-12-11 ENCOUNTER — Ambulatory Visit (INDEPENDENT_AMBULATORY_CARE_PROVIDER_SITE_OTHER): Payer: Medicaid Other | Admitting: *Deleted

## 2019-12-11 VITALS — BP 132/82 | HR 101 | Wt 298.0 lb

## 2019-12-11 DIAGNOSIS — Z331 Pregnant state, incidental: Secondary | ICD-10-CM | POA: Diagnosis not present

## 2019-12-11 DIAGNOSIS — O10919 Unspecified pre-existing hypertension complicating pregnancy, unspecified trimester: Secondary | ICD-10-CM | POA: Diagnosis not present

## 2019-12-11 DIAGNOSIS — O09523 Supervision of elderly multigravida, third trimester: Secondary | ICD-10-CM

## 2019-12-11 DIAGNOSIS — Z3A36 36 weeks gestation of pregnancy: Secondary | ICD-10-CM | POA: Diagnosis not present

## 2019-12-11 DIAGNOSIS — Z1389 Encounter for screening for other disorder: Secondary | ICD-10-CM | POA: Diagnosis not present

## 2019-12-11 DIAGNOSIS — Z302 Encounter for sterilization: Secondary | ICD-10-CM

## 2019-12-11 DIAGNOSIS — Z8759 Personal history of other complications of pregnancy, childbirth and the puerperium: Secondary | ICD-10-CM

## 2019-12-11 DIAGNOSIS — O0993 Supervision of high risk pregnancy, unspecified, third trimester: Secondary | ICD-10-CM

## 2019-12-11 LAB — POCT URINALYSIS DIPSTICK OB
Blood, UA: NEGATIVE
Glucose, UA: NEGATIVE
Leukocytes, UA: NEGATIVE
Nitrite, UA: NEGATIVE

## 2019-12-11 NOTE — Progress Notes (Addendum)
   NURSE VISIT- NST  SUBJECTIVE:  [name] is a 39 y.o. G35P1001 female at [redacted]w[redacted]d, here for a NST for pregnancy complicated by [facility] and D6LO.  She reports active fetal movement, contractions: none, vaginal bleeding: none, membranes: intact.   OBJECTIVE:  BP 132/82   Pulse (!) 101   Wt 298 lb (135.2 kg)   LMP 04/03/2019 (Exact Date)   BMI 49.59 kg/m   Appears well, no apparent distress  Results for orders placed or performed in visit on 12/11/19 (from the past 24 hour(s))  POC Urinalysis Dipstick OB   Collection Time: 12/11/19 11:23 AM  Result Value Ref Range   Color, UA     Clarity, UA     Glucose, UA Negative Negative   Bilirubin, UA     Ketones, UA small    Spec Grav, UA     Blood, UA neg    pH, UA     POC,PROTEIN,UA Trace Negative, Trace, Small (1+), Moderate (2+), Large (3+), 4+   Urobilinogen, UA     Nitrite, UA neg    Leukocytes, UA Negative Negative   Appearance     Odor      NST: FHR baseline 140 bpm, Variability: moderate, Accelerations:present, Decelerations:  Absent= Cat 1/Reactive Toco: none   ASSESSMENT: G2P1001 at [redacted]w[redacted]d with CHTN and A1DM NST reactive  PLAN: EFM strip reviewed by Dr. Elonda Husky   Recommendations: keep next appointment as scheduled    Alice Rieger  12/11/2019 11:25 AM  Attestation of Attending Supervision of Nursing Visit Encounter: Evaluation and management procedures were performed by the nursing staff under my supervision and collaboration.  I have reviewed the nurse's note and chart, and I agree with the management and plan.  Jacelyn Grip MD Attending Physician for the Center for [facility] 12/11/2019 11:42 AM   Attestation of Attending Supervision of Nursing Visit Encounter: Evaluation and management procedures were performed by the nursing staff under my supervision and collaboration.  I have reviewed the nurse's note and chart, and I agree with the management and plan.  Jacelyn Grip MD Attending  Physician for the Center for [facility] 12/11/2019 12:59 PM

## 2019-12-13 IMAGING — MR MR KNEE*R* W/O CM
4 of 6 series · 16 of 40 positions shown · non-contrast
Comparison: None.

CLINICAL DATA: Right knee pain for 1 year.  No known injury.

EXAM:
MRI OF THE RIGHT KNEE WITHOUT CONTRAST
TECHNIQUE: Multiplanar, multisequence MR imaging of the knee was performed. No
intravenous contrast was administered.

[Series 3: t2fs axial · axial · 4.0mm · 0.28mm/px · z∈[-66,+49]mm · 3 of 24 slices shown]
[im 1/24]
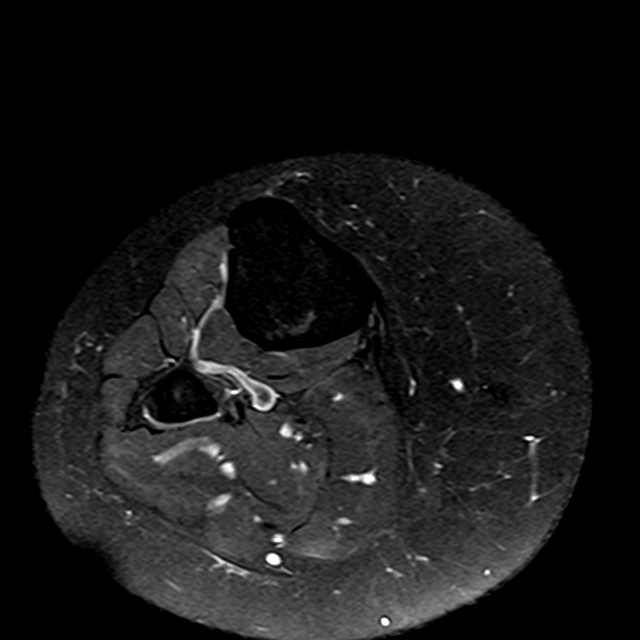
[im 16/24]
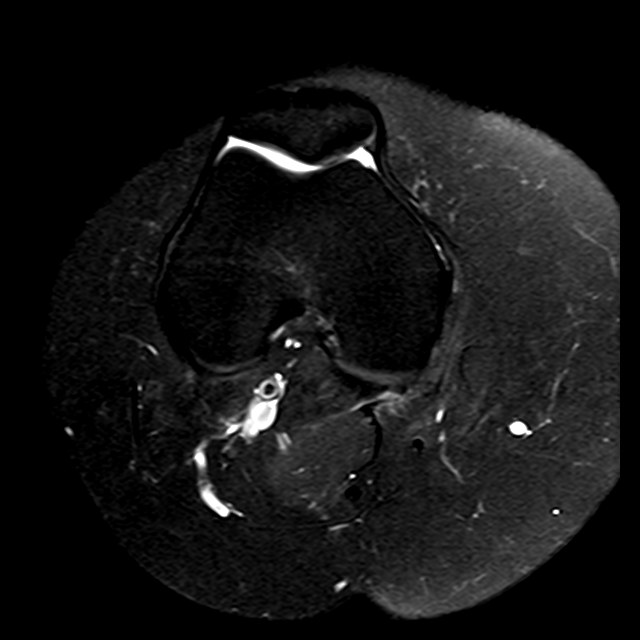
[im 24/24]
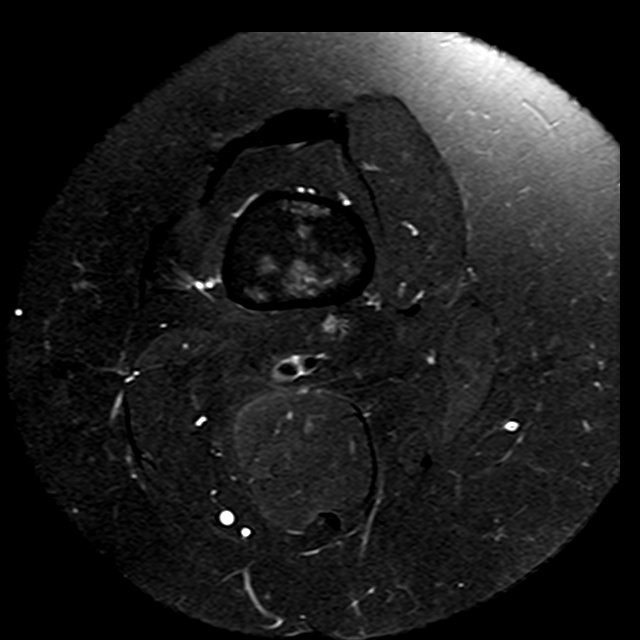

[Series 4: T1 · coronal · 4.0mm · 0.29mm/px · 7 of 30 slices shown]
[im 1/30]
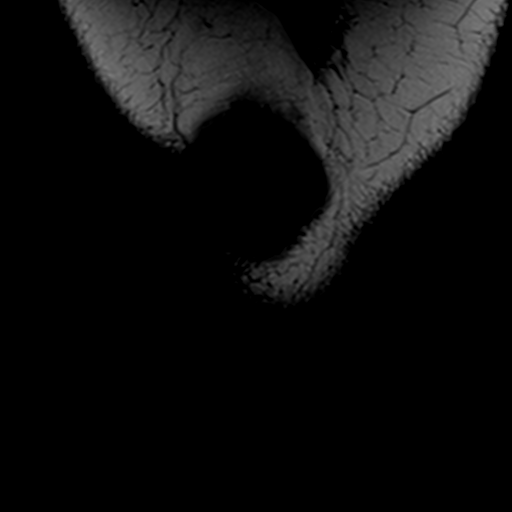
[im 5/30]
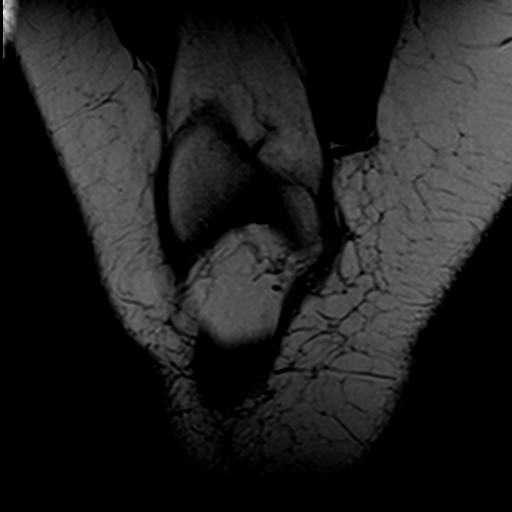
[im 10/30]
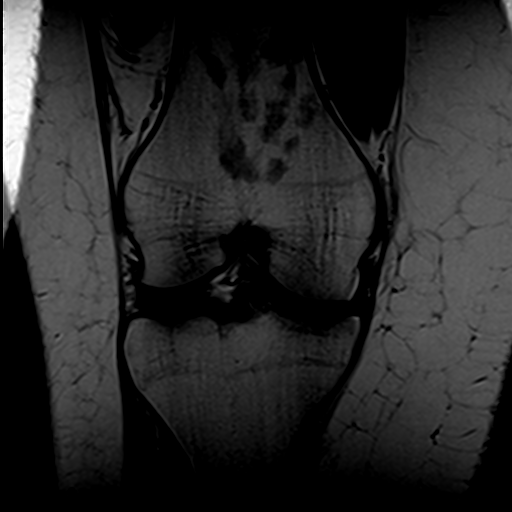
[im 15/30]
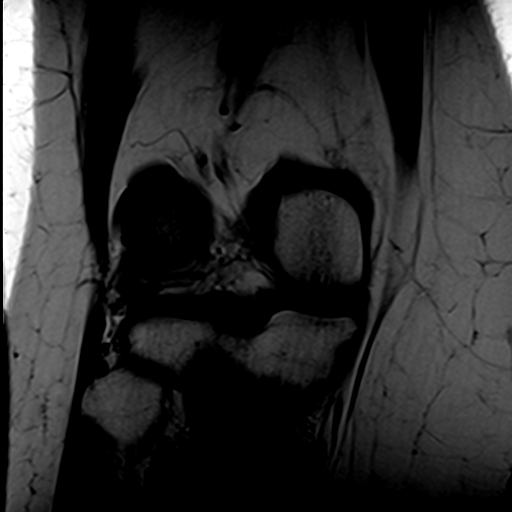
[im 20/30]
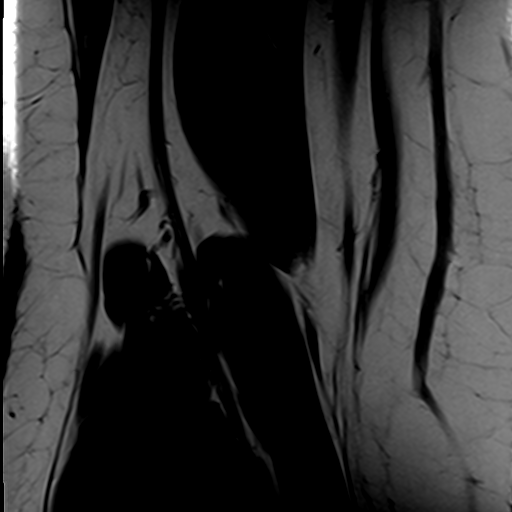
[im 25/30]
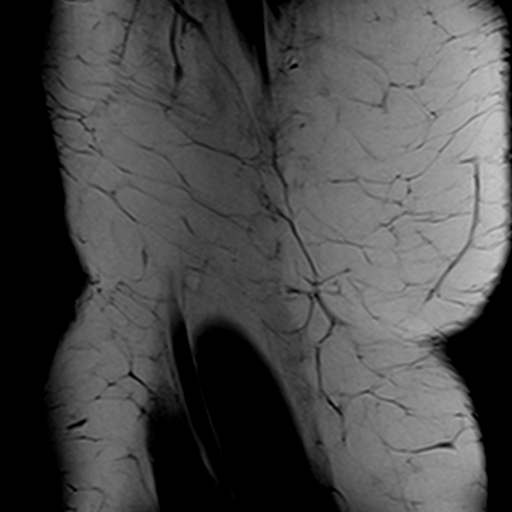
[im 30/30]
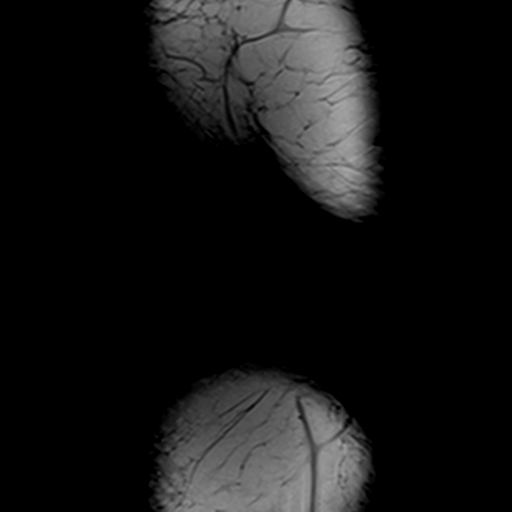

[Series 5: pdfs sag · sagittal · 3.0mm · 0.23mm/px · 3 of 29 slices shown]
[im 5/29]
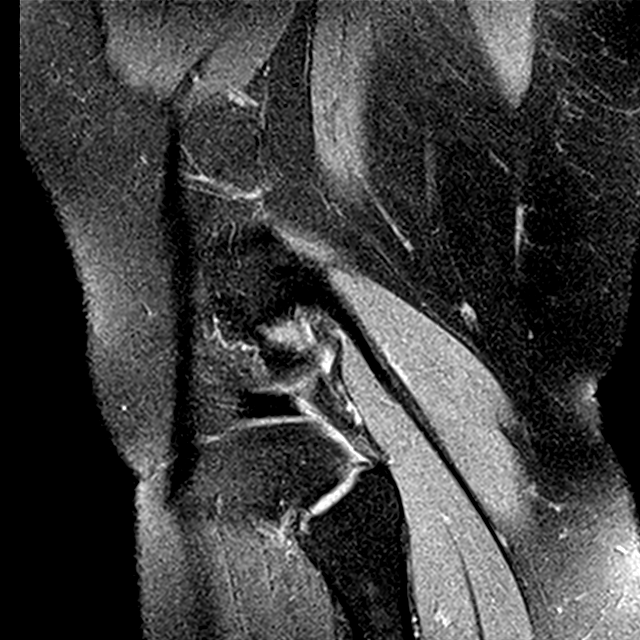
[im 15/29]
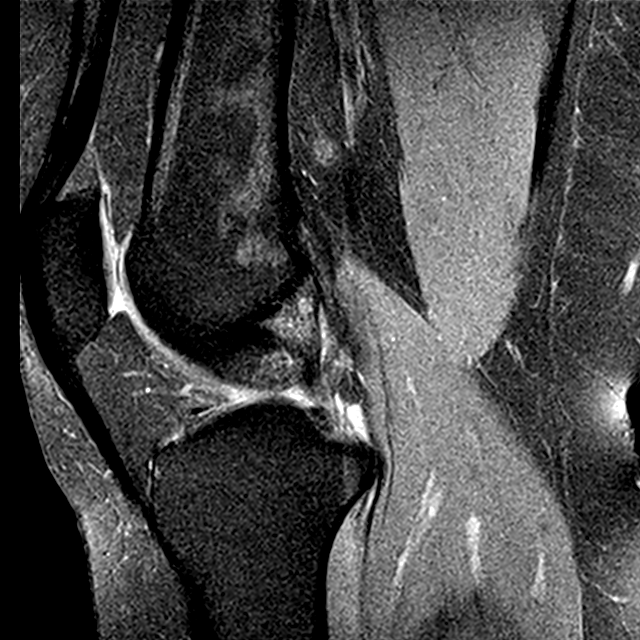
[im 24/29]
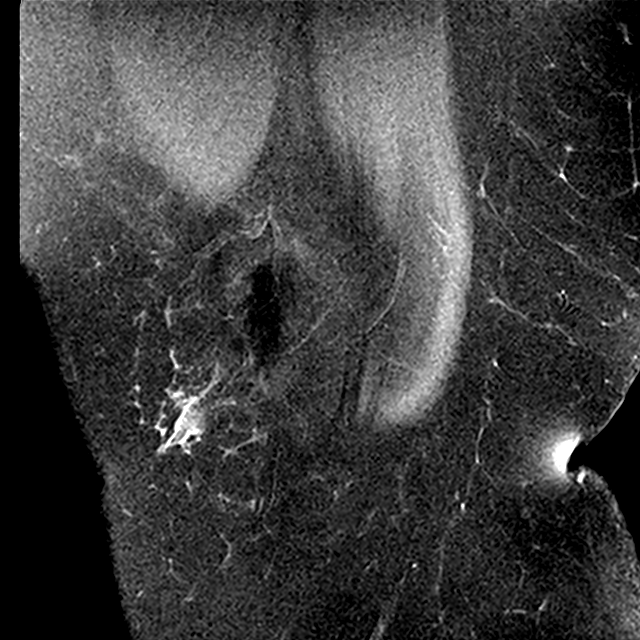

[Series 6: t2fs cor · coronal · 4.0mm · 0.33mm/px · 3 of 29 slices shown]
[im 5/29]
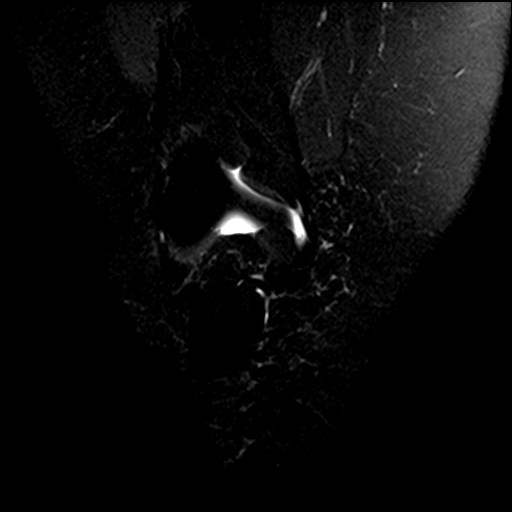
[im 15/29]
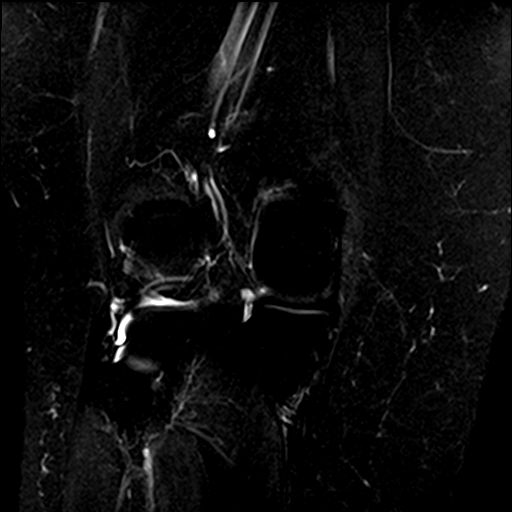
[im 24/29]
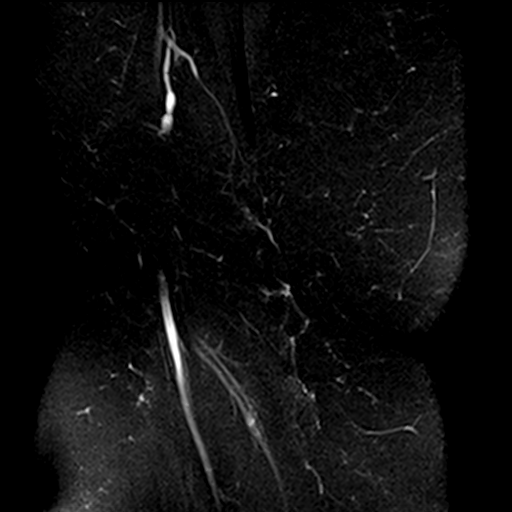

[16 of 40 positions shown; findings below may reference images not displayed]

FINDINGS: MENISCI

Medial meniscus:  Intact.

Lateral meniscus:  Intact.

LIGAMENTS

Cruciates:  Intact ACL and PCL.

Collaterals: Medial collateral ligament is intact. Lateral
collateral ligament complex is intact.

CARTILAGE

Patellofemoral:  No chondral defect.

Medial:  No chondral defect.

Lateral:  No chondral defect.

Joint: No joint effusion. Normal Hoffa's fat. No plical thickening.

Popliteal Fossa:  No Baker cyst.  Intact popliteus tendon.

Extensor Mechanism: Intact quadriceps tendon. Intact patellar
tendon. Intact medial patellar retinaculum. Intact lateral patellar
retinaculum. Intact MPFL.

Bones:  No acute osseous abnormality.  No aggressive osseous lesion.

Other: No fluid collection or hematoma.  Muscles are normal.
IMPRESSION: 1. No internal derangement of the right knee.

## 2019-12-14 ENCOUNTER — Other Ambulatory Visit: Payer: Self-pay | Admitting: Obstetrics & Gynecology

## 2019-12-14 DIAGNOSIS — O24410 Gestational diabetes mellitus in pregnancy, diet controlled: Secondary | ICD-10-CM

## 2019-12-14 DIAGNOSIS — O09523 Supervision of elderly multigravida, third trimester: Secondary | ICD-10-CM

## 2019-12-14 DIAGNOSIS — O10919 Unspecified pre-existing hypertension complicating pregnancy, unspecified trimester: Secondary | ICD-10-CM

## 2019-12-15 ENCOUNTER — Other Ambulatory Visit (HOSPITAL_COMMUNITY)
Admission: RE | Admit: 2019-12-15 | Discharge: 2019-12-15 | Disposition: A | Discharge status: Home / Self Care | Payer: Medicaid Other | Source: Ambulatory Visit | Attending: Obstetrics and Gynecology | Admitting: Obstetrics and Gynecology

## 2019-12-15 ENCOUNTER — Other Ambulatory Visit: Payer: Self-pay

## 2019-12-15 ENCOUNTER — Other Ambulatory Visit: Payer: Medicaid Other

## 2019-12-15 ENCOUNTER — Other Ambulatory Visit: Payer: Self-pay | Admitting: *Deleted

## 2019-12-15 ENCOUNTER — Ambulatory Visit (INDEPENDENT_AMBULATORY_CARE_PROVIDER_SITE_OTHER): Payer: Medicaid Other | Admitting: Obstetrics and Gynecology

## 2019-12-15 ENCOUNTER — Encounter: Payer: Self-pay | Admitting: Obstetrics and Gynecology

## 2019-12-15 ENCOUNTER — Encounter: Payer: Medicaid Other | Admitting: Women's Health

## 2019-12-15 VITALS — BP 125/82 | HR 99 | Wt 300.0 lb

## 2019-12-15 DIAGNOSIS — O09523 Supervision of elderly multigravida, third trimester: Secondary | ICD-10-CM

## 2019-12-15 DIAGNOSIS — Z302 Encounter for sterilization: Secondary | ICD-10-CM

## 2019-12-15 DIAGNOSIS — Z331 Pregnant state, incidental: Secondary | ICD-10-CM

## 2019-12-15 DIAGNOSIS — O1213 Gestational proteinuria, third trimester: Secondary | ICD-10-CM

## 2019-12-15 DIAGNOSIS — Z1389 Encounter for screening for other disorder: Secondary | ICD-10-CM | POA: Diagnosis not present

## 2019-12-15 DIAGNOSIS — Z3A36 36 weeks gestation of pregnancy: Secondary | ICD-10-CM | POA: Diagnosis not present

## 2019-12-15 DIAGNOSIS — O0993 Supervision of high risk pregnancy, unspecified, third trimester: Secondary | ICD-10-CM | POA: Diagnosis not present

## 2019-12-15 DIAGNOSIS — Z8759 Personal history of other complications of pregnancy, childbirth and the puerperium: Secondary | ICD-10-CM

## 2019-12-15 DIAGNOSIS — O10919 Unspecified pre-existing hypertension complicating pregnancy, unspecified trimester: Secondary | ICD-10-CM

## 2019-12-15 LAB — POCT URINALYSIS DIPSTICK OB
Glucose, UA: NEGATIVE
Ketones, UA: NEGATIVE
Nitrite, UA: NEGATIVE

## 2019-12-15 NOTE — Progress Notes (Signed)
PATIENT ID: [name] Kaufman, female     DOB: 1980/11/11, 39 y.o.     MRN: [id number]     [facility] PREGNANCY VISIT PATIENT NAME: [name] Kaufman MRN [id number]  DOB: [birth year] Chief Complaint:   High Risk Gestation (NST)   History of Present Illness:   [name] Kaufman is a 39 y.o. G39P1001 female at [redacted]w[redacted]d with an Estimated Date of Delivery: 01/08/20 being seen today for ongoing management of a high-risk pregnancy complicated by chronic hypertension currently on labetalol 200mg  and diabetes mellitus A1DM.  Today she reports no complaints and bp's acceptable at home, CBG's all less than 90 til this morning when it is 99,  PC's are <120.   Depression screen [facility] 2/9 10/26/2019 10/07/2019 07/01/2019 02/20/2019 10/23/2018  Decreased Interest 0 0 0 0 0  Down, Depressed, Hopeless 0 0 0 0 0  PHQ - 2 Score 0 0 0 0 0  Altered sleeping - 0 0 3 -  Tired, decreased energy - 1 0 1 -  Change in appetite - 1 1 0 -  Feeling bad or failure about yourself  - 0 0 0 -  Trouble concentrating - 0 0 0 -  Moving slowly or fidgety/restless - 0 0 0 -  Suicidal thoughts - 0 0 0 -  PHQ-9 Score - 2 1 4  -  Difficult doing work/chores - - Not difficult at all Not difficult at all -  Some recent data might be hidden     .  .   . denies leaking of fluid.   Review of Systems:   Pertinent items are noted in HPI Denies abnormal vaginal discharge w/ itching/odor/irritation, headaches, visual changes, shortness of breath, chest pain, abdominal pain, severe nausea/vomiting, or problems with urination or bowel movements unless otherwise stated above.  Pertinent History Reviewed:  Reviewed past medical,surgical, social, obstetrical and family history.  Reviewed problem list, medications and allergies.  Physical Assessment:  There were no vitals filed for this visit.There is no height or weight on file to calculate BMI.     BP 125/82   Pulse 99   Wt 300 lb (136.1 kg)   LMP 04/03/2019 (Exact Date)   BMI  49.92 kg/m        Physical Examination:   General appearance: alert, well appearing, and in no distress, oriented to person, place, and time and overweight  Mental status: alert, oriented to person, place, and time, normal mood, behavior, speech, dress, motor activity, and thought processes  Skin: warm & dry   Extremities:      Cardiovascular: normal heart rate noted  Respiratory: normal respiratory effort, no distress  Abdomen: gravid, soft, non-tender  Pelvic: Cervical exam deferred        GS GC CHL collected. Fetal Status:          Fetal Surveillance Testing today: NST Reactive.   Chaperone: [employer].  No results found for this or any previous visit (from the past 24 hour(s)).   Assessment & Plan:  1) High-risk pregnancy G2P1001 at [redacted]w[redacted]d with an Estimated Date of Delivery: 01/08/20   2) .CHTN on labetalol 200 bid, stable  3) A1DM, stable  Meds: No orders of the defined types were placed in this encounter.   Labs/procedures today: NSt Delay EFW til next week's BPP  Treatment Plan:  IOL at 39 wk or prn clinical concerns.  Reviewed: Term labor symptoms and general obstetric precautions including but not limited to vaginal bleeding, contractions, leaking  of fluid and fetal movement were reviewed in detail with the patient.  All questions were answered. has home bp cuff. . Check bp weekly, let us know if >140/90.   Follow-up: No follow-ups on file.   By signing my name below, I, [employer], attest that this documentation has been prepared under the direction and in the presence of Jonnie Kind, MD. Electronically Signed: [place]. 12/15/19. 10:51 AM.  I personally performed the services described in this documentation, which was SCRIBED in my presence. The recorded information has been reviewed and considered accurate. It has been edited as necessary during review. Jonnie Kind, MD

## 2019-12-15 NOTE — Addendum Note (Signed)
Addended by: Linton Rump on: 12/15/2019 12:48 PM   Modules accepted: Orders

## 2019-12-16 ENCOUNTER — Other Ambulatory Visit: Payer: Self-pay | Admitting: Women's Health

## 2019-12-16 ENCOUNTER — Encounter: Payer: Self-pay | Admitting: *Deleted

## 2019-12-16 LAB — CERVICOVAGINAL ANCILLARY ONLY
Chlamydia: NEGATIVE
Comment: NEGATIVE
Comment: NORMAL
Neisseria Gonorrhea: NEGATIVE

## 2019-12-16 LAB — PROTEIN / CREATININE RATIO, URINE
Creatinine, Urine: 336.8 mg/dL
Protein, Ur: 65.5 mg/dL
Protein/Creat Ratio: 194 mg/g creat (ref 0–200)

## 2019-12-16 MED ORDER — LABETALOL HCL 200 MG PO TABS: 200.0000 mg | ORAL_TABLET | Freq: Two times a day (BID) | ORAL | 0 refills | Status: DC

## 2019-12-16 MED ORDER — LABETALOL HCL 200 MG PO TABS: 200.0000 mg | ORAL_TABLET | Freq: Two times a day (BID) | ORAL | 2 refills | Status: DC

## 2019-12-16 NOTE — Addendum Note (Signed)
Addended by: Jonnie Kind on: 12/16/2019 06:29 PM   Modules accepted: Orders

## 2019-12-17 LAB — STREP GP B NAA: Strep Gp B NAA: POSITIVE — AB

## 2019-12-18 ENCOUNTER — Other Ambulatory Visit: Payer: Self-pay

## 2019-12-18 ENCOUNTER — Ambulatory Visit (INDEPENDENT_AMBULATORY_CARE_PROVIDER_SITE_OTHER): Payer: Medicaid Other | Admitting: Women's Health

## 2019-12-18 VITALS — BP 133/83 | HR 108 | Wt 292.0 lb

## 2019-12-18 DIAGNOSIS — O10913 Unspecified pre-existing hypertension complicating pregnancy, third trimester: Secondary | ICD-10-CM | POA: Diagnosis not present

## 2019-12-18 DIAGNOSIS — Z1389 Encounter for screening for other disorder: Secondary | ICD-10-CM

## 2019-12-18 DIAGNOSIS — O24410 Gestational diabetes mellitus in pregnancy, diet controlled: Secondary | ICD-10-CM | POA: Diagnosis not present

## 2019-12-18 DIAGNOSIS — O09523 Supervision of elderly multigravida, third trimester: Secondary | ICD-10-CM

## 2019-12-18 DIAGNOSIS — O0993 Supervision of high risk pregnancy, unspecified, third trimester: Secondary | ICD-10-CM

## 2019-12-18 DIAGNOSIS — O10919 Unspecified pre-existing hypertension complicating pregnancy, unspecified trimester: Secondary | ICD-10-CM

## 2019-12-18 DIAGNOSIS — O288 Other abnormal findings on antenatal screening of mother: Secondary | ICD-10-CM | POA: Diagnosis not present

## 2019-12-18 DIAGNOSIS — Z3A37 37 weeks gestation of pregnancy: Secondary | ICD-10-CM | POA: Diagnosis not present

## 2019-12-18 DIAGNOSIS — Z331 Pregnant state, incidental: Secondary | ICD-10-CM

## 2019-12-18 LAB — POCT URINALYSIS DIPSTICK OB
Blood, UA: NEGATIVE
Glucose, UA: NEGATIVE
Nitrite, UA: NEGATIVE
POC,PROTEIN,UA: NEGATIVE

## 2019-12-18 NOTE — Progress Notes (Addendum)
   NURSE VISIT- NST  SUBJECTIVE:  [name] is a 39 y.o. G57P1001 female at [redacted]w[redacted]d, here for a NST for pregnancy complicated by [facility] and T9QZ.  She reports active fetal movement, contractions: none, vaginal bleeding: none, membranes: intact.   OBJECTIVE:  BP 133/83   Pulse (!) 108   Wt 292 lb (132.5 kg)   LMP 04/03/2019 (Exact Date)   BMI 48.59 kg/m   Appears well, no apparent distress  Results for orders placed or performed in visit on 12/18/19 (from the past 24 hour(s))  POC Urinalysis Dipstick OB   Collection Time: 12/18/19  9:59 AM  Result Value Ref Range   Color, UA     Clarity, UA     Glucose, UA Negative Negative   Bilirubin, UA     Ketones, UA small    Spec Grav, UA     Blood, UA neg    pH, UA     POC,PROTEIN,UA Negative Negative, Trace, Small (1+), Moderate (2+), Large (3+), 4+   Urobilinogen, UA     Nitrite, UA neg    Leukocytes, UA Moderate (2+) (A) Negative   Appearance     Odor      NST: FHR baseline 140 bpm, Variability: moderate, Accelerations:present, Decelerations:  Absent= Cat 1/Reactive Toco: none   ASSESSMENT: G2P1001 at [redacted]w[redacted]d with CHTN and A1DM NST reactive  PLAN: EFM strip reviewed by Knute Neu, CNM, [facility]   Recommendations: keep next appointment as scheduled    Alice Rieger  12/18/2019 10:54 AM   Chart reviewed for nurse visit. Agree with plan of care. See my note, pt worked in w/ me d/t complaints. Roma Schanz, [place] 12/18/2019 11:13 AM

## 2019-12-18 NOTE — Patient Instructions (Signed)
[name], I greatly value your feedback.  If you receive a survey following your visit with [place] today, we appreciate you taking the time to fill it out.  Thanks, Knute Neu, CNM, WHNP-BC  [facility] [place] [facility] ([place], [place] [address]) Entrance C, located off of [address] parking   Go to [email or web address] to register for FREE online childbirth classes   For Dizzy Spells:   This is usually related to either your blood sugar or your blood pressure dropping  Make sure you are staying well hydrated and drinking enough water so that your urine is clear  Eat small frequent meals and snacks containing protein (meat, eggs, nuts, cheese) so that your blood sugar doesn't drop  If you do get dizzy, sit/lay down and get you something to drink and a snack containing protein- you will usually start feeling better in 10-20 minutes      Call the office [phone]) or go to [facility] if:  You begin to have strong, frequent contractions  Your water breaks.  Sometimes it is a big gush of fluid, sometimes it is just a trickle that keeps getting your panties wet or running down your legs  You have vaginal bleeding.  It is normal to have a small amount of spotting if your cervix was checked.   You don't feel your baby moving like normal.  If you don't, get you something to eat and drink and lay down and focus on feeling your baby move.  You should feel at least 10 movements in 2 hours.  If you don't, you should call the office or go to [facility].   Call the office [phone]) or go to [facility] hospital for these signs of pre-eclampsia:  Severe headache that does not go away with Tylenol  Visual changes- seeing spots, double, blurred vision  Pain under your right breast or upper abdomen that does not go away with Tums or heartburn medicine  Nausea and/or vomiting  Severe swelling in your hands, feet, and face     Home Blood Pressure Monitoring for Patients   Your provider has recommended that you check your blood pressure (BP) at least once a week at home. If you do not have a blood pressure cuff at home, one will be provided for you. Contact your provider if you have not received your monitor within 1 week.   Helpful Tips for Accurate Home Blood Pressure Checks   Don't smoke, exercise, or drink caffeine 30 minutes before checking your BP  Use the restroom before checking your BP (a full bladder can raise your pressure)  Relax in a comfortable upright chair  Feet on the ground  Left arm resting comfortably on a flat surface at the level of your heart  Legs uncrossed  Back supported  Sit quietly and don't talk  Place the cuff on your bare arm  Adjust snuggly, so that only two fingertips can fit between your skin and the top of the cuff  Check 2 readings separated by at least one minute  Keep a log of your BP readings  For a visual, please reference this diagram: http://ccnc.care/bpdiagram  Provider Name: [facility]     Phone: [phone]  Zone 1: ALL CLEAR  Continue to monitor your symptoms:   BP reading is less than 140 (top number) or less than 90 (bottom number)   No right upper stomach pain  No headaches or  seeing spots  No feeling nauseated or throwing up  No swelling in face and hands  Zone 2: CAUTION Call your doctor's office for any of the following:   BP reading is greater than 140 (top number) or greater than 90 (bottom number)   Stomach pain under your ribs in the middle or right side  Headaches or seeing spots  Feeling nauseated or throwing up  Swelling in face and hands  Zone 3: EMERGENCY  Seek immediate medical care if you have any of the following:   BP reading is greater than160 (top number) or greater than 110 (bottom number)  Severe headaches not improving with Tylenol  Serious difficulty catching your breath  Any worsening  symptoms from Zone 2   Braxton Hicks Contractions Contractions of the uterus can occur throughout pregnancy, but they are not always a sign that you are in labor. You may have practice contractions called Braxton Hicks contractions. These false labor contractions are sometimes confused with true labor. What are [place] contractions? Braxton Hicks contractions are tightening movements that occur in the muscles of the uterus before labor. Unlike true labor contractions, these contractions do not result in opening (dilation) and thinning of the cervix. Toward the end of pregnancy (32-34 weeks), Braxton Hicks contractions can happen more often and may become stronger. These contractions are sometimes difficult to tell apart from true labor because they can be very uncomfortable. You should not feel embarrassed if you go to the hospital with false labor. Sometimes, the only way to tell if you are in true labor is for your health care provider to look for changes in the cervix. The health care provider will do a physical exam and may monitor your contractions. If you are not in true labor, the exam should show that your cervix is not dilating and your water has not broken. If there are no other health problems associated with your pregnancy, it is completely safe for you to be sent home with false labor. You may continue to have Braxton Hicks contractions until you go into true labor. How to tell the difference between true labor and false labor True labor  Contractions last 30-70 seconds.  Contractions become very regular.  Discomfort is usually felt in the top of the uterus, and it spreads to the lower abdomen and low back.  Contractions do not go away with walking.  Contractions usually become more intense and increase in frequency.  The cervix dilates and gets thinner. False labor  Contractions are usually shorter and not as strong as true labor contractions.  Contractions are usually  irregular.  Contractions are often felt in the front of the lower abdomen and in the groin.  Contractions may go away when you walk around or change positions while lying down.  Contractions get weaker and are shorter-lasting as time goes on.  The cervix usually does not dilate or become thin. Follow these instructions at home:  1. Take over-the-counter and prescription medicines only as told by your health care provider. 2. Keep up with your usual exercises and follow other instructions from your health care provider. 3. Eat and drink lightly if you think you are going into labor. 4. If Braxton Hicks contractions are making you uncomfortable: ? Change your position from lying down or resting to walking, or change from walking to resting. ? Sit and rest in a tub of warm water. ? Drink enough fluid to keep your urine pale yellow. Dehydration may cause these contractions. ?  Do slow and deep breathing several times an hour. 5. Keep all follow-up prenatal visits as told by your health care provider. This is important. Contact a health care provider if:  You have a fever.  You have continuous pain in your abdomen. Get help right away if:  Your contractions become stronger, more regular, and closer together.  You have fluid leaking or gushing from your vagina.  You pass blood-tinged mucus (bloody show).  You have bleeding from your vagina.  You have low back pain that you never had before.  You feel your babys head pushing down and causing pelvic pressure.  Your baby is not moving inside you as much as it used to. Summary  Contractions that occur before labor are called Braxton Hicks contractions, false labor, or practice contractions.  Braxton Hicks contractions are usually shorter, weaker, farther apart, and less regular than true labor contractions. True labor contractions usually become progressively stronger and regular, and they become more frequent.  Manage discomfort  from [facility] contractions by changing position, resting in a warm bath, drinking plenty of water, or practicing deep breathing. This information is not intended to replace advice given to you by your health care provider. Make sure you discuss any questions you have with your health care provider. Document Revised: 03/29/2017 Document Reviewed: 08/30/2016 Elsevier Patient Education  [place].

## 2019-12-18 NOTE — Addendum Note (Signed)
Addended by: Wells Guiles R on: 12/18/2019 11:26 AM   Modules accepted: Level of Service

## 2019-12-18 NOTE — Progress Notes (Signed)
HIGH-RISK PREGNANCY VISIT Patient name: [name] MRN [id number]  Date of birth: 1981/03/08 Chief Complaint:   Non-stress Test (felt fainty yesterday; blood sugar fine)  History of Present Illness:   [name] is a 39 y.o. G59P1001 female at [redacted]w[redacted]d with an Estimated Date of Delivery: 01/08/20 being seen today for ongoing management of a high-risk pregnancy complicated by chronic hypertension currently on Labetalol 200mg  BID and diabetes mellitus A1DM.  Today she reports feeling faint periodically over the last week. Happened when she was in shower, had to sit on bench in shower, still didn't feel better, so got out. Checked bp, SBP was 100, waited a little while and it came back up. Feels like she's drinking enough water. Does not drink caffeinated drinks. Denies any other sx r/t feeling faint. FBS all wnl except 2, 2hr pp all <120. Some pelvic pressure.  Depression screen [facility] 2/9 10/26/2019 10/07/2019 07/01/2019 02/20/2019 10/23/2018  Decreased Interest 0 0 0 0 0  Down, Depressed, Hopeless 0 0 0 0 0  PHQ - 2 Score 0 0 0 0 0  Altered sleeping - 0 0 3 -  Tired, decreased energy - 1 0 1 -  Change in appetite - 1 1 0 -  Feeling bad or failure about yourself  - 0 0 0 -  Trouble concentrating - 0 0 0 -  Moving slowly or fidgety/restless - 0 0 0 -  Suicidal thoughts - 0 0 0 -  PHQ-9 Score - 2 1 4  -  Difficult doing work/chores - - Not difficult at all Not difficult at all -  Some recent data might be hidden    Contractions: Irregular.  .  Movement: Present. denies leaking of fluid.  Review of Systems:   Pertinent items are noted in HPI Denies abnormal vaginal discharge w/ itching/odor/irritation, headaches, visual changes, shortness of breath, chest pain, abdominal pain, severe nausea/vomiting, or problems with urination or bowel movements unless otherwise stated above. Pertinent History Reviewed:  Reviewed past medical,surgical, social, obstetrical and family history.    Reviewed problem list, medications and allergies. Physical Assessment:   Vitals:   12/18/19 0948 12/18/19 0953  BP: 133/83 133/83  Pulse: (!) 108 (!) 108  Weight: 292 lb (132.5 kg) 292 lb (132.5 kg)  Body mass index is 48.59 kg/m.           Physical Examination:   General appearance: alert, well appearing, and in no distress  Mental status: alert, oriented to person, place, and time  Skin: warm & dry   Extremities: Edema: Trace    Cardiovascular: normal heart rate noted  Respiratory: normal respiratory effort, no distress  Abdomen: gravid, soft, non-tender  Pelvic: Cervical exam deferred         Fetal Status: Fetal Heart Rate (bpm): 140   Movement: Present    Fetal Surveillance Testing today: NST: FHR baseline 140 bpm, Variability: moderate, Accelerations:present, Decelerations:  Absent= Cat 1/Reactive Toco: none     Chaperone: n/a    Results for orders placed or performed in visit on 12/18/19 (from the past 24 hour(s))  POC Urinalysis Dipstick OB   Collection Time: 12/18/19  9:59 AM  Result Value Ref Range   Color, UA     Clarity, UA     Glucose, UA Negative Negative   Bilirubin, UA     Ketones, UA small    Spec Grav, UA     Blood, UA neg    pH, UA     POC,PROTEIN,UA  Negative Negative, Trace, Small (1+), Moderate (2+), Large (3+), 4+   Urobilinogen, UA     Nitrite, UA neg    Leukocytes, UA Moderate (2+) (A) Negative   Appearance     Odor      Assessment & Plan:  1) High-risk pregnancy G2P1001 at [redacted]w[redacted]d with an Estimated Date of Delivery: 01/08/20   2) CHTN, stable on Labetalol 200mg  BID, ASA  3) A1DM, stable- has EFW next Tues [employer] was out this week)  4) Dizzy/faint spells> sounds like bp may be dropping periodically, but hesitant to decrease labetalol dosage based on current bp's and being in end of 3rd trimester. To increase po water intake, change positions slowly, sit on bench in shower, take it easy over the weekend, and discuss again w/ LHE on Tuesday  appt  Meds: No orders of the defined types were placed in this encounter.   Labs/procedures today: nst  Treatment Plan:  Efw/bpp on Tues, nst 2x/wk, IOL @ 39wks  Reviewed: Term labor symptoms and general obstetric precautions including but not limited to vaginal bleeding, contractions, leaking of fluid and fetal movement were reviewed in detail with the patient.  All questions were answered.   Follow-up: Return for As scheduled.  Orders Placed This Encounter  Procedures  . POC Urinalysis Dipstick OB   Roma Schanz CNM, [facility] 12/18/2019 11:26 AM

## 2019-12-21 DIAGNOSIS — Z20828 Contact with and (suspected) exposure to other viral communicable diseases: Secondary | ICD-10-CM | POA: Diagnosis not present

## 2019-12-22 ENCOUNTER — Encounter: Payer: Self-pay | Admitting: Obstetrics & Gynecology

## 2019-12-22 ENCOUNTER — Ambulatory Visit (INDEPENDENT_AMBULATORY_CARE_PROVIDER_SITE_OTHER): Payer: Medicaid Other | Admitting: Obstetrics & Gynecology

## 2019-12-22 ENCOUNTER — Ambulatory Visit (INDEPENDENT_AMBULATORY_CARE_PROVIDER_SITE_OTHER): Payer: Medicaid Other

## 2019-12-22 VITALS — BP 129/91 | HR 96 | Wt 299.0 lb

## 2019-12-22 DIAGNOSIS — Z3A37 37 weeks gestation of pregnancy: Secondary | ICD-10-CM

## 2019-12-22 DIAGNOSIS — O10913 Unspecified pre-existing hypertension complicating pregnancy, third trimester: Secondary | ICD-10-CM

## 2019-12-22 DIAGNOSIS — O10919 Unspecified pre-existing hypertension complicating pregnancy, unspecified trimester: Secondary | ICD-10-CM

## 2019-12-22 DIAGNOSIS — Z331 Pregnant state, incidental: Secondary | ICD-10-CM

## 2019-12-22 DIAGNOSIS — O0993 Supervision of high risk pregnancy, unspecified, third trimester: Secondary | ICD-10-CM

## 2019-12-22 DIAGNOSIS — Z8759 Personal history of other complications of pregnancy, childbirth and the puerperium: Secondary | ICD-10-CM

## 2019-12-22 DIAGNOSIS — O09523 Supervision of elderly multigravida, third trimester: Secondary | ICD-10-CM | POA: Diagnosis not present

## 2019-12-22 DIAGNOSIS — O24410 Gestational diabetes mellitus in pregnancy, diet controlled: Secondary | ICD-10-CM

## 2019-12-22 DIAGNOSIS — Z1389 Encounter for screening for other disorder: Secondary | ICD-10-CM

## 2019-12-22 DIAGNOSIS — Z302 Encounter for sterilization: Secondary | ICD-10-CM

## 2019-12-22 LAB — POCT URINALYSIS DIPSTICK OB
Blood, UA: NEGATIVE
Glucose, UA: NEGATIVE
Ketones, UA: NEGATIVE
Nitrite, UA: NEGATIVE
POC,PROTEIN,UA: NEGATIVE

## 2019-12-22 NOTE — Progress Notes (Signed)
[place] 31+6 wks,cephalic,BPP 7/4,ADL 258 bpm,anterior placenta gr 3,RI .60,.59,.58,.66=67%,AFI 13 cm

## 2019-12-22 NOTE — Progress Notes (Signed)
HIGH-RISK PREGNANCY VISIT Patient name: [name] MRN [id number]  Date of birth: [birth year] Chief Complaint:   Routine Prenatal Visit ([place] today)  History of Present Illness:   [name] is a 39 y.o. G20P1001 female at [redacted]w[redacted]d with an Estimated Date of Delivery: 01/08/20 being seen today for ongoing management of a high-risk pregnancy complicated by [facility], P9JK  Today she reports no complaints.  Depression screen [facility] 2/9 10/26/2019 10/07/2019 07/01/2019 02/20/2019 10/23/2018  Decreased Interest 0 0 0 0 0  Down, Depressed, Hopeless 0 0 0 0 0  PHQ - 2 Score 0 0 0 0 0  Altered sleeping - 0 0 3 -  Tired, decreased energy - 1 0 1 -  Change in appetite - 1 1 0 -  Feeling bad or failure about yourself  - 0 0 0 -  Trouble concentrating - 0 0 0 -  Moving slowly or fidgety/restless - 0 0 0 -  Suicidal thoughts - 0 0 0 -  PHQ-9 Score - 2 1 4  -  Difficult doing work/chores - - Not difficult at all Not difficult at all -  Some recent data might be hidden    Contractions: Not present. Vag. Bleeding: None.  Movement: Present. denies leaking of fluid.  Review of Systems:   Pertinent items are noted in HPI Denies abnormal vaginal discharge w/ itching/odor/irritation, headaches, visual changes, shortness of breath, chest pain, abdominal pain, severe nausea/vomiting, or problems with urination or bowel movements unless otherwise stated above. Pertinent History Reviewed:  Reviewed past medical,surgical, social, obstetrical and family history.  Reviewed problem list, medications and allergies. Physical Assessment:   Vitals:   12/22/19 0906  BP: (!) 129/91  Pulse: 96  Weight: 299 lb (135.6 kg)  Body mass index is 49.76 kg/m.           Physical Examination:   General appearance: alert, well appearing, and in no distress  Mental status: alert, oriented to person, place, and time  Skin: warm & dry   Extremities: Edema: Trace    Cardiovascular: normal heart rate noted  Respiratory:  normal respiratory effort, no distress  Abdomen: gravid, soft, non-tender  Pelvic: Cervical exam deferred        No yeast appreciated Fetal Status:     Movement: Present    Fetal Surveillance Testing today: BPP 8/8 with normal Dopplers, EFW 20%   Chaperone: Amanda Rash    Results for orders placed or performed in visit on 12/22/19 (from the past 24 hour(s))  POC Urinalysis Dipstick OB   Collection Time: 12/22/19  9:08 AM  Result Value Ref Range   Color, UA     Clarity, UA     Glucose, UA Negative Negative   Bilirubin, UA     Ketones, UA neg    Spec Grav, UA     Blood, UA neg    pH, UA     POC,PROTEIN,UA Negative Negative, Trace, Small (1+), Moderate (2+), Large (3+), 4+   Urobilinogen, UA     Nitrite, UA neg    Leukocytes, UA Trace (A) Negative   Appearance     Odor      Assessment & Plan:  1) High-risk pregnancy G2P1001 at [redacted]w[redacted]d with an Estimated Date of Delivery: 01/08/20   2) CHTN, stable,labetalol 200mg  BID  3) A1DM, stable, CBG are still good a couple of elevated fastings  Meds: No orders of the defined types were placed in this encounter.   Labs/procedures today: BPP 8/8 w/normal  Dopplers  Treatment Plan:  Twice weekly surveillance IOL 39 weeks  Reviewed: Term labor symptoms and general obstetric precautions including but not limited to vaginal bleeding, contractions, leaking of fluid and fetal movement were reviewed in detail with the patient.  All questions were answered. Has home bp cuff. Rx faxed to . Check bp weekly, let us know if >140/90.   Follow-up: No follow-ups on file.  Orders Placed This Encounter  Procedures  . POC Urinalysis Dipstick OB   Florian Buff  12/22/2019 9:23 AM

## 2019-12-25 ENCOUNTER — Inpatient Hospital Stay (HOSPITAL_COMMUNITY)
Admission: AD | Admit: 2019-12-25 | Discharge: 2019-12-30 | DRG: 806 | Disposition: A | Discharge status: Home / Self Care | Payer: Medicaid Other | Attending: Obstetrics & Gynecology | Admitting: Obstetrics & Gynecology

## 2019-12-25 ENCOUNTER — Encounter (HOSPITAL_COMMUNITY): Payer: Self-pay | Admitting: Obstetrics & Gynecology

## 2019-12-25 ENCOUNTER — Other Ambulatory Visit: Payer: Self-pay

## 2019-12-25 ENCOUNTER — Ambulatory Visit (INDEPENDENT_AMBULATORY_CARE_PROVIDER_SITE_OTHER): Payer: Medicaid Other | Admitting: *Deleted

## 2019-12-25 VITALS — BP 128/89 | HR 96 | Wt 299.0 lb

## 2019-12-25 DIAGNOSIS — O10919 Unspecified pre-existing hypertension complicating pregnancy, unspecified trimester: Secondary | ICD-10-CM

## 2019-12-25 DIAGNOSIS — O24410 Gestational diabetes mellitus in pregnancy, diet controlled: Secondary | ICD-10-CM | POA: Diagnosis not present

## 2019-12-25 DIAGNOSIS — O99214 Obesity complicating childbirth: Secondary | ICD-10-CM | POA: Diagnosis not present

## 2019-12-25 DIAGNOSIS — O0990 Supervision of high risk pregnancy, unspecified, unspecified trimester: Secondary | ICD-10-CM

## 2019-12-25 DIAGNOSIS — Z8759 Personal history of other complications of pregnancy, childbirth and the puerperium: Secondary | ICD-10-CM

## 2019-12-25 DIAGNOSIS — Z331 Pregnant state, incidental: Secondary | ICD-10-CM | POA: Diagnosis not present

## 2019-12-25 DIAGNOSIS — Z3A38 38 weeks gestation of pregnancy: Secondary | ICD-10-CM | POA: Diagnosis not present

## 2019-12-25 DIAGNOSIS — K219 Gastro-esophageal reflux disease without esophagitis: Secondary | ICD-10-CM | POA: Diagnosis present

## 2019-12-25 DIAGNOSIS — O09529 Supervision of elderly multigravida, unspecified trimester: Secondary | ICD-10-CM

## 2019-12-25 DIAGNOSIS — O99284 Endocrine, nutritional and metabolic diseases complicating childbirth: Secondary | ICD-10-CM | POA: Diagnosis present

## 2019-12-25 DIAGNOSIS — R0602 Shortness of breath: Secondary | ICD-10-CM

## 2019-12-25 DIAGNOSIS — N179 Acute kidney failure, unspecified: Secondary | ICD-10-CM | POA: Diagnosis not present

## 2019-12-25 DIAGNOSIS — O99824 Streptococcus B carrier state complicating childbirth: Secondary | ICD-10-CM | POA: Diagnosis present

## 2019-12-25 DIAGNOSIS — O24420 Gestational diabetes mellitus in childbirth, diet controlled: Secondary | ICD-10-CM | POA: Diagnosis present

## 2019-12-25 DIAGNOSIS — O9952 Diseases of the respiratory system complicating childbirth: Secondary | ICD-10-CM | POA: Diagnosis present

## 2019-12-25 DIAGNOSIS — O99892 Other specified diseases and conditions complicating childbirth: Secondary | ICD-10-CM | POA: Diagnosis not present

## 2019-12-25 DIAGNOSIS — O114 Pre-existing hypertension with pre-eclampsia, complicating childbirth: Secondary | ICD-10-CM | POA: Diagnosis not present

## 2019-12-25 DIAGNOSIS — Z1389 Encounter for screening for other disorder: Secondary | ICD-10-CM

## 2019-12-25 DIAGNOSIS — Z09 Encounter for follow-up examination after completed treatment for conditions other than malignant neoplasm: Secondary | ICD-10-CM

## 2019-12-25 DIAGNOSIS — Z3493 Encounter for supervision of normal pregnancy, unspecified, third trimester: Secondary | ICD-10-CM

## 2019-12-25 DIAGNOSIS — O9962 Diseases of the digestive system complicating childbirth: Secondary | ICD-10-CM | POA: Diagnosis present

## 2019-12-25 DIAGNOSIS — O09523 Supervision of elderly multigravida, third trimester: Secondary | ICD-10-CM | POA: Diagnosis not present

## 2019-12-25 DIAGNOSIS — Z302 Encounter for sterilization: Secondary | ICD-10-CM

## 2019-12-25 DIAGNOSIS — O113 Pre-existing hypertension with pre-eclampsia, third trimester: Secondary | ICD-10-CM | POA: Diagnosis not present

## 2019-12-25 DIAGNOSIS — O0993 Supervision of high risk pregnancy, unspecified, third trimester: Secondary | ICD-10-CM

## 2019-12-25 DIAGNOSIS — J811 Chronic pulmonary edema: Secondary | ICD-10-CM

## 2019-12-25 DIAGNOSIS — O1414 Severe pre-eclampsia complicating childbirth: Secondary | ICD-10-CM | POA: Diagnosis present

## 2019-12-25 DIAGNOSIS — J4540 Moderate persistent asthma, uncomplicated: Secondary | ICD-10-CM | POA: Diagnosis present

## 2019-12-25 DIAGNOSIS — O10913 Unspecified pre-existing hypertension complicating pregnancy, third trimester: Secondary | ICD-10-CM | POA: Diagnosis not present

## 2019-12-25 DIAGNOSIS — J4530 Mild persistent asthma, uncomplicated: Secondary | ICD-10-CM | POA: Diagnosis present

## 2019-12-25 DIAGNOSIS — O1002 Pre-existing essential hypertension complicating childbirth: Secondary | ICD-10-CM | POA: Diagnosis not present

## 2019-12-25 LAB — CBC
HCT: 32.9 % — ABNORMAL LOW (ref 36.0–46.0)
Hemoglobin: 10.9 g/dL — ABNORMAL LOW (ref 12.0–15.0)
MCH: 29.5 pg (ref 26.0–34.0)
MCHC: 33.1 g/dL (ref 30.0–36.0)
MCV: 89.2 fL (ref 80.0–100.0)
Platelets: 266 10*3/uL (ref 150–400)
RBC: 3.69 MIL/uL — ABNORMAL LOW (ref 3.87–5.11)
RDW: 13.4 % (ref 11.5–15.5)
WBC: 13.4 10*3/uL — ABNORMAL HIGH (ref 4.0–10.5)
nRBC: 0 % (ref 0.0–0.2)

## 2019-12-25 LAB — URINALYSIS, ROUTINE W REFLEX MICROSCOPIC
Bilirubin Urine: NEGATIVE
Glucose, UA: NEGATIVE mg/dL
Hgb urine dipstick: NEGATIVE
Ketones, ur: 5 mg/dL — AB
Nitrite: NEGATIVE
Protein, ur: 30 mg/dL — AB
Specific Gravity, Urine: 1.026 (ref 1.005–1.030)
WBC, UA: >50 WBC/hpf — ABNORMAL HIGH (ref 0–5)
pH: 6 (ref 5.0–8.0)

## 2019-12-25 LAB — COMPREHENSIVE METABOLIC PANEL
ALT: 10 U/L (ref 0–44)
AST: 17 U/L (ref 15–41)
Albumin: 2.4 g/dL — ABNORMAL LOW (ref 3.5–5.0)
Alkaline Phosphatase: 99 U/L (ref 38–126)
Anion gap: 10 (ref 5–15)
BUN: 8 mg/dL (ref 6–20)
CO2: 18 mmol/L — ABNORMAL LOW (ref 22–32)
Calcium: 8.9 mg/dL (ref 8.9–10.3)
Chloride: 108 mmol/L (ref 98–111)
Creatinine, Ser: 0.77 mg/dL (ref 0.44–1.00)
GFR calc Af Amer: >60 mL/min (ref >60)
GFR calc non Af Amer: >60 mL/min (ref >60)
Glucose, Bld: 101 mg/dL — ABNORMAL HIGH (ref 70–99)
Potassium: 3.8 mmol/L (ref 3.5–5.1)
Sodium: 136 mmol/L (ref 135–145)
Total Bilirubin: 0.6 mg/dL (ref 0.3–1.2)
Total Protein: 5.8 g/dL — ABNORMAL LOW (ref 6.5–8.1)

## 2019-12-25 LAB — POCT URINALYSIS DIPSTICK OB
Blood, UA: NEGATIVE
Glucose, UA: NEGATIVE
Nitrite, UA: NEGATIVE
POC,PROTEIN,UA: NEGATIVE

## 2019-12-25 LAB — GLUCOSE, CAPILLARY: Glucose-Capillary: 94 mg/dL (ref 70–99)

## 2019-12-25 LAB — TYPE AND SCREEN
ABO/RH(D): O POS
Antibody Screen: NEGATIVE

## 2019-12-25 LAB — ABO/RH: ABO/RH(D): O POS

## 2019-12-25 MED ORDER — MISOPROSTOL 50MCG HALF TABLET
50.0000 ug | ORAL_TABLET | ORAL | Status: DC
  Administered 2019-12-25 – 2019-12-27 (×5): 50 ug via ORAL
  Filled 2019-12-25 (×5): qty 1

## 2019-12-25 MED ORDER — OXYCODONE-ACETAMINOPHEN 5-325 MG PO TABS: 2.0000 | ORAL_TABLET | ORAL | Status: DC | PRN

## 2019-12-25 MED ORDER — LACTATED RINGERS IV SOLN: 500.0000 mL | INTRAVENOUS | Status: DC | PRN

## 2019-12-25 MED ORDER — OXYTOCIN BOLUS FROM INFUSION
333.0000 mL | Freq: Once | INTRAVENOUS | Status: AC
  Administered 2019-12-28: 333 mL via INTRAVENOUS

## 2019-12-25 MED ORDER — TERBUTALINE SULFATE 1 MG/ML IJ SOLN: 0.2500 mg | Freq: Once | INTRAMUSCULAR | Status: DC | PRN

## 2019-12-25 MED ORDER — ACETAMINOPHEN 325 MG PO TABS
650.0000 mg | ORAL_TABLET | ORAL | Status: DC | PRN
  Administered 2019-12-26 (×2): 650 mg via ORAL
  Filled 2019-12-25 (×3): qty 2

## 2019-12-25 MED ORDER — SODIUM CHLORIDE 0.9 % IV SOLN
5.0000 10*6.[IU] | Freq: Once | INTRAVENOUS | Status: AC
  Administered 2019-12-25: 5 10*6.[IU] via INTRAVENOUS
  Filled 2019-12-25 (×2): qty 5

## 2019-12-25 MED ORDER — OXYTOCIN-SODIUM CHLORIDE 30-0.9 UT/500ML-% IV SOLN
2.5000 [IU]/h | INTRAVENOUS | Status: AC
  Administered 2019-12-28: 2.5 [IU]/h via INTRAVENOUS

## 2019-12-25 MED ORDER — LIDOCAINE HCL (PF) 1 % IJ SOLN: 30.0000 mL | INTRAMUSCULAR | Status: DC | PRN

## 2019-12-25 MED ORDER — ONDANSETRON HCL 4 MG/2ML IJ SOLN
4.0000 mg | Freq: Four times a day (QID) | INTRAMUSCULAR | Status: DC | PRN
  Administered 2019-12-26 – 2019-12-27 (×5): 4 mg via INTRAVENOUS
  Filled 2019-12-25 (×5): qty 2

## 2019-12-25 MED ORDER — LACTATED RINGERS IV SOLN: INTRAVENOUS | Status: DC

## 2019-12-25 MED ORDER — SOD CITRATE-CITRIC ACID 500-334 MG/5ML PO SOLN
30.0000 mL | ORAL | Status: DC | PRN
  Administered 2019-12-27: 30 mL via ORAL
  Filled 2019-12-25: qty 30

## 2019-12-25 MED ORDER — MAGNESIUM SULFATE BOLUS VIA INFUSION
4.0000 g | Freq: Once | INTRAVENOUS | Status: AC
  Administered 2019-12-25: 4 g via INTRAVENOUS
  Filled 2019-12-25: qty 1000

## 2019-12-25 MED ORDER — MAGNESIUM SULFATE 40 GM/1000ML IV SOLN
2.0000 g/h | INTRAVENOUS | Status: DC
  Administered 2019-12-26 – 2019-12-27 (×3): 2 g/h via INTRAVENOUS
  Filled 2019-12-25 (×3): qty 1000

## 2019-12-25 MED ORDER — PENICILLIN G POT IN DEXTROSE 60000 UNIT/ML IV SOLN
3.0000 10*6.[IU] | INTRAVENOUS | Status: DC
  Administered 2019-12-26 – 2019-12-27 (×10): 3 10*6.[IU] via INTRAVENOUS
  Filled 2019-12-25 (×11): qty 50

## 2019-12-25 MED ORDER — OXYCODONE-ACETAMINOPHEN 5-325 MG PO TABS: 1.0000 | ORAL_TABLET | ORAL | Status: DC | PRN

## 2019-12-25 MED ORDER — FAMOTIDINE 20 MG PO TABS: 20.0000 mg | ORAL_TABLET | Freq: Once | ORAL | Status: DC | PRN

## 2019-12-25 NOTE — MAU Note (Signed)
. °  Surena Welge Vara is a 39 y.o. at [redacted]w[redacted]d here in MAU reporting: ctx that started around 1200 this afternoon. Patient denies LOF. Reports having bloody show. Also reports decreased fetal movement. Patient states she has had a headache for about an hour and has been seeing floaters since yesterday.   Pain score: 8 Vitals:   12/25/19 1757  BP: (!) 151/89  Pulse: (!) 113  Resp: 18  Temp: 98.6 F (37 C)  SpO2: 98%     FHT:153 Lab orders placed from triage: UA

## 2019-12-25 NOTE — Progress Notes (Addendum)
   NURSE VISIT- NST  SUBJECTIVE:  [name] is a 39 y.o. G47P1001 female at [redacted]w[redacted]d, here for a NST for pregnancy complicated by [facility] and G6VP.  She reports decreased  fetal movement, contractions: irregular, vaginal bleeding: none, membranes: intact. Lost mucous plug last night.  OBJECTIVE:  BP 128/89   Pulse 96   Wt 299 lb (135.6 kg)   LMP 04/03/2019 (Exact Date)   BMI 49.76 kg/m   Appears well, no apparent distress  Results for orders placed or performed in visit on 12/25/19 (from the past 24 hour(s))  POC Urinalysis Dipstick OB   Collection Time: 12/25/19  9:41 AM  Result Value Ref Range   Color, UA     Clarity, UA     Glucose, UA Negative Negative   Bilirubin, UA     Ketones, UA small    Spec Grav, UA     Blood, UA neg    pH, UA     POC,PROTEIN,UA Negative Negative, Trace, Small (1+), Moderate (2+), Large (3+), 4+   Urobilinogen, UA     Nitrite, UA neg    Leukocytes, UA Moderate (2+) (A) Negative   Appearance     Odor      NST: FHR baseline 140 bpm, Variability: moderate, Accelerations:present, Decelerations:  Absent= Cat 1/Reactive Toco: none   ASSESSMENT: G2P1001 at [redacted]w[redacted]d with CHTN and A1DM NST reactive  PLAN: EFM strip reviewed by Dr. Elonda Husky   Recommendations: keep next appointment as scheduled    Alice Rieger  12/25/2019 10:50 AM  Attestation of Attending Supervision of Nursing Visit Encounter: Evaluation and management procedures were performed by the nursing staff under my supervision and collaboration.  I have reviewed the nurse's note and chart, and I agree with the management and plan.  Jacelyn Grip MD Attending Physician for the Center for [facility]

## 2019-12-25 NOTE — MAU Provider Note (Signed)
Ms. Rosaria Kubin Kouns is a G2P1001 at [redacted]w[redacted]d seen in MAU for labor. BP was elevated at 150s/90s and pt reported h/a and visual disturbances.  Pt with CHTN with superimposed PEC based on symptoms so admit to L&D for IOL per Dr Elonda Husky   SVE by RN Dilation: 1.5 Effacement (%): Thick Cervical Position: Posterior Station: Ballotable Presentation: Vertex (by bedside US ) Exam by:: Fatima Blank, CNM   NST - FHR: 135 bpm / moderate variability / accels present / decels absent NST reactive / TOCO: irregular  Bedside US confirms vertex presentation.  Pt informed that the ultrasound is considered a limited OB ultrasound and is not intended to be a complete ultrasound exam.  Patient also informed that the ultrasound is not being completed with the intent of assessing for fetal or placental anomalies or any pelvic abnormalities.  Explained that the purpose of today's ultrasound is to assess for  presentation.  Patient acknowledges the purpose of the exam and the limitations of the study.    Plan:  Admit to L&D Magnesium sulfate  IOL with Cytotec for cervical ripening See H&P for details  Fatima Blank, CNM  12/25/2019 6:51 PM

## 2019-12-25 NOTE — H&P (Addendum)
OBSTETRIC ADMISSION HISTORY AND PHYSICAL  [name] is a 39 y.o. female G2P1001 with IUP at 43w0dby LMP presenting for IOL for cHTN with SIPE with severe features. She reports +FMs, No LOF, no VB, no double vision or RUQ pain. Does endorse black floaters, intermittent headaches and pedal edema.  She plans on breat and bottle feeding. She request BTL for birth control, has already signed papers. [facility]  Dating: By LMP --->  Estimated Date of Delivery: 01/08/20  Sono:    _0 , CWD, normal anatomy, cephalic presentation, anterior placenta, 2797g, 20% EFW   Prenatal History/Complications:  [id number] with SIPE with severe features Advanced maternal age Asthma History of [place]yn[place]urst[place]bid obesity  Past Medical History: Past Medical History:  Diagnosis Date  . Allergy    banana/pineapple  . Anemia    post pregnancy  . Anxiety   . Asthma   . Autoimmune disease, not elsewhere classified(279.49) 2013   Non specific- [facility], abnormal Bone Marrow signal  . Breast lump in female 11/28/2012   Has tender round mass at 10-11 0'clock right breast 4 finger breaths from areola will [place] . Breast mass, right 08/18/2012  . Chlamydia   . Chronic breast pain 05/31/2015  . Dysmenorrhea 03/23/2014  . Dyspareunia 01/05/2014  . Fibroids, intramural 08/24/2015  . GERD (gastroesophageal reflux disease)   . Gonorrhea   . Headache(784.0)   . History of abnormal cervical Pap smear 04/02/2014  . Hypertension   . LLQ abdominal tenderness 05/31/2015  . Menorrhagia 01/05/2014  . Neuromuscular disorder (HRodessa    lower back and bilat leg pain  . Obesity   . Other and unspecified ovarian cyst 01/12/2014  . Polyclonal gammopathy 07/26/2013   Insignificant  . Recurrent upper respiratory infection (URI)   . Scoliosis   . Urticaria   . Vaginal irritation 09/30/2014  . Vaginal Pap smear, abnormal     Past Surgical History: Past Surgical History:  Procedure  Laterality Date  . ADENOIDECTOMY    . DILATION AND CURETTAGE OF UTERUS    . SHOULDER SURGERY Right 03/10/2019  . TONSILLECTOMY      Obstetrical History: OB History    Gravida  2   Para  1   Term  1   Preterm      AB  0   Living  1     SAB  0   TAB      Ectopic      Multiple      Live Births  1           Social History Social History   Socioeconomic History  . Marital status: Married    Spouse name: Jameel  . Number of children: 1  . Years of education: Not on file  . Highest education level: Not on file  Occupational History  . Not on file  Tobacco Use  . Smoking status: Never Smoker  . Smokeless tobacco: Never Used  Vaping Use  . Vaping Use: Never used  Substance and Sexual Activity  . Alcohol use: No  . Drug use: No  . Sexual activity: Yes    Birth control/protection: None  Other Topics Concern  . Not on file  Social History Narrative  . Not on file   Social Determinants of Health   Financial Resource Strain:   . Difficulty of Paying Living Expenses: Not on file  Food Insecurity: No Food Insecurity  . Worried  About Running Out of Food in the Last Year: Never true  . Ran Out of Food in the Last Year: Never true  Transportation Needs: Unknown  . Lack of Transportation (Medical): No  . Lack of Transportation (Non-Medical): Not on file  Physical Activity: Insufficiently Active  . Days of Exercise per Week: 1 day  . Minutes of Exercise per Session: 10 min  Stress: No Stress Concern Present  . Feeling of Stress : Not at all  Social Connections: Socially Integrated  . Frequency of Communication with Friends and Family: More than three times a week  . Frequency of Social Gatherings with Friends and Family: Three times a week  . Attends Religious Services: More than 4 times per year  . Active Member of Clubs or Organizations: No  . Attends Archivist Meetings: 1 to 4 times per year  . Marital Status: Married    Family  History: Family History  Problem Relation Age of Onset  . Hypertension Mother   . Diabetes Mother   . Heart disease Mother   . Diabetes Father   . Cancer Paternal Aunt        breast  . Hypertension Maternal Grandmother   . Diabetes Son        pre diabetic  . Other Son        overactive bladder  . COPD Maternal Aunt   . Leukemia Maternal Aunt   . Allergic rhinitis Neg Hx   . Angioedema Neg Hx   . Asthma Neg Hx   . Atopy Neg Hx   . Eczema Neg Hx   . Immunodeficiency Neg Hx   . Urticaria Neg Hx     Allergies: Allergies  Allergen Reactions  . Banana Rash  . Pineapple Rash    Medications Prior to Admission  Medication Sig Dispense Refill Last Dose  . Accu-Chek Softclix Lancets lancets Use as instructed to check blood sugar 4 times daily 100 each 12 12/25/2019 at Unknown time  . aspirin (ASPIRIN CHILDRENS) 81 MG chewable tablet Chew 2 tablets (162 mg total) by mouth daily. 60 tablet 10 12/25/2019 at Unknown time  . Blood Glucose Monitoring Suppl (ACCU-CHEK GUIDE ME) w/Device KIT 1 each by Does not apply route 4 (four) times daily. 1 kit 0 12/25/2019 at Unknown time  . Blood Pressure Monitoring (BLOOD PRESSURE CUFF) MISC 1 Device by Does not apply route once a week. To check pressure 3-5 times weekly 1 each 0 12/25/2019 at Unknown time  . labetalol (NORMODYNE) 200 MG tablet Take 1 tablet (200 mg total) by mouth 2 (two) times daily. 60 tablet 2 12/25/2019 at 0700  . Prenatal Vit-DSS-Fe Cbn-FA (PRENATAL AD PO) Take by mouth.    12/25/2019 at Unknown time  . albuterol (PROVENTIL HFA) 108 (90 Base) MCG/ACT inhaler Use 4 puffs every 4-6 hours as needed for cough or wheeze. (Patient not taking: Reported on 12/18/2019) 18 g 2   . Epinastine HCl 0.05 % ophthalmic solution Place 1 drop into both eyes as needed.  (Patient not taking: Reported on 12/18/2019)     . famotidine (PEPCID) 20 MG tablet Take 1 tablet (20 mg total) by mouth 2 (two) times daily. (Patient taking differently: Take 20 mg by  mouth as needed. ) 60 tablet 5   . flunisolide (NASALIDE) 25 MCG/ACT (0.025%) SOLN Place 2 sprays into the nose 2 (two) times daily. (Patient taking differently: Place 2 sprays into the nose as needed. ) 25 mL 5   . glucose  blood (ACCU-CHEK GUIDE) test strip Use as instructed to check blood sugar 4 times daily 50 each 12      Review of Systems   All systems reviewed and negative except as stated in HPI  Blood pressure (!) 150/94, pulse (!) 105, temperature 98.4 F (36.9 C), temperature source Oral, resp. rate 18, last menstrual period 04/03/2019, SpO2 97 %. General appearance: alert, cooperative and no distress Lungs: clear to auscultation bilaterally Heart: regular rate and rhythm Abdomen: soft, non-tender; bowel sounds normal Pelvic: below Extremities: bilateral 1+ edema, no sign of DVT Presentation: cephalic Fetal monitoringBaseline: 140 bpm, Variability: Good {> 6 bpm), Accelerations: Reactive and Decelerations: Variable: mild: <1 min. reassuring due to moderate variability and accelerations Uterine activityFrequency: Every 7-10 minutes Dilation: 1.5 Effacement (%): Thick Station: Ballotable Exam by:: Fatima Blank, CNM   Prenatal labs: ABO, Rh: --/--/O POS Performed at [facility], [place] [address] [place], [place] [address]  [phone]/27 1939) Antibody: NEG (08/27 1820) Rubella: 20.70 (03/03 1018) RPR: Non Reactive (06/09 0900)  HBsAg: Negative (03/03 1018)  HIV: Non Reactive (06/09 0900)  GBS: Positive/-- (08/17 1405) on PCN 2 hr Glucola: failed (88/190/136) Genetic screening: Normal Anatomy US: normal  Prenatal Transfer Tool  Maternal Diabetes: Yes:  Diabetes Type:  Diet controlled Genetic Screening: Normal Maternal Ultrasounds/Referrals: Normal Fetal Ultrasounds or other Referrals:  Referred to Materal Fetal Medicine  Maternal Substance Abuse:  No Significant Maternal Medications:  Meds include: Other: Labetalol 200 BID, ASA 53m BID Significant Maternal  Lab Results: Group B Strep positive on PCN  Results for orders placed or performed during the hospital encounter of 12/25/19 (from the past 24 hour(s))  Urinalysis, Routine w reflex microscopic Urine, Clean Catch   Collection Time: 12/25/19  6:02 PM  Result Value Ref Range   Color, Urine AMBER (A) YELLOW   APPearance HAZY (A) CLEAR   Specific Gravity, Urine 1.026 1.005 - 1.030   pH 6.0 5.0 - 8.0   Glucose, UA NEGATIVE NEGATIVE mg/dL   Hgb urine dipstick NEGATIVE NEGATIVE   Bilirubin Urine NEGATIVE NEGATIVE   Ketones, ur 5 (A) NEGATIVE mg/dL   Protein, ur 30 (A) NEGATIVE mg/dL   Nitrite NEGATIVE NEGATIVE   Leukocytes,Ua LARGE (A) NEGATIVE   WBC, UA >50 (H) 0 - 5 WBC/hpf   Bacteria, UA RARE (A) NONE SEEN   Squamous Epithelial / LPF 0-5 0 - 5   Mucus PRESENT    Ca Oxalate Crys, UA PRESENT   Type and screen MLuther  Collection Time: 12/25/19  6:20 PM  Result Value Ref Range   ABO/RH(D) O POS    Antibody Screen NEG    Sample Expiration      12/28/2019,2359 Performed at [facility], [address], [place] [address] [id number]  CBC   Collection Time: 12/25/19  7:39 PM  Result Value Ref Range   WBC 13.4 (H) 4.0 - 10.5 K/uL   RBC 3.69 (L) 3.87 - 5.11 MIL/uL   Hemoglobin 10.9 (L) 12.0 - 15.0 g/dL   HCT 32.9 (L) 36 - 46 %   MCV 89.2 80.0 - 100.0 fL   MCH 29.5 26.0 - 34.0 pg   MCHC 33.1 30.0 - 36.0 g/dL   RDW 13.4 11.5 - 15.5 %   Platelets 266 150 - 400 K/uL   nRBC 0.0 0.0 - 0.2 %  ABO/Rh   Collection Time: 12/25/19  7:39 PM  Result Value Ref Range   ABO/RH(D)  O POS Performed at [facility], [place] [address] [place], [place] [address]   Results for orders placed or performed in visit on 12/25/19 (from the past 24 hour(s))  POC Urinalysis Dipstick OB   Collection Time: 12/25/19  9:41 AM  Result Value Ref Range   Color, UA     Clarity, UA     Glucose, UA Negative Negative   Bilirubin, UA     Ketones, UA small    Spec Grav, UA      Blood, UA neg    pH, UA     POC,PROTEIN,UA Negative Negative, Trace, Small (1+), Moderate (2+), Large (3+), 4+   Urobilinogen, UA     Nitrite, UA neg    Leukocytes, UA Moderate (2+) (A) Negative   Appearance     Odor      Patient Active Problem List   Diagnosis Date Noted  . Chronic hypertension with superimposed preeclampsia 12/25/2019  . Asymmetric intrauterine growth restriction affecting pregnancy, antepartum 11/18/2019  . Gestational diabetes mellitus, class A1 10/09/2019  . Request for sterilization 08/12/2019  . High-risk pregnancy 07/01/2019  . Chronic hypertension in pregnancy 07/01/2019  . AMA (advanced maternal age) multigravida 35+ 07/01/2019  . History of postpartum hemorrhage 07/01/2019  . Moderate persistent asthma, uncomplicated 69/67/8938  . Fibroids, intramural 08/24/2015  . Polyclonal gammopathy 07/26/2013  . Dysplasia of cervix, unspecified 03/31/2013  . Asthma in adult 04/18/2011  . Hypertension 09/21/2010  . Morbid obesity ([place]) 11/03/2007    Assessment/Plan:  Pascale Maves Sorci is a 39 y.o. G2P1001 at 8w0dhere for IOL for cHTN with SIPE with severe features.  #Labor: Bishop score 5. Plan to start induction with buccal cytotec and recheck in 4 hours. Continue augmentation as appropriate. #Pain: Per patient request. IV pain meds and epidural #FWB: Cat II, reassuring due to moderate variability and accelerations #ID:  GBS+ on PCN #MOF: Breast and bottle #MOC: PP BTL, has consented #Circ:  N/A #Hx of PPH: will plan to hang TXA intrapartum #cHTN with SIPE w/ SF: on Mg. BP's elevated to 1101'Bsystolic. Monitor vitals closely. Treat BP >160/110. #GERD: Pepcid 259mqd PRN indigestion  #Asthma: albuterol PRN, well controlled #A1GDM: q4 BGL during latent labor and q2 BGL during active labor. EFW 2737 (67%ile) at 3669w1dnxiety: not on meds, will consult SW PP  AleSharion SettlerO  12/25/2019, 8:31 PM   GME ATTESTATION:  I saw and evaluated the  patient. I agree with the findings and the plan of care as documented in the resident's note.  AliArrie SenateD OB Fellow, FacOak Ridger WomCameron27/2021 10:02 PM

## 2019-12-26 ENCOUNTER — Encounter (HOSPITAL_COMMUNITY): Payer: Self-pay | Admitting: Obstetrics & Gynecology

## 2019-12-26 ENCOUNTER — Inpatient Hospital Stay (HOSPITAL_COMMUNITY): Payer: Medicaid Other | Admitting: Anesthesiology

## 2019-12-26 DIAGNOSIS — Z3A38 38 weeks gestation of pregnancy: Secondary | ICD-10-CM | POA: Diagnosis not present

## 2019-12-26 DIAGNOSIS — O164 Unspecified maternal hypertension, complicating childbirth: Secondary | ICD-10-CM | POA: Diagnosis not present

## 2019-12-26 DIAGNOSIS — O24429 Gestational diabetes mellitus in childbirth, unspecified control: Secondary | ICD-10-CM | POA: Diagnosis not present

## 2019-12-26 LAB — CBC
HCT: 36.6 % (ref 36.0–46.0)
Hemoglobin: 12.1 g/dL (ref 12.0–15.0)
MCH: 29.5 pg (ref 26.0–34.0)
MCHC: 33.1 g/dL (ref 30.0–36.0)
MCV: 89.3 fL (ref 80.0–100.0)
Platelets: 300 10*3/uL (ref 150–400)
RBC: 4.1 MIL/uL (ref 3.87–5.11)
RDW: 13.4 % (ref 11.5–15.5)
WBC: 12.6 10*3/uL — ABNORMAL HIGH (ref 4.0–10.5)
nRBC: 0 % (ref 0.0–0.2)

## 2019-12-26 LAB — GLUCOSE, CAPILLARY
Glucose-Capillary: 102 mg/dL — ABNORMAL HIGH (ref 70–99)
Glucose-Capillary: 103 mg/dL — ABNORMAL HIGH (ref 70–99)
Glucose-Capillary: 84 mg/dL (ref 70–99)
Glucose-Capillary: 89 mg/dL (ref 70–99)
Glucose-Capillary: 77 mg/dL (ref 70–99)

## 2019-12-26 LAB — RPR: RPR Ser Ql: NONREACTIVE

## 2019-12-26 MED ORDER — FENTANYL CITRATE (PF) 2500 MCG/50ML IJ SOLN
INTRAMUSCULAR | Status: DC | PRN
Start: 2019-12-26 — End: 2019-12-28
  Administered 2019-12-26: 12 mL/h via EPIDURAL

## 2019-12-26 MED ORDER — PHENYLEPHRINE 40 MCG/ML (10ML) SYRINGE FOR IV PUSH (FOR BLOOD PRESSURE SUPPORT)
80.0000 ug | PREFILLED_SYRINGE | INTRAVENOUS | Status: DC | PRN
  Administered 2019-12-27: 80 ug via INTRAVENOUS
  Filled 2019-12-26: qty 10

## 2019-12-26 MED ORDER — EPHEDRINE 5 MG/ML INJ: 10.0000 mg | INTRAVENOUS | Status: DC | PRN

## 2019-12-26 MED ORDER — LIDOCAINE HCL (PF) 1 % IJ SOLN
INTRAMUSCULAR | Status: DC | PRN
  Administered 2019-12-26 (×2): 4 mL via EPIDURAL

## 2019-12-26 MED ORDER — OXYTOCIN-SODIUM CHLORIDE 30-0.9 UT/500ML-% IV SOLN
1.0000 m[IU]/min | INTRAVENOUS | Status: DC
  Administered 2019-12-26 – 2019-12-27 (×2): 2 m[IU]/min via INTRAVENOUS
  Filled 2019-12-26 (×2): qty 500

## 2019-12-26 MED ORDER — FENTANYL-BUPIVACAINE-NACL 0.5-0.125-0.9 MG/250ML-% EP SOLN
12.0000 mL/h | EPIDURAL | Status: DC | PRN
  Administered 2019-12-27: 12 mL/h via EPIDURAL
  Filled 2019-12-26 (×2): qty 250

## 2019-12-26 MED ORDER — PROMETHAZINE HCL 25 MG/ML IJ SOLN
12.5000 mg | Freq: Once | INTRAMUSCULAR | Status: AC
  Administered 2019-12-26: 12.5 mg via INTRAVENOUS
  Filled 2019-12-26: qty 1

## 2019-12-26 MED ORDER — LACTATED RINGERS IV SOLN
500.0000 mL | Freq: Once | INTRAVENOUS | Status: AC
  Administered 2019-12-26: 500 mL via INTRAVENOUS

## 2019-12-26 MED ORDER — PHENYLEPHRINE 40 MCG/ML (10ML) SYRINGE FOR IV PUSH (FOR BLOOD PRESSURE SUPPORT)
80.0000 ug | PREFILLED_SYRINGE | INTRAVENOUS | Status: DC | PRN
  Administered 2019-12-27 (×2): 80 ug via INTRAVENOUS

## 2019-12-26 MED ORDER — DIPHENHYDRAMINE HCL 50 MG/ML IJ SOLN: 12.5000 mg | INTRAMUSCULAR | Status: DC | PRN

## 2019-12-26 MED ORDER — ACETAMINOPHEN 500 MG PO TABS
1000.0000 mg | ORAL_TABLET | Freq: Once | ORAL | Status: AC
  Administered 2019-12-26: 1000 mg via ORAL
  Filled 2019-12-26: qty 2

## 2019-12-26 NOTE — Progress Notes (Signed)
Labor Progress Note [name] is a 39 y.o. G2P1001 at [redacted]w[redacted]d presented for IOL for cHTN w/ SIPE w/ SF.  S: Sitting up in bed. Mildly improved HA. No new complaints.   O:  BP 122/72   Pulse 91   Temp 98.3 F (36.8 C) (Oral)   Resp 20   Ht 5\' 5"  (1.651 m)   Wt 135.6 kg   LMP 04/03/2019 (Exact Date)   SpO2 98%   BMI 49.76 kg/m  EFM: 135/moderate var/pos accels, no decels.  CVE: Dilation: 4 Effacement (%): 50 Cervical Position: Middle Station: -3 Presentation: Vertex Exam by:: Dr. Pilar Plate    A&P: 39 y.o. G2P1001 [redacted]w[redacted]d here for IOL for cHTN w/ SIPR w/ SF.  #Labor: FB out.  Now on pit for the past 4 hours without significant change.  Will stop pit and try one additional cytotec. #Pain: IV PRN for now. Epi upon request. #FWB: Cat I #GBS positive #cHTN w/ SIPE w/ SF: on Mag.  SF symptomatic HA, now improved. No severe range pressures in the past 4 hours.  #Hx of PPH: TXA at delivery #Asthma: albuter PRN #GDMA1: CBGs WNL. Q4 in latent labor, Q 2 in active. #anxiety: consult SW PP.  Matilde Haymaker, MD 8:02 PM

## 2019-12-26 NOTE — Progress Notes (Signed)
Pt up in bathroom at 1610, foley bulb came out.  Pt did pericare. And telemetry montors lost charge.  EFM changed to external

## 2019-12-26 NOTE — Progress Notes (Signed)
Labor Progress Note [name] is a 39 y.o. G2P1001 at [redacted]w[redacted]d presented for IOL for cHTN with SIPE w/SF.  S: Doing well without complaints.   O:  BP 124/75   Pulse 93   Temp 98.3 F (36.8 C) (Oral)   Resp 20   LMP 04/03/2019 (Exact Date)   SpO2 96%  EFM: baseline 140bpm/mod variability/+accels/no decels Toco: irritable   CVE: Dilation: Fingertip Effacement (%): 50 Cervical Position: Middle Station: -3 Presentation: Vertex Exam by:: Dr. Sylvester Harder    A&P: 39 y.o. G2P1001 [redacted]w[redacted]d who presents for IOL for cHTN with SIPE w/SF. #Labor: S/p cytotec x1. Will recheck and place FB if able and likely re-dose cytotec around 0300. #Pain: PRN #FWB: cat 1 #GBS positive #Hx of PPH: will plan TXA  #cHTN with SIPE w/ SF: on Mg. Most recent BP 124/75. Monitor vitals closely. Treat BP >160/110. #Asthma: albuterol PRN, well controlled #A1GDM: q4 BGL during latent labor and q2 BGL during active labor. Last BGL 94. EFW 2737 (67%ile) at [redacted]w[redacted]d #Anxiety: not on meds, will consult SW PP   Arrie Senate, MD 1:55 AM

## 2019-12-26 NOTE — Progress Notes (Signed)
Labor Progress Note ARIKA MAINER is a 39 y.o. G2P1001 at [redacted]w[redacted]d presented for IOL for cHTN w/ SIPE w/ SF.  S: Doing well without complaints.  O:  BP 106/63   Pulse 86   Temp 98.3 F (36.8 C) (Oral)   Resp 18   Ht 5\' 5"  (1.651 m)   Wt 135.6 kg   LMP 04/03/2019 (Exact Date)   SpO2 98%   BMI 49.76 kg/m  EFM: 120/moderate var/pos accels, no decels. Toco: irritable  CVE: Dilation: 4 Effacement (%): 50 Cervical Position: Middle Station: -3 Presentation: Vertex Exam by:: Dr. Pilar Plate    A&P: 39 y.o. G2P1001 [redacted]w[redacted]d here for IOL for cHTN w/ SIPR w/ SF.  #Labor: S/p FB cytotec x4. Pitocin initiated but then patient with no cervical change x4 hours. Repeat cytotec administered at that time (~2000 on 8/28). Patient with minimal change and cervical thickness unchanged from last exam, will re-dose cytotec and continue to augment as able. #Pain: PRN #FWB: Cat I #GBS positive, adequate ppx #cHTN w/ SIPE w/ SF: on Mag. Asymptomatic. BP well controlled, intermittently low.  #Hx of PPH: TXA at delivery #Asthma: albuterol PRN #GDMA1: CBGs WNL. Q4 in latent labor, Q 2 in active. #anxiety: consult SW PP.  Arrie Senate, MD 10:34 PM

## 2019-12-26 NOTE — Anesthesia Procedure Notes (Signed)
Epidural Patient location during procedure: OB Start time: 12/26/2019 8:45 PM End time: 12/26/2019 8:52 PM  Staffing Anesthesiologist: Josephine Igo, MD Performed: anesthesiologist   Preanesthetic Checklist Completed: patient identified, IV checked, site marked, risks and benefits discussed, surgical consent, monitors and equipment checked, pre-op evaluation and timeout performed  Epidural Patient position: sitting Prep: DuraPrep and site prepped and draped Patient monitoring: continuous pulse ox and blood pressure Approach: midline Location: L3-L4 Injection technique: LOR air  Needle:  Needle type: Tuohy  Needle gauge: 17 G Needle length: 9 cm and 9 Needle insertion depth: 8 cm Catheter type: closed end flexible Catheter size: 19 Gauge Catheter at skin depth: 13 cm Test dose: negative and Other  Assessment Events: blood not aspirated, injection not painful, no injection resistance, no paresthesia and negative IV test  Additional Notes Patient identified. Risks and benefits discussed including failed block, incomplete  Pain control, post dural puncture headache, nerve damage, paralysis, blood pressure Changes, nausea, vomiting, reactions to medications-both toxic and allergic and post Partum back pain. All questions were answered. Patient expressed understanding and wished to proceed. Sterile technique was used throughout procedure. Epidural site was Dressed with sterile barrier dressing. No paresthesias, signs of intravascular injection Or signs of intrathecal spread were encountered.  Patient was more comfortable after the epidural was dosed. Please see RN's note for documentation of vital signs and FHR which are stable. Reason for block:procedure for pain

## 2019-12-26 NOTE — Progress Notes (Signed)
Patient ID: [name], female   DOB: 23-Nov-1980, 39 y.o.   MRN: [id number] See note by resident Seabron Spates, CNM

## 2019-12-26 NOTE — Progress Notes (Signed)
J Thorton RN watching pt from 1530-1600 for me

## 2019-12-26 NOTE — Progress Notes (Signed)
Labor Progress Note [name] is a 39 y.o. G2P1001 at [redacted]w[redacted]d presented for IOL for cHTN with SIPE w/SF.  S: Doing well without complaints.   O:  BP 137/87   Pulse 93   Temp 98.2 F (36.8 C) (Oral)   Resp (!) 21   Ht 5\' 5"  (1.651 m)   Wt 135.6 kg   LMP 04/03/2019 (Exact Date)   SpO2 98%   BMI 49.76 kg/m  EFM: baseline 120bpm/mod variability/+accels/no decels Toco: irritable   CVE: Dilation: 1 Effacement (%): 20 Cervical Position: Middle Station: -3 Presentation: Vertex Exam by:: Dr. Sylvester Harder    A&P: 39 y.o. G2P1001 [redacted]w[redacted]d who presents for IOL for cHTN with SIPE w/SF. #Labor: S/p cytotec x1. Will repeat cytotec. Consider FB at next check. #Pain: PRN #FWB: cat 1 #GBS positive #Hx of PPH: will plan TXA  #cHTN with SIPE w/ SF: on Mg. Most recent BP 137/87. Monitor vitals closely. Treat BP >160/110. #Asthma: albuterol PRN, well controlled #A1GDM: q4 BGL during latent labor and q2 BGL during active labor. Last BGL 102. EFW 2737 (67%ile) at [redacted]w[redacted]d #Anxiety: not on meds, will consult SW PP   Arrie Senate, MD 4:39 AM

## 2019-12-26 NOTE — Anesthesia Preprocedure Evaluation (Signed)
Anesthesia Evaluation  Patient identified by MRN, date of birth, ID band Patient awake    Reviewed: Allergy & Precautions, NPO status , Patient's Chart, lab work & pertinent test results, reviewed documented beta blocker date and time   Airway Mallampati: II  TM Distance: >3 FB Neck ROM: Full    Dental no notable dental hx. (+) Teeth Intact   Pulmonary asthma , Recent URI , Resolved,    Pulmonary exam normal breath sounds clear to auscultation       Cardiovascular hypertension, Pt. on medications and Pt. on home beta blockers Normal cardiovascular exam Rhythm:Regular Rate:Normal     Neuro/Psych  Headaches, Anxiety  Neuromuscular disease    GI/Hepatic Neg liver ROS, GERD  Medicated and Controlled,  Endo/Other  diabetes, Well Controlled, GestationalMorbid obesity  Renal/GU negative Renal ROS  negative genitourinary   Musculoskeletal Back   Abdominal (+) + obese,   Peds  Hematology  (+) anemia , Polyclonal gammopathy   Anesthesia Other Findings   Reproductive/Obstetrics (+) Pregnancy cHTN with superimposed pre eclampsia Cervical dysplasia Intramural fibroids                             Anesthesia Physical Anesthesia Plan  ASA: III  Anesthesia Plan: Epidural   Post-op Pain Management:    Induction:   PONV Risk Score and Plan:   Airway Management Planned: Natural Airway  Additional Equipment:   Intra-op Plan:   Post-operative Plan:   Informed Consent: I have reviewed the patients History and Physical, chart, labs and discussed the procedure including the risks, benefits and alternatives for the proposed anesthesia with the patient or authorized representative who has indicated his/her understanding and acceptance.       Plan Discussed with: Anesthesiologist  Anesthesia Plan Comments:         Anesthesia Quick Evaluation

## 2019-12-26 NOTE — Progress Notes (Signed)
Labor Progress Note [name] is a 39 y.o. G2P1001 at [redacted]w[redacted]d presented for IOL for cHTN w/ SIPE w/ SF.  S: Resting comfortably. No current headaches or vision changes, no RUQ pain.  O:  BP (!) 145/98   Pulse 96   Temp 97.7 F (36.5 C) (Oral)   Resp 17   Ht 5\' 5"  (1.651 m)   Wt 135.6 kg   LMP 04/03/2019 (Exact Date)   SpO2 98%   BMI 49.76 kg/m  EFM: 130/moderate var/pos accels, no decels  CVE: Dilation: 1.5 Effacement (%): 10 Cervical Position: Middle Station: -3 Presentation: Vertex Exam by:: Dr Pilar Plate   A&P: 39 y.o. G2P1001 [redacted]w[redacted]d here for IOL for cHTN w/ SIPR w/ SF.  #Labor: s/p cytotec x 2. FB placed. Continue cytotec buccal. Will recheck in 4 hours and consider pit once FP is out. #Pain: IV PRN for now. Epi upon request. #FWB: Cat I #GBS positive #cHTN w/ SIPE w/ SF: on Mag.  SF symptomatic HA, now improved. No severe range BP in past 4 hours.  #Hx of PPH: TXA at delivery #Asthma: albuter PRN #GDMA1: CBGs WNL. Q4 in latent labor, Q 2 in active. #anxiety: consult SW PP.  Matilde Haymaker, MD 11:27 AM

## 2019-12-26 NOTE — Progress Notes (Signed)
Labor Progress Note AVERLY ERICSON is a 39 y.o. G2P1001 at [redacted]w[redacted]d presented for IOL for cHTN w/ SIPE w/ SF.  S: increasing contractions. Mild headache. No vision changes, no RUQ pain.  O:  BP (!) 137/95   Pulse 87   Temp 97.7 F (36.5 C) (Oral)   Resp 19   Ht 5\' 5"  (1.651 m)   Wt 135.6 kg   LMP 04/03/2019 (Exact Date)   SpO2 98%   BMI 49.76 kg/m  EFM: 125/moderate var/accels, no decels.  CVE: Dilation: 3.5 Effacement (%): 100 Cervical Position: Middle Station: -3 Presentation: Vertex Exam by:: Carmelia Roller CNM   A&P: 39 y.o. G2P1001 [redacted]w[redacted]d here for IOL for cHTN w/ SIPR w/ SF.  #Labor: s/p cytotec x 3. FB in place but likely to fall out very soon. Will hold cytotec for now so that pit can start once FB is out. #Pain: IV PRN for now. Epi upon request. #FWB: Cat I #GBS positive #cHTN w/ SIPE w/ SF: on Mag.  SF symptomatic HA, now improved. No severe range pressures in the past 4 hours.  #Hx of PPH: TXA at delivery #Asthma: albuter PRN #GDMA1: CBGs WNL. Q4 in latent labor, Q 2 in active. #anxiety: consult SW PP.  Matilde Haymaker, MD 3:50 PM

## 2019-12-27 ENCOUNTER — Inpatient Hospital Stay (HOSPITAL_COMMUNITY): Payer: Medicaid Other

## 2019-12-27 ENCOUNTER — Encounter (HOSPITAL_COMMUNITY): Payer: Self-pay | Admitting: Obstetrics & Gynecology

## 2019-12-27 DIAGNOSIS — O1414 Severe pre-eclampsia complicating childbirth: Secondary | ICD-10-CM | POA: Diagnosis not present

## 2019-12-27 DIAGNOSIS — N179 Acute kidney failure, unspecified: Secondary | ICD-10-CM | POA: Diagnosis present

## 2019-12-27 DIAGNOSIS — I10 Essential (primary) hypertension: Secondary | ICD-10-CM | POA: Diagnosis not present

## 2019-12-27 DIAGNOSIS — E8770 Fluid overload, unspecified: Secondary | ICD-10-CM | POA: Diagnosis not present

## 2019-12-27 DIAGNOSIS — J811 Chronic pulmonary edema: Secondary | ICD-10-CM | POA: Diagnosis not present

## 2019-12-27 DIAGNOSIS — R0602 Shortness of breath: Secondary | ICD-10-CM | POA: Diagnosis not present

## 2019-12-27 DIAGNOSIS — N1330 Unspecified hydronephrosis: Secondary | ICD-10-CM | POA: Diagnosis not present

## 2019-12-27 LAB — COMPREHENSIVE METABOLIC PANEL
ALT: 12 U/L (ref 0–44)
ALT: 11 U/L (ref 0–44)
ALT: 12 U/L (ref 0–44)
AST: 19 U/L (ref 15–41)
AST: 18 U/L (ref 15–41)
AST: 19 U/L (ref 15–41)
Albumin: 2.4 g/dL — ABNORMAL LOW (ref 3.5–5.0)
Albumin: 2.2 g/dL — ABNORMAL LOW (ref 3.5–5.0)
Albumin: 2.1 g/dL — ABNORMAL LOW (ref 3.5–5.0)
Alkaline Phosphatase: 107 U/L (ref 38–126)
Alkaline Phosphatase: 123 U/L (ref 38–126)
Alkaline Phosphatase: 108 U/L (ref 38–126)
Anion gap: 14 (ref 5–15)
Anion gap: 14 (ref 5–15)
Anion gap: 14 (ref 5–15)
BUN: 7 mg/dL (ref 6–20)
BUN: 10 mg/dL (ref 6–20)
BUN: 11 mg/dL (ref 6–20)
CO2: 19 mmol/L — ABNORMAL LOW (ref 22–32)
CO2: 18 mmol/L — ABNORMAL LOW (ref 22–32)
CO2: 19 mmol/L — ABNORMAL LOW (ref 22–32)
Calcium: 7.9 mg/dL — ABNORMAL LOW (ref 8.9–10.3)
Calcium: 7.7 mg/dL — ABNORMAL LOW (ref 8.9–10.3)
Calcium: 7.5 mg/dL — ABNORMAL LOW (ref 8.9–10.3)
Chloride: 98 mmol/L (ref 98–111)
Chloride: 99 mmol/L (ref 98–111)
Chloride: 101 mmol/L (ref 98–111)
Creatinine, Ser: 2.08 mg/dL — ABNORMAL HIGH (ref 0.44–1.00)
Creatinine, Ser: 2.46 mg/dL — ABNORMAL HIGH (ref 0.44–1.00)
Creatinine, Ser: 2.24 mg/dL — ABNORMAL HIGH (ref 0.44–1.00)
GFR calc Af Amer: 34 mL/min — ABNORMAL LOW (ref >60)
GFR calc Af Amer: 28 mL/min — ABNORMAL LOW (ref >60)
GFR calc Af Amer: 31 mL/min — ABNORMAL LOW (ref >60)
GFR calc non Af Amer: 30 mL/min — ABNORMAL LOW (ref >60)
GFR calc non Af Amer: 24 mL/min — ABNORMAL LOW (ref >60)
GFR calc non Af Amer: 27 mL/min — ABNORMAL LOW (ref >60)
Glucose, Bld: 86 mg/dL (ref 70–99)
Glucose, Bld: 87 mg/dL (ref 70–99)
Glucose, Bld: 107 mg/dL — ABNORMAL HIGH (ref 70–99)
Potassium: 4.7 mmol/L (ref 3.5–5.1)
Potassium: 4.7 mmol/L (ref 3.5–5.1)
Potassium: 4.3 mmol/L (ref 3.5–5.1)
Sodium: 131 mmol/L — ABNORMAL LOW (ref 135–145)
Sodium: 131 mmol/L — ABNORMAL LOW (ref 135–145)
Sodium: 134 mmol/L — ABNORMAL LOW (ref 135–145)
Total Bilirubin: 0.7 mg/dL (ref 0.3–1.2)
Total Bilirubin: 0.6 mg/dL (ref 0.3–1.2)
Total Bilirubin: 0.9 mg/dL (ref 0.3–1.2)
Total Protein: 5.8 g/dL — ABNORMAL LOW (ref 6.5–8.1)
Total Protein: 5.8 g/dL — ABNORMAL LOW (ref 6.5–8.1)
Total Protein: 5.7 g/dL — ABNORMAL LOW (ref 6.5–8.1)

## 2019-12-27 LAB — MAGNESIUM
Magnesium: 11.4 mg/dL (ref 1.7–2.4)
Magnesium: 10.6 mg/dL (ref 1.7–2.4)
Magnesium: 7.8 mg/dL (ref 1.7–2.4)

## 2019-12-27 LAB — PROTEIN / CREATININE RATIO, URINE
Creatinine, Urine: 80.09 mg/dL
Protein Creatinine Ratio: 0.69 mg/mg{Cre} — ABNORMAL HIGH (ref 0.00–0.15)
Total Protein, Urine: 55 mg/dL

## 2019-12-27 LAB — CBC
HCT: 38.5 % (ref 36.0–46.0)
HCT: 34.7 % — ABNORMAL LOW (ref 36.0–46.0)
Hemoglobin: 12.3 g/dL (ref 12.0–15.0)
Hemoglobin: 11.3 g/dL — ABNORMAL LOW (ref 12.0–15.0)
MCH: 29.3 pg (ref 26.0–34.0)
MCH: 29.5 pg (ref 26.0–34.0)
MCHC: 31.9 g/dL (ref 30.0–36.0)
MCHC: 32.6 g/dL (ref 30.0–36.0)
MCV: 91.7 fL (ref 80.0–100.0)
MCV: 90.6 fL (ref 80.0–100.0)
Platelets: 297 10*3/uL (ref 150–400)
Platelets: 267 10*3/uL (ref 150–400)
RBC: 4.2 MIL/uL (ref 3.87–5.11)
RBC: 3.83 MIL/uL — ABNORMAL LOW (ref 3.87–5.11)
RDW: 13.7 % (ref 11.5–15.5)
RDW: 13.8 % (ref 11.5–15.5)
WBC: 21.4 10*3/uL — ABNORMAL HIGH (ref 4.0–10.5)
WBC: 22.5 10*3/uL — ABNORMAL HIGH (ref 4.0–10.5)
nRBC: 0 % (ref 0.0–0.2)
nRBC: 0 % (ref 0.0–0.2)

## 2019-12-27 LAB — GLUCOSE, CAPILLARY
Glucose-Capillary: 106 mg/dL — ABNORMAL HIGH (ref 70–99)
Glucose-Capillary: 70 mg/dL (ref 70–99)
Glucose-Capillary: 78 mg/dL (ref 70–99)
Glucose-Capillary: 80 mg/dL (ref 70–99)
Glucose-Capillary: 81 mg/dL (ref 70–99)
Glucose-Capillary: 86 mg/dL (ref 70–99)
Glucose-Capillary: 99 mg/dL (ref 70–99)

## 2019-12-27 LAB — SODIUM, URINE, RANDOM: Sodium, Ur: 75 mmol/L

## 2019-12-27 LAB — CREATININE, URINE, RANDOM: Creatinine, Urine: 54.01 mg/dL

## 2019-12-27 MED ORDER — FUROSEMIDE 10 MG/ML IJ SOLN
INTRAMUSCULAR | Status: AC
  Administered 2019-12-27: 40 mg
  Filled 2019-12-27: qty 2

## 2019-12-27 MED ORDER — FAMOTIDINE IN NACL 20-0.9 MG/50ML-% IV SOLN
20.0000 mg | Freq: Two times a day (BID) | INTRAVENOUS | Status: DC | PRN
  Administered 2019-12-27: 20 mg via INTRAVENOUS
  Filled 2019-12-27: qty 50

## 2019-12-27 MED ORDER — FUROSEMIDE 10 MG/ML IJ SOLN
80.0000 mg | Freq: Once | INTRAMUSCULAR | Status: AC
  Administered 2019-12-27: 80 mg via INTRAVENOUS
  Filled 2019-12-27: qty 8

## 2019-12-27 MED ORDER — FUROSEMIDE 10 MG/ML IJ SOLN
20.0000 mg | Freq: Once | INTRAMUSCULAR | Status: AC
  Administered 2019-12-27: 20 mg via INTRAVENOUS

## 2019-12-27 MED ORDER — FUROSEMIDE 10 MG/ML IJ SOLN
INTRAMUSCULAR | Status: AC
  Filled 2019-12-27: qty 2

## 2019-12-27 MED ORDER — FUROSEMIDE 10 MG/ML IJ SOLN: 40.0000 mg | Freq: Once | INTRAMUSCULAR | Status: DC

## 2019-12-27 MED ORDER — FUROSEMIDE 10 MG/ML IJ SOLN
40.0000 mg | Freq: Once | INTRAMUSCULAR | Status: AC
  Administered 2019-12-27: 40 mg via INTRAVENOUS
  Filled 2019-12-27: qty 4

## 2019-12-27 NOTE — Progress Notes (Signed)
Patient ID: [name], female   DOB: 04-Sep-1980, 39 y.o.   MRN: [id number] Discussed rising Cr, minimal decrease in Magnesium level, elevated WBC of 21, dropping BPs and few late decels that resolved w/ position change w/ Dr. Elonda Husky. He has been discussing pt w/ Nephrologist. Both are coming to [facility] to see pt and determine POC.  Tamala Julian, [place], [place] 12/27/2019 4:15 PM

## 2019-12-27 NOTE — Progress Notes (Addendum)
Labor Progress Note [name] is a 39 y.o. G2P1001 at [redacted]w[redacted]d presented for IOL for cHTN w/ SIPE w/ SF.  S: Doing well without complaints.  O:  BP 106/71   Pulse 74   Temp 98 F (36.7 C) (Oral)   Resp 16   Ht 5\' 5"  (1.651 m)   Wt 135.6 kg   LMP 04/03/2019 (Exact Date)   SpO2 98%   BMI 49.76 kg/m  EFM: 120/min-moderate var/pos accels, no decels. Toco: irritable  CVE: Dilation: 4 Effacement (%): 50 Cervical Position: Middle Station: -2 Presentation: Vertex Exam by:: Dr. Sylvester Harder    A&P: 39 y.o. G2P1001 [redacted]w[redacted]d here for IOL for cHTN w/ SIPR w/ SF.  #Labor: S/p FB cytotec x3. Pitocin initiated but then patient with no cervical change x4 hours, stopped pit. Repeat cytotec administered at that time (~2000 on 8/28). Patient with minimal change and cervical thickness so cytotec re-dosed x2. Patient with bulging bag on exam, AROM performed @~0348 and IUPC inserted. Will initiate pit at this time, continue to augment as appropriate. #Pain: PRN #FWB: Cat I #GBS positive, adequate ppx #cHTN w/ SIPE w/ SF: on Mag. Asymptomatic. BP well controlled, intermittently low.  #Hx of PPH: TXA at delivery #Asthma: albuterol PRN #GDMA1: CBGs WNL. Q4 in latent labor, Q 2 in active. #anxiety: consult SW PP.  Arrie Senate, MD 3:54 AM

## 2019-12-27 NOTE — Progress Notes (Signed)
CRITICAL VALUE ALERT  Critical Value:  11.4 Mg  Date & Time Notied: 12/27/19 0949  Provider Notified: Dr. Elonda Husky  Orders Received/Actions taken: keep Mag off for total 4hrs and give 40mg  of IV lasix

## 2019-12-27 NOTE — Progress Notes (Signed)
Labor Progress Note [name] is a 39 y.o. G2P1001 at [redacted]w[redacted]d presented for IOL for cHTN w/ SIPE w/ SF.  S: Will assess patient in 1 hour for cervical exam.   O:  BP 109/60   Pulse 89   Temp 98.1 F (36.7 C) (Oral)   Resp 17   Ht 5\' 5"  (1.651 m)   Wt 135.6 kg   LMP 04/03/2019 (Exact Date)   SpO2 97%   BMI 49.76 kg/m  EFM: baseline 120bpm/mod variability/+accels/no decels Toco: q5 minutes  CVE: Dilation: 6 Effacement (%): 90 Cervical Position: Middle Station: 0 Presentation: Vertex Exam by:: Varney Baas, RN   A&P: 40 y.o. G2P1001 [redacted]w[redacted]d presented for IOL for cHTN w/ SIPE w/ SF. #Labor: Continue to increase pitocin as able, IUPC in place. #cHTN w/ SIPE w/ SF: Patient intermittently hypotensive, last BP 109/60. Mag held. Repeat labs demonstrated improvement in Mg 11.4>>10.6>>7.8. Cr also improved from 2.46>>2.24. S/p lasix 40mg /20mg /40mg . Will administer 120 mg IV lasix now. Continue to monitor UOP and symptoms. #Pain: Epidural.  #FWB: Cat I #GBS positive, PCN #Hx of of PPH: TXA at delivery #GDMA1:  Normal CBGs in past 4 hours.  Continue Q2 hour checks. #GERD: famotidine IV ordered  Arrie Senate, MD 10:16 PM

## 2019-12-27 NOTE — Progress Notes (Signed)
CRITICAL VALUE ALERT  Critical Value:  10.6 Mg  Date & Time Notied: 12/27/19 1602  Provider Notified: Dr. Elonda Husky  Orders Received/Actions taken: Continue pitocin, give lasix 40mg  IV.   Mahalia Longest

## 2019-12-27 NOTE — Progress Notes (Signed)
Labor Progress Note [name] is a 39 y.o. G2P1001 at [redacted]w[redacted]d presented for IOL for cHTN w/ SIPE w/ SF.  S: Called to bedside by RN as patient is having a burning sensation in her mouth and chest. No sharp pains. No associated SOB. Feels weak.  O:  BP 124/86   Pulse 85   Temp 97.6 F (36.4 C) (Oral)   Resp 18   Ht 5\' 5"  (1.651 m)   Wt 135.6 kg   LMP 04/03/2019 (Exact Date)   SpO2 98%   BMI 49.76 kg/m  EFM: 115/min variability/no accels, no decels. Toco: q4 minutes  CVE: Dilation: 4 Effacement (%): 50 Cervical Position: Middle Station: -2 Presentation: Vertex Exam by:: Dr. Sylvester Harder   Cardiac: RRR, no MRG Pulm: CTAB, no rales, wheezes, rhonchi  A&P: 39 y.o. G2P1001 [redacted]w[redacted]d here for IOL for cHTN w/ SIPR w/ SF.  #Labor: S/p FB cytotec x3. Pitocin initiated but then patient with no cervical change x4 hours, stopped pit. Repeat cytotec administered at that time (~2000 on 8/28). Patient with minimal change and cervical thickness so cytotec re-dosed x2. S/p AROM/IUPC. Currently on pit, continue to increase as able. #Pain: PRN #FWB: Cat 2 #GBS positive, adequate ppx #cHTN w/ SIPE w/ SF: on Mag. Discussed symptoms are likely due to duration of time on magnesium. Exam unremarkable. BP well controlled, intermittently low.  #Hx of PPH: TXA at delivery #Asthma: albuterol PRN #GDMA1: CBGs WNL. Q4 in latent labor, Q 2 in active. #anxiety: consult SW PP. #Decreased UOP: 38mL UOP since 2000 on 8/28 (10 hours), 9cc/hr. RN believes foley catheter may be dislodged. Bladder scan demonstrated 50cc. LR 1 L bolus administered. Foley catheter replaced. Continue to monitor.  Arrie Senate, MD 5:40 AM

## 2019-12-27 NOTE — Progress Notes (Signed)
I spoke to Dr Jonnie Finner, MD, Nephrology, regarding the patient's still increasing creatinine BP well controlled aND HAS BEEN AND NO SPECIFIC RENAL INSULT IDENTIFIED cERTAINLY PRE ECLAMPSIA WILL NEGATIVELY IMPACT HER RENAL FUNCTION BUT WITHOUT ANOTHER INSULT, ct DYE, HYPOVOLEMIA, SOME OTHER PRE RENAL DISRUPTION, AND ONGOING GOOD bp CONTROL I am at a loss to explain this degree of renal injury since admission  Unable to decrease the magnesium level despite magnesium being off now for 7 plus hours which is negatively impacting our ability to get the patient in adequate labor which in turn is negativelyu impacting time to delivery  The baby has had a few lates which have resolved but with such a high magnesium level I would expect limited fetal heart rate variability.  I ordered another 40 mg of lasix at 1551 to decrease the magnesium which may furether negatively impact the creatinine.  Since her CXR and general management course would suggest she would not be hypovolemic the lasix is still the proper management step at this juncture.  She has put out about 200 cc of urine over the past 30 minutes since the lasix which is overall positive but again may negatively impact the creatinine  Her cervix remains 5 cm although now she is 100% effaced and 0 station.  The baby is relatively small and with adequatelyt labor would deliver shortly but I may not be able to effect that due to the tocolytic effect of the hypermagnesemia.  Her renal function deterioration is significantly out of proportion to her pre eclampsia course and status, especially her BP management, and without another/additional etiology for negative renal impact, I am unable to explain this degree of AKI by her pre eclampsia alone.  However, for the time being, that will be the working diagnosis/etiology for her renal function worsening. I did mention the possible use of renal dose dopamine as a possible trial to see if that improves her renal  function and will await Dr Barkley Bruns further thoughts.  Florian Buff, MD 12/27/2019 4:43 PM

## 2019-12-27 NOTE — Progress Notes (Signed)
[name] is a 39 y.o. G2P1001 at 109w2d.  Subjective: Reports rectal pressure.  Objective: BP (!) 112/57   Pulse 75   Temp 97.6 F (36.4 C) (Oral)   Resp (!) 22   Ht 5\' 5"  (1.651 m)   Wt 135.6 kg   LMP 04/03/2019 (Exact Date)   SpO2 100%   BMI 49.76 kg/m    FHT:  FHR: 110 bpm, variability: min-mod,  accelerations:  10x10,  decelerations:  Rare varibales UC:   Q 2-7 minutes, mod, MVU's 100-160 Dilation: 4 Effacement (%): 70 Cervical Position: Middle Station: -2 Presentation: Vertex Exam by:: Milbert Coulter  Labs: Results for orders placed or performed during the hospital encounter of 12/25/19 (from the past 24 hour(s))  Glucose, capillary     Status: None   Collection Time: 12/26/19 11:10 AM  Result Value Ref Range   Glucose-Capillary 84 70 - 99 mg/dL  Glucose, capillary     Status: None   Collection Time: 12/26/19  3:12 PM  Result Value Ref Range   Glucose-Capillary 89 70 - 99 mg/dL  CBC     Status: Abnormal   Collection Time: 12/26/19  4:47 PM  Result Value Ref Range   WBC 12.6 (H) 4.0 - 10.5 K/uL   RBC 4.10 3.87 - 5.11 MIL/uL   Hemoglobin 12.1 12.0 - 15.0 g/dL   HCT 36.6 36 - 46 %   MCV 89.3 80.0 - 100.0 fL   MCH 29.5 26.0 - 34.0 pg   MCHC 33.1 30.0 - 36.0 g/dL   RDW 13.4 11.5 - 15.5 %   Platelets 300 150 - 400 K/uL   nRBC 0.0 0.0 - 0.2 %  Glucose, capillary     Status: None   Collection Time: 12/26/19  7:16 PM  Result Value Ref Range   Glucose-Capillary 77 70 - 99 mg/dL  Glucose, capillary     Status: None   Collection Time: 12/27/19 12:17 AM  Result Value Ref Range   Glucose-Capillary 70 70 - 99 mg/dL  Glucose, capillary     Status: None   Collection Time: 12/27/19  4:52 AM  Result Value Ref Range   Glucose-Capillary 80 70 - 99 mg/dL  Comprehensive metabolic panel     Status: Abnormal   Collection Time: 12/27/19  7:00 AM  Result Value Ref Range   Sodium 131 (L) 135 - 145 mmol/L   Potassium 4.7 3.5 - 5.1 mmol/L   Chloride 98 98 - 111  mmol/L   CO2 19 (L) 22 - 32 mmol/L   Glucose, Bld 86 70 - 99 mg/dL   BUN 7 6 - 20 mg/dL   Creatinine, Ser 2.08 (H) 0.44 - 1.00 mg/dL   Calcium 7.9 (L) 8.9 - 10.3 mg/dL   Total Protein 5.8 (L) 6.5 - 8.1 g/dL   Albumin 2.4 (L) 3.5 - 5.0 g/dL   AST 19 15 - 41 U/L   ALT 12 0 - 44 U/L   Alkaline Phosphatase 107 38 - 126 U/L   Total Bilirubin 0.7 0.3 - 1.2 mg/dL   GFR calc non Af Amer 30 (L) >60 mL/min   GFR calc Af Amer 34 (L) >60 mL/min   Anion gap 14 5 - 15  Magnesium     Status: Abnormal   Collection Time: 12/27/19  7:00 AM  Result Value Ref Range   Magnesium 11.4 (HH) 1.7 - 2.4 mg/dL  Glucose, capillary     Status: None   Collection Time: 12/27/19  8:23 AM  Result Value Ref Range   Glucose-Capillary 81 70 - 99 mg/dL    Assessment / Plan: [redacted]w[redacted]d week IUP Labor: Early, w/ inadequate uterine activity likely due to higher than therapeutic Magnesium level. Mag currently off. Will continue increasing pitocin PRN to try to achieve adequate contractions.  Fetal Wellbeing:  Category II, but overall reassuring. Now w/ periods of mod variability and + scalp stim.  Pain Control:  Epidural  Anticipated MOD:  Uncertain.  CHTN w/ SIP: BPs stable. Higher than therapeutic Magnesium  AKI possibly 2/2 SIADH in labor. Currently getting Lasix and having increase UOP (175 last hour). Dr. Elonda Husky aware. Repeat labs at 1300.  A1GDM: Stable CBGs  Tamala Julian, [place], [place] 12/27/2019 10:39 AM

## 2019-12-27 NOTE — Consult Note (Signed)
Renal Service Consult Note [facility]  [name] 12/27/2019 Sol Blazing Requesting Physician:  Dr Elonda Husky  Reason for Consult:  AKI  HPI: The patient is a 39 y.o. year-old w/ hx of obesity, HTN who was admitted w/ IUP at 77w0don 12/25/19 due to superimposed preeclampsia w/ severe features (headaches, pedal edema). By LMP estimated delivery date would be 01/08/20.  UKoreashowed normal fetal anatomy, 37w4e. Plan was to start induction w/ cytotec. BP's were 1115'Zsystolic, IV Mg was started. BP's were high at 1208 1022systolic on 83/36 BP's declined on 8/28 to 120's- 130's w/ lowest BP's 110's. Creat on 8/27 was 0.77. UOP 8/28 was 3100 cc w/ 2500 cc in.  Next lab was 7 am today 8/29 and creat had bumped to 2.08.  BP's were 110's to 140's this am. UOP had dropped off. She was progressing poorly. Mg was held this am. She rec'd fluid bolus early this am. Around 9 am she felt SOB and was put on O2, CXR showed early edema/ CHF type picture. Pt very tired and low energy, Mg was 11 , very elevated felt to be due to AKI. Was given 20 mg IV lasix at 9 am w/o response, then 486mat 10 am w/ good response.  She got a 2nd dose of IV 408mt 3:50 this afternoon. Repeat creat this afternoon was up to 2.4. Asked to see for AKI.   Pt seen in room.  Off of O2, was SOB this am but no longer feels SOB.  Foley in placed, 1.2 L UOP today. I/O today however are 4.4 L in and 1.3L out.  I/O since admit are 7L in and 4.4 L out.  Pt denies any hx kidney disease.  No active CP, abd pain, +some ankle swelling bilat.    ROS  denies CP  no joint pain   no HA  no blurry vision  no rash  no diarrhea  no nausea/ vomiting  foley cath in place  Past Medical History  Past Medical History:  Diagnosis Date  . Allergy    banana/pineapple  . Anemia    post pregnancy  . Anxiety   . Asthma   . Autoimmune disease, not elsewhere classified(279.49) 2013   Non specific- [facility], abnormal Bone  Marrow signal  . Breast lump in female 11/28/2012   Has tender round mass at 10-11 0'clock right breast 4 finger breaths from [place] will US US Breast mass, right 08/18/2012  . Chlamydia   . Chronic breast pain 05/31/2015  . Dysmenorrhea 03/23/2014  . Dyspareunia 01/05/2014  . Fibroids, intramural 08/24/2015  . GERD (gastroesophageal reflux disease)   . Gonorrhea   . Headache(784.0)   . History of abnormal cervical Pap smear 04/02/2014  . Hypertension   . LLQ abdominal tenderness 05/31/2015  . Menorrhagia 01/05/2014  . Neuromuscular disorder (HCCStarks  lower back and bilat leg pain  . Obesity   . Other and unspecified ovarian cyst 01/12/2014  . Polyclonal gammopathy 07/26/2013   Insignificant  . Recurrent upper respiratory infection (URI)   . Scoliosis   . Urticaria   . Vaginal irritation 09/30/2014  . Vaginal Pap smear, abnormal    Past Surgical History  Past Surgical History:  Procedure Laterality Date  . ADENOIDECTOMY    . DILATION AND CURETTAGE OF UTERUS    . SHOULDER SURGERY Right 03/10/2019  . TONSILLECTOMY     Family History  Family History  Problem Relation  Age of Onset  . Hypertension Mother   . Diabetes Mother   . Heart disease Mother   . Diabetes Father   . Cancer Paternal Aunt        breast  . Hypertension Maternal Grandmother   . Diabetes Son        pre diabetic  . Other Son        overactive bladder  . COPD Maternal Aunt   . Leukemia Maternal Aunt   . Allergic rhinitis Neg Hx   . Angioedema Neg Hx   . Asthma Neg Hx   . Atopy Neg Hx   . Eczema Neg Hx   . Immunodeficiency Neg Hx   . Urticaria Neg Hx    Social History  reports that she has never smoked. She has never used smokeless tobacco. She reports that she does not drink alcohol and does not use drugs. Allergies  Allergies  Allergen Reactions  . Banana Rash  . Pineapple Rash   Home medications Prior to Admission medications   Medication Sig Start Date End Date Taking? Authorizing Provider   Accu-Chek Softclix Lancets lancets Use as instructed to check blood sugar 4 times daily Patient taking differently: 1 each by Other route See admin instructions. Use as instructed to check blood sugar 4 times daily 10/12/19  Yes Roma Schanz, CNM  aspirin (ASPIRIN CHILDRENS) 81 MG chewable tablet Chew 2 tablets (162 mg total) by mouth daily. 07/01/19  Yes Serita Grammes D, CNM  Blood Glucose Monitoring Suppl (ACCU-CHEK GUIDE ME) w/Device KIT 1 each by Does not apply route 4 (four) times daily. 10/12/19  Yes Roma Schanz, CNM  Blood Pressure Monitoring (BLOOD PRESSURE CUFF) MISC 1 Device by Does not apply route once a week. To check pressure 3-5 times weekly Patient taking differently: 1 Device by Does not apply route See admin instructions. To check pressure 3-5 times weekly 05/13/19  Yes Jonnie Kind, MD  glucose blood (ACCU-CHEK GUIDE) test strip Use as instructed to check blood sugar 4 times daily Patient taking differently: 1 each by Other route See admin instructions. Use as instructed to check blood sugar 4 times daily 10/12/19  Yes Roma Schanz, CNM  labetalol (NORMODYNE) 200 MG tablet Take 1 tablet (200 mg total) by mouth 2 (two) times daily. 12/16/19  Yes Jonnie Kind, MD  Prenatal Vit-DSS-Fe Cbn-FA (PRENATAL AD PO) Take 1 tablet by mouth daily.    Yes [provider]  albuterol (PROVENTIL HFA) 108 (90 Base) MCG/ACT inhaler Use 4 puffs every 4-6 hours as needed for cough or wheeze. Patient not taking: Reported on 12/18/2019 09/18/19   Valentina Shaggy, MD  Epinastine HCl 0.05 % ophthalmic solution Place 1 drop into both eyes as needed.  Patient not taking: Reported on 12/18/2019    [provider]  famotidine (PEPCID) 20 MG tablet Take 1 tablet (20 mg total) by mouth 2 (two) times daily. Patient not taking: Reported on 12/27/2019 05/22/19   Valentina Shaggy, MD  flunisolide (NASALIDE) 25 MCG/ACT (0.025%) SOLN Place 2 sprays into the nose 2 (two)  times daily. Patient not taking: Reported on 12/27/2019 05/22/19   Valentina Shaggy, MD  zolpidem (AMBIEN) 10 MG tablet TAKE (1) TABLET BY MOUTH AT BEDTIME. Patient not taking: No sig reported 02/28/16 11/12/18  [name] F, MD     Vitals:   12/27/19 1701 12/27/19 1731 12/27/19 1755 12/27/19 1800  BP:  (!) 102/49  110/64  Pulse: 81 89  89  Resp: 16 16  18   Temp: (!) 97.5 F (36.4 C)     TempSrc: Axillary     SpO2: 100%  100% 100%  Weight:      Height:       Exam Gen alert, lying on her L side, no resp distress, no O2 No rash, cyanosis or gangrene Sclera anicteric, throat clear  No jvd or bruits, neck veins appear flat at 30 deg Chest clear bilat to bases, no rales or wheezing RRR no MRG Abd large abdomen d/t IUP,  ntnd no mass or ascites +bs GU foley in place draining clear blood-tinged urine MS no joint effusions or deformity Ext 1+ bilat pitting ankel/ pretib edema, L > R Neuro is alert, Ox 3 , nf    Home meds:  - asa 162 qd/ labetalol 200 bid  - pepcid 20 bid  - prn's/ vitamins/ supplements      IV meds:     IV Lasix 67m 9 am > 437m10 am > 40 mg 3:50pm all today     IV pepcid, IV LR, IV MgSO4, IV pitocin, IV PCN, IV fentanyl, IV zofran       Urine sediment (today, 8/29, undersigned) >  - dipstick 3-4+ Hgb, prot neg, neg LE/ nit  - sediment > 30- 50 monomorphic rbc's per HPF, no WBC's/ no bact/ no casts/ no crystals    8/29 5:30pm  > UNa 75, U Cr 54 (sent after IV lasix)   Assessment/ Plan: 1. Acute renal failure - AKI in setting of vol overload, pulm edema responding well to IV lasix now, underlying hx of chronic HTN and superimposed preeclampsia.  Lacking some typical signs of preeclampsia (not proteinuric, normal LFT's) but has had HTN problems here. Get renal [place]/o obstruction. Urine lytes were after lasix IV. Urine sediment is benign and dip shows no proteinuria.  For now would treat as hemodynamic ARF w/ acute CHF (pulm edema) and continue lasix  IV (OB-GYN to dose), 40- 80 mg.  Is still up several liters since admission so will dc the LR at 150 / hr. Follow resp status, get f/u CXR in am. Get echo, troponin. Get f/u labs in am. Frequent exams. Will follow.  2. Volume - lungs clear, still some ankle swelling bilat this afternoon.  3. IUP - per OB 4. Hypermagnesemia - due to Mg loading and acute renal insufficiency      RoKelly SplinterMD 12/27/2019, 6:31 PM  Recent Labs  Lab 12/26/19 1647 12/27/19 1350  WBC 12.6* 21.4*  HGB 12.1 12.3   Recent Labs  Lab 12/27/19 0700 12/27/19 1350  K 4.7 4.7  BUN 7 10  CREATININE 2.08* 2.46*  CALCIUM 7.9* 7.7*

## 2019-12-27 NOTE — Progress Notes (Signed)
Labor Progress Note [name] is a 39 y.o. G2P1001 at [redacted]w[redacted]d presented for IOL for cHTN w/ SIPE w/ SF.  S: Feels very tired and low energy. Some SOB.   O:  BP (!) 138/100   Pulse 91   Temp (!) 96.5 F (35.8 C) (Axillary)   Resp 20   Ht 5\' 5"  (1.651 m)   Wt 135.6 kg   LMP 04/03/2019 (Exact Date)   SpO2 98%   BMI 49.76 kg/m  EFM: 110/minimal-moderate var/now accels. Occasional variable decels.  Contractions every 8-10.  CVE: Dilation: 4 Effacement (%): 50 Cervical Position: Middle Station: -2 Presentation: Vertex Exam by:: Dr. Sylvester Harder    A&P: 39 y.o. G2P1001 [redacted]w[redacted]d here for IOL for cHTN w/ SIPE w/ SF.  #Labor: Progressing poorly on pitocin and mag.  Will cut mag off for one hour and give one dose of lasix.  Follow up mag level (suspect it is elevated) plan to restart mag in one hour.   #cHTN w/ SIPE w/ SF: hold mag for 1 hour.  Give lasix.  F/u chest xray. #Pain: Epidural.  #FWB: Cat II. Likely decreased variability at times due to mag.  Will continue to monitor closely. #GBS positive, PCN #Hx of of PPH: TXA at delivery #GDMA1:  Normal CBGs in past 4 hours.  Continue Q2 hour checks.     Matilde Haymaker, MD 9:00 AM

## 2019-12-27 NOTE — Progress Notes (Addendum)
Labor Progress Note [name] is a 39 y.o. G2P1001 at [redacted]w[redacted]d presented for IOL for cHTN w/ SIPE w/ SF.  S: Mild subjective improvement in the past 4-6 hours.  No SOB or chest pain. She does not throat discomfort that feels like an itching or burning sensation.  O:  BP (!) 112/52   Pulse 82   Temp 97.6 F (36.4 C) (Oral)   Resp 18   Ht 5\' 5"  (1.651 m)   Wt 135.6 kg   LMP 04/03/2019 (Exact Date)   SpO2 99%   BMI 49.76 kg/m  EFM: 120/mild var with occasional periods of moderate var/no accels, occasional late decels.  CVE: Dilation: 5 Effacement (%): 80 Cervical Position: Middle Station: -2 Presentation: Vertex Exam by:: Milbert Coulter   A&P: 39 y.o. G2P1001 [redacted]w[redacted]d here for IOL for cHTN w/ SIPE w/ SF.  #Labor: mild progression of cervical dilation and effacement in the past 4 hours. Has been off Mag for 4 hours now while continuing pit. Some subjective improvement of her symptoms.   #cHTN w/ SIPE w/ SF: Continue to hold mag until labs are back.  No severe range pressures in the past 4 hours. Creatinine increasing from 2.0 to 2.4. AST, ALT unchanged. #Pain: Epidural.  #FWB: Cat II. Low variability likely due to mag.  Occasional late decels.  Will proceed with caution and decrease pit if needed. #GBS positive, PCN #Hx of of PPH: TXA at delivery #GDMA1:  Normal CBGs in past 4 hours.  Continue Q2 hour checks. #GERD: famotidine IV ordered  Most recent CMP and tracing reviewed with Dr. Elonda Husky at the time of this note. No changes to the plan at this time.   Matilde Haymaker, MD 3:18 PM

## 2019-12-28 ENCOUNTER — Encounter (HOSPITAL_COMMUNITY): Payer: Self-pay | Admitting: Obstetrics & Gynecology

## 2019-12-28 DIAGNOSIS — O10913 Unspecified pre-existing hypertension complicating pregnancy, third trimester: Secondary | ICD-10-CM | POA: Diagnosis not present

## 2019-12-28 DIAGNOSIS — I10 Essential (primary) hypertension: Secondary | ICD-10-CM | POA: Diagnosis not present

## 2019-12-28 DIAGNOSIS — N179 Acute kidney failure, unspecified: Secondary | ICD-10-CM | POA: Diagnosis not present

## 2019-12-28 DIAGNOSIS — O113 Pre-existing hypertension with pre-eclampsia, third trimester: Secondary | ICD-10-CM | POA: Diagnosis not present

## 2019-12-28 DIAGNOSIS — O1414 Severe pre-eclampsia complicating childbirth: Secondary | ICD-10-CM | POA: Diagnosis not present

## 2019-12-28 DIAGNOSIS — Z3A38 38 weeks gestation of pregnancy: Secondary | ICD-10-CM | POA: Diagnosis not present

## 2019-12-28 DIAGNOSIS — E8770 Fluid overload, unspecified: Secondary | ICD-10-CM | POA: Diagnosis not present

## 2019-12-28 LAB — CBC
HCT: 32.6 % — ABNORMAL LOW (ref 36.0–46.0)
Hemoglobin: 10.8 g/dL — ABNORMAL LOW (ref 12.0–15.0)
MCH: 29.8 pg (ref 26.0–34.0)
MCHC: 33.1 g/dL (ref 30.0–36.0)
MCV: 90.1 fL (ref 80.0–100.0)
Platelets: 265 10*3/uL (ref 150–400)
RBC: 3.62 MIL/uL — ABNORMAL LOW (ref 3.87–5.11)
RDW: 13.9 % (ref 11.5–15.5)
WBC: 23.5 10*3/uL — ABNORMAL HIGH (ref 4.0–10.5)
nRBC: 0 % (ref 0.0–0.2)

## 2019-12-28 LAB — COMPREHENSIVE METABOLIC PANEL
ALT: 10 U/L (ref 0–44)
AST: 20 U/L (ref 15–41)
Albumin: 2 g/dL — ABNORMAL LOW (ref 3.5–5.0)
Alkaline Phosphatase: 95 U/L (ref 38–126)
Anion gap: 13 (ref 5–15)
BUN: 13 mg/dL (ref 6–20)
CO2: 19 mmol/L — ABNORMAL LOW (ref 22–32)
Calcium: 7.6 mg/dL — ABNORMAL LOW (ref 8.9–10.3)
Chloride: 102 mmol/L (ref 98–111)
Creatinine, Ser: 1.72 mg/dL — ABNORMAL HIGH (ref 0.44–1.00)
GFR calc Af Amer: 43 mL/min — ABNORMAL LOW (ref >60)
GFR calc non Af Amer: 37 mL/min — ABNORMAL LOW (ref >60)
Glucose, Bld: 156 mg/dL — ABNORMAL HIGH (ref 70–99)
Potassium: 3.6 mmol/L (ref 3.5–5.1)
Sodium: 134 mmol/L — ABNORMAL LOW (ref 135–145)
Total Bilirubin: 0.4 mg/dL (ref 0.3–1.2)
Total Protein: 5.3 g/dL — ABNORMAL LOW (ref 6.5–8.1)

## 2019-12-28 LAB — MAGNESIUM
Magnesium: 4 mg/dL — ABNORMAL HIGH (ref 1.7–2.4)
Magnesium: 3.1 mg/dL — ABNORMAL HIGH (ref 1.7–2.4)

## 2019-12-28 MED ORDER — SIMETHICONE 80 MG PO CHEW: 80.0000 mg | CHEWABLE_TABLET | ORAL | Status: DC | PRN

## 2019-12-28 MED ORDER — ONDANSETRON HCL 4 MG/2ML IJ SOLN: 4.0000 mg | INTRAMUSCULAR | Status: DC | PRN

## 2019-12-28 MED ORDER — SENNOSIDES-DOCUSATE SODIUM 8.6-50 MG PO TABS
2.0000 | ORAL_TABLET | ORAL | Status: DC
  Administered 2019-12-29: 2 via ORAL
  Filled 2019-12-28 (×2): qty 2

## 2019-12-28 MED ORDER — DIPHENHYDRAMINE HCL 25 MG PO CAPS: 25.0000 mg | ORAL_CAPSULE | Freq: Four times a day (QID) | ORAL | Status: DC | PRN

## 2019-12-28 MED ORDER — MAGNESIUM HYDROXIDE 400 MG/5ML PO SUSP: 30.0000 mL | ORAL | Status: DC | PRN

## 2019-12-28 MED ORDER — COCONUT OIL OIL: 1.0000 "application " | TOPICAL_OIL | Status: DC | PRN

## 2019-12-28 MED ORDER — WITCH HAZEL-GLYCERIN EX PADS: 1.0000 "application " | MEDICATED_PAD | CUTANEOUS | Status: DC | PRN

## 2019-12-28 MED ORDER — IBUPROFEN 600 MG PO TABS
600.0000 mg | ORAL_TABLET | Freq: Four times a day (QID) | ORAL | Status: DC
  Administered 2019-12-28: 600 mg via ORAL
  Filled 2019-12-28: qty 1

## 2019-12-28 MED ORDER — TETANUS-DIPHTH-ACELL PERTUSSIS 5-2.5-18.5 LF-MCG/0.5 IM SUSP: 0.5000 mL | Freq: Once | INTRAMUSCULAR | Status: DC

## 2019-12-28 MED ORDER — ACETAMINOPHEN 325 MG PO TABS
650.0000 mg | ORAL_TABLET | ORAL | Status: DC | PRN
  Administered 2019-12-28 – 2019-12-30 (×6): 650 mg via ORAL
  Filled 2019-12-28 (×5): qty 2

## 2019-12-28 MED ORDER — BENZOCAINE-MENTHOL 20-0.5 % EX AERO: 1.0000 "application " | INHALATION_SPRAY | CUTANEOUS | Status: DC | PRN

## 2019-12-28 MED ORDER — OXYCODONE HCL 5 MG PO TABS
5.0000 mg | ORAL_TABLET | ORAL | Status: DC | PRN
  Administered 2019-12-28: 5 mg via ORAL
  Filled 2019-12-28: qty 1

## 2019-12-28 MED ORDER — TRANEXAMIC ACID-NACL 1000-0.7 MG/100ML-% IV SOLN
1000.0000 mg | INTRAVENOUS | Status: AC
  Administered 2019-12-28: 1000 mg via INTRAVENOUS

## 2019-12-28 MED ORDER — PRENATAL MULTIVITAMIN CH
1.0000 | ORAL_TABLET | Freq: Every day | ORAL | Status: DC
  Administered 2019-12-28 – 2019-12-30 (×3): 1 via ORAL
  Filled 2019-12-28 (×3): qty 1

## 2019-12-28 MED ORDER — FUROSEMIDE 10 MG/ML IJ SOLN
40.0000 mg | Freq: Once | INTRAMUSCULAR | Status: AC
  Administered 2019-12-28: 40 mg via INTRAVENOUS
  Filled 2019-12-28: qty 4

## 2019-12-28 MED ORDER — TRANEXAMIC ACID-NACL 1000-0.7 MG/100ML-% IV SOLN
INTRAVENOUS | Status: AC
  Filled 2019-12-28: qty 100

## 2019-12-28 MED ORDER — AMLODIPINE BESYLATE 10 MG PO TABS
10.0000 mg | ORAL_TABLET | Freq: Every day | ORAL | Status: DC
  Administered 2019-12-28 – 2019-12-30 (×3): 10 mg via ORAL
  Filled 2019-12-28 (×3): qty 1

## 2019-12-28 MED ORDER — DIBUCAINE (PERIANAL) 1 % EX OINT: 1.0000 "application " | TOPICAL_OINTMENT | CUTANEOUS | Status: DC | PRN

## 2019-12-28 MED ORDER — ONDANSETRON HCL 4 MG PO TABS: 4.0000 mg | ORAL_TABLET | ORAL | Status: DC | PRN

## 2019-12-28 NOTE — Progress Notes (Signed)
Post Partum Day 0 Subjective: no complaints and no headache visual changes or SOB Feels good, sore Objective: Blood pressure 125/71, pulse 85, temperature 98.8 F (37.1 C), temperature source Oral, resp. rate 16, height 5\' 5"  (1.651 m), weight 135.6 kg, last menstrual period 04/03/2019, SpO2 98 %, unknown if currently breastfeeding.  Physical Exam:  General: alert, cooperative and no distress Lochia: appropriate Uterine Fundus: firm Incision:  DVT Evaluation: No evidence of DVT seen on physical exam.   Intake/Output Summary (Last 24 hours) at 12/28/2019 0752 Last data filed at 12/28/2019 0715 Gross per 24 hour  Intake 5684.71 ml  Output 3627 ml  Net 2057.71 ml    Output excellent since delivery Recent Labs    12/27/19 2044 12/28/19 0546  HGB 11.3* 10.8*  HCT 34.7* 32.6*    Assessment/Plan: Cancel BTL Give another dose of lasix this am, 40 mg since Cr has dropped Restart norvasc 10 Not restarting magnesium unless she develops severe features   LOS: 3 days   Florian Buff 12/28/2019, 7:51 AM

## 2019-12-28 NOTE — Lactation Note (Addendum)
This note was copied from a baby's chart. Lactation Consultation Note  Patient Name: [name] YSHUO'H Date: 12/28/2019   P2, Baby 6 hours old.  Mother wants to breastfeed, formula feed and pump.  Baby is < 6lbs. Set up DEBP.  Reviewed LPI feeding policy, cleaning and milk storage and hand expression.  Provided lactation brochure for resources and phone number for questions.  Mother has personal DEBP at home. Unsure of brand.  Baby sleeping after recently receiving bottle of formula.  Discussed pumping frequency.   Plan:  1. Offer breast when baby cues that he/she is hungry, or awaken baby for feeding at 3 hrs. 2.  Breast feed baby, asking for help prn.  Limit to 30 mins so not to overtire baby. 3. If baby does not latch after 10 min of attempt - give supplemental breastmilk/formula.  Slow flow nipple bottle is an option.  4.  Pump both breasts 15-20 minutes on initiation setting, adding breast massage and hand expression to collect as much colostrum as possible to feed baby. 5.  Feed baby 5-10 ml EBM+/formula after breastfeeding per LPTI volume guidelines increasing per day of life and as baby desires.         Maternal Data    Feeding Feeding Type: Bottle Fed - Formula Nipple Type: Nfant Slow Flow (purple)  LATCH Score                   Interventions    Lactation Tools Discussed/Used     Consult Status      Carlye Grippe 12/28/2019, 10:40 AM

## 2019-12-28 NOTE — Discharge Summary (Signed)
Postpartum Discharge Summary  Date of Service updated 12/30/2019     Patient Name: [name] DOB: Apr 16, 1981 MRN: [id number]  Date of admission: 12/25/2019 Delivery date:12/28/2019  Delivering provider: Arrie Senate  Date of discharge: 12/30/2019  Admitting diagnosis: Chronic hypertension with superimposed preeclampsia [O11.9] Intrauterine pregnancy: [redacted]w[redacted]d    Secondary diagnosis:  Active Problems:   Morbid obesity (HCC)   Moderate persistent asthma, uncomplicated   High-risk pregnancy   AMA (advanced maternal age) multigravida 35+   History of postpartum hemorrhage   Request for sterilization   Gestational diabetes mellitus, class A1   Chronic hypertension with superimposed preeclampsia   [place]) [place]   Severe pre-eclampsia, with delivery   Vaginal delivery  Additional problems:     Discharge diagnosis: Term Pregnancy Delivered, Preeclampsia (severe) and GDM A1                                              Post partum procedures:magnesium sulfate recovery, renal ultrasound, lasix Augmentation: AROM, Pitocin, Cytotec and IP Foley Complications: None  Hospital course: Induction of Labor With Vaginal Delivery   39y.o. yo G2P1001 at 32w3das admitted to the hospital 12/25/2019 for induction of labor.  Indication for induction: Preeclampsia and A1 DM.  Patient had an uncomplicated labor course as follows: Membrane Rupture Time/Date: 3:48 AM ,12/27/2019   Delivery Method:Vaginal, Spontaneous  Episiotomy: None  Lacerations:  None  Details of delivery can be found in separate delivery note.  During the postpartum course the patient was on magnesium sulfate therapy.  Magnesium levels were found to be elevated as was the creatinine.  Pt was given lasix to aid output. Renal ultrasound showed bilateral hydronephrosis. Nephrology was consulted for consideration of acute kidney injury.  The magnesium sulfate was stopped early and pt received multiple  doses of lasix with good response.  Pt was restarted on her prepregnancy norvasc.  The remainder of her postpartum course was normal. Patient is discharged home 12/30/2019.  Newborn Data: Birth date:12/28/2019  Birth time:12:01 AM  Gender:Female  Living status:Living  Apgars:9 ,9  [phone]   Magnesium Sulfate received: Yes: Seizure prophylaxis BMZ received: No Rhophylac:N/A MMR:N/A T-DaP:Given prenatally Flu: N/A Transfusion:No  Physical exam  Vitals:   12/29/19 1951 12/29/19 2319 12/30/19 0545 12/30/19 0804  BP: (!) 154/84 132/67 140/82 130/80  Pulse: 85 88 73 76  Resp: 20 18 18 18   Temp: 98.5 F (36.9 C) 98.1 F (36.7 C) 97.9 F (36.6 C) 98.7 F (37.1 C)  TempSrc: Oral Oral Oral Oral  SpO2: 100% 99% 98% 100%  Weight:      Height:       General: alert, cooperative and no distress Lochia: appropriate Uterine Fundus: firm Incision: N/A DVT Evaluation: No evidence of DVT seen on physical exam. Negative Homan's sign. Labs: Lab Results  Component Value Date   WBC 23.5 (H) 12/28/2019   HGB 10.8 (L) 12/28/2019   HCT 32.6 (L) 12/28/2019   MCV 90.1 12/28/2019   PLT 265 12/28/2019   CMP Latest Ref Rng & Units 12/29/2019  Glucose 70 - 99 mg/dL 82  BUN 6 - 20 mg/dL 12  Creatinine 0.44 - 1.00 mg/dL 1.04(H)  Sodium 135 - 145 mmol/L 138  Potassium 3.5 - 5.1 mmol/L 4.1  Chloride 98 - 111 mmol/L 105  CO2 22 - 32  mmol/L 24  Calcium 8.9 - 10.3 mg/dL 8.4(L)  Total Protein 6.5 - 8.1 g/dL 5.1(L)  Total Bilirubin 0.3 - 1.2 mg/dL 0.3  Alkaline Phos 38 - 126 U/L 80  AST 15 - 41 U/L 19  ALT 0 - 44 U/L 12   Edinburgh Score: Edinburgh Postnatal Depression Scale Screening Tool 12/29/2019  I have been able to laugh and see the funny side of things. 0  I have looked forward with enjoyment to things. 0  I have blamed myself unnecessarily when things went wrong. 0  I have been anxious or worried for no good reason. 0  I have felt scared or panicky for no good reason. 1  Things  have been getting on top of me. 0  I have been so unhappy that I have had difficulty sleeping. 0  I have felt sad or miserable. 0  I have been so unhappy that I have been crying. 0  The thought of harming myself has occurred to me. 0  Edinburgh Postnatal Depression Scale Total 1     After visit meds:  Allergies as of 12/30/2019      Reactions   Banana Rash   Pineapple Rash      Medication List    STOP taking these medications   Accu-Chek Guide Me w/Device Kit   Accu-Chek Guide test strip Generic drug: glucose blood   Accu-Chek Softclix Lancets lancets   aspirin 81 MG chewable tablet Commonly known as: Aspirin Childrens   Epinastine HCl 0.05 % ophthalmic solution   famotidine 20 MG tablet Commonly known as: Pepcid   labetalol 200 MG tablet Commonly known as: NORMODYNE     TAKE these medications   albuterol 108 (90 Base) MCG/ACT inhaler Commonly known as: Proventil HFA Use 4 puffs every 4-6 hours as needed for cough or wheeze.   amLODipine 10 MG tablet Commonly known as: NORVASC Take 1 tablet (10 mg total) by mouth daily.   Blood Pressure Cuff Misc 1 Device by Does not apply route once a week. To check pressure 3-5 times weekly What changed: when to take this   flunisolide 25 MCG/ACT (0.025%) Soln Commonly known as: NASALIDE Place 2 sprays into the nose 2 (two) times daily.   PRENATAL AD PO Take 1 tablet by mouth daily.   senna-docusate 8.6-50 MG tablet Commonly known as: Senokot-S Take 2 tablets by mouth at bedtime as needed for mild constipation.        Discharge home in stable condition Infant Feeding: Bottle and Breast Infant Disposition:home with mother Discharge instruction: per After Visit Summary and Postpartum booklet. Activity: Advance as tolerated. Pelvic rest for 6 weeks.  Diet: routine diet Future Appointments: Future Appointments  Date Time Provider [place]  01/01/2020  9:30 AM [facility] NURSE [facility]  02/01/2020  11:50 AM Cresenzo-Dishmon, Joaquim Lai, CNM [facility]   Follow up Visit:  Follow-up Information    [facility]. Schedule an appointment as soon as possible for a visit in 1 week(s).   Specialty: Obstetrics and Gynecology Why: BP check in 1 week, postpartum exam in 4 weeks Contact information: [place], [facility]. Schedule an appointment as soon as possible for a visit in 1 month(s).   Specialty: Nephrology Why: 1 month f/u for acute kidney injury Contact information: [address] [place] [address] [phone]  Please schedule this patient for a In person postpartum visit in 4 weeks with the following provider: Any provider. Additional Postpartum F/U:2 hour GTT and BP check 2-3 days  High risk pregnancy complicated by: GDM and HTN Delivery mode:  Vaginal, Spontaneous  Anticipated Birth Control:  Plans Interval BTL   12/30/2019 Griffin Basil, MD

## 2019-12-28 NOTE — Progress Notes (Addendum)
[facility] ROUNDING NOTE   Subjective:   Brief history this is a 39 year old lady hypertension admitted with IUP at 38 weeks on 12/25/2019 superimposed preeclampsia with severe features.  Estimated delivery date 01/08/2020.  Ultrasound with normal fetal anatomy.  Delivered normal spontaneous vaginal delivery 12/28/2019.  Blood pressure 131/77 pulse 90 temperature 98.4 O2 sats 100% room air.  Urine output 1.8 L 12/28/2019  Sodium 134 potassium 3.6 chloride 102 CO2 19 BUN 13 creatinine 1.72 glucose 156 calcium 7.6 magnesium 4 albumin 2 hemoglobin 10.8 WBC 23.5  Objective:  Vital signs in last 24 hours:  Temp:  [97 F (36.1 C)-99 F (37.2 C)] 98.4 F (36.9 C) (08/30 0754) Pulse Rate:  [76-211] 90 (08/30 0754) Resp:  [14-20] 18 (08/30 0754) BP: (67-171)/(38-98) 131/77 (08/30 0754) SpO2:  [95 %-100 %] 100 % (08/30 0725)  Weight change:  Filed Weights   12/26/19 0258  Weight: 135.6 kg    Intake/Output: I/O last 3 completed shifts: In: 5684.7 [P.O.:750; I.V.:4229.7; IV Piggyback:705] Out: 3822 [Urine:3422; Blood:400]   Intake/Output this shift:  Total I/O In: 60 [P.O.:60] Out: 375 [Urine:375]  Gen alert nondistressed Sclera anicteric, throat clear  No jvd or bruits Chest clear to auscultation RRR no MRG MS no joint effusions or deformity Ext  1+ lower extremity edema Neuro intact   Basic Metabolic Panel: Recent Labs  Lab 12/25/19 2256 12/25/19 2256 12/27/19 0700 12/27/19 0700 12/27/19 1350 12/27/19 2044 12/28/19 0546  NA 136  --  131*  --  131* 134* 134*  K 3.8  --  4.7  --  4.7 4.3 3.6  CL 108  --  98  --  99 101 102  CO2 18*  --  19*  --  18* 19* 19*  GLUCOSE 101*  --  86  --  87 107* 156*  BUN 8  --  7  --  10 11 13   CREATININE 0.77  --  2.08*  --  2.46* 2.24* 1.72*  CALCIUM 8.9   < > 7.9*   < > 7.7* 7.5* 7.6*  MG  --   --  11.4*  --  10.6* 7.8* 4.0*   < > = values in this interval not displayed.    Liver Function Tests: Recent Labs  Lab  12/25/19 2256 12/27/19 0700 12/27/19 1350 12/27/19 2044 12/28/19 0546  AST 17 19 18 19 20   ALT 10 12 11 12 10   ALKPHOS 99 107 123 108 95  BILITOT 0.6 0.7 0.6 0.9 0.4  PROT 5.8* 5.8* 5.8* 5.7* 5.3*  ALBUMIN 2.4* 2.4* 2.2* 2.1* 2.0*   No results for input(s): LIPASE, AMYLASE in the last 168 hours. No results for input(s): AMMONIA in the last 168 hours.  CBC: Recent Labs  Lab 12/25/19 1939 12/26/19 1647 12/27/19 1350 12/27/19 2044 12/28/19 0546  WBC 13.4* 12.6* 21.4* 22.5* 23.5*  HGB 10.9* 12.1 12.3 11.3* 10.8*  HCT 32.9* 36.6 38.5 34.7* 32.6*  MCV 89.2 89.3 91.7 90.6 90.1  PLT 266 300 297 267 265    Cardiac Enzymes: No results for input(s): CKTOTAL, CKMB, CKMBINDEX, TROPONINI in the last 168 hours.  BNP: Invalid input(s): POCBNP  CBG: Recent Labs  Lab 12/27/19 0823 12/27/19 1115 12/27/19 1606 12/27/19 2011 12/27/19 2235  GLUCAP 81 78 86 106* 4    Microbiology: Results for orders placed or performed in visit on 12/15/19  Strep Gp B NAA     Status: Abnormal   Collection Time: 12/15/19  2:05 PM   [facility]  Result Value Ref Range Status   Strep Gp B NAA Positive (A) Negative Final    Comment: Centers for Disease Control and Prevention (CDC) and American Congress of Obstetricians and Gynecologists (ACOG) guidelines for prevention of perinatal group B streptococcal (GBS) disease specify co-collection of a vaginal and rectal swab specimen to maximize sensitivity of GBS detection. Per the CDC and ACOG, swabbing both the lower vagina and rectum substantially increases the yield of detection compared with sampling the vagina alone. Penicillin G, ampicillin, or cefazolin are indicated for intrapartum prophylaxis of perinatal GBS colonization. Reflex susceptibility testing should be performed prior to use of clindamycin only on GBS isolates from penicillin-allergic women who are considered a high risk for anaphylaxis. Treatment with vancomycin without additional  testing is warranted if resistance to clindamycin is noted.     Coagulation Studies: No results for input(s): LABPROT, INR in the last 72 hours.  Urinalysis: Recent Labs    12/25/19 0941 12/25/19 1802  COLORURINE  --  AMBER*  LABSPEC  --  1.026  PHURINE  --  6.0  GLUCOSEU Negative NEGATIVE  HGBUR  --  NEGATIVE  BILIRUBINUR  --  NEGATIVE  KETONESUR  --  5*  PROTEINUR  --  30*  NITRITE neg NEGATIVE  LEUKOCYTESUR Moderate (2+)* LARGE*      Imaging: US RENAL  Result Date: 12/27/2019 CLINICAL DATA:  Acute kidney injury, [redacted] weeks pregnant. EXAM: RENAL / URINARY TRACT ULTRASOUND COMPLETE COMPARISON:  None. FINDINGS: Right Kidney: Renal measurements: 12.5 cm x 6.1 cm x 5.2 cm = volume: 208 mL. Echogenicity within normal limits. No mass visualized. There is moderate severity right-sided hydronephrosis. Left Kidney: Renal measurements: 12.0 cm x 6.1 cm x 5.3 cm = volume: 205 mL. Echogenicity within normal limits. No mass visualized. There is moderate severity left-sided hydronephrosis. A 7 mm nonshadowing echogenic focus is seen within the lower pole of the left kidney. Bladder: An indwelling catheter is present within the urinary bladder. Other: None. IMPRESSION: 1. Moderate severity bilateral hydronephrosis. 2. 7 mm echogenic focus within the lower pole of the left kidney which may represent a nonobstructing renal stone. Electronically Signed   By: Virgina Norfolk M.D.   On: 12/27/2019 20:32   DG CHEST PORT 1 VIEW  Result Date: 12/27/2019 CLINICAL DATA:  Shortness of breath. EXAM: PORTABLE CHEST 1 VIEW COMPARISON:  04/10/2011. FINDINGS: Unremarkable cardiomediastinal contours. The heart size appears enlarged which may in part reflect portable technique and physiologic changes due to pregnancy. No pleural effusion identified. Diffuse pulmonary vascular congestion. No airspace consolidation. IMPRESSION: Diffuse pulmonary vascular congestion. Electronically Signed   By: Kerby Moors M.D.    On: 12/27/2019 10:51     Medications:   . oxytocin 2.5 Units/hr (12/28/19 0034)  . tranexamic acid     . amLODipine  10 mg Oral Daily  . ibuprofen  600 mg Oral Q6H  . prenatal multivitamin  1 tablet Oral Q1200  . [START ON 12/29/2019] senna-docusate  2 tablet Oral Q24H  . [START ON 12/29/2019] Tdap  0.5 mL Intramuscular Once   acetaminophen, acetaminophen, benzocaine-Menthol, coconut oil, witch hazel-glycerin **AND** dibucaine, diphenhydrAMINE, magnesium hydroxide, ondansetron, ondansetron **OR** ondansetron (ZOFRAN) IV, oxyCODONE-acetaminophen, oxyCODONE-acetaminophen, simethicone, sodium citrate-citric acid  Assessment/ Plan:  1. Acute renal failure - AKI in setting of vol overload, pulm edema responding well to IV lasix now, underlying hx of chronic HTN and superimposed preeclampsia.  Good urine output Lasix has been discontinued. Lacking some typical signs of preeclampsia (not proteinuric, normal LFT's) but  has had HTN problems here.  Renal ultrasound positive hydronephrosis.  Renal function appears to be improving.   Good urine output.  Baseline creatinine 0.77 mg/dL.  I would avoid the use of nonsteroidal anti-inflammatories at this point 2. Hypertension appears to be improved we will continue to follow.  Was on labetalol 200 mg twice daily as an outpatient. 3. Volume -improving with good urine output 4. IUP - per OB 5. Hypermagnesemia - due to Mg loading and acute renal insufficiency   LOS: 3 Sherril Croon @TODAY @9 :03 AM

## 2019-12-28 NOTE — Plan of Care (Signed)
Plan of care discussed with patient and newborn care. Vital signs stable at this time and urine output has improved.DTR's WNL.

## 2019-12-28 NOTE — Plan of Care (Signed)
  Problem: Education: Goal: Knowledge of General Education information will improve Description: Including pain rating scale, medication(s)/side effects and non-pharmacologic comfort measures Outcome: Completed/Met   Problem: Health Behavior/Discharge Planning: Goal: Ability to manage health-related needs will improve Outcome: Completed/Met   Problem: Clinical Measurements: Goal: Ability to maintain clinical measurements within normal limits will improve Outcome: Completed/Met Goal: Will remain free from infection Outcome: Completed/Met Goal: Diagnostic test results will improve Outcome: Completed/Met Goal: Respiratory complications will improve Outcome: Completed/Met Goal: Cardiovascular complication will be avoided Outcome: Completed/Met   Problem: Activity: Goal: Risk for activity intolerance will decrease Outcome: Completed/Met   Problem: Nutrition: Goal: Adequate nutrition will be maintained Outcome: Completed/Met   Problem: Coping: Goal: Level of anxiety will decrease Outcome: Completed/Met   Problem: Elimination: Goal: Will not experience complications related to bowel motility Outcome: Completed/Met Goal: Will not experience complications related to urinary retention Outcome: Completed/Met   Problem: Pain Managment: Goal: General experience of comfort will improve Outcome: Completed/Met   Problem: Safety: Goal: Ability to remain free from injury will improve Outcome: Completed/Met   Problem: Skin Integrity: Goal: Risk for impaired skin integrity will decrease Outcome: Completed/Met   Problem: Education: Goal: Knowledge of Childbirth will improve Outcome: Completed/Met Goal: Ability to make informed decisions regarding treatment and plan of care will improve Outcome: Completed/Met Goal: Ability to state and carry out methods to decrease the pain will improve Outcome: Completed/Met Goal: Individualized Educational Video(s) Outcome: Completed/Met    Problem: Coping: Goal: Ability to verbalize concerns and feelings about labor and delivery will improve Outcome: Completed/Met   Problem: Life Cycle: Goal: Ability to make normal progression through stages of labor will improve Outcome: Completed/Met Goal: Ability to effectively push during vaginal delivery will improve Outcome: Completed/Met   Problem: Role Relationship: Goal: Will demonstrate positive interactions with the child Outcome: Completed/Met   Problem: Safety: Goal: Risk of complications during labor and delivery will decrease Outcome: Completed/Met   Problem: Pain Management: Goal: Relief or control of pain from uterine contractions will improve Outcome: Completed/Met   Problem: Education: Goal: Knowledge of disease or condition will improve Outcome: Completed/Met Goal: Knowledge of the prescribed therapeutic regimen will improve Outcome: Completed/Met   Problem: Fluid Volume: Goal: Peripheral tissue perfusion will improve Outcome: Completed/Met   Problem: Clinical Measurements: Goal: Complications related to disease process, condition or treatment will be avoided or minimized Outcome: Completed/Met

## 2019-12-28 NOTE — Anesthesia Postprocedure Evaluation (Signed)
Anesthesia Post Note  Patient: [name]  Procedure(s) Performed: AN AD HOC LABOR EPIDURAL     Patient location during evaluation: Mother Baby Anesthesia Type: Epidural Level of consciousness: awake and alert Pain management: pain level controlled Vital Signs Assessment: post-procedure vital signs reviewed and stable Respiratory status: spontaneous breathing, nonlabored ventilation and respiratory function stable Cardiovascular status: stable Postop Assessment: no headache, no backache and epidural receding Anesthetic complications: no   No complications documented.  Last Vitals:  Vitals:   12/28/19 0725 12/28/19 0754  BP:  131/77  Pulse:  90  Resp:  18  Temp:  36.9 C  SpO2: 100%     Last Pain:  Vitals:   12/28/19 0754  TempSrc: Oral  PainSc:    Pain Goal: Patients Stated Pain Goal: 3 (12/28/19 0715)                 Barkley Boards

## 2019-12-28 NOTE — Progress Notes (Signed)
Patient stated she "felt like she really needed to pee" despite foley catheter in place. Urine output for the past hour was 5 ml. RN bladder scanned pt to make sure and it revealed 837 ml in bladder. Foley catheter flushed with no effect. Catheter was removed and pt was able to void 275 ml on bedpan. New foley catheter was placed at 0316 and 700 ml was noted. Provider notified of incident.   Consuela Mimes, RN

## 2019-12-29 ENCOUNTER — Other Ambulatory Visit: Payer: Medicaid Other

## 2019-12-29 ENCOUNTER — Encounter: Payer: Medicaid Other | Admitting: Women's Health

## 2019-12-29 DIAGNOSIS — E8770 Fluid overload, unspecified: Secondary | ICD-10-CM | POA: Diagnosis not present

## 2019-12-29 DIAGNOSIS — N179 Acute kidney failure, unspecified: Secondary | ICD-10-CM | POA: Diagnosis not present

## 2019-12-29 DIAGNOSIS — I10 Essential (primary) hypertension: Secondary | ICD-10-CM | POA: Diagnosis not present

## 2019-12-29 DIAGNOSIS — O1414 Severe pre-eclampsia complicating childbirth: Secondary | ICD-10-CM | POA: Diagnosis not present

## 2019-12-29 LAB — COMPREHENSIVE METABOLIC PANEL
ALT: 12 U/L (ref 0–44)
AST: 19 U/L (ref 15–41)
Albumin: 1.9 g/dL — ABNORMAL LOW (ref 3.5–5.0)
Alkaline Phosphatase: 80 U/L (ref 38–126)
Anion gap: 9 (ref 5–15)
BUN: 12 mg/dL (ref 6–20)
CO2: 24 mmol/L (ref 22–32)
Calcium: 8.4 mg/dL — ABNORMAL LOW (ref 8.9–10.3)
Chloride: 105 mmol/L (ref 98–111)
Creatinine, Ser: 1.04 mg/dL — ABNORMAL HIGH (ref 0.44–1.00)
GFR calc Af Amer: >60 mL/min (ref >60)
GFR calc non Af Amer: >60 mL/min (ref >60)
Glucose, Bld: 82 mg/dL (ref 70–99)
Potassium: 4.1 mmol/L (ref 3.5–5.1)
Sodium: 138 mmol/L (ref 135–145)
Total Bilirubin: 0.3 mg/dL (ref 0.3–1.2)
Total Protein: 5.1 g/dL — ABNORMAL LOW (ref 6.5–8.1)

## 2019-12-29 LAB — SURGICAL PATHOLOGY

## 2019-12-29 NOTE — Progress Notes (Signed)
Pt had covid test at [facility] prior to admission and tested negative copy of results in chart

## 2019-12-29 NOTE — Progress Notes (Signed)
[facility] ROUNDING NOTE   Subjective:   Brief history this is a 39 year old lady hypertension admitted with IUP at 38 weeks on 12/25/2019 superimposed preeclampsia with severe features.  Estimated delivery date 01/08/2020.  Ultrasound with normal fetal anatomy.  Delivered normal spontaneous vaginal delivery 12/28/2019.  Blood pressure 140/89 pulse 81 temperature 98.4 O2 sats 99%.  Urine output 3.6 L 12/28/2019  Labs pending at this time.  Objective:  Vital signs in last 24 hours:  Temp:  [98.4 F (36.9 C)-99 F (37.2 C)] 98.4 F (36.9 C) (08/31 0359) Pulse Rate:  [81-100] 81 (08/31 0359) Resp:  [16-18] 17 (08/31 0359) BP: (125-141)/(71-89) 140/89 (08/31 0359) SpO2:  [98 %-100 %] 99 % (08/31 0359)  Weight change:  Filed Weights   12/26/19 0258  Weight: 135.6 kg    Intake/Output: I/O last 3 completed shifts: In: 6856.8 [P.O.:810; I.V.:5341.7; IV Piggyback:705] Out: 3734 [Urine:5027; Blood:400]   Intake/Output this shift:  Total I/O In: 240 [P.O.:240] Out: 400 [Urine:400]  Gen alert nondistressed Sclera anicteric, throat clear  No jvd or bruits Chest clear to auscultation RRR no MRG MS no joint effusions or deformity Ext  1+ lower extremity edema Neuro intact   Basic Metabolic Panel: Recent Labs  Lab 12/25/19 2256 12/25/19 2256 12/27/19 0700 12/27/19 0700 12/27/19 1350 12/27/19 2044 12/28/19 0546 12/28/19 1258  NA 136  --  131*  --  131* 134* 134*  --   K 3.8  --  4.7  --  4.7 4.3 3.6  --   CL 108  --  98  --  99 101 102  --   CO2 18*  --  19*  --  18* 19* 19*  --   GLUCOSE 101*  --  86  --  87 107* 156*  --   BUN 8  --  7  --  10 11 13   --   CREATININE 0.77  --  2.08*  --  2.46* 2.24* 1.72*  --   CALCIUM 8.9   < > 7.9*   < > 7.7* 7.5* 7.6*  --   MG  --   --  11.4*  --  10.6* 7.8* 4.0* 3.1*   < > = values in this interval not displayed.    Liver Function Tests: Recent Labs  Lab 12/25/19 2256 12/27/19 0700 12/27/19 1350  12/27/19 2044 12/28/19 0546  AST 17 19 18 19 20   ALT 10 12 11 12 10   ALKPHOS 99 107 123 108 95  BILITOT 0.6 0.7 0.6 0.9 0.4  PROT 5.8* 5.8* 5.8* 5.7* 5.3*  ALBUMIN 2.4* 2.4* 2.2* 2.1* 2.0*   No results for input(s): LIPASE, AMYLASE in the last 168 hours. No results for input(s): AMMONIA in the last 168 hours.  CBC: Recent Labs  Lab 12/25/19 1939 12/26/19 1647 12/27/19 1350 12/27/19 2044 12/28/19 0546  WBC 13.4* 12.6* 21.4* 22.5* 23.5*  HGB 10.9* 12.1 12.3 11.3* 10.8*  HCT 32.9* 36.6 38.5 34.7* 32.6*  MCV 89.2 89.3 91.7 90.6 90.1  PLT 266 300 297 267 265    Cardiac Enzymes: No results for input(s): CKTOTAL, CKMB, CKMBINDEX, TROPONINI in the last 168 hours.  BNP: Invalid input(s): POCBNP  CBG: Recent Labs  Lab 12/27/19 0823 12/27/19 1115 12/27/19 1606 12/27/19 2011 12/27/19 2235  GLUCAP 81 78 86 106* 25    Microbiology: Results for orders placed or performed in visit on 12/15/19  Strep Gp B NAA     Status: Abnormal   Collection Time: 12/15/19  2:05 PM   [facility]  Result Value Ref Range Status   Strep Gp B NAA Positive (A) Negative Final    Comment: Centers for Disease Control and Prevention (CDC) and American Congress of Obstetricians and Gynecologists (ACOG) guidelines for prevention of perinatal group B streptococcal (GBS) disease specify co-collection of a vaginal and rectal swab specimen to maximize sensitivity of GBS detection. Per the CDC and ACOG, swabbing both the lower vagina and rectum substantially increases the yield of detection compared with sampling the vagina alone. Penicillin G, ampicillin, or cefazolin are indicated for intrapartum prophylaxis of perinatal GBS colonization. Reflex susceptibility testing should be performed prior to use of clindamycin only on GBS isolates from penicillin-allergic women who are considered a high risk for anaphylaxis. Treatment with vancomycin without additional testing is warranted if resistance to clindamycin  is noted.     Coagulation Studies: No results for input(s): LABPROT, INR in the last 72 hours.  Urinalysis: No results for input(s): COLORURINE, LABSPEC, PHURINE, GLUCOSEU, HGBUR, BILIRUBINUR, KETONESUR, PROTEINUR, UROBILINOGEN, NITRITE, LEUKOCYTESUR in the last 72 hours.  Invalid input(s): APPERANCEUR    Imaging: US RENAL  Result Date: 12/27/2019 CLINICAL DATA:  Acute kidney injury, [redacted] weeks pregnant. EXAM: RENAL / URINARY TRACT ULTRASOUND COMPLETE COMPARISON:  None. FINDINGS: Right Kidney: Renal measurements: 12.5 cm x 6.1 cm x 5.2 cm = volume: 208 mL. Echogenicity within normal limits. No mass visualized. There is moderate severity right-sided hydronephrosis. Left Kidney: Renal measurements: 12.0 cm x 6.1 cm x 5.3 cm = volume: 205 mL. Echogenicity within normal limits. No mass visualized. There is moderate severity left-sided hydronephrosis. A 7 mm nonshadowing echogenic focus is seen within the lower pole of the left kidney. Bladder: An indwelling catheter is present within the urinary bladder. Other: None. IMPRESSION: 1. Moderate severity bilateral hydronephrosis. 2. 7 mm echogenic focus within the lower pole of the left kidney which may represent a nonobstructing renal stone. Electronically Signed   By: Virgina Norfolk M.D.   On: 12/27/2019 20:32   DG CHEST PORT 1 VIEW  Result Date: 12/27/2019 CLINICAL DATA:  Shortness of breath. EXAM: PORTABLE CHEST 1 VIEW COMPARISON:  04/10/2011. FINDINGS: Unremarkable cardiomediastinal contours. The heart size appears enlarged which may in part reflect portable technique and physiologic changes due to pregnancy. No pleural effusion identified. Diffuse pulmonary vascular congestion. No airspace consolidation. IMPRESSION: Diffuse pulmonary vascular congestion. Electronically Signed   By: Kerby Moors M.D.   On: 12/27/2019 10:51     Medications:    . amLODipine  10 mg Oral Daily  . prenatal multivitamin  1 tablet Oral Q1200  . senna-docusate  2  tablet Oral Q24H  . Tdap  0.5 mL Intramuscular Once   acetaminophen, benzocaine-Menthol, coconut oil, witch hazel-glycerin **AND** dibucaine, diphenhydrAMINE, magnesium hydroxide, ondansetron, ondansetron **OR** ondansetron (ZOFRAN) IV, oxyCODONE, simethicone, sodium citrate-citric acid  Assessment/ Plan:  1. Acute renal failure - AKI in setting of vol overload, pulm edema responding well to IV lasix now, underlying hx of chronic HTN and superimposed preeclampsia.  Good urine output Lasix has been discontinued. Lacking some typical signs of preeclampsia (not proteinuric, normal LFT's) but has had HTN problems here.  Renal ultrasound positive hydronephrosis (patient with gravid uterus).  Renal function appears to be improving.   Good urine output.  Baseline creatinine 0.77 mg/dL.  I would avoid the use of nonsteroidal anti-inflammatories at this point 2. Hypertension: Blood pressure appears to be okay at this point with 10 mg of amlodipine daily 3. Volume -improving with good  urine output 4. IUP - per OB 5. Hypermagnesemia - due to Mg loading and acute renal insufficiency  Will sign off today.  Follow-up at [facility] 1 month.  Office #[phone] [place]    LOS: [place] @TODAY @6 :20 AM

## 2019-12-29 NOTE — Progress Notes (Signed)
MOB was referred for history of depression/anxiety. * Referral screened out by Clinical Social Worker because none of the following criteria appear to apply: ~ History of anxiety/depression during this pregnancy, or of post-partum depression following prior delivery. ~ Diagnosis of anxiety and/or depression within last 3 years OR * MOB's symptoms currently being treated with medication and/or therapy. Please contact the Clinical Social Worker if needs arise, by MOB request, or if MOB scores greater than 9/yes to question 10 on Edinburgh Postpartum Depression Screen.  [name], MSW, LCSW Clinical Social Work [phone]  

## 2019-12-29 NOTE — Progress Notes (Signed)
POSTPARTUM PROGRESS NOTE  Post Partum Day 1  Subjective:  [name] is a 39 y.o. [id number] s/p SVD at [redacted]w[redacted]d with severe preeclampsia and postpartum acute kidney failurte .  She reports she is doing well. No acute events overnight. She denies any problems with ambulating, voiding or po intake. Denies nausea or vomiting.  Pain is well controlled.  Lochia is normal.  Pt diuresed well with lasix.  Objective: Blood pressure (!) 133/92, pulse 77, temperature 98.3 F (36.8 C), temperature source Oral, resp. rate 18, height 5\' 5"  (1.651 m), weight 135.6 kg, last menstrual period 04/03/2019, SpO2 97 %, unknown if currently breastfeeding.  Physical Exam:  General: alert, cooperative and no distress Chest: no respiratory distress Heart:regular rate, distal pulses intact Abdomen: soft, nontender,  Uterine Fundus: firm, appropriately tender DVT Evaluation: No calf swelling or tenderness Extremities: 1+ bilateral LE edema Skin: warm, dry  Recent Labs    12/27/19 2044 12/28/19 0546  HGB 11.3* 10.8*  HCT 34.7* 32.6*    Assessment/Plan: [name] is a 39 y.o. [id number] s/p SVD at [redacted]w[redacted]d   PPD#1 - Doing well  Routine postpartum care             Continue Norvasc, discussed additional antihypertensives with Nephrology.             Labetalol would be their choice if additional meds needed  Contraception: interval BTL Feeding: breast/bottle Dispo: Anticipate d/c home on 9/1.   LOS: 4 days   Lynnda Shields, MD Faculty attending 12/29/2019, 9:49 AM

## 2019-12-29 NOTE — Lactation Note (Signed)
This note was copied from a baby's chart. Lactation Consultation Note  Patient Name: [name] [id number] Date: 12/29/2019 Reason for consult: Follow-up assessment  Follow up visit to 43 hours old infant with 4.33% weight loss. Mother reports breastfeeding is "so-so" Mother states she has not pumped frequently. Encouraged mother to pump as often as she bottle-feeds formula. Mother shares she is not getting much and she will continue formula feeding until she has enough. Discussed  paced bottle feeding, frequent burping and sit up position. Encouraged to contact lactation services as needed for any questions or concerns. Name written on board.   Feeding Feeding Type: Bottle Fed - Formula Nipple Type: Nfant Slow Flow (purple)   Interventions Interventions: DEBP;Breast feeding basics reviewed  Consult Status Consult Status: Follow-up Date: 12/30/19 Follow-up type: In-patient    Beacher Every A Higuera Ancidey 12/29/2019, 12:48 PM

## 2019-12-30 MED ORDER — AMLODIPINE BESYLATE 10 MG PO TABS
10.0000 mg | ORAL_TABLET | Freq: Every day | ORAL | 3 refills | Status: DC
Start: 2019-12-30 — End: 2020-01-28

## 2019-12-30 MED ORDER — SENNOSIDES-DOCUSATE SODIUM 8.6-50 MG PO TABS: 2.0000 | ORAL_TABLET | Freq: Every evening | ORAL | 1 refills | Status: DC | PRN

## 2019-12-30 MED FILL — AMLODIPINE BESYLATE 10 MG T: 10 | 30 days supply | Qty: 30 | Fill #0

## 2019-12-30 MED FILL — SENEXON-S 8.6-50 MG TABS: 8.6-50 | 10 days supply | Qty: 20 | Fill #0

## 2019-12-30 NOTE — Lactation Note (Signed)
This note was copied from a baby's chart. Lactation Consultation Note  Patient Name: Girl Corayma Cashatt [id number] Date: 12/30/2019 Reason for consult: Follow-up assessment;1st time breastfeeding;Early term 37-38.6wks;Infant < 6lbs  Visited with mom of a 45 hours old ETI female, she's a P2 but this is her first time BF. Mom and baby are going home today, baby is at 2% weight loss. Mom was set up with a DEBP but she hasn't been pumping. She told LC that she didn't pump at all yesterday or today, dad feeding baby a bottle when entering the room.  Baby has been mainly on Similac 22 calorie formula and mom told LC she's probably not going to put baby to breast but mainly pumping and bottle/formula feeding. Explained to mom the importance of consistent pumping in order to protect her supply and prevent engorgement.  Reviewed discharge instructions, engorgement prevention/treatment, treatment/prevention of sore nipples and supplementation guidelines. Parents reported all questions and concerns were answered, they're both aware of [facility] and will contact PRN.   Maternal Data    Feeding Feeding Type: Bottle Fed - Formula Nipple Type: Nfant Slow Flow (purple)  LATCH Score                   Interventions Interventions: Breast feeding basics reviewed;DEBP  Lactation Tools Discussed/Used     Consult Status Consult Status: Complete Date: 12/30/19 Follow-up type: Call as needed    Rhoderick Farrel Francene Boyers 12/30/2019, 7:56 AM

## 2019-12-30 NOTE — Progress Notes (Signed)
Teaching complete 

## 2019-12-30 NOTE — Discharge Instructions (Signed)
Acute Kidney Injury, Adult  Acute kidney injury is a sudden worsening of kidney function. The kidneys are organs that have several jobs. They filter the blood to remove waste products and extra fluid. They also maintain a healthy balance of minerals and hormones in the body, which helps control blood pressure and keep bones strong. With this condition, your kidneys do not do their jobs as well as they should. This condition ranges from mild to severe. Over time it may develop into long-lasting (chronic) kidney disease. Early detection and treatment may prevent acute kidney injury from developing into a chronic condition. What are the causes? Common causes of this condition include:  A problem with blood flow to the kidneys. This may be caused by: ? Low blood pressure (hypotension) or shock. ? Blood loss. ? Heart and blood vessel (cardiovascular) disease. ? Severe burns. ? Liver disease.  Direct damage to the kidneys. This may be caused by: ? Certain medicines. ? A kidney infection. ? Poisoning. ? Being around or in contact with toxic substances. ? A surgical wound. ? A hard, direct hit to the kidney area.  A sudden blockage of urine flow. This may be caused by: ? Cancer. ? Kidney stones. ? An enlarged prostate in males. What are the signs or symptoms? Symptoms of this condition may not be obvious until the condition becomes severe. Symptoms of this condition can include:  Tiredness (lethargy), or difficulty staying awake.  Nausea or vomiting.  Swelling (edema) of the face, legs, ankles, or feet.  Problems with urination, such as: ? Abdominal pain, or pain along the side of your stomach (flank). ? Decreased urine production. ? Decrease in the force of urine flow.  Muscle twitches and cramps, especially in the legs.  Confusion or trouble concentrating.  Loss of appetite.  Fever. How is this diagnosed? This condition may be diagnosed with tests, including:  Blood  tests.  Urine tests.  Imaging tests.  A test in which a sample of tissue is removed from the kidneys to be examined under a microscope (kidney biopsy). How is this treated? Treatment for this condition depends on the cause and how severe the condition is. In mild cases, treatment may not be needed. The kidneys may heal on their own. In more severe cases, treatment will involve:  Treating the cause of the kidney injury. This may involve changing any medicines you are taking or adjusting your dosage.  Fluids. You may need specialized IV fluids to balance your body's needs.  Having a catheter placed to drain urine and prevent blockages.  Preventing problems from occurring. This may mean avoiding certain medicines or procedures that can cause further injury to the kidneys. In some cases treatment may also require:  A procedure to remove toxic wastes from the body (dialysis or continuous renal replacement therapy - CRRT).  Surgery. This may be done to repair a torn kidney, or to remove the blockage from the urinary system. Follow these instructions at home: Medicines  Take over-the-counter and prescription medicines only as told by your health care provider.  Do not take any new medicines without your health care provider's approval. Many medicines can worsen your kidney damage.  Do not take any vitamin and mineral supplements without your health care provider's approval. Many nutritional supplements can worsen your kidney damage. Lifestyle  If your health care provider prescribed changes to your diet, follow them. You may need to decrease the amount of protein you eat.  Achieve and maintain a  healthy weight. If you need help with this, ask your health care provider.  Start or continue an exercise plan. Try to exercise at least 30 minutes a day, 5 days a week.  Do not use any tobacco products, such as cigarettes, chewing tobacco, and e-cigarettes. If you need help quitting, ask your  health care provider. General instructions  Keep track of your blood pressure. Report changes in your blood pressure as told by your health care provider.  Stay up to date with immunizations. Ask your health care provider which immunizations you need.  Keep all follow-up visits as told by your health care provider. This is important. Where to find more information  American Association of Kidney Patients: BombTimer.gl  National Kidney Foundation: www.kidney.Justice: https://mathis.com/  Life Options Rehabilitation Program: ? www.lifeoptions.org ? www.kidneyschool.org Contact a health care provider if:  Your symptoms get worse.  You develop new symptoms. Get help right away if:  You develop symptoms of worsening kidney disease, which include: ? Headaches. ? Abnormally dark or light skin. ? Easy bruising. ? Frequent hiccups. ? Chest pain. ? Shortness of breath. ? End of menstruation in women. ? Seizures. ? Confusion or altered mental status. ? Abdominal or back pain. ? Itchiness.  You have a fever.  Your body is producing less urine.  You have pain or bleeding when you urinate. Summary  Acute kidney injury is a sudden worsening of kidney function.  Acute kidney injury can be caused by problems with blood flow to the kidneys, direct damage to the kidneys, and sudden blockage of urine flow.  Symptoms of this condition may not be obvious until it becomes severe. Symptoms may include edema, lethargy, confusion, nausea or vomiting, and problems passing urine.  This condition can usually be diagnosed with blood tests, urine tests, and imaging tests. Sometimes a kidney biopsy is done to diagnose this condition.  Treatment for this condition often involves treating the underlying cause. It is treated with fluids, medicines, dialysis, diet changes, or surgery. This information is not intended to replace advice given to you by your health care provider. Make  sure you discuss any questions you have with your health care provider. Document Revised: 03/29/2017 Document Reviewed: 04/06/2016 Elsevier Patient Education  2020 [place]. Postpartum Care After Vaginal Delivery This sheet gives you information about how to care for yourself from the time you deliver your baby to up to 6-12 weeks after delivery (postpartum period). Your health care provider may also give you more specific instructions. If you have problems or questions, contact your health care provider. Follow these instructions at home: Vaginal bleeding  It is normal to have vaginal bleeding (lochia) after delivery. Wear a sanitary pad for vaginal bleeding and discharge. ? During the first week after delivery, the amount and appearance of lochia is often similar to a menstrual period. ? Over the next few weeks, it will gradually decrease to a dry, yellow-brown discharge. ? For most women, lochia stops completely by 4-6 weeks after delivery. Vaginal bleeding can vary from woman to woman.  Change your sanitary pads frequently. Watch for any changes in your flow, such as: ? A sudden increase in volume. ? A change in color. ? Large blood clots.  If you pass a blood clot from your vagina, save it and call your health care provider to discuss. Do not flush blood clots down the toilet before talking with your health care provider.  Do not use tampons or douches until your  health care provider says this is safe.  If you are not breastfeeding, your period should return 6-8 weeks after delivery. If you are feeding your child breast milk only (exclusive breastfeeding), your period may not return until you stop breastfeeding. Perineal care  Keep the area between the vagina and the anus (perineum) clean and dry as told by your health care provider. Use medicated pads and pain-relieving sprays and creams as directed.  If you had a cut in the perineum (episiotomy) or a tear in the vagina, check  the area for signs of infection until you are healed. Check for: ? More redness, swelling, or pain. ? Fluid or blood coming from the cut or tear. ? Warmth. ? Pus or a bad smell.  You may be given a squirt bottle to use instead of wiping to clean the perineum area after you go to the bathroom. As you start healing, you may use the squirt bottle before wiping yourself. Make sure to wipe gently.  To relieve pain caused by an episiotomy, a tear in the vagina, or swollen veins in the anus (hemorrhoids), try taking a warm sitz bath 2-3 times a day. A sitz bath is a warm water bath that is taken while you are sitting down. The water should only come up to your hips and should cover your buttocks. Breast care  Within the first few days after delivery, your breasts may feel heavy, full, and uncomfortable (breast engorgement). Milk may also leak from your breasts. Your health care provider can suggest ways to help relieve the discomfort. Breast engorgement should go away within a few days.  If you are breastfeeding: ? Wear a bra that supports your breasts and fits you well. ? Keep your nipples clean and dry. Apply creams and ointments as told by your health care provider. ? You may need to use breast pads to absorb milk that leaks from your breasts. ? You may have uterine contractions every time you breastfeed for up to several weeks after delivery. Uterine contractions help your uterus return to its normal size. ? If you have any problems with breastfeeding, work with your health care provider or Science writer.  If you are not breastfeeding: ? Avoid touching your breasts a lot. Doing this can make your breasts produce more milk. ? Wear a good-fitting bra and use cold packs to help with swelling. ? Do not squeeze out (express) milk. This causes you to make more milk. Intimacy and sexuality  Ask your health care provider when you can engage in sexual activity. This may depend on: ? Your risk  of infection. ? How fast you are healing. ? Your comfort and desire to engage in sexual activity.  You are able to get pregnant after delivery, even if you have not had your period. If desired, talk with your health care provider about methods of birth control (contraception). Medicines  Take over-the-counter and prescription medicines only as told by your health care provider.  If you were prescribed an antibiotic medicine, take it as told by your health care provider. Do not stop taking the antibiotic even if you start to feel better. Activity  Gradually return to your normal activities as told by your health care provider. Ask your health care provider what activities are safe for you.  Rest as much as possible. Try to rest or take a nap while your baby is sleeping. Eating and drinking   Drink enough fluid to keep your urine pale yellow.  Eat  high-fiber foods every day. These may help prevent or relieve constipation. High-fiber foods include: ? Whole grain cereals and breads. ? Brown rice. ? Beans. ? Fresh fruits and vegetables.  Do not try to lose weight quickly by cutting back on calories.  Take your prenatal vitamins until your postpartum checkup or until your health care provider tells you it is okay to stop. Lifestyle  Do not use any products that contain nicotine or tobacco, such as cigarettes and e-cigarettes. If you need help quitting, ask your health care provider.  Do not drink alcohol, especially if you are breastfeeding. General instructions  Keep all follow-up visits for you and your baby as told by your health care provider. Most women visit their health care provider for a postpartum checkup within the first 3-6 weeks after delivery. Contact a health care provider if:  You feel unable to cope with the changes that your child brings to your life, and these feelings do not go away.  You feel unusually sad or worried.  Your breasts become red, painful, or  hard.  You have a fever.  You have trouble holding urine or keeping urine from leaking.  You have little or no interest in activities you used to enjoy.  You have not breastfed at all and you have not had a menstrual period for 12 weeks after delivery.  You have stopped breastfeeding and you have not had a menstrual period for 12 weeks after you stopped breastfeeding.  You have questions about caring for yourself or your baby.  You pass a blood clot from your vagina. Get help right away if:  You have chest pain.  You have difficulty breathing.  You have sudden, severe leg pain.  You have severe pain or cramping in your lower abdomen.  You bleed from your vagina so much that you fill more than one sanitary pad in one hour. Bleeding should not be heavier than your heaviest period.  You develop a severe headache.  You faint.  You have blurred vision or spots in your vision.  You have bad-smelling vaginal discharge.  You have thoughts about hurting yourself or your baby. If you ever feel like you may hurt yourself or others, or have thoughts about taking your own life, get help right away. You can go to the nearest emergency department or call:  Your local emergency services (911 in the [place]  A suicide crisis helpline, such as the [facility] at 571-576-9017. This is open 24 hours a day. Summary  The period of time right after you deliver your newborn up to 6-12 weeks after delivery is called the postpartum period.  Gradually return to your normal activities as told by your health care provider.  Keep all follow-up visits for you and your baby as told by your health care provider. This information is not intended to replace advice given to you by your health care provider. Make sure you discuss any questions you have with your health care provider. Document Revised: 04/19/2017 Document Reviewed: 01/28/2017 Elsevier Patient Education  2020  [employer].

## 2019-12-30 NOTE — Progress Notes (Addendum)
POSTPARTUM PROGRESS NOTE  Post Partum Day 2  Subjective:  [name] is a 39 y.o. [id number] s/p induced vaginal delivery at [redacted]w[redacted]d for chronic hypertension with superimposed severe preeclampsia.  She reports she is doing well. No acute events overnight. She denies any problems with ambulating, voiding or po intake. Denies nausea or vomiting.  Pain is well controlled.  Lochia is normal.  Objective: Blood pressure 130/80, pulse 76, temperature 98.7 F (37.1 C), temperature source Oral, resp. rate 18, height 5\' 5"  (1.651 m), weight 135.6 kg, last menstrual period 04/03/2019, SpO2 100 %, unknown if currently breastfeeding.  Physical Exam:  General: alert, cooperative and no distress Chest: no respiratory distress Heart:regular rate, distal pulses intact Abdomen: soft, nontender,  Uterine Fundus: firm, appropriately tender DVT Evaluation: No calf swelling or tenderness Extremities: minimal edema Skin: warm, dry  Recent Labs    12/27/19 2044 12/28/19 0546  HGB 11.3* 10.8*  HCT 34.7* 32.6*    Assessment/Plan: [name] is a 39 y.o. [id number] s/p vaginal deilivery at [redacted]w[redacted]d   PPD#2 - Doing well  Routine postpartum care             Contraception: interval bilateral tubal ligation             Feeding: breast and bottle Dispo: Plan for discharge 12/30/19. If additional antihypertensives are needed, nephrology recommends adding labetalol BID  LOS: 5 days   Lynnda Shields, MD Faculty attending 12/30/2019, 9:04 AM

## 2019-12-31 ENCOUNTER — Telehealth: Payer: Self-pay | Admitting: *Deleted

## 2019-12-31 NOTE — Telephone Encounter (Signed)
Contacted patient to complete TRansition of Care Transition Care Management Follow-up Telephone Call  Date of discharge and from where: 12/30/19, [facility]  How have you been since you were released from the hospital? "ok"  Any questions or concerns? No  Items Reviewed:  Did the pt receive and understand the discharge instructions provided? Yes   Medications obtained and verified? Yes   Any new allergies since your discharge? No   Dietary orders reviewed? YES  Do you have support at home? Yes   Functional Questionnaire: (I = Independent and D = Dependent) ADLs: I  Bathing/Dressing- I  Meal Prep- I  Eating- I  Maintaining continence- I  Transferring/Ambulation- I  Managing Meds- I  Follow up appointments reviewed:   PCP Hospital f/u appt confirmed? No Patient to call office or Dr [name] on 12/31/19 to schedule follow up appt  Specialist Hospital f/u appt confirmed? Yes  Scheduled to see [facility] RN on 01/01/20 @ 0930, and Dr Vevelyn Pat on 02/01/20 @ 1150.  Are transportation arrangements needed? No   If their condition worsens, is the pt aware to call PCP or go to the Emergency Dept.? YES  Was the patient provided with contact information for the PCP's office or ED? YES  Was to pt encouraged to call back with questions or concerns? YES  Lenor Coffin, RN, BSN, [facility] Patient [place] [phone]

## 2019-12-31 NOTE — Telephone Encounter (Signed)
Called patients PCP to schedule follow up appointment . Tameah Mihalko [facility]  [phone]

## 2020-01-01 ENCOUNTER — Other Ambulatory Visit: Payer: Medicaid Other

## 2020-01-01 ENCOUNTER — Telehealth: Payer: Self-pay | Admitting: Obstetrics & Gynecology

## 2020-01-01 ENCOUNTER — Other Ambulatory Visit: Payer: Self-pay

## 2020-01-01 ENCOUNTER — Ambulatory Visit: Payer: Medicaid Other | Admitting: *Deleted

## 2020-01-01 VITALS — BP 151/100

## 2020-01-01 DIAGNOSIS — O10919 Unspecified pre-existing hypertension complicating pregnancy, unspecified trimester: Secondary | ICD-10-CM

## 2020-01-01 MED ORDER — HYDROCHLOROTHIAZIDE 25 MG PO TABS: 25.0000 mg | ORAL_TABLET | Freq: Every day | ORAL | 0 refills | Status: DC

## 2020-01-01 NOTE — Progress Notes (Addendum)
   NURSE VISIT- BLOOD PRESSURE CHECK  SUBJECTIVE:  [name] is a 39 y.o. [id number] female here for BP check. She is postpartum, delivery date 12/28/19    HYPERTENSION ROS:  Pregnant/postpartum:  . Severe headaches that don't go away with tylenol/other medicines: No  . Visual changes (seeing spots/double/blurred vision) No  . Severe pain under right breast breast or in center of upper chest No  . Severe nausea/vomiting No  . Taking medicines as instructed yes   OBJECTIVE:  BP (!) 141/100   Appearance alert, well appearing, and in no distress.  ASSESSMENT: Postpartum  blood pressure check  PLAN: Discussed with Dr. Orlie Pollen of Attending Supervision of Nursing Visit Encounter: Evaluation and management procedures were performed by the nursing staff under my supervision and collaboration.  I have reviewed the nurse's note and chart, and I agree with the management and plan.  Jacelyn Grip MD Attending Physician for the [facility] 01/26/2020 4:43 PM  Recommendations: new prescription will be sent   Follow-up: in 2 weeks for bp check   Janece Canterbury  01/01/2020 10:05 AM

## 2020-01-05 ENCOUNTER — Other Ambulatory Visit: Payer: Medicaid Other

## 2020-01-05 ENCOUNTER — Encounter: Payer: Medicaid Other | Admitting: Obstetrics & Gynecology

## 2020-01-08 ENCOUNTER — Other Ambulatory Visit: Payer: Medicaid Other

## 2020-01-08 ENCOUNTER — Inpatient Hospital Stay (HOSPITAL_COMMUNITY): Admission: AD | Admit: 2020-01-08 | Payer: Medicaid Other | Source: Home / Self Care

## 2020-01-11 ENCOUNTER — Ambulatory Visit (HOSPITAL_COMMUNITY)
Admission: RE | Admit: 2020-01-11 | Discharge: 2020-01-11 | Disposition: A | Discharge status: Home / Self Care | Payer: Medicaid Other | Source: Ambulatory Visit | Attending: Family Medicine | Admitting: Family Medicine

## 2020-01-11 ENCOUNTER — Ambulatory Visit (INDEPENDENT_AMBULATORY_CARE_PROVIDER_SITE_OTHER): Payer: Medicaid Other | Admitting: Family Medicine

## 2020-01-11 ENCOUNTER — Other Ambulatory Visit: Payer: Self-pay

## 2020-01-11 ENCOUNTER — Encounter: Payer: Self-pay | Admitting: Family Medicine

## 2020-01-11 VITALS — BP 132/78 | HR 100 | Temp 98.6°F | Resp 16 | Ht 65.0 in | Wt 280.0 lb

## 2020-01-11 DIAGNOSIS — I10 Essential (primary) hypertension: Secondary | ICD-10-CM | POA: Insufficient documentation

## 2020-01-11 DIAGNOSIS — R0989 Other specified symptoms and signs involving the circulatory and respiratory systems: Secondary | ICD-10-CM | POA: Insufficient documentation

## 2020-01-11 DIAGNOSIS — E876 Hypokalemia: Secondary | ICD-10-CM | POA: Diagnosis not present

## 2020-01-11 DIAGNOSIS — R059 Cough, unspecified: Secondary | ICD-10-CM

## 2020-01-11 DIAGNOSIS — R05 Cough: Secondary | ICD-10-CM | POA: Insufficient documentation

## 2020-01-11 LAB — COMPREHENSIVE METABOLIC PANEL
AG Ratio: 1.2 (calc) (ref 1.0–2.5)
ALT: 9 U/L (ref 6–29)
AST: 12 U/L (ref 10–30)
Albumin: 3.6 g/dL (ref 3.6–5.1)
Alkaline phosphatase (APISO): 93 U/L (ref 31–125)
BUN: 11 mg/dL (ref 7–25)
CO2: 29 mmol/L (ref 20–32)
Calcium: 9.3 mg/dL (ref 8.6–10.2)
Chloride: 102 mmol/L (ref 98–110)
Creat: 0.91 mg/dL (ref 0.50–1.10)
Globulin: 3.1 g/dL (calc) (ref 1.9–3.7)
Glucose, Bld: 133 mg/dL — ABNORMAL HIGH (ref 65–99)
Potassium: 3.2 mmol/L — ABNORMAL LOW (ref 3.5–5.3)
Sodium: 140 mmol/L (ref 135–146)
Total Bilirubin: 0.6 mg/dL (ref 0.2–1.2)
Total Protein: 6.7 g/dL (ref 6.1–8.1)

## 2020-01-11 LAB — CBC WITH DIFFERENTIAL/PLATELET
Absolute Monocytes: 481 cells/uL (ref 200–950)
Basophils Absolute: 116 cells/uL (ref 0–200)
Basophils Relative: 1.3 %
Eosinophils Absolute: 561 cells/uL — ABNORMAL HIGH (ref 15–500)
Eosinophils Relative: 6.3 %
HCT: 38.4 % (ref 35.0–45.0)
Hemoglobin: 12.7 g/dL (ref 11.7–15.5)
Lymphs Abs: 2323 cells/uL (ref 850–3900)
MCH: 30 pg (ref 27.0–33.0)
MCHC: 33.1 g/dL (ref 32.0–36.0)
MCV: 90.8 fL (ref 80.0–100.0)
MPV: 11 fL (ref 7.5–12.5)
Monocytes Relative: 5.4 %
Neutro Abs: 5420 cells/uL (ref 1500–7800)
Neutrophils Relative %: 60.9 %
Platelets: 423 10*3/uL — ABNORMAL HIGH (ref 140–400)
RBC: 4.23 10*6/uL (ref 3.80–5.10)
RDW: 12.8 % (ref 11.0–15.0)
Total Lymphocyte: 26.1 %
WBC: 8.9 10*3/uL (ref 3.8–10.8)

## 2020-01-11 LAB — MAGNESIUM: Magnesium: 1.8 mg/dL (ref 1.5–2.5)

## 2020-01-11 MED ORDER — POTASSIUM CHLORIDE CRYS ER 20 MEQ PO TBCR: EXTENDED_RELEASE_TABLET | ORAL | 0 refills | Status: DC

## 2020-01-11 NOTE — Progress Notes (Signed)
   Subjective:    Patient ID: [name], female    DOB: July 30, 1980, 39 y.o.   MRN: [id number]  Patient presents for Hospital F/U (complications after delivery)   Pt here for hospital follow up, she was admitted for induction for chronic hypertension that turned nto pre-ecclamsia on 8/27, delivered 3 days later on 8/30. She was treated with magnesium. She had vaginal delivery of healthy girl  Her renal function went up along with elevated magnesium levels and nephrology was consulted. Mag sulfate was stopped early and she received lasix with urinary response. She was not urinating very much prior the lasix  She was restarted on norvasc labeatolol was discontinued  She did have vasular congestion on CXR 2 weeks ago She continues to have dry cough, feels like her breathing isnt right since she left the hospital  She was also diagnosed with gestational diabetes, diet controlled   After discharge started on HCTZ by her GYN ON 9/3 She is is breastfeeding    At home BP runs 130-150/80/90's   She has appt nephrology in 1 month   Review Of Systems:  GEN- denies fatigue, fever, weight loss,weakness, recent illness HEENT- denies eye drainage, change in vision, nasal discharge, CVS- denies chest pain, palpitations RESP- denies SOB, cough, wheeze ABD- denies N/V, change in stools, abd pain GU- denies dysuria, hematuria, dribbling, incontinence MSK- denies joint pain, muscle aches, injury Neuro- denies headache, dizziness, syncope, seizure activity       Objective:    BP 132/78   Pulse 100   Temp 98.6 F (37 C) (Temporal)   Resp 16   Ht 5\' 5"  (1.651 m)   Wt 280 lb (127 kg)   SpO2 98%   BMI 46.59 kg/m  GEN- NAD, alert and oriented x3 HEENT- PERRL, EOMI, non injected sclera, pink conjunctiva, MMM, oropharynx clear Neck- Supple, no thyromegaly CVS- RRR, no murmur RESP-CTAB ABD-NABS,soft,NT,ND EXT- No edema Pulses- Radial, DP- 2+        Assessment & Plan:       Problem List Items Addressed This Visit      Unprioritized   Hypertension    Blood pressure look good today Stat labs shows normal renal function But low potassium in setting of HCTZ, so will replete Continue current meds  F/u GYN as scheduled and nephrology CXR shows resolution of vascular congestion, cough may be allergy irritant or cough variant to her asthma, continue asthma meds       Relevant Orders   DG Chest 2 View (Completed)   CBC with Differential/Platelet (Completed)   Comprehensive metabolic panel (Completed)   Magnesium (Completed)    Other Visit Diagnoses    Cough    -  Primary   Relevant Orders   DG Chest 2 View (Completed)   CBC with Differential/Platelet (Completed)   Pulmonary vascular congestion       Relevant Orders   DG Chest 2 View (Completed)   Hypokalemia          Note: This dictation was prepared with Dragon dictation along with smaller phrase technology. Any transcriptional errors that result from this process are unintentional.

## 2020-01-11 NOTE — Assessment & Plan Note (Signed)
Blood pressure look good today Stat labs shows normal renal function But low potassium in setting of HCTZ, so will replete Continue current meds  F/u GYN as scheduled and nephrology CXR shows resolution of vascular congestion, cough may be allergy irritant or cough variant to her asthma, continue asthma meds

## 2020-01-11 NOTE — Patient Instructions (Addendum)
We will call with lab results Keep appointment with nephrology and your OB F/U 3 months

## 2020-01-14 ENCOUNTER — Other Ambulatory Visit: Payer: Self-pay

## 2020-01-14 ENCOUNTER — Ambulatory Visit (INDEPENDENT_AMBULATORY_CARE_PROVIDER_SITE_OTHER): Payer: Medicaid Other | Admitting: *Deleted

## 2020-01-14 ENCOUNTER — Encounter: Payer: Self-pay | Admitting: *Deleted

## 2020-01-14 VITALS — BP 138/92 | HR 102 | Ht 65.0 in | Wt 279.2 lb

## 2020-01-14 DIAGNOSIS — Z0130 Encounter for examination of blood pressure without abnormal findings: Secondary | ICD-10-CM

## 2020-01-14 NOTE — Progress Notes (Addendum)
   NURSE VISIT- BLOOD PRESSURE CHECK  SUBJECTIVE:  [name] is a 39 y.o. [id number] female here for BP check. She is postpartum, delivery date 12/28/2019    HYPERTENSION ROS:  Pregnant/postpartum:  . Severe headaches that don't go away with tylenol/other medicines: No  . Visual changes (seeing spots/double/blurred vision) No  . Severe pain under right breast breast or in center of upper chest No  . Severe nausea/vomiting No  . Taking medicines as instructed yes   OBJECTIVE:  BP (!) 136/96 (BP Location: Right Wrist, Patient Position: Sitting, Cuff Size: Normal)   Pulse (!) 104   Ht 5\' 5"  (1.651 m)   Wt 279 lb 3.2 oz (126.6 kg)   Breastfeeding Yes   BMI 46.46 kg/m   Appearance alert, well appearing, and in no distress.  ASSESSMENT: Postpartum  blood pressure check  PLAN: Discussed with Nigel Berthold, CNM   Recommendations: Stop blood pressure medication on 10/1. Keep postpartum visit as scheduled.    Follow-up: as scheduled   Dwyane Dee  01/14/2020 2:39 PM   Chart reviewed for nurse visit. Agree with plan of care.  Christin Fudge, [place] 01/14/2020 3:13 PM

## 2020-01-28 ENCOUNTER — Encounter: Payer: Self-pay | Admitting: Family Medicine

## 2020-01-28 MED ORDER — AMLODIPINE BESYLATE 10 MG PO TABS: 10.0000 mg | ORAL_TABLET | Freq: Every day | ORAL | 3 refills | Status: DC

## 2020-01-28 MED ORDER — HYDROCHLOROTHIAZIDE 25 MG PO TABS
25.0000 mg | ORAL_TABLET | Freq: Every day | ORAL | 0 refills | Status: DC
Start: 2020-01-28 — End: 2020-03-28

## 2020-02-01 ENCOUNTER — Ambulatory Visit (INDEPENDENT_AMBULATORY_CARE_PROVIDER_SITE_OTHER): Payer: Medicaid Other | Admitting: Women's Health

## 2020-02-01 ENCOUNTER — Encounter: Payer: Self-pay | Admitting: Women's Health

## 2020-02-01 DIAGNOSIS — Z8759 Personal history of other complications of pregnancy, childbirth and the puerperium: Secondary | ICD-10-CM

## 2020-02-01 DIAGNOSIS — O99893 Other specified diseases and conditions complicating puerperium: Secondary | ICD-10-CM

## 2020-02-01 DIAGNOSIS — N939 Abnormal uterine and vaginal bleeding, unspecified: Secondary | ICD-10-CM

## 2020-02-01 DIAGNOSIS — Z8632 Personal history of gestational diabetes: Secondary | ICD-10-CM

## 2020-02-01 LAB — POCT HEMOGLOBIN: Hemoglobin: 12.9 g/dL (ref 11–14.6)

## 2020-02-01 MED ORDER — MISOPROSTOL 200 MCG PO TABS: 200.0000 ug | ORAL_TABLET | Freq: Three times a day (TID) | ORAL | 0 refills | Status: DC

## 2020-02-01 NOTE — Patient Instructions (Addendum)
You will have your sugar test next visit.  Please do not eat or drink anything after midnight the night before you come, not even water.  You will be here for at least two hours.  Please make an appointment online for the bloodwork at [email or web address] for 8:30am (or as close to this as possible). Make sure you select the [facility] service center. The day of the appointment, check in with our office first, then you will go to [facility] to start the sugar test.    Tips To Increase Milk Supply  Lots of water! Enough so that your urine is clear  Plenty of calories, if you're not getting enough calories, your milk supply can decrease  Breastfeed/pump often, every 2-3 hours x 20-25mins  Fenugreek 3 pills 3 times a day, this may make your urine smell like maple syrup  Mother's Milk Tea  Lactation cookies, google for the recipe  Real oatmeal  Body Armor sports drinks  Greater Than hydration drink     Postpartum Tubal Ligation Postpartum tubal ligation (PPTL) is a procedure to close the fallopian tubes. This is done so that you cannot get pregnant. When the fallopian tubes are closed, the eggs that the ovaries release cannot enter the uterus, and sperm cannot reach the eggs. PPTL is done right after childbirth or 1-2 days after childbirth, before the uterus returns to its normal location. If you have a cesarean section, it can be performed at the same time as the procedure. Having this done after childbirth does not make your stay in the hospital longer. PPTL is sometimes called "getting your tubes tied." You should not have this procedure if you want to get pregnant again or if you are unsure about having more children. Tell a health care provider about:  Any allergies you have.  All medicines you are taking, including vitamins, herbs, eye drops, creams, and over-the-counter medicines.  Any problems you or family members have had with anesthetic medicines.  Any blood disorders you have.  Any  surgeries you have had.  Any medical conditions you have or have had.  Any past pregnancies. What are the risks? Generally, this is a safe procedure. However, problems may occur, including:  Infection.  Bleeding.  Injury to other organs in the abdomen.  Side effects from anesthetic medicines.  Failure of the procedure. If this happens, you could get pregnant.  Having a fertilized egg attach outside the uterus (ectopic pregnancy). What happens before the procedure?  Ask your health care provider about: ? How much pain you can expect to have. ? What medicines you will be given for pain, especially if you are planning to breastfeed. What happens during the procedure? If you had a vaginal delivery:  You will be given one or more of the following: ? A medicine to help you relax (sedative). ? A medicine to numb the area (local anesthetic). ? A medicine to make you fall asleep (general anesthetic). ? A medicine that is injected into an area of your body to numb everything below the injection site (regional anesthetic).  If you have been given a general anesthetic, a tube will be put down your throat to help you breathe.  An IV will be inserted into one of your veins.  Your bladder may be emptied with a small tube (catheter).  An incision will be made just below your belly button.  Your fallopian tubes will be located and brought up through the incision.  Your fallopian tubes will be  tied off, burned (cauterized), or blocked with a clip, ring, or clamp. A small part in the center of each fallopian tube may be removed.  The incision will be closed with stitches (sutures).  A bandage (dressing) will be placed over the incision. If you had a cesarean delivery:  Tubal ligation will be done through the incision that was used for the cesarean delivery of your baby.  The incision will be closed with sutures.  A dressing will be placed over the incision. The procedure may vary  among health care providers and hospitals. What happens after the procedure?  Your blood pressure, heart rate, breathing rate, and blood oxygen level will be monitored until you leave the hospital.  You will be given pain medicine as needed.  Do not drive for 24 hours if you were given a sedative during your procedure. Summary  Postpartum tubal ligation is a procedure that closes the fallopian tubes so you cannot get pregnant anymore.  This procedure is done while you are still in the hospital after childbirth. If you have a cesarean section, it can be performed at the same time.  Having this done after childbirth does not make your stay in the hospital longer.  Postpartum tubal ligation is considered permanent. You should not have this procedure if you want to get pregnant again or if you are unsure about having more children.  Talk to your health care provider to see if this procedure is right for you. This information is not intended to replace advice given to you by your health care provider. Make sure you discuss any questions you have with your health care provider. Document Revised: 09/29/2018 Document Reviewed: 03/06/2018 Elsevier Patient Education  [place].

## 2020-02-01 NOTE — Progress Notes (Signed)
POSTPARTUM VISIT Patient name: [name] MRN [id number]  Date of birth: 1980/11/15 Chief Complaint:   Postpartum Care (wants tubal!!)  History of Present Illness:   [name] is a 39 y.o. [id number] African American female being seen today for a postpartum visit. She is 5 weeks postpartum following a spontaneous vaginal delivery at 38.3 gestational weeks. IOL: Yes, for cHTN w/ SIPE, A1DM. Anesthesia: epidural.  Laceration: none.  Complications: AKI intrapartum following mag, received multiple doses of lasix w/ good response. Normal creatinine w/ PCP on 9/13. Inpatient contraception: no.   Pregnancy complicated by CHTN, FGR, A1DM. Tobacco use: no. Substance use disorder: no. Last pap smear: 04/10/18 and results were normal. Next pap smear due: 2022 Patient's last menstrual period was 01/24/2020.  Postpartum course has been complicated by heavy bleeding x 2wks. Bleeding normal lochia x 2wks, stopped x 1, started back heavy 2wks ago. Changing 2 saturated overnight pads q2hrs, soiling clothes/sheets, small clots, no cramps/pain. Denies dizziness/lightheadedness/sob/cp, etc. H/O immediate PPH last pregnancy requiring D&C for retained products. Delivery note normal, no evidence of trailing membranes/incomplete placenta. Bowel function is normal. Bladder function is normal. Urinary incontinence? No, fecal incontinence? No Patient is not sexually active. Last sexual activity: prior to birth of baby.  Desired contraception: BTL. Patient does not want a pregnancy in the future.    Upstream - 02/01/20 1146      Pregnancy Intention Screening   Does the patient want to become pregnant in the next year? No    Does the patient's partner want to become pregnant in the next year? No      Contraception Wrap Up   Current Method Abstinence          The pregnancy intention screening data noted above was reviewed. Potential methods of contraception were discussed. The patient elected to  proceed with Abstinence until BTL  Edinburgh Postpartum Depression Screening: negative  Edinburgh Postnatal Depression Scale - 02/01/20 1138      Edinburgh Postnatal Depression Scale:  In the Past 7 Days   I have been able to laugh and see the funny side of things. 0    I have looked forward with enjoyment to things. 0    I have blamed myself unnecessarily when things went wrong. 0    I have been anxious or worried for no good reason. 0    I have felt scared or panicky for no good reason. 0    Things have been getting on top of me. 0    I have been so unhappy that I have had difficulty sleeping. 0    I have felt sad or miserable. 0    I have been so unhappy that I have been crying. 0    The thought of harming myself has occurred to me. 0    Edinburgh Postnatal Depression Scale Total 0          Baby's course has been uncomplicated. Baby is feeding by breast and bottle: milk supply inadequate . Infant has a pediatrician/family doctor? Yes.  Childcare strategy if returning to work/school: family.  Pt has material needs met for her and baby: Yes.   Review of Systems:   Pertinent items are noted in HPI Denies Abnormal vaginal discharge w/ itching/odor/irritation, headaches, visual changes, shortness of breath, chest pain, abdominal pain, severe nausea/vomiting, or problems with urination or bowel movements. Pertinent History Reviewed:  Reviewed past medical,surgical, obstetrical and family history.  Reviewed problem list, medications and allergies. OB History  Gravida Para Term Preterm AB Living  2 2 2    0 2  SAB TAB Ectopic Multiple Live Births  0     0 2    # Outcome Date GA Lbr Len/2nd Weight Sex Delivery Anes PTL Lv  2 Term 12/28/19 18w3d27:05 / 00:11 5 lb 12.9 oz (2.634 kg) F Vag-Spont EPI  LIV  1 Term 11/19/02 [id number] lb 14 oz (3.118 kg) M Vag-Spont EPI N LIV     Complications: Postpartum hemorrhage   Physical Assessment:   Vitals:   02/01/20 1140  BP: 125/83  Pulse:  (!) 102  Weight: 282 lb (127.9 kg)  Height: 5' 5"  (1.651 m)  Body mass index is 46.93 kg/m.       Physical Examination:   General appearance: alert, well appearing, and in no distress  Mental status: alert, oriented to person, place, and time  Skin: warm & dry   Cardiovascular: normal heart rate noted   Respiratory: normal respiratory effort, no distress   Breasts: deferred, no complaints   Abdomen: soft, non-tender   Pelvic: VULVA: normal appearing vulva with no masses, tenderness or lesions, VAGINA: normal appearing vagina with normal color and discharge, no lesions, vault full of blood, all cleared w/ fox swabs/4x4s CERVIX: normal appearing closed cervix without discharge or lesions, UTERUS: uterus is normal size, shape, consistency and nontender, ADNEXA: normal adnexa in size, nontender and no masses  Rectal: no hemorrhoids  Extremities: no edema  Fingerstick Hgb: 12.9, was 12.7 on CBC on 9/13       Results for orders placed or performed in visit on 02/01/20 (from the past 24 hour(s))  POCT hemoglobin   Collection Time: 02/01/20 12:27 PM  Result Value Ref Range   Hemoglobin 12.9 11 - 14.6 g/dL    Assessment & Plan:  1) Postpartum exam 2) 5 wks s/p spontaneous vaginal delivery after IOL for CHTN w/ SIPE, A1DM, FGR 3) breast & bottle feeding> tips given to increase milk supply 4) Depression screening 5) Contraception counseling>abstinence until BTL (consent 6/9), f/u w/ [facility] in 2wks for pre-op 6) Heavy vaginal bleeding> x 2wks, hgb stable, discussed w/ [facility], rx cytotec [id number] x 3d. To let us [place] if doesn't help/bleeding worsens/sx of anemia 7) A1DM during pregnancy> schedule pp GTT 8) CHTN> on norvasc and hctz, managed by PCP  Essential components of care per ACOG recommendations:  1.  Mood and well being:  . If positive depression screen, discussed and plan developed.  . If using tobacco we discussed reduction/cessation and risk of relapse . If current substance abuse,  we discussed and referral to local resources was offered.   2. Infant care and feeding:  . If breastfeeding, discussed returning to work, pumping, breastfeeding-associated pain, guidance regarding return to fertility while lactating if not using another method. If needed, patient was provided with a letter to be allowed to pump q 2-3hrs to support lactation in a private location with access to a refrigerator to store breastmilk.   . Recommended that all caregivers be immunized for flu, pertussis and other preventable communicable diseases . If pt does not have material needs met for her/baby, referred to local resources for help obtaining these.  3. Sexuality, contraception and birth spacing . Provided guidance regarding sexuality, management of dyspareunia, and resumption of intercourse . Discussed avoiding interpregnancy interval <6mt20mand recommended birth spacing of 18 months  4. Sleep and fatigue . Discussed coping options for fatigue and sleep disruption . Encouraged family/partner/community  support of 4 hrs of uninterrupted sleep to help with mood and fatigue  5. Physical recovery  . If pt had a C/S, assessed incisional pain and providing guidance on normal vs prolonged recovery . If pt had a laceration, perineal healing and pain reviewed.  . If urinary or fecal incontinence, discussed management and referred to PT or uro/gyn if indicated  . Patient is safe to resume physical activity. Discussed attainment of healthy weight.  6.  Chronic disease management . Discussed pregnancy complications if any, and their implications for future childbearing and long-term maternal health. . Review recommendations for prevention of recurrent pregnancy complications, such as 17 hydroxyprogesterone caproate to reduce risk for recurrent PTB not applicable, or aspirin to reduce risk of preeclampsia n/a- getting BTL. . Pt had GDM: Yes. If yes, 2hr GTT scheduled: yes. . Reviewed medications and  non-pregnant dosing including consideration of whether pt is breastfeeding using a reliable resource such as LactMed: yes . Referred for f/u w/ PCP or subspecialist providers as indicated: continue f/u w/ PCP  7. Health maintenance . Mammogram at 39yo or earlier if indicated . Pap smears as indicated  Meds:  Meds ordered this encounter  Medications  . misoprostol (CYTOTEC) 200 MCG tablet    Sig: Take 1 tablet (200 mcg total) by mouth 3 (three) times daily. X 3 days    Dispense:  9 tablet    Refill:  0    Order Specific Question:   Supervising Provider    Answer:   Florian Buff [[id number]]    Follow-up: Return in about 2 weeks (around 02/15/2020) for sugar test and pre-op w/ [facility].   Orders Placed This Encounter  Procedures  . POCT hemoglobin    Roma Schanz CNM, [facility] 02/01/2020

## 2020-02-02 ENCOUNTER — Other Ambulatory Visit: Payer: Self-pay | Admitting: Women's Health

## 2020-02-02 MED ORDER — AMOXICILLIN-POT CLAVULANATE 875-125 MG PO TABS: 1.0000 | ORAL_TABLET | Freq: Two times a day (BID) | ORAL | 0 refills | Status: DC

## 2020-02-08 ENCOUNTER — Encounter: Payer: Self-pay | Admitting: *Deleted

## 2020-02-23 ENCOUNTER — Encounter: Payer: Self-pay | Admitting: Obstetrics & Gynecology

## 2020-02-23 ENCOUNTER — Ambulatory Visit (INDEPENDENT_AMBULATORY_CARE_PROVIDER_SITE_OTHER): Payer: Medicaid Other | Admitting: Obstetrics & Gynecology

## 2020-02-23 ENCOUNTER — Other Ambulatory Visit: Payer: Medicaid Other

## 2020-02-23 VITALS — BP 130/91 | HR 86 | Ht 65.0 in | Wt 283.0 lb

## 2020-02-23 DIAGNOSIS — Z8632 Personal history of gestational diabetes: Secondary | ICD-10-CM | POA: Diagnosis not present

## 2020-02-23 DIAGNOSIS — Z3009 Encounter for other general counseling and advice on contraception: Secondary | ICD-10-CM

## 2020-02-23 NOTE — Progress Notes (Signed)
Preoperative History and Physical  [name] is a 39 y.o. [id number] with Patient's last menstrual period was 01/24/2020. admitted for a laparoscopic bilateral salpingectomy for .permanent sterilization Opts for salpingectomy  PMH:    Past Medical History:  Diagnosis Date  . Allergy    banana/pineapple  . Anemia    post pregnancy  . Anxiety   . Asthma   . Autoimmune disease, not elsewhere classified(279.49) 2013   Non specific- [facility], abnormal Bone Marrow signal  . Breast lump in female 11/28/2012   Has tender round mass at 10-11 0'clock right breast 4 finger breaths from [place] will [place]  . Breast mass, right 08/18/2012  . Chlamydia   . Chronic breast pain 05/31/2015  . Dysmenorrhea 03/23/2014  . Dyspareunia 01/05/2014  . Fibroids, intramural 08/24/2015  . GERD (gastroesophageal reflux disease)   . Gonorrhea   . Headache(784.0)   . History of abnormal cervical Pap smear 04/02/2014  . Hypertension   . LLQ abdominal tenderness 05/31/2015  . Menorrhagia 01/05/2014  . Neuromuscular disorder ([place])    lower back and bilat leg pain  . Obesity   . Other and unspecified ovarian cyst 01/12/2014  . Polyclonal gammopathy 07/26/2013   Insignificant  . Recurrent upper respiratory infection (URI)   . Scoliosis   . Urticaria   . Vaginal irritation 09/30/2014  . Vaginal Pap smear, abnormal     PSH:     Past Surgical History:  Procedure Laterality Date  . ADENOIDECTOMY    . DILATION AND CURETTAGE OF UTERUS    . SHOULDER SURGERY Right 03/10/2019  . TONSILLECTOMY      POb/GynH:      OB History    Gravida  2   Para  2   Term  2   Preterm      AB  0   Living  2     SAB  0   TAB      Ectopic      Multiple  0   Live Births  2           SH:   Social History   Tobacco Use  . Smoking status: Never Smoker  . Smokeless tobacco: Never Used  Vaping Use  . Vaping Use: Never used  Substance Use Topics  . Alcohol use: No  . Drug use: No    FH:     Family History  Problem Relation Age of Onset  . Hypertension Mother   . Diabetes Mother   . Heart disease Mother   . Diabetes Father   . Cancer Paternal Aunt        breast  . Hypertension Maternal Grandmother   . Diabetes Son        pre diabetic  . Other Son        overactive bladder  . COPD Maternal Aunt   . Leukemia Maternal Aunt   . Allergic rhinitis Neg Hx   . Angioedema Neg Hx   . Asthma Neg Hx   . Atopy Neg Hx   . Eczema Neg Hx   . Immunodeficiency Neg Hx   . Urticaria Neg Hx      Allergies:  Allergies  Allergen Reactions  . Banana Rash  . Pineapple Rash    Medications:       Current Outpatient Medications:  .  albuterol (PROVENTIL HFA) 108 (90 Base) MCG/ACT inhaler, Use 4 puffs every 4-6 hours as needed for cough or wheeze., Disp: 18 g, Rfl:  2 .  amLODipine (NORVASC) 10 MG tablet, Take 1 tablet (10 mg total) by mouth daily., Disp: 30 tablet, Rfl: 3 .  Blood Pressure Monitoring (BLOOD PRESSURE CUFF) MISC, 1 Device by Does not apply route once a week. To check pressure 3-5 times weekly (Patient taking differently: 1 Device by Does not apply route See admin instructions. To check pressure 3-5 times weekly), Disp: 1 each, Rfl: 0 .  flunisolide (NASALIDE) 25 MCG/ACT (0.025%) SOLN, Place 2 sprays into the nose 2 (two) times daily. (Patient taking differently: Place 2 sprays into the nose as needed. ), Disp: 25 mL, Rfl: 5 .  hydrochlorothiazide (HYDRODIURIL) 25 MG tablet, Take 1 tablet (25 mg total) by mouth daily., Disp: 30 tablet, Rfl: 0 .  Prenatal Vit-DSS-Fe Cbn-FA (PRENATAL AD PO), Take 1 tablet by mouth daily. , Disp: , Rfl:  .  senna-docusate (SENOKOT-S) 8.6-50 MG tablet, Take 2 tablets by mouth at bedtime as needed for mild constipation., Disp: 20 tablet, Rfl: 1  Review of Systems:   Review of Systems  Constitutional: Negative for fever, chills, weight loss, malaise/fatigue and diaphoresis.  HENT: Negative for hearing loss, ear pain, nosebleeds, congestion,  sore throat, neck pain, tinnitus and ear discharge.   Eyes: Negative for blurred vision, double vision, photophobia, pain, discharge and redness.  Respiratory: Negative for cough, hemoptysis, sputum production, shortness of breath, wheezing and stridor.   Cardiovascular: Negative for chest pain, palpitations, orthopnea, claudication, leg swelling and PND.  Gastrointestinal: Positive for abdominal pain. Negative for heartburn, nausea, vomiting, diarrhea, constipation, blood in stool and melena.  Genitourinary: Negative for dysuria, urgency, frequency, hematuria and flank pain.  Musculoskeletal: Negative for myalgias, back pain, joint pain and falls.  Skin: Negative for itching and rash.  Neurological: Negative for dizziness, tingling, tremors, sensory change, speech change, focal weakness, seizures, loss of consciousness, weakness and headaches.  Endo/Heme/Allergies: Negative for environmental allergies and polydipsia. Does not bruise/bleed easily.  Psychiatric/Behavioral: Negative for depression, suicidal ideas, hallucinations, memory loss and substance abuse. The patient is not nervous/anxious and does not have insomnia.      PHYSICAL EXAM:  Blood pressure (!) 130/91, pulse 86, height 5\' 5"  (1.651 m), weight 283 lb (128.4 kg), last menstrual period 01/24/2020, currently breastfeeding.    Vitals reviewed. Constitutional: She is oriented to person, place, and time. She appears well-developed and well-nourished.  HENT:  Head: Normocephalic and atraumatic.  Right Ear: External ear normal.  Left Ear: External ear normal.  Nose: Nose normal.  Mouth/Throat: Oropharynx is clear and moist.  Eyes: Conjunctivae and EOM are normal. Pupils are equal, round, and reactive to light. Right eye exhibits no discharge. Left eye exhibits no discharge. No scleral icterus.  Neck: Normal range of motion. Neck supple. No tracheal deviation present. No thyromegaly present.  Cardiovascular: Normal rate, regular  rhythm, normal heart sounds and intact distal pulses.  Exam reveals no gallop and no friction rub.   No murmur heard. Respiratory: Effort normal and breath sounds normal. No respiratory distress. She has no wheezes. She has no rales. She exhibits no tenderness.  GI: Soft. Bowel sounds are normal. She exhibits no distension and no mass. There is tenderness. There is no rebound and no guarding.  Genitourinary:       Vulva is normal without lesions Vagina is pink moist without discharge Cervix normal in appearance and pap is normal Uterus is normal size, contour, position, consistency, mobility, non-tender Adnexa is negative with normal sized ovaries by sonogram  Musculoskeletal: Normal range of motion.  She exhibits no edema and no tenderness.  Neurological: She is alert and oriented to person, place, and time. She has normal reflexes. She displays normal reflexes. No cranial nerve deficit. She exhibits normal muscle tone. Coordination normal.  Skin: Skin is warm and dry. No rash noted. No erythema. No pallor.  Psychiatric: She has a normal mood and affect. Her behavior is normal. Judgment and thought content normal.    Labs: No results found for this or any previous visit (from the past 336 hour(s)).  EKG:   Imaging Studies: No results found.    Assessment: Multiparous female desires permanent sterilization  Plan: Laparoscopic bilateral salpingectomy for permanent sterilization, 03/16/20 tentatively  Florian Buff 02/23/2020 10:12 AM     Face to face time:  20 minutes  Greater than 50% of the visit time was spent in counseling and coordination of care with the patient.  The summary and outline of the counseling and care coordination is summarized in the note above.   All questions were answered.

## 2020-02-24 LAB — GLUCOSE TOLERANCE, 2 HOURS W/ 1HR
Glucose, 1 hour: 129 mg/dL (ref 65–179)
Glucose, 2 hour: 100 mg/dL (ref 65–152)
Glucose, Fasting: 94 mg/dL — ABNORMAL HIGH (ref 65–91)

## 2020-02-29 ENCOUNTER — Other Ambulatory Visit: Payer: Self-pay | Admitting: Women's Health

## 2020-02-29 DIAGNOSIS — Z8632 Personal history of gestational diabetes: Secondary | ICD-10-CM

## 2020-02-29 DIAGNOSIS — R7309 Other abnormal glucose: Secondary | ICD-10-CM

## 2020-03-09 ENCOUNTER — Other Ambulatory Visit (HOSPITAL_COMMUNITY): Payer: Self-pay | Admitting: Nephrology

## 2020-03-09 DIAGNOSIS — N170 Acute kidney failure with tubular necrosis: Secondary | ICD-10-CM

## 2020-03-14 ENCOUNTER — Other Ambulatory Visit: Payer: Self-pay | Admitting: Obstetrics & Gynecology

## 2020-03-14 NOTE — Patient Instructions (Signed)
[name]  03/14/2020     @PREFPERIOPPHARMACY @   Your procedure is scheduled on  03/16/2020.  Report to [facility] at  1130   A.M.  Call this number if you have problems the morning of surgery:  [phone]   Remember:  Do not eat or drink after midnight.                         Take these medicines the morning of surgery with A SIP OF WATER  Amlodipine. Use your inhaler before you come.    Do not wear jewelry, make-up or nail polish.  Do not wear lotions, powders, or perfumes. Please wear deodorant and brush your teeth.  Do not shave 48 hours prior to surgery.  Men may shave face and neck.  Do not bring valuables to the hospital.  [facility] is not responsible for any belongings or valuables.  Contacts, dentures or bridgework may not be worn into surgery.  Leave your suitcase in the car.  After surgery it may be brought to your room.  For patients admitted to the hospital, discharge time will be determined by your treatment team.  Patients discharged the day of surgery will not be allowed to drive home.   Name and phone number of your driver:   family Special instructions:  DO NOT smoke the morning of your procedure.  Please read over the following fact sheets that you were given. Anesthesia Post-op Instructions and Care and Recovery After Surgery       Bilateral Salpingo-Oophorectomy, Care After This sheet gives you information about how to care for yourself after your procedure. Your health care provider may also give you more specific instructions. If you have problems or questions, contact your health care provider. What can I expect after the procedure? After the procedure, it is common to have:  Abdominal pain.  Some occasional vaginal bleeding (spotting).  Tiredness.  Symptoms of menopause, such as hot flashes, night sweats, or mood swings. Follow these instructions at home: Incision care   Keep your incision area and your  bandage (dressing) clean and dry.  Follow instructions from your health care provider about how to take care of your incision. Make sure you: ? Wash your hands with soap and water before you change your dressing. If soap and water are not available, use hand sanitizer. ? Change your dressing as told by your health care provider. ? Leave stitches (sutures), staples, skin glue, or adhesive strips in place. These skin closures may need to stay in place for 2 weeks or longer. If adhesive strip edges start to loosen and curl up, you may trim the loose edges. Do not remove adhesive strips completely unless your health care provider tells you to do that.  Check your incision area every day for signs of infection. Check for: ? Redness, swelling, or pain. ? Fluid or blood. ? Warmth. ? Pus or a bad smell. Activity   Do not drive or use heavy machinery while taking prescription pain medicine.  Do not drive for 24 hours if you received a medicine to help you relax (sedative) during your procedure.  Take frequent, short walks throughout the day. Rest when you get tired. Ask your health care provider what activities are safe for you.  Avoid activity that requires great effort. Also, avoid heavy lifting. Do not lift anything that is heavier than 10 lbs. (4.5 kg), or  the limit that your health care provider tells you, until he or she says that it is safe to do so.  Do not douche, use tampons, or have sex until your health care provider approves. General instructions   To prevent or treat constipation while you are taking prescription pain medicine, your health care provider may recommend that you: ? Drink enough fluid to keep your urine clear or pale yellow. ? Take over-the-counter or prescription medicines. ? Eat foods that are high in fiber, such as fresh fruits and vegetables, whole grains, and beans. ? Limit foods that are high in fat and processed sugars, such as fried and sweet foods.  Take  over-the-counter and prescription medicines only as told by your health care provider.  Do not take baths, swim, or use a hot tub until your health care provider approves. Ask your health care provider if you can take showers. You may only be allowed to take sponge baths for bathing.  Wear compression stockings as told by your health care provider. These stockings help to prevent blood clots and reduce swelling in your legs.  Keep all follow-up visits as told by your health care provider. This is important. Contact a health care provider if:  You have pain when you urinate.  You have pus or a bad smelling discharge coming from your vagina.  You have redness, swelling, or pain around your incision.  You have fluid or blood coming from your incision.  Your incision feels warm to the touch.  You have pus or a bad smell coming from your incision.  You have a fever.  Your incision starts to break open.  You have pain in the abdomen, and it gets worse or does not get better when you take medicine.  You develop a rash.  You develop nausea and vomiting.  You feel lightheaded. Get help right away if:  You develop pain in your chest or leg.  You become short of breath.  You faint.  You have increased bleeding from your vagina. Summary  After the procedure, it is common to have pain, bleeding in the vagina, and symptoms of menopause.  Follow instructions from your health care provider about how to take care of your incision.  Follow instructions from your health care provider about activities and restrictions.  Check your incision every day for signs of infection and report any symptoms to your health care provider. This information is not intended to replace advice given to you by your health care provider. Make sure you discuss any questions you have with your health care provider. Document Revised: 06/20/2018 Document Reviewed: 05/21/2016 Elsevier Patient Education  2020  [place] Anesthesia, Adult, Care After This sheet gives you information about how to care for yourself after your procedure. Your health care provider may also give you more specific instructions. If you have problems or questions, contact your health care provider. What can I expect after the procedure? After the procedure, the following side effects are common:  Pain or discomfort at the IV site.  Nausea.  Vomiting.  Sore throat.  Trouble concentrating.  Feeling cold or chills.  Weak or tired.  Sleepiness and fatigue.  Soreness and body aches. These side effects can affect parts of the body that were not involved in surgery. Follow these instructions at home:  For at least 24 hours after the procedure:  Have a responsible adult stay with you. It is important to have someone help care for you until  you are awake and alert.  Rest as needed.  Do not: ? Participate in activities in which you could fall or become injured. ? Drive. ? Use heavy machinery. ? Drink alcohol. ? Take sleeping pills or medicines that cause drowsiness. ? Make important decisions or sign legal documents. ? Take care of children on your own. Eating and drinking  Follow any instructions from your health care provider about eating or drinking restrictions.  When you feel hungry, start by eating small amounts of foods that are soft and easy to digest (bland), such as toast. Gradually return to your regular diet.  Drink enough fluid to keep your urine pale yellow.  If you vomit, rehydrate by drinking water, juice, or clear broth. General instructions  If you have sleep apnea, surgery and certain medicines can increase your risk for breathing problems. Follow instructions from your health care provider about wearing your sleep device: ? Anytime you are sleeping, including during daytime naps. ? While taking prescription pain medicines, sleeping medicines, or medicines that make you  drowsy.  Return to your normal activities as told by your health care provider. Ask your health care provider what activities are safe for you.  Take over-the-counter and prescription medicines only as told by your health care provider.  If you smoke, do not smoke without supervision.  Keep all follow-up visits as told by your health care provider. This is important. Contact a health care provider if:  You have nausea or vomiting that does not get better with medicine.  You cannot eat or drink without vomiting.  You have pain that does not get better with medicine.  You are unable to pass urine.  You develop a skin rash.  You have a fever.  You have redness around your IV site that gets worse. Get help right away if:  You have difficulty breathing.  You have chest pain.  You have blood in your urine or stool, or you vomit blood. Summary  After the procedure, it is common to have a sore throat or nausea. It is also common to feel tired.  Have a responsible adult stay with you for the first 24 hours after general anesthesia. It is important to have someone help care for you until you are awake and alert.  When you feel hungry, start by eating small amounts of foods that are soft and easy to digest (bland), such as toast. Gradually return to your regular diet.  Drink enough fluid to keep your urine pale yellow.  Return to your normal activities as told by your health care provider. Ask your health care provider what activities are safe for you. This information is not intended to replace advice given to you by your health care provider. Make sure you discuss any questions you have with your health care provider. Document Revised: 04/19/2017 Document Reviewed: 11/30/2016 Elsevier Patient Education  [place]. How to Use Chlorhexidine for Bathing Chlorhexidine gluconate (CHG) is a germ-killing (antiseptic) solution that is used to clean the skin. It can get rid of  the bacteria that normally live on the skin and can keep them away for about 24 hours. To clean your skin with CHG, you may be given:  A CHG solution to use in the shower or as part of a sponge bath.  A prepackaged cloth that contains CHG. Cleaning your skin with CHG may help lower the risk for infection:  While you are staying in the intensive care unit of the hospital.  If you have a vascular access, such as a central line, to provide short-term or long-term access to your veins.  If you have a catheter to drain urine from your bladder.  If you are on a ventilator. A ventilator is a machine that helps you breathe by moving air in and out of your lungs.  After surgery. What are the risks? Risks of using CHG include:  A skin reaction.  Hearing loss, if CHG gets in your ears.  Eye injury, if CHG gets in your eyes and is not rinsed out.  The CHG product catching fire. Make sure that you avoid smoking and flames after applying CHG to your skin. Do not use CHG:  If you have a chlorhexidine allergy or have previously reacted to chlorhexidine.  On babies younger than 13 months of age. How to use CHG solution  Use CHG only as told by your health care provider, and follow the instructions on the label.  Use the full amount of CHG as directed. Usually, this is one bottle. During a shower Follow these steps when using CHG solution during a shower (unless your health care provider gives you different instructions): 1. Start the shower. 2. Use your normal soap and shampoo to wash your face and hair. 3. Turn off the shower or move out of the shower stream. 4. Pour the CHG onto a clean washcloth. Do not use any type of brush or rough-edged sponge. 5. Starting at your neck, lather your body down to your toes. Make sure you follow these instructions: ? If you will be having surgery, pay special attention to the part of your body where you will be having surgery. Scrub this area for at  least 1 minute. ? Do not use CHG on your head or face. If the solution gets into your ears or eyes, rinse them well with water. ? Avoid your genital area. ? Avoid any areas of skin that have broken skin, cuts, or scrapes. ? Scrub your back and under your arms. Make sure to wash skin folds. 6. Let the lather sit on your skin for 1-2 minutes or as long as told by your health care provider. 7. Thoroughly rinse your entire body in the shower. Make sure that all body creases and crevices are rinsed well. 8. Dry off with a clean towel. Do not put any substances on your body afterward--such as powder, lotion, or perfume--unless you are told to do so by your health care provider. Only use lotions that are recommended by the manufacturer. 9. Put on clean clothes or pajamas. 10. If it is the night before your surgery, sleep in clean sheets.  During a sponge bath Follow these steps when using CHG solution during a sponge bath (unless your health care provider gives you different instructions): 1. Use your normal soap and shampoo to wash your face and hair. 2. Pour the CHG onto a clean washcloth. 3. Starting at your neck, lather your body down to your toes. Make sure you follow these instructions: ? If you will be having surgery, pay special attention to the part of your body where you will be having surgery. Scrub this area for at least 1 minute. ? Do not use CHG on your head or face. If the solution gets into your ears or eyes, rinse them well with water. ? Avoid your genital area. ? Avoid any areas of skin that have broken skin, cuts, or scrapes. ? Scrub your back and under your arms. Make  sure to wash skin folds. 4. Let the lather sit on your skin for 1-2 minutes or as long as told by your health care provider. 5. Using a different clean, wet washcloth, thoroughly rinse your entire body. Make sure that all body creases and crevices are rinsed well. 6. Dry off with a clean towel. Do not put any  substances on your body afterward--such as powder, lotion, or perfume--unless you are told to do so by your health care provider. Only use lotions that are recommended by the manufacturer. 7. Put on clean clothes or pajamas. 8. If it is the night before your surgery, sleep in clean sheets. How to use CHG prepackaged cloths  Only use CHG cloths as told by your health care provider, and follow the instructions on the label.  Use the CHG cloth on clean, dry skin.  Do not use the CHG cloth on your head or face unless your health care provider tells you to.  When washing with the CHG cloth: ? Avoid your genital area. ? Avoid any areas of skin that have broken skin, cuts, or scrapes. Before surgery Follow these steps when using a CHG cloth to clean before surgery (unless your health care provider gives you different instructions): 1. Using the CHG cloth, vigorously scrub the part of your body where you will be having surgery. Scrub using a back-and-forth motion for 3 minutes. The area on your body should be completely wet with CHG when you are done scrubbing. 2. Do not rinse. Discard the cloth and let the area air-dry. Do not put any substances on the area afterward, such as powder, lotion, or perfume. 3. Put on clean clothes or pajamas. 4. If it is the night before your surgery, sleep in clean sheets.  For general bathing Follow these steps when using CHG cloths for general bathing (unless your health care provider gives you different instructions). 1. Use a separate CHG cloth for each area of your body. Make sure you wash between any folds of skin and between your fingers and toes. Wash your body in the following order, switching to a new cloth after each step: ? The front of your neck, shoulders, and chest. ? Both of your arms, under your arms, and your hands. ? Your stomach and groin area, avoiding the genitals. ? Your right leg and foot. ? Your left leg and foot. ? The back of your neck,  your back, and your buttocks. 2. Do not rinse. Discard the cloth and let the area air-dry. Do not put any substances on your body afterward--such as powder, lotion, or perfume--unless you are told to do so by your health care provider. Only use lotions that are recommended by the manufacturer. 3. Put on clean clothes or pajamas. Contact a health care provider if:  Your skin gets irritated after scrubbing.  You have questions about using your solution or cloth. Get help right away if:  Your eyes become very red or swollen.  Your eyes itch badly.  Your skin itches badly and is red or swollen.  Your hearing changes.  You have trouble seeing.  You have swelling or tingling in your mouth or throat.  You have trouble breathing.  You swallow any chlorhexidine. Summary  Chlorhexidine gluconate (CHG) is a germ-killing (antiseptic) solution that is used to clean the skin. Cleaning your skin with CHG may help to lower your risk for infection.  You may be given CHG to use for bathing. It may be in a bottle  or in a prepackaged cloth to use on your skin. Carefully follow your health care provider's instructions and the instructions on the product label.  Do not use CHG if you have a chlorhexidine allergy.  Contact your health care provider if your skin gets irritated after scrubbing. This information is not intended to replace advice given to you by your health care provider. Make sure you discuss any questions you have with your health care provider. Document Revised: 07/03/2018 Document Reviewed: 03/14/2017 Elsevier Patient Education  [place].

## 2020-03-15 ENCOUNTER — Encounter (HOSPITAL_COMMUNITY): Payer: Self-pay

## 2020-03-15 ENCOUNTER — Encounter (HOSPITAL_COMMUNITY)
Admission: RE | Admit: 2020-03-15 | Discharge: 2020-03-15 | Disposition: A | Discharge status: Home / Self Care | Payer: Medicaid Other | Source: Ambulatory Visit | Attending: Obstetrics & Gynecology | Admitting: Obstetrics & Gynecology

## 2020-03-15 ENCOUNTER — Other Ambulatory Visit: Payer: Self-pay

## 2020-03-15 ENCOUNTER — Other Ambulatory Visit (HOSPITAL_COMMUNITY): Payer: Medicaid Other | Attending: Obstetrics & Gynecology

## 2020-03-15 DIAGNOSIS — Z01818 Encounter for other preprocedural examination: Secondary | ICD-10-CM | POA: Diagnosis not present

## 2020-03-15 DIAGNOSIS — Z20822 Contact with and (suspected) exposure to covid-19: Secondary | ICD-10-CM | POA: Diagnosis not present

## 2020-03-15 LAB — COMPREHENSIVE METABOLIC PANEL
ALT: 11 U/L (ref 0–44)
AST: 12 U/L — ABNORMAL LOW (ref 15–41)
Albumin: 3.6 g/dL (ref 3.5–5.0)
Alkaline Phosphatase: 82 U/L (ref 38–126)
Anion gap: 8 (ref 5–15)
BUN: 6 mg/dL (ref 6–20)
CO2: 27 mmol/L (ref 22–32)
Calcium: 8.7 mg/dL — ABNORMAL LOW (ref 8.9–10.3)
Chloride: 103 mmol/L (ref 98–111)
Creatinine, Ser: 0.76 mg/dL (ref 0.44–1.00)
GFR, Estimated: >60 mL/min (ref >60)
Glucose, Bld: 105 mg/dL — ABNORMAL HIGH (ref 70–99)
Potassium: 3.4 mmol/L — ABNORMAL LOW (ref 3.5–5.1)
Sodium: 138 mmol/L (ref 135–145)
Total Bilirubin: 0.7 mg/dL (ref 0.3–1.2)
Total Protein: 7.1 g/dL (ref 6.5–8.1)

## 2020-03-15 LAB — CBC
HCT: 35.7 % — ABNORMAL LOW (ref 36.0–46.0)
Hemoglobin: 11.6 g/dL — ABNORMAL LOW (ref 12.0–15.0)
MCH: 28.7 pg (ref 26.0–34.0)
MCHC: 32.5 g/dL (ref 30.0–36.0)
MCV: 88.4 fL (ref 80.0–100.0)
Platelets: 371 10*3/uL (ref 150–400)
RBC: 4.04 MIL/uL (ref 3.87–5.11)
RDW: 13 % (ref 11.5–15.5)
WBC: 7.9 10*3/uL (ref 4.0–10.5)
nRBC: 0 % (ref 0.0–0.2)

## 2020-03-15 LAB — URINALYSIS, ROUTINE W REFLEX MICROSCOPIC
Bilirubin Urine: NEGATIVE
Glucose, UA: NEGATIVE mg/dL
Hgb urine dipstick: NEGATIVE
Ketones, ur: NEGATIVE mg/dL
Nitrite: NEGATIVE
Protein, ur: 30 mg/dL — AB
Specific Gravity, Urine: 1.016 (ref 1.005–1.030)
pH: 8 (ref 5.0–8.0)

## 2020-03-15 LAB — HEMOGLOBIN A1C
Hgb A1c MFr Bld: 4.8 % (ref 4.8–5.6)
Mean Plasma Glucose: 91.06 mg/dL

## 2020-03-15 LAB — HCG, QUANTITATIVE, PREGNANCY: hCG, Beta Chain, Quant, S: <1 m[IU]/mL (ref <5)

## 2020-03-16 ENCOUNTER — Encounter (HOSPITAL_COMMUNITY)
Admission: RE | Disposition: A | Discharge status: Home / Self Care | Payer: Self-pay | Source: Home / Self Care | Attending: Obstetrics & Gynecology

## 2020-03-16 ENCOUNTER — Ambulatory Visit (HOSPITAL_COMMUNITY): Payer: Medicaid Other | Admitting: Anesthesiology

## 2020-03-16 ENCOUNTER — Other Ambulatory Visit: Payer: Self-pay

## 2020-03-16 ENCOUNTER — Encounter (HOSPITAL_COMMUNITY): Payer: Self-pay | Admitting: Obstetrics & Gynecology

## 2020-03-16 ENCOUNTER — Ambulatory Visit (HOSPITAL_COMMUNITY)
Admission: RE | Admit: 2020-03-16 | Discharge: 2020-03-16 | Disposition: A | Discharge status: Home / Self Care | Payer: Medicaid Other | Attending: Obstetrics & Gynecology | Admitting: Obstetrics & Gynecology

## 2020-03-16 DIAGNOSIS — Z302 Encounter for sterilization: Secondary | ICD-10-CM | POA: Insufficient documentation

## 2020-03-16 DIAGNOSIS — Z79899 Other long term (current) drug therapy: Secondary | ICD-10-CM | POA: Insufficient documentation

## 2020-03-16 DIAGNOSIS — Z641 Problems related to multiparity: Secondary | ICD-10-CM | POA: Diagnosis not present

## 2020-03-16 HISTORY — PX: LAPAROSCOPIC BILATERAL SALPINGECTOMY: SHX5889

## 2020-03-16 LAB — SARS CORONAVIRUS 2 (TAT 6-24 HRS): SARS Coronavirus 2: NEGATIVE

## 2020-03-16 SURGERY — SALPINGECTOMY, BILATERAL, LAPAROSCOPIC
Anesthesia: General | Laterality: Bilateral

## 2020-03-16 MED ORDER — ONDANSETRON 8 MG PO TBDP: 8.0000 mg | ORAL_TABLET | Freq: Three times a day (TID) | ORAL | 0 refills | Status: DC | PRN

## 2020-03-16 MED ORDER — LIDOCAINE HCL (CARDIAC) PF 50 MG/5ML IV SOSY
PREFILLED_SYRINGE | INTRAVENOUS | Status: DC | PRN
  Administered 2020-03-16: 100 mg via INTRAVENOUS

## 2020-03-16 MED ORDER — SUCCINYLCHOLINE 20MG/ML (10ML) SYRINGE FOR MEDFUSION PUMP - OPTIME
INTRAMUSCULAR | Status: DC | PRN
  Administered 2020-03-16: 140 mg via INTRAVENOUS

## 2020-03-16 MED ORDER — BUPIVACAINE LIPOSOME 1.3 % IJ SUSP
INTRAMUSCULAR | Status: AC
  Filled 2020-03-16: qty 20

## 2020-03-16 MED ORDER — FENTANYL CITRATE (PF) 250 MCG/5ML IJ SOLN
INTRAMUSCULAR | Status: AC
  Filled 2020-03-16: qty 5

## 2020-03-16 MED ORDER — ORAL CARE MOUTH RINSE: 15.0000 mL | Freq: Once | OROMUCOSAL | Status: AC

## 2020-03-16 MED ORDER — CHLORHEXIDINE GLUCONATE 0.12 % MT SOLN
15.0000 mL | Freq: Once | OROMUCOSAL | Status: AC
  Administered 2020-03-16: 15 mL via OROMUCOSAL

## 2020-03-16 MED ORDER — SUGAMMADEX SODIUM 200 MG/2ML IV SOLN
INTRAVENOUS | Status: DC | PRN
  Administered 2020-03-16: 400 mg via INTRAVENOUS

## 2020-03-16 MED ORDER — ONDANSETRON HCL 4 MG/2ML IJ SOLN
INTRAMUSCULAR | Status: AC
  Filled 2020-03-16: qty 2

## 2020-03-16 MED ORDER — HYDROCODONE-ACETAMINOPHEN 5-325 MG PO TABS
1.0000 | ORAL_TABLET | Freq: Four times a day (QID) | ORAL | 0 refills | Status: DC | PRN
Start: 2020-03-16 — End: 2020-04-18

## 2020-03-16 MED ORDER — HYDROMORPHONE HCL 1 MG/ML IJ SOLN
0.2500 mg | INTRAMUSCULAR | Status: DC | PRN
  Administered 2020-03-16 (×2): 0.5 mg via INTRAVENOUS
  Filled 2020-03-16 (×2): qty 0.5

## 2020-03-16 MED ORDER — DEXAMETHASONE SODIUM PHOSPHATE 10 MG/ML IJ SOLN
INTRAMUSCULAR | Status: DC | PRN
  Administered 2020-03-16: 8 mg via INTRAVENOUS

## 2020-03-16 MED ORDER — SODIUM CHLORIDE 0.9 % IR SOLN
Status: DC | PRN
  Administered 2020-03-16: 1000 mL

## 2020-03-16 MED ORDER — BUPIVACAINE LIPOSOME 1.3 % IJ SUSP: 20.0000 mL | Freq: Once | INTRAMUSCULAR | Status: DC

## 2020-03-16 MED ORDER — FENTANYL CITRATE (PF) 100 MCG/2ML IJ SOLN
INTRAMUSCULAR | Status: DC | PRN
  Administered 2020-03-16: 100 ug via INTRAVENOUS
  Administered 2020-03-16 (×4): 50 ug via INTRAVENOUS

## 2020-03-16 MED ORDER — CEFAZOLIN SODIUM-DEXTROSE 2-4 GM/100ML-% IV SOLN
3.0000 g | INTRAVENOUS | Status: AC
  Administered 2020-03-16: 2 g via INTRAVENOUS

## 2020-03-16 MED ORDER — MEPERIDINE HCL 50 MG/ML IJ SOLN: 6.2500 mg | INTRAMUSCULAR | Status: DC | PRN

## 2020-03-16 MED ORDER — PROPOFOL 10 MG/ML IV BOLUS
INTRAVENOUS | Status: DC | PRN
  Administered 2020-03-16: 200 mg via INTRAVENOUS

## 2020-03-16 MED ORDER — MIDAZOLAM HCL 2 MG/2ML IJ SOLN
INTRAMUSCULAR | Status: AC
  Filled 2020-03-16: qty 2

## 2020-03-16 MED ORDER — KETOROLAC TROMETHAMINE 30 MG/ML IJ SOLN
30.0000 mg | Freq: Once | INTRAMUSCULAR | Status: AC
  Administered 2020-03-16: 30 mg via INTRAVENOUS

## 2020-03-16 MED ORDER — MIDAZOLAM HCL 5 MG/5ML IJ SOLN
INTRAMUSCULAR | Status: DC | PRN
  Administered 2020-03-16: 2 mg via INTRAVENOUS

## 2020-03-16 MED ORDER — ONDANSETRON HCL 4 MG/2ML IJ SOLN
INTRAMUSCULAR | Status: DC | PRN
  Administered 2020-03-16: 4 mg via INTRAVENOUS

## 2020-03-16 MED ORDER — BUPIVACAINE LIPOSOME 1.3 % IJ SUSP
INTRAMUSCULAR | Status: DC | PRN
  Administered 2020-03-16: 20 mL

## 2020-03-16 MED ORDER — POVIDONE-IODINE 10 % EX SWAB: 2.0000 "application " | Freq: Once | CUTANEOUS | Status: DC

## 2020-03-16 MED ORDER — LACTATED RINGERS IV SOLN
Freq: Once | INTRAVENOUS | Status: AC
  Administered 2020-03-16: 1000 mL via INTRAVENOUS

## 2020-03-16 MED ORDER — PHENYLEPHRINE HCL (PRESSORS) 10 MG/ML IV SOLN
INTRAVENOUS | Status: DC | PRN
  Administered 2020-03-16: 80 ug via INTRAVENOUS

## 2020-03-16 MED ORDER — KETOROLAC TROMETHAMINE 10 MG PO TABS: 10.0000 mg | ORAL_TABLET | Freq: Three times a day (TID) | ORAL | 0 refills | Status: DC | PRN

## 2020-03-16 MED ORDER — LACTATED RINGERS IV SOLN: INTRAVENOUS | Status: DC | PRN

## 2020-03-16 MED ORDER — ROCURONIUM 10MG/ML (10ML) SYRINGE FOR MEDFUSION PUMP - OPTIME
INTRAVENOUS | Status: DC | PRN
  Administered 2020-03-16 (×2): 20 mg via INTRAVENOUS
  Administered 2020-03-16: 40 mg via INTRAVENOUS

## 2020-03-16 MED ORDER — HEMOSTATIC AGENTS (NO CHARGE) OPTIME
TOPICAL | Status: DC | PRN
  Administered 2020-03-16: 1 via TOPICAL

## 2020-03-16 MED ORDER — PROMETHAZINE HCL 25 MG/ML IJ SOLN: 6.2500 mg | INTRAMUSCULAR | Status: DC | PRN

## 2020-03-16 SURGICAL SUPPLY — 54 items
ADH SKN CLS APL DERMABOND .7 (GAUZE/BANDAGES/DRESSINGS) ×1
APL SRG 38 LTWT LNG FL B (MISCELLANEOUS) ×1
APPLICATOR ARISTA FLEXITIP XL (MISCELLANEOUS) ×3 IMPLANT
APPLIER CLIP 5 13 M/L LIGAMAX5 (MISCELLANEOUS)
APR CLP MED LRG 5 ANG JAW (MISCELLANEOUS)
BAG HAMPER (MISCELLANEOUS) ×3 IMPLANT
BAG RETRIEVAL 10 (BASKET) ×1
BAG RETRIEVAL 10MM (BASKET) ×1
BLADE SURG SZ11 CARB STEEL (BLADE) ×3 IMPLANT
CATH SILICONE 22FR 30CC 3WAY (CATHETERS) ×3 IMPLANT
CLIP APPLIE 5 13 M/L LIGAMAX5 (MISCELLANEOUS) IMPLANT
CLOTH BEACON ORANGE TIMEOUT ST (SAFETY) ×3 IMPLANT
COVER LIGHT HANDLE STERIS (MISCELLANEOUS) ×6 IMPLANT
COVER WAND RF STERILE (DRAPES) ×3 IMPLANT
DERMABOND ADVANCED (GAUZE/BANDAGES/DRESSINGS) ×2
DERMABOND ADVANCED .7 DNX12 (GAUZE/BANDAGES/DRESSINGS) ×1 IMPLANT
ELECT REM PT RETURN 9FT ADLT (ELECTROSURGICAL) ×3
ELECTRODE REM PT RTRN 9FT ADLT (ELECTROSURGICAL) ×1 IMPLANT
GAUZE 4X4 16PLY RFD (DISPOSABLE) ×3 IMPLANT
GLOVE BIOGEL M 6.5 STRL (GLOVE) ×3 IMPLANT
GLOVE BIOGEL M 7.0 STRL (GLOVE) ×3 IMPLANT
GLOVE BIOGEL M STRL SZ7.5 (GLOVE) ×3 IMPLANT
GLOVE BIOGEL PI IND STRL 7.0 (GLOVE) ×4 IMPLANT
GLOVE BIOGEL PI IND STRL 7.5 (GLOVE) ×1 IMPLANT
GLOVE BIOGEL PI IND STRL 8 (GLOVE) ×1 IMPLANT
GLOVE BIOGEL PI INDICATOR 7.0 (GLOVE) ×8
GLOVE BIOGEL PI INDICATOR 7.5 (GLOVE) ×2
GLOVE BIOGEL PI INDICATOR 8 (GLOVE) ×2
GLOVE ECLIPSE 8.0 STRL XLNG CF (GLOVE) ×3 IMPLANT
GOWN STRL REUS W/TWL LRG LVL3 (GOWN DISPOSABLE) ×3 IMPLANT
GOWN STRL REUS W/TWL XL LVL3 (GOWN DISPOSABLE) ×3 IMPLANT
HEMOSTAT ARISTA ABSORB 3G PWDR (HEMOSTASIS) ×3 IMPLANT
INST SET LAPROSCOPIC GYN AP (KITS) ×3 IMPLANT
KIT TURNOVER CYSTO (KITS) ×3 IMPLANT
NEEDLE HYPO 18GX1.5 BLUNT FILL (NEEDLE) ×3 IMPLANT
NEEDLE HYPO 21X1.5 SAFETY (NEEDLE) ×3 IMPLANT
NEEDLE INSUFFLATION 14GA 120MM (NEEDLE) ×3 IMPLANT
PACK PERI GYN (CUSTOM PROCEDURE TRAY) ×3 IMPLANT
PAD ARMBOARD 7.5X6 YLW CONV (MISCELLANEOUS) ×3 IMPLANT
SET BASIN LINEN APH (SET/KITS/TRAYS/PACK) ×3 IMPLANT
SET TUBE SMOKE EVAC HIGH FLOW (TUBING) ×3 IMPLANT
SHEARS HARMONIC ACE PLUS 36CM (ENDOMECHANICALS) ×3 IMPLANT
SLEEVE ENDOPATH XCEL 5M (ENDOMECHANICALS) ×3 IMPLANT
SOL ANTI FOG 6CC (MISCELLANEOUS) ×1 IMPLANT
SOLUTION ANTI FOG 6CC (MISCELLANEOUS) ×2
SUT VICRYL 0 UR6 27IN ABS (SUTURE) ×3 IMPLANT
SUT VICRYL AB 3-0 FS1 BRD 27IN (SUTURE) ×6 IMPLANT
SYR 10ML LL (SYRINGE) ×3 IMPLANT
SYR 20ML LL LF (SYRINGE) ×6 IMPLANT
SYS BAG RETRIEVAL 10MM (BASKET) ×1
SYSTEM BAG RETRIEVAL 10MM (BASKET) ×1 IMPLANT
TROCAR ENDO BLADELESS 11MM (ENDOMECHANICALS) ×3 IMPLANT
TROCAR XCEL NON-BLD 5MMX100MML (ENDOMECHANICALS) ×3 IMPLANT
WARMER LAPAROSCOPE (MISCELLANEOUS) ×3 IMPLANT

## 2020-03-16 NOTE — Discharge Instructions (Signed)
[facility] THE [facility] [place] UNTIL Sunday March 20, 2020. DO NOT USE ANY ADDITIONAL NUMBING MEDICATIONS WITHOUT CONSULTING A PHYSICIAN     Laparoscopic Tubal Ligation, Care After This sheet gives you information about how to care for yourself after your procedure. Your health care provider may also give you more specific instructions. If you have problems or questions, contact your health care provider. What can I expect after the procedure? After the procedure, it is common to have:  A sore throat.  Discomfort in your shoulder.  Mild discomfort or cramping in your abdomen.  Gas pains.  Pain or soreness in the area where the surgical incision was made.  A bloated feeling.  Tiredness.  Nausea.  Vomiting. Follow these instructions at home: Medicines  Take over-the-counter and prescription medicines only as told by your health care provider.  Do not take aspirin because it can cause bleeding.  Ask your health care provider if the medicine prescribed to you: ? Requires you to avoid driving or using heavy machinery. ? Can cause constipation. You may need to take actions to prevent or treat constipation, such as:  Drink enough fluid to keep your urine pale yellow.  Take over-the-counter or prescription medicines.  Eat foods that are high in fiber, such as beans, whole grains, and fresh fruits and vegetables.  Limit foods that are high in fat and processed sugars, such as fried or sweet foods. Incision care      Follow instructions from your health care provider about how to take care of your incision. Make sure you: ? Wash your hands with soap and water before and after you change your bandage (dressing). If soap and water are not available, use hand sanitizer. ? Change your dressing as told by your health care provider. ? Leave stitches (sutures), skin glue, or adhesive strips in place. These skin closures may need to stay in place for 2 weeks or  longer. If adhesive strip edges start to loosen and curl up, you may trim the loose edges. Do not remove adhesive strips completely unless your health care provider tells you to do that.  Check your incision area every day for signs of infection. Check for: ? Redness, swelling, or pain. ? Fluid or blood. ? Warmth. ? Pus or a bad smell. Activity  Rest as told by your health care provider.  Avoid sitting for a long time without moving. Get up to take short walks every 1-2 hours. This is important to improve blood flow and breathing. Ask for help if you feel weak or unsteady.  Return to your normal activities as told by your health care provider. Ask your health care provider what activities are safe for you. General instructions  Do not take baths, swim, or use a hot tub until your health care provider approves. Ask your health care provider if you may take showers. You may only be allowed to take sponge baths.  Have someone help you with your daily household tasks for the first few days.  Keep all follow-up visits as told by your health care provider. This is important. Contact a health care provider if:  You have redness, swelling, or pain around your incision.  Your incision feels warm to the touch.  You have pus or a bad smell coming from your incision.  The edges of your incision break open after the sutures have been removed.  Your pain does not improve after 2-3 days.  You have a rash.  You  repeatedly become dizzy or light-headed.  Your pain medicine is not helping. Get help right away if you:  Have a fever.  Faint.  Have increasing pain in your abdomen.  Have severe pain in one or both of your shoulders.  Have fluid or blood coming from your sutures or from your vagina.  Have shortness of breath or difficulty breathing.  Have chest pain or leg pain.  Have ongoing nausea, vomiting, or diarrhea. Summary  After the procedure, it is common to have mild  discomfort or cramping in your abdomen.  Take over-the-counter and prescription medicines only as told by your health care provider.  Watch for symptoms that should prompt you to call your health care provider.  Keep all follow-up visits as told by your health care provider. This is important. This information is not intended to replace advice given to you by your health care provider. Make sure you discuss any questions you have with your health care provider. Document Revised: 09/23/2018 Document Reviewed: 03/11/2018 Elsevier Patient Education  2020 [place] Anesthesia, Adult, Care After This sheet gives you information about how to care for yourself after your procedure. Your health care provider may also give you more specific instructions. If you have problems or questions, contact your health care provider. What can I expect after the procedure? After the procedure, the following side effects are common:  Pain or discomfort at the IV site.  Nausea.  Vomiting.  Sore throat.  Trouble concentrating.  Feeling cold or chills.  Weak or tired.  Sleepiness and fatigue.  Soreness and body aches. These side effects can affect parts of the body that were not involved in surgery. Follow these instructions at home:  For at least 24 hours after the procedure:  Have a responsible adult stay with you. It is important to have someone help care for you until you are awake and alert.  Rest as needed.  Do not: ? Participate in activities in which you could fall or become injured. ? Drive. ? Use heavy machinery. ? Drink alcohol. ? Take sleeping pills or medicines that cause drowsiness. ? Make important decisions or sign legal documents. ? Take care of children on your own. Eating and drinking  Follow any instructions from your health care provider about eating or drinking restrictions.  When you feel hungry, start by eating small amounts of foods that are  soft and easy to digest (bland), such as toast. Gradually return to your regular diet.  Drink enough fluid to keep your urine pale yellow.  If you vomit, rehydrate by drinking water, juice, or clear broth. General instructions  If you have sleep apnea, surgery and certain medicines can increase your risk for breathing problems. Follow instructions from your health care provider about wearing your sleep device: ? Anytime you are sleeping, including during daytime naps. ? While taking prescription pain medicines, sleeping medicines, or medicines that make you drowsy.  Return to your normal activities as told by your health care provider. Ask your health care provider what activities are safe for you.  Take over-the-counter and prescription medicines only as told by your health care provider.  If you smoke, do not smoke without supervision.  Keep all follow-up visits as told by your health care provider. This is important. Contact a health care provider if:  You have nausea or vomiting that does not get better with medicine.  You cannot eat or drink without vomiting.  You have pain  that does not get better with medicine.  You are unable to pass urine.  You develop a skin rash.  You have a fever.  You have redness around your IV site that gets worse. Get help right away if:  You have difficulty breathing.  You have chest pain.  You have blood in your urine or stool, or you vomit blood. Summary  After the procedure, it is common to have a sore throat or nausea. It is also common to feel tired.  Have a responsible adult stay with you for the first 24 hours after general anesthesia. It is important to have someone help care for you until you are awake and alert.  When you feel hungry, start by eating small amounts of foods that are soft and easy to digest (bland), such as toast. Gradually return to your regular diet.  Drink enough fluid to keep your urine pale  yellow.  Return to your normal activities as told by your health care provider. Ask your health care provider what activities are safe for you. This information is not intended to replace advice given to you by your health care provider. Make sure you discuss any questions you have with your health care provider. Document Revised: 04/19/2017 Document Reviewed: 11/30/2016 Elsevier Patient Education  [place].

## 2020-03-16 NOTE — Op Note (Signed)
Preoperative Diagnosis:  1.  Multiparous female desires permanent sterilization                                          2.  Elects to have bilateral salpingectomy for ovarian cancer prophylaxis  Postoperative Diagnosis:  Same as above  Procedure:  Laparoscopic Bilateral Salpingectomy for the purpose of permanent sterilization  Surgeon:  Jacelyn Grip MD  Anaesthesia: general  Findings:  Patient had normal pelvic anatomy and no intraperitoneal abnormalities.  Description of Operation:  Patient was taken to the OR and placed into supine position where she underwent general anaesthesia.   She was placed in the dorsal lithotomy position and prepped and draped in the usual sterile fashion.   An incision was made in the umbilicus and dissection taken down to the rectus fascia. A Veres needle was used to insufflate the periotneal cavity. An 11 mm non bladed video laparoscope trocar was then placed under direct visualization without difficulty.   The above noted findings were observed.   Two additional 5 mm non bladed trocars were placed in the right and left lower quadrants under direct visualization without difficulty.   The Harmonic scalpel was employed and a salpingectomy of both the right and left tubes was performed.   The tubes were removed from the peritoneal cavity and sent to pathology.   There was good hemostasis bilaterally.   The fascia, peritoneum and subcutaneous tissue were closed using 0 vicryl.   All 3 skin incisions were closed using 3-0 vicryl in a subcuticular fashion.  Exparel 266 mg 20 cc was injected in the 3 incisional/trocar sites.  The patient was awakened from anaesthesia and taken to the PACU with all counts being correct x 3.   The patient received  2 gram of ancef andToradol 30 mg IV preoperatively.  Florian Buff 03/16/2020 4:11 PM

## 2020-03-16 NOTE — Anesthesia Preprocedure Evaluation (Signed)
Anesthesia Evaluation  Patient identified by MRN, date of birth, ID band Patient awake    Reviewed: Allergy & Precautions, NPO status , Patient's Chart, lab work & pertinent test results  History of Anesthesia Complications (+) history of anesthetic complications  Airway Mallampati: II  TM Distance: >3 FB Neck ROM: Full    Dental  (+) Dental Advisory Given, Teeth Intact   Pulmonary asthma , Recent URI , Resolved,    Pulmonary exam normal breath sounds clear to auscultation       Cardiovascular Exercise Tolerance: Good hypertension, Pt. on medications  Rhythm:Regular Rate:Normal     Neuro/Psych  Headaches, Anxiety  Neuromuscular disease    GI/Hepatic Neg liver ROS, GERD  Medicated and Controlled,  Endo/Other  negative endocrine ROS  Renal/GU negative Renal ROS     Musculoskeletal negative musculoskeletal ROS (+)   Abdominal   Peds  Hematology  (+) anemia ,   Anesthesia Other Findings   Reproductive/Obstetrics negative OB ROS                             Anesthesia Physical Anesthesia Plan  ASA: III  Anesthesia Plan: General   Post-op Pain Management:    Induction: Intravenous  PONV Risk Score and Plan: 3 and Midazolam, Dexamethasone and Ondansetron  Airway Management Planned: Oral ETT  Additional Equipment:   Intra-op Plan:   Post-operative Plan: Extubation in OR  Informed Consent: I have reviewed the patients History and Physical, chart, labs and discussed the procedure including the risks, benefits and alternatives for the proposed anesthesia with the patient or authorized representative who has indicated his/her understanding and acceptance.     Dental advisory given  Plan Discussed with: CRNA and Surgeon  Anesthesia Plan Comments:         Anesthesia Quick Evaluation

## 2020-03-16 NOTE — Transfer of Care (Signed)
Immediate Anesthesia Transfer of Care Note  Patient: [name]  Procedure(s) Performed: LAPAROSCOPIC BILATERAL SALPINGECTOMY (Bilateral )  Patient Location: PACU  Anesthesia Type:General  Level of Consciousness: awake, alert , oriented and patient cooperative  Airway & Oxygen Therapy: Patient Spontanous Breathing  Post-op Assessment: Report given to RN, Post -op Vital signs reviewed and stable and Patient moving all extremities  Post vital signs: Reviewed and stable  Last Vitals:  Vitals Value Taken Time  BP    Temp    Pulse    Resp    SpO2      Last Pain:  Vitals:   03/16/20 1308  TempSrc: Oral  PainSc: 0-No pain      Patients Stated Pain Goal: 8 (15/05[birth year]69 7948)  Complications: No complications documented.

## 2020-03-16 NOTE — H&P (Signed)
Preoperative History and Physical  [name] is a 39 y.o. [id number] with Patient's last menstrual period was 01/24/2020. admitted for a laparoscopic bilateral salpingectomy for .permanent sterilization Opts for salpingectomy  PMH:        Past Medical History:  Diagnosis Date  . Allergy    banana/pineapple  . Anemia    post pregnancy  . Anxiety   . Asthma   . Autoimmune disease, not elsewhere classified(279.49) 2013   Non specific- [facility], abnormal Bone Marrow signal  . Breast lump in female 11/28/2012   Has tender round mass at 10-11 0'clock right breast 4 finger breaths from areola will [place]  . Breast mass, right 08/18/2012  . Chlamydia   . Chronic breast pain 05/31/2015  . Dysmenorrhea 03/23/2014  . Dyspareunia 01/05/2014  . Fibroids, intramural 08/24/2015  . GERD (gastroesophageal reflux disease)   . Gonorrhea   . Headache(784.0)   . History of abnormal cervical Pap smear 04/02/2014  . Hypertension   . LLQ abdominal tenderness 05/31/2015  . Menorrhagia 01/05/2014  . Neuromuscular disorder ([place])    lower back and bilat leg pain  . Obesity   . Other and unspecified ovarian cyst 01/12/2014  . Polyclonal gammopathy 07/26/2013   Insignificant  . Recurrent upper respiratory infection (URI)   . Scoliosis   . Urticaria   . Vaginal irritation 09/30/2014  . Vaginal Pap smear, abnormal     PSH:          Past Surgical History:  Procedure Laterality Date  . ADENOIDECTOMY    . DILATION AND CURETTAGE OF UTERUS    . SHOULDER SURGERY Right 03/10/2019  . TONSILLECTOMY      POb/GynH:              OB History    Gravida  2   Para  2   Term  2   Preterm      AB  0   Living  2     SAB  0   TAB      Ectopic      Multiple  0   Live Births  2           SH:   Social History       Tobacco Use  . Smoking status: Never Smoker  . Smokeless tobacco: Never Used  Vaping Use  . Vaping Use: Never used   Substance Use Topics  . Alcohol use: No  . Drug use: No    FH:         Family History  Problem Relation Age of Onset  . Hypertension Mother   . Diabetes Mother   . Heart disease Mother   . Diabetes Father   . Cancer Paternal Aunt        breast  . Hypertension Maternal Grandmother   . Diabetes Son        pre diabetic  . Other Son        overactive bladder  . COPD Maternal Aunt   . Leukemia Maternal Aunt   . Allergic rhinitis Neg Hx   . Angioedema Neg Hx   . Asthma Neg Hx   . Atopy Neg Hx   . Eczema Neg Hx   . Immunodeficiency Neg Hx   . Urticaria Neg Hx      Allergies:      Allergies  Allergen Reactions  . Banana Rash  . Pineapple Rash    Medications:  Current Outpatient Medications:  .  albuterol (PROVENTIL HFA) 108 (90 Base) MCG/ACT inhaler, Use 4 puffs every 4-6 hours as needed for cough or wheeze., Disp: 18 g, Rfl: 2 .  amLODipine (NORVASC) 10 MG tablet, Take 1 tablet (10 mg total) by mouth daily., Disp: 30 tablet, Rfl: 3 .  Blood Pressure Monitoring (BLOOD PRESSURE CUFF) MISC, 1 Device by Does not apply route once a week. To check pressure 3-5 times weekly (Patient taking differently: 1 Device by Does not apply route See admin instructions. To check pressure 3-5 times weekly), Disp: 1 each, Rfl: 0 .  flunisolide (NASALIDE) 25 MCG/ACT (0.025%) SOLN, Place 2 sprays into the nose 2 (two) times daily. (Patient taking differently: Place 2 sprays into the nose as needed. ), Disp: 25 mL, Rfl: 5 .  hydrochlorothiazide (HYDRODIURIL) 25 MG tablet, Take 1 tablet (25 mg total) by mouth daily., Disp: 30 tablet, Rfl: 0 .  Prenatal Vit-DSS-Fe Cbn-FA (PRENATAL AD PO), Take 1 tablet by mouth daily. , Disp: , Rfl:  .  senna-docusate (SENOKOT-S) 8.6-50 MG tablet, Take 2 tablets by mouth at bedtime as needed for mild constipation., Disp: 20 tablet, Rfl: 1  Review of Systems:   Review of Systems  Constitutional: Negative for fever, chills,  weight loss, malaise/fatigue and diaphoresis.  HENT: Negative for hearing loss, ear pain, nosebleeds, congestion, sore throat, neck pain, tinnitus and ear discharge.   Eyes: Negative for blurred vision, double vision, photophobia, pain, discharge and redness.  Respiratory: Negative for cough, hemoptysis, sputum production, shortness of breath, wheezing and stridor.   Cardiovascular: Negative for chest pain, palpitations, orthopnea, claudication, leg swelling and PND.  Gastrointestinal: Positive for abdominal pain. Negative for heartburn, nausea, vomiting, diarrhea, constipation, blood in stool and melena.  Genitourinary: Negative for dysuria, urgency, frequency, hematuria and flank pain.  Musculoskeletal: Negative for myalgias, back pain, joint pain and falls.  Skin: Negative for itching and rash.  Neurological: Negative for dizziness, tingling, tremors, sensory change, speech change, focal weakness, seizures, loss of consciousness, weakness and headaches.  Endo/Heme/Allergies: Negative for environmental allergies and polydipsia. Does not bruise/bleed easily.  Psychiatric/Behavioral: Negative for depression, suicidal ideas, hallucinations, memory loss and substance abuse. The patient is not nervous/anxious and does not have insomnia.      PHYSICAL EXAM:  Blood pressure (!) 130/91, pulse 86, height 5\' 5"  (1.651 m), weight 283 lb (128.4 kg), last menstrual period 01/24/2020, currently breastfeeding.    Vitals reviewed. Constitutional: She is oriented to person, place, and time. She appears well-developed and well-nourished.  HENT:  Head: Normocephalic and atraumatic.  Right Ear: External ear normal.  Left Ear: External ear normal.  Nose: Nose normal.  Mouth/Throat: Oropharynx is clear and moist.  Eyes: Conjunctivae and EOM are normal. Pupils are equal, round, and reactive to light. Right eye exhibits no discharge. Left eye exhibits no discharge. No scleral icterus.  Neck: Normal range  of motion. Neck supple. No tracheal deviation present. No thyromegaly present.  Cardiovascular: Normal rate, regular rhythm, normal heart sounds and intact distal pulses.  Exam reveals no gallop and no friction rub.   No murmur heard. Respiratory: Effort normal and breath sounds normal. No respiratory distress. She has no wheezes. She has no rales. She exhibits no tenderness.  GI: Soft. Bowel sounds are normal. She exhibits no distension and no mass. There is tenderness. There is no rebound and no guarding.  Genitourinary:       Vulva is normal without lesions Vagina is pink moist without discharge Cervix normal  in appearance and pap is normal Uterus is normal size, contour, position, consistency, mobility, non-tender Adnexa is negative with normal sized ovaries by sonogram  Musculoskeletal: Normal range of motion. She exhibits no edema and no tenderness.  Neurological: She is alert and oriented to person, place, and time. She has normal reflexes. She displays normal reflexes. No cranial nerve deficit. She exhibits normal muscle tone. Coordination normal.  Skin: Skin is warm and dry. No rash noted. No erythema. No pallor.  Psychiatric: She has a normal mood and affect. Her behavior is normal. Judgment and thought content normal.    Labs: Results for orders placed or performed during the hospital encounter of 03/15/20 (from the past 72 hour(s))  SARS CORONAVIRUS 2 (TAT 6-24 HRS) Nasopharyngeal Nasopharyngeal Swab     Status: None   Collection Time: 03/15/20  8:57 AM   Specimen: Nasopharyngeal Swab  Result Value Ref Range   SARS Coronavirus 2 NEGATIVE NEGATIVE    Comment: (NOTE) SARS-CoV-2 target nucleic acids are NOT DETECTED.  The SARS-CoV-2 RNA is generally detectable in upper and lower respiratory specimens during the acute phase of infection. Negative results do not preclude SARS-CoV-2 infection, do not rule out co-infections with other pathogens, and should not be used as  the sole basis for treatment or other patient management decisions. Negative results must be combined with clinical observations, patient history, and epidemiological information. The expected result is Negative.  Fact Sheet for Patients: SugarRoll.be  Fact Sheet for Healthcare Providers: https://www.woods-mathews.com/  This test is not yet approved or cleared by the [place] FDA and  has been authorized for detection and/or diagnosis of SARS-CoV-2 by FDA under an Emergency Use Authorization (EUA). This EUA will remain  in effect (meaning this test can be used) for the duration of the COVID-19 declaration under Se ction 564(b)(1) of the Act, 21 U.S.C. section 360bbb-3(b)(1), unless the authorization is terminated or revoked sooner.  Performed at [facility], [place] [address] [place], [place] [address]   CBC     Status: Abnormal   Collection Time: 03/15/20  8:57 AM  Result Value Ref Range   WBC 7.9 4.0 - 10.5 K/uL   RBC 4.04 3.87 - 5.11 MIL/uL   Hemoglobin 11.6 (L) 12.0 - 15.0 g/dL   HCT 35.7 (L) 36 - 46 %   MCV 88.4 80.0 - 100.0 fL   MCH 28.7 26.0 - 34.0 pg   MCHC 32.5 30.0 - 36.0 g/dL   RDW 13.0 11.5 - 15.5 %   Platelets 371 150 - 400 K/uL   nRBC 0.0 0.0 - 0.2 %    Comment: Performed at [facility], [address], [place], [place] [address]  Comprehensive metabolic panel     Status: Abnormal   Collection Time: 03/15/20  8:57 AM  Result Value Ref Range   Sodium 138 135 - 145 mmol/L   Potassium 3.4 (L) 3.5 - 5.1 mmol/L   Chloride 103 98 - 111 mmol/L   CO2 27 22 - 32 mmol/L   Glucose, Bld 105 (H) 70 - 99 mg/dL    Comment: Glucose reference range applies only to samples taken after fasting for at least 8 hours.   BUN 6 6 - 20 mg/dL   Creatinine, Ser 0.76 0.44 - 1.00 mg/dL   Calcium 8.7 (L) 8.9 - 10.3 mg/dL   Total Protein 7.1 6.5 - 8.1 g/dL   Albumin 3.6 3.5 - 5.0 g/dL   AST 12 (L) 15 - 41 U/L  ALT 11 0 - 44 U/L    Alkaline Phosphatase 82 38 - 126 U/L   Total Bilirubin 0.7 0.3 - 1.2 mg/dL   GFR, Estimated >60 >60 mL/min    Comment: (NOTE) Calculated using the CKD-EPI Creatinine Equation (2021)    Anion gap 8 5 - 15    Comment: Performed at [facility], [address] [place], [place] [address]  hCG, quantitative, pregnancy     Status: None   Collection Time: 03/15/20  8:57 AM  Result Value Ref Range   hCG, Beta Chain, Quant, S <1 <5 mIU/mL    Comment:          GEST. AGE      CONC.  (mIU/mL)   <=1 WEEK        5 - 50     2 WEEKS       50 - 500     3 WEEKS       100 - 10,000     4 WEEKS     1,000 - 30,000     5 WEEKS     3,500 - 115,000   6-8 WEEKS     12,000 - 270,000    12 WEEKS     15,000 - 220,000        FEMALE AND NON-PREGNANT FEMALE:     LESS THAN 5 mIU/mL Performed at [facility], [address] [place], [place] [address]   Hemoglobin A1c     Status: None   Collection Time: 03/15/20  9:07 AM  Result Value Ref Range   Hgb A1c MFr Bld 4.8 4.8 - 5.6 %    Comment: (NOTE) Pre diabetes:          5.7%-6.4%  Diabetes:              >6.4%  Glycemic control for   <7.0% adults with diabetes    Mean Plasma Glucose 91.06 mg/dL    Comment: Performed at [facility] [address] [place] [address], [place], [place] [address]  Urinalysis, Routine w reflex microscopic Urine, Clean Catch     Status: Abnormal   Collection Time: 03/15/20  9:31 AM  Result Value Ref Range   Color, Urine YELLOW YELLOW   APPearance HAZY (A) CLEAR   Specific Gravity, Urine 1.016 1.005 - 1.030   pH 8.0 5.0 - 8.0   Glucose, UA NEGATIVE NEGATIVE mg/dL   Hgb urine dipstick NEGATIVE NEGATIVE   Bilirubin Urine NEGATIVE NEGATIVE   Ketones, ur NEGATIVE NEGATIVE mg/dL   Protein, ur 30 (A) NEGATIVE mg/dL   Nitrite NEGATIVE NEGATIVE   Leukocytes,Ua LARGE (A) NEGATIVE   RBC / HPF 6-10 0 - 5 RBC/hpf   WBC, UA 21-50 0 - 5 WBC/hpf   Bacteria, UA FEW (A) NONE SEEN   Squamous Epithelial / LPF 6-10 0 - 5   Mucus PRESENT    Budding  Yeast PRESENT    Hyaline Casts, UA PRESENT     Comment: Performed at [facility], [address], [place], [place] [address]    EKG:   Imaging Studies: Imaging Results  No results found.      Assessment: Multiparous female desires permanent sterilization  Plan: Laparoscopic bilateral salpingectomy for permanent sterilization, 03/16/20 tentatively  Florian Buff, MD 03/16/2020 2:32 PM

## 2020-03-16 NOTE — Anesthesia Postprocedure Evaluation (Signed)
Anesthesia Post Note  Patient: [name]  Procedure(s) Performed: LAPAROSCOPIC BILATERAL SALPINGECTOMY (Bilateral )  Patient location during evaluation: PACU Anesthesia Type: General Level of consciousness: awake, oriented, awake and alert and patient cooperative Pain management: pain level controlled Vital Signs Assessment: post-procedure vital signs reviewed and stable Respiratory status: spontaneous breathing, respiratory function stable and nonlabored ventilation Cardiovascular status: blood pressure returned to baseline and stable Postop Assessment: no headache and no backache Anesthetic complications: no   No complications documented.   Last Vitals:  Vitals:   03/16/20 1308  BP: (!) 152/101  Resp: 18  Temp: 36.9 C  SpO2: 98%    Last Pain:  Vitals:   03/16/20 1308  TempSrc: Oral  PainSc: 0-No pain                 Tacy Learn

## 2020-03-16 NOTE — Anesthesia Procedure Notes (Addendum)
Procedure Name: Intubation Date/Time: 03/16/2020 2:57 PM Performed by: Ollen Bowl, CRNA Pre-anesthesia Checklist: Patient identified, Patient being monitored, Timeout performed, Emergency Drugs available and Suction available Patient Re-evaluated:Patient Re-evaluated prior to induction Oxygen Delivery Method: Circle System Utilized Preoxygenation: Pre-oxygenation with 100% oxygen Induction Type: IV induction Ventilation: Mask ventilation without difficulty Laryngoscope Size: Mac and 3 Grade View: Grade I Tube type: Oral Tube size: 7.0 mm Number of attempts: 1 Airway Equipment and Method: Stylet Placement Confirmation: ETT inserted through vocal cords under direct vision,  positive ETCO2 and breath sounds checked- equal and bilateral Secured at: 22 cm Tube secured with: Tape Dental Injury: Teeth and Oropharynx as per pre-operative assessment

## 2020-03-17 ENCOUNTER — Encounter (HOSPITAL_COMMUNITY): Payer: Self-pay | Admitting: Obstetrics & Gynecology

## 2020-03-17 MED ORDER — CEFAZOLIN SODIUM-DEXTROSE 2-4 GM/100ML-% IV SOLN
INTRAVENOUS | Status: AC
  Filled 2020-03-17: qty 100

## 2020-03-17 MED ORDER — KETOROLAC TROMETHAMINE 30 MG/ML IJ SOLN
INTRAMUSCULAR | Status: AC
  Filled 2020-03-17: qty 1

## 2020-03-17 MED ORDER — CHLORHEXIDINE GLUCONATE 0.12 % MT SOLN
OROMUCOSAL | Status: AC
  Filled 2020-03-17: qty 15

## 2020-03-18 LAB — SURGICAL PATHOLOGY

## 2020-03-28 ENCOUNTER — Telehealth (INDEPENDENT_AMBULATORY_CARE_PROVIDER_SITE_OTHER): Payer: Medicaid Other | Admitting: Obstetrics & Gynecology

## 2020-03-28 ENCOUNTER — Encounter: Payer: Self-pay | Admitting: Obstetrics & Gynecology

## 2020-03-28 VITALS — BP 128/90 | HR 81 | Wt 278.0 lb

## 2020-03-28 DIAGNOSIS — Z9889 Other specified postprocedural states: Secondary | ICD-10-CM

## 2020-03-28 MED ORDER — NORETHINDRONE 0.35 MG PO TABS: 1.0000 | ORAL_TABLET | Freq: Every day | ORAL | 11 refills | Status: DC

## 2020-03-28 NOTE — Progress Notes (Addendum)
Video vist, pt is at home, I am in office HPI: Patient returns for routine postoperative follow-up having undergone laparoscopic bilateral salpingectomy  on 03/16/20.  The patient's immediate postoperative recovery has been unremarkable. Since hospital discharge the patient reports no problems.   Current Outpatient Medications: acetaminophen (TYLENOL) 325 MG tablet, Take 650 mg by mouth every 6 (six) hours as needed for moderate pain., Disp: , Rfl:  albuterol (PROVENTIL HFA) 108 (90 Base) MCG/ACT inhaler, Use 4 puffs every 4-6 hours as needed for cough or wheeze. (Patient taking differently: Inhale 4 puffs into the lungs every 6 (six) hours as needed for wheezing (cough). ), Disp: 18 g, Rfl: 2 amLODipine (NORVASC) 10 MG tablet, Take 1 tablet (10 mg total) by mouth daily., Disp: 30 tablet, Rfl: 3 Blood Pressure Monitoring (BLOOD PRESSURE CUFF) MISC, 1 Device by Does not apply route once a week. To check pressure 3-5 times weekly (Patient taking differently: 1 Device by Does not apply route See admin instructions. To check pressure 3-5 times weekly), Disp: 1 each, Rfl: 0 flunisolide (NASALIDE) 25 MCG/ACT (0.025%) SOLN, Place 2 sprays into the nose 2 (two) times daily. (Patient taking differently: Place 2 sprays into the nose daily as needed (allergies). ), Disp: 25 mL, Rfl: 5 Prenatal Vit-DSS-Fe Cbn-FA (PRENATAL AD PO), Take 1 tablet by mouth daily. , Disp: , Rfl:  senna-docusate (SENOKOT-S) 8.6-50 MG tablet, Take 2 tablets by mouth at bedtime as needed for mild constipation., Disp: 20 tablet, Rfl: 1 HYDROcodone-acetaminophen (NORCO/VICODIN) 5-325 MG tablet, Take 1 tablet by mouth every 6 (six) hours as needed. (Patient not taking: Reported on 03/28/2020), Disp: 15 tablet, Rfl: 0 ketorolac (TORADOL) 10 MG tablet, Take 1 tablet (10 mg total) by mouth every 8 (eight) hours as needed. (Patient not taking: Reported on 03/28/2020), Disp: 15 tablet, Rfl: 0 norethindrone (MICRONOR) 0.35 MG tablet, Take 1  tablet (0.35 mg total) by mouth daily. Take 1 a day, Disp: 28 tablet, Rfl: 11 ondansetron (ZOFRAN ODT) 8 MG disintegrating tablet, Take 1 tablet (8 mg total) by mouth every 8 (eight) hours as needed for nausea or vomiting. (Patient not taking: Reported on 03/28/2020), Disp: 8 tablet, Rfl: 0  No current facility-administered medications for this visit.    Blood pressure 128/90, pulse 81, weight 278 lb (126.1 kg), last menstrual period 03/25/2020, currently breastfeeding.  Physical Exam: Pt reports inicisons are healing well  Diagnostic Tests:   Pathology: benign  Impression: S/p laparoscopic bilateral salpingectomy  Plan: Meds ordered this encounter  Medications  . norethindrone (MICRONOR) 0.35 MG tablet    Sig: Take 1 tablet (0.35 mg total) by mouth daily. Take 1 a day    Dispense:  28 tablet    Refill:  11     Follow up: prn    Florian Buff, MD

## 2020-03-30 ENCOUNTER — Ambulatory Visit (HOSPITAL_COMMUNITY)
Admission: RE | Admit: 2020-03-30 | Discharge: 2020-03-30 | Disposition: A | Discharge status: Home / Self Care | Payer: Medicaid Other | Source: Ambulatory Visit | Attending: Nephrology | Admitting: Nephrology

## 2020-03-30 ENCOUNTER — Other Ambulatory Visit: Payer: Self-pay

## 2020-03-30 DIAGNOSIS — N170 Acute kidney failure with tubular necrosis: Secondary | ICD-10-CM | POA: Diagnosis not present

## 2020-03-30 DIAGNOSIS — N1330 Unspecified hydronephrosis: Secondary | ICD-10-CM | POA: Diagnosis not present

## 2020-04-07 DIAGNOSIS — G4733 Obstructive sleep apnea (adult) (pediatric): Secondary | ICD-10-CM | POA: Diagnosis not present

## 2020-04-07 DIAGNOSIS — E876 Hypokalemia: Secondary | ICD-10-CM | POA: Diagnosis not present

## 2020-04-07 DIAGNOSIS — I129 Hypertensive chronic kidney disease with stage 1 through stage 4 chronic kidney disease, or unspecified chronic kidney disease: Secondary | ICD-10-CM | POA: Diagnosis not present

## 2020-04-07 DIAGNOSIS — Z79899 Other long term (current) drug therapy: Secondary | ICD-10-CM | POA: Diagnosis not present

## 2020-04-07 DIAGNOSIS — R809 Proteinuria, unspecified: Secondary | ICD-10-CM | POA: Diagnosis not present

## 2020-04-07 DIAGNOSIS — N170 Acute kidney failure with tubular necrosis: Secondary | ICD-10-CM | POA: Diagnosis not present

## 2020-04-07 DIAGNOSIS — E871 Hypo-osmolality and hyponatremia: Secondary | ICD-10-CM | POA: Diagnosis not present

## 2020-04-07 DIAGNOSIS — O119 Pre-existing hypertension with pre-eclampsia, unspecified trimester: Secondary | ICD-10-CM | POA: Diagnosis not present

## 2020-04-08 DIAGNOSIS — N393 Stress incontinence (female) (male): Secondary | ICD-10-CM | POA: Diagnosis not present

## 2020-04-08 DIAGNOSIS — E611 Iron deficiency: Secondary | ICD-10-CM | POA: Diagnosis not present

## 2020-04-08 DIAGNOSIS — E559 Vitamin D deficiency, unspecified: Secondary | ICD-10-CM | POA: Diagnosis not present

## 2020-04-08 DIAGNOSIS — I129 Hypertensive chronic kidney disease with stage 1 through stage 4 chronic kidney disease, or unspecified chronic kidney disease: Secondary | ICD-10-CM | POA: Diagnosis not present

## 2020-04-08 DIAGNOSIS — N181 Chronic kidney disease, stage 1: Secondary | ICD-10-CM | POA: Diagnosis not present

## 2020-04-11 ENCOUNTER — Ambulatory Visit: Payer: Medicaid Other | Admitting: Family Medicine

## 2020-04-18 ENCOUNTER — Ambulatory Visit: Payer: Medicaid Other | Admitting: Family Medicine

## 2020-04-18 ENCOUNTER — Other Ambulatory Visit: Payer: Self-pay

## 2020-04-18 ENCOUNTER — Encounter: Payer: Self-pay | Admitting: Family Medicine

## 2020-04-18 VITALS — BP 118/66 | HR 88 | Temp 98.1°F | Resp 14 | Ht 65.0 in | Wt 290.0 lb

## 2020-04-18 DIAGNOSIS — M25473 Effusion, unspecified ankle: Secondary | ICD-10-CM

## 2020-04-18 DIAGNOSIS — I10 Essential (primary) hypertension: Secondary | ICD-10-CM | POA: Diagnosis not present

## 2020-04-18 DIAGNOSIS — R809 Proteinuria, unspecified: Secondary | ICD-10-CM | POA: Diagnosis not present

## 2020-04-18 DIAGNOSIS — K921 Melena: Secondary | ICD-10-CM | POA: Diagnosis not present

## 2020-04-18 DIAGNOSIS — M25471 Effusion, right ankle: Secondary | ICD-10-CM | POA: Diagnosis not present

## 2020-04-18 DIAGNOSIS — H43393 Other vitreous opacities, bilateral: Secondary | ICD-10-CM | POA: Diagnosis not present

## 2020-04-18 NOTE — Progress Notes (Signed)
Subjective:    Patient ID: [name], female    DOB: 09-16-1980, 39 y.o.   MRN: [id number]  Patient presents for Edema (R foot ankle) and Blood in stool (Bright red blood in stool, changing to more rust colored blood on day 3- has resolved)   Pt here with right ankle and foot swelling for 2 months.     She has been following with Nephrology for urines and renal insuffienecy   US showed resolotion of hydronephrosos  she is avoiding NSAIDS  She is on Norvasc for HTN, has history of pre-eclammpsia, which is when protein was picked up, her daughter is now 57 months old   She also noted some blood in her stool 3 days last week, she had had bright red after bowel movement, no straining, no known hemoroids, the 3rd day was old blood  she did have some small clots  no abd pain with it   She is on Micronor   She is on vitamain d for vitamin D def  and iron tablet once a day  By nephrology       Review Of Systems:  GEN- denies fatigue, fever, weight loss,weakness, recent illness HEENT- denies eye drainage, change in vision, nasal discharge, CVS- denies chest pain, palpitations RESP- denies SOB, cough, wheeze ABD- denies N/V, change in stools, abd pain GU- denies dysuria, hematuria, dribbling, incontinence MSK- denies joint pain, muscle aches, injury Neuro- denies headache, dizziness, syncope, seizure activity       Objective:    BP 118/66    Pulse 88    Temp 98.1 F (36.7 C) (Temporal)    Resp 14    Ht 5\' 5"  (1.651 m)    Wt 290 lb (131.5 kg)    LMP 03/25/2020 (Exact Date)    SpO2 100%    BMI 48.26 kg/m  GEN- NAD, alert and oriented x3 HEENT- PERRL, EOMI, non injected sclera, pink conjunctiva, MMM, oropharynx clear Neck- Supple, no thyromegaly , no JVD  CVS- RRR, no murmur RESP-CTAB ABD-NABS,soft,NT,ND EXT- trace bilat ankle edema Pulses- Radial, DP- 2+        Assessment & Plan:      Problem List Items Addressed This Visit      Unprioritized    Hypertension    Blood pressure is controlled.  Her ankle edema may be related to the amlodipine however could also be residual from her preeclampsia and the proteinuria she was experiencing.  Will defer to nephrology before starting any type of diuretic.  He has very minimal swelling in her ankles today.  I did give her a prescription for compression hose and discussed dietary changes such as salt reduction to prevent her from retaining fluid.      Relevant Orders   Ambulatory referral to Ophthalmology    Other Visit Diagnoses    Ankle edema    -  Primary   Blood in stool       Most likely due to internal hemorrhoids she is postpartum.  Denies any constipation.  Given fecal Hemoccult she did have some anemia   Relevant Orders   Fecal Globin By Immunochemistry   Proteinuria, unspecified type       Relevant Orders   Ambulatory referral to Ophthalmology   Vitreous floaters of both eyes       end of visit states she has had floaters since pregnanancy, likely associated with BP but will get eye exam   Relevant Orders   Ambulatory referral to Ophthalmology  Note: This dictation was prepared with Dragon dictation along with smaller phrase technology. Any transcriptional errors that result from this process are unintentional.

## 2020-04-18 NOTE — Assessment & Plan Note (Signed)
Blood pressure is controlled.  Her ankle edema may be related to the amlodipine however could also be residual from her preeclampsia and the proteinuria she was experiencing.  Will defer to nephrology before starting any type of diuretic.  He has very minimal swelling in her ankles today.  I did give her a prescription for compression hose and discussed dietary changes such as salt reduction to prevent her from retaining fluid.

## 2020-04-18 NOTE — Patient Instructions (Addendum)
Wear compression hose  Complete the stool card Referral to eye doctor  F/U as needed

## 2020-04-26 ENCOUNTER — Encounter: Payer: Self-pay | Admitting: Family Medicine

## 2020-04-28 ENCOUNTER — Other Ambulatory Visit: Payer: Medicaid Other

## 2020-05-03 DIAGNOSIS — Z20822 Contact with and (suspected) exposure to covid-19: Secondary | ICD-10-CM | POA: Diagnosis not present

## 2020-05-10 DIAGNOSIS — R350 Frequency of micturition: Secondary | ICD-10-CM | POA: Diagnosis not present

## 2020-05-23 DIAGNOSIS — Z20828 Contact with and (suspected) exposure to other viral communicable diseases: Secondary | ICD-10-CM | POA: Diagnosis not present

## 2020-05-25 ENCOUNTER — Telehealth: Payer: Medicaid Other | Admitting: Family

## 2020-05-25 DIAGNOSIS — R399 Unspecified symptoms and signs involving the genitourinary system: Secondary | ICD-10-CM | POA: Diagnosis not present

## 2020-05-25 MED ORDER — SULFAMETHOXAZOLE-TRIMETHOPRIM 800-160 MG PO TABS: 1.0000 | ORAL_TABLET | Freq: Two times a day (BID) | ORAL | 0 refills | Status: DC

## 2020-05-25 NOTE — Progress Notes (Signed)
We are sorry that you are not feeling well.  Here is how we plan to help!  Based on what you shared with me it looks like you most likely have a simple urinary tract infection.  A UTI (Urinary Tract Infection) is a bacterial infection of the bladder.  Most cases of urinary tract infections are simple to treat but a key part of your care is to encourage you to drink plenty of fluids and watch your symptoms carefully.  I have prescribed Bactrim DS One tablet twice a day for 7 days.  Your symptoms should gradually improve. Call us if the burning in your urine worsens, you develop worsening fever, back pain or pelvic pain or if your symptoms do not resolve after completing the antibiotic.  I will go ahead and treat today. We usually do not treat with recurrent UTI's, however, given COVID and the stress on the Urgent cares we will. However, if you your symptoms do not improve or worsen.  Urinary tract infections can be prevented by drinking plenty of water to keep your body hydrated.  Also be sure when you wipe, wipe from front to back and don't hold it in!  If possible, empty your bladder every 4 hours.  Your e-visit answers were reviewed by a board certified advanced clinical practitioner to complete your personal care plan.  Depending on the condition, your plan could have included both over the counter or prescription medications.  If there is a problem please reply  once you have received a response from your provider.  Your safety is important to [place].  If you have drug allergies check your prescription carefully.    You can use MyChart to ask questions about today's visit, request a non-urgent call back, or ask for a work or school excuse for 24 hours related to this e-Visit. If it has been greater than 24 hours you will need to follow up with your provider, or enter a new e-Visit to address those concerns.   You will get an e-mail in the next two days asking about your experience.  I hope  that your e-visit has been valuable and will speed your recovery. Thank you for using e-visits.  Approximately 5 minutes was spent documenting and reviewing patient's chart.

## 2020-05-31 ENCOUNTER — Other Ambulatory Visit: Payer: Medicaid Other

## 2020-06-25 ENCOUNTER — Telehealth: Payer: Medicaid Other | Admitting: Nurse Practitioner

## 2020-06-25 DIAGNOSIS — J069 Acute upper respiratory infection, unspecified: Secondary | ICD-10-CM | POA: Diagnosis not present

## 2020-06-25 MED ORDER — BENZONATATE 100 MG PO CAPS: 100.0000 mg | ORAL_CAPSULE | Freq: Three times a day (TID) | ORAL | 0 refills | Status: DC | PRN

## 2020-06-25 MED ORDER — FLUTICASONE PROPIONATE 50 MCG/ACT NA SUSP: 2.0000 | Freq: Every day | NASAL | 6 refills | Status: DC

## 2020-06-25 NOTE — Progress Notes (Signed)

## 2020-06-26 ENCOUNTER — Other Ambulatory Visit: Payer: Self-pay

## 2020-06-26 ENCOUNTER — Ambulatory Visit
Admission: RE | Admit: 2020-06-26 | Discharge: 2020-06-26 | Disposition: A | Discharge status: Home / Self Care | Payer: Medicaid Other | Source: Ambulatory Visit | Attending: Emergency Medicine | Admitting: Emergency Medicine

## 2020-06-26 VITALS — BP 129/60 | HR 96 | Temp 97.8°F | Resp 18

## 2020-06-26 DIAGNOSIS — J4531 Mild persistent asthma with (acute) exacerbation: Secondary | ICD-10-CM | POA: Diagnosis not present

## 2020-06-26 MED ORDER — BENZONATATE 100 MG PO CAPS: 100.0000 mg | ORAL_CAPSULE | Freq: Three times a day (TID) | ORAL | 0 refills | Status: DC | PRN

## 2020-06-26 MED ORDER — PREDNISONE 10 MG (21) PO TBPK: ORAL_TABLET | Freq: Every day | ORAL | 0 refills | Status: DC

## 2020-06-26 MED ORDER — AZITHROMYCIN 250 MG PO TABS: 250.0000 mg | ORAL_TABLET | Freq: Every day | ORAL | 0 refills | Status: DC

## 2020-06-26 NOTE — ED Triage Notes (Addendum)
Nasal congestion x 1 week. States she had an asthma attack last night because she couldn't breath out of her nose.  Drainage is making her cough. Pt states she does not want to be tested for covid

## 2020-06-26 NOTE — ED Provider Notes (Signed)
[place]   [id number] 06/26/20 Arrival Time: 0941   PP:[id number]  SUBJECTIVE: History from: patient and family.  [name] is a 40 y.o. female who presents with complaint of intermittent productive cough with yellow and green sputum and wheezing. Triggers: Unknown. Onset gradual, approximately 7 days ago. Describes wheezing as mild when present. Fever: no. Overall normal PO intake without n/v. Sick contacts: Unknown. Typically her asthma is well controlled. Denies fever, chills, nausea, vomiting, SOB, chest pain, abdominal pain, changes in bowel or bladder function.   Inhaler use: Albuterol.  ROS: As per HPI.  All other pertinent ROS negative.     Past Medical History:  Diagnosis Date  . Allergy    banana/pineapple  . Anemia    post pregnancy  . Anxiety   . Asthma   . Autoimmune disease, not elsewhere classified(279.49) 2013   Non specific- [facility], abnormal Bone Marrow signal  . Breast lump in female 11/28/2012   Has tender round mass at 10-11 0'clock right breast 4 finger breaths from [place]  . Breast mass, right 08/18/2012  . Chlamydia   . Chronic breast pain 05/31/2015  . Dysmenorrhea 03/23/2014  . Dyspareunia 01/05/2014  . Fibroids, intramural 08/24/2015  . GERD (gastroesophageal reflux disease)   . Gonorrhea   . Headache(784.0)   . History of abnormal cervical Pap smear 04/02/2014  . Hypertension   . LLQ abdominal tenderness 05/31/2015  . Menorrhagia 01/05/2014  . Neuromuscular disorder ([place])    lower back and bilat leg pain  . Obesity   . Other and unspecified ovarian cyst 01/12/2014  . Polyclonal gammopathy 07/26/2013   Insignificant  . Recurrent upper respiratory infection (URI)   . Scoliosis   . Urticaria   . Vaginal irritation 09/30/2014  . Vaginal Pap smear, abnormal    Past Surgical History:  Procedure Laterality Date  . ADENOIDECTOMY    . DILATION AND CURETTAGE OF UTERUS    . LAPAROSCOPIC BILATERAL SALPINGECTOMY  Bilateral 03/16/2020   Procedure: LAPAROSCOPIC BILATERAL SALPINGECTOMY;  Surgeon: Florian Buff, MD;  Location: [facility];  Service: Gynecology;  Laterality: Bilateral;  . SHOULDER SURGERY Right 03/10/2019  . TONSILLECTOMY     Allergies  Allergen Reactions  . Banana Rash  . Pineapple Rash   No current facility-administered medications on file prior to encounter.   Current Outpatient Medications on File Prior to Encounter  Medication Sig Dispense Refill  . albuterol (PROVENTIL HFA) 108 (90 Base) MCG/ACT inhaler Use 4 puffs every 4-6 hours as needed for cough or wheeze. (Patient taking differently: Inhale 4 puffs into the lungs every 6 (six) hours as needed for wheezing (cough).) 18 g 2  . amLODipine (NORVASC) 10 MG tablet Take 1 tablet (10 mg total) by mouth daily. 30 tablet 3  . Blood Pressure Monitoring (BLOOD PRESSURE CUFF) MISC 1 Device by Does not apply route once a week. To check pressure 3-5 times weekly (Patient taking differently: 1 Device by Does not apply route See admin instructions. To check pressure 3-5 times weekly) 1 each 0  . flunisolide (NASALIDE) 25 MCG/ACT (0.025%) SOLN Place 2 sprays into the nose 2 (two) times daily. (Patient taking differently: Place 2 sprays into the nose daily as needed (allergies).) 25 mL 5  . fluticasone (FLONASE) 50 MCG/ACT nasal spray Place 2 sprays into both nostrils daily. 16 g 6  . norethindrone (MICRONOR) 0.35 MG tablet Take 1 tablet (0.35 mg total) by mouth daily. Take 1 a day 28 tablet 11  .  Prenatal Vit-DSS-Fe Cbn-FA (PRENATAL AD PO) Take 1 tablet by mouth daily.     Marland Kitchen sulfamethoxazole-trimethoprim (BACTRIM DS) 800-160 MG tablet Take 1 tablet by mouth 2 (two) times daily. 14 tablet 0  . [DISCONTINUED] zolpidem (AMBIEN) 10 MG tablet TAKE (1) TABLET BY MOUTH AT BEDTIME. (Patient not taking: No sig reported) 15 tablet 2   Social History   Socioeconomic History  . Marital status: Married    Spouse name: Jameel  . Number of children: 1  .  Years of education: Not on file  . Highest education level: Not on file  Occupational History  . Not on file  Tobacco Use  . Smoking status: Never Smoker  . Smokeless tobacco: Never Used  Vaping Use  . Vaping Use: Never used  Substance and Sexual Activity  . Alcohol use: No  . Drug use: No  . Sexual activity: Yes    Birth control/protection: None  Other Topics Concern  . Not on file  Social History Narrative  . Not on file   Social Determinants of Health   Financial Resource Strain: Low Risk   . Difficulty of Paying Living Expenses: Not hard at all  Food Insecurity: No Food Insecurity  . Worried About Charity fundraiser in the Last Year: Never true  . Ran Out of Food in the Last Year: Never true  Transportation Needs: No Transportation Needs  . Lack of Transportation (Medical): No  . Lack of Transportation (Non-Medical): No  Physical Activity: Insufficiently Active  . Days of Exercise per Week: 2 days  . Minutes of Exercise per Session: 10 min  Stress: No Stress Concern Present  . Feeling of Stress : Not at all  Social Connections: Moderately Integrated  . Frequency of Communication with Friends and Family: More than three times a week  . Frequency of Social Gatherings with Friends and Family: Twice a week  . Attends Religious Services: 1 to 4 times per year  . Active Member of Clubs or Organizations: No  . Attends Archivist Meetings: Never  . Marital Status: Married  Human resources officer Violence: Not At Risk  . Fear of Current or Ex-Partner: No  . Emotionally Abused: No  . Physically Abused: No  . Sexually Abused: No   Family History  Problem Relation Age of Onset  . Hypertension Mother   . Diabetes Mother   . Heart disease Mother   . Diabetes Father   . Cancer Paternal Aunt        breast  . Hypertension Maternal Grandmother   . Diabetes Son        pre diabetic  . Other Son        overactive bladder  . COPD Maternal Aunt   . Leukemia Maternal  Aunt   . Allergic rhinitis Neg Hx   . Angioedema Neg Hx   . Asthma Neg Hx   . Atopy Neg Hx   . Eczema Neg Hx   . Immunodeficiency Neg Hx   . Urticaria Neg Hx     OBJECTIVE:  Vitals:   06/26/20 0945  BP: 129/60  Pulse: 96  Resp: 18  Temp: 97.8 F (36.6 C)  TempSrc: Oral  SpO2: 98%     General appearance: alert; appears fatigued HEENT: nasal congestion; clear runny nose; throat irritation secondary to post-nasal drainage Neck: supple without LAD Lungs: unlabored respirations, mild bilateral wheezing; cough: moderate; no significant respiratory distress Skin: warm and dry Psychological: alert and cooperative; normal mood and affect  Imaging: No results found.  ASSESSMENT & PLAN:  1. Mild persistent asthma with exacerbation       Meds ordered this encounter  Medications  . predniSONE (STERAPRED UNI-PAK 21 TAB) 10 MG (21) TBPK tablet    Sig: Take by mouth daily. Take 6 tabs by mouth daily  for 1 days, then 5 tabs for 1 days, then 4 tabs for 1 days, then 3 tabs for 1 days, 2 tabs for 1 days, then 1 tab by mouth daily for 1 days    Dispense:  21 tablet    Refill:  0  . azithromycin (ZITHROMAX) 250 MG tablet    Sig: Take 1 tablet (250 mg total) by mouth daily. Take first 2 tablets together, then 1 every day until finished.    Dispense:  6 tablet    Refill:  0  . benzonatate (TESSALON) 100 MG capsule    Sig: Take 1 capsule (100 mg total) by mouth 3 (three) times daily as needed for cough.    Dispense:  30 capsule    Refill:  0   Discharge instructions  Continue to take Zyrtec as prescribed and directed Continue to use ProAir as prescribed and directed Prednisone was prescribed take as directed Azithromycin was prescribed Tessalon Perles prescribed Take steroid as prescribed and to completion Follow up with PCP next week Return here or go to ER if you have any new or worsening symptoms such as shortness of breath, difficulty breathing, accessory muscle use, rib  retraction, or if symptoms do not improve with medication    Reviewed expectations re: course of current medical issues. Questions answered. Outlined signs and symptoms indicating need for more acute intervention. Patient verbalized understanding. After Visit Summary given.          Emerson Monte, FNP 06/26/20 1003

## 2020-06-26 NOTE — Discharge Instructions (Addendum)
Continue to take Zyrtec as prescribed and directed Continue to use ProAir as prescribed and directed Prednisone was prescribed take as directed Azithromycin was prescribed Tessalon Perles prescribed Take steroid as prescribed and to completion Follow up with PCP next week Return here or go to ER if you have any new or worsening symptoms such as shortness of breath, difficulty breathing, accessory muscle use, rib retraction, or if symptoms do not improve with medication

## 2020-07-01 ENCOUNTER — Encounter: Payer: Self-pay | Admitting: Internal Medicine

## 2020-07-01 ENCOUNTER — Other Ambulatory Visit: Payer: Self-pay

## 2020-07-01 ENCOUNTER — Ambulatory Visit: Payer: Medicaid Other | Admitting: Internal Medicine

## 2020-07-01 VITALS — BP 136/86 | HR 90 | Temp 98.3°F | Resp 18 | Ht 65.0 in | Wt 291.0 lb

## 2020-07-01 DIAGNOSIS — G8929 Other chronic pain: Secondary | ICD-10-CM | POA: Diagnosis not present

## 2020-07-01 DIAGNOSIS — M25473 Effusion, unspecified ankle: Secondary | ICD-10-CM | POA: Diagnosis not present

## 2020-07-01 DIAGNOSIS — M25511 Pain in right shoulder: Secondary | ICD-10-CM | POA: Diagnosis not present

## 2020-07-01 DIAGNOSIS — J4530 Mild persistent asthma, uncomplicated: Secondary | ICD-10-CM

## 2020-07-01 DIAGNOSIS — Z7689 Persons encountering health services in other specified circumstances: Secondary | ICD-10-CM

## 2020-07-01 DIAGNOSIS — I10 Essential (primary) hypertension: Secondary | ICD-10-CM

## 2020-07-01 MED ORDER — HYDROCHLOROTHIAZIDE 12.5 MG PO CAPS: 12.5000 mg | ORAL_CAPSULE | Freq: Every day | ORAL | 3 refills | Status: DC

## 2020-07-01 MED ORDER — AMLODIPINE BESYLATE 5 MG PO TABS: 5.0000 mg | ORAL_TABLET | Freq: Every day | ORAL | 1 refills | Status: DC

## 2020-07-01 MED ORDER — AMLODIPINE BESYLATE 10 MG PO TABS: 10.0000 mg | ORAL_TABLET | Freq: Every day | ORAL | 3 refills | Status: DC

## 2020-07-01 NOTE — Assessment & Plan Note (Signed)
S/p arthroscopic surgery Tylenol as needed Simple shoulder exercises, material provided If persistent pain, will get imaging and/or Orthopedic surgery referral

## 2020-07-01 NOTE — Progress Notes (Signed)
New Patient Office Visit  Subjective:  Patient ID: [name], female    DOB: 05-14-1980  Age: 40 y.o. MRN: [id number]  CC:  Chief Complaint  Patient presents with  . New Patient (Initial Visit)    New patient former dr Buelah Manis pt right shoulder had surgery a year ago still bothers her and out of bp med right ankle and foot have been swelling     HPI [name] is a 40 year old female with past medical history of hypertension, asthma, gestational diabetes and morbid obesity who presents for establishing care.  Her blood pressure is well controlled with amlodipine 10 mg once daily.  She denies any headache, dizziness, chest pain, dyspnea or palpitations.  Of note, she complains of bilateral leg/ankle swelling.  She was on hydrochlorothiazide in the past.  She complains of chronic right shoulder pain, for which she had arthroscopic surgery about a year ago.  She recently started having right shoulder pain/stiffness, intermittent, worse with movement beyond 90 degree and better with rest.  She states that she has to carry her baby and her stuff, which makes her pain worse.  She has been taking Aleve for pain.  She has a history of asthma, which she uses albuterol inhaler as needed.  She recently had a flare of asthma, treated with azithromycin and steroid Dosepak.  She has been feeling better now.  She denies any dyspnea or wheezing.  She has had 1 dose of Covid vaccine.  Past Medical History:  Diagnosis Date  . Allergy    banana/pineapple  . Anemia    post pregnancy  . Anxiety   . Asthma   . Autoimmune disease, not elsewhere classified(279.49) 2013   Non specific- [facility], abnormal Bone Marrow signal  . Breast lump in female 11/28/2012   Has tender round mass at 10-11 0'clock right breast 4 finger breaths from [place]  . Breast mass, right 08/18/2012  . Chlamydia   . Chronic breast pain 05/31/2015  . Dysmenorrhea 03/23/2014  . Dyspareunia  01/05/2014  . Fibroids, intramural 08/24/2015  . GERD (gastroesophageal reflux disease)   . Gonorrhea   . Headache(784.0)   . History of abnormal cervical Pap smear 04/02/2014  . Hypertension   . LLQ abdominal tenderness 05/31/2015  . Menorrhagia 01/05/2014  . Neuromuscular disorder ([place])    lower back and bilat leg pain  . Obesity   . Other and unspecified ovarian cyst 01/12/2014  . Polyclonal gammopathy 07/26/2013   Insignificant  . Recurrent upper respiratory infection (URI)   . Scoliosis   . Urticaria   . Vaginal irritation 09/30/2014  . Vaginal Pap smear, abnormal     Past Surgical History:  Procedure Laterality Date  . ADENOIDECTOMY    . DILATION AND CURETTAGE OF UTERUS    . LAPAROSCOPIC BILATERAL SALPINGECTOMY Bilateral 03/16/2020   Procedure: LAPAROSCOPIC BILATERAL SALPINGECTOMY;  Surgeon: Florian Buff, MD;  Location: [facility];  Service: Gynecology;  Laterality: Bilateral;  . SHOULDER SURGERY Right 03/10/2019  . TONSILLECTOMY    . TUBAL LIGATION N/A    [employer]reesia 06/28/2020    Family History  Problem Relation Age of Onset  . Hypertension Mother   . Diabetes Mother   . Heart disease Mother   . Diabetes Father   . Cancer Paternal Aunt        breast  . Hypertension Maternal Grandmother   . Diabetes Son        pre diabetic  . Other Son  overactive bladder  . COPD Maternal Aunt   . Leukemia Maternal Aunt   . Allergic rhinitis Neg Hx   . Angioedema Neg Hx   . Asthma Neg Hx   . Atopy Neg Hx   . Eczema Neg Hx   . Immunodeficiency Neg Hx   . Urticaria Neg Hx     Social History   Socioeconomic History  . Marital status: Married    Spouse name: Jameel  . Number of children: 1  . Years of education: Not on file  . Highest education level: Not on file  Occupational History  . Not on file  Tobacco Use  . Smoking status: Never Smoker  . Smokeless tobacco: Never Used  Vaping Use  . Vaping Use: Never used  Substance and Sexual Activity  . Alcohol use: No  .  Drug use: No  . Sexual activity: Yes    Birth control/protection: None  Other Topics Concern  . Not on file  Social History Narrative  . Not on file   Social Determinants of Health   Financial Resource Strain: Low Risk   . Difficulty of Paying Living Expenses: Not hard at all  Food Insecurity: No Food Insecurity  . Worried About Charity fundraiser in the Last Year: Never true  . Ran Out of Food in the Last Year: Never true  Transportation Needs: No Transportation Needs  . Lack of Transportation (Medical): No  . Lack of Transportation (Non-Medical): No  Physical Activity: Insufficiently Active  . Days of Exercise per Week: 2 days  . Minutes of Exercise per Session: 10 min  Stress: No Stress Concern Present  . Feeling of Stress : Not at all  Social Connections: Moderately Integrated  . Frequency of Communication with Friends and Family: More than three times a week  . Frequency of Social Gatherings with Friends and Family: Twice a week  . Attends Religious Services: 1 to 4 times per year  . Active Member of Clubs or Organizations: No  . Attends Archivist Meetings: Never  . Marital Status: Married  Human resources officer Violence: Not At Risk  . Fear of Current or Ex-Partner: No  . Emotionally Abused: No  . Physically Abused: No  . Sexually Abused: No    ROS Review of Systems  Constitutional: Negative for chills and fever.  HENT: Negative for congestion, sinus pressure, sinus pain and sore throat.   Eyes: Negative for pain and discharge.  Respiratory: Negative for cough and shortness of breath.   Cardiovascular: Positive for leg swelling. Negative for chest pain and palpitations.  Gastrointestinal: Negative for abdominal pain, constipation, diarrhea, nausea and vomiting.  Endocrine: Negative for polydipsia and polyuria.  Genitourinary: Negative for dysuria and hematuria.  Musculoskeletal: Positive for arthralgias (Right shoulder). Negative for neck pain and neck  stiffness.  Skin: Negative for rash.  Neurological: Negative for dizziness and weakness.  Psychiatric/Behavioral: Negative for agitation and behavioral problems.    Objective:   Today's Vitals: BP 136/86 (BP Location: Right Arm, Patient Position: Sitting, Cuff Size: Normal)   Pulse 90   Temp 98.3 F (36.8 C) (Oral)   Resp 18   Ht _0  (1.651 m)   Wt 291 lb (132 kg)   SpO2 97%   BMI 48.42 kg/m   Physical Exam Vitals reviewed.  Constitutional:      General: She is not in acute distress.    Appearance: She is not diaphoretic.  HENT:     Head: Normocephalic and atraumatic.  Nose: Nose normal.     Mouth/Throat:     Mouth: Mucous membranes are moist.  Eyes:     General: No scleral icterus.    Extraocular Movements: Extraocular movements intact.     Pupils: Pupils are equal, round, and reactive to light.  Cardiovascular:     Rate and Rhythm: Normal rate and regular rhythm.     Pulses: Normal pulses.     Heart sounds: Normal heart sounds. No murmur heard.   Pulmonary:     Breath sounds: Normal breath sounds. No wheezing or rales.  Abdominal:     Palpations: Abdomen is soft.     Tenderness: There is no abdominal tenderness.  Musculoskeletal:     Cervical back: Neck supple. No tenderness.     Right lower leg: Edema (1+) present.     Left lower leg: Edema (1+) present.  Skin:    General: Skin is warm.     Findings: No rash.  Neurological:     General: No focal deficit present.     Mental Status: She is alert and oriented to person, place, and time.  Psychiatric:        Mood and Affect: Mood normal.        Behavior: Behavior normal.     Assessment & Plan:   Problem List Items Addressed This Visit      Encounter to establish care - Primary   Care established Previous chart reviewed History and medications reviewed with the patient     Relevant Orders  CBC  CMP14+EGFR  Lipid panel  TSH  Vitamin D (25 hydroxy)  Hemoglobin A1c    Cardiovascular and  Mediastinum   Hypertension    BP Readings from Last 1 Encounters:  07/01/20 136/86   Well-controlled with Amlodipine 10 mg QD Due to ankle swelling, changed to Amlodipine 5 mg QD and HCTZ 12.5 mg QD Counseled for compliance with the medications Advised DASH diet and moderate exercise/walking, at least 150 mins/week       Relevant Medications   amLODipine (NORVASC) 5 MG tablet   hydrochlorothiazide (MICROZIDE) 12.5 MG capsule   Other Relevant Orders   CBC   CMP14+EGFR   TSH   Hemoglobin A1c     Respiratory   Mild persistent asthma, uncomplicated    Uses Albuterol PRN Recent asthma flare, now better with steroids If persistent symptoms, will start LABA + ICS        Other   Morbid obesity ([place])    Has been less active since delivery Plans to resume exercise DASH diet and moderate exercise/walking at least 150 mins/week      Relevant Orders   Lipid panel   TSH   Vitamin D (25 hydroxy)      Chronic right shoulder pain    S/p arthroscopic surgery Tylenol as needed Simple shoulder exercises, material provided If persistent pain, will get imaging and/or Orthopedic surgery referral       Other Visit Diagnoses    Ankle edema    Could be due to amlodipine and/or venous incompetence Decrease dose of amlodipine and started HCTZ Leg elevation   Relevant Medications   hydrochlorothiazide (MICROZIDE) 12.5 MG capsule      Outpatient Encounter Medications as of 07/01/2020  Medication Sig  . amLODipine (NORVASC) 5 MG tablet Take 1 tablet (5 mg total) by mouth daily.  . hydrochlorothiazide (MICROZIDE) 12.5 MG capsule Take 1 capsule (12.5 mg total) by mouth daily.  Marland Kitchen albuterol (PROVENTIL HFA) 108 (90 Base) MCG/ACT  inhaler Use 4 puffs every 4-6 hours as needed for cough or wheeze. (Patient taking differently: Inhale 4 puffs into the lungs every 6 (six) hours as needed for wheezing (cough).)  . azithromycin (ZITHROMAX) 250 MG tablet Take 1 tablet (250 mg total) by mouth daily.  Take first 2 tablets together, then 1 every day until finished.  . Blood Pressure Monitoring (BLOOD PRESSURE CUFF) MISC 1 Device by Does not apply route once a week. To check pressure 3-5 times weekly (Patient taking differently: 1 Device by Does not apply route See admin instructions. To check pressure 3-5 times weekly)  . flunisolide (NASALIDE) 25 MCG/ACT (0.025%) SOLN Place 2 sprays into the nose 2 (two) times daily. (Patient taking differently: Place 2 sprays into the nose daily as needed (allergies).)  . fluticasone (FLONASE) 50 MCG/ACT nasal spray Place 2 sprays into both nostrils daily.  . norethindrone (MICRONOR) 0.35 MG tablet Take 1 tablet (0.35 mg total) by mouth daily. Take 1 a day  . predniSONE (STERAPRED UNI-PAK 21 TAB) 10 MG (21) TBPK tablet Take by mouth daily. Take 6 tabs by mouth daily  for 1 days, then 5 tabs for 1 days, then 4 tabs for 1 days, then 3 tabs for 1 days, 2 tabs for 1 days, then 1 tab by mouth daily for 1 days  . Prenatal Vit-DSS-Fe Cbn-FA (PRENATAL AD PO) Take 1 tablet by mouth daily.   . [DISCONTINUED] amLODipine (NORVASC) 10 MG tablet Take 1 tablet (10 mg total) by mouth daily.  . [DISCONTINUED] amLODipine (NORVASC) 10 MG tablet Take 1 tablet (10 mg total) by mouth daily.  . [DISCONTINUED] benzonatate (TESSALON) 100 MG capsule Take 1 capsule (100 mg total) by mouth 3 (three) times daily as needed for cough. (Patient not taking: Reported on 07/01/2020)  . [DISCONTINUED] sulfamethoxazole-trimethoprim (BACTRIM DS) 800-160 MG tablet Take 1 tablet by mouth 2 (two) times daily.  . [DISCONTINUED] zolpidem (AMBIEN) 10 MG tablet TAKE (1) TABLET BY MOUTH AT BEDTIME. (Patient not taking: No sig reported)   No facility-administered encounter medications on file as of 07/01/2020.    Follow-up: Return in about 3 months (around 10/01/2020) for HTN and blood tests review.   [name], MD

## 2020-07-01 NOTE — Assessment & Plan Note (Signed)
Care established Previous chart reviewed History and medications reviewed with the patient 

## 2020-07-01 NOTE — Assessment & Plan Note (Signed)
Uses Albuterol PRN Recent asthma flare, now better with steroids If persistent symptoms, will start LABA + ICS

## 2020-07-01 NOTE — Patient Instructions (Addendum)
Please start taking Amlodipine 5 mg once daily and HCTZ 12.5 mg once daily.  Please perform simple shoulder exercises as given in the material. Okay to take Tylenol for shoulder pain, up to 3 times in a day.  Please get fasting blood tests done before the next visit.  Please follow DASH diet and perform moderate exercise/walking at least 150 mins/week.   [email or web address]  DASH Eating Plan DASH stands for Dietary Approaches to Stop Hypertension. The DASH eating plan is a healthy eating plan that has been shown to:  Reduce high blood pressure (hypertension).  Reduce your risk for type 2 diabetes, heart disease, and stroke.  Help with weight loss. What are tips for following this plan? Reading food labels  Check food labels for the amount of salt (sodium) per serving. Choose foods with less than 5 percent of the Daily Value of sodium. Generally, foods with less than 300 milligrams (mg) of sodium per serving fit into this eating plan.  To find whole grains, look for the word "whole" as the first word in the ingredient list. Shopping  Buy products labeled as "low-sodium" or "no salt added."  Buy fresh foods. Avoid canned foods and pre-made or frozen meals. Cooking  Avoid adding salt when cooking. Use salt-free seasonings or herbs instead of table salt or sea salt. Check with your health care provider or pharmacist before using salt substitutes.  Do not fry foods. Cook foods using healthy methods such as baking, boiling, grilling, roasting, and broiling instead.  Cook with heart-healthy oils, such as olive, canola, avocado, soybean, or sunflower oil. Meal planning  Eat a balanced diet that includes: ? 4 or more servings of fruits and 4 or more servings of vegetables each day. Try to fill one-half of your plate with fruits and vegetables. ? 6-8 servings of whole grains each day. ? Less than 6 oz (170 g) of lean meat, poultry, or fish  each day. A 3-oz (85-g) serving of meat is about the same size as a deck of cards. One egg equals 1 oz (28 g). ? 2-3 servings of low-fat dairy each day. One serving is 1 cup (237 mL). ? 1 serving of nuts, seeds, or beans 5 times each week. ? 2-3 servings of heart-healthy fats. Healthy fats called omega-3 fatty acids are found in foods such as walnuts, flaxseeds, fortified milks, and eggs. These fats are also found in cold-water fish, such as sardines, salmon, and mackerel.  Limit how much you eat of: ? Canned or prepackaged foods. ? Food that is high in trans fat, such as some fried foods. ? Food that is high in saturated fat, such as fatty meat. ? Desserts and other sweets, sugary drinks, and other foods with added sugar. ? Full-fat dairy products.  Do not salt foods before eating.  Do not eat more than 4 egg yolks a week.  Try to eat at least 2 vegetarian meals a week.  Eat more home-cooked food and less restaurant, buffet, and fast food.   Lifestyle  When eating at a restaurant, ask that your food be prepared with less salt or no salt, if possible.  If you drink alcohol: ? Limit how much you use to:  0-1 drink a day for women who are not pregnant.  0-2 drinks a day for men. ? Be aware of how much alcohol is in your drink. In the [place] one drink equals one 12 oz bottle of beer (355 mL), one 5 oz glass of wine (148  mL), or one 1 oz glass of hard liquor (44 mL). General information  Avoid eating more than 2,300 mg of salt a day. If you have hypertension, you may need to reduce your sodium intake to 1,500 mg a day.  Work with your health care provider to maintain a healthy body weight or to lose weight. Ask what an ideal weight is for you.  Get at least 30 minutes of exercise that causes your heart to beat faster (aerobic exercise) most days of the week. Activities may include walking, swimming, or biking.  Work with your health care provider or dietitian to adjust your eating  plan to your individual calorie needs. What foods should I eat? Fruits All fresh, dried, or frozen fruit. Canned fruit in natural juice (without added sugar). Vegetables Fresh or frozen vegetables (raw, steamed, roasted, or grilled). Low-sodium or reduced-sodium tomato and vegetable juice. Low-sodium or reduced-sodium tomato sauce and tomato paste. Low-sodium or reduced-sodium canned vegetables. Grains Whole-grain or whole-wheat bread. Whole-grain or whole-wheat pasta. Brown rice. [place]. Bulgur. Whole-grain and low-sodium cereals. Pita bread. Low-fat, low-sodium crackers. Whole-wheat flour tortillas. Meats and other proteins Skinless chicken or [place]. Ground chicken or [place]. Pork with fat trimmed off. Fish and seafood. Egg whites. Dried beans, peas, or lentils. Unsalted nuts, nut butters, and seeds. Unsalted canned beans. Lean cuts of beef with fat trimmed off. Low-sodium, lean precooked or cured meat, such as sausages or meat loaves. Dairy Low-fat (1%) or fat-free (skim) milk. Reduced-fat, low-fat, or fat-free cheeses. Nonfat, low-sodium ricotta or cottage cheese. Low-fat or nonfat yogurt. Low-fat, low-sodium cheese. Fats and oils Soft margarine without trans fats. Vegetable oil. Reduced-fat, low-fat, or light mayonnaise and salad dressings (reduced-sodium). Canola, safflower, olive, avocado, soybean, and sunflower oils. Avocado. Seasonings and condiments Herbs. Spices. Seasoning mixes without salt. Other foods Unsalted popcorn and pretzels. Fat-free sweets. The items listed above may not be a complete list of foods and beverages you can eat. Contact a dietitian for more information. What foods should I avoid? Fruits Canned fruit in a light or heavy syrup. Fried fruit. Fruit in cream or butter sauce. Vegetables Creamed or fried vegetables. Vegetables in a cheese sauce. Regular canned vegetables (not low-sodium or reduced-sodium). Regular canned tomato sauce and paste (not  low-sodium or reduced-sodium). Regular tomato and vegetable juice (not low-sodium or reduced-sodium). Angie Fava. Olives. Grains Baked goods made with fat, such as croissants, muffins, or some breads. Dry pasta or rice meal packs. Meats and other proteins Fatty cuts of meat. Ribs. Fried meat. Berniece Salines. Bologna, salami, and other precooked or cured meats, such as sausages or meat loaves. Fat from the back of a pig (fatback). Bratwurst. Salted nuts and seeds. Canned beans with added salt. Canned or smoked fish. Whole eggs or egg yolks. Chicken or [place] with skin. Dairy Whole or 2% milk, cream, and half-and-half. Whole or full-fat cream cheese. Whole-fat or sweetened yogurt. Full-fat cheese. Nondairy creamers. Whipped toppings. Processed cheese and cheese spreads. Fats and oils Butter. Stick margarine. Lard. Shortening. Ghee. Bacon fat. Tropical oils, such as coconut, palm kernel, or palm oil. Seasonings and condiments Onion salt, garlic salt, seasoned salt, table salt, and sea salt. Worcestershire sauce. Tartar sauce. Barbecue sauce. Teriyaki sauce. Soy sauce, including reduced-sodium. Steak sauce. Canned and packaged gravies. Fish sauce. Oyster sauce. Cocktail sauce. Store-bought horseradish. Ketchup. Mustard. Meat flavorings and tenderizers. Bouillon cubes. Hot sauces. Pre-made or packaged marinades. Pre-made or packaged taco seasonings. Relishes. Regular salad dressings. Other foods Salted popcorn and pretzels. The items listed above  may not be a complete list of foods and beverages you should avoid. Contact a dietitian for more information. Where to find more information  National Heart, Lung, and Blood Institute: https://wilson-eaton.com/  American Heart Association: www.heart.org  Academy of Nutrition and Dietetics: www.eatright.Cool: www.kidney.org Summary  The DASH eating plan is a healthy eating plan that has been shown to reduce high blood pressure (hypertension). It  may also reduce your risk for type 2 diabetes, heart disease, and stroke.  When on the DASH eating plan, aim to eat more fresh fruits and vegetables, whole grains, lean proteins, low-fat dairy, and heart-healthy fats.  With the DASH eating plan, you should limit salt (sodium) intake to 2,300 mg a day. If you have hypertension, you may need to reduce your sodium intake to 1,500 mg a day.  Work with your health care provider or dietitian to adjust your eating plan to your individual calorie needs. This information is not intended to replace advice given to you by your health care provider. Make sure you discuss any questions you have with your health care provider. Document Revised: 03/20/2019 Document Reviewed: 03/20/2019 Elsevier Patient Education  2021 [employer].

## 2020-07-01 NOTE — Assessment & Plan Note (Signed)
BP Readings from Last 1 Encounters:  07/01/20 136/86   Well-controlled with Amlodipine 10 mg QD Due to ankle swelling, changed to Amlodipine 5 mg QD and HCTZ 12.5 mg QD Counseled for compliance with the medications Advised DASH diet and moderate exercise/walking, at least 150 mins/week

## 2020-07-01 NOTE — Assessment & Plan Note (Signed)
Has been less active since delivery Plans to resume exercise DASH diet and moderate exercise/walking at least 150 mins/week

## 2020-07-06 ENCOUNTER — Telehealth: Payer: Medicaid Other | Admitting: Nurse Practitioner

## 2020-07-06 DIAGNOSIS — N760 Acute vaginitis: Secondary | ICD-10-CM

## 2020-07-06 DIAGNOSIS — B9689 Other specified bacterial agents as the cause of diseases classified elsewhere: Secondary | ICD-10-CM | POA: Diagnosis not present

## 2020-07-06 MED ORDER — METRONIDAZOLE 500 MG PO TABS: 500.0000 mg | ORAL_TABLET | Freq: Two times a day (BID) | ORAL | 0 refills | Status: DC

## 2020-07-06 NOTE — Progress Notes (Signed)

## 2020-08-17 ENCOUNTER — Other Ambulatory Visit: Payer: Self-pay

## 2020-08-17 ENCOUNTER — Ambulatory Visit: Payer: Medicaid Other | Admitting: Adult Health

## 2020-08-17 ENCOUNTER — Encounter: Payer: Self-pay | Admitting: Adult Health

## 2020-08-17 VITALS — BP 139/94 | HR 83 | Ht 65.0 in | Wt 296.4 lb

## 2020-08-17 DIAGNOSIS — N941 Unspecified dyspareunia: Secondary | ICD-10-CM | POA: Diagnosis not present

## 2020-08-17 DIAGNOSIS — N921 Excessive and frequent menstruation with irregular cycle: Secondary | ICD-10-CM | POA: Diagnosis not present

## 2020-08-17 MED ORDER — MEGESTROL ACETATE 40 MG PO TABS: ORAL_TABLET | ORAL | 2 refills | Status: DC

## 2020-08-17 NOTE — Progress Notes (Signed)
  Subjective:     Patient ID: [name], female   DOB: May 19, 1980, 40 y.o.   MRN: [id number]  HPI Dali is a 40 year old black female, married, G2P2 in complaining of heavy periods and clots, and irregular bleeding. She had bilateral salpingectomy in November 2021 and placed on Micronor for period control and periods were good November, December and January. But February period lasted 12 days stopped then bleed 4 more days, and then March was 7 days and then stopped and bleed 4 more days and April was 7 days. Her periods are heavy and she messes up her clothes. PCP is Dr Posey Pronto  Review of Systems Has heavy periods esp at night, may change every hour and wears depends over pads Has clots and irregular bleeding Has pain with sex at times She denies any weakness, shortness of breath or fatigue  Reviewed past medical,surgical, social and family history. Reviewed medications and allergies.     Objective:   Physical Exam BP (!) 139/94 (BP Location: Right Arm, Patient Position: Sitting, Cuff Size: Large)   Pulse 83   Ht 5\' 5"  (1.651 m)   Wt 296 lb 6.4 oz (134.4 kg)   LMP 08/06/2020 (Exact Date)   Breastfeeding No   BMI 49.32 kg/m  Skin warm and dry. Lungs: clear to ausculation bilaterally. Cardiovascular: regular rate and rhythm.     Upstream - 08/17/20 1208      Pregnancy Intention Screening   Does the patient want to become pregnant in the next year? No    Does the patient's partner want to become pregnant in the next year? No    Would the patient like to discuss contraceptive options today? No      Contraception Wrap Up   Current Method Female Sterilization    End Method Female Sterilization    Contraception Counseling Provided No          Assessment:     1. Menorrhagia with irregular cycle Stop Micronor and will rx megace to see if can stop the bleeding  Meds ordered this encounter  Medications  . megestrol (MEGACE) 40 MG tablet    Sig: Take 2 daily     Dispense:  60 tablet    Refill:  2    Order Specific Question:   Supervising Provider    Answer:   Florian Buff [[id number]]   Will get GYN [place] in about 4 weeks Refer handout on endometrial ablation  2. Dyspareunia, female     Plan:     Will talk after [place] and see if megace works

## 2020-09-14 ENCOUNTER — Other Ambulatory Visit: Payer: Medicaid Other

## 2020-09-28 DIAGNOSIS — Z7689 Persons encountering health services in other specified circumstances: Secondary | ICD-10-CM | POA: Diagnosis not present

## 2020-09-28 DIAGNOSIS — I10 Essential (primary) hypertension: Secondary | ICD-10-CM | POA: Diagnosis not present

## 2020-09-29 LAB — LIPID PANEL
Chol/HDL Ratio: 3.2 ratio (ref 0.0–4.4)
Cholesterol, Total: 116 mg/dL (ref 100–199)
HDL: 36 mg/dL — ABNORMAL LOW (ref >39)
LDL Chol Calc (NIH): 62 mg/dL (ref 0–99)
Triglycerides: 92 mg/dL (ref 0–149)
VLDL Cholesterol Cal: 18 mg/dL (ref 5–40)

## 2020-09-29 LAB — TSH: TSH: 1.16 u[IU]/mL (ref 0.450–4.500)

## 2020-09-29 LAB — CBC
Hematocrit: 38.9 % (ref 34.0–46.6)
Hemoglobin: 12.9 g/dL (ref 11.1–15.9)
MCH: 28.7 pg (ref 26.6–33.0)
MCHC: 33.2 g/dL (ref 31.5–35.7)
MCV: 87 fL (ref 79–97)
Platelets: 362 10*3/uL (ref 150–450)
RBC: 4.49 x10E6/uL (ref 3.77–5.28)
RDW: 12.9 % (ref 11.7–15.4)
WBC: 10.6 10*3/uL (ref 3.4–10.8)

## 2020-09-29 LAB — CMP14+EGFR
ALT: 6 IU/L (ref 0–32)
AST: 10 IU/L (ref 0–40)
Albumin/Globulin Ratio: 1.6 (ref 1.2–2.2)
Albumin: 4.1 g/dL (ref 3.8–4.8)
Alkaline Phosphatase: 99 IU/L (ref 44–121)
BUN/Creatinine Ratio: 12 (ref 9–23)
BUN: 10 mg/dL (ref 6–20)
Bilirubin Total: 0.6 mg/dL (ref 0.0–1.2)
CO2: 20 mmol/L (ref 20–29)
Calcium: 9.1 mg/dL (ref 8.7–10.2)
Chloride: 103 mmol/L (ref 96–106)
Creatinine, Ser: 0.83 mg/dL (ref 0.57–1.00)
Globulin, Total: 2.6 g/dL (ref 1.5–4.5)
Glucose: 142 mg/dL — ABNORMAL HIGH (ref 65–99)
Potassium: 3.2 mmol/L — ABNORMAL LOW (ref 3.5–5.2)
Sodium: 140 mmol/L (ref 134–144)
Total Protein: 6.7 g/dL (ref 6.0–8.5)
eGFR: 93 mL/min/{1.73_m2} (ref >59)

## 2020-09-29 LAB — HEMOGLOBIN A1C
Est. average glucose Bld gHb Est-mCnc: 108 mg/dL
Hgb A1c MFr Bld: 5.4 % (ref 4.8–5.6)

## 2020-09-29 LAB — VITAMIN D 25 HYDROXY (VIT D DEFICIENCY, FRACTURES): Vit D, 25-Hydroxy: 12.5 ng/mL — ABNORMAL LOW (ref 30.0–100.0)

## 2020-09-30 ENCOUNTER — Ambulatory Visit: Payer: Medicaid Other | Admitting: Internal Medicine

## 2020-09-30 ENCOUNTER — Encounter: Payer: Self-pay | Admitting: Internal Medicine

## 2020-09-30 ENCOUNTER — Other Ambulatory Visit: Payer: Self-pay

## 2020-09-30 VITALS — BP 139/87 | HR 93 | Temp 98.5°F | Resp 18 | Ht 65.0 in | Wt 288.0 lb

## 2020-09-30 DIAGNOSIS — J4530 Mild persistent asthma, uncomplicated: Secondary | ICD-10-CM | POA: Diagnosis not present

## 2020-09-30 DIAGNOSIS — I10 Essential (primary) hypertension: Secondary | ICD-10-CM | POA: Diagnosis not present

## 2020-09-30 DIAGNOSIS — E876 Hypokalemia: Secondary | ICD-10-CM | POA: Diagnosis not present

## 2020-09-30 DIAGNOSIS — N3000 Acute cystitis without hematuria: Secondary | ICD-10-CM

## 2020-09-30 DIAGNOSIS — E559 Vitamin D deficiency, unspecified: Secondary | ICD-10-CM

## 2020-09-30 DIAGNOSIS — R8290 Unspecified abnormal findings in urine: Secondary | ICD-10-CM | POA: Diagnosis not present

## 2020-09-30 LAB — POCT URINALYSIS DIP (CLINITEK)
Glucose, UA: NEGATIVE mg/dL
Nitrite, UA: POSITIVE — AB
POC PROTEIN,UA: 100 — AB
Spec Grav, UA: 1.015 (ref 1.010–1.025)
Urobilinogen, UA: 1 E.U./dL
pH, UA: 7 (ref 5.0–8.0)

## 2020-09-30 MED ORDER — POTASSIUM CHLORIDE CRYS ER 20 MEQ PO TBCR: 20.0000 meq | EXTENDED_RELEASE_TABLET | Freq: Every day | ORAL | 3 refills | Status: DC

## 2020-09-30 MED ORDER — SULFAMETHOXAZOLE-TRIMETHOPRIM 800-160 MG PO TABS: 1.0000 | ORAL_TABLET | Freq: Two times a day (BID) | ORAL | 0 refills | Status: DC

## 2020-09-30 MED ORDER — VITAMIN D (ERGOCALCIFEROL) 1.25 MG (50000 UNIT) PO CAPS: 50000.0000 [IU] | ORAL_CAPSULE | ORAL | 5 refills | Status: DC

## 2020-09-30 MED ORDER — FLUTICASONE-SALMETEROL 115-21 MCG/ACT IN AERO: 2.0000 | INHALATION_SPRAY | Freq: Two times a day (BID) | RESPIRATORY_TRACT | 12 refills | Status: AC

## 2020-09-30 MED ORDER — ALBUTEROL SULFATE HFA 108 (90 BASE) MCG/ACT IN AERS: 2.0000 | INHALATION_SPRAY | Freq: Four times a day (QID) | RESPIRATORY_TRACT | 5 refills | Status: AC | PRN

## 2020-09-30 MED ORDER — HYDROCHLOROTHIAZIDE 12.5 MG PO CAPS: 12.5000 mg | ORAL_CAPSULE | Freq: Every day | ORAL | 1 refills | Status: DC

## 2020-09-30 NOTE — Assessment & Plan Note (Signed)
Frequent use of Albuterol Will add Advair as maintenance treatment

## 2020-09-30 NOTE — Patient Instructions (Addendum)
Please start taking Bactrim for UTI.  Please start taking Vitamin D and potassium tablets as prescribed.  Please start using Advair inhaler and use Albuterol only as needed for shortness of breath/wheezing.  Please continue taking other medications as prescribed.  Please follow DASH diet and perform moderate exercise/walking at least 150 mins/week.   [email or web address]">  DASH Eating Plan DASH stands for Dietary Approaches to Stop Hypertension. The DASH eating plan is a healthy eating plan that has been shown to:  Reduce high blood pressure (hypertension).  Reduce your risk for type 2 diabetes, heart disease, and stroke.  Help with weight loss. What are tips for following this plan? Reading food labels  Check food labels for the amount of salt (sodium) per serving. Choose foods with less than 5 percent of the Daily Value of sodium. Generally, foods with less than 300 milligrams (mg) of sodium per serving fit into this eating plan.  To find whole grains, look for the word "whole" as the first word in the ingredient list. Shopping  Buy products labeled as "low-sodium" or "no salt added."  Buy fresh foods. Avoid canned foods and pre-made or frozen meals. Cooking  Avoid adding salt when cooking. Use salt-free seasonings or herbs instead of table salt or sea salt. Check with your health care provider or pharmacist before using salt substitutes.  Do not fry foods. Cook foods using healthy methods such as baking, boiling, grilling, roasting, and broiling instead.  Cook with heart-healthy oils, such as olive, canola, avocado, soybean, or sunflower oil. Meal planning  Eat a balanced diet that includes: ? 4 or more servings of fruits and 4 or more servings of vegetables each day. Try to fill one-half of your plate with fruits and vegetables. ? 6-8 servings of whole grains each day. ? Less than 6 oz (170 g) of lean meat, poultry, or fish  each day. A 3-oz (85-g) serving of meat is about the same size as a deck of cards. One egg equals 1 oz (28 g). ? 2-3 servings of low-fat dairy each day. One serving is 1 cup (237 mL). ? 1 serving of nuts, seeds, or beans 5 times each week. ? 2-3 servings of heart-healthy fats. Healthy fats called omega-3 fatty acids are found in foods such as walnuts, flaxseeds, fortified milks, and eggs. These fats are also found in cold-water fish, such as sardines, salmon, and mackerel.  Limit how much you eat of: ? Canned or prepackaged foods. ? Food that is high in trans fat, such as some fried foods. ? Food that is high in saturated fat, such as fatty meat. ? Desserts and other sweets, sugary drinks, and other foods with added sugar. ? Full-fat dairy products.  Do not salt foods before eating.  Do not eat more than 4 egg yolks a week.  Try to eat at least 2 vegetarian meals a week.  Eat more home-cooked food and less restaurant, buffet, and fast food.   Lifestyle  When eating at a restaurant, ask that your food be prepared with less salt or no salt, if possible.  If you drink alcohol: ? Limit how much you use to:  0-1 drink a day for women who are not pregnant.  0-2 drinks a day for men. ? Be aware of how much alcohol is in your drink. In the [place] one drink equals one 12 oz bottle of beer (355 mL), one 5 oz glass of wine (148 mL), or one 1 oz glass of hard liquor (  44 mL). General information  Avoid eating more than 2,300 mg of salt a day. If you have hypertension, you may need to reduce your sodium intake to 1,500 mg a day.  Work with your health care provider to maintain a healthy body weight or to lose weight. Ask what an ideal weight is for you.  Get at least 30 minutes of exercise that causes your heart to beat faster (aerobic exercise) most days of the week. Activities may include walking, swimming, or biking.  Work with your health care provider or dietitian to adjust your eating  plan to your individual calorie needs. What foods should I eat? Fruits All fresh, dried, or frozen fruit. Canned fruit in natural juice (without added sugar). Vegetables Fresh or frozen vegetables (raw, steamed, roasted, or grilled). Low-sodium or reduced-sodium tomato and vegetable juice. Low-sodium or reduced-sodium tomato sauce and tomato paste. Low-sodium or reduced-sodium canned vegetables. Grains Whole-grain or whole-wheat bread. Whole-grain or whole-wheat pasta. Brown rice. [place]. Bulgur. Whole-grain and low-sodium cereals. Pita bread. Low-fat, low-sodium crackers. Whole-wheat flour tortillas. Meats and other proteins Skinless chicken or [place]. Ground chicken or [place]. Pork with fat trimmed off. Fish and seafood. Egg whites. Dried beans, peas, or lentils. Unsalted nuts, nut butters, and seeds. Unsalted canned beans. Lean cuts of beef with fat trimmed off. Low-sodium, lean precooked or cured meat, such as sausages or meat loaves. Dairy Low-fat (1%) or fat-free (skim) milk. Reduced-fat, low-fat, or fat-free cheeses. Nonfat, low-sodium ricotta or cottage cheese. Low-fat or nonfat yogurt. Low-fat, low-sodium cheese. Fats and oils Soft margarine without trans fats. Vegetable oil. Reduced-fat, low-fat, or light mayonnaise and salad dressings (reduced-sodium). Canola, safflower, olive, avocado, soybean, and sunflower oils. Avocado. Seasonings and condiments Herbs. Spices. Seasoning mixes without salt. Other foods Unsalted popcorn and pretzels. Fat-free sweets. The items listed above may not be a complete list of foods and beverages you can eat. Contact a dietitian for more information. What foods should I avoid? Fruits Canned fruit in a light or heavy syrup. Fried fruit. Fruit in cream or butter sauce. Vegetables Creamed or fried vegetables. Vegetables in a cheese sauce. Regular canned vegetables (not low-sodium or reduced-sodium). Regular canned tomato sauce and paste (not  low-sodium or reduced-sodium). Regular tomato and vegetable juice (not low-sodium or reduced-sodium). Angie Fava. Olives. Grains Baked goods made with fat, such as croissants, muffins, or some breads. Dry pasta or rice meal packs. Meats and other proteins Fatty cuts of meat. Ribs. Fried meat. Berniece Salines. Bologna, salami, and other precooked or cured meats, such as sausages or meat loaves. Fat from the back of a pig (fatback). Bratwurst. Salted nuts and seeds. Canned beans with added salt. Canned or smoked fish. Whole eggs or egg yolks. Chicken or [place] with skin. Dairy Whole or 2% milk, cream, and half-and-half. Whole or full-fat cream cheese. Whole-fat or sweetened yogurt. Full-fat cheese. Nondairy creamers. Whipped toppings. Processed cheese and cheese spreads. Fats and oils Butter. Stick margarine. Lard. Shortening. Ghee. Bacon fat. Tropical oils, such as coconut, palm kernel, or palm oil. Seasonings and condiments Onion salt, garlic salt, seasoned salt, table salt, and sea salt. Worcestershire sauce. Tartar sauce. Barbecue sauce. Teriyaki sauce. Soy sauce, including reduced-sodium. Steak sauce. Canned and packaged gravies. Fish sauce. Oyster sauce. Cocktail sauce. Store-bought horseradish. Ketchup. Mustard. Meat flavorings and tenderizers. Bouillon cubes. Hot sauces. Pre-made or packaged marinades. Pre-made or packaged taco seasonings. Relishes. Regular salad dressings. Other foods Salted popcorn and pretzels. The items listed above may not be a complete list of foods and  beverages you should avoid. Contact a dietitian for more information. Where to find more information  National Heart, Lung, and Blood Institute: https://wilson-eaton.com/  American Heart Association: www.heart.org  Academy of Nutrition and Dietetics: www.eatright.[place]: www.kidney.org Summary  The DASH eating plan is a healthy eating plan that has been shown to reduce high blood pressure (hypertension). It  may also reduce your risk for type 2 diabetes, heart disease, and stroke.  When on the DASH eating plan, aim to eat more fresh fruits and vegetables, whole grains, lean proteins, low-fat dairy, and heart-healthy fats.  With the DASH eating plan, you should limit salt (sodium) intake to 2,300 mg a day. If you have hypertension, you may need to reduce your sodium intake to 1,500 mg a day.  Work with your health care provider or dietitian to adjust your eating plan to your individual calorie needs. This information is not intended to replace advice given to you by your health care provider. Make sure you discuss any questions you have with your health care provider. Document Revised: 03/20/2019 Document Reviewed: 03/20/2019 Elsevier Patient Education  2021 [employer].

## 2020-09-30 NOTE — Assessment & Plan Note (Signed)
Has been less active since delivery Plans to resume exercise DASH diet and moderate exercise/walking at least 150 mins/week

## 2020-09-30 NOTE — Progress Notes (Signed)
Established Patient Office Visit  Subjective:  Patient ID: [name], female    DOB: 05-Dec-1980  Age: 40 y.o. MRN: [id number]  CC:  Chief Complaint  Patient presents with  . Follow-up    3 month follow up pt has strong urine feels like uti also asthma is getting worse lately and since having her daughter she is having restless legs     HPI [name]  is a 40 year old female with past medical history of hypertension, asthma, gestational diabetes and morbid obesity who presents for follow up of her chronic medical conditions and blood tests review.  HTN: BP is well-controlled. Takes medications regularly. Patient denies headache, dizziness, chest pain, dyspnea or palpitations.  UTI: She has been having foul smelling urine for last 3 weeks. Denies dysuria or hematuria. Denies fever, chills, nausea or vomiting. She has had frequent episodes of UTI in the past since she had AKI due to hypermagnesemia during her pregnancy.  Asthma: She has been having frequent spells of dyspnea and wheezing, and has been using Albuterol frequently, almost everyday since moving to her new apartment. She reports dealing with molds. Denies any nighttime awakening currently.  She has been having intermittent numbness and pain in the legs especially at nighttime, which gets somewhat better with walking. Of note, her potassium was found to be low in CMP.  Blood tests were reviewed and discussed with the patient in detail.  Past Medical History:  Diagnosis Date  . Allergy    banana/pineapple  . Anemia    post pregnancy  . Anxiety   . Asthma   . Autoimmune disease, not elsewhere classified(279.49) 2013   Non specific- [facility], abnormal Bone Marrow signal  . Breast lump in female 11/28/2012   Has tender round mass at 10-11 0'clock right breast 4 finger breaths from [place] will [place]  . Breast mass, right 08/18/2012  . Chlamydia   . Chronic breast pain 05/31/2015  . Dysmenorrhea  03/23/2014  . Dyspareunia 01/05/2014  . Fibroids, intramural 08/24/2015  . GERD (gastroesophageal reflux disease)   . Gonorrhea   . Headache(784.0)   . History of abnormal cervical Pap smear 04/02/2014  . Hypertension   . LLQ abdominal tenderness 05/31/2015  . Menorrhagia 01/05/2014  . Neuromuscular disorder ([place])    lower back and bilat leg pain  . Obesity   . Other and unspecified ovarian cyst 01/12/2014  . Polyclonal gammopathy 07/26/2013   Insignificant  . Recurrent upper respiratory infection (URI)   . Scoliosis   . Urticaria   . Vaginal irritation 09/30/2014  . Vaginal Pap smear, abnormal     Past Surgical History:  Procedure Laterality Date  . ADENOIDECTOMY    . DILATION AND CURETTAGE OF UTERUS    . LAPAROSCOPIC BILATERAL SALPINGECTOMY Bilateral 03/16/2020   Procedure: LAPAROSCOPIC BILATERAL SALPINGECTOMY;  Surgeon: Florian Buff, MD;  Location: [facility];  Service: Gynecology;  Laterality: Bilateral;  . SHOULDER SURGERY Right 03/10/2019  . TONSILLECTOMY    . TUBAL LIGATION N/A    Phreesia 06/28/2020    Family History  Problem Relation Age of Onset  . Hypertension Mother   . Diabetes Mother   . Heart disease Mother   . Diabetes Father   . Cancer Paternal Aunt        breast  . Hypertension Maternal Grandmother   . Diabetes Son        pre diabetic  . Other Son        overactive bladder  .  COPD Maternal Aunt   . Leukemia Maternal Aunt   . Allergic rhinitis Neg Hx   . Angioedema Neg Hx   . Asthma Neg Hx   . Atopy Neg Hx   . Eczema Neg Hx   . Immunodeficiency Neg Hx   . Urticaria Neg Hx     Social History   Socioeconomic History  . Marital status: Legally Separated    Spouse name: Jameel  . Number of children: 1  . Years of education: Not on file  . Highest education level: Not on file  Occupational History  . Not on file  Tobacco Use  . Smoking status: Never Smoker  . Smokeless tobacco: Never Used  Vaping Use  . Vaping Use: Never used  Substance and  Sexual Activity  . Alcohol use: No  . Drug use: No  . Sexual activity: Yes    Birth control/protection: Surgical    Comment: tubal  Other Topics Concern  . Not on file  Social History Narrative  . Not on file   Social Determinants of Health   Financial Resource Strain: Low Risk   . Difficulty of Paying Living Expenses: Not hard at all  Food Insecurity: No Food Insecurity  . Worried About Charity fundraiser in the Last Year: Never true  . Ran Out of Food in the Last Year: Never true  Transportation Needs: No Transportation Needs  . Lack of Transportation (Medical): No  . Lack of Transportation (Non-Medical): No  Physical Activity: Insufficiently Active  . Days of Exercise per Week: 2 days  . Minutes of Exercise per Session: 10 min  Stress: No Stress Concern Present  . Feeling of Stress : Not at all  Social Connections: Moderately Integrated  . Frequency of Communication with Friends and Family: More than three times a week  . Frequency of Social Gatherings with Friends and Family: Twice a week  . Attends Religious Services: 1 to 4 times per year  . Active Member of Clubs or Organizations: No  . Attends Archivist Meetings: Never  . Marital Status: Married  Human resources officer Violence: Not At Risk  . Fear of Current or Ex-Partner: No  . Emotionally Abused: No  . Physically Abused: No  . Sexually Abused: No    Outpatient Medications Prior to Visit  Medication Sig Dispense Refill  . amLODipine (NORVASC) 5 MG tablet Take 1 tablet (5 mg total) by mouth daily. 90 tablet 1  . Blood Pressure Monitoring (BLOOD PRESSURE CUFF) MISC 1 Device by Does not apply route once a week. To check pressure 3-5 times weekly (Patient taking differently: 1 Device by Does not apply route See admin instructions. To check pressure 3-5 times weekly) 1 each 0  . flunisolide (NASALIDE) 25 MCG/ACT (0.025%) SOLN Place 2 sprays into the nose 2 (two) times daily. (Patient taking differently: Place 2  sprays into the nose daily as needed (allergies).) 25 mL 5  . megestrol (MEGACE) 40 MG tablet Take 2 daily 60 tablet 2  . albuterol (PROVENTIL HFA) 108 (90 Base) MCG/ACT inhaler Use 4 puffs every 4-6 hours as needed for cough or wheeze. (Patient taking differently: Inhale 4 puffs into the lungs every 6 (six) hours as needed for wheezing (cough).) 18 g 2  . hydrochlorothiazide (MICROZIDE) 12.5 MG capsule Take 1 capsule (12.5 mg total) by mouth daily. 30 capsule 3  . fluticasone (FLONASE) 50 MCG/ACT nasal spray Place 2 sprays into both nostrils daily. (Patient not taking: Reported on 09/30/2020) 16  g 6  . Prenatal Vit-DSS-Fe Cbn-FA (PRENATAL AD PO) Take 1 tablet by mouth daily.      No facility-administered medications prior to visit.    Allergies  Allergen Reactions  . Banana Rash  . Pineapple Rash    ROS Review of Systems  Constitutional: Negative for chills and fever.  HENT: Negative for congestion, sinus pressure, sinus pain and sore throat.   Eyes: Negative for pain and discharge.  Respiratory: Positive for wheezing. Negative for cough.   Cardiovascular: Negative for chest pain and palpitations.  Gastrointestinal: Negative for abdominal pain, constipation, diarrhea, nausea and vomiting.  Endocrine: Negative for polydipsia and polyuria.  Genitourinary: Negative for dysuria and hematuria.  Musculoskeletal: Positive for arthralgias (Right shoulder). Negative for neck pain and neck stiffness.       Leg pain  Skin: Negative for rash.  Neurological: Negative for dizziness and weakness.  Psychiatric/Behavioral: Negative for agitation and behavioral problems.      Objective:    Physical Exam Vitals reviewed.  Constitutional:      General: She is not in acute distress.    Appearance: She is not diaphoretic.  HENT:     Head: Normocephalic and atraumatic.     Nose: Nose normal. No congestion.     Mouth/Throat:     Mouth: Mucous membranes are moist.     Pharynx: No posterior  oropharyngeal erythema.  Eyes:     General: No scleral icterus.    Extraocular Movements: Extraocular movements intact.  Cardiovascular:     Rate and Rhythm: Normal rate and regular rhythm.     Pulses: Normal pulses.     Heart sounds: Normal heart sounds. No murmur heard.   Pulmonary:     Breath sounds: Normal breath sounds. No wheezing or rales.  Musculoskeletal:     Cervical back: Neck supple. No tenderness.     Right lower leg: No edema.     Left lower leg: No edema.  Skin:    General: Skin is warm.     Findings: No rash.  Neurological:     General: No focal deficit present.     Mental Status: She is alert and oriented to person, place, and time.     Sensory: No sensory deficit.     Motor: No weakness.  Psychiatric:        Mood and Affect: Mood normal.        Behavior: Behavior normal.     BP 139/87 (BP Location: Left Arm, Patient Position: Sitting, Cuff Size: Normal)   Pulse 93   Temp 98.5 F (36.9 C) (Oral)   Resp 18   Ht 5' 5" (1.651 m)   Wt 288 lb (130.6 kg)   SpO2 98%   BMI 47.93 kg/m  Wt Readings from Last 3 Encounters:  09/30/20 288 lb (130.6 kg)  08/17/20 296 lb 6.4 oz (134.4 kg)  07/01/20 291 lb (132 kg)     Health Maintenance Due  Topic Date Due  . Pneumococcal Vaccine 52-38 Years old (1 of 4 - PCV13) Never done  . URINE MICROALBUMIN  Never done    There are no preventive care reminders to display for this patient.  Lab Results  Component Value Date   TSH 1.160 09/28/2020   Lab Results  Component Value Date   WBC 10.6 09/28/2020   HGB 12.9 09/28/2020   HCT 38.9 09/28/2020   MCV 87 09/28/2020   PLT 362 09/28/2020   Lab Results  Component Value Date  NA 140 09/28/2020   K 3.2 (L) 09/28/2020   CO2 20 09/28/2020   GLUCOSE 142 (H) 09/28/2020   BUN 10 09/28/2020   CREATININE 0.83 09/28/2020   BILITOT 0.6 09/28/2020   ALKPHOS 99 09/28/2020   AST 10 09/28/2020   ALT 6 09/28/2020   PROT 6.7 09/28/2020   ALBUMIN 4.1 09/28/2020    CALCIUM 9.1 09/28/2020   ANIONGAP 8 03/15/2020   EGFR 93 09/28/2020   Lab Results  Component Value Date   CHOL 116 09/28/2020   Lab Results  Component Value Date   HDL 36 (L) 09/28/2020   Lab Results  Component Value Date   LDLCALC 62 09/28/2020   Lab Results  Component Value Date   TRIG 92 09/28/2020   Lab Results  Component Value Date   CHOLHDL 3.2 09/28/2020   Lab Results  Component Value Date   HGBA1C 5.4 09/28/2020      Assessment & Plan:   Problem List Items Addressed This Visit      Cardiovascular and Mediastinum   Hypertension    BP Readings from Last 1 Encounters:  09/30/20 139/87   Well-controlled with Amlodipine and HCTZ Ankle swelling improved with HCTZ now Counseled for compliance with the medications Advised DASH diet and moderate exercise/walking, at least 150 mins/week      Relevant Medications   hydrochlorothiazide (MICROZIDE) 12.5 MG capsule     Respiratory   Mild persistent asthma, uncomplicated    Frequent use of Albuterol Will add Advair as maintenance treatment      Relevant Medications   fluticasone-salmeterol (ADVAIR HFA) 115-21 MCG/ACT inhaler   albuterol (PROVENTIL HFA) 108 (90 Base) MCG/ACT inhaler     Other   Morbid obesity ([place])    Has been less active since delivery Plans to resume exercise DASH diet and moderate exercise/walking at least 150 mins/week      Hypokalemia    Lab Results  Component Value Date   K 3.2 (L) 09/28/2020   Could be contributing to intermittent leg cramps Started K-Dur 20 mEq QD for now Check BMP in the next visit      Relevant Medications   potassium chloride SA (KLOR-CON) 20 MEQ tablet   Vitamin D deficiency    Last vitamin D Lab Results  Component Value Date   VD25OH 12.5 (L) 09/28/2020   Started Vitamin D 50,000 IU once weekly      Relevant Medications   Vitamin D, Ergocalciferol, (DRISDOL) 1.25 MG (50000 UNIT) CAPS capsule    Other Visit Diagnoses    Acute cystitis  without hematuria    -  Primary UA reviewed, check urine culture Started Bactrim Advised to increase fluid intake   Relevant Medications   sulfamethoxazole-trimethoprim (BACTRIM DS) 800-160 MG tablet   Other Relevant Orders   POCT URINALYSIS DIP (CLINITEK) (Completed)   Urine Culture      Leg cramps Could be due to hypokalemia Some symptoms suggestive of restless legs syndrome - if persistent with normal K, will start Ropinirole  Meds ordered this encounter  Medications  . fluticasone-salmeterol (ADVAIR HFA) 115-21 MCG/ACT inhaler    Sig: Inhale 2 puffs into the lungs 2 (two) times daily.    Dispense:  1 each    Refill:  12  . potassium chloride SA (KLOR-CON) 20 MEQ tablet    Sig: Take 1 tablet (20 mEq total) by mouth daily.    Dispense:  30 tablet    Refill:  3  . sulfamethoxazole-trimethoprim (BACTRIM DS) 800-160 MG  tablet    Sig: Take 1 tablet by mouth 2 (two) times daily.    Dispense:  10 tablet    Refill:  0  . Vitamin D, Ergocalciferol, (DRISDOL) 1.25 MG (50000 UNIT) CAPS capsule    Sig: Take 1 capsule (50,000 Units total) by mouth every 7 (seven) days.    Dispense:  5 capsule    Refill:  5  . hydrochlorothiazide (MICROZIDE) 12.5 MG capsule    Sig: Take 1 capsule (12.5 mg total) by mouth daily.    Dispense:  90 capsule    Refill:  1  . albuterol (PROVENTIL HFA) 108 (90 Base) MCG/ACT inhaler    Sig: Inhale 2 puffs into the lungs every 6 (six) hours as needed for wheezing (cough).    Dispense:  8 g    Refill:  5    Follow-up: Return in about 4 months (around 01/30/2021) for HTN and asthma.    [name], MD

## 2020-09-30 NOTE — Assessment & Plan Note (Addendum)
Lab Results  Component Value Date   K 3.2 (L) 09/28/2020   Could be contributing to intermittent leg cramps Started K-Dur 20 mEq QD for now Check BMP in the next visit

## 2020-09-30 NOTE — Assessment & Plan Note (Signed)
Last vitamin D Lab Results  Component Value Date   VD25OH 12.5 (L) 09/28/2020   Started Vitamin D 50,000 IU once weekly

## 2020-09-30 NOTE — Assessment & Plan Note (Signed)
BP Readings from Last 1 Encounters:  09/30/20 139/87   Well-controlled with Amlodipine and HCTZ Ankle swelling improved with HCTZ now Counseled for compliance with the medications Advised DASH diet and moderate exercise/walking, at least 150 mins/week

## 2020-10-06 LAB — URINE CULTURE

## 2020-10-13 ENCOUNTER — Other Ambulatory Visit: Payer: Medicaid Other

## 2020-10-18 ENCOUNTER — Other Ambulatory Visit: Payer: Self-pay | Admitting: Adult Health

## 2020-10-28 ENCOUNTER — Other Ambulatory Visit: Payer: Self-pay | Admitting: Internal Medicine

## 2020-10-28 ENCOUNTER — Telehealth: Payer: BC Managed Care – PPO | Admitting: Physician Assistant

## 2020-10-28 DIAGNOSIS — R3989 Other symptoms and signs involving the genitourinary system: Secondary | ICD-10-CM | POA: Diagnosis not present

## 2020-10-28 DIAGNOSIS — N3000 Acute cystitis without hematuria: Secondary | ICD-10-CM

## 2020-10-28 MED ORDER — NITROFURANTOIN MONOHYD MACRO 100 MG PO CAPS: 100.0000 mg | ORAL_CAPSULE | Freq: Two times a day (BID) | ORAL | 0 refills | Status: DC

## 2020-10-28 NOTE — Progress Notes (Signed)

## 2020-11-07 ENCOUNTER — Other Ambulatory Visit: Payer: Medicaid Other

## 2020-11-22 ENCOUNTER — Other Ambulatory Visit: Payer: Self-pay

## 2020-11-22 MED ORDER — MEGESTROL ACETATE 40 MG PO TABS: ORAL_TABLET | ORAL | 0 refills | Status: DC

## 2020-12-04 ENCOUNTER — Telehealth: Payer: Medicaid Other | Admitting: Physician Assistant

## 2020-12-04 DIAGNOSIS — R399 Unspecified symptoms and signs involving the genitourinary system: Secondary | ICD-10-CM | POA: Diagnosis not present

## 2020-12-05 MED ORDER — CEPHALEXIN 500 MG PO CAPS: ORAL_CAPSULE | ORAL | 0 refills | Status: DC

## 2020-12-05 NOTE — Progress Notes (Signed)
E-Visit for Urinary Problems  We are sorry that you are not feeling well.  Here is how we plan to help!  Based on what you shared with me it looks like you most likely have a simple urinary tract infection.  A UTI (Urinary Tract Infection) is a bacterial infection of the bladder.  Most cases of urinary tract infections are simple to treat but a key part of your care is to encourage you to drink plenty of fluids and watch your symptoms carefully.  I have prescribed Keflex 500 mg twice a day for 7 days.  Your symptoms should gradually improve. Call us if the burning in your urine worsens, you develop worsening fever, back pain or pelvic pain or if your symptoms do not resolve after completing the antibiotic.  Urinary tract infections can be prevented by drinking plenty of water to keep your body hydrated.  Also be sure when you wipe, wipe from front to back and don't hold it in!  If possible, empty your bladder every 4 hours.  HOME CARE Drink plenty of fluids Compete the full course of the antibiotics even if the symptoms resolve Remember, when you need to go.go. Holding in your urine can increase the likelihood of getting a UTI! GET HELP RIGHT AWAY IF: You cannot urinate You get a high fever Worsening back pain occurs You see blood in your urine You feel sick to your stomach or throw up You feel like you are going to pass out  MAKE SURE YOU  Understand these instructions. Will watch your condition. Will get help right away if you are not doing well or get worse.   Thank you for choosing an e-visit.  Your e-visit answers were reviewed by a board certified advanced clinical practitioner to complete your personal care plan. Depending upon the condition, your plan could have included both over the counter or prescription medications.  Please review your pharmacy choice. Make sure the pharmacy is open so you can pick up prescription now. If there is a problem, you may contact your  provider through [employer] and have the prescription routed to another pharmacy.  Your safety is important to [place]. If you have drug allergies check your prescription carefully.   For the next 24 hours you can use MyChart to ask questions about today's visit, request a non-urgent call back, or ask for a work or school excuse. You will get an email in the next two days asking about your experience. I hope that your e-visit has been valuable and will speed your recovery.  Greater than 5 minutes, yet less than 10 minutes of time have been spent researching, coordinating, and implementing care for this patient today

## 2020-12-06 NOTE — Patient Instructions (Addendum)
1. Moderate persistent asthma, uncomplicated - Daily controller medication(s): Singulair '10mg'$  daily and increase Advair 115/21 mcg to 2 puffs twice a day with spacer to help prevent cough and wheeze - Prior to physical activity: Proventil 2 puffs 10-15 minutes before physical activity. - Rescue medications: Proventil 4 puffs every 4-6 hours as needed - Asthma control goals:  * Full participation in all desired activities (may need albuterol before activity) * Albuterol use two time or less a week on average (not counting use with activity) * Cough interfering with sleep two time or less a month * Oral steroids no more than once a year * No hospitalizations  2. Chronic rhinitis (trees, indoor molds, outdoor molds and cat) - Continue taking: flunisolide 1-2 sprays per nostril twice daly (try using this more consistently and Singulair (montelukast) '10mg'$  daily Start olopatadine 0.2 % eye drops using 1 drop each eye once a day as needed for itchy watery eyes - You can use an extra dose of the antihistamine, if needed, for breakthrough symptoms.  - Consider nasal saline rinses 1-2 times daily to remove allergens from the nasal cavities as well as help with mucous clearance (this is especially helpful to do before the nasal sprays are g iven) - Consider allergy shots as a means of long-term control, once the baby is born.  -Recommend getting the mold removed from your apartment due to your allergies and asthma  3. Adverse food reaction (banana and pineapple) - Avoid banana and pineapple. In case of an allergic reaction, give Benadryl 4 teaspoonfuls every 4 hours, and if life-threatening symptoms occur, inject with EpiPen 0.3 mg.  Please let us know if this treatment plan is not working well for you. Schedule a follow up appointment in 2 months or sooner if needed

## 2020-12-07 ENCOUNTER — Encounter: Payer: Self-pay | Admitting: Family

## 2020-12-07 ENCOUNTER — Ambulatory Visit: Payer: Medicaid Other | Admitting: Family

## 2020-12-07 ENCOUNTER — Other Ambulatory Visit: Payer: Self-pay

## 2020-12-07 VITALS — BP 128/80 | HR 75 | Temp 97.7°F | Resp 18 | Ht 65.0 in | Wt 281.2 lb

## 2020-12-07 DIAGNOSIS — J4540 Moderate persistent asthma, uncomplicated: Secondary | ICD-10-CM

## 2020-12-07 DIAGNOSIS — J4530 Mild persistent asthma, uncomplicated: Secondary | ICD-10-CM

## 2020-12-07 DIAGNOSIS — T781XXD Other adverse food reactions, not elsewhere classified, subsequent encounter: Secondary | ICD-10-CM

## 2020-12-07 DIAGNOSIS — J302 Other seasonal allergic rhinitis: Secondary | ICD-10-CM | POA: Diagnosis not present

## 2020-12-07 DIAGNOSIS — J3089 Other allergic rhinitis: Secondary | ICD-10-CM

## 2020-12-07 MED ORDER — OLOPATADINE HCL 0.2 % OP SOLN: OPHTHALMIC | 3 refills | Status: DC

## 2020-12-07 MED ORDER — EPINEPHRINE 0.3 MG/0.3ML IJ SOAJ: 0.3000 mg | INTRAMUSCULAR | 1 refills | Status: DC | PRN

## 2020-12-07 MED ORDER — ADVAIR HFA 115-21 MCG/ACT IN AERO: INHALATION_SPRAY | RESPIRATORY_TRACT | 5 refills | Status: DC

## 2020-12-07 NOTE — Progress Notes (Signed)
[place], [address] [place] [address] Dept: [phone]  FOLLOW UP NOTE  Patient ID: [name], female    DOB: 11-Sep-1980  Age: 40 y.o. MRN: [id number] Date of Office Visit: 12/07/2020  Assessment  Chief Complaint: Allergic Rhinitis  (Mold and mildew in apartment - headaches, sob, some choking/ cough when sleeping. Needs air circulation in order to breath ) and Asthma (Has been using the rescue inhaler a lot due to heat )  HPI [name] is a 40 year old female who presents today for follow-up of moderate persistent asthma, seasonal and perennial allergic rhinitis, and adverse food reaction.  She is he was last seen on Sep 18, 2019 by Dr. Ernst Bowler.  Moderate persistent asthma is reported as not well controlled with Singulair 10 mg once a day and she thinks Advair HFA.  She reports that she is no longer using Breo 200 mcg and that her primary care physician put her on a purple daily inhaler.  She uses the purple daily inhaler 2 puffs once a day.  Reports a little bit of tightness in her chest, nocturnal awakenings due to dry cough and shortness of breath.  She denies any wheezing.  She is using her albuterol inhaler approximately 3-4 times a week.  She has had 1 round of steroids since we last saw her.  Allergic rhinitis is reported as not well controlled with flunisolide nasal spray as needed, Allegra or Benadryl once a day, and Singulair 10 mg once a day.  She reports that she is out of olopatadine eyedrops.  She reports that since she moved into her new apartment in February that has mold she has been feeling sick.  She has had headaches, nasal congestion, postnasal drip, and itchy watery eyes.  She has tried cleaning up the mold herself, but reports that the administration of the apartment will not clean up unless she gets a letter from her doctor.  She continues to avoid banana and pineapple.  She reports that she no longer has an epinephrine  autoinjector device.   Drug Allergies:  Allergies  Allergen Reactions   Banana Rash   Pineapple Rash    Review of Systems: Review of Systems  Constitutional:  Negative for chills and fever.  HENT:         Reports post nasal drip and nasal congestion. Denies rhinorrhea.  Eyes:        Reports itchy watery eyes  Respiratory:  Positive for cough and shortness of breath. Negative for wheezing.        Reports nocturnal dry cough and shortness of breath. Also reports a little bit of tightness of chest. Denies wheeze  Cardiovascular:  Negative for chest pain and palpitations.  Gastrointestinal:        Denies heartburn and reflux  Genitourinary:  Negative for dysuria.  Skin:  Negative for itching and rash.  Neurological:  Positive for headaches.       Reports headaches since moving to a new apartment in February that has mold  Endo/Heme/Allergies:  Positive for environmental allergies.    Physical Exam: BP 128/80   Pulse 75   Temp 97.7 F (36.5 C)   Resp 18   Ht '5\' 5"'$  (1.651 m)   Wt 281 lb 3.2 oz (127.6 kg)   SpO2 99%   BMI 46.79 kg/m    Physical Exam  Diagnostics: FVC 2.94 L, FEV1 2.37 L.  Predicted FVC 3.41 L, predicted FEV1 2.82 L.  Spirometry indicates normal  respiratory function.  Assessment and Plan: 1. Not well controlled moderate persistent asthma   2. Seasonal and perennial allergic rhinitis   3. Adverse food reaction, subsequent encounter   4. Mild persistent asthma, uncomplicated     No orders of the defined types were placed in this encounter.   Patient Instructions  1. Moderate persistent asthma, uncomplicated - Daily controller medication(s): Singulair '10mg'$  daily and increase Advair 115/21 mcg to 2 puffs twice a day with spacer to help prevent cough and wheeze - Prior to physical activity: Proventil 2 puffs 10-15 minutes before physical activity. - Rescue medications: Proventil 4 puffs every 4-6 hours as needed - Asthma control goals:  * Full  participation in all desired activities (may need albuterol before activity) * Albuterol use two time or less a week on average (not counting use with activity) * Cough interfering with sleep two time or less a month * Oral steroids no more than once a year * No hospitalizations  2. Chronic rhinitis (trees, indoor molds, outdoor molds and cat) - Continue taking: flunisolide 1-2 sprays per nostril twice daly (try using this more consistently and Singulair (montelukast) '10mg'$  daily Start olopatadine 0.2 % eye drops using 1 drop each eye once a day as needed for itchy watery eyes - You can use an extra dose of the antihistamine, if needed, for breakthrough symptoms.  - Consider nasal saline rinses 1-2 times daily to remove allergens from the nasal cavities as well as help with mucous clearance (this is especially helpful to do before the nasal sprays are g iven) - Consider allergy shots as a means of long-term control, once the baby is born.  -Recommend getting the mold removed from your apartment due to your allergies and asthma  3. Adverse food reaction (banana and pineapple) - Avoid banana and pineapple. In case of an allergic reaction, give Benadryl 4 teaspoonfuls every 4 hours, and if life-threatening symptoms occur, inject with EpiPen 0.3 mg.  Please let us know if this treatment plan is not working well for you. Schedule a follow up appointment in 2 months or sooner if needed      Return in about 2 months (around 02/06/2021), or if symptoms worsen or fail to improve.    Thank you for the opportunity to care for this patient.  Please do not hesitate to contact me with questions.  Althea Charon, FNP [facility]

## 2020-12-29 ENCOUNTER — Other Ambulatory Visit: Payer: Self-pay

## 2021-01-01 ENCOUNTER — Other Ambulatory Visit: Payer: Self-pay

## 2021-01-01 ENCOUNTER — Ambulatory Visit
Admission: EM | Admit: 2021-01-01 | Discharge: 2021-01-01 | Disposition: A | Discharge status: Home / Self Care | Payer: BC Managed Care – PPO | Attending: Internal Medicine | Admitting: Internal Medicine

## 2021-01-01 ENCOUNTER — Encounter: Payer: Self-pay | Admitting: Emergency Medicine

## 2021-01-01 DIAGNOSIS — N200 Calculus of kidney: Secondary | ICD-10-CM | POA: Diagnosis not present

## 2021-01-01 DIAGNOSIS — N138 Other obstructive and reflux uropathy: Secondary | ICD-10-CM | POA: Insufficient documentation

## 2021-01-01 LAB — POCT URINALYSIS DIP (MANUAL ENTRY)
Glucose, UA: NEGATIVE mg/dL
Nitrite, UA: NEGATIVE
Protein Ur, POC: 100 mg/dL — AB
Spec Grav, UA: 1.015 (ref 1.010–1.025)
Urobilinogen, UA: 1 E.U./dL
pH, UA: 7 (ref 5.0–8.0)

## 2021-01-01 MED ORDER — CIPROFLOXACIN HCL 500 MG PO TABS: 500.0000 mg | ORAL_TABLET | Freq: Two times a day (BID) | ORAL | 0 refills | Status: DC

## 2021-01-01 MED ORDER — TAMSULOSIN HCL 0.4 MG PO CAPS: 0.4000 mg | ORAL_CAPSULE | Freq: Every day | ORAL | 0 refills | Status: AC

## 2021-01-01 MED ORDER — IBUPROFEN 600 MG PO TABS: 600.0000 mg | ORAL_TABLET | Freq: Four times a day (QID) | ORAL | 0 refills | Status: DC | PRN

## 2021-01-01 NOTE — Discharge Instructions (Addendum)
Please take medications as prescribed Increase oral fluids intake Take medications as prescribed If you have worsening symptoms or persistent pain please return to urgent care to be reevaluated We will call you with recommendations if labs are abnormal.

## 2021-01-01 NOTE — ED Provider Notes (Signed)
[facility]    CSN: [id number] Arrival date & time: 01/01/21  1122      History   Chief Complaint No chief complaint on file.   HPI [name] is a 40 y.o. female comes to the urgent care with a 2-day history of right flank pain and change in urine odor.  Patient says symptoms started 2 days ago and has been persistent.  Pain is currently 9 out of 10 in the right flank with no radiation to the groin.  No dysuria urgency or frequency.  Patient denies a history of kidney stones.  No nausea or vomiting.  No fever or chills.  No known relieving factors.  Movement aggravates the pain sometimes.  HPI  Past Medical History:  Diagnosis Date   Allergy    banana/pineapple   Anemia    post pregnancy   Anxiety    Asthma    Autoimmune disease, not elsewhere classified(279.49) 2013   Non specific- [facility], abnormal Bone Marrow signal   Breast lump in female 11/28/2012   Has tender round mass at 10-11 0'clock right breast 4 finger breaths from [place] will US   Breast mass, right 08/18/2012   Chlamydia    Chronic breast pain 05/31/2015   Dysmenorrhea 03/23/2014   Dyspareunia 01/05/2014   Fibroids, intramural 08/24/2015   GERD (gastroesophageal reflux disease)    Gonorrhea    Headache(784.0)    History of abnormal cervical Pap smear 04/02/2014   Hypertension    LLQ abdominal tenderness 05/31/2015   Menorrhagia 01/05/2014   Neuromuscular disorder ([place])    lower back and bilat leg pain   Obesity    Other and unspecified ovarian cyst 01/12/2014   Polyclonal gammopathy 07/26/2013   Insignificant   Recurrent upper respiratory infection (URI)    Scoliosis    Urticaria    Vaginal irritation 09/30/2014   Vaginal Pap smear, abnormal     Patient Active Problem List   Diagnosis Date Noted   Hypokalemia 09/30/2020   Vitamin D deficiency 09/30/2020   Menorrhagia with irregular cycle 08/17/2020   Chronic right shoulder pain 07/01/2020   Mild persistent asthma,  uncomplicated [id number]   Intramural leiomyoma of uterus 08/24/2015   Polyclonal gammopathy 07/26/2013   Dysplasia of cervix, unspecified 03/31/2013   Hypertension 09/21/2010   Morbid obesity ([place]) 11/03/2007    Past Surgical History:  Procedure Laterality Date   ADENOIDECTOMY     DILATION AND CURETTAGE OF UTERUS     LAPAROSCOPIC BILATERAL SALPINGECTOMY Bilateral 03/16/2020   Procedure: LAPAROSCOPIC BILATERAL SALPINGECTOMY;  Surgeon: Florian Buff, MD;  Location: [facility];  Service: Gynecology;  Laterality: Bilateral;   SHOULDER SURGERY Right 03/10/2019   TONSILLECTOMY     TUBAL LIGATION N/A    Phreesia 06/28/2020    OB History     Gravida  2   Para  2   Term  2   Preterm      AB  0   Living  2      SAB  0   IAB      Ectopic      Multiple  0   Live Births  2            Home Medications    Prior to Admission medications   Medication Sig Start Date End Date Taking? Authorizing Provider  ciprofloxacin (CIPRO) 500 MG tablet Take 1 tablet (500 mg total) by mouth every 12 (twelve) hours. 01/01/21  Yes Lebron Nauert, Myrene Galas, MD  ibuprofen (ADVIL) 600 MG tablet Take 1 tablet (600 mg total) by mouth every 6 (six) hours as needed. 01/01/21  Yes Araiyah Cumpton, Myrene Galas, MD  tamsulosin (FLOMAX) 0.4 MG CAPS capsule Take 1 capsule (0.4 mg total) by mouth daily for 10 days. 01/01/21 01/11/21 Yes Glorya Bartley, Myrene Galas, MD  albuterol (PROVENTIL HFA) 108 (90 Base) MCG/ACT inhaler Inhale 2 puffs into the lungs every 6 (six) hours as needed for wheezing (cough). 09/30/20   [name], MD  amLODipine (NORVASC) 5 MG tablet Take 1 tablet (5 mg total) by mouth daily. 07/01/20   [name], MD  Blood Pressure Monitoring (BLOOD PRESSURE CUFF) MISC 1 Device by Does not apply route once a week. To check pressure 3-5 times weekly Patient taking differently: 1 Device by Does not apply route See admin instructions. To check pressure 3-5 times weekly 05/13/19   Jonnie Kind, MD  EPINEPHrine 0.3  mg/0.3 mL IJ SOAJ injection Inject 0.3 mg into the muscle as needed for anaphylaxis. 12/07/20   Althea Charon, FNP  flunisolide (NASALIDE) 25 MCG/ACT (0.025%) SOLN Place 2 sprays into the nose 2 (two) times daily. Patient taking differently: Place 2 sprays into the nose daily as needed (allergies). 05/22/19   Valentina Shaggy, MD  fluticasone-salmeterol (ADVAIR [facility]) [phone] MCG/ACT inhaler Inhale 2 puffs into the lungs 2 (two) times daily. 09/30/20   [name], MD  fluticasone-salmeterol (ADVAIR [facility]) [phone] MCG/ACT inhaler Inhale 2 puffs twice a day with spacer to help prevent cough and wheeze 12/07/20   Althea Charon, FNP  hydrochlorothiazide (MICROZIDE) 12.5 MG capsule Take 1 capsule (12.5 mg total) by mouth daily. 09/30/20   [name], MD  megestrol (MEGACE) 40 MG tablet TAKE 2 TABLETS DAILY. 11/22/20   Estill Dooms, NP  Olopatadine HCl 0.2 % SOLN Use 1 drop in each eye once a day as needed for itchy watery eyes 12/07/20   Althea Charon, FNP  zolpidem (AMBIEN) 10 MG tablet TAKE (1) TABLET BY MOUTH AT BEDTIME. Patient not taking: No sig reported 02/28/16 11/12/18  Alycia Rossetti, MD    Family History Family History  Problem Relation Age of Onset   Hypertension Mother    Diabetes Mother    Heart disease Mother    Diabetes Father    Cancer Paternal Aunt        breast   Hypertension Maternal Grandmother    Diabetes Son        pre diabetic   Other Son        overactive bladder   COPD Maternal Aunt    Leukemia Maternal Aunt    Allergic rhinitis Neg Hx    Angioedema Neg Hx    Asthma Neg Hx    Atopy Neg Hx    Eczema Neg Hx    Immunodeficiency Neg Hx    Urticaria Neg Hx     Social History Social History   Tobacco Use   Smoking status: Never   Smokeless tobacco: Never  Vaping Use   Vaping Use: Never used  Substance Use Topics   Alcohol use: No   Drug use: No     Allergies   Banana and Pineapple   Review of Systems Review of Systems   Gastrointestinal: Negative.   Genitourinary:  Positive for flank pain. Negative for dysuria, genital sores, hematuria and urgency.  Neurological: Negative.     Physical Exam Triage Vital Signs ED Triage Vitals  Enc Vitals Group     BP  01/01/21 1138 128/90     Pulse Rate 01/01/21 1138 97     Resp 01/01/21 1138 16     Temp 01/01/21 1138 98.8 F (37.1 C)     Temp Source 01/01/21 1138 Oral     SpO2 01/01/21 1138 98 %     Weight --      Height --      Head Circumference --      Peak Flow --      Pain Score 01/01/21 1140 9     Pain Loc --      Pain Edu? --      Excl. in [place]? --    No data found.  Updated Vital Signs BP 128/90 (BP Location: Right Arm)   Pulse 97   Temp 98.8 F (37.1 C) (Oral)   Resp 16   SpO2 98%   Visual Acuity Right Eye Distance:   Left Eye Distance:   Bilateral Distance:    Right Eye Near:   Left Eye Near:    Bilateral Near:     Physical Exam Vitals and nursing note reviewed.  Constitutional:      General: She is not in acute distress.    Appearance: She is not ill-appearing.  Cardiovascular:     Rate and Rhythm: Normal rate and regular rhythm.  Pulmonary:     Effort: Pulmonary effort is normal.     Breath sounds: Normal breath sounds.  Abdominal:     Tenderness: There is right CVA tenderness.     Comments: Right CVA tenderness.  Neurological:     Mental Status: She is alert.     UC Treatments / Results  Labs (all labs ordered are listed, but only abnormal results are displayed) Labs Reviewed  POCT URINALYSIS DIP (MANUAL ENTRY) - Abnormal; Notable for the following components:      Result Value   Bilirubin, UA small (*)    Ketones, POC UA trace (5) (*)    Blood, UA large (*)    Protein Ur, POC =100 (*)    Leukocytes, UA Large (3+) (*)    All other components within normal limits  URINE CULTURE    EKG   Radiology No results found.  Procedures Procedures (including critical care time)  Medications Ordered in  UC Medications - No data to display  Initial Impression / Assessment and Plan / UC Course  I have reviewed the triage vital signs and the nursing notes.  Pertinent labs & imaging results that were available during my care of the patient were reviewed by me and considered in my medical decision making (see chart for details).     1.  Right kidney stone Point-of-care urinalysis is positive for leukocytes, hemoglobin and negative for nitrite. Urine cultures have been sent Ciprofloxacin 500 mg twice daily for 5 days Ibuprofen 600 mg every 6 hours as needed for pain Tamsulosin 0.4 mg daily Return precautions given Plan is to discontinue antibiotic if urine cultures are negative. Final Clinical Impressions(s) / UC Diagnoses   Final diagnoses:  Urinary tract obstruction by kidney stone  Kidney stone on right side     Discharge Instructions      Please take medications as prescribed Increase oral fluids intake Take medications as prescribed If you have worsening symptoms or persistent pain please return to urgent care to be reevaluated We will call you with recommendations if labs are abnormal.   ED Prescriptions     Medication Sig Dispense Auth. Provider  tamsulosin (FLOMAX) 0.4 MG CAPS capsule Take 1 capsule (0.4 mg total) by mouth daily for 10 days. 10 capsule Bradely Rudin, Myrene Galas, MD   ciprofloxacin (CIPRO) 500 MG tablet Take 1 tablet (500 mg total) by mouth every 12 (twelve) hours. 10 tablet Dayzha Pogosyan, Myrene Galas, MD   ibuprofen (ADVIL) 600 MG tablet Take 1 tablet (600 mg total) by mouth every 6 (six) hours as needed. 30 tablet Taija Mathias, Myrene Galas, MD      PDMP not reviewed this encounter.   Chase Picket, MD 01/01/21 [phone]

## 2021-01-01 NOTE — ED Triage Notes (Signed)
Right side pain that radiates to back x 2 days. States pain is worse with movement.   Slight smell to urine since last week

## 2021-01-03 LAB — URINE CULTURE: Culture: >=100000 — AB

## 2021-01-04 ENCOUNTER — Other Ambulatory Visit: Payer: Self-pay | Admitting: Internal Medicine

## 2021-01-04 DIAGNOSIS — I10 Essential (primary) hypertension: Secondary | ICD-10-CM

## 2021-01-12 ENCOUNTER — Other Ambulatory Visit: Payer: Self-pay | Admitting: Adult Health

## 2021-01-12 MED ORDER — MEGESTROL ACETATE 40 MG PO TABS: ORAL_TABLET | ORAL | 0 refills | Status: DC

## 2021-01-12 NOTE — Progress Notes (Signed)
Refill megace  

## 2021-01-23 ENCOUNTER — Telehealth: Payer: BC Managed Care – PPO | Admitting: Physician Assistant

## 2021-01-23 DIAGNOSIS — R3989 Other symptoms and signs involving the genitourinary system: Secondary | ICD-10-CM | POA: Diagnosis not present

## 2021-01-23 MED ORDER — SULFAMETHOXAZOLE-TRIMETHOPRIM 800-160 MG PO TABS: 1.0000 | ORAL_TABLET | Freq: Two times a day (BID) | ORAL | 0 refills | Status: DC

## 2021-01-23 NOTE — Progress Notes (Signed)
E-Visit for Urinary Problems  We are sorry that you are not feeling well.  Here is how we plan to help!  Based on what you shared with me it looks like you most likely have a simple urinary tract infection.  A UTI (Urinary Tract Infection) is a bacterial infection of the bladder.  Most cases of urinary tract infections are simple to treat but a key part of your care is to encourage you to drink plenty of fluids and watch your symptoms carefully.  I have prescribed Bactrim DS One tablet twice a day for 5 days.  Your symptoms should gradually improve. Call us if the burning in your urine worsens, you develop worsening fever, back pain or pelvic pain or if your symptoms do not resolve after completing the antibiotic.  Urinary tract infections can be prevented by drinking plenty of water to keep your body hydrated.  Also be sure when you wipe, wipe from front to back and don't hold it in!  If possible, empty your bladder every 4 hours.  HOME CARE Drink plenty of fluids Compete the full course of the antibiotics even if the symptoms resolve Remember, when you need to go.go. Holding in your urine can increase the likelihood of getting a UTI! GET HELP RIGHT AWAY IF: You cannot urinate You get a high fever Worsening back pain occurs You see blood in your urine You feel sick to your stomach or throw up You feel like you are going to pass out  MAKE SURE YOU  Understand these instructions. Will watch your condition. Will get help right away if you are not doing well or get worse.   Thank you for choosing an e-visit.  Your e-visit answers were reviewed by a board certified advanced clinical practitioner to complete your personal care plan. Depending upon the condition, your plan could have included both over the counter or prescription medications.  Please review your pharmacy choice. Make sure the pharmacy is open so you can pick up prescription now. If there is a problem, you may contact  your provider through [employer] and have the prescription routed to another pharmacy.  Your safety is important to [place]. If you have drug allergies check your prescription carefully.   For the next 24 hours you can use MyChart to ask questions about today's visit, request a non-urgent call back, or ask for a work or school excuse. You will get an email in the next two days asking about your experience. I hope that your e-visit has been valuable and will speed your recovery.  I provided 6 minutes of non face-to-face time during this encounter for chart review and documentation.

## 2021-02-07 ENCOUNTER — Encounter: Payer: Self-pay | Admitting: Adult Health

## 2021-02-07 ENCOUNTER — Other Ambulatory Visit (HOSPITAL_COMMUNITY)
Admission: RE | Admit: 2021-02-07 | Discharge: 2021-02-07 | Disposition: A | Discharge status: Home / Self Care | Payer: BC Managed Care – PPO | Source: Ambulatory Visit | Attending: Adult Health | Admitting: Adult Health

## 2021-02-07 ENCOUNTER — Other Ambulatory Visit: Payer: Self-pay

## 2021-02-07 ENCOUNTER — Ambulatory Visit: Payer: BC Managed Care – PPO | Admitting: Adult Health

## 2021-02-07 VITALS — BP 124/86 | HR 89 | Ht 65.0 in | Wt 280.0 lb

## 2021-02-07 DIAGNOSIS — N6452 Nipple discharge: Secondary | ICD-10-CM

## 2021-02-07 DIAGNOSIS — N939 Abnormal uterine and vaginal bleeding, unspecified: Secondary | ICD-10-CM | POA: Diagnosis not present

## 2021-02-07 DIAGNOSIS — Z01419 Encounter for gynecological examination (general) (routine) without abnormal findings: Secondary | ICD-10-CM | POA: Insufficient documentation

## 2021-02-07 NOTE — Progress Notes (Signed)
Patient ID: [name], female   DOB: 11-20-1980, 40 y.o.   MRN: [id number] History of Present Illness: [name] is a 40 year old black female, separated, G2P2 in complaining of bleeding since 9/10, megace did not help and nipple discharge. Her last pap was 04/10/18. PCP is Dr Posey Pronto.  Current Medications, Allergies, Past Medical History, Past Surgical History, Family History and Social History were reviewed in [employer] record.     Review of Systems: Bleeding non stop since 01/07/21.megace no help this time  Has white milky nipple discharge Reviewed past medical,surgical, social and family history. Reviewed medications and allergies.    Physical Exam:BP 124/86 (BP Location: Left Arm, Patient Position: Sitting, Cuff Size: Large)   Pulse 89   Ht 5\' 5"  (1.651 m)   Wt 280 lb (127 kg)   LMP 01/07/2021 (Exact Date)   BMI 46.59 kg/m   General:  Well developed, well nourished, no acute distress Skin:  Warm and dry Neck:  Midline trachea, normal thyroid, good ROM, no lymphadenopathy Lungs; Clear to auscultation bilaterally Breast:  No dominant palpable mass, retraction,has white milky discharge left breast from 2 sites Cardiovascular: Regular rate and rhythm Abdomen:  Soft, non tender, no hepatosplenomegaly Pelvic:  External genitalia is normal in appearance, no lesions.  The vagina is normal in appearance. Urethra has no lesions or masses. The cervix is bulbous.Pap with HR HPV genotyping performed.  Uterus is felt to be normal size, shape, and contour.  No adnexal masses or tenderness noted.Bladder is non tender, no masses felt. Extremities/musculoskeletal:  No swelling or varicosities noted, no clubbing or cyanosis Psych:  No mood changes, alert and cooperative,seems happy  [place] - 02/07/21 1428       Pregnancy Intention Screening   Does the patient want to become pregnant in the next year? N/A    Does the patient's partner want to become pregnant in  the next year? N/A    Would the patient like to discuss contraceptive options today? N/A      Contraception Wrap Up   Current Method Female Sterilization    End Method Female Sterilization    Contraception Counseling Provided No            Examination chaperoned by [employer] RN  Impression and Plan: 1. Abnormal uterine bleeding (AUB) Will check CBC - CBC Given hand out on endometrial ablation Follow up with  02/22/21 for [place] and then about 3 weeks with Dr Elonda Husky to discuss ablation  2. Nipple discharge Will check labs, when breast not been manipulated  - TSH - Prolactin  3. Encounter for gynecological examination with Papanicolaou smear of cervix Pap sent Pap in 3 years if normal Physical next year with Dr Posey Pronto - Cytology - PAP

## 2021-02-08 DIAGNOSIS — N6452 Nipple discharge: Secondary | ICD-10-CM | POA: Diagnosis not present

## 2021-02-08 DIAGNOSIS — N939 Abnormal uterine and vaginal bleeding, unspecified: Secondary | ICD-10-CM | POA: Diagnosis not present

## 2021-02-09 LAB — CBC
Hematocrit: 39 % (ref 34.0–46.6)
Hemoglobin: 12.7 g/dL (ref 11.1–15.9)
MCH: 29.6 pg (ref 26.6–33.0)
MCHC: 32.6 g/dL (ref 31.5–35.7)
MCV: 91 fL (ref 79–97)
Platelets: 367 10*3/uL (ref 150–450)
RBC: 4.29 x10E6/uL (ref 3.77–5.28)
RDW: 12.9 % (ref 11.7–15.4)
WBC: 10.4 10*3/uL (ref 3.4–10.8)

## 2021-02-09 LAB — CYTOLOGY - PAP
Comment: NEGATIVE
Diagnosis: NEGATIVE
High risk HPV: NEGATIVE

## 2021-02-09 LAB — PROLACTIN: Prolactin: 54.5 ng/mL — ABNORMAL HIGH (ref 4.8–23.3)

## 2021-02-09 LAB — TSH: TSH: 2.08 u[IU]/mL (ref 0.450–4.500)

## 2021-02-10 ENCOUNTER — Other Ambulatory Visit: Payer: Self-pay

## 2021-02-10 ENCOUNTER — Telehealth: Payer: Self-pay | Admitting: Adult Health

## 2021-02-10 ENCOUNTER — Encounter: Payer: Self-pay | Admitting: Adult Health

## 2021-02-10 ENCOUNTER — Ambulatory Visit (INDEPENDENT_AMBULATORY_CARE_PROVIDER_SITE_OTHER): Payer: BC Managed Care – PPO | Admitting: Internal Medicine

## 2021-02-10 ENCOUNTER — Encounter: Payer: Self-pay | Admitting: Internal Medicine

## 2021-02-10 ENCOUNTER — Ambulatory Visit: Payer: BC Managed Care – PPO | Admitting: Allergy & Immunology

## 2021-02-10 VITALS — BP 136/84 | HR 86 | Temp 98.6°F | Resp 18 | Ht 65.0 in | Wt 277.1 lb

## 2021-02-10 DIAGNOSIS — I10 Essential (primary) hypertension: Secondary | ICD-10-CM

## 2021-02-10 DIAGNOSIS — Z23 Encounter for immunization: Secondary | ICD-10-CM | POA: Diagnosis not present

## 2021-02-10 DIAGNOSIS — E221 Hyperprolactinemia: Secondary | ICD-10-CM | POA: Diagnosis not present

## 2021-02-10 DIAGNOSIS — R7989 Other specified abnormal findings of blood chemistry: Secondary | ICD-10-CM

## 2021-02-10 DIAGNOSIS — G2581 Restless legs syndrome: Secondary | ICD-10-CM | POA: Insufficient documentation

## 2021-02-10 DIAGNOSIS — N939 Abnormal uterine and vaginal bleeding, unspecified: Secondary | ICD-10-CM | POA: Diagnosis not present

## 2021-02-10 DIAGNOSIS — N6452 Nipple discharge: Secondary | ICD-10-CM

## 2021-02-10 DIAGNOSIS — J4530 Mild persistent asthma, uncomplicated: Secondary | ICD-10-CM

## 2021-02-10 HISTORY — DX: Other specified abnormal findings of blood chemistry: R79.89

## 2021-02-10 NOTE — Assessment & Plan Note (Signed)
Unclear etiology currently - but advised to take iron supplements for now as she has chronic DUB Although Hb normal, can have subtle iron deficiency contributing to restless legs Advised to take Magnesium supplement If persistent, may try Ropinirole or Premipexole

## 2021-02-10 NOTE — Progress Notes (Signed)
Established Patient Office Visit  Subjective:  Patient ID: [name], female    DOB: November 12, 1980  Age: 40 y.o. MRN: [id number]  CC:  Chief Complaint  Patient presents with   Follow-up    4 month follow up HTN and asthma bp has been running good at home asthma is touch and go still looking for somewhere to live due to mold and mildew in current location     HPI [name]   is a 40 year old female with past medical history of hypertension, asthma, gestational diabetes and morbid obesity who presents for follow up of her chronic medical conditions and blood tests review.   HTN: BP is well-controlled. Takes medications regularly. Patient denies headache, dizziness, chest pain, dyspnea or palpitations.  Asthma: She had been having frequent spells of dyspnea and wheezing, and has been using Albuterol frequently, almost everyday since moving to her new apartment. She reports still dealing with molds.  She has been feeling better with Advair. denies any nighttime awakening currently.  She has seen OB/GYN for abnormal uterine bleeding and breast discharge.  Her last blood test show elevated prolactin level.  She is advised to contact OB/GYN office for follow-up.  I discussed further steps in detail with her today.  She has been having leg cramps, especially at nighttime, which gets somewhat better with walking.  She agrees to start taking iron and magnesium supplements for now.  She received flu vaccine in the office today.  Past Medical History:  Diagnosis Date   Allergy    banana/pineapple   Anemia    post pregnancy   Anxiety    Asthma    Autoimmune disease, not elsewhere classified(279.49) 2013   Non specific- [facility], abnormal Bone Marrow signal   Breast lump in female 11/28/2012   Has tender round mass at 10-11 0'clock right breast 4 finger breaths from [facility] will US   Breast mass, right 08/18/2012   Chlamydia    Chronic breast pain 05/31/2015    Dysmenorrhea 03/23/2014   Dyspareunia 01/05/2014   Fibroids, intramural 08/24/2015   GERD (gastroesophageal reflux disease)    Gonorrhea    Headache(784.0)    History of abnormal cervical Pap smear 04/02/2014   Hypertension    LLQ abdominal tenderness 05/31/2015   Menorrhagia 01/05/2014   Neuromuscular disorder ([place])    lower back and bilat leg pain   Obesity    Other and unspecified ovarian cyst 01/12/2014   Polyclonal gammopathy 07/26/2013   Insignificant   Recurrent upper respiratory infection (URI)    Scoliosis    Urticaria    Vaginal irritation 09/30/2014   Vaginal Pap smear, abnormal     Past Surgical History:  Procedure Laterality Date   ADENOIDECTOMY     DILATION AND CURETTAGE OF UTERUS     LAPAROSCOPIC BILATERAL SALPINGECTOMY Bilateral 03/16/2020   Procedure: LAPAROSCOPIC BILATERAL SALPINGECTOMY;  Surgeon: Florian Buff, MD;  Location: [facility];  Service: Gynecology;  Laterality: Bilateral;   SHOULDER SURGERY Right 03/10/2019   TONSILLECTOMY     TUBAL LIGATION N/A    Phreesia 06/28/2020    Family History  Problem Relation Age of Onset   Hypertension Mother    Diabetes Mother    Heart disease Mother    Diabetes Father    Cancer Paternal Aunt        breast   Hypertension Maternal Grandmother    Diabetes Son        pre diabetic   Other Son  overactive bladder   COPD Maternal Aunt    Leukemia Maternal Aunt    Allergic rhinitis Neg Hx    Angioedema Neg Hx    Asthma Neg Hx    Atopy Neg Hx    Eczema Neg Hx    Immunodeficiency Neg Hx    Urticaria Neg Hx     Social History   Socioeconomic History   Marital status: Legally Separated    Spouse name: Jameel   Number of children: 2   Years of education: Not on file   Highest education level: Not on file  Occupational History   Not on file  Tobacco Use   Smoking status: Never   Smokeless tobacco: Never  Vaping Use   Vaping Use: Never used  Substance and Sexual Activity   Alcohol use: No   Drug use: No    Sexual activity: Not Currently    Birth control/protection: Surgical    Comment: tubal  Other Topics Concern   Not on file  Social History Narrative   Not on file   Social Determinants of Health   Financial Resource Strain: Low Risk    Difficulty of Paying Living Expenses: Not hard at all  Food Insecurity: No Food Insecurity   Worried About Charity fundraiser in the Last Year: Never true   [place] in the Last Year: Never true  Transportation Needs: No Transportation Needs   Lack of Transportation (Medical): No   Lack of Transportation (Non-Medical): No  Physical Activity: Insufficiently Active   Days of Exercise per Week: 2 days   Minutes of Exercise per Session: 10 min  Stress: No Stress Concern Present   Feeling of Stress : Not at all  Social Connections: Moderately Integrated   Frequency of Communication with Friends and Family: More than three times a week   Frequency of Social Gatherings with Friends and Family: Twice a week   Attends Religious Services: 1 to 4 times per year   Active Member of [employer] or Organizations: No   Attends Music therapist: Never   Marital Status: Married  Human resources officer Violence: Not At Risk   Fear of Current or Ex-Partner: No   Emotionally Abused: No   Physically Abused: No   Sexually Abused: No    Outpatient Medications Prior to Visit  Medication Sig Dispense Refill   albuterol (PROVENTIL HFA) 108 (90 Base) MCG/ACT inhaler Inhale 2 puffs into the lungs every 6 (six) hours as needed for wheezing (cough). 8 g 5   amLODipine (NORVASC) 5 MG tablet TAKE 1 TABLET BY MOUTH ONCE A DAY. 90 tablet 0   Blood Pressure Monitoring (BLOOD PRESSURE CUFF) MISC 1 Device by Does not apply route once a week. To check pressure 3-5 times weekly (Patient taking differently: 1 Device by Does not apply route See admin instructions. To check pressure 3-5 times weekly) 1 each 0   ciprofloxacin (CIPRO) 500 MG tablet Take 1 tablet (500 mg  total) by mouth every 12 (twelve) hours. 10 tablet 0   EPINEPHrine 0.3 mg/0.3 mL IJ SOAJ injection Inject 0.3 mg into the muscle as needed for anaphylaxis. 1 each 1   flunisolide (NASALIDE) 25 MCG/ACT (0.025%) SOLN Place 2 sprays into the nose 2 (two) times daily. (Patient taking differently: Place 2 sprays into the nose daily as needed (allergies).) 25 mL 5   fluticasone-salmeterol (ADVAIR HFA) 115-21 MCG/ACT inhaler Inhale 2 puffs into the lungs 2 (two) times daily. 1 each 12  fluticasone-salmeterol (ADVAIR HFA) 115-21 MCG/ACT inhaler Inhale 2 puffs twice a day with spacer to help prevent cough and wheeze 1 each 5   hydrochlorothiazide (MICROZIDE) 12.5 MG capsule Take 1 capsule (12.5 mg total) by mouth daily. 90 capsule 1   ibuprofen (ADVIL) 600 MG tablet Take 1 tablet (600 mg total) by mouth every 6 (six) hours as needed. 30 tablet 0   megestrol (MEGACE) 40 MG tablet TAKE 2 TABLETS DAILY. 60 tablet 0   Olopatadine HCl 0.2 % SOLN Use 1 drop in each eye once a day as needed for itchy watery eyes 2.5 mL 3   sulfamethoxazole-trimethoprim (BACTRIM DS) 800-160 MG tablet Take 1 tablet by mouth 2 (two) times daily. 10 tablet 0   No facility-administered medications prior to visit.    Allergies  Allergen Reactions   Banana Rash   Pineapple Rash    ROS Review of Systems  Constitutional:  Negative for chills and fever.  HENT:  Negative for congestion, sinus pressure, sinus pain and sore throat.   Eyes:  Negative for pain and discharge.  Respiratory:  Negative for cough and wheezing.   Cardiovascular:  Negative for chest pain and palpitations.  Gastrointestinal:  Negative for abdominal pain, constipation, diarrhea, nausea and vomiting.  Endocrine: Negative for polydipsia and polyuria.  Genitourinary:  Positive for vaginal bleeding. Negative for dysuria and hematuria.  Musculoskeletal:  Negative for neck pain and neck stiffness.       Leg pain  Skin:  Negative for rash.  Neurological:   Negative for dizziness and weakness.  Psychiatric/Behavioral:  Negative for agitation and behavioral problems.      Objective:    Physical Exam Vitals reviewed.  Constitutional:      General: She is not in acute distress.    Appearance: She is not diaphoretic.  HENT:     Head: Normocephalic and atraumatic.     Nose: Nose normal. No congestion.     Mouth/Throat:     Mouth: Mucous membranes are moist.     Pharynx: No posterior oropharyngeal erythema.  Eyes:     General: No scleral icterus.    Extraocular Movements: Extraocular movements intact.  Cardiovascular:     Rate and Rhythm: Normal rate and regular rhythm.     Pulses: Normal pulses.     Heart sounds: Normal heart sounds. No murmur heard. Pulmonary:     Breath sounds: Normal breath sounds. No wheezing or rales.  Musculoskeletal:     Cervical back: Neck supple. No tenderness.     Right lower leg: No edema.     Left lower leg: No edema.  Skin:    General: Skin is warm.     Findings: No rash.  Neurological:     General: No focal deficit present.     Mental Status: She is alert and oriented to person, place, and time.     Sensory: No sensory deficit.     Motor: No weakness.  Psychiatric:        Mood and Affect: Mood normal.        Behavior: Behavior normal.    BP 136/84 (BP Location: Left Arm, Patient Position: Sitting, Cuff Size: Normal)   Pulse 86   Temp 98.6 F (37 C) (Oral)   Resp 18   Ht 5' 5"  (1.651 m)   Wt 277 lb 1.9 oz (125.7 kg)   SpO2 99%   BMI 46.12 kg/m  Wt Readings from Last 3 Encounters:  02/10/21 277 lb 1.9 oz (125.7  kg)  02/07/21 280 lb (127 kg)  12/07/20 281 lb 3.2 oz (127.6 kg)     Health Maintenance Due  Topic Date Due   COVID-19 Vaccine (3 - Moderna risk series) 08/01/2020    There are no preventive care reminders to display for this patient.  Lab Results  Component Value Date   TSH 2.080 02/08/2021   Lab Results  Component Value Date   WBC 10.4 02/08/2021   HGB 12.7  02/08/2021   HCT 39.0 02/08/2021   MCV 91 02/08/2021   PLT 367 02/08/2021   Lab Results  Component Value Date   NA 140 09/28/2020   K 3.2 (L) 09/28/2020   CO2 20 09/28/2020   GLUCOSE 142 (H) 09/28/2020   BUN 10 09/28/2020   CREATININE 0.83 09/28/2020   BILITOT 0.6 09/28/2020   ALKPHOS 99 09/28/2020   AST 10 09/28/2020   ALT 6 09/28/2020   PROT 6.7 09/28/2020   ALBUMIN 4.1 09/28/2020   CALCIUM 9.1 09/28/2020   ANIONGAP 8 03/15/2020   EGFR 93 09/28/2020   Lab Results  Component Value Date   CHOL 116 09/28/2020   Lab Results  Component Value Date   HDL 36 (L) 09/28/2020   Lab Results  Component Value Date   LDLCALC 62 09/28/2020   Lab Results  Component Value Date   TRIG 92 09/28/2020   Lab Results  Component Value Date   CHOLHDL 3.2 09/28/2020   Lab Results  Component Value Date   HGBA1C 5.4 09/28/2020      Assessment & Plan:   Problem List Items Addressed This Visit       Cardiovascular and Mediastinum   Hypertension - Primary    BP Readings from Last 1 Encounters:  02/10/21 136/84  Well-controlled with Amlodipine and HCTZ Ankle swelling improved with HCTZ now Counseled for compliance with the medications Advised DASH diet and moderate exercise/walking, at least 150 mins/week        Respiratory   Mild persistent asthma, uncomplicated    Better controlled with Advair and PRN Albuterol now Needs to find a new place with less allergen exposure        Endocrine   Hyperprolactinemia ([place])    Has breast discharge chronically, went to Ob/Gyn recently Prolactin level was high Advised to contact Ob/Gyn office for it - needs repeat prolactin check and if elevated, may need MRI brain and Oncology referral        Genitourinary   Abnormal uterine bleeding (AUB)    On Megace, followed by Ob/Gyn Hb stable      Relevant Orders   CBC with Differential/Platelet   Fe+TIBC+Fer     Other   Restless legs    Unclear etiology currently - but advised  to take iron supplements for now as she has chronic DUB Although Hb normal, can have subtle iron deficiency contributing to restless legs Advised to take Magnesium supplement If persistent, may try Ropinirole or Premipexole      Other Visit Diagnoses     Need for immunization against influenza       Relevant Orders   Flu Vaccine QUAD 41moIM (Fluarix, Fluzone & Alfiuria Quad PF) (Completed)       No orders of the defined types were placed in this encounter.   Follow-up: Return in about 4 months (around 06/13/2021) for HTN and restless legs syndrome.    RLindell Spar MD

## 2021-02-10 NOTE — Assessment & Plan Note (Signed)
BP Readings from Last 1 Encounters:  02/10/21 136/84   Well-controlled with Amlodipine and HCTZ Ankle swelling improved with HCTZ now Counseled for compliance with the medications Advised DASH diet and moderate exercise/walking, at least 150 mins/week

## 2021-02-10 NOTE — Assessment & Plan Note (Signed)
Has breast discharge chronically, went to Ob/Gyn recently Prolactin level was high Advised to contact Ob/Gyn office for it - needs repeat prolactin check and if elevated, may need MRI brain and Oncology referral

## 2021-02-10 NOTE — Patient Instructions (Signed)
Please start taking iron supplement - 325 mg once daily and Magnesium 200 mg once daily.  Please call Ob/Gyn clinic about elevated Prolactin level. Please contact us if you need any further assistance.  Continue to take other medications as prescribed.

## 2021-02-10 NOTE — Telephone Encounter (Signed)
Pt aware of pap and labs, prolactin elevated at 54.5 will recheck in 8 weeks and will get diagnostic mammogram and [place] at [facility].03/07/21 at 2:40 pm

## 2021-02-10 NOTE — Assessment & Plan Note (Signed)
On Megace, followed by Ob/Gyn Hb stable

## 2021-02-10 NOTE — Assessment & Plan Note (Signed)
Better controlled with Advair and PRN Albuterol now Needs to find a new place with less allergen exposure

## 2021-02-11 ENCOUNTER — Other Ambulatory Visit: Payer: Self-pay | Admitting: Internal Medicine

## 2021-02-11 DIAGNOSIS — I10 Essential (primary) hypertension: Secondary | ICD-10-CM

## 2021-02-22 ENCOUNTER — Other Ambulatory Visit: Payer: Self-pay

## 2021-02-22 ENCOUNTER — Ambulatory Visit (INDEPENDENT_AMBULATORY_CARE_PROVIDER_SITE_OTHER): Payer: BC Managed Care – PPO

## 2021-02-22 DIAGNOSIS — N921 Excessive and frequent menstruation with irregular cycle: Secondary | ICD-10-CM

## 2021-02-22 NOTE — Progress Notes (Signed)
PELVIC US TA/TV: Heterogenous anteverted uterus,posterior intramural fibroid 2.1 x 1.2 x 1.1 cm,EEC 6.5 mm,normal ovaries,ovaries appear mobile,no pain during ultrasound,no free fluid

## 2021-02-24 ENCOUNTER — Ambulatory Visit: Payer: BC Managed Care – PPO | Admitting: Allergy & Immunology

## 2021-02-28 ENCOUNTER — Encounter: Payer: Self-pay | Admitting: Obstetrics & Gynecology

## 2021-02-28 ENCOUNTER — Other Ambulatory Visit: Payer: Self-pay

## 2021-02-28 ENCOUNTER — Ambulatory Visit: Payer: Medicaid Other | Admitting: Obstetrics & Gynecology

## 2021-02-28 VITALS — BP 123/81 | HR 107 | Ht 65.0 in | Wt 280.0 lb

## 2021-02-28 DIAGNOSIS — N921 Excessive and frequent menstruation with irregular cycle: Secondary | ICD-10-CM | POA: Diagnosis not present

## 2021-02-28 HISTORY — PX: BREAST BIOPSY: SHX20

## 2021-02-28 NOTE — Progress Notes (Signed)
Follow up appointment for results  Chief Complaint  Patient presents with   discuss getting an ablation    Blood pressure 123/81, pulse (!) 107, height 5\' 5"  (1.651 m), weight 280 lb (127 kg), last menstrual period 02/07/2021, not currently breastfeeding.     GYNECOLOGIC SONOGRAM     [name] is a 40 y.o. [id number] LMP 01/19/2021 She is here for a pelvic sonogram for menorrhagia.   Uterus                      6.1 x 4.3 x 5.9 cm, Total uterine volume 82 cc, heterogenous anteverted uterus,posterior intramural fibroid 2.1 x 1.2 x 1.1 cm   Endometrium          6.5 mm, symmetrical, wnl   Right ovary             2.7 x 1.1 x 2.5 cm, wnl   Left ovary                2.2 x 2.6 x 1.4 cm, wnl   No free fluid    Technician Comments:     PELVIC US TA/TV: Heterogenous anteverted uterus,posterior intramural fibroid 2.1 x 1.2 x 1.1 cm,EEC 6.5 mm,normal ovaries,ovaries appear mobile,no pain during ultrasound,no free fluid    [employer] 02/22/2021 12:43 PM   Clinical Impression and recommendations:   I have reviewed the sonogram results above, combined with the patient's current clinical course, below are my impressions and any appropriate recommendations for management based on the sonographic findings.   Uterus is normal size shape and contour Endometrium is normal Both ovaries are normal     Florian Buff 02/28/2021 3:11 PM    Normal sonogram with poor megestrol response, discussed with patient desires to proceed with endometrial ablation   MEDS ordered this encounter: No orders of the defined types were placed in this encounter.   Orders for this encounter: No orders of the defined types were placed in this encounter.   Impression:   ICD-10-CM   1. Menorrhagia with irregular cycle  N92.1    unresponsive to megesrol therapy       Plan: Proceed with hysteroscopy uterine curettage Minerva ablation 03/14/22     All questions were answered.  Past  Medical History:  Diagnosis Date   AKI (acute kidney injury) ([place]) 08/17/2021   Allergy    banana/pineapple   Anemia    post pregnancy   Anxiety    Asthma    Autoimmune disease, not elsewhere classified(279.49) 2013   Non specific- [facility], abnormal Bone Marrow signal   Breast lump in female 11/28/2012   Has tender round mass at 10-11 0'clock right breast 4 finger breaths from [place] will US   Breast mass, right 08/18/2012   Chlamydia    Chronic breast pain 05/31/2015   Dysmenorrhea 03/23/2014   Dyspareunia 01/05/2014   Elevated prolactin level 02/10/2021   Was 54.5, recheck in December ________   Fibroids, intramural 08/24/2015   GERD (gastroesophageal reflux disease)    Gonorrhea    Headache(784.0)    History of abnormal cervical Pap smear 04/02/2014   Hypertension    Kidney stone    06/2021   LLQ abdominal tenderness 05/31/2015   Menorrhagia 01/05/2014   Neuromuscular disorder ([place])    lower back and bilat leg pain   Obesity    Other and unspecified ovarian cyst 01/12/2014   Polyclonal gammopathy 07/26/2013   Insignificant   Pyelonephritis 06/28/2021  Recurrent upper respiratory infection (URI)    Scoliosis    Urticaria    Vaginal irritation 09/30/2014   Vaginal Pap smear, abnormal     Past Surgical History:  Procedure Laterality Date   ADENOIDECTOMY     BREAST BIOPSY Left 02/2021   CYSTOSCOPY W/ URETERAL STENT PLACEMENT Left 06/29/2021   Procedure: CYSTOSCOPY WITH RETROGRADE PYELOGRAM/URETERAL STENT PLACEMENT;  Surgeon: Cleon Gustin, MD;  Location: [facility];  Service: Urology;  Laterality: Left;   CYSTOSCOPY WITH RETROGRADE PYELOGRAM, URETEROSCOPY AND STENT PLACEMENT Left 07/27/2021   Procedure: CYSTOSCOPY WITH RETROGRADE PYELOGRAM, URETEROSCOPY, STONE EXTRACTION WITH BASKET;  Surgeon: Cleon Gustin, MD;  Location: [facility];  Service: Urology;  Laterality: Left;   DILATATION AND CURETTAGE/HYSTEROSCOPY WITH MINERVA N/A 03/14/2021   Procedure:  HYSTEROSCOPY WITH MINERVA;  Surgeon: Florian Buff, MD;  Location: [facility];  Service: Gynecology;  Laterality: N/A;   DILATION AND CURETTAGE OF UTERUS     LAPAROSCOPIC BILATERAL SALPINGECTOMY Bilateral 03/16/2020   Procedure: LAPAROSCOPIC BILATERAL SALPINGECTOMY;  Surgeon: Florian Buff, MD;  Location: [facility];  Service: Gynecology;  Laterality: Bilateral;   SHOULDER SURGERY Right 03/10/2019   TONSILLECTOMY     TUBAL LIGATION N/A    Phreesia 06/28/2020    OB History     Gravida  2   Para  2   Term  2   Preterm      AB  0   Living  2      SAB  0   IAB      Ectopic      Multiple  0   Live Births  2           Allergies  Allergen Reactions   Banana Rash   Pineapple Rash    Social History   Socioeconomic History   Marital status: Legally Separated    Spouse name: Jameel   Number of children: 2   Years of education: Not on file   Highest education level: Not on file  Occupational History   Not on file  Tobacco Use   Smoking status: Never   Smokeless tobacco: Never  Vaping Use   Vaping Use: Never used  Substance and Sexual Activity   Alcohol use: No   Drug use: No   Sexual activity: Yes    Birth control/protection: Surgical    Comment: tubal & ablation  Other Topics Concern   Not on file  Social History Narrative   Not on file   Social Determinants of Health   Financial Resource Strain: High Risk (08/03/2021)   Overall Financial Resource Strain (CARDIA)    Difficulty of Paying Living Expenses: Very hard  Food Insecurity: No Food Insecurity (08/01/2021)   Hunger Vital Sign    Worried About Running Out of Food in the Last Year: Never true    Ran Out of Food in the Last Year: Never true  Transportation Needs: No Transportation Needs (10/06/2021)   PRAPARE - Hydrologist (Medical): No    Lack of Transportation (Non-Medical): No  Physical Activity: Insufficiently Active (02/23/2020)   Exercise Vital Sign    Days of  Exercise per Week: 2 days    Minutes of Exercise per Session: 10 min  Stress: Stress Concern Present (09/02/2021)   [place]    Feeling of Stress : Rather much  Social Connections: Unknown (09/01/2021)   Social Connection and Isolation Panel [NHANES]  Frequency of Communication with Friends and Family: More than three times a week    Frequency of Social Gatherings with Friends and Family: Three times a week    Attends Religious Services: 1 to 4 times per year    Active Member of Clubs or Organizations: No    Attends Archivist Meetings: Never    Marital Status: Not on file    Family History  Problem Relation Age of Onset   Hypertension Mother    Diabetes Mother    Heart disease Mother    Diabetes Father    Cancer Paternal Aunt        breast   Hypertension Maternal Grandmother    Diabetes Son        pre diabetic   Other Son        overactive bladder   COPD Maternal Aunt    Leukemia Maternal Aunt    Allergic rhinitis Neg Hx    Angioedema Neg Hx    Asthma Neg Hx    Atopy Neg Hx    Eczema Neg Hx    Immunodeficiency Neg Hx    Urticaria Neg Hx

## 2021-03-07 ENCOUNTER — Ambulatory Visit (HOSPITAL_COMMUNITY)
Admission: RE | Admit: 2021-03-07 | Discharge: 2021-03-07 | Disposition: A | Discharge status: Home / Self Care | Payer: BC Managed Care – PPO | Source: Ambulatory Visit | Attending: Adult Health | Admitting: Adult Health

## 2021-03-07 ENCOUNTER — Other Ambulatory Visit: Payer: Self-pay | Admitting: Adult Health

## 2021-03-07 ENCOUNTER — Other Ambulatory Visit: Payer: Self-pay

## 2021-03-07 DIAGNOSIS — N6452 Nipple discharge: Secondary | ICD-10-CM

## 2021-03-07 DIAGNOSIS — R928 Other abnormal and inconclusive findings on diagnostic imaging of breast: Secondary | ICD-10-CM

## 2021-03-07 NOTE — Patient Instructions (Signed)
[name]  03/07/2021     @PREFPERIOPPHARMACY @   Your procedure is scheduled on  03/14/2021.   Report to [facility] at  1200  P.M.   Call this number if you have problems the morning of surgery:  [phone]   Remember:  Do not eat after midnight.  You may drink clear liquids until  0930 am.      At 0930 am, drink your carb drink. You can have nothing else to drink after this.    Clear liquids allowed are:                    Water, Juice (non-citric and without pulp - diabetics please choose diet or no sugar options), Carbonated beverages - (diabetics please choose diet or no sugar options), Clear Tea, Black Coffee only (no creamer, milk or cream including half and half), Plain Jell-O only (diabetics please choose diet or no sugar options), Gatorade (diabetics please choose diet or no sugar options), and Plain Popsicles only     Use your inhalers before your come and bring your rescue inhaler with you.    Take these medicines the morning of surgery with A SIP OF WATER                                       amlodipine.     Do not wear jewelry, make-up or nail polish.  Do not wear lotions, powders, or perfumes, or deodorant.  Do not shave 48 hours prior to surgery.  Men may shave face and neck.  Do not bring valuables to the hospital.  [facility] is not responsible for any belongings or valuables.  Contacts, dentures or bridgework may not be worn into surgery.  Leave your suitcase in the car.  After surgery it may be brought to your room.  For patients admitted to the hospital, discharge time will be determined by your treatment team.  Patients discharged the day of surgery will not be allowed to drive home and must have someone with them for 24 hours.    Special instructions:   DO NOT smoke tobacco or vape for 24 hours before your procedure.  Please read over the following fact sheets that you were given. Coughing and Deep Breathing, Surgical  Site Infection Prevention, Anesthesia Post-op Instructions, and Care and Recovery After Surgery      Dilation and Curettage or Vacuum Curettage, Care After The following information offers guidance on how to care for yourself after your procedure. Your doctor may also give you more specific instructions. If you have problems or questions, contact your doctor. What can I expect after the procedure? After the procedure, it is common to have: Mild pain or cramps. Some bleeding or spotting from the vagina. These may last for up to 2 weeks. Follow these instructions at home: Medicines Take over-the-counter and prescription medicines only as told by your doctor. If told, take steps to prevent problems with pooping (constipation). You may need to: Drink enough fluid to keep your pee (urine) pale yellow. Take medicines. You will be told what medicines to take. Eat foods that are high in fiber. These include beans, whole grains, and fresh fruits and vegetables. Limit foods that are high in fat and sugar. These include fried or sweet foods. Ask your doctor if you should avoid driving or using machines  while you are taking your medicine. Activity  If you were given a medicine to help you relax (sedative) during your procedure, it can affect you for many hours. Do not drive or use machinery until your doctor says that it is safe. Rest as told by your doctor. Get up to take short walks every 1-2 hours. Ask for help if you feel weak or unsteady. Do not lift anything that is heavier than 10 lb (4.5 kg), or the limit that you are told. Return to your normal activities when your doctor says that it is safe. Lifestyle For at least 2 weeks, or as long as told by your doctor: Do not douche. Do not use tampons. Do not have sex. General instructions Do not take baths, swim, or use a hot tub. Ask your doctor if you may take showers. Do not smoke or use any products that contain nicotine or tobacco. These  can delay healing. If you need help quitting, ask your doctor. Wear compression stockings as told by your doctor. It is up to you to get the results of your procedure. Ask how to get your results when they are ready. Keep all follow-up visits. Contact a doctor if: You have very bad cramps that get worse or do not get better with medicine. You have very bad pain in your belly (abdomen). You cannot drink fluids without vomiting. You have pain in the area just above your thighs. You have fluid from your vagina that smells bad. You have a rash. Get help right away if: You are bleeding a lot from your vagina. This means soaking more than one sanitary pad in 1 hour, and this happens for 2 hours in a row. You have a fever that is above 100.33F (38C). Your belly feels very tender or hard. You have chest pain. You have trouble breathing. You feel dizzy or light-headed. You faint. You have pain in your neck or shoulder area. These symptoms may be an emergency. Get help right away. Call your local emergency services (911 in the [place] Do not wait to see if the symptoms will go away. Do not drive yourself to the hospital. Summary After your procedure, it is common to have pain or cramping. It is also common to have bleeding or spotting from your vagina. Rest as told. Get up to take short walks every 1-2 hours. Do not lift anything that is heavier than 10 lb (4.5 kg), or the limit that you are told. Get help right away if you have problems from the procedure. Ask your doctor what problems to watch for. This information is not intended to replace advice given to you by your health care provider. Make sure you discuss any questions you have with your health care provider. Document Revised: 04/06/2020 Document Reviewed: 04/06/2020 Elsevier Patient Education  2022 [place] Anesthesia, Adult, Care After This sheet gives you information about how to care for yourself after your procedure.  Your health care provider may also give you more specific instructions. If you have problems or questions, contact your health care provider. What can I expect after the procedure? After the procedure, the following side effects are common: Pain or discomfort at the IV site. Nausea. Vomiting. Sore throat. Trouble concentrating. Feeling cold or chills. Feeling weak or tired. Sleepiness and fatigue. Soreness and body aches. These side effects can affect parts of the body that were not involved in surgery. Follow these instructions at home: For the time period you were told by  your health care provider:  Rest. Do not participate in activities where you could fall or become injured. Do not drive or use machinery. Do not drink alcohol. Do not take sleeping pills or medicines that cause drowsiness. Do not make important decisions or sign legal documents. Do not take care of children on your own. Eating and drinking Follow any instructions from your health care provider about eating or drinking restrictions. When you feel hungry, start by eating small amounts of foods that are soft and easy to digest (bland), such as toast. Gradually return to your regular diet. Drink enough fluid to keep your urine pale yellow. If you vomit, rehydrate by drinking water, juice, or clear broth. General instructions If you have sleep apnea, surgery and certain medicines can increase your risk for breathing problems. Follow instructions from your health care provider about wearing your sleep device: Anytime you are sleeping, including during daytime naps. While taking prescription pain medicines, sleeping medicines, or medicines that make you drowsy. Have a responsible adult stay with you for the time you are told. It is important to have someone help care for you until you are awake and alert. Return to your normal activities as told by your health care provider. Ask your health care provider what activities  are safe for you. Take over-the-counter and prescription medicines only as told by your health care provider. If you smoke, do not smoke without supervision. Keep all follow-up visits as told by your health care provider. This is important. Contact a health care provider if: You have nausea or vomiting that does not get better with medicine. You cannot eat or drink without vomiting. You have pain that does not get better with medicine. You are unable to pass urine. You develop a skin rash. You have a fever. You have redness around your IV site that gets worse. Get help right away if: You have difficulty breathing. You have chest pain. You have blood in your urine or stool, or you vomit blood. Summary After the procedure, it is common to have a sore throat or nausea. It is also common to feel tired. Have a responsible adult stay with you for the time you are told. It is important to have someone help care for you until you are awake and alert. When you feel hungry, start by eating small amounts of foods that are soft and easy to digest (bland), such as toast. Gradually return to your regular diet. Drink enough fluid to keep your urine pale yellow. Return to your normal activities as told by your health care provider. Ask your health care provider what activities are safe for you. This information is not intended to replace advice given to you by your health care provider. Make sure you discuss any questions you have with your health care provider. Document Revised: 12/31/2019 Document Reviewed: 07/30/2019 Elsevier Patient Education  2022 [place]. How to Use Chlorhexidine for Bathing Chlorhexidine gluconate (CHG) is a germ-killing (antiseptic) solution that is used to clean the skin. It can get rid of the bacteria that normally live on the skin and can keep them away for about 24 hours. To clean your skin with CHG, you may be given: A CHG solution to use in the shower or as part of a  sponge bath. A prepackaged cloth that contains CHG. Cleaning your skin with CHG may help lower the risk for infection: While you are staying in the intensive care unit of the hospital. If you have a vascular  access, such as a central line, to provide short-term or long-term access to your veins. If you have a catheter to drain urine from your bladder. If you are on a ventilator. A ventilator is a machine that helps you breathe by moving air in and out of your lungs. After surgery. What are the risks? Risks of using CHG include: A skin reaction. Hearing loss, if CHG gets in your ears and you have a perforated eardrum. Eye injury, if CHG gets in your eyes and is not rinsed out. The CHG product catching fire. Make sure that you avoid smoking and flames after applying CHG to your skin. Do not use CHG: If you have a chlorhexidine allergy or have previously reacted to chlorhexidine. On babies younger than 72 months of age. How to use CHG solution Use CHG only as told by your health care provider, and follow the instructions on the label. Use the full amount of CHG as directed. Usually, this is one bottle. During a shower Follow these steps when using CHG solution during a shower (unless your health care provider gives you different instructions): Start the shower. Use your normal soap and shampoo to wash your face and hair. Turn off the shower or move out of the shower stream. Pour the CHG onto a clean washcloth. Do not use any type of brush or rough-edged sponge. Starting at your neck, lather your body down to your toes. Make sure you follow these instructions: If you will be having surgery, pay special attention to the part of your body where you will be having surgery. Scrub this area for at least 1 minute. Do not use CHG on your head or face. If the solution gets into your ears or eyes, rinse them well with water. Avoid your genital area. Avoid any areas of skin that have broken skin,  cuts, or scrapes. Scrub your back and under your arms. Make sure to wash skin folds. Let the lather sit on your skin for 1-2 minutes or as long as told by your health care provider. Thoroughly rinse your entire body in the shower. Make sure that all body creases and crevices are rinsed well. Dry off with a clean towel. Do not put any substances on your body afterward--such as powder, lotion, or perfume--unless you are told to do so by your health care provider. Only use lotions that are recommended by the manufacturer. Put on clean clothes or pajamas. If it is the night before your surgery, sleep in clean sheets.  During a sponge bath Follow these steps when using CHG solution during a sponge bath (unless your health care provider gives you different instructions): Use your normal soap and shampoo to wash your face and hair. Pour the CHG onto a clean washcloth. Starting at your neck, lather your body down to your toes. Make sure you follow these instructions: If you will be having surgery, pay special attention to the part of your body where you will be having surgery. Scrub this area for at least 1 minute. Do not use CHG on your head or face. If the solution gets into your ears or eyes, rinse them well with water. Avoid your genital area. Avoid any areas of skin that have broken skin, cuts, or scrapes. Scrub your back and under your arms. Make sure to wash skin folds. Let the lather sit on your skin for 1-2 minutes or as long as told by your health care provider. Using a different clean, wet washcloth, thoroughly rinse  your entire body. Make sure that all body creases and crevices are rinsed well. Dry off with a clean towel. Do not put any substances on your body afterward--such as powder, lotion, or perfume--unless you are told to do so by your health care provider. Only use lotions that are recommended by the manufacturer. Put on clean clothes or pajamas. If it is the night before your  surgery, sleep in clean sheets. How to use CHG prepackaged cloths Only use CHG cloths as told by your health care provider, and follow the instructions on the label. Use the CHG cloth on clean, dry skin. Do not use the CHG cloth on your head or face unless your health care provider tells you to. When washing with the CHG cloth: Avoid your genital area. Avoid any areas of skin that have broken skin, cuts, or scrapes. Before surgery Follow these steps when using a CHG cloth to clean before surgery (unless your health care provider gives you different instructions): Using the CHG cloth, vigorously scrub the part of your body where you will be having surgery. Scrub using a back-and-forth motion for 3 minutes. The area on your body should be completely wet with CHG when you are done scrubbing. Do not rinse. Discard the cloth and let the area air-dry. Do not put any substances on the area afterward, such as powder, lotion, or perfume. Put on clean clothes or pajamas. If it is the night before your surgery, sleep in clean sheets.  For general bathing Follow these steps when using CHG cloths for general bathing (unless your health care provider gives you different instructions). Use a separate CHG cloth for each area of your body. Make sure you wash between any folds of skin and between your fingers and toes. Wash your body in the following order, switching to a new cloth after each step: The front of your neck, shoulders, and chest. Both of your arms, under your arms, and your hands. Your stomach and groin area, avoiding the genitals. Your right leg and foot. Your left leg and foot. The back of your neck, your back, and your buttocks. Do not rinse. Discard the cloth and let the area air-dry. Do not put any substances on your body afterward--such as powder, lotion, or perfume--unless you are told to do so by your health care provider. Only use lotions that are recommended by the manufacturer. Put on  clean clothes or pajamas. Contact a health care provider if: Your skin gets irritated after scrubbing. You have questions about using your solution or cloth. You swallow any chlorhexidine. Call your local poison control center (1-669-187-5216 in the [place] Get help right away if: Your eyes itch badly, or they become very red or swollen. Your skin itches badly and is red or swollen. Your hearing changes. You have trouble seeing. You have swelling or tingling in your mouth or throat. You have trouble breathing. These symptoms may represent a serious problem that is an emergency. Do not wait to see if the symptoms will go away. Get medical help right away. Call your local emergency services (911 in the [place] Do not drive yourself to the hospital. Summary Chlorhexidine gluconate (CHG) is a germ-killing (antiseptic) solution that is used to clean the skin. Cleaning your skin with CHG may help to lower your risk for infection. You may be given CHG to use for bathing. It may be in a bottle or in a prepackaged cloth to use on your skin. Carefully follow your health care provider's  instructions and the instructions on the product label. Do not use CHG if you have a chlorhexidine allergy. Contact your health care provider if your skin gets irritated after scrubbing. This information is not intended to replace advice given to you by your health care provider. Make sure you discuss any questions you have with your health care provider. Document Revised: 06/27/2020 Document Reviewed: 06/27/2020 Elsevier Patient Education  2022 [employer].

## 2021-03-09 ENCOUNTER — Other Ambulatory Visit: Payer: Self-pay

## 2021-03-09 ENCOUNTER — Encounter (HOSPITAL_COMMUNITY)
Admission: RE | Admit: 2021-03-09 | Discharge: 2021-03-09 | Disposition: A | Discharge status: Home / Self Care | Payer: BC Managed Care – PPO | Source: Ambulatory Visit | Attending: Obstetrics & Gynecology | Admitting: Obstetrics & Gynecology

## 2021-03-09 ENCOUNTER — Other Ambulatory Visit: Payer: Self-pay | Admitting: Obstetrics & Gynecology

## 2021-03-09 DIAGNOSIS — Z01818 Encounter for other preprocedural examination: Secondary | ICD-10-CM | POA: Insufficient documentation

## 2021-03-09 LAB — URINALYSIS, ROUTINE W REFLEX MICROSCOPIC
Bilirubin Urine: NEGATIVE
Glucose, UA: NEGATIVE mg/dL
Ketones, ur: NEGATIVE mg/dL
Leukocytes,Ua: NEGATIVE
Nitrite: NEGATIVE
Protein, ur: 30 mg/dL — AB
RBC / HPF: >50 RBC/hpf — ABNORMAL HIGH (ref 0–5)
Specific Gravity, Urine: 1.018 (ref 1.005–1.030)
pH: 7 (ref 5.0–8.0)

## 2021-03-09 LAB — CBC
HCT: 36.9 % (ref 36.0–46.0)
Hemoglobin: 12.3 g/dL (ref 12.0–15.0)
MCH: 30.8 pg (ref 26.0–34.0)
MCHC: 33.3 g/dL (ref 30.0–36.0)
MCV: 92.3 fL (ref 80.0–100.0)
Platelets: 371 10*3/uL (ref 150–400)
RBC: 4 MIL/uL (ref 3.87–5.11)
RDW: 13.3 % (ref 11.5–15.5)
WBC: 10.5 10*3/uL (ref 4.0–10.5)
nRBC: 0 % (ref 0.0–0.2)

## 2021-03-09 LAB — COMPREHENSIVE METABOLIC PANEL
ALT: 8 U/L (ref 0–44)
AST: 12 U/L — ABNORMAL LOW (ref 15–41)
Albumin: 3.8 g/dL (ref 3.5–5.0)
Alkaline Phosphatase: 87 U/L (ref 38–126)
Anion gap: 9 (ref 5–15)
BUN: 10 mg/dL (ref 6–20)
CO2: 23 mmol/L (ref 22–32)
Calcium: 8.7 mg/dL — ABNORMAL LOW (ref 8.9–10.3)
Chloride: 107 mmol/L (ref 98–111)
Creatinine, Ser: 0.76 mg/dL (ref 0.44–1.00)
GFR, Estimated: >60 mL/min (ref >60)
Glucose, Bld: 82 mg/dL (ref 70–99)
Potassium: 3.1 mmol/L — ABNORMAL LOW (ref 3.5–5.1)
Sodium: 139 mmol/L (ref 135–145)
Total Bilirubin: 0.5 mg/dL (ref 0.3–1.2)
Total Protein: 7.4 g/dL (ref 6.5–8.1)

## 2021-03-09 LAB — TYPE AND SCREEN
ABO/RH(D): O POS
Antibody Screen: NEGATIVE

## 2021-03-09 LAB — RAPID HIV SCREEN (HIV 1/2 AB+AG)
HIV 1/2 Antibodies: NONREACTIVE
HIV-1 P24 Antigen - HIV24: NONREACTIVE

## 2021-03-09 LAB — HCG, QUANTITATIVE, PREGNANCY: hCG, Beta Chain, Quant, S: <1 m[IU]/mL (ref <5)

## 2021-03-10 ENCOUNTER — Other Ambulatory Visit: Payer: Self-pay | Admitting: Obstetrics & Gynecology

## 2021-03-10 MED ORDER — POTASSIUM CHLORIDE CRYS ER 20 MEQ PO TBCR: 20.0000 meq | EXTENDED_RELEASE_TABLET | Freq: Every day | ORAL | 11 refills | Status: DC

## 2021-03-10 NOTE — Pre-Procedure Instructions (Signed)
Message sent to Dr Elonda Husky aboot 3.1 potassium.

## 2021-03-14 ENCOUNTER — Encounter (HOSPITAL_COMMUNITY)
Admission: RE | Disposition: A | Discharge status: Home / Self Care | Payer: Self-pay | Source: Home / Self Care | Attending: Obstetrics & Gynecology

## 2021-03-14 ENCOUNTER — Ambulatory Visit (HOSPITAL_COMMUNITY)
Admission: RE | Admit: 2021-03-14 | Discharge: 2021-03-14 | Disposition: A | Discharge status: Home / Self Care | Payer: BC Managed Care – PPO | Attending: Obstetrics & Gynecology | Admitting: Obstetrics & Gynecology

## 2021-03-14 ENCOUNTER — Ambulatory Visit (HOSPITAL_COMMUNITY): Payer: BC Managed Care – PPO | Admitting: Anesthesiology

## 2021-03-14 ENCOUNTER — Encounter (HOSPITAL_COMMUNITY): Payer: Self-pay | Admitting: Obstetrics & Gynecology

## 2021-03-14 DIAGNOSIS — G709 Myoneural disorder, unspecified: Secondary | ICD-10-CM | POA: Diagnosis not present

## 2021-03-14 DIAGNOSIS — I10 Essential (primary) hypertension: Secondary | ICD-10-CM | POA: Insufficient documentation

## 2021-03-14 DIAGNOSIS — D649 Anemia, unspecified: Secondary | ICD-10-CM | POA: Diagnosis not present

## 2021-03-14 DIAGNOSIS — K219 Gastro-esophageal reflux disease without esophagitis: Secondary | ICD-10-CM | POA: Diagnosis not present

## 2021-03-14 DIAGNOSIS — D759 Disease of blood and blood-forming organs, unspecified: Secondary | ICD-10-CM | POA: Insufficient documentation

## 2021-03-14 DIAGNOSIS — R519 Headache, unspecified: Secondary | ICD-10-CM | POA: Insufficient documentation

## 2021-03-14 DIAGNOSIS — J45909 Unspecified asthma, uncomplicated: Secondary | ICD-10-CM | POA: Insufficient documentation

## 2021-03-14 DIAGNOSIS — F419 Anxiety disorder, unspecified: Secondary | ICD-10-CM | POA: Insufficient documentation

## 2021-03-14 DIAGNOSIS — N939 Abnormal uterine and vaginal bleeding, unspecified: Secondary | ICD-10-CM | POA: Insufficient documentation

## 2021-03-14 DIAGNOSIS — Z6841 Body Mass Index (BMI) 40.0 and over, adult: Secondary | ICD-10-CM | POA: Diagnosis not present

## 2021-03-14 HISTORY — PX: DILATATION AND CURETTAGE/HYSTEROSCOPY WITH MINERVA: SHX6851

## 2021-03-14 SURGERY — DILATATION AND CURETTAGE/HYSTEROSCOPY WITH MINERVA
Anesthesia: General

## 2021-03-14 MED ORDER — ONDANSETRON 8 MG PO TBDP: 8.0000 mg | ORAL_TABLET | Freq: Three times a day (TID) | ORAL | 0 refills | Status: DC | PRN

## 2021-03-14 MED ORDER — CEFAZOLIN SODIUM-DEXTROSE 2-4 GM/100ML-% IV SOLN
INTRAVENOUS | Status: AC
  Filled 2021-03-14: qty 100

## 2021-03-14 MED ORDER — LIDOCAINE HCL (PF) 2 % IJ SOLN
INTRAMUSCULAR | Status: AC
  Filled 2021-03-14: qty 5

## 2021-03-14 MED ORDER — DEXAMETHASONE SODIUM PHOSPHATE 10 MG/ML IJ SOLN
INTRAMUSCULAR | Status: AC
  Filled 2021-03-14: qty 1

## 2021-03-14 MED ORDER — ONDANSETRON HCL 4 MG/2ML IJ SOLN
INTRAMUSCULAR | Status: AC
  Filled 2021-03-14: qty 2

## 2021-03-14 MED ORDER — MIDAZOLAM HCL 5 MG/5ML IJ SOLN
INTRAMUSCULAR | Status: DC | PRN
  Administered 2021-03-14: 2 mg via INTRAVENOUS

## 2021-03-14 MED ORDER — FENTANYL CITRATE (PF) 100 MCG/2ML IJ SOLN
INTRAMUSCULAR | Status: AC
  Filled 2021-03-14: qty 2

## 2021-03-14 MED ORDER — LIDOCAINE HCL (CARDIAC) PF 100 MG/5ML IV SOSY
PREFILLED_SYRINGE | INTRAVENOUS | Status: DC | PRN
  Administered 2021-03-14: 100 mg via INTRATRACHEAL

## 2021-03-14 MED ORDER — FENTANYL CITRATE PF 50 MCG/ML IJ SOSY
25.0000 ug | PREFILLED_SYRINGE | INTRAMUSCULAR | Status: DC | PRN
  Administered 2021-03-14 (×3): 50 ug via INTRAVENOUS
  Filled 2021-03-14: qty 1

## 2021-03-14 MED ORDER — FENTANYL CITRATE (PF) 100 MCG/2ML IJ SOLN
INTRAMUSCULAR | Status: DC | PRN
  Administered 2021-03-14: 100 ug via INTRAVENOUS
  Administered 2021-03-14: 50 ug via INTRAVENOUS

## 2021-03-14 MED ORDER — HYDROCODONE-ACETAMINOPHEN 5-325 MG PO TABS
1.0000 | ORAL_TABLET | Freq: Once | ORAL | Status: AC
  Administered 2021-03-14: 1 via ORAL

## 2021-03-14 MED ORDER — PROPOFOL 10 MG/ML IV BOLUS
INTRAVENOUS | Status: DC | PRN
  Administered 2021-03-14: 200 mg via INTRAVENOUS

## 2021-03-14 MED ORDER — HYDROCODONE-ACETAMINOPHEN 5-325 MG PO TABS
ORAL_TABLET | ORAL | Status: AC
  Filled 2021-03-14: qty 1

## 2021-03-14 MED ORDER — FENTANYL CITRATE PF 50 MCG/ML IJ SOSY
PREFILLED_SYRINGE | INTRAMUSCULAR | Status: AC
  Filled 2021-03-14: qty 1

## 2021-03-14 MED ORDER — 0.9 % SODIUM CHLORIDE (POUR BTL) OPTIME
TOPICAL | Status: DC | PRN
  Administered 2021-03-14: 1000 mL

## 2021-03-14 MED ORDER — ONDANSETRON HCL 4 MG/2ML IJ SOLN
INTRAMUSCULAR | Status: DC | PRN
  Administered 2021-03-14: 4 mg via INTRAVENOUS

## 2021-03-14 MED ORDER — LACTATED RINGERS IV SOLN: INTRAVENOUS | Status: DC

## 2021-03-14 MED ORDER — KETOROLAC TROMETHAMINE 10 MG PO TABS: 10.0000 mg | ORAL_TABLET | Freq: Three times a day (TID) | ORAL | 0 refills | Status: DC | PRN

## 2021-03-14 MED ORDER — MIDAZOLAM HCL 2 MG/2ML IJ SOLN
INTRAMUSCULAR | Status: AC
  Filled 2021-03-14: qty 2

## 2021-03-14 MED ORDER — SODIUM CHLORIDE 0.9 % IR SOLN
Status: DC | PRN
  Administered 2021-03-14: 3000 mL

## 2021-03-14 MED ORDER — PROPOFOL 10 MG/ML IV BOLUS
INTRAVENOUS | Status: AC
  Filled 2021-03-14: qty 20

## 2021-03-14 MED ORDER — CHLORHEXIDINE GLUCONATE 0.12 % MT SOLN
15.0000 mL | Freq: Once | OROMUCOSAL | Status: AC
  Administered 2021-03-14: 15 mL via OROMUCOSAL

## 2021-03-14 MED ORDER — KETOROLAC TROMETHAMINE 30 MG/ML IJ SOLN
INTRAMUSCULAR | Status: AC
  Administered 2021-03-14: 30 mg via INTRAVENOUS
  Filled 2021-03-14: qty 1

## 2021-03-14 MED ORDER — CEFAZOLIN SODIUM-DEXTROSE 2-4 GM/100ML-% IV SOLN
2.0000 g | INTRAVENOUS | Status: AC
  Administered 2021-03-14: 2 g via INTRAVENOUS

## 2021-03-14 MED ORDER — KETOROLAC TROMETHAMINE 30 MG/ML IJ SOLN: 30.0000 mg | Freq: Once | INTRAMUSCULAR | Status: AC

## 2021-03-14 MED ORDER — ORAL CARE MOUTH RINSE: 15.0000 mL | Freq: Once | OROMUCOSAL | Status: AC

## 2021-03-14 MED ORDER — ONDANSETRON HCL 4 MG/2ML IJ SOLN: 4.0000 mg | Freq: Once | INTRAMUSCULAR | Status: DC | PRN

## 2021-03-14 MED ORDER — POVIDONE-IODINE 10 % EX SWAB: 2.0000 "application " | Freq: Once | CUTANEOUS | Status: DC

## 2021-03-14 MED ORDER — DEXAMETHASONE SODIUM PHOSPHATE 4 MG/ML IJ SOLN
INTRAMUSCULAR | Status: DC | PRN
  Administered 2021-03-14: 8 mg via INTRAVENOUS

## 2021-03-14 MED ORDER — HYDROCODONE-ACETAMINOPHEN 5-325 MG PO TABS: 1.0000 | ORAL_TABLET | Freq: Four times a day (QID) | ORAL | 0 refills | Status: DC | PRN

## 2021-03-14 SURGICAL SUPPLY — 24 items
BAG HAMPER (MISCELLANEOUS) ×2 IMPLANT
CLOTH BEACON ORANGE TIMEOUT ST (SAFETY) ×2 IMPLANT
COVER LIGHT HANDLE STERIS (MISCELLANEOUS) ×4 IMPLANT
GAUZE 4X4 16PLY ~~LOC~~+RFID DBL (SPONGE) ×4 IMPLANT
GLOVE SRG 8 PF TXTR STRL LF DI (GLOVE) ×1 IMPLANT
GLOVE SURG UNDER POLY LF SZ7 (GLOVE) ×4 IMPLANT
GLOVE SURG UNDER POLY LF SZ8 (GLOVE) ×2
GOWN STRL REUS W/TWL LRG LVL3 (GOWN DISPOSABLE) ×2 IMPLANT
GOWN STRL REUS W/TWL XL LVL3 (GOWN DISPOSABLE) ×2 IMPLANT
HANDPIECE ABLA MINERVA ENDO (MISCELLANEOUS) ×2 IMPLANT
INST SET HYSTEROSCOPY (KITS) ×2 IMPLANT
IV NS IRRIG 3000ML ARTHROMATIC (IV SOLUTION) ×2 IMPLANT
KIT TURNOVER CYSTO (KITS) ×2 IMPLANT
MANIFOLD NEPTUNE II (INSTRUMENTS) ×2 IMPLANT
MARKER SKIN DUAL TIP RULER LAB (MISCELLANEOUS) ×2 IMPLANT
NS IRRIG 1000ML POUR BTL (IV SOLUTION) ×2 IMPLANT
PACK BASIC III (CUSTOM PROCEDURE TRAY) ×2
PACK SRG BSC III STRL LF ECLPS (CUSTOM PROCEDURE TRAY) ×1 IMPLANT
PAD ARMBOARD 7.5X6 YLW CONV (MISCELLANEOUS) ×2 IMPLANT
PAD TELFA 3X4 1S STER (GAUZE/BANDAGES/DRESSINGS) ×2 IMPLANT
SET BASIN LINEN APH (SET/KITS/TRAYS/PACK) ×2 IMPLANT
SET CYSTO W/LG BORE CLAMP LF (SET/KITS/TRAYS/PACK) ×2 IMPLANT
SHEET LAVH (DRAPES) ×2 IMPLANT
TUBE CONNECTING 12X1/4 (SUCTIONS) ×2 IMPLANT

## 2021-03-14 NOTE — Anesthesia Preprocedure Evaluation (Addendum)
Anesthesia Evaluation  Patient identified by MRN, date of birth, ID band Patient awake    Reviewed: Allergy & Precautions, H&P , NPO status , Patient's Chart, lab work & pertinent test results, reviewed documented beta blocker date and time   Airway Mallampati: II  TM Distance: >3 FB Neck ROM: full    Dental no notable dental hx.    Pulmonary asthma ,    Pulmonary exam normal breath sounds clear to auscultation       Cardiovascular Exercise Tolerance: Good hypertension, negative cardio ROS   Rhythm:regular Rate:Normal     Neuro/Psych  Headaches, PSYCHIATRIC DISORDERS Anxiety  Neuromuscular disease    GI/Hepatic Neg liver ROS, GERD  Medicated,  Endo/Other  Morbid obesity  Renal/GU negative Renal ROS  negative genitourinary   Musculoskeletal   Abdominal   Peds  Hematology  (+) Blood dyscrasia, anemia ,   Anesthesia Other Findings   Reproductive/Obstetrics negative OB ROS                            Anesthesia Physical Anesthesia Plan  ASA: 3  Anesthesia Plan: General and General LMA   Post-op Pain Management:    Induction:   PONV Risk Score and Plan: Ondansetron  Airway Management Planned:   Additional Equipment:   Intra-op Plan:   Post-operative Plan:   Informed Consent: I have reviewed the patients History and Physical, chart, labs and discussed the procedure including the risks, benefits and alternatives for the proposed anesthesia with the patient or authorized representative who has indicated his/her understanding and acceptance.     Dental Advisory Given  Plan Discussed with: CRNA  Anesthesia Plan Comments:         Anesthesia Quick Evaluation

## 2021-03-14 NOTE — Op Note (Signed)
Preoperative diagnosis:  1.   AUB                                         2.  Unresponsive to megestrol therapy   Postoperative diagnoses: Same as above   Procedure: Hysteroscopy, endometrial ablation using Minerva  Surgeon: Florian Buff   Anesthesia: Laryngeal mask airway  Findings: The endometrium was normal. There were no fibroid or other abnormalities.  Description of operation: The patient was taken to the operating room and placed in the supine position. She underwent general anesthesia using the laryngeal mask airway. She was placed in the dorsal lithotomy position and prepped and draped in the usual sterile fashion. A Graves speculum was placed and the anterior cervical lip was grasped with a single-tooth tenaculum. The cervix was dilated serially to allow passage of the hysteroscope. Diagnostic hysteroscopy was performed and was found to be normal. I then proceeded to perform the Minerva endometrial ablation.   The uterus sounded to 9 cm The handpiece was attached to the Minerva power source/machine and the handpiece passed the checklist. The array was squeezed down to remove all of the air present.  The array was then place into the endometrial cavity and deployed to a length of 6 cm. The handpiece confirmed appropriate width by being in the green portion of the visual dial. The cervical cuff was then inflated to the point the CO2 indicator was in the green. The endometrial integrity check was then performed and integrity sequence was confirmed x 2. The heating was then begun and carried out for a total of 2 minutes(which is standard therapy time). When the plasma cycle was finished,  the cervical cuff was deflated and the array was removed with tissue present on the silicon membrane. There was appropriate post Minerva bleeding and uterine discharge.     All of the equipment worked well throughout the procedure.  The patient was awakened from anesthesia and taken to the recovery  room in good stable condition all counts were correct. She received 2 g of Ancef and 30 mg of Toradol preoperatively. She will be discharged from the recovery room and followed up in the office in 1- 2 weeks.   She can expect 4 weeks of post procedure bloody watery discharge  Florian Buff, MD  03/14/2021 1:17 PM

## 2021-03-14 NOTE — Anesthesia Procedure Notes (Signed)
Procedure Name: LMA Insertion Date/Time: 03/14/2021 12:44 PM Performed by: Jonna Munro, CRNA Pre-anesthesia Checklist: Patient identified, Patient being monitored, Emergency Drugs available, Timeout performed and Suction available Patient Re-evaluated:Patient Re-evaluated prior to induction Oxygen Delivery Method: Circle System Utilized Preoxygenation: Pre-oxygenation with 100% oxygen Induction Type: IV induction Ventilation: Mask ventilation without difficulty LMA: LMA inserted LMA Size: 4.0 Number of attempts: 1 Placement Confirmation: positive ETCO2 and breath sounds checked- equal and bilateral Tube secured with: Tape Dental Injury: Teeth and Oropharynx as per pre-operative assessment

## 2021-03-14 NOTE — H&P (Signed)
Preoperative History and Physical  [name] is a 40 y.o. [id number] with Patient's last menstrual period was 03/09/2021. admitted for a hysteroscopy uterine curettgae Minerva ablation.  Normal sonogram unresponsive to megestrol  PMH:    Past Medical History:  Diagnosis Date   Allergy    banana/pineapple   Anemia    post pregnancy   Anxiety    Asthma    Autoimmune disease, not elsewhere classified(279.49) 2013   Non specific- [facility], abnormal Bone Marrow signal   Breast lump in female 11/28/2012   Has tender round mass at 10-11 0'clock right breast 4 finger breaths from [place] will US   Breast mass, right 08/18/2012   Chlamydia    Chronic breast pain 05/31/2015   Dysmenorrhea 03/23/2014   Dyspareunia 01/05/2014   Elevated prolactin level 02/10/2021   Was 54.5, recheck in December ________   Fibroids, intramural 08/24/2015   GERD (gastroesophageal reflux disease)    Gonorrhea    Headache(784.0)    History of abnormal cervical Pap smear 04/02/2014   Hypertension    LLQ abdominal tenderness 05/31/2015   Menorrhagia 01/05/2014   Neuromuscular disorder ([place])    lower back and bilat leg pain   Obesity    Other and unspecified ovarian cyst 01/12/2014   Polyclonal gammopathy 07/26/2013   Insignificant   Recurrent upper respiratory infection (URI)    Scoliosis    Urticaria    Vaginal irritation 09/30/2014   Vaginal Pap smear, abnormal     PSH:     Past Surgical History:  Procedure Laterality Date   ADENOIDECTOMY     DILATION AND CURETTAGE OF UTERUS     LAPAROSCOPIC BILATERAL SALPINGECTOMY Bilateral 03/16/2020   Procedure: LAPAROSCOPIC BILATERAL SALPINGECTOMY;  Surgeon: Florian Buff, MD;  Location: [facility];  Service: Gynecology;  Laterality: Bilateral;   SHOULDER SURGERY Right 03/10/2019   TONSILLECTOMY     TUBAL LIGATION N/A    Phreesia 06/28/2020    POb/GynH:      OB History     Gravida  2   Para  2   Term  2   Preterm      AB  0   Living  2       SAB  0   IAB      Ectopic      Multiple  0   Live Births  2           SH:   Social History   Tobacco Use   Smoking status: Never   Smokeless tobacco: Never  Vaping Use   Vaping Use: Never used  Substance Use Topics   Alcohol use: No   Drug use: No    FH:    Family History  Problem Relation Age of Onset   Hypertension Mother    Diabetes Mother    Heart disease Mother    Diabetes Father    Cancer Paternal Aunt        breast   Hypertension Maternal Grandmother    Diabetes Son        pre diabetic   Other Son        overactive bladder   COPD Maternal Aunt    Leukemia Maternal Aunt    Allergic rhinitis Neg Hx    Angioedema Neg Hx    Asthma Neg Hx    Atopy Neg Hx    Eczema Neg Hx    Immunodeficiency Neg Hx    Urticaria Neg Hx  Allergies:  Allergies  Allergen Reactions   Banana Rash   Pineapple Rash    Medications:       Current Facility-Administered Medications:    ceFAZolin (ANCEF) IVPB 2g/100 mL premix, 2 g, Intravenous, On Call to OR, Florian Buff, MD   ketorolac (TORADOL) 30 MG/ML injection 30 mg, 30 mg, Intravenous, Once, Aiesha Leland, Mertie Clause, MD   povidone-iodine 10 % swab 2 application, 2 application, Topical, Once, Florian Buff, MD  Review of Systems:   Review of Systems  Constitutional: Negative for fever, chills, weight loss, malaise/fatigue and diaphoresis.  HENT: Negative for hearing loss, ear pain, nosebleeds, congestion, sore throat, neck pain, tinnitus and ear discharge.   Eyes: Negative for blurred vision, double vision, photophobia, pain, discharge and redness.  Respiratory: Negative for cough, hemoptysis, sputum production, shortness of breath, wheezing and stridor.   Cardiovascular: Negative for chest pain, palpitations, orthopnea, claudication, leg swelling and PND.  Gastrointestinal: Positive for abdominal pain. Negative for heartburn, nausea, vomiting, diarrhea, constipation, blood in stool and melena.  Genitourinary:  Negative for dysuria, urgency, frequency, hematuria and flank pain.  Musculoskeletal: Negative for myalgias, back pain, joint pain and falls.  Skin: Negative for itching and rash.  Neurological: Negative for dizziness, tingling, tremors, sensory change, speech change, focal weakness, seizures, loss of consciousness, weakness and headaches.  Endo/Heme/Allergies: Negative for environmental allergies and polydipsia. Does not bruise/bleed easily.  Psychiatric/Behavioral: Negative for depression, suicidal ideas, hallucinations, memory loss and substance abuse. The patient is not nervous/anxious and does not have insomnia.      PHYSICAL EXAM:  Last menstrual period 03/09/2021, not currently breastfeeding.    Vitals reviewed. Constitutional: She is oriented to person, place, and time. She appears well-developed and well-nourished.  HENT:  Head: Normocephalic and atraumatic.  Right Ear: External ear normal.  Left Ear: External ear normal.  Nose: Nose normal.  Mouth/Throat: Oropharynx is clear and moist.  Eyes: Conjunctivae and EOM are normal. Pupils are equal, round, and reactive to light. Right eye exhibits no discharge. Left eye exhibits no discharge. No scleral icterus.  Neck: Normal range of motion. Neck supple. No tracheal deviation present. No thyromegaly present.  Cardiovascular: Normal rate, regular rhythm, normal heart sounds and intact distal pulses.  Exam reveals no gallop and no friction rub.   No murmur heard. Respiratory: Effort normal and breath sounds normal. No respiratory distress. She has no wheezes. She has no rales. She exhibits no tenderness.  GI: Soft. Bowel sounds are normal. She exhibits no distension and no mass. There is tenderness. There is no rebound and no guarding.  Genitourinary:       Vulva is normal without lesions Vagina is pink moist without discharge Cervix normal in appearance and pap is normal Uterus is normal size, contour, position, consistency,  mobility, non-tender Adnexa is negative with normal sized ovaries by sonogram  Musculoskeletal: Normal range of motion. She exhibits no edema and no tenderness.  Neurological: She is alert and oriented to person, place, and time. She has normal reflexes. She displays normal reflexes. No cranial nerve deficit. She exhibits normal muscle tone. Coordination normal.  Skin: Skin is warm and dry. No rash noted. No erythema. No pallor.  Psychiatric: She has a normal mood and affect. Her behavior is normal. Judgment and thought content normal.    Labs: Results for orders placed or performed during the hospital encounter of 03/09/21 (from the past 336 hour(s))  CBC   Collection Time: 03/09/21  2:09 PM  Result Value Ref  Range   WBC 10.5 4.0 - 10.5 K/uL   RBC 4.00 3.87 - 5.11 MIL/uL   Hemoglobin 12.3 12.0 - 15.0 g/dL   HCT 36.9 36.0 - 46.0 %   MCV 92.3 80.0 - 100.0 fL   MCH 30.8 26.0 - 34.0 pg   MCHC 33.3 30.0 - 36.0 g/dL   RDW 13.3 11.5 - 15.5 %   Platelets 371 150 - 400 K/uL   nRBC 0.0 0.0 - 0.2 %  Comprehensive metabolic panel   Collection Time: 03/09/21  2:09 PM  Result Value Ref Range   Sodium 139 135 - 145 mmol/L   Potassium 3.1 (L) 3.5 - 5.1 mmol/L   Chloride 107 98 - 111 mmol/L   CO2 23 22 - 32 mmol/L   Glucose, Bld 82 70 - 99 mg/dL   BUN 10 6 - 20 mg/dL   Creatinine, Ser 0.76 0.44 - 1.00 mg/dL   Calcium 8.7 (L) 8.9 - 10.3 mg/dL   Total Protein 7.4 6.5 - 8.1 g/dL   Albumin 3.8 3.5 - 5.0 g/dL   AST 12 (L) 15 - 41 U/L   ALT 8 0 - 44 U/L   Alkaline Phosphatase 87 38 - 126 U/L   Total Bilirubin 0.5 0.3 - 1.2 mg/dL   GFR, Estimated >60 >60 mL/min   Anion gap 9 5 - 15  hCG, quantitative, pregnancy   Collection Time: 03/09/21  2:09 PM  Result Value Ref Range   hCG, Beta Chain, Quant, S <1 <5 mIU/mL  Rapid HIV screen (HIV 1/2 Ab+Ag)   Collection Time: 03/09/21  2:09 PM  Result Value Ref Range   HIV-1 P24 Antigen - HIV24 NON REACTIVE NON REACTIVE   HIV 1/2 Antibodies NON  REACTIVE NON REACTIVE   Interpretation (HIV Ag Ab)      A non reactive test result means that HIV 1 or HIV 2 antibodies and HIV 1 p24 antigen were not detected in the specimen.  Urinalysis, Routine w reflex microscopic Urine, Clean Catch   Collection Time: 03/09/21  2:09 PM  Result Value Ref Range   Color, Urine YELLOW YELLOW   APPearance CLEAR CLEAR   Specific Gravity, Urine 1.018 1.005 - 1.030   pH 7.0 5.0 - 8.0   Glucose, UA NEGATIVE NEGATIVE mg/dL   Hgb urine dipstick LARGE (A) NEGATIVE   Bilirubin Urine NEGATIVE NEGATIVE   Ketones, ur NEGATIVE NEGATIVE mg/dL   Protein, ur 30 (A) NEGATIVE mg/dL   Nitrite NEGATIVE NEGATIVE   Leukocytes,Ua NEGATIVE NEGATIVE   RBC / HPF >50 (H) 0 - 5 RBC/hpf   WBC, UA 6-10 0 - 5 WBC/hpf   Bacteria, UA RARE (A) NONE SEEN   Squamous Epithelial / LPF 0-5 0 - 5   Mucus PRESENT   Type and screen   Collection Time: 03/09/21  2:09 PM  Result Value Ref Range   ABO/RH(D) O POS    Antibody Screen NEG    Sample Expiration      03/23/2021,2359 Performed at [facility], [address], [place], [place] [address]     EKG: Orders placed or performed during the hospital encounter of 03/09/21   EKG 12-Lead   EKG 12-Lead    Imaging Studies: US PELVIS TRANSVAGINAL NON-OB (TV ONLY)  Result Date: 02/28/2021 Images from the original result were not included.  Center for [facility] @ [facility] [address] [place], [address]  GYNECOLOGIC SONOGRAM FLEDA PAGEL is a 40 y.o. [id number] LMP 01/19/2021 She is here for a pelvic sonogram for menorrhagia. Uterus                      6.1 x 4.3 x 5.9 cm, Total uterine volume 82 cc, heterogenous anteverted uterus,posterior intramural fibroid 2.1 x 1.2 x 1.1 cm Endometrium          6.5 mm, symmetrical, wnl Right ovary             2.7 x 1.1 x 2.5 cm, wnl Left ovary                2.2 x 2.6 x 1.4 cm, wnl No  free fluid Technician Comments: PELVIC US TA/TV: Heterogenous anteverted uterus,posterior intramural fibroid 2.1 x 1.2 x 1.1 cm,EEC 6.5 mm,normal ovaries,ovaries appear mobile,no pain during ultrasound,no free fluid [employer] 02/22/2021 12:43 PM Clinical Impression and recommendations: I have reviewed the sonogram results above, combined with the patient's current clinical course, below are my impressions and any appropriate recommendations for management based on the sonographic findings. Uterus is normal size shape and contour Endometrium is normal Both ovaries are normal Florian Buff 02/28/2021 3:11 PM  US PELVIS (TRANSABDOMINAL ONLY)  Result Date: 02/28/2021 Images from the original result were not included.  Center for [facility] @ [facility] [place] [address]                                                                                                  GYNECOLOGIC SONOGRAM KAALIYAH KITA is a 40 y.o. [id number] LMP 01/19/2021 She is here for a pelvic sonogram for menorrhagia. Uterus                      6.1 x 4.3 x 5.9 cm, Total uterine volume 82 cc, heterogenous anteverted uterus,posterior intramural fibroid 2.1 x 1.2 x 1.1 cm Endometrium          6.5 mm, symmetrical, wnl Right ovary             2.7 x 1.1 x 2.5 cm, wnl Left ovary                2.2 x 2.6 x 1.4 cm, wnl No free fluid Technician Comments: PELVIC US TA/TV: Heterogenous anteverted uterus,posterior intramural fibroid 2.1 x 1.2 x 1.1 cm,EEC 6.5 mm,normal ovaries,ovaries appear mobile,no pain during ultrasound,no free fluid [employer] 02/22/2021 12:43 PM Clinical Impression and recommendations: I have reviewed the sonogram results above, combined with the patient's current clinical course, below are my impressions and any appropriate recommendations for management based on the sonographic findings. Uterus is normal size shape and contour Endometrium is normal Both ovaries are normal Florian Buff  02/28/2021 3:11 PM  US BREAST LTD UNI LEFT INC AXILLA  Result Date: 03/07/2021 CLINICAL DATA:  40 year old female with bilateral milky nipple discharge which is occasionally bloody which she has been experiencing for the past 18 years since her first child was born. EXAM: DIGITAL DIAGNOSTIC  BILATERAL MAMMOGRAM WITH TOMOSYNTHESIS AND CAD; ULTRASOUND RIGHT BREAST LIMITED; ULTRASOUND LEFT BREAST LIMITED TECHNIQUE: Bilateral digital diagnostic mammography and breast tomosynthesis was performed. The images were evaluated with computer-aided detection.; Targeted ultrasound examination of the right breast was performed; Targeted ultrasound examination of the left breast was performed. COMPARISON:  Prior screening mammogram dated 12/10/2012. ACR Breast Density Category b: There are scattered areas of fibroglandular density. FINDINGS: Tubular densities within the bilateral retroareolar breasts are compatible with dilated ducts. No suspicious masses or calcifications seen in the right breast. There is an oval circumscribed mass in the outer right breast measuring 1 point cm. There mass in the inner right breast measuring approximately 1 cm. Targeted ultrasound of the retroareolar bilateral breasts was performed demonstrating markedly dilated ducts containing heterogeneous fluid, all of which appears mobile without intraductal masses identified. Targeted ultrasound of the outer left breast was performed demonstrating an oval circumscribed hypoechoic mass at 3 o'clock 8 cm from nipple measuring 1.8 x 1 x 1.9 cm. This corresponds with the mass seen in the outer left breast at mammography. Targeted ultrasound of the inner left breast was performed demonstrating an oval mass with internal fat density at 9 o'clock 7 cm from nipple demonstrating imaging features suggestive of benign intramammary lymph node measuring 1.2 x 0.6 x 1 cm. This corresponds well with the mass seen in the inner left breast at mammography. No  lymphadenopathy seen in the left axilla. IMPRESSION: 1. Indeterminate 1.9 cm mass in the left breast at the 3 o'clock position, possibly a fibroadenoma however appears new when compared to prior mammogram and therefore tissue sampling is warranted. 2. Probably benign likely intramammary lymph node in the inner left breast at the 9 o'clock position. 3. Longstanding greater than 18 year history of bilateral milky and occasional bloody nipple discharge, likely physiologic/possibly related to ductal ectasia. RECOMMENDATION: 1. Recommend ultrasound-guided biopsy of the mass in the left breast the 3 o'clock position. 2. Recommend short-term follow-up of the probable lymph node in the inner left breast at the 9 o'clock position. If pathology from the mass in the outer left breast returns as abnormal/surgical, then biopsy of this mass would be recommended. 3. Recommend further management of nipple discharge be based on clinical assessment. This is felt to be physiologic/related to a benign process given longstanding greater than 18 year duration, however the patient does report this as occasionally bloody (although this is a longstanding process as well and may be related to benign ductal ectasia). If the patient experiences spontaneous unilateral bloody or clear nipple discharge from a single duct only, then further evaluation with breast MRI is recommended. I have discussed the findings and recommendations with the patient. If applicable, a reminder letter will be sent to the patient regarding the next appointment. BI-RADS CATEGORY  4: Suspicious. Electronically Signed   By: Everlean Alstrom M.D.   On: 03/07/2021 15:35  US BREAST LTD UNI RIGHT INC AXILLA  Result Date: 03/07/2021 CLINICAL DATA:  40 year old female with bilateral milky nipple discharge which is occasionally bloody which she has been experiencing for the past 18 years since her first child was born. EXAM: DIGITAL DIAGNOSTIC BILATERAL MAMMOGRAM WITH  TOMOSYNTHESIS AND CAD; ULTRASOUND RIGHT BREAST LIMITED; ULTRASOUND LEFT BREAST LIMITED TECHNIQUE: Bilateral digital diagnostic mammography and breast tomosynthesis was performed. The images were evaluated with computer-aided detection.; Targeted ultrasound examination of the right breast was performed; Targeted ultrasound examination of the left breast was performed. COMPARISON:  Prior screening mammogram dated 12/10/2012. ACR Breast Density Category  b: There are scattered areas of fibroglandular density. FINDINGS: Tubular densities within the bilateral retroareolar breasts are compatible with dilated ducts. No suspicious masses or calcifications seen in the right breast. There is an oval circumscribed mass in the outer right breast measuring 1 point cm. There mass in the inner right breast measuring approximately 1 cm. Targeted ultrasound of the retroareolar bilateral breasts was performed demonstrating markedly dilated ducts containing heterogeneous fluid, all of which appears mobile without intraductal masses identified. Targeted ultrasound of the outer left breast was performed demonstrating an oval circumscribed hypoechoic mass at 3 o'clock 8 cm from nipple measuring 1.8 x 1 x 1.9 cm. This corresponds with the mass seen in the outer left breast at mammography. Targeted ultrasound of the inner left breast was performed demonstrating an oval mass with internal fat density at 9 o'clock 7 cm from nipple demonstrating imaging features suggestive of benign intramammary lymph node measuring 1.2 x 0.6 x 1 cm. This corresponds well with the mass seen in the inner left breast at mammography. No lymphadenopathy seen in the left axilla. IMPRESSION: 1. Indeterminate 1.9 cm mass in the left breast at the 3 o'clock position, possibly a fibroadenoma however appears new when compared to prior mammogram and therefore tissue sampling is warranted. 2. Probably benign likely intramammary lymph node in the inner left breast at the 9  o'clock position. 3. Longstanding greater than 18 year history of bilateral milky and occasional bloody nipple discharge, likely physiologic/possibly related to ductal ectasia. RECOMMENDATION: 1. Recommend ultrasound-guided biopsy of the mass in the left breast the 3 o'clock position. 2. Recommend short-term follow-up of the probable lymph node in the inner left breast at the 9 o'clock position. If pathology from the mass in the outer left breast returns as abnormal/surgical, then biopsy of this mass would be recommended. 3. Recommend further management of nipple discharge be based on clinical assessment. This is felt to be physiologic/related to a benign process given longstanding greater than 18 year duration, however the patient does report this as occasionally bloody (although this is a longstanding process as well and may be related to benign ductal ectasia). If the patient experiences spontaneous unilateral bloody or clear nipple discharge from a single duct only, then further evaluation with breast MRI is recommended. I have discussed the findings and recommendations with the patient. If applicable, a reminder letter will be sent to the patient regarding the next appointment. BI-RADS CATEGORY  4: Suspicious. Electronically Signed   By: Everlean Alstrom M.D.   On: 03/07/2021 15:35  MM DIAG BREAST TOMO BILATERAL  Result Date: 03/07/2021 CLINICAL DATA:  40 year old female with bilateral milky nipple discharge which is occasionally bloody which she has been experiencing for the past 18 years since her first child was born. EXAM: DIGITAL DIAGNOSTIC BILATERAL MAMMOGRAM WITH TOMOSYNTHESIS AND CAD; ULTRASOUND RIGHT BREAST LIMITED; ULTRASOUND LEFT BREAST LIMITED TECHNIQUE: Bilateral digital diagnostic mammography and breast tomosynthesis was performed. The images were evaluated with computer-aided detection.; Targeted ultrasound examination of the right breast was performed; Targeted ultrasound examination of the  left breast was performed. COMPARISON:  Prior screening mammogram dated 12/10/2012. ACR Breast Density Category b: There are scattered areas of fibroglandular density. FINDINGS: Tubular densities within the bilateral retroareolar breasts are compatible with dilated ducts. No suspicious masses or calcifications seen in the right breast. There is an oval circumscribed mass in the outer right breast measuring 1 point cm. There mass in the inner right breast measuring approximately 1 cm. Targeted ultrasound of the retroareolar  bilateral breasts was performed demonstrating markedly dilated ducts containing heterogeneous fluid, all of which appears mobile without intraductal masses identified. Targeted ultrasound of the outer left breast was performed demonstrating an oval circumscribed hypoechoic mass at 3 o'clock 8 cm from nipple measuring 1.8 x 1 x 1.9 cm. This corresponds with the mass seen in the outer left breast at mammography. Targeted ultrasound of the inner left breast was performed demonstrating an oval mass with internal fat density at 9 o'clock 7 cm from nipple demonstrating imaging features suggestive of benign intramammary lymph node measuring 1.2 x 0.6 x 1 cm. This corresponds well with the mass seen in the inner left breast at mammography. No lymphadenopathy seen in the left axilla. IMPRESSION: 1. Indeterminate 1.9 cm mass in the left breast at the 3 o'clock position, possibly a fibroadenoma however appears new when compared to prior mammogram and therefore tissue sampling is warranted. 2. Probably benign likely intramammary lymph node in the inner left breast at the 9 o'clock position. 3. Longstanding greater than 18 year history of bilateral milky and occasional bloody nipple discharge, likely physiologic/possibly related to ductal ectasia. RECOMMENDATION: 1. Recommend ultrasound-guided biopsy of the mass in the left breast the 3 o'clock position. 2. Recommend short-term follow-up of the probable lymph  node in the inner left breast at the 9 o'clock position. If pathology from the mass in the outer left breast returns as abnormal/surgical, then biopsy of this mass would be recommended. 3. Recommend further management of nipple discharge be based on clinical assessment. This is felt to be physiologic/related to a benign process given longstanding greater than 18 year duration, however the patient does report this as occasionally bloody (although this is a longstanding process as well and may be related to benign ductal ectasia). If the patient experiences spontaneous unilateral bloody or clear nipple discharge from a single duct only, then further evaluation with breast MRI is recommended. I have discussed the findings and recommendations with the patient. If applicable, a reminder letter will be sent to the patient regarding the next appointment. BI-RADS CATEGORY  4: Suspicious. Electronically Signed   By: Everlean Alstrom M.D.   On: 03/07/2021 15:35     Assessment: AUB, with normal sonogram, unresponsive to megestrol  Plan: Hysteroscopy uterine curettage Minerva endometrial ablation  Florian Buff 03/14/2021 12:14 PM

## 2021-03-14 NOTE — Anesthesia Postprocedure Evaluation (Signed)
Anesthesia Post Note  Patient: [name]  Procedure(s) Performed: HYSTEROSCOPY WITH MINERVA  Patient location during evaluation: Phase II Anesthesia Type: General Level of consciousness: awake Pain management: pain level controlled Vital Signs Assessment: post-procedure vital signs reviewed and stable Respiratory status: spontaneous breathing and respiratory function stable Cardiovascular status: blood pressure returned to baseline and stable Postop Assessment: no headache and no apparent nausea or vomiting Anesthetic complications: no Comments: Late entry   No notable events documented.   Last Vitals:  Vitals:   03/14/21 1415 03/14/21 1427  BP: (!) 136/99   Pulse: 77 92  Resp: (!) 21 20  Temp:    SpO2: 95% 94%    Last Pain:  Vitals:   03/14/21 1427  TempSrc:   PainSc: [place]

## 2021-03-14 NOTE — Transfer of Care (Signed)
Immediate Anesthesia Transfer of Care Note  Patient: [name]  Procedure(s) Performed: HYSTEROSCOPY WITH MINERVA  Patient Location: PACU  Anesthesia Type:General  Level of Consciousness: awake and patient cooperative  Airway & Oxygen Therapy: Patient Spontanous Breathing and Patient connected to nasal cannula oxygen  Post-op Assessment: Report given to RN and Post -op Vital signs reviewed and stable  Post vital signs: Reviewed and stable  Last Vitals:  Vitals Value Taken Time  BP    Temp 98.3   Pulse 86 03/14/21 1325  Resp 36 03/14/21 1325  SpO2 97 % 03/14/21 1325  Vitals shown include unvalidated device data.  Last Pain:  Vitals:   03/14/21 1208  TempSrc: [place]  PainSc: 0-No pain         Complications: No notable events documented.

## 2021-03-15 ENCOUNTER — Encounter (HOSPITAL_COMMUNITY): Payer: Self-pay | Admitting: Obstetrics & Gynecology

## 2021-03-16 ENCOUNTER — Other Ambulatory Visit: Payer: Self-pay | Admitting: Adult Health

## 2021-03-16 ENCOUNTER — Ambulatory Visit (HOSPITAL_COMMUNITY)
Admission: RE | Admit: 2021-03-16 | Discharge: 2021-03-16 | Disposition: A | Discharge status: Home / Self Care | Payer: BC Managed Care – PPO | Source: Ambulatory Visit | Attending: Adult Health | Admitting: Adult Health

## 2021-03-16 ENCOUNTER — Encounter (HOSPITAL_COMMUNITY): Payer: Self-pay

## 2021-03-16 ENCOUNTER — Other Ambulatory Visit: Payer: Self-pay

## 2021-03-16 DIAGNOSIS — R928 Other abnormal and inconclusive findings on diagnostic imaging of breast: Secondary | ICD-10-CM | POA: Diagnosis not present

## 2021-03-16 MED ORDER — LIDOCAINE HCL (PF) 1 % IJ SOLN
INTRAMUSCULAR | Status: AC
  Administered 2021-03-16: 13:00:00 5 mL
  Filled 2021-03-16: qty 5

## 2021-03-16 MED ORDER — LIDOCAINE-EPINEPHRINE (PF) 1 %-1:200000 IJ SOLN
INTRAMUSCULAR | Status: AC
  Administered 2021-03-16: 10 mL
  Filled 2021-03-16: qty 30

## 2021-03-16 NOTE — Progress Notes (Signed)
PT tolerated left breast biopsy well today with NAD noted. PT verbalized understanding of discharge instructions. PT ambulated back to the mammogram area this time and given an ice pack. Samples sent to lab at this time by ultrasound tech for processing.

## 2021-03-17 LAB — SURGICAL PATHOLOGY

## 2021-03-21 ENCOUNTER — Ambulatory Visit (INDEPENDENT_AMBULATORY_CARE_PROVIDER_SITE_OTHER): Payer: BC Managed Care – PPO | Admitting: Obstetrics & Gynecology

## 2021-03-21 ENCOUNTER — Encounter: Payer: Self-pay | Admitting: Obstetrics & Gynecology

## 2021-03-21 VITALS — Ht 65.0 in | Wt 270.0 lb

## 2021-03-21 DIAGNOSIS — Z9889 Other specified postprocedural states: Secondary | ICD-10-CM

## 2021-03-21 NOTE — Progress Notes (Addendum)
   Post op telephone visit Pt is at home and I am in my office Total time : 10 minutes  HPI: Patient returns for routine postoperative follow-up having undergone hysteroscopy uterine curettage [place] endometrial abaltion on 03/14/21.  The patient's immediate postoperative recovery has been unremarkable. Since hospital discharge the patient reports no problems, cramps when lifts.   Current Outpatient Medications: acetaminophen (TYLENOL) 500 MG tablet, Take 1,000 mg by mouth every 6 (six) hours as needed for mild pain., Disp: , Rfl:  albuterol (PROVENTIL HFA) 108 (90 Base) MCG/ACT inhaler, Inhale 2 puffs into the lungs every 6 (six) hours as needed for wheezing (cough)., Disp: 8 g, Rfl: 5 amLODipine (NORVASC) 5 MG tablet, TAKE 1 TABLET BY MOUTH ONCE A DAY., Disp: 90 tablet, Rfl: 0 fluticasone (FLONASE) 50 MCG/ACT nasal spray, Place 2 sprays into both nostrils daily as needed for allergies or rhinitis., Disp: , Rfl:  fluticasone-salmeterol (ADVAIR HFA) 115-21 MCG/ACT inhaler, Inhale 2 puffs into the lungs 2 (two) times daily. (Patient taking differently: Inhale 2 puffs into the lungs daily.), Disp: 1 each, Rfl: 12 hydrochlorothiazide (MICROZIDE) 12.5 MG capsule, TAKE (1) CAPSULE BY MOUTH ONCE EVERY DAY., Disp: 90 capsule, Rfl: 0 ketorolac (TORADOL) 10 MG tablet, Take 1 tablet (10 mg total) by mouth every 8 (eight) hours as needed., Disp: 15 tablet, Rfl: 0 Olopatadine HCl 0.2 % SOLN, Use 1 drop in each eye once a day as needed for itchy watery eyes (Patient taking differently: Place 1 drop into both eyes every 6 (six) hours as needed (itchy watery eyes).), Disp: 2.5 mL, Rfl: 3 ondansetron (ZOFRAN ODT) 8 MG disintegrating tablet, Take 1 tablet (8 mg total) by mouth every 8 (eight) hours as needed for nausea or vomiting., Disp: 8 tablet, Rfl: 0 potassium chloride SA (KLOR-CON) 20 MEQ tablet, Take 1 tablet (20 mEq total) by mouth daily., Disp: 30 tablet, Rfl: 11 EPINEPHrine 0.3 mg/0.3 mL IJ SOAJ  injection, Inject 0.3 mg into the muscle as needed for anaphylaxis. (Patient not taking: Reported on 03/06/2021), Disp: 1 each, Rfl: 1  No current facility-administered medications for this visit.    Height 5\' 5"  (1.651 m), weight 270 lb (122.5 kg), last menstrual period 03/09/2021, not currently breastfeeding.  Physical Exam: N/a telephone visit  Diagnostic Tests:   Pathology: benign  Impression: 1. S/P hysteroscopy endometrial ablation      Plan: Return if symptoms worsen or fail to improve.    Florian Buff, MD

## 2021-03-31 ENCOUNTER — Encounter: Payer: Self-pay | Admitting: Adult Health

## 2021-03-31 ENCOUNTER — Ambulatory Visit: Payer: BC Managed Care – PPO | Admitting: Adult Health

## 2021-03-31 ENCOUNTER — Other Ambulatory Visit: Payer: Self-pay

## 2021-03-31 VITALS — BP 132/86 | HR 84 | Ht 65.0 in | Wt 280.0 lb

## 2021-03-31 DIAGNOSIS — N644 Mastodynia: Secondary | ICD-10-CM | POA: Diagnosis not present

## 2021-03-31 DIAGNOSIS — D242 Benign neoplasm of left breast: Secondary | ICD-10-CM

## 2021-03-31 DIAGNOSIS — N6452 Nipple discharge: Secondary | ICD-10-CM

## 2021-03-31 MED ORDER — AMOXICILLIN-POT CLAVULANATE 875-125 MG PO TABS: 1.0000 | ORAL_TABLET | Freq: Two times a day (BID) | ORAL | 0 refills | Status: DC

## 2021-03-31 NOTE — Progress Notes (Signed)
  Subjective:     Patient ID: [name], female   DOB: 1980-08-09, 40 y.o.   MRN: [id number]  HPI [name] is a 40 year old black female, worked in for pain in left breast, she had biopsy 03/16/21 and was fine and it started hurting last week, like shooting pain. PCP is Dr Posey Pronto Lab Results  Component Value Date   DIAGPAP  02/07/2021    - Negative for intraepithelial lesion or malignancy (NILM)   HPV NOT DETECTED 04/10/2018   [place] Negative 02/07/2021     Review of Systems Pain in left breast, had biopsy 03/16/21 Has nipple discharge at times has been spontaneous once  Reviewed past medical,surgical, social and family history. Reviewed medications and allergies.     Objective:   Physical Exam BP 132/86 (BP Location: Left Arm, Patient Position: Sitting, Cuff Size: Large)   Pulse 84   Ht 5\' 5"  (1.651 m)   Wt 280 lb (127 kg)   LMP 03/09/2021   Breastfeeding No   BMI 46.59 kg/m      Skin warm and dry,  Breasts:no dominate palpable mass, retraction or nipple discharge, the left breast is tender at nipple and entire outer quadrant, no redness or temperature changes. Fall risk is low  [place] - 03/31/21 1050       Pregnancy Intention Screening   Does the patient want to become pregnant in the next year? No    Does the patient's partner want to become pregnant in the next year? No    Would the patient like to discuss contraceptive options today? No      Contraception Wrap Up   Current Method Female Sterilization    End Method Female Sterilization    Contraception Counseling Provided No             Assessment:     1. Pain of left breast Sp biopsy 03/16/21  Will rx Augmentin for prophylaxis,in case has bacteria present  Use tylenol and advil and can use heating pad or ice  Meds ordered this encounter  Medications   amoxicillin-clavulanate (AUGMENTIN) 875-125 MG tablet    Sig: Take 1 tablet by mouth 2 (two) times daily.    Dispense:  20 tablet    Refill:   0    Order Specific Question:   Supervising Provider    Answer:   Florian Buff [[id number]]   Keep appt with radiologist Tuesday  2. Fibroadenoma of breast, left  3. Nipple discharge Was spontaneous once this week If occurs again with get MRI left breast     Plan:     Follow up in 2 weeks with me

## 2021-04-01 ENCOUNTER — Telehealth: Payer: BC Managed Care – PPO | Admitting: Emergency Medicine

## 2021-04-01 DIAGNOSIS — R300 Dysuria: Secondary | ICD-10-CM | POA: Diagnosis not present

## 2021-04-01 MED ORDER — NITROFURANTOIN MONOHYD MACRO 100 MG PO CAPS: 100.0000 mg | ORAL_CAPSULE | Freq: Two times a day (BID) | ORAL | 0 refills | Status: AC

## 2021-04-01 NOTE — Progress Notes (Signed)

## 2021-04-01 NOTE — Progress Notes (Signed)
I have spent 5 minutes in review of e-visit questionnaire, review and updating patient chart, medical decision making and response to patient.   Nyara Capell, PA-C    

## 2021-04-04 ENCOUNTER — Other Ambulatory Visit (HOSPITAL_COMMUNITY): Payer: Self-pay | Admitting: Adult Health

## 2021-04-04 DIAGNOSIS — N644 Mastodynia: Secondary | ICD-10-CM

## 2021-04-11 ENCOUNTER — Other Ambulatory Visit: Payer: Self-pay

## 2021-04-11 ENCOUNTER — Ambulatory Visit (HOSPITAL_COMMUNITY)
Admission: RE | Admit: 2021-04-11 | Discharge: 2021-04-11 | Disposition: A | Discharge status: Home / Self Care | Payer: BC Managed Care – PPO | Source: Ambulatory Visit | Attending: Adult Health | Admitting: Adult Health

## 2021-04-11 DIAGNOSIS — N644 Mastodynia: Secondary | ICD-10-CM | POA: Insufficient documentation

## 2021-04-14 ENCOUNTER — Ambulatory Visit: Payer: Medicaid Other | Admitting: Adult Health

## 2021-04-19 ENCOUNTER — Ambulatory Visit: Payer: Medicaid Other | Admitting: Adult Health

## 2021-04-19 ENCOUNTER — Telehealth: Payer: BC Managed Care – PPO | Admitting: Physician Assistant

## 2021-04-19 ENCOUNTER — Other Ambulatory Visit: Payer: Self-pay | Admitting: Internal Medicine

## 2021-04-19 DIAGNOSIS — R3989 Other symptoms and signs involving the genitourinary system: Secondary | ICD-10-CM

## 2021-04-19 DIAGNOSIS — I10 Essential (primary) hypertension: Secondary | ICD-10-CM

## 2021-04-19 MED ORDER — AMLODIPINE BESYLATE 5 MG PO TABS: 5.0000 mg | ORAL_TABLET | Freq: Every day | ORAL | 0 refills | Status: DC

## 2021-04-20 MED ORDER — CEPHALEXIN 500 MG PO CAPS: 500.0000 mg | ORAL_CAPSULE | Freq: Two times a day (BID) | ORAL | 0 refills | Status: DC

## 2021-04-20 NOTE — Progress Notes (Signed)

## 2021-05-13 ENCOUNTER — Telehealth: Payer: BC Managed Care – PPO | Admitting: Nurse Practitioner

## 2021-05-13 DIAGNOSIS — M545 Low back pain, unspecified: Secondary | ICD-10-CM

## 2021-05-13 DIAGNOSIS — R3989 Other symptoms and signs involving the genitourinary system: Secondary | ICD-10-CM

## 2021-05-13 NOTE — Progress Notes (Signed)
Based on what you shared with me it looks like you have uti symptoms with back pain,that should be evaluated in a face to face office visit. Due to the associating back pain you will need a urinalysis and urine culture for proper treatment. NOTE: There will be NO CHARGE for this eVisit   If you are having a true medical emergency please call 911.      For an urgent face to face visit, [facility] has six urgent care centers for your convenience:     [facility] [place] Get Driving Directions [phone] [address] [place], [place] [address]    [facility] ([place]) Get Driving Directions [phone] [address] [place], [place] [address]  [facility]) Get Driving Directions [phone] [address] [place],  [place]  [address]  [facility] Get Driving Directions [phone] [address] [place], [place] [address]    [facility] [place] Get Driving Directions  [phone] [address] [place], [place] [address]    [facility] at [place] Get Driving Directions [phone] [address] [place],  [address]  Your MyChart E-visit questionnaire answers were reviewed by a board certified advanced clinical practitioner to complete your personal care plan based on your specific symptoms.  Thank you for using e-Visits.         

## 2021-05-24 ENCOUNTER — Other Ambulatory Visit: Payer: Self-pay | Admitting: Internal Medicine

## 2021-05-24 ENCOUNTER — Telehealth: Payer: BC Managed Care – PPO | Admitting: Family

## 2021-05-24 DIAGNOSIS — I10 Essential (primary) hypertension: Secondary | ICD-10-CM

## 2021-05-24 DIAGNOSIS — N390 Urinary tract infection, site not specified: Secondary | ICD-10-CM

## 2021-05-24 MED ORDER — HYDROCHLOROTHIAZIDE 12.5 MG PO CAPS: 12.5000 mg | ORAL_CAPSULE | Freq: Every day | ORAL | 0 refills | Status: DC

## 2021-05-24 NOTE — Progress Notes (Signed)
Based on what you shared with me, I feel your condition warrants further evaluation and I recommend that you be seen in a face to face visit.  After reviewing your chart, you have had multiple evists for UTI symptoms over the last few months. You need to be seen person for urine culture.   NOTE: There will be NO CHARGE for this eVisit   If you are having a true medical emergency please call 911.      For an urgent face to face visit, [facility] has six urgent care centers for your convenience:     [facility] [place] [facility] [place] Get Driving Directions [phone] [place], [place] [address]    [facility]) Get Driving Directions [phone] [place], [place] [address]  [facility] [place] [facility]) Get Driving Directions [phone] [address] [place],  [place]  [address]  [facility] Get Driving Directions [phone] [place], [place], [place] [address]   [facility] Get Driving Directions  [phone] [address] [place], [place] [address]   [facility] [place] Get Driving Directions [phone] [address], [place], [place] [address]  Your MyChart E-visit questionnaire answers were reviewed by a board certified advanced clinical practitioner to complete your personal care plan based on your specific symptoms.  Thank you for using e-Visits.

## 2021-05-29 ENCOUNTER — Telehealth: Payer: Medicaid Other | Admitting: Nurse Practitioner

## 2021-05-29 DIAGNOSIS — B3731 Acute candidiasis of vulva and vagina: Secondary | ICD-10-CM

## 2021-05-29 MED ORDER — FLUCONAZOLE 150 MG PO TABS: 150.0000 mg | ORAL_TABLET | Freq: Once | ORAL | 0 refills | Status: AC

## 2021-05-29 NOTE — Progress Notes (Signed)
E-Visit for Vaginal Symptoms  We are sorry that you are not feeling well. Here is how we plan to help! Based on what you shared with me it looks like you: May have a yeast vaginosis  Vaginosis is an inflammation of the vagina that can result in discharge, itching and pain. The cause is usually a change in the normal balance of vaginal bacteria or an infection. Vaginosis can also result from reduced estrogen levels after menopause.  The most common causes of vaginosis are:   Bacterial vaginosis which results from an overgrowth of one on several organisms that are normally present in your vagina.   Yeast infections which are caused by a naturally occurring fungus called candida.   Vaginal atrophy (atrophic vaginosis) which results from the thinning of the vagina from reduced estrogen levels after menopause.   Trichomoniasis which is caused by a parasite and is commonly transmitted by sexual intercourse.  Factors that increase your risk of developing vaginosis include: Medications, such as antibiotics and steroids Uncontrolled diabetes Use of hygiene products such as bubble bath, vaginal spray or vaginal deodorant Douching Wearing damp or tight-fitting clothing Using an intrauterine device (IUD) for birth control Hormonal changes, such as those associated with pregnancy, birth control pills or menopause Sexual activity Having a sexually transmitted infection  Your treatment plan is A single Diflucan (fluconazole) 150mg tablet once.  I have electronically sent this prescription into the pharmacy that you have chosen.  Be sure to take all of the medication as directed. Stop taking any medication if you develop a rash, tongue swelling or shortness of breath. Mothers who are breast feeding should consider pumping and discarding their breast milk while on these antibiotics. However, there is no consensus that infant exposure at these doses would be harmful.  Remember that medication creams can  weaken latex condoms. .   HOME CARE:  Good hygiene may prevent some types of vaginosis from recurring and may relieve some symptoms:  Avoid baths, hot tubs and whirlpool spas. Rinse soap from your outer genital area after a shower, and dry the area well to prevent irritation. Don't use scented or harsh soaps, such as those with deodorant or antibacterial action. Avoid irritants. These include scented tampons and pads. Wipe from front to back after using the toilet. Doing so avoids spreading fecal bacteria to your vagina.  Other things that may help prevent vaginosis include:  Don't douche. Your vagina doesn't require cleansing other than normal bathing. Repetitive douching disrupts the normal organisms that reside in the vagina and can actually increase your risk of vaginal infection. Douching won't clear up a vaginal infection. Use a latex condom. Both female and female latex condoms may help you avoid infections spread by sexual contact. Wear cotton underwear. Also wear pantyhose with a cotton crotch. If you feel comfortable without it, skip wearing underwear to bed. Yeast thrives in moist environments Your symptoms should improve in the next day or two.  GET HELP RIGHT AWAY IF:  You have pain in your lower abdomen ( pelvic area or over your ovaries) You develop nausea or vomiting You develop a fever Your discharge changes or worsens You have persistent pain with intercourse You develop shortness of breath, a rapid pulse, or you faint.  These symptoms could be signs of problems or infections that need to be evaluated by a medical provider now.  MAKE SURE YOU   Understand these instructions. Will watch your condition. Will get help right away if you are not   doing well or get worse.  Thank you for choosing an e-visit.  Your e-visit answers were reviewed by a board certified advanced clinical practitioner to complete your personal care plan. Depending upon the condition, your plan  could have included both over the counter or prescription medications.  Please review your pharmacy choice. Make sure the pharmacy is open so you can pick up prescription now. If there is a problem, you may contact your provider through MyChart messaging and have the prescription routed to another pharmacy.  Your safety is important to us. If you have drug allergies check your prescription carefully.   For the next 24 hours you can use MyChart to ask questions about today's visit, request a non-urgent call back, or ask for a work or school excuse. You will get an email in the next two days asking about your experience. I hope that your e-visit has been valuable and will speed your recovery.   I spent approximately 5 minutes reviewing the patient's history, current symptoms and coordinating their plan of care today.    Meds ordered this encounter  Medications   fluconazole (DIFLUCAN) 150 MG tablet    Sig: Take 1 tablet (150 mg total) by mouth once for 1 dose.    Dispense:  1 tablet    Refill:  0    

## 2021-06-06 ENCOUNTER — Telehealth: Payer: Medicaid Other | Admitting: Physician Assistant

## 2021-06-06 DIAGNOSIS — N760 Acute vaginitis: Secondary | ICD-10-CM

## 2021-06-06 NOTE — Progress Notes (Signed)
Based on what you shared with me, I feel your condition warrants further evaluation and I recommend that you be seen in a face to face visit.  You were recently treated for a yeast infection with continued vaginal discharge and recent UTI. Due to this you will need to be evaluated in person for proper testing to make sure you are given the most appropriate treatment(s).    NOTE: There will be NO CHARGE for this eVisit   If you are having a true medical emergency please call 911.      For an urgent face to face visit, [facility] has six urgent care centers for your convenience:     [facility] [place] [facility] [place] Get Driving Directions [phone] [place], [place] [address]    [facility]) Get Driving Directions [phone] [place], [place] [address]  [facility] [place] [facility]) Get Driving Directions [phone] [address] [place],  [place]  [address]  [facility] Get Driving Directions [phone] [place], [place], [place] [address]   [facility] Get Driving Directions  [phone] [address] [place], [place] [address]   [facility] [place] Get Driving Directions [phone] [address], [place], [place] [address]  Your MyChart E-visit questionnaire answers were reviewed by a board certified advanced clinical practitioner to complete your personal care plan based on your specific symptoms.  Thank you for using e-Visits.

## 2021-06-16 ENCOUNTER — Ambulatory Visit: Payer: Medicaid Other | Admitting: Internal Medicine

## 2021-06-28 ENCOUNTER — Emergency Department (HOSPITAL_COMMUNITY): Payer: Medicaid Other

## 2021-06-28 ENCOUNTER — Inpatient Hospital Stay (HOSPITAL_COMMUNITY)
Admission: EM | Admit: 2021-06-28 | Discharge: 2021-07-02 | DRG: 854 | Disposition: A | Discharge status: Home / Self Care | Payer: Medicaid Other | Attending: Internal Medicine | Admitting: Internal Medicine

## 2021-06-28 ENCOUNTER — Inpatient Hospital Stay (HOSPITAL_COMMUNITY): Payer: Medicaid Other

## 2021-06-28 ENCOUNTER — Other Ambulatory Visit: Payer: Self-pay

## 2021-06-28 DIAGNOSIS — N189 Chronic kidney disease, unspecified: Secondary | ICD-10-CM | POA: Diagnosis not present

## 2021-06-28 DIAGNOSIS — N1330 Unspecified hydronephrosis: Secondary | ICD-10-CM | POA: Diagnosis not present

## 2021-06-28 DIAGNOSIS — K5730 Diverticulosis of large intestine without perforation or abscess without bleeding: Secondary | ICD-10-CM | POA: Diagnosis not present

## 2021-06-28 DIAGNOSIS — Z20822 Contact with and (suspected) exposure to covid-19: Secondary | ICD-10-CM | POA: Diagnosis present

## 2021-06-28 DIAGNOSIS — Z1611 Resistance to penicillins: Secondary | ICD-10-CM | POA: Diagnosis present

## 2021-06-28 DIAGNOSIS — N12 Tubulo-interstitial nephritis, not specified as acute or chronic: Secondary | ICD-10-CM

## 2021-06-28 DIAGNOSIS — A419 Sepsis, unspecified organism: Secondary | ICD-10-CM | POA: Diagnosis not present

## 2021-06-28 DIAGNOSIS — Z91018 Allergy to other foods: Secondary | ICD-10-CM

## 2021-06-28 DIAGNOSIS — N132 Hydronephrosis with renal and ureteral calculous obstruction: Secondary | ICD-10-CM | POA: Diagnosis not present

## 2021-06-28 DIAGNOSIS — Z6841 Body Mass Index (BMI) 40.0 and over, adult: Secondary | ICD-10-CM

## 2021-06-28 DIAGNOSIS — I129 Hypertensive chronic kidney disease with stage 1 through stage 4 chronic kidney disease, or unspecified chronic kidney disease: Secondary | ICD-10-CM | POA: Diagnosis not present

## 2021-06-28 DIAGNOSIS — I10 Essential (primary) hypertension: Secondary | ICD-10-CM | POA: Diagnosis not present

## 2021-06-28 DIAGNOSIS — A4151 Sepsis due to Escherichia coli [E. coli]: Secondary | ICD-10-CM | POA: Diagnosis not present

## 2021-06-28 DIAGNOSIS — N136 Pyonephrosis: Secondary | ICD-10-CM | POA: Diagnosis present

## 2021-06-28 DIAGNOSIS — E559 Vitamin D deficiency, unspecified: Secondary | ICD-10-CM | POA: Diagnosis not present

## 2021-06-28 DIAGNOSIS — Z87442 Personal history of urinary calculi: Secondary | ICD-10-CM | POA: Diagnosis not present

## 2021-06-28 DIAGNOSIS — Z825 Family history of asthma and other chronic lower respiratory diseases: Secondary | ICD-10-CM | POA: Diagnosis not present

## 2021-06-28 DIAGNOSIS — J45909 Unspecified asthma, uncomplicated: Secondary | ICD-10-CM | POA: Diagnosis present

## 2021-06-28 DIAGNOSIS — Z8249 Family history of ischemic heart disease and other diseases of the circulatory system: Secondary | ICD-10-CM | POA: Diagnosis not present

## 2021-06-28 DIAGNOSIS — R188 Other ascites: Secondary | ICD-10-CM | POA: Diagnosis not present

## 2021-06-28 DIAGNOSIS — R509 Fever, unspecified: Secondary | ICD-10-CM | POA: Diagnosis not present

## 2021-06-28 DIAGNOSIS — N179 Acute kidney failure, unspecified: Secondary | ICD-10-CM | POA: Diagnosis not present

## 2021-06-28 DIAGNOSIS — E876 Hypokalemia: Secondary | ICD-10-CM | POA: Diagnosis not present

## 2021-06-28 DIAGNOSIS — K219 Gastro-esophageal reflux disease without esophagitis: Secondary | ICD-10-CM | POA: Diagnosis not present

## 2021-06-28 DIAGNOSIS — Z79899 Other long term (current) drug therapy: Secondary | ICD-10-CM

## 2021-06-28 DIAGNOSIS — Z87440 Personal history of urinary (tract) infections: Secondary | ICD-10-CM

## 2021-06-28 DIAGNOSIS — R6520 Severe sepsis without septic shock: Secondary | ICD-10-CM | POA: Diagnosis not present

## 2021-06-28 DIAGNOSIS — N201 Calculus of ureter: Secondary | ICD-10-CM | POA: Diagnosis not present

## 2021-06-28 DIAGNOSIS — D649 Anemia, unspecified: Secondary | ICD-10-CM | POA: Diagnosis not present

## 2021-06-28 HISTORY — DX: Tubulo-interstitial nephritis, not specified as acute or chronic: N12

## 2021-06-28 LAB — CBC WITH DIFFERENTIAL/PLATELET
Abs Immature Granulocytes: 0.13 10*3/uL — ABNORMAL HIGH (ref 0.00–0.07)
Basophils Absolute: 0 10*3/uL (ref 0.0–0.1)
Basophils Relative: 0 %
Eosinophils Absolute: 0 10*3/uL (ref 0.0–0.5)
Eosinophils Relative: 0 %
HCT: 38.4 % (ref 36.0–46.0)
Hemoglobin: 12.6 g/dL (ref 12.0–15.0)
Immature Granulocytes: 1 %
Lymphocytes Relative: 3 %
Lymphs Abs: 0.5 10*3/uL — ABNORMAL LOW (ref 0.7–4.0)
MCH: 29.3 pg (ref 26.0–34.0)
MCHC: 32.8 g/dL (ref 30.0–36.0)
MCV: 89.3 fL (ref 80.0–100.0)
Monocytes Absolute: 0.2 10*3/uL (ref 0.1–1.0)
Monocytes Relative: 1 %
Neutro Abs: 14.9 10*3/uL — ABNORMAL HIGH (ref 1.7–7.7)
Neutrophils Relative %: 95 %
Platelets: 275 10*3/uL (ref 150–400)
RBC: 4.3 MIL/uL (ref 3.87–5.11)
RDW: 13.8 % (ref 11.5–15.5)
WBC: 15.8 10*3/uL — ABNORMAL HIGH (ref 4.0–10.5)
nRBC: 0 % (ref 0.0–0.2)

## 2021-06-28 LAB — RESP PANEL BY RT-PCR (FLU A&B, COVID) ARPGX2
Influenza A by PCR: NEGATIVE
Influenza B by PCR: NEGATIVE
SARS Coronavirus 2 by RT PCR: NEGATIVE

## 2021-06-28 LAB — URINALYSIS, ROUTINE W REFLEX MICROSCOPIC
Bilirubin Urine: NEGATIVE
Glucose, UA: NEGATIVE mg/dL
Ketones, ur: 5 mg/dL — AB
Nitrite: POSITIVE — AB
Protein, ur: 30 mg/dL — AB
Specific Gravity, Urine: 1.021 (ref 1.005–1.030)
WBC, UA: >50 WBC/hpf — ABNORMAL HIGH (ref 0–5)
pH: 6 (ref 5.0–8.0)

## 2021-06-28 LAB — COMPREHENSIVE METABOLIC PANEL
ALT: 9 U/L (ref 0–44)
AST: 16 U/L (ref 15–41)
Albumin: 3.4 g/dL — ABNORMAL LOW (ref 3.5–5.0)
Alkaline Phosphatase: 83 U/L (ref 38–126)
Anion gap: 11 (ref 5–15)
BUN: 13 mg/dL (ref 6–20)
CO2: 22 mmol/L (ref 22–32)
Calcium: 8.3 mg/dL — ABNORMAL LOW (ref 8.9–10.3)
Chloride: 102 mmol/L (ref 98–111)
Creatinine, Ser: 1.19 mg/dL — ABNORMAL HIGH (ref 0.44–1.00)
GFR, Estimated: >60 mL/min (ref >60)
Glucose, Bld: 169 mg/dL — ABNORMAL HIGH (ref 70–99)
Potassium: 2.9 mmol/L — ABNORMAL LOW (ref 3.5–5.1)
Sodium: 135 mmol/L (ref 135–145)
Total Bilirubin: 0.9 mg/dL (ref 0.3–1.2)
Total Protein: 7.4 g/dL (ref 6.5–8.1)

## 2021-06-28 LAB — HCG, SERUM, QUALITATIVE: Preg, Serum: NEGATIVE

## 2021-06-28 LAB — PROTIME-INR
INR: 1.1 (ref 0.8–1.2)
Prothrombin Time: 14.2 seconds (ref 11.4–15.2)

## 2021-06-28 LAB — LACTIC ACID, PLASMA
Lactic Acid, Venous: 2.6 mmol/L (ref 0.5–1.9)
Lactic Acid, Venous: 2.5 mmol/L (ref 0.5–1.9)

## 2021-06-28 MED ORDER — POTASSIUM CHLORIDE CRYS ER 20 MEQ PO TBCR: 30.0000 meq | EXTENDED_RELEASE_TABLET | Freq: Four times a day (QID) | ORAL | Status: DC

## 2021-06-28 MED ORDER — SODIUM CHLORIDE 0.9 % IV BOLUS
1000.0000 mL | Freq: Once | INTRAVENOUS | Status: AC
  Administered 2021-06-28: 1000 mL via INTRAVENOUS

## 2021-06-28 MED ORDER — OXYCODONE HCL 5 MG PO TABS
5.0000 mg | ORAL_TABLET | ORAL | Status: DC | PRN
  Administered 2021-06-29 – 2021-07-01 (×6): 5 mg via ORAL
  Filled 2021-06-28 (×6): qty 1

## 2021-06-28 MED ORDER — HEPARIN SODIUM (PORCINE) 5000 UNIT/ML IJ SOLN
5000.0000 [IU] | Freq: Three times a day (TID) | INTRAMUSCULAR | Status: DC
  Administered 2021-06-28 – 2021-07-02 (×11): 5000 [IU] via SUBCUTANEOUS
  Filled 2021-06-28 (×11): qty 1

## 2021-06-28 MED ORDER — POTASSIUM CHLORIDE CRYS ER 20 MEQ PO TBCR
40.0000 meq | EXTENDED_RELEASE_TABLET | Freq: Once | ORAL | Status: AC
  Administered 2021-06-28: 40 meq via ORAL
  Filled 2021-06-28: qty 2

## 2021-06-28 MED ORDER — SODIUM CHLORIDE 0.9 % IV SOLN
1.0000 g | Freq: Once | INTRAVENOUS | Status: AC
  Administered 2021-06-28: 1 g via INTRAVENOUS
  Filled 2021-06-28: qty 10

## 2021-06-28 MED ORDER — SODIUM CHLORIDE 0.9 % IV BOLUS
250.0000 mL | Freq: Once | INTRAVENOUS | Status: AC
  Administered 2021-06-28: 250 mL via INTRAVENOUS

## 2021-06-28 MED ORDER — POTASSIUM CHLORIDE IN NACL 40-0.9 MEQ/L-% IV SOLN: INTRAVENOUS | Status: DC

## 2021-06-28 MED ORDER — FLUTICASONE PROPIONATE 50 MCG/ACT NA SUSP: 2.0000 | Freq: Every day | NASAL | Status: DC | PRN

## 2021-06-28 MED ORDER — POTASSIUM CHLORIDE CRYS ER 20 MEQ PO TBCR
20.0000 meq | EXTENDED_RELEASE_TABLET | Freq: Once | ORAL | Status: AC
  Administered 2021-06-29: 20 meq via ORAL
  Filled 2021-06-28: qty 1

## 2021-06-28 MED ORDER — CHLORHEXIDINE GLUCONATE CLOTH 2 % EX PADS
6.0000 | MEDICATED_PAD | Freq: Every day | CUTANEOUS | Status: DC
  Administered 2021-06-29 – 2021-06-30 (×2): 6 via TOPICAL

## 2021-06-28 MED ORDER — POTASSIUM CHLORIDE 10 MEQ/100ML IV SOLN
10.0000 meq | Freq: Once | INTRAVENOUS | Status: AC
Start: 2021-06-28 — End: 2021-06-28
  Administered 2021-06-28: 10 meq via INTRAVENOUS
  Filled 2021-06-28: qty 100

## 2021-06-28 MED ORDER — LACTATED RINGERS IV BOLUS
30.0000 mL/kg | Freq: Once | INTRAVENOUS | Status: AC
  Administered 2021-06-28: 3675 mL via INTRAVENOUS

## 2021-06-28 MED ORDER — POTASSIUM CHLORIDE CRYS ER 20 MEQ PO TBCR: 20.0000 meq | EXTENDED_RELEASE_TABLET | Freq: Four times a day (QID) | ORAL | Status: DC

## 2021-06-28 MED ORDER — ACETAMINOPHEN 650 MG RE SUPP
650.0000 mg | Freq: Four times a day (QID) | RECTAL | Status: DC | PRN
Start: 2021-06-28 — End: 2021-07-02

## 2021-06-28 MED ORDER — MOMETASONE FURO-FORMOTEROL FUM 200-5 MCG/ACT IN AERO
2.0000 | INHALATION_SPRAY | Freq: Two times a day (BID) | RESPIRATORY_TRACT | Status: DC
  Administered 2021-06-29 – 2021-07-02 (×4): 2 via RESPIRATORY_TRACT
  Filled 2021-06-28: qty 8.8

## 2021-06-28 MED ORDER — POTASSIUM CHLORIDE IN NACL 20-0.9 MEQ/L-% IV SOLN
INTRAVENOUS | Status: AC
Start: 2021-06-28 — End: 2021-06-29
  Filled 2021-06-28: qty 1000

## 2021-06-28 MED ORDER — SODIUM CHLORIDE 0.9 % IV SOLN
1.0000 g | Freq: Three times a day (TID) | INTRAVENOUS | Status: DC
  Administered 2021-06-28 – 2021-07-01 (×8): 1 g via INTRAVENOUS
  Filled 2021-06-28 (×8): qty 20

## 2021-06-28 MED ORDER — ACETAMINOPHEN 325 MG PO TABS
650.0000 mg | ORAL_TABLET | Freq: Four times a day (QID) | ORAL | Status: DC | PRN
  Administered 2021-06-28 – 2021-07-02 (×6): 650 mg via ORAL
  Filled 2021-06-28 (×6): qty 2

## 2021-06-28 NOTE — ED Notes (Addendum)
Date and time results received: 06/28/21 4:52 PM ? ? ?Test: Lactic Acid ?Critical Value: 2.6 ? ?Name of Provider Notified: Evalee Jefferson ? ? ?Orders Received? Or Actions Taken?: see orders ?

## 2021-06-28 NOTE — ED Provider Notes (Signed)
[facility] EMERGENCY DEPARTMENT Provider Note   CSN: [id number] Arrival date & time: 06/28/21  1511     History  Chief Complaint  Patient presents with   Flank Pain    [name] is a 41 y.o. female with a history significant for hypertension, anemia, asthma, GERD, chronic kidney disease (followed by Dr. Theador Hawthorne) and who reports frequent UTIs presenting for evaluation of dysuria along with fever to 104.  She woke early this morning with left flank pain that radiates into her left lateral abdomen, she reports nausea without emesis, she has had increased urinary frequency and discomfort with urination consistent with prior UTIs..  she states she can usually tell when she has a uti by the smell of her urine, stating it started smelling strong last week, but had no other sx until today. She denies abdominal pain, chest pain, shortness of breath.  She has taken Tylenol for her fever, last dose was at 3 PM.  She denies history of kidney infections or kidney stones.  She was last treated for UTI in December with a course of Keflex.  The history is provided by the patient.      Home Medications Prior to Admission medications   Medication Sig Start Date End Date Taking? Authorizing Provider  acetaminophen (TYLENOL) 500 MG tablet Take 1,000 mg by mouth every 6 (six) hours as needed for mild pain.   Yes [provider]  albuterol (PROVENTIL HFA) 108 (90 Base) MCG/ACT inhaler Inhale 2 puffs into the lungs every 6 (six) hours as needed for wheezing (cough). 09/30/20  Yes [name], MD  amLODipine (NORVASC) 5 MG tablet Take 1 tablet (5 mg total) by mouth daily. 04/19/21  Yes [name], MD  fluticasone (FLONASE) 50 MCG/ACT nasal spray Place 2 sprays into both nostrils daily as needed for allergies or rhinitis.   Yes [provider]  fluticasone-salmeterol (ADVAIR HFA) 115-21 MCG/ACT inhaler Inhale 2 puffs into the lungs 2 (two) times daily. Patient taking  differently: Inhale 2 puffs into the lungs daily. 09/30/20  Yes [name], MD  hydrochlorothiazide (MICROZIDE) 12.5 MG capsule Take 1 capsule (12.5 mg total) by mouth daily. TAKE (1) CAPSULE BY MOUTH ONCE EVERY DAY. Strength: 12.5 mg 05/24/21  Yes [name], MD  ketorolac (TORADOL) 10 MG tablet Take 10 mg by mouth every 6 (six) hours as needed.   Yes [provider]  Olopatadine HCl 0.2 % SOLN Use 1 drop in each eye once a day as needed for itchy watery eyes Patient taking differently: Place 1 drop into both eyes every 6 (six) hours as needed (itchy watery eyes). 12/07/20  Yes Althea Charon, FNP  cephALEXin (KEFLEX) 500 MG capsule Take 1 capsule (500 mg total) by mouth 2 (two) times daily. Patient not taking: Reported on 06/28/2021 04/20/21   Mar Daring, PA-C  EPINEPHrine 0.3 mg/0.3 mL IJ SOAJ injection Inject 0.3 mg into the muscle as needed for anaphylaxis. Patient not taking: Reported on 03/06/2021 12/07/20   Althea Charon, FNP  potassium chloride SA (KLOR-CON) 20 MEQ tablet Take 1 tablet (20 mEq total) by mouth daily. Patient not taking: Reported on 06/28/2021 03/10/21   Florian Buff, MD  zolpidem (AMBIEN) 10 MG tablet TAKE (1) TABLET BY MOUTH AT BEDTIME. Patient not taking: No sig reported 02/28/16 11/12/18  Alycia Rossetti, MD      Allergies    Banana and Pineapple    Review of Systems   Review of  Systems  Constitutional:  Positive for fever. Negative for chills.  HENT:  Negative for congestion and sore throat.   Eyes: Negative.   Respiratory:  Negative for chest tightness and shortness of breath.   Cardiovascular:  Negative for chest pain.  Gastrointestinal:  Positive for nausea. Negative for abdominal pain and vomiting.  Genitourinary:  Positive for dysuria, flank pain and frequency.  Musculoskeletal:  Negative for arthralgias, joint swelling and neck pain.  Skin: Negative.  Negative for rash and wound.  Neurological:  Negative for dizziness,  weakness, light-headedness, numbness and headaches.  Psychiatric/Behavioral: Negative.    All other systems reviewed and are negative.  Physical Exam Updated Vital Signs BP 109/63    Pulse (!) 122    Temp (!) 101.1 F (38.4 C) (Oral)    Resp (!) 24    Wt 122.5 kg    SpO2 99%    BMI 44.93 kg/m  Physical Exam Vitals and nursing note reviewed.  Constitutional:      Appearance: She is well-developed.  HENT:     Head: Normocephalic and atraumatic.  Eyes:     Conjunctiva/sclera: Conjunctivae normal.  Cardiovascular:     Rate and Rhythm: Regular rhythm. Tachycardia present.     Heart sounds: Normal heart sounds.  Pulmonary:     Effort: Pulmonary effort is normal. No respiratory distress.     Breath sounds: Normal breath sounds. No wheezing.     Comments: tachypnea Abdominal:     General: Bowel sounds are normal.     Palpations: Abdomen is soft.     Tenderness: There is no abdominal tenderness. There is left CVA tenderness.  Musculoskeletal:        General: Normal range of motion.     Cervical back: Normal range of motion.  Skin:    General: Skin is warm and dry.  Neurological:     Mental Status: She is alert.    ED Results / Procedures / Treatments   Labs (all labs ordered are listed, but only abnormal results are displayed) Labs Reviewed  COMPREHENSIVE METABOLIC PANEL - Abnormal; Notable for the following components:      Result Value   Potassium 2.9 (*)    Glucose, Bld 169 (*)    Creatinine, Ser 1.19 (*)    Calcium 8.3 (*)    Albumin 3.4 (*)    All other components within normal limits  LACTIC ACID, PLASMA - Abnormal; Notable for the following components:   Lactic Acid, Venous 2.6 (*)    All other components within normal limits  CBC WITH DIFFERENTIAL/PLATELET - Abnormal; Notable for the following components:   WBC 15.8 (*)    Neutro Abs 14.9 (*)    Lymphs Abs 0.5 (*)    Abs Immature Granulocytes 0.13 (*)    All other components within normal limits  URINALYSIS,  ROUTINE W REFLEX MICROSCOPIC - Abnormal; Notable for the following components:   APPearance CLOUDY (*)    Hgb urine dipstick MODERATE (*)    Ketones, ur 5 (*)    Protein, ur 30 (*)    Nitrite POSITIVE (*)    Leukocytes,Ua LARGE (*)    WBC, UA >50 (*)    Bacteria, UA MANY (*)    All other components within normal limits  CULTURE, BLOOD (ROUTINE X 2)  CULTURE, BLOOD (ROUTINE X 2)  RESP PANEL BY RT-PCR (FLU A&B, COVID) ARPGX2  URINE CULTURE  PROTIME-INR  HCG, SERUM, QUALITATIVE  LACTIC ACID, PLASMA    EKG None  Radiology DG Chest Port 1 View  Result Date: 06/28/2021 CLINICAL DATA:  Fever EXAM: PORTABLE CHEST 1 VIEW COMPARISON:  01/11/2020 FINDINGS: The heart size and mediastinal contours are within normal limits. Both lungs are clear. The visualized skeletal structures are unremarkable. IMPRESSION: No active disease. Electronically Signed   By: Elmer Picker M.D.   On: 06/28/2021 16:34    Procedures Procedures    Medications Ordered in ED Medications  potassium chloride 10 mEq in 100 mL IVPB (has no administration in time range)  lactated ringers bolus 3,675 mL (3,675 mLs Intravenous New Bag/Given 06/28/21 1612)  cefTRIAXone (ROCEPHIN) 1 g in sodium chloride 0.9 % 100 mL IVPB (0 g Intravenous Stopped 06/28/21 1702)    ED Course/ Medical Decision Making/ A&P Clinical Course as of 06/28/21 1811  Wed Jun 28, 2021  1752 Recheck temp 98.5 [JI]    Clinical Course User Index [JI] Evalee Jefferson, PA-C                           Medical Decision Making Pt with urinary sx including left flank pain and fever to 104.  Frequent uti's, last urine cx 9/22 revealing ESBL sensitive to rocephin.  Presents with tachycardia, hypotension and tachypnea, meeting sirs criteria.    Amount and/or Complexity of Data Reviewed External Data Reviewed: labs and notes.    Details: prior labs reviewed.  prior pcp notes reviewed Labs: ordered.    Details: Labs of significance including a WBC count of  15.8, she has hypokalemia with a potassium of 2.9, her initial lactic acid is 2.6.  She does have a significant UTI, nitrite positive, large number of leukocytes and bacteria.  Blood cultures are currently pending.  Urine culture also pending. Radiology: ordered. Discussion of management or test interpretation with external provider(s): Discussed pt with Dr. [facility] who accepts pt for admission.    Risk Decision regarding hospitalization. Risk Details: Pt was given 30mg /kg LR bolus, rocephin.  Remains tachycardic, bp improved.     CRITICAL CARE Performed by: Evalee Jefferson Total critical care time: 45 minutes Critical care time was exclusive of separately billable procedures and treating other patients. Critical care was necessary to treat or prevent imminent or life-threatening deterioration. Critical care was time spent personally by me on the following activities: development of treatment plan with patient and/or surrogate as well as nursing, discussions with consultants, evaluation of patient's response to treatment, examination of patient, obtaining history from patient or surrogate, ordering and performing treatments and interventions, ordering and review of laboratory studies, ordering and review of radiographic studies, pulse oximetry and re-evaluation of patient's condition.         Final Clinical Impression(s) / ED Diagnoses Final diagnoses:  Sepsis ([place])  Sepsis without acute organ dysfunction, due to unspecified organism [facility])  Pyelonephritis    Rx / DC Orders ED Discharge Orders     None         Landis Martins 06/28/21 1844    Hayden Rasmussen, MD 06/29/21 [phone]

## 2021-06-28 NOTE — Progress Notes (Signed)
Pharmacy Antibiotic Note ? ?[name] is a 41 y.o. female admitted on 06/28/2021 with UTI.  Pharmacy has been consulted for meropenem dosing. Patient with ESBL e. Coli in September 2022. WBC 15.8. Tmax 101.1 in ED.  LA 2.5.  ? ?Plan: ?Start meropenem 1g q8h  ?Monitor renal function, cultures, and clinical progression ? ?Weight: 122.5 kg (270 lb) ? ?Temp (24hrs), Avg:101.1 ?F (38.4 ?C), Min:101.1 ?F (38.4 ?C), Max:101.1 ?F (38.4 ?C) ? ?Recent Labs  ?Lab 06/28/21 ?1603 06/28/21 ?1805  ?WBC 15.8*  --   ?CREATININE 1.19*  --   ?LATICACIDVEN 2.6* 2.5*  ?  ?Estimated Creatinine Clearance: 84.2 mL/min (A) (by C-G formula based on SCr of 1.19 mg/dL (H)).   ? ?Allergies  ?Allergen Reactions  ? Banana Rash  ? Pineapple Rash  ? ? ?Antimicrobials this admission: ?Meropenem 3/1 >> ?CTX x1 3/1  ? ?Dose adjustments this admission: ? ? ?Microbiology results: ?3/1 bcx:  ?3/1 ucx: ? ?Thank you for allowing pharmacy to be a part of this patient?s care. ? ?Cristela Felt, PharmD, BCPS ?Clinical Pharmacist ?06/28/2021 6:53 PM ? ? ?

## 2021-06-28 NOTE — ED Notes (Signed)
Dr. Melina Copa notified of Sepsis triggers. ?

## 2021-06-28 NOTE — ED Notes (Signed)
Floor was updated on pt's disposition ?

## 2021-06-28 NOTE — ED Triage Notes (Signed)
Left flank pain that radiates into her back that started today with fever 104.0 F. Pt took Tylenol at 1500. Pt reports nausea and hx of uti's. Denies vomiting or diarrhea. ?

## 2021-06-28 NOTE — ED Notes (Signed)
Patient transported to CT 

## 2021-06-28 NOTE — ED Notes (Signed)
Provider is considering changing pt disposition to step down. Will wait 2 more hours to see if pt HR lowers, if not pt will be changed to step down ?

## 2021-06-28 NOTE — H&P (Signed)
TRH H&P   Patient Demographics:    [name], is a 41 y.o. female  MRN: [id number]   DOB - 10/13/1980  Admit Date - 06/28/2021  Outpatient Primary MD for the patient is [name], MD  Referring MD/NP/PA: PA Idol    Patient coming from: Home  Chief Complaint  Patient presents with   Flank Pain      HPI:    [name]  is a 41 y.o. female, past medical history of hypertension, anemia, asthma, GERD, CKD followed by Dr. Theador Hawthorne, recurrent UTIs, with ESBL and December of last year, as well she had another urine infection last December, patient presents to ED secondary to fever of 104, dysuria and left flank pain, radiating into her left lateral abdomen, she does report nausea, but no vomiting, reports symptoms resembles previous UTIs, she reports history of kidney stones in the past which did not require any intervention, she denies any hemoptysis, dyspnea, chest pain. -In ED patient was noted to be hypotensive, tachycardic despite IV fluid resuscitation, lactic acid elevated at 2.6, sodium low at 2.9, creatinine at 1.1, leukocytosis at 15, she had positive UA, CT renal stone protocol significan Obstructing 6 mm distal left ureteral calculus, with moderate left-sided obstructive uropathy.blood cultures were sent, urine cultures were sent, she was started on meropenem due to history of ESBL UTI in the past.   Review of systems:    In addition to the HPI above,    A full 10 point Review of Systems was done, except as stated above, all other Review of Systems were negative.   With Past History of the following :    Past Medical History:  Diagnosis Date   Allergy    banana/pineapple   Anemia    post pregnancy   Anxiety    Asthma    Autoimmune disease, not elsewhere classified(279.49) 2013   Non specific- [facility], abnormal Bone Marrow signal    Breast lump in female 11/28/2012   Has tender round mass at 10-11 0'clock right breast 4 finger breaths from [place] will US   Breast mass, right 08/18/2012   Chlamydia    Chronic breast pain 05/31/2015   Dysmenorrhea 03/23/2014   Dyspareunia 01/05/2014   Elevated prolactin level 02/10/2021   Was 54.5, recheck in December ________   Fibroids, intramural 08/24/2015   GERD (gastroesophageal reflux disease)    Gonorrhea    Headache(784.0)    History of abnormal cervical Pap smear 04/02/2014   Hypertension    LLQ abdominal tenderness 05/31/2015   Menorrhagia 01/05/2014   Neuromuscular disorder ([place])    lower back and bilat leg pain   Obesity    Other and unspecified ovarian cyst 01/12/2014   Polyclonal gammopathy 07/26/2013   Insignificant   Recurrent upper respiratory infection (URI)    Scoliosis    Urticaria    Vaginal irritation 09/30/2014  Vaginal Pap smear, abnormal       Past Surgical History:  Procedure Laterality Date   ADENOIDECTOMY     BREAST BIOPSY Left 02/2021   DILATATION AND CURETTAGE/HYSTEROSCOPY WITH MINERVA N/A 03/14/2021   Procedure: HYSTEROSCOPY WITH MINERVA;  Surgeon: Florian Buff, MD;  Location: [facility];  Service: Gynecology;  Laterality: N/A;   DILATION AND CURETTAGE OF UTERUS     LAPAROSCOPIC BILATERAL SALPINGECTOMY Bilateral 03/16/2020   Procedure: LAPAROSCOPIC BILATERAL SALPINGECTOMY;  Surgeon: Florian Buff, MD;  Location: [facility];  Service: Gynecology;  Laterality: Bilateral;   SHOULDER SURGERY Right 03/10/2019   TONSILLECTOMY     TUBAL LIGATION N/A    Phreesia 06/28/2020      Social History:     Social History   Tobacco Use   Smoking status: Never   Smokeless tobacco: Never  Substance Use Topics   Alcohol use: No       Family History :     Family History  Problem Relation Age of Onset   Hypertension Mother    Diabetes Mother    Heart disease Mother    Diabetes Father    Cancer Paternal Aunt        breast   Hypertension Maternal  Grandmother    Diabetes Son        pre diabetic   Other Son        overactive bladder   COPD Maternal Aunt    Leukemia Maternal Aunt    Allergic rhinitis Neg Hx    Angioedema Neg Hx    Asthma Neg Hx    Atopy Neg Hx    Eczema Neg Hx    Immunodeficiency Neg Hx    Urticaria Neg Hx      Home Medications:   Prior to Admission medications   Medication Sig Start Date End Date Taking? Authorizing Provider  acetaminophen (TYLENOL) 500 MG tablet Take 1,000 mg by mouth every 6 (six) hours as needed for mild pain.   Yes [provider]  albuterol (PROVENTIL HFA) 108 (90 Base) MCG/ACT inhaler Inhale 2 puffs into the lungs every 6 (six) hours as needed for wheezing (cough). 09/30/20  Yes [name], MD  amLODipine (NORVASC) 5 MG tablet Take 1 tablet (5 mg total) by mouth daily. 04/19/21  Yes [name], MD  fluticasone (FLONASE) 50 MCG/ACT nasal spray Place 2 sprays into both nostrils daily as needed for allergies or rhinitis.   Yes [provider]  fluticasone-salmeterol (ADVAIR HFA) 115-21 MCG/ACT inhaler Inhale 2 puffs into the lungs 2 (two) times daily. Patient taking differently: Inhale 2 puffs into the lungs daily. 09/30/20  Yes [name], MD  hydrochlorothiazide (MICROZIDE) 12.5 MG capsule Take 1 capsule (12.5 mg total) by mouth daily. TAKE (1) CAPSULE BY MOUTH ONCE EVERY DAY. Strength: 12.5 mg 05/24/21  Yes [name], MD  ketorolac (TORADOL) 10 MG tablet Take 10 mg by mouth every 6 (six) hours as needed.   Yes [provider]  Olopatadine HCl 0.2 % SOLN Use 1 drop in each eye once a day as needed for itchy watery eyes Patient taking differently: Place 1 drop into both eyes every 6 (six) hours as needed (itchy watery eyes). 12/07/20  Yes Althea Charon, FNP  cephALEXin (KEFLEX) 500 MG capsule Take 1 capsule (500 mg total) by mouth 2 (two) times daily. Patient not taking: Reported on 06/28/2021 04/20/21   Mar Daring, PA-C  EPINEPHrine 0.3  mg/0.3 mL IJ SOAJ injection  Inject 0.3 mg into the muscle as needed for anaphylaxis. Patient not taking: Reported on 03/06/2021 12/07/20   Althea Charon, FNP  potassium chloride SA (KLOR-CON) 20 MEQ tablet Take 1 tablet (20 mEq total) by mouth daily. Patient not taking: Reported on 06/28/2021 03/10/21   Florian Buff, MD  zolpidem (AMBIEN) 10 MG tablet TAKE (1) TABLET BY MOUTH AT BEDTIME. Patient not taking: No sig reported 02/28/16 11/12/18  Alycia Rossetti, MD     Allergies:     Allergies  Allergen Reactions   Banana Rash   Pineapple Rash     Physical Exam:   Vitals  Blood pressure 111/80, pulse (!) 131, temperature 99.1 F (37.3 C), temperature source Oral, resp. rate 17, weight 122.5 kg, SpO2 100 %, not currently breastfeeding.   1. General developed female laying in bed in no apparent distress.  2. Normal affect and insight, Not Suicidal or Homicidal, Awake Alert, Oriented X 3.  3. No F.N deficits, ALL C.Nerves Intact, Strength 5/5 all 4 extremities, Sensation intact all 4 extremities, Plantars down going.  4. Ears and Eyes appear Normal, Conjunctivae clear, PERRLA. Moist Oral Mucosa.  5. Supple Neck, No JVD, No cervical lymphadenopathy appriciated, No Carotid Bruits.  6. Symmetrical Chest wall movement, Good air movement bilaterally, CTAB.  7.  Tachycardic, No Gallops, Rubs or Murmurs, No Parasternal Heave.  8. Positive Bowel Sounds, Abdomen Soft, left CVA tenderness, No organomegaly appriciated,No rebound -guarding or rigidity.  9.  No Cyanosis, Normal Skin Turgor, No Skin Rash or Bruise.  10. Good muscle tone,  joints appear normal , no effusions, Normal ROM.  11. No Palpable Lymph Nodes in Neck or Axillae     Data Review:    CBC Recent Labs  Lab 06/28/21 1603  WBC 15.8*  HGB 12.6  HCT 38.4  PLT 275  MCV 89.3  MCH 29.3  MCHC 32.8  RDW 13.8  LYMPHSABS 0.5*  MONOABS 0.2  EOSABS 0.0  BASOSABS 0.0    ------------------------------------------------------------------------------------------------------------------  Chemistries  Recent Labs  Lab 06/28/21 1603  NA 135  K 2.9*  CL 102  CO2 22  GLUCOSE 169*  BUN 13  CREATININE 1.19*  CALCIUM 8.3*  AST 16  ALT 9  ALKPHOS 83  BILITOT 0.9   ------------------------------------------------------------------------------------------------------------------ estimated creatinine clearance is 84.2 mL/min (A) (by C-G formula based on SCr of 1.19 mg/dL (H)). ------------------------------------------------------------------------------------------------------------------ No results for input(s): TSH, T4TOTAL, T3FREE, THYROIDAB in the last 72 hours.  Invalid input(s): FREET3  Coagulation profile Recent Labs  Lab 06/28/21 1603  INR 1.1   ------------------------------------------------------------------------------------------------------------------- No results for input(s): DDIMER in the last 72 hours. -------------------------------------------------------------------------------------------------------------------  Cardiac Enzymes No results for input(s): CKMB, TROPONINI, MYOGLOBIN in the last 168 hours.  Invalid input(s): CK ------------------------------------------------------------------------------------------------------------------ No results found for: BNP   ---------------------------------------------------------------------------------------------------------------  Urinalysis    Component Value Date/Time   COLORURINE YELLOW 06/28/2021 1703   APPEARANCEUR CLOUDY (A) 06/28/2021 1703   LABSPEC 1.021 06/28/2021 1703   PHURINE 6.0 06/28/2021 1703   GLUCOSEU NEGATIVE 06/28/2021 1703   HGBUR MODERATE (A) 06/28/2021 1703   HGBUR small 02/03/2008 1438   BILIRUBINUR NEGATIVE 06/28/2021 1703   BILIRUBINUR small (A) 01/01/2021 1149   BILIRUBINUR negative 01/11/2012 1714   KETONESUR 5 (A) 06/28/2021 1703    PROTEINUR 30 (A) 06/28/2021 1703   UROBILINOGEN 1.0 01/01/2021 1149   UROBILINOGEN 1.0 12/29/2011 2254   NITRITE POSITIVE (A) 06/28/2021 1703   LEUKOCYTESUR LARGE (A) 06/28/2021 1703    ----------------------------------------------------------------------------------------------------------------   Imaging Results:  DG Chest Port 1 View  Result Date: 06/28/2021 CLINICAL DATA:  Fever EXAM: PORTABLE CHEST 1 VIEW COMPARISON:  01/11/2020 FINDINGS: The heart size and mediastinal contours are within normal limits. Both lungs are clear. The visualized skeletal structures are unremarkable. IMPRESSION: No active disease. Electronically Signed   By: Elmer Picker M.D.   On: 06/28/2021 16:34   CT RENAL STONE STUDY  Result Date: 06/28/2021 CLINICAL DATA:  Left-sided flank pain, fever for 3 days EXAM: CT ABDOMEN AND PELVIS WITHOUT CONTRAST TECHNIQUE: Multidetector CT imaging of the abdomen and pelvis was performed following the standard protocol without IV contrast. RADIATION DOSE REDUCTION: This exam was performed according to the departmental dose-optimization program which includes automated exposure control, adjustment of the mA and/or kV according to patient size and/or use of iterative reconstruction technique. COMPARISON:  02/03/2017 FINDINGS: Lower chest: No acute pleural or parenchymal lung disease. Hepatobiliary: Unremarkable unenhanced appearance of the liver and gallbladder. Pancreas: Unremarkable unenhanced appearance. Spleen: Unremarkable unenhanced appearance. Adrenals/Urinary Tract: There is an obstructing 6 mm calculus within the distal left ureter reference image 74/2. There is moderate left-sided hydronephrosis and hydroureter, with left perinephric fat stranding and left renal edema. The right kidney is unremarkable. The adrenals and bladder are unremarkable. Stomach/Bowel: No bowel obstruction or ileus. Scattered sigmoid diverticulosis without diverticulitis. Normal appendix right  lower quadrant. No bowel wall thickening or inflammatory change. Vascular/Lymphatic: No significant vascular findings are present. No enlarged abdominal or pelvic lymph nodes. Reproductive: Uterus and bilateral adnexa are unremarkable. Other: Trace pelvic free fluid likely physiologic. No free intraperitoneal gas. No abdominal wall hernia. Musculoskeletal: No acute or destructive bony lesions. Reconstructed images demonstrate no additional findings. IMPRESSION: 1. Obstructing 6 mm distal left ureteral calculus, with moderate left-sided obstructive uropathy. 2. Trace pelvic free fluid, likely physiologic. Electronically Signed   By: Randa Ngo M.D.   On: 06/28/2021 19:21      Assessment & Plan:    Principal Problem:   Pyelonephritis Active Problems:   Sepsis ([place])   Hydronephrosis   Hypokalemia   Hypertension   Morbid obesity ([place])   Vitamin D deficiency   Sepsis present on admission due to left obstructive hydronephrosis/pyelonephritis -Sepsis present on admission, hypotensive, tachypneic, tachycardic with elevated lactic acid -Evidence of left obstructive uropathy with 6 mm kidney stones causing left hydronephrosis -Known history of ESBL UTI in the past, will start on meropenem -Will follow blood cultures and urine cultures and narrow antibiotics as needed -Continue with aggressive IV hydration, continue to trend lactic acid -We will admit to stepdown. -Discussed with urology Dr. Alyson Ingles, plan for surgical intervention tomorrow.  We will keep n.p.o. after midnight.   Hypokalemia -Repleted, continue to monitor on telemetry  Hypertension -Blood pressure is soft due to sepsis, continue to hold medications  Morbid obesity with BMI of 44.9  Vitamin D  deficiency -Continue with supplements   DVT Prophylaxis Heparin  AM Labs Ordered, also please review Full Orders  Family Communication: Admission, patients condition and plan of care including tests being ordered have been  discussed with the patient and mother  who indicate understanding and agree with the plan and Code Status.  Code Status Full  Likely DC to  home  Condition GUARDED    Consults called: urology Dr Alyson Ingles    Admission status: inpatient    Time spent in minutes : 70 minutes   Phillips Climes M.D on 06/28/2021 at 8:15 PM   [facility] - Office  [phone]

## 2021-06-29 ENCOUNTER — Inpatient Hospital Stay (HOSPITAL_COMMUNITY): Payer: Medicaid Other | Admitting: Certified Registered Nurse Anesthetist

## 2021-06-29 ENCOUNTER — Encounter (HOSPITAL_COMMUNITY): Payer: Self-pay | Admitting: Internal Medicine

## 2021-06-29 ENCOUNTER — Encounter (HOSPITAL_COMMUNITY)
Admission: EM | Disposition: A | Discharge status: Home / Self Care | Payer: Self-pay | Source: Home / Self Care | Attending: Internal Medicine

## 2021-06-29 ENCOUNTER — Inpatient Hospital Stay (HOSPITAL_COMMUNITY): Payer: Medicaid Other

## 2021-06-29 DIAGNOSIS — D649 Anemia, unspecified: Secondary | ICD-10-CM | POA: Diagnosis not present

## 2021-06-29 DIAGNOSIS — A419 Sepsis, unspecified organism: Secondary | ICD-10-CM | POA: Diagnosis not present

## 2021-06-29 DIAGNOSIS — N12 Tubulo-interstitial nephritis, not specified as acute or chronic: Secondary | ICD-10-CM | POA: Diagnosis not present

## 2021-06-29 DIAGNOSIS — I10 Essential (primary) hypertension: Secondary | ICD-10-CM | POA: Diagnosis not present

## 2021-06-29 DIAGNOSIS — N201 Calculus of ureter: Secondary | ICD-10-CM | POA: Diagnosis not present

## 2021-06-29 DIAGNOSIS — J4530 Mild persistent asthma, uncomplicated: Secondary | ICD-10-CM | POA: Diagnosis not present

## 2021-06-29 DIAGNOSIS — N179 Acute kidney failure, unspecified: Secondary | ICD-10-CM

## 2021-06-29 DIAGNOSIS — E876 Hypokalemia: Secondary | ICD-10-CM | POA: Diagnosis not present

## 2021-06-29 DIAGNOSIS — E559 Vitamin D deficiency, unspecified: Secondary | ICD-10-CM

## 2021-06-29 DIAGNOSIS — R6520 Severe sepsis without septic shock: Secondary | ICD-10-CM | POA: Diagnosis not present

## 2021-06-29 DIAGNOSIS — N132 Hydronephrosis with renal and ureteral calculous obstruction: Secondary | ICD-10-CM

## 2021-06-29 HISTORY — PX: CYSTOSCOPY W/ URETERAL STENT PLACEMENT: SHX1429

## 2021-06-29 LAB — BLOOD CULTURE ID PANEL (REFLEXED) - BCID2

## 2021-06-29 LAB — CBC
HCT: 39.1 % (ref 36.0–46.0)
Hemoglobin: 12.3 g/dL (ref 12.0–15.0)
MCH: 29.1 pg (ref 26.0–34.0)
MCHC: 31.5 g/dL (ref 30.0–36.0)
MCV: 92.4 fL (ref 80.0–100.0)
Platelets: 246 10*3/uL (ref 150–400)
RBC: 4.23 MIL/uL (ref 3.87–5.11)
RDW: 14 % (ref 11.5–15.5)
WBC: 33 10*3/uL — ABNORMAL HIGH (ref 4.0–10.5)
nRBC: 0 % (ref 0.0–0.2)

## 2021-06-29 LAB — BASIC METABOLIC PANEL
Anion gap: 10 (ref 5–15)
BUN: 9 mg/dL (ref 6–20)
CO2: 22 mmol/L (ref 22–32)
Calcium: 8.2 mg/dL — ABNORMAL LOW (ref 8.9–10.3)
Chloride: 109 mmol/L (ref 98–111)
Creatinine, Ser: 0.81 mg/dL (ref 0.44–1.00)
GFR, Estimated: >60 mL/min (ref >60)
Glucose, Bld: 86 mg/dL (ref 70–99)
Potassium: 3.5 mmol/L (ref 3.5–5.1)
Sodium: 141 mmol/L (ref 135–145)

## 2021-06-29 SURGERY — CYSTOSCOPY, WITH RETROGRADE PYELOGRAM AND URETERAL STENT INSERTION
Anesthesia: General | Site: Ureter | Laterality: Left

## 2021-06-29 MED ORDER — LIDOCAINE HCL (CARDIAC) PF 100 MG/5ML IV SOSY
PREFILLED_SYRINGE | INTRAVENOUS | Status: DC | PRN
Start: 2021-06-29 — End: 2021-06-29
  Administered 2021-06-29: 50 mg via INTRAVENOUS

## 2021-06-29 MED ORDER — METOCLOPRAMIDE HCL 5 MG/ML IJ SOLN
INTRAMUSCULAR | Status: AC
  Filled 2021-06-29: qty 2

## 2021-06-29 MED ORDER — STERILE WATER FOR IRRIGATION IR SOLN
Status: DC | PRN
  Administered 2021-06-29: 500 mL

## 2021-06-29 MED ORDER — FENTANYL CITRATE (PF) 100 MCG/2ML IJ SOLN
INTRAMUSCULAR | Status: AC
  Filled 2021-06-29: qty 2

## 2021-06-29 MED ORDER — CHLORHEXIDINE GLUCONATE 0.12 % MT SOLN
15.0000 mL | Freq: Once | OROMUCOSAL | Status: AC
  Administered 2021-06-29: 15 mL via OROMUCOSAL

## 2021-06-29 MED ORDER — SODIUM CHLORIDE 0.9 % IR SOLN
Status: DC | PRN
  Administered 2021-06-29 (×2): 3000 mL

## 2021-06-29 MED ORDER — MIDAZOLAM HCL 2 MG/2ML IJ SOLN
INTRAMUSCULAR | Status: AC
  Filled 2021-06-29: qty 2

## 2021-06-29 MED ORDER — DEXAMETHASONE SODIUM PHOSPHATE 10 MG/ML IJ SOLN
INTRAMUSCULAR | Status: DC | PRN
  Administered 2021-06-29: 10 mg via INTRAVENOUS

## 2021-06-29 MED ORDER — MIDAZOLAM HCL 5 MG/5ML IJ SOLN
INTRAMUSCULAR | Status: DC | PRN
  Administered 2021-06-29: 2 mg via INTRAVENOUS

## 2021-06-29 MED ORDER — PROPOFOL 10 MG/ML IV BOLUS
INTRAVENOUS | Status: AC
  Filled 2021-06-29: qty 20

## 2021-06-29 MED ORDER — HYDROMORPHONE HCL 1 MG/ML IJ SOLN: 0.2500 mg | INTRAMUSCULAR | Status: DC | PRN

## 2021-06-29 MED ORDER — EPHEDRINE 5 MG/ML INJ
INTRAVENOUS | Status: AC
  Filled 2021-06-29: qty 5

## 2021-06-29 MED ORDER — DIATRIZOATE MEGLUMINE 30 % UR SOLN
URETHRAL | Status: DC | PRN
  Administered 2021-06-29: 7 mL via URETHRAL

## 2021-06-29 MED ORDER — SODIUM CHLORIDE 0.9 % IV SOLN: INTRAVENOUS | Status: DC

## 2021-06-29 MED ORDER — DIATRIZOATE MEGLUMINE 30 % UR SOLN
URETHRAL | Status: AC
  Filled 2021-06-29: qty 100

## 2021-06-29 MED ORDER — ORAL CARE MOUTH RINSE: 15.0000 mL | Freq: Once | OROMUCOSAL | Status: AC

## 2021-06-29 MED ORDER — ONDANSETRON HCL 4 MG/2ML IJ SOLN: 4.0000 mg | Freq: Once | INTRAMUSCULAR | Status: DC | PRN

## 2021-06-29 MED ORDER — METOCLOPRAMIDE HCL 5 MG/ML IJ SOLN
10.0000 mg | Freq: Once | INTRAMUSCULAR | Status: AC
Start: 2021-06-29 — End: 2021-06-29
  Administered 2021-06-29: 10 mg via INTRAVENOUS

## 2021-06-29 MED ORDER — MEPERIDINE HCL 50 MG/ML IJ SOLN: 6.2500 mg | INTRAMUSCULAR | Status: DC | PRN

## 2021-06-29 MED ORDER — ROPINIROLE HCL 1 MG PO TABS
1.0000 mg | ORAL_TABLET | Freq: Every day | ORAL | Status: DC
  Administered 2021-06-29 – 2021-07-01 (×3): 1 mg via ORAL
  Filled 2021-06-29 (×4): qty 1

## 2021-06-29 MED ORDER — FENTANYL CITRATE (PF) 100 MCG/2ML IJ SOLN
INTRAMUSCULAR | Status: DC | PRN
  Administered 2021-06-29 (×2): 100 ug via INTRAVENOUS

## 2021-06-29 MED ORDER — PROPOFOL 10 MG/ML IV BOLUS
INTRAVENOUS | Status: DC | PRN
Start: 2021-06-29 — End: 2021-06-29
  Administered 2021-06-29: 200 mg via INTRAVENOUS

## 2021-06-29 MED ORDER — LACTATED RINGERS IV SOLN: INTRAVENOUS | Status: DC

## 2021-06-29 MED ORDER — ONDANSETRON HCL 4 MG/2ML IJ SOLN
INTRAMUSCULAR | Status: DC | PRN
  Administered 2021-06-29: 4 mg via INTRAVENOUS

## 2021-06-29 SURGICAL SUPPLY — 22 items
BAG DRAIN URO TABLE W/ADPT NS (BAG) ×2 IMPLANT
BAG HAMPER (MISCELLANEOUS) ×2 IMPLANT
CATH INTERMIT  6FR 70CM (CATHETERS) ×2 IMPLANT
CLOTH BEACON ORANGE TIMEOUT ST (SAFETY) ×2 IMPLANT
DECANTER SPIKE VIAL GLASS SM (MISCELLANEOUS) ×2 IMPLANT
GLOVE SURG POLYISO LF SZ8 (GLOVE) ×2 IMPLANT
GLOVE SURG UNDER POLY LF SZ7 (GLOVE) ×4 IMPLANT
GOWN STRL REUS W/TWL LRG LVL3 (GOWN DISPOSABLE) ×2 IMPLANT
GOWN STRL REUS W/TWL XL LVL3 (GOWN DISPOSABLE) ×2 IMPLANT
GUIDEWIRE STR ZIPWIRE 035X150 (MISCELLANEOUS) ×2 IMPLANT
IV NS IRRIG 3000ML ARTHROMATIC (IV SOLUTION) ×3 IMPLANT
KIT TURNOVER CYSTO (KITS) ×2 IMPLANT
MANIFOLD NEPTUNE II (INSTRUMENTS) ×2 IMPLANT
PACK CYSTO (CUSTOM PROCEDURE TRAY) ×2 IMPLANT
PAD ARMBOARD 7.5X6 YLW CONV (MISCELLANEOUS) ×2 IMPLANT
STENT INLAY 6X26 (STENTS) ×1 IMPLANT
STENT URET 6FRX26 CONTOUR (STENTS) ×1 IMPLANT
SYR 10ML LL (SYRINGE) ×2 IMPLANT
TOWEL OR 17X26 4PK STRL BLUE (TOWEL DISPOSABLE) ×2 IMPLANT
TRAY FOL W/BAG SLVR 16FR STRL (SET/KITS/TRAYS/PACK) IMPLANT
TRAY FOLEY W/BAG SLVR 16FR LF (SET/KITS/TRAYS/PACK) ×2
WATER STERILE IRR 500ML POUR (IV SOLUTION) ×2 IMPLANT

## 2021-06-29 NOTE — Progress Notes (Signed)
PHARMACY - PHYSICIAN COMMUNICATION ?CRITICAL VALUE ALERT - BLOOD CULTURE IDENTIFICATION (BCID) ? ?[name] is an 41 y.o. female who presented to [facility] on 06/28/2021 with a chief complaint of Pyelonephritis. ? ?Assessment:  4 out of 4 blood cultures grew GNR identified as EColi. Currently appropriately treated on Merrem ? ?Name of physician (or Provider) Contacted: Dr. Dyann Kief ? ?Current antibiotics: Merrem 1gm Q 8 hrs ? ?Changes to prescribed antibiotics recommended:  ?Patient is on recommended antibiotics - No changes needed ? ?Results for orders placed or performed during the hospital encounter of 06/28/21  ?Blood Culture ID Panel (Reflexed) (Collected: 06/28/2021  4:03 PM)  ?Result Value Ref Range  ? Enterococcus faecalis NOT DETECTED NOT DETECTED  ? Enterococcus Faecium NOT DETECTED NOT DETECTED  ? Listeria monocytogenes NOT DETECTED NOT DETECTED  ? Staphylococcus species NOT DETECTED NOT DETECTED  ? Staphylococcus aureus (BCID) NOT DETECTED NOT DETECTED  ? Staphylococcus epidermidis NOT DETECTED NOT DETECTED  ? Staphylococcus lugdunensis NOT DETECTED NOT DETECTED  ? Streptococcus species NOT DETECTED NOT DETECTED  ? Streptococcus agalactiae NOT DETECTED NOT DETECTED  ? Streptococcus pneumoniae NOT DETECTED NOT DETECTED  ? Streptococcus pyogenes NOT DETECTED NOT DETECTED  ? A.calcoaceticus-baumannii NOT DETECTED NOT DETECTED  ? Bacteroides fragilis NOT DETECTED NOT DETECTED  ? Enterobacterales DETECTED (A) NOT DETECTED  ? Enterobacter cloacae complex NOT DETECTED NOT DETECTED  ? Escherichia coli DETECTED (A) NOT DETECTED  ? Klebsiella aerogenes NOT DETECTED NOT DETECTED  ? Klebsiella oxytoca NOT DETECTED NOT DETECTED  ? Klebsiella pneumoniae NOT DETECTED NOT DETECTED  ? Proteus species NOT DETECTED NOT DETECTED  ? Salmonella species NOT DETECTED NOT DETECTED  ? Serratia marcescens NOT DETECTED NOT DETECTED  ? Haemophilus influenzae NOT DETECTED NOT DETECTED  ? Neisseria meningitidis NOT DETECTED  NOT DETECTED  ? Pseudomonas aeruginosa NOT DETECTED NOT DETECTED  ? Stenotrophomonas maltophilia NOT DETECTED NOT DETECTED  ? Candida albicans NOT DETECTED NOT DETECTED  ? Candida auris NOT DETECTED NOT DETECTED  ? Candida glabrata NOT DETECTED NOT DETECTED  ? Candida krusei NOT DETECTED NOT DETECTED  ? Candida parapsilosis NOT DETECTED NOT DETECTED  ? Candida tropicalis NOT DETECTED NOT DETECTED  ? Cryptococcus neoformans/gattii NOT DETECTED NOT DETECTED  ? CTX-M ESBL NOT DETECTED NOT DETECTED  ? Carbapenem resistance IMP NOT DETECTED NOT DETECTED  ? Carbapenem resistance KPC NOT DETECTED NOT DETECTED  ? Carbapenem resistance NDM NOT DETECTED NOT DETECTED  ? Carbapenem resist OXA 48 LIKE NOT DETECTED NOT DETECTED  ? Carbapenem resistance VIM NOT DETECTED NOT DETECTED  ? ? ?Ferdinand Lango ?06/29/2021  11:27 AM ? ?

## 2021-06-29 NOTE — Anesthesia Preprocedure Evaluation (Addendum)
Anesthesia Evaluation  ?Patient identified by MRN, date of birth, ID band ?Patient awake ? ? ? ?Reviewed: ?Allergy & Precautions, NPO status , Patient's Chart, lab work & pertinent test results ? ?Airway ?Mallampati: II ? ?TM Distance: >3 FB ?Neck ROM: Full ? ? ? Dental ? ?(+) Dental Advisory Given, Teeth Intact ?  ?Pulmonary ?asthma , Recent URI ,  ?  ?Pulmonary exam normal ?breath sounds clear to auscultation ? ? ? ? ? ? Cardiovascular ?Exercise Tolerance: Good ?hypertension, Pt. on medications ?Normal cardiovascular exam ?Rhythm:Regular Rate:Normal ? ? ?  ?Neuro/Psych ? Headaches, Anxiety  Neuromuscular disease   ? GI/Hepatic ?Neg liver ROS, GERD  Medicated,  ?Endo/Other  ?Morbid obesity ? Renal/GU ?Renal disease  ? ?  ?Musculoskeletal ? ? Abdominal ?  ?Peds ? Hematology ? ?(+) Blood dyscrasia, anemia ,   ?Anesthesia Other Findings ? ? Reproductive/Obstetrics ? ?  ? ? ? ? ? ? ? ? ? ? ? ? ? ?  ?  ? ? ? ? ? ? ? ?Anesthesia Physical ?Anesthesia Plan ? ?ASA: 3 ? ?Anesthesia Plan: General  ? ?Post-op Pain Management: Dilaudid IV  ? ?Induction: Intravenous ? ?PONV Risk Score and Plan: 4 or greater and Ondansetron, Dexamethasone and Metaclopromide ? ?Airway Management Planned: LMA ? ?Additional Equipment:  ? ?Intra-op Plan:  ? ?Post-operative Plan: Extubation in OR ? ?Informed Consent: I have reviewed the patients History and Physical, chart, labs and discussed the procedure including the risks, benefits and alternatives for the proposed anesthesia with the patient or authorized representative who has indicated his/her understanding and acceptance.  ? ? ? ?Dental advisory given ? ?Plan Discussed with: CRNA and Surgeon ? ?Anesthesia Plan Comments:   ? ? ? ? ?Anesthesia Quick Evaluation ? ?

## 2021-06-29 NOTE — Assessment & Plan Note (Addendum)
-  Electrolytes has been repleted has resume home maintenance supplementation. ?-Repeat basic metabolic panel to follow ultralights trend at follow-up visit. ?

## 2021-06-29 NOTE — Transfer of Care (Signed)
Immediate Anesthesia Transfer of Care Note ? ?Patient: [name] ? ?Procedure(s) Performed: CYSTOSCOPY WITH RETROGRADE PYELOGRAM/URETERAL STENT PLACEMENT (Left: Ureter) ? ?Patient Location: PACU ? ?Anesthesia Type:General ? ?Level of Consciousness: awake, alert , oriented and patient cooperative ? ?Airway & Oxygen Therapy: Patient Spontanous Breathing and Patient connected to nasal cannula oxygen ? ?Post-op Assessment: Report given to RN, Post -op Vital signs reviewed and stable and Patient moving all extremities X 4 ? ?Post vital signs: Reviewed and stable ? ?Last Vitals:  ?Vitals Value Taken Time  ?BP 121/76 06/29/21 0941  ?Temp    ?Pulse 116 06/29/21 0942  ?Resp 22 06/29/21 0942  ?SpO2 99 % 06/29/21 0942  ?Vitals shown include unvalidated device data. ? ?Last Pain:  ?Vitals:  ? 06/29/21 0821  ?TempSrc:   ?PainSc: 7   ?   ? ?Patients Stated Pain Goal: 7 (06/29/21 6314) ? ?Complications: No notable events documented. ?

## 2021-06-29 NOTE — Consult Note (Signed)
Urology Consult  Referring physician: Dr. Dyann Kief Reason for referral: left ureteral stone, fever  Chief Complaint: Left flank pain  History of Present Illness: Ms Mcmahan is a 41yo with a history of nephrolithiasis who presented to the ER yesterday with a 2 day history of severe left flank pain and a 1 day history of fever. She has passed several small calculi in over the past 10 years but has never required an intervention. Currently her pain is sharp, intermittent, moderate to severe and nonraditing. She has associated nausea and vomiting. She denies any significant LUTS. No hematuria or dysuria. She has a fever of 101.1 currently. CT stone study shows a 12mm left mid ureteral calculus with moderate hydronephrosis. UA is concerning for infection. WBC count 33.   Past Medical History:  Diagnosis Date   Allergy    banana/pineapple   Anemia    post pregnancy   Anxiety    Asthma    Autoimmune disease, not elsewhere classified(279.49) 2013   Non specific- [facility], abnormal Bone Marrow signal   Breast lump in female 11/28/2012   Has tender round mass at 10-11 0'clock right breast 4 finger breaths from [place] will US   Breast mass, right 08/18/2012   Chlamydia    Chronic breast pain 05/31/2015   Dysmenorrhea 03/23/2014   Dyspareunia 01/05/2014   Elevated prolactin level 02/10/2021   Was 54.5, recheck in December ________   Fibroids, intramural 08/24/2015   GERD (gastroesophageal reflux disease)    Gonorrhea    Headache(784.0)    History of abnormal cervical Pap smear 04/02/2014   Hypertension    LLQ abdominal tenderness 05/31/2015   Menorrhagia 01/05/2014   Neuromuscular disorder ([place])    lower back and bilat leg pain   Obesity    Other and unspecified ovarian cyst 01/12/2014   Polyclonal gammopathy 07/26/2013   Insignificant   Recurrent upper respiratory infection (URI)    Scoliosis    Urticaria    Vaginal irritation 09/30/2014   Vaginal Pap smear, abnormal    Past Surgical  History:  Procedure Laterality Date   ADENOIDECTOMY     BREAST BIOPSY Left 02/2021   DILATATION AND CURETTAGE/HYSTEROSCOPY WITH MINERVA N/A 03/14/2021   Procedure: HYSTEROSCOPY WITH MINERVA;  Surgeon: Florian Buff, MD;  Location: [facility];  Service: Gynecology;  Laterality: N/A;   DILATION AND CURETTAGE OF UTERUS     LAPAROSCOPIC BILATERAL SALPINGECTOMY Bilateral 03/16/2020   Procedure: LAPAROSCOPIC BILATERAL SALPINGECTOMY;  Surgeon: Florian Buff, MD;  Location: [facility];  Service: Gynecology;  Laterality: Bilateral;   SHOULDER SURGERY Right 03/10/2019   TONSILLECTOMY     TUBAL LIGATION N/A    Phreesia 06/28/2020    Medications: I have reviewed the patient's current medications. Allergies:  Allergies  Allergen Reactions   Banana Rash   Pineapple Rash    Family History  Problem Relation Age of Onset   Hypertension Mother    Diabetes Mother    Heart disease Mother    Diabetes Father    Cancer Paternal Aunt        breast   Hypertension Maternal Grandmother    Diabetes Son        pre diabetic   Other Son        overactive bladder   COPD Maternal Aunt    Leukemia Maternal Aunt    Allergic rhinitis Neg Hx    Angioedema Neg Hx    Asthma Neg Hx    Atopy Neg Hx    Eczema  Neg Hx    Immunodeficiency Neg Hx    Urticaria Neg Hx    Social History:  reports that she has never smoked. She has never used smokeless tobacco. She reports that she does not drink alcohol and does not use drugs.  Review of Systems  All other systems reviewed and are negative.  Physical Exam:  Vital signs in last 24 hours: Temp:  [98.1 F (36.7 C)-101.1 F (38.4 C)] 98.5 F (36.9 C) (03/02 0817) Pulse Rate:  [105-140] 105 (03/02 0821) Resp:  [14-30] 18 (03/02 0821) BP: (88-124)/(51-91) 124/75 (03/02 0821) SpO2:  [94 %-100 %] 98 % (03/02 0821) Weight:  [122.5 kg] 122.5 kg (03/02 0109) Physical Exam Vitals reviewed.  Constitutional:      Appearance: Normal appearance.  HENT:     Head:  Normocephalic and atraumatic.     Nose: Nose normal. No congestion.     Mouth/Throat:     Mouth: Mucous membranes are dry.  Eyes:     Extraocular Movements: Extraocular movements intact.     Conjunctiva/sclera: Conjunctivae normal.     Pupils: Pupils are equal, round, and reactive to light.  Cardiovascular:     Rate and Rhythm: Normal rate and regular rhythm.  Pulmonary:     Effort: Pulmonary effort is normal. No respiratory distress.  Abdominal:     General: Abdomen is flat. There is no distension.  Musculoskeletal:        General: No swelling. Normal range of motion.     Cervical back: Normal range of motion and neck supple.  Skin:    General: Skin is warm and dry.  Neurological:     General: No focal deficit present.     Mental Status: She is alert and oriented to person, place, and time.  Psychiatric:        Mood and Affect: Mood normal.        Behavior: Behavior normal.        Thought Content: Thought content normal.    Laboratory Data:  Results for orders placed or performed during the hospital encounter of 06/28/21 (from the past 72 hour(s))  Resp Panel by RT-PCR (Flu A&B, Covid) Nasopharyngeal Swab     Status: None   Collection Time: 06/28/21  3:51 PM   Specimen: Nasopharyngeal Swab; Nasopharyngeal(NP) swabs in vial transport medium  Result Value Ref Range   SARS Coronavirus 2 by RT PCR NEGATIVE NEGATIVE    Comment: (NOTE) SARS-CoV-2 target nucleic acids are NOT DETECTED.  The SARS-CoV-2 RNA is generally detectable in upper respiratory specimens during the acute phase of infection. The lowest concentration of SARS-CoV-2 viral copies this assay can detect is 138 copies/mL. A negative result does not preclude SARS-Cov-2 infection and should not be used as the sole basis for treatment or other patient management decisions. A negative result may occur with  improper specimen collection/handling, submission of specimen other than nasopharyngeal swab, presence of viral  mutation(s) within the areas targeted by this assay, and inadequate number of viral copies(<138 copies/mL). A negative result must be combined with clinical observations, patient history, and epidemiological information. The expected result is Negative.  Fact Sheet for Patients:  EntrepreneurPulse.com.au  Fact Sheet for Healthcare Providers:  IncredibleEmployment.be  This test is no t yet approved or cleared by the [place] FDA and  has been authorized for detection and/or diagnosis of SARS-CoV-2 by FDA under an Emergency Use Authorization (EUA). This EUA will remain  in effect (meaning this test can be used) for the  duration of the COVID-19 declaration under Section 564(b)(1) of the Act, 21 U.S.C.section 360bbb-3(b)(1), unless the authorization is terminated  or revoked sooner.       Influenza A by PCR NEGATIVE NEGATIVE   Influenza B by PCR NEGATIVE NEGATIVE    Comment: (NOTE) The Xpert Xpress SARS-CoV-2/FLU/RSV plus assay is intended as an aid in the diagnosis of influenza from Nasopharyngeal swab specimens and should not be used as a sole basis for treatment. Nasal washings and aspirates are unacceptable for Xpert Xpress SARS-CoV-2/FLU/RSV testing.  Fact Sheet for Patients: EntrepreneurPulse.com.au  Fact Sheet for Healthcare Providers: IncredibleEmployment.be  This test is not yet approved or cleared by the [place] FDA and has been authorized for detection and/or diagnosis of SARS-CoV-2 by FDA under an Emergency Use Authorization (EUA). This EUA will remain in effect (meaning this test can be used) for the duration of the COVID-19 declaration under Section 564(b)(1) of the Act, 21 U.S.C. section 360bbb-3(b)(1), unless the authorization is terminated or revoked.  Performed at [facility], [address], [place], [place] [address]   Comprehensive metabolic panel     Status: Abnormal    Collection Time: 06/28/21  4:03 PM  Result Value Ref Range   Sodium 135 135 - 145 mmol/L   Potassium 2.9 (L) 3.5 - 5.1 mmol/L   Chloride 102 98 - 111 mmol/L   CO2 22 22 - 32 mmol/L   Glucose, Bld 169 (H) 70 - 99 mg/dL    Comment: Glucose reference range applies only to samples taken after fasting for at least 8 hours.   BUN 13 6 - 20 mg/dL   Creatinine, Ser 1.19 (H) 0.44 - 1.00 mg/dL   Calcium 8.3 (L) 8.9 - 10.3 mg/dL   Total Protein 7.4 6.5 - 8.1 g/dL   Albumin 3.4 (L) 3.5 - 5.0 g/dL   AST 16 15 - 41 U/L   ALT 9 0 - 44 U/L   Alkaline Phosphatase 83 38 - 126 U/L   Total Bilirubin 0.9 0.3 - 1.2 mg/dL   GFR, Estimated >60 >60 mL/min    Comment: (NOTE) Calculated using the CKD-EPI Creatinine Equation (2021)    Anion gap 11 5 - 15    Comment: Performed at [facility], [address], [place], [place] [address]  Lactic acid, plasma     Status: Abnormal   Collection Time: 06/28/21  4:03 PM  Result Value Ref Range   Lactic Acid, Venous 2.6 (HH) 0.5 - 1.9 mmol/L    Comment: CRITICAL RESULT CALLED TO, READ BACK BY AND VERIFIED WITH: SHOOK,B ON 06/28/21 AT 1655 BY LOY,C Performed at [facility], [address] [place], [place] [address]   CBC with Differential     Status: Abnormal   Collection Time: 06/28/21  4:03 PM  Result Value Ref Range   WBC 15.8 (H) 4.0 - 10.5 K/uL   RBC 4.30 3.87 - 5.11 MIL/uL   Hemoglobin 12.6 12.0 - 15.0 g/dL   HCT 38.4 36.0 - 46.0 %   MCV 89.3 80.0 - 100.0 fL   MCH 29.3 26.0 - 34.0 pg   MCHC 32.8 30.0 - 36.0 g/dL   RDW 13.8 11.5 - 15.5 %   Platelets 275 150 - 400 K/uL   nRBC 0.0 0.0 - 0.2 %   Neutrophils Relative % 95 %   Neutro Abs 14.9 (H) 1.7 - 7.7 K/uL   Lymphocytes Relative 3 %   Lymphs Abs 0.5 (L) 0.7 - 4.0 K/uL   Monocytes Relative  1 %   Monocytes Absolute 0.2 0.1 - 1.0 K/uL   Eosinophils Relative 0 %   Eosinophils Absolute 0.0 0.0 - 0.5 K/uL   Basophils Relative 0 %   Basophils Absolute 0.0 0.0 - 0.1 K/uL   Immature Granulocytes 1 %   Abs  Immature Granulocytes 0.13 (H) 0.00 - 0.07 K/uL    Comment: Performed at [facility], [address] [place], [place] [address]  Protime-INR     Status: None   Collection Time: 06/28/21  4:03 PM  Result Value Ref Range   Prothrombin Time 14.2 11.4 - 15.2 seconds   INR 1.1 0.8 - 1.2    Comment: (NOTE) INR goal varies based on device and disease states. Performed at [facility], [address], [place], [place] [address]   Culture, blood (Routine x 2)     Status: None (Preliminary result)   Collection Time: 06/28/21  4:03 PM   Specimen: Right Antecubital; Blood  Result Value Ref Range   Specimen Description      RIGHT ANTECUBITAL Performed at [facility] [place], [place] [address]    Special Requests      BOTTLES DRAWN AEROBIC AND ANAEROBIC Blood Culture adequate volume Performed at [facility], [address], [place], [place] [address]    Culture  Setup Time      BOTTLES DRAWN AEROBIC AND ANAEROBIC GRAM NEGATIVE RODS Gram Stain Report Called to,Read Back By and Verified With: BROWN, B 3.2.23 BY MATTHEWS, B 0555 Organism ID to follow Performed at [facility], [place] [address], [place], [place] [address]    Culture GRAM NEGATIVE RODS    Report Status PENDING   Culture, blood (Routine x 2)     Status: None (Preliminary result)   Collection Time: 06/28/21  4:04 PM   Specimen: BLOOD RIGHT HAND  Result Value Ref Range   Specimen Description BLOOD RIGHT HAND    Special Requests      BOTTLES DRAWN AEROBIC AND ANAEROBIC Blood Culture adequate volume   Culture  Setup Time      BOTTLES DRAWN AEROBIC AND ANAEROBIC GRAM NEGATIVE RODS Gram Stain Report Called to,Read Back By and Verified With: BROWN,B@0555  BY MATTHEWS, B3.2.23   Culture      BOTTLES DRAWN AEROBIC AND ANAEROBIC Performed at [facility], [address] [place], [place] [address]    Report Status PENDING   hCG, serum, qualitative     Status: None   Collection Time: 06/28/21  4:05 PM  Result Value  Ref Range   Preg, Serum NEGATIVE NEGATIVE    Comment:        THE SENSITIVITY OF THIS METHODOLOGY IS >10 mIU/mL. Performed at [facility], [address], [place], [place] [address]   Urinalysis, Routine w reflex microscopic Urine, In & Out Cath     Status: Abnormal   Collection Time: 06/28/21  5:03 PM  Result Value Ref Range   Color, Urine YELLOW YELLOW   APPearance CLOUDY (A) CLEAR   Specific Gravity, Urine 1.021 1.005 - 1.030   pH 6.0 5.0 - 8.0   Glucose, UA NEGATIVE NEGATIVE mg/dL   Hgb urine dipstick MODERATE (A) NEGATIVE   Bilirubin Urine NEGATIVE NEGATIVE   Ketones, ur 5 (A) NEGATIVE mg/dL   Protein, ur 30 (A) NEGATIVE mg/dL   Nitrite POSITIVE (A) NEGATIVE   Leukocytes,Ua LARGE (A) NEGATIVE   RBC / HPF 11-20 0 - 5 RBC/hpf   WBC, UA >50 (H) 0 - 5  WBC/hpf   Bacteria, UA MANY (A) NONE SEEN   Squamous Epithelial / LPF 0-5 0 - 5   WBC Clumps PRESENT    Mucus PRESENT     Comment: Performed at [facility], [address] [place], [place] [address]  Lactic acid, plasma     Status: Abnormal   Collection Time: 06/28/21  6:05 PM  Result Value Ref Range   Lactic Acid, Venous 2.5 (HH) 0.5 - 1.9 mmol/L    Comment: CRITICAL VALUE NOTED.  VALUE IS CONSISTENT WITH PREVIOUSLY REPORTED AND CALLED VALUE. Performed at [facility], [address], [place], [place] [address]   CBC     Status: Abnormal   Collection Time: 06/29/21  4:29 AM  Result Value Ref Range   WBC 33.0 (H) 4.0 - 10.5 K/uL   RBC 4.23 3.87 - 5.11 MIL/uL   Hemoglobin 12.3 12.0 - 15.0 g/dL   HCT 39.1 36.0 - 46.0 %   MCV 92.4 80.0 - 100.0 fL   MCH 29.1 26.0 - 34.0 pg   MCHC 31.5 30.0 - 36.0 g/dL   RDW 14.0 11.5 - 15.5 %   Platelets 246 150 - 400 K/uL   nRBC 0.0 0.0 - 0.2 %    Comment: Performed at [facility], [address], [place], [place] [address]  Basic metabolic panel     Status: Abnormal   Collection Time: 06/29/21  4:29 AM  Result Value Ref Range   Sodium 141 135 - 145 mmol/L   Potassium 3.5 3.5 - 5.1  mmol/L    Comment: DELTA CHECK NOTED   Chloride 109 98 - 111 mmol/L   CO2 22 22 - 32 mmol/L   Glucose, Bld 86 70 - 99 mg/dL    Comment: Glucose reference range applies only to samples taken after fasting for at least 8 hours.   BUN 9 6 - 20 mg/dL   Creatinine, Ser 0.81 0.44 - 1.00 mg/dL   Calcium 8.2 (L) 8.9 - 10.3 mg/dL   GFR, Estimated >60 >60 mL/min    Comment: (NOTE) Calculated using the CKD-EPI Creatinine Equation (2021)    Anion gap 10 5 - 15    Comment: Performed at [facility], [address], [place], [place] [address]   Recent Results (from the past 240 hour(s))  Resp Panel by RT-PCR (Flu A&B, Covid) Nasopharyngeal Swab     Status: None   Collection Time: 06/28/21  3:51 PM   Specimen: Nasopharyngeal Swab; Nasopharyngeal(NP) swabs in vial transport medium  Result Value Ref Range Status   SARS Coronavirus 2 by RT PCR NEGATIVE NEGATIVE Final    Comment: (NOTE) SARS-CoV-2 target nucleic acids are NOT DETECTED.  The SARS-CoV-2 RNA is generally detectable in upper respiratory specimens during the acute phase of infection. The lowest concentration of SARS-CoV-2 viral copies this assay can detect is 138 copies/mL. A negative result does not preclude SARS-Cov-2 infection and should not be used as the sole basis for treatment or other patient management decisions. A negative result may occur with  improper specimen collection/handling, submission of specimen other than nasopharyngeal swab, presence of viral mutation(s) within the areas targeted by this assay, and inadequate number of viral copies(<138 copies/mL). A negative result must be combined with clinical observations, patient history, and epidemiological information. The expected result is Negative.  Fact Sheet for Patients:  EntrepreneurPulse.com.au  Fact Sheet for Healthcare Providers:  IncredibleEmployment.be  This test is no t yet approved or cleared by the [place]  and  has been authorized for detection and/or diagnosis of SARS-CoV-2 by FDA under an Emergency Use Authorization (EUA). This EUA will remain  in effect (meaning this test can be used) for the duration of the COVID-19 declaration under Section 564(b)(1) of the Act, 21 U.S.C.section 360bbb-3(b)(1), unless the authorization is terminated  or revoked sooner.       Influenza A by PCR NEGATIVE NEGATIVE Final   Influenza B by PCR NEGATIVE NEGATIVE Final    Comment: (NOTE) The Xpert Xpress SARS-CoV-2/FLU/RSV plus assay is intended as an aid in the diagnosis of influenza from Nasopharyngeal swab specimens and should not be used as a sole basis for treatment. Nasal washings and aspirates are unacceptable for Xpert Xpress SARS-CoV-2/FLU/RSV testing.  Fact Sheet for Patients: EntrepreneurPulse.com.au  Fact Sheet for Healthcare Providers: IncredibleEmployment.be  This test is not yet approved or cleared by the [place] FDA and has been authorized for detection and/or diagnosis of SARS-CoV-2 by FDA under an Emergency Use Authorization (EUA). This EUA will remain in effect (meaning this test can be used) for the duration of the COVID-19 declaration under Section 564(b)(1) of the Act, 21 U.S.C. section 360bbb-3(b)(1), unless the authorization is terminated or revoked.  Performed at [facility], [address] [place], [place] [address]   Culture, blood (Routine x 2)     Status: None (Preliminary result)   Collection Time: 06/28/21  4:03 PM   Specimen: Right Antecubital; Blood  Result Value Ref Range Status   Specimen Description   Final    RIGHT ANTECUBITAL Performed at [facility], [address] [place], [place] [address]    Special Requests   Final    BOTTLES DRAWN AEROBIC AND ANAEROBIC Blood Culture adequate volume Performed at [facility], [address] [place], [place] [address]    Culture  Setup Time   Final    BOTTLES DRAWN AEROBIC  AND ANAEROBIC GRAM NEGATIVE RODS Gram Stain Report Called to,Read Back By and Verified With: BROWN, B 3.2.23 BY MATTHEWS, B 0555 Organism ID to follow Performed at [facility], [place] [address], [place], [place] [address]    Culture GRAM NEGATIVE RODS  Final   Report Status PENDING  Incomplete  Culture, blood (Routine x 2)     Status: None (Preliminary result)   Collection Time: 06/28/21  4:04 PM   Specimen: BLOOD RIGHT HAND  Result Value Ref Range Status   Specimen Description BLOOD RIGHT HAND  Final   Special Requests   Final    BOTTLES DRAWN AEROBIC AND ANAEROBIC Blood Culture adequate volume   Culture  Setup Time   Final    BOTTLES DRAWN AEROBIC AND ANAEROBIC GRAM NEGATIVE RODS Gram Stain Report Called to,Read Back By and Verified With: BROWN,B@0555  BY MATTHEWS, B3.2.23   Culture   Final    BOTTLES DRAWN AEROBIC AND ANAEROBIC Performed at [facility], [address], [place],  [address]    Report Status PENDING  Incomplete   Creatinine: Recent Labs    06/28/21 1603 06/29/21 0429  CREATININE 1.19* 0.81   Baseline Creatinine: 0.8  Impression/Assessment:  40yo with a left ureteral stone, sepsis  Plan:  -We discussed the management of kidney stones. These options include observation, ureteroscopy, shockwave lithotripsy (ESWL) and percutaneous nephrolithotomy (PCNL). We discussed which options are relevant to the patient's stone(s). We discussed the natural history of kidney stones as well as the complications of untreated stones and the impact on quality of life without treatment as well as with each  of the above listed treatments. We also discussed the efficacy of each treatment in its ability to clear the stone burden. With any of these management options I discussed the signs and symptoms of infection and the need for emergent treatment should these be experienced. For each option we discussed the ability of each procedure to clear the patient of their stone burden.    For observation I described the risks which include but are not limited to silent renal damage, life-threatening infection, need for emergent surgery, failure to pass stone and pain.   For ureteroscopy I described the risks which include bleeding, infection, damage to contiguous structures, positioning injury, ureteral stricture, ureteral avulsion, ureteral injury, need for prolonged ureteral stent, inability to perform ureteroscopy, need for an interval procedure, inability to clear stone burden, stent discomfort/pain, heart attack, stroke, pulmonary embolus and the inherent risks with general anesthesia.   For shockwave lithotripsy I described the risks which include arrhythmia, kidney contusion, kidney hemorrhage, need for transfusion, pain, inability to adequately break up stone, inability to pass stone fragments, Steinstrasse, infection associated with obstructing stones, need for alternate surgical procedure, need for repeat shockwave lithotripsy, MI, CVA, PE and the inherent risks with anesthesia/conscious sedation.   For PCNL I described the risks including positioning injury, pneumothorax, hydrothorax, need for chest tube, inability to clear stone burden, renal laceration, arterial venous fistula or malformation, need for embolization of kidney, loss of kidney or renal function, need for repeat procedure, need for prolonged nephrostomy tube, ureteral avulsion, MI, CVA, PE and the inherent risks of general anesthesia.   - The patient would like to proceed with left ureteral stent placement  Nicolette Bang 06/29/2021, 8:45 AM

## 2021-06-29 NOTE — Progress Notes (Signed)
?  Transition of Care (TOC) Screening Note ? ? ?Patient Details  ?Name: [name] ?Date of Birth: 31-Dec-1980 ? ? ?Transition of Care (TOC) CM/SW Contact:    ?Boneta Lucks, RN ?Phone Number: ?06/29/2021, 1:31 PM ? ? ? ?Transition of Care Department [facility]) has reviewed patient and no TOC needs have been identified at this time. We will continue to monitor patient advancement through interdisciplinary progression rounds. If new patient transition needs arise, please place a TOC consult. ? ? ?

## 2021-06-29 NOTE — Assessment & Plan Note (Signed)
Continue vitamin D supplementation 

## 2021-06-29 NOTE — Assessment & Plan Note (Addendum)
-  With prior history of ESBL and images demonstrating left sided obstructive uropathy and hydronephrosis. ?-Urology service has been consulted and patient is status post cystoscopy with stent placement. ?-Continue supportive care. ?-Cultures results demonstrating E. coli only resistant to ampicillin this time. ?-Will continue as needed analgesics, antiemetics and antibiotic has been successfully transitioned to oral cefdinir with intention to treat for 11 more days at discharge. ?-Definitive treatment for her nephrolithiasis and recent stent placement to happen 2 weeks after discharge with a schedule ureteroscopy and stone extraction. ?

## 2021-06-29 NOTE — Assessment & Plan Note (Addendum)
-  In the setting of urosepsis and postrenal process with obstructive uropathy/hydronephrosis. ?-Status post stent placement by urology service ?-Continue treatment with antibiotics for UTI and maintain adequate hydration. ?-Repeat basic metabolic panel follow-up visit to assess renal function and stability. ?-Creatinine back to normal at discharge. ? ? ? ?

## 2021-06-29 NOTE — Anesthesia Procedure Notes (Signed)
Procedure Name: LMA Insertion ?Date/Time: 06/29/2021 9:13 AM ?Performed by: Jonna Munro, CRNA ?Pre-anesthesia Checklist: Patient identified, Emergency Drugs available, Suction available, Patient being monitored and Timeout performed ?Patient Re-evaluated:Patient Re-evaluated prior to induction ?Oxygen Delivery Method: Circle system utilized ?Preoxygenation: Pre-oxygenation with 100% oxygen ?Induction Type: IV induction ?LMA: LMA inserted ?LMA Size: 4.0 ?Number of attempts: 1 ?Placement Confirmation: positive ETCO2 and breath sounds checked- equal and bilateral ?Tube secured with: Tape ?Dental Injury: Teeth and Oropharynx as per pre-operative assessment  ? ? ? ? ?

## 2021-06-29 NOTE — Assessment & Plan Note (Addendum)
-  Overall stable and well-controlled. ?-Advised to follow heart healthy diet ?-Resume home antihypertensive agents. ?

## 2021-06-29 NOTE — Op Note (Signed)
.  Preoperative diagnosis: Left ureteral stone, sepsis ? ?Postoperative diagnosis: Same ? ?Procedure: 1 cystoscopy ?2. Left retrograde pyelography ?3.  Intraoperative fluoroscopy, under one hour, with interpretation ?4. Left 6 x 26 JJ stent placement ? ?Attending: Nicolette Bang ? ?Anesthesia: General ? ?Estimated blood loss: None ? ?Drains: Left 6 x 26 JJ ureteral stent without tether, 16 French foley catheter ? ?Specimens: none ? ?Antibiotics: rocephin ? ?Findings: left mid ureteral stone. Moderate hydronephrosis. No masses/lesions in the bladder. Ureteral orifices in normal anatomic location. ? ?Indications: Patient is a 41 year old female with a history of left ureteral stone and concern for sepsis.  After discussing treatment options, they decided proceed with left stent placement. ? ?Procedure in detail: The patient was brought to the operating room and a brief timeout was done to ensure correct patient, correct procedure, correct site.  General anesthesia was administered patient was placed in dorsal lithotomy position.  Their genitalia was then prepped and draped in usual sterile fashion.  A rigid 48 French cystoscope was passed in the urethra and the bladder.  Bladder was inspected free masses or lesions.  the ureteral orifices were in the normal orthotopic locations.  a 6 french ureteral catheter was then instilled into the left ureteral orifice.  a gentle retrograde was obtained and findings noted above.  we then placed a zip wire through the ureteral catheter and advanced up to the renal pelvis.    We then placed a 6 x 26 double-j ureteral stent over the original zip wire.  We then removed the wire and good coil was noted in the the renal pelvis under fluoroscopy and the bladder under direct vision.  A foley catheter was then placed. the bladder was then drained and this concluded the procedure which was well tolerated by patient. ? ?Complications: None ? ?Condition: Stable, extubated, transferred to  PACU ?She will have her stone extraction in 2 weeks. ? ?

## 2021-06-29 NOTE — Assessment & Plan Note (Addendum)
-  Patient met sepsis criteria at time of admission secondary to UTI/pyelonephritis and E. coli bacteremia; patient with presence of hypotension, tachypnea, tachycardia, elevated WBCs and elevated lactic acid. ?-Continue to maintain adequate hydration ?-Continue treatment with antibiotics using cefdinir for 11 more days. ?-Patient will follow-up with urology service as an outpatient. ?

## 2021-06-29 NOTE — Anesthesia Postprocedure Evaluation (Signed)
Anesthesia Post Note ? ?Patient: [name] ? ?Procedure(s) Performed: CYSTOSCOPY WITH RETROGRADE PYELOGRAM/URETERAL STENT PLACEMENT (Left: Ureter) ? ?Patient location during evaluation: PACU ?Anesthesia Type: General ?Level of consciousness: awake and alert and oriented ?Pain management: pain level controlled ?Vital Signs Assessment: post-procedure vital signs reviewed and stable ?Respiratory status: spontaneous breathing, nonlabored ventilation and respiratory function stable ?Cardiovascular status: blood pressure returned to baseline and stable ?Postop Assessment: no apparent nausea or vomiting ?Anesthetic complications: no ? ? ?No notable events documented. ? ? ?Last Vitals:  ?Vitals:  ? 06/29/21 1000 06/29/21 1100  ?BP: 123/82 122/86  ?Pulse: (!) 107 (!) 104  ?Resp: (!) 22 20  ?Temp: 36.9 ?C 36.8 ?C  ?SpO2: 95% 98%  ?  ?Last Pain:  ?Vitals:  ? 06/29/21 1100  ?TempSrc: Oral  ?PainSc: 3   ? ? ?  ?  ?  ?  ?  ?  ? ?Latrell Reitan C Li Fragoso ? ? ? ? ?

## 2021-06-29 NOTE — Assessment & Plan Note (Signed)
-  Body mass index is 44.93 kg/m?. ?-Low calorie diet, portion control and increase physical activity discussed with patient. ?

## 2021-06-29 NOTE — Progress Notes (Signed)
?Progress Note ? ? ?PatientThara Searing Kaufman OEV:[id number] DOB: [birth year] DOA: 06/28/2021     1 ?DOS: the patient was seen and examined on 06/29/2021 ?  ?Brief hospital admission narrative ?As per H&P written by Dr. Waldron Labs on 06/28/2021 ?[name]  is a 41 y.o. female, past medical history of hypertension, anemia, asthma, GERD, class III obesity, recurrent UTIs (with ESBL infection on December of last year); who presented to the ED secondary to fever of 104, dysuria and left flank pain, radiating into her left lateral abdomen, she does report nausea, but no vomiting, reports symptoms resembles previous UTIs.  Patient reports history of kidney stones in the past which did not require any intervention, she denies any hemoptysis, dyspnea, chest pain, hematuria, focal weakness, or any other complaints.. ?-In ED patient was noted to be hypotensive, tachycardic despite IV fluid resuscitation, lactic acid elevated at 2.6, potassium low at 2.9, creatinine at 1.19, leukocytosis at 15 K, she had positive UA and CT renal stone protocol with significant Obstructing 6 mm distal left ureteral calculus, with moderate left-sided obstructive uropathy and hydronephrosis. Blood cultures were sent, urine cultures were sent, she was started on meropenem due to history of ESBL UTI in the past. ? ? ?Assessment and Plan: ?* Pyelonephritis ?-With prior history of ESBL and images demonstrating left sided obstructive uropathy and hydronephrosis. ?-Continue current IV antibiotics ?-Urology service has been consulted and patient is status post cystoscopy with stent placement. ?-Continue IV fluids, as needed analgesics and supportive care. ?-Follow culture results and sensitivity.      ?-Definitive treatment and stone removal anticipated to be done 2 weeks from now. ? ?Sepsis ([place]) ?-Patient met sepsis criteria at time of admission secondary to UTI/pyelonephritis and the presence of hypotension, tachypnea, tachycardia, elevated  WBCs and elevated lactic acid. ?-We will continue fluid resuscitation ?-Continue current IV antibiotic ?-Follow culture results and sensitivity. ?-Continue supportive care. ?-Patient responding appropriately sepsis features resolving as expected. ? ?Vitamin D deficiency ?-Continue vitamin D supplementation ? ?Hypokalemia ?-Repleted and within normal limits at this time ?-Continue to follow electrolytes trend. ? ?AKI (acute kidney injury) ([place]) ?-In the setting of urosepsis and postrenal process with obstructive uropathy/hydronephrosis. ?-Status post stenting placement by urology service ?-Continue treatment with antibiotics and fluid resuscitation ?-Minimize nephrotoxic agents. ?-Repeat creatinine today down to baseline of 0.81. ?-Patient's creatinine peaked at 1.19 at time of admission. ? ?Hypertension ?-Overall stable ?-Continue holding antihypertensive agents while actively providing fluid resuscitation and treating acute sepsis in the setting of UTI. ? ?Morbid obesity ([place]) ?-Body mass index is 44.93 kg/m?. ?-Low calorie diet, portion control and increase physical activity discussed with patient. ? ? ? ?Subjective:  ?Afebrile, no chest pain, reports no nausea or vomiting currently.  Still having some flank discomfort. ? ?Physical Exam: ?Vitals:  ? 06/29/21 0945 06/29/21 1000 06/29/21 1100 06/29/21 1357  ?BP: 108/82 123/82 122/86 128/85  ?Pulse: (!) 111 (!) 107 (!) 104 (!) 108  ?Resp: (!) 26 (!) _0 ?Temp:  98.4 ?F (36.9 ?C) 98.3 ?F (36.8 ?C) 98.9 ?F (37.2 ?C)  ?TempSrc:   Oral Oral  ?SpO2: 95% 95% 98% 99%  ?Weight:      ?Height:      ? ?General exam: Alert, awake, oriented x 3; in no major distress.  Still having some flank pain.  Denies chest pain, shortness of breath, nausea and vomiting. ?Respiratory system: Clear to auscultation. Respiratory effort normal.  Good saturation on room air. ?Cardiovascular system:RRR. No murmurs, rubs, gallops.  No JVD. ?Gastrointestinal system: Abdomen is obese,  nondistended, soft and left flank tenderness on palpation.. No organomegaly or masses felt. Normal bowel sounds heard. ?Central nervous system: Alert and oriented. No focal neurological deficits. ?Extremities: No cyanosis or clubbing. ?Skin: No petechiae. ?Psychiatry: Judgement and insight appear normal. Mood & affect appropriate.  ? ? ?Data Reviewed: ?-WBCs 33,000 ?-Normal hemoglobin and platelet count. ?-Potassium 3.5, sodium 141 and creatinine back to baseline (0.81). ? ?Family Communication: No family at bedside.  Patient's mother has been updated over the phone while examining patient. ? ?Disposition: ?Status is: Inpatient ?Remains inpatient appropriate because: Need for IV antibiotics. ? ? ? Planned Discharge Destination: Home ? ?Author: ?Barton Dubois, MD ?06/29/2021 2:13 PM ? ?For on call review [email or web address]  ? ?

## 2021-06-30 ENCOUNTER — Other Ambulatory Visit: Payer: Self-pay | Admitting: Urology

## 2021-06-30 ENCOUNTER — Ambulatory Visit: Payer: Medicaid Other | Admitting: Internal Medicine

## 2021-06-30 DIAGNOSIS — E876 Hypokalemia: Secondary | ICD-10-CM | POA: Diagnosis not present

## 2021-06-30 DIAGNOSIS — E559 Vitamin D deficiency, unspecified: Secondary | ICD-10-CM | POA: Diagnosis not present

## 2021-06-30 DIAGNOSIS — N179 Acute kidney failure, unspecified: Secondary | ICD-10-CM | POA: Diagnosis not present

## 2021-06-30 DIAGNOSIS — I10 Essential (primary) hypertension: Secondary | ICD-10-CM | POA: Diagnosis not present

## 2021-06-30 DIAGNOSIS — A419 Sepsis, unspecified organism: Secondary | ICD-10-CM | POA: Diagnosis not present

## 2021-06-30 DIAGNOSIS — N200 Calculus of kidney: Secondary | ICD-10-CM

## 2021-06-30 DIAGNOSIS — N201 Calculus of ureter: Secondary | ICD-10-CM | POA: Diagnosis not present

## 2021-06-30 DIAGNOSIS — N132 Hydronephrosis with renal and ureteral calculous obstruction: Secondary | ICD-10-CM | POA: Diagnosis not present

## 2021-06-30 DIAGNOSIS — N12 Tubulo-interstitial nephritis, not specified as acute or chronic: Secondary | ICD-10-CM | POA: Diagnosis not present

## 2021-06-30 DIAGNOSIS — R6520 Severe sepsis without septic shock: Secondary | ICD-10-CM | POA: Diagnosis not present

## 2021-06-30 LAB — BASIC METABOLIC PANEL
Anion gap: 7 (ref 5–15)
BUN: 10 mg/dL (ref 6–20)
CO2: 22 mmol/L (ref 22–32)
Calcium: 8.1 mg/dL — ABNORMAL LOW (ref 8.9–10.3)
Chloride: 108 mmol/L (ref 98–111)
Creatinine, Ser: 0.74 mg/dL (ref 0.44–1.00)
GFR, Estimated: >60 mL/min (ref >60)
Glucose, Bld: 143 mg/dL — ABNORMAL HIGH (ref 70–99)
Potassium: 3.4 mmol/L — ABNORMAL LOW (ref 3.5–5.1)
Sodium: 137 mmol/L (ref 135–145)

## 2021-06-30 LAB — CBC
HCT: 35.3 % — ABNORMAL LOW (ref 36.0–46.0)
Hemoglobin: 11.2 g/dL — ABNORMAL LOW (ref 12.0–15.0)
MCH: 28.6 pg (ref 26.0–34.0)
MCHC: 31.7 g/dL (ref 30.0–36.0)
MCV: 90.3 fL (ref 80.0–100.0)
Platelets: 254 10*3/uL (ref 150–400)
RBC: 3.91 MIL/uL (ref 3.87–5.11)
RDW: 13.8 % (ref 11.5–15.5)
WBC: 27.6 10*3/uL — ABNORMAL HIGH (ref 4.0–10.5)
nRBC: 0 % (ref 0.0–0.2)

## 2021-06-30 MED ORDER — POLYETHYLENE GLYCOL 3350 17 G PO PACK
17.0000 g | PACK | Freq: Every day | ORAL | Status: DC
  Administered 2021-07-01: 17 g via ORAL
  Filled 2021-06-30 (×2): qty 1

## 2021-06-30 MED ORDER — POTASSIUM CHLORIDE CRYS ER 20 MEQ PO TBCR
40.0000 meq | EXTENDED_RELEASE_TABLET | Freq: Once | ORAL | Status: AC
  Administered 2021-06-30: 40 meq via ORAL
  Filled 2021-06-30: qty 2

## 2021-06-30 MED ORDER — ONDANSETRON HCL 4 MG/2ML IJ SOLN
4.0000 mg | Freq: Four times a day (QID) | INTRAMUSCULAR | Status: DC | PRN
  Administered 2021-06-30: 4 mg via INTRAVENOUS
  Filled 2021-06-30: qty 2

## 2021-06-30 MED ORDER — METOCLOPRAMIDE HCL 5 MG/ML IJ SOLN
10.0000 mg | Freq: Once | INTRAMUSCULAR | Status: AC
  Administered 2021-06-30: 10 mg via INTRAVENOUS
  Filled 2021-06-30: qty 2

## 2021-06-30 NOTE — Assessment & Plan Note (Addendum)
-  Status post left ureteral stent placement ?-Appreciate urology service recommendations and assistance. ?-Per plan she will be scheduled for ureteroscopy and a stone extraction in 2 weeks. ?

## 2021-06-30 NOTE — Progress Notes (Signed)
Surgical Physician Order [facility] [place] ? ?* Scheduling expectation :  07/27/2021 ? ?*Length of Case: 30 minutes ? ?*MD Preforming Case: Nicolette Bang, MD ? ?*Assistant Needed: no ? ?*Facility Preference: [facility] ? ?*Clearance needed: no ? ?*Anticoagulation Instructions: Hold all anticoagulants ? ?*Aspirin Instructions: Ok to continue Aspirin ? ?-Admit type: OUTpatient ? ?-Anesthesia: General ? ?-Use Standing Orders:  NA ? ?*Diagnosis: Left Nephrolithiasis ? ?*Procedure: left  Ureteroscopy w/laser lithotripsy & stent exchange (67672) ? ?Additional orders: N/A ? ?-Equipment: C-Arm holmium laser ?-VTE Prophylaxis Standing Order SCD?s    ?   ?Other:  ? ?-Standing Lab Orders Per Anesthesia   ? ?Lab other: None ? ?-Standing Test orders EKG/Chest x-ray per Anesthesia      ? ?Test other:  ? ?- Medications:   rocephin 2 g ? ?-Other orders:  Ok to proceed with Ancef PCN allergy reviewed ? ?*Post-op visit Date/Instructions:  1 week cysto stent removal  ?

## 2021-06-30 NOTE — Progress Notes (Signed)
?Progress Note ? ? ?Patient: [name] QIW:[id number] DOB: 1980-11-28 DOA: 06/28/2021     2 ?DOS: the patient was seen and examined on 06/30/2021 ?  ?Brief hospital admission narrative ?As per H&P written by Dr. Waldron Labs on 06/28/2021 ?[name]  is a 41 y.o. female, past medical history of hypertension, anemia, asthma, GERD, class III obesity, recurrent UTIs (with ESBL infection on December of last year); who presented to the ED secondary to fever of 104, dysuria and left flank pain, radiating into her left lateral abdomen, she does report nausea, but no vomiting, reports symptoms resembles previous UTIs.  Patient reports history of kidney stones in the past which did not require any intervention, she denies any hemoptysis, dyspnea, chest pain, hematuria, focal weakness, or any other complaints.. ?-In ED patient was noted to be hypotensive, tachycardic despite IV fluid resuscitation, lactic acid elevated at 2.6, potassium low at 2.9, creatinine at 1.19, leukocytosis at 15 K, she had positive UA and CT renal stone protocol with significant Obstructing 6 mm distal left ureteral calculus, with moderate left-sided obstructive uropathy and hydronephrosis. Blood cultures were sent, urine cultures were sent, she was started on meropenem due to history of ESBL UTI in the past. ? ? ?Assessment and Plan: ?* Pyelonephritis ?-With prior history of ESBL and images demonstrating left sided obstructive uropathy and hydronephrosis. ?-Urology service has been consulted and patient is status post cystoscopy with stent placement. ?-Continue IV fluids, as needed analgesics and supportive care. ?-Follow culture results and sensitivity.      ?-Definitive treatment and stone removal anticipated to be done 2 weeks from now. ?-Patient's blood cultures positive for gram-negative rods; awaiting speciation and sensitivity.  We will continue current antibiotics. ?-Continue IV meropenem. ? ?Hydronephrosis ?- Status post left  ureteral stent placement ?-Urology service on board; will follow recommendation. ?-Per plan she will be scheduled for ureteroscopy and a stone extraction in 2 weeks. ? ?Sepsis ([place]) ?-Patient met sepsis criteria at time of admission secondary to UTI/pyelonephritis and gram-negative rods bacteremia; patient with presence of hypotension, tachypnea, tachycardia, elevated WBCs and elevated lactic acid. ?-Will continue fluid resuscitation and Continue current IV antibiotic ?-Follow culture results and sensitivity. ?-Continue supportive care. ?-Patient responding appropriately sepsis features continue resolving as expected. ? ?Vitamin D deficiency ?-Continue vitamin D supplementation ? ?Hypokalemia ?-Continue to follow electrolytes and further replete as needed. ?-Potassium this morning 3.4 ? ?AKI (acute kidney injury) ([place]) ?-In the setting of urosepsis and postrenal process with obstructive uropathy/hydronephrosis. ?-Status post stenting placement by urology service ?-Continue treatment with antibiotics and fluid resuscitation ?-Minimize nephrotoxic agents. ?-Repeat creatinine today down to baseline of 0.74.  We will continue to follow intermittently. ?-Patient's creatinine peaked at 1.19 at time of admission. ? ?Hypertension ?-Overall stable and well-controlled. ?-Continue holding antihypertensive agents while actively providing fluid resuscitation and treating acute sepsis in the setting of UTI. ?-Continue to follow vital signs. ? ?Morbid obesity ([place]) ?-Body mass index is 44.93 kg/m?. ?-Low calorie diet, portion control and increase physical activity discussed with patient. ? ? ? ?Subjective:  ?No fever, no chest pain, no nausea or vomiting.  Still reporting intermittent left flank mild to moderate tenderness.  Foley catheter has been removed and patient has been able to void. ? ?Physical Exam: ?Vitals:  ? 06/29/21 2117 06/30/21 0540 06/30/21 0828 06/30/21 1323  ?BP: (!) 140/92 128/82  (!) 141/98  ?Pulse: (!) 108 87   83  ?Resp: 17 17  18   ?Temp: (!) 97.5 ?F (36.4 ?C) 97.8 ?F (  36.6 ?C)  97.7 ?F (36.5 ?C)  ?TempSrc:    Oral  ?SpO2: 99% 99% 99% 99%  ?Weight:      ?Height:      ? ?General exam: Alert, awake, oriented x 3; currently afebrile, no chest pain, no nausea, no vomiting.  Still having left flank pain intermittently.  Reports decreased appetite. ?Respiratory system: Clear to auscultation. Respiratory effort normal.  Good saturation on room air. ?Cardiovascular system:RRR. No murmurs, rubs, gallops. ?Gastrointestinal system: Abdomen is obese, nondistended, soft and mild to moderate left flank tenderness on palpation. No organomegaly or masses felt. Normal bowel sounds heard. ?Central nervous system: Alert and oriented. No focal neurological deficits. ?Extremities: No cyanosis or clubbing. ?Skin: No petechiae. ?Psychiatry: Judgement and insight appear normal. Mood & affect appropriate.  ? ?Data Reviewed: ?-WBCs trending down and currently 27.6; normal hemoglobin.  Stable platelet count. ?-Potassium 3.4, sodium 137 and creatinine back to baseline (0.74). ? ?Family Communication: No family at bedside.  Patient's mother has been updated over the phone while examining patient. ? ?Disposition: ?Status is: Inpatient ?Remains inpatient appropriate because: Need for IV antibiotics. ? ? ? Planned Discharge Destination: Home ? ?Author: ?Barton Dubois, MD ?06/30/2021 4:36 PM ? ?For on call review [email or web address].  ? ?

## 2021-06-30 NOTE — Progress Notes (Signed)
1 Day Post-Op Subjective: Patient reports improved left flank pain. Afebrile overnight. Foley removed this morning. Urine culture pending  Objective: Vital signs in last 24 hours: Temp:  [97.5 F (36.4 C)-98.9 F (37.2 C)] 97.8 F (36.6 C) (03/03 0540) Pulse Rate:  [87-108] 87 (03/03 0540) Resp:  [17-20] 17 (03/03 0540) BP: (126-140)/(82-92) 128/82 (03/03 0540) SpO2:  [97 %-99 %] 99 % (03/03 0828)  Intake/Output from previous day: 03/02 0701 - 03/03 0700 In: 2593.6 [P.O.:960; I.V.:1397.5; IV Piggyback:236] Out: 1983 [BJSEG:3151; Blood:10] Intake/Output this shift: No intake/output data recorded.  Physical Exam:  General:alert, cooperative, and appears stated age GI: soft, non tender, normal bowel sounds, no palpable masses, no organomegaly, no inguinal hernia Female genitalia: not done Extremities: extremities normal, atraumatic, no cyanosis or edema  Lab Results: Recent Labs    06/28/21 1603 06/29/21 0429 06/30/21 0459  HGB 12.6 12.3 11.2*  HCT 38.4 39.1 35.3*   BMET Recent Labs    06/29/21 0429 06/30/21 0459  NA 141 137  K 3.5 3.4*  CL 109 108  CO2 22 22  GLUCOSE 86 143*  BUN 9 10  CREATININE 0.81 0.74  CALCIUM 8.2* 8.1*   Recent Labs    06/28/21 1603  INR 1.1   No results for input(s): LABURIN in the last 72 hours. Results for orders placed or performed during the hospital encounter of 06/28/21  Resp Panel by RT-PCR (Flu A&B, Covid) Nasopharyngeal Swab     Status: None   Collection Time: 06/28/21  3:51 PM   Specimen: Nasopharyngeal Swab; Nasopharyngeal(NP) swabs in vial transport medium  Result Value Ref Range Status   SARS Coronavirus 2 by RT PCR NEGATIVE NEGATIVE Final    Comment: (NOTE) SARS-CoV-2 target nucleic acids are NOT DETECTED.  The SARS-CoV-2 RNA is generally detectable in upper respiratory specimens during the acute phase of infection. The lowest concentration of SARS-CoV-2 viral copies this assay can detect is 138 copies/mL. A  negative result does not preclude SARS-Cov-2 infection and should not be used as the sole basis for treatment or other patient management decisions. A negative result may occur with  improper specimen collection/handling, submission of specimen other than nasopharyngeal swab, presence of viral mutation(s) within the areas targeted by this assay, and inadequate number of viral copies(<138 copies/mL). A negative result must be combined with clinical observations, patient history, and epidemiological information. The expected result is Negative.  Fact Sheet for Patients:  EntrepreneurPulse.com.au  Fact Sheet for Healthcare Providers:  IncredibleEmployment.be  This test is no t yet approved or cleared by the [place] FDA and  has been authorized for detection and/or diagnosis of SARS-CoV-2 by FDA under an Emergency Use Authorization (EUA). This EUA will remain  in effect (meaning this test can be used) for the duration of the COVID-19 declaration under Section 564(b)(1) of the Act, 21 U.S.C.section 360bbb-3(b)(1), unless the authorization is terminated  or revoked sooner.       Influenza A by PCR NEGATIVE NEGATIVE Final   Influenza B by PCR NEGATIVE NEGATIVE Final    Comment: (NOTE) The Xpert Xpress SARS-CoV-2/FLU/RSV plus assay is intended as an aid in the diagnosis of influenza from Nasopharyngeal swab specimens and should not be used as a sole basis for treatment. Nasal washings and aspirates are unacceptable for Xpert Xpress SARS-CoV-2/FLU/RSV testing.  Fact Sheet for Patients: EntrepreneurPulse.com.au  Fact Sheet for Healthcare Providers: IncredibleEmployment.be  This test is not yet approved or cleared by the [place] FDA and has been authorized for detection  and/or diagnosis of SARS-CoV-2 by FDA under an Emergency Use Authorization (EUA). This EUA will remain in effect (meaning this test can  be used) for the duration of the COVID-19 declaration under Section 564(b)(1) of the Act, 21 U.S.C. section 360bbb-3(b)(1), unless the authorization is terminated or revoked.  Performed at [facility], [address] [place], [place] [address]   Culture, blood (Routine x 2)     Status: Abnormal (Preliminary result)   Collection Time: 06/28/21  4:03 PM   Specimen: Right Antecubital; Blood  Result Value Ref Range Status   Specimen Description   Final    RIGHT ANTECUBITAL Performed at [facility], [address] [place], [place] [address]    Special Requests   Final    BOTTLES DRAWN AEROBIC AND ANAEROBIC Blood Culture adequate volume Performed at [facility] [place], [place] [address]    Culture  Setup Time   Final    BOTTLES DRAWN AEROBIC AND ANAEROBIC GRAM NEGATIVE RODS Gram Stain Report Called to,Read Back By and Verified With: BROWN, B 3.2.23 BY MATTHEWS, B 0555 CRITICAL RESULT CALLED TO, READ BACK BY AND VERIFIED WITH: E,MADUEME PHARMD @1014  06/29/21 EB    Culture (A)  Final    ESCHERICHIA COLI SUSCEPTIBILITIES TO FOLLOW Performed at [facility], [place] [address], [place], [place] [address]    Report Status PENDING  Incomplete  Blood Culture ID Panel (Reflexed)     Status: Abnormal   Collection Time: 06/28/21  4:03 PM  Result Value Ref Range Status   Enterococcus faecalis NOT DETECTED NOT DETECTED Final   Enterococcus Faecium NOT DETECTED NOT DETECTED Final   Listeria monocytogenes NOT DETECTED NOT DETECTED Final   Staphylococcus species NOT DETECTED NOT DETECTED Final   Staphylococcus aureus (BCID) NOT DETECTED NOT DETECTED Final   Staphylococcus epidermidis NOT DETECTED NOT DETECTED Final   Staphylococcus lugdunensis NOT DETECTED NOT DETECTED Final   Streptococcus species NOT DETECTED NOT DETECTED Final   Streptococcus agalactiae NOT DETECTED NOT DETECTED Final   Streptococcus pneumoniae NOT DETECTED NOT DETECTED Final   Streptococcus pyogenes NOT  DETECTED NOT DETECTED Final   A.calcoaceticus-baumannii NOT DETECTED NOT DETECTED Final   Bacteroides fragilis NOT DETECTED NOT DETECTED Final   Enterobacterales DETECTED (A) NOT DETECTED Final    Comment: Enterobacterales represent a large order of gram negative bacteria, not a single organism. CRITICAL RESULT CALLED TO, READ BACK BY AND VERIFIED WITH: E,MADUEME PHARMD @1014  06/29/21 EB    Enterobacter cloacae complex NOT DETECTED NOT DETECTED Final   Escherichia coli DETECTED (A) NOT DETECTED Final    Comment: CRITICAL RESULT CALLED TO, READ BACK BY AND VERIFIED WITH: E,MADUEME PHARMD @1014  06/29/21 EB    Klebsiella aerogenes NOT DETECTED NOT DETECTED Final   Klebsiella oxytoca NOT DETECTED NOT DETECTED Final   Klebsiella pneumoniae NOT DETECTED NOT DETECTED Final   Proteus species NOT DETECTED NOT DETECTED Final   Salmonella species NOT DETECTED NOT DETECTED Final   Serratia marcescens NOT DETECTED NOT DETECTED Final   Haemophilus influenzae NOT DETECTED NOT DETECTED Final   Neisseria meningitidis NOT DETECTED NOT DETECTED Final   Pseudomonas aeruginosa NOT DETECTED NOT DETECTED Final   Stenotrophomonas maltophilia NOT DETECTED NOT DETECTED Final   Candida albicans NOT DETECTED NOT DETECTED Final   Candida auris NOT DETECTED NOT DETECTED Final   Candida glabrata NOT DETECTED NOT DETECTED Final   Candida krusei NOT DETECTED NOT DETECTED Final   Candida parapsilosis NOT DETECTED NOT DETECTED Final  Candida tropicalis NOT DETECTED NOT DETECTED Final   Cryptococcus neoformans/gattii NOT DETECTED NOT DETECTED Final   CTX-M ESBL NOT DETECTED NOT DETECTED Final   Carbapenem resistance IMP NOT DETECTED NOT DETECTED Final   Carbapenem resistance KPC NOT DETECTED NOT DETECTED Final   Carbapenem resistance NDM NOT DETECTED NOT DETECTED Final   Carbapenem resist OXA 48 LIKE NOT DETECTED NOT DETECTED Final   Carbapenem resistance VIM NOT DETECTED NOT DETECTED Final    Comment: Performed at  [facility], [place] [address], [place], [place] [address]  Culture, blood (Routine x 2)     Status: Abnormal (Preliminary result)   Collection Time: 06/28/21  4:04 PM   Specimen: BLOOD RIGHT HAND  Result Value Ref Range Status   Specimen Description   Final    BLOOD RIGHT HAND Performed at [facility], [address], [place], [place] [address]    Special Requests   Final    BOTTLES DRAWN AEROBIC AND ANAEROBIC Blood Culture adequate volume Performed at [facility], [address], [place], [place] [address]    Culture  Setup Time   Final    BOTTLES DRAWN AEROBIC AND ANAEROBIC GRAM NEGATIVE RODS Gram Stain Report Called to,Read Back By and Verified With: BROWN,B@0555  BY MATTHEWS, B3.2.23 CRITICAL VALUE NOTED.  VALUE IS CONSISTENT WITH PREVIOUSLY REPORTED AND CALLED VALUE. Performed at [facility], [place] [address] [place], [place] [address]    Culture ESCHERICHIA COLI (A)  Final   Report Status PENDING  Incomplete  Urine Culture     Status: Abnormal (Preliminary result)   Collection Time: 06/28/21  5:03 PM   Specimen: Urine, Clean Catch  Result Value Ref Range Status   Specimen Description   Final    URINE, CLEAN CATCH Performed at [facility] [address] [place], [place] [address]    Special Requests   Final    NONE Performed at [facility], [address], [place], [place] [address]    Culture (A)  Final    50,000 COLONIES/mL ESCHERICHIA COLI SUSCEPTIBILITIES TO FOLLOW Performed at [facility], [place] [address] [place],  [address]    Report Status PENDING  Incomplete    Studies/Results: DG Chest Port 1 View  Result Date: 06/28/2021 CLINICAL DATA:  Fever EXAM: PORTABLE CHEST 1 VIEW COMPARISON:  01/11/2020 FINDINGS: The heart size and mediastinal contours are within normal limits. Both lungs are clear. The visualized skeletal structures are unremarkable. IMPRESSION: No active disease. Electronically Signed   By: Elmer Picker M.D.   On:  06/28/2021 16:34   DG C-Arm 1-60 Min-No Report  Result Date: 06/29/2021 Fluoroscopy was utilized by the requesting physician.  No radiographic interpretation.   CT RENAL STONE STUDY  Result Date: 06/28/2021 CLINICAL DATA:  Left-sided flank pain, fever for 3 days EXAM: CT ABDOMEN AND PELVIS WITHOUT CONTRAST TECHNIQUE: Multidetector CT imaging of the abdomen and pelvis was performed following the standard protocol without IV contrast. RADIATION DOSE REDUCTION: This exam was performed according to the departmental dose-optimization program which includes automated exposure control, adjustment of the mA and/or kV according to patient size and/or use of iterative reconstruction technique. COMPARISON:  02/03/2017 FINDINGS: Lower chest: No acute pleural or parenchymal lung disease. Hepatobiliary: Unremarkable unenhanced appearance of the liver and gallbladder. Pancreas: Unremarkable unenhanced appearance. Spleen: Unremarkable unenhanced appearance. Adrenals/Urinary Tract: There is an obstructing 6 mm calculus within the distal left ureter reference image 74/2. There is moderate left-sided hydronephrosis and hydroureter, with left perinephric  fat stranding and left renal edema. The right kidney is unremarkable. The adrenals and bladder are unremarkable. Stomach/Bowel: No bowel obstruction or ileus. Scattered sigmoid diverticulosis without diverticulitis. Normal appendix right lower quadrant. No bowel wall thickening or inflammatory change. Vascular/Lymphatic: No significant vascular findings are present. No enlarged abdominal or pelvic lymph nodes. Reproductive: Uterus and bilateral adnexa are unremarkable. Other: Trace pelvic free fluid likely physiologic. No free intraperitoneal gas. No abdominal wall hernia. Musculoskeletal: No acute or destructive bony lesions. Reconstructed images demonstrate no additional findings. IMPRESSION: 1. Obstructing 6 mm distal left ureteral calculus, with moderate left-sided  obstructive uropathy. 2. Trace pelvic free fluid, likely physiologic. Electronically Signed   By: Randa Ngo M.D.   On: 06/28/2021 19:21    Assessment/Plan: POD#1 left ureteral stent placement Please continue broad spectrum antibiotics pending urine culture. She will be scheduled for left ureteroscopy and stone extraction in 2 weeks   LOS: 2 days   Nicolette Bang 06/30/2021, 12:35 PM

## 2021-06-30 NOTE — H&P (View-Only) (Signed)
1 Day Post-Op ?Subjective: ?Patient reports improved left flank pain. Afebrile overnight. Foley removed this morning. Urine culture pending ? ?Objective: ?Vital signs in last 24 hours: ?Temp:  [97.5 ?F (36.4 ?C)-98.9 ?F (37.2 ?C)] 97.8 ?F (36.6 ?C) (03/03 0540) ?Pulse Rate:  [87-108] 87 (03/03 0540) ?Resp:  [17-20] 17 (03/03 0540) ?BP: (126-140)/(82-92) 128/82 (03/03 0540) ?SpO2:  [97 %-99 %] 99 % (03/03 0828) ? ?Intake/Output from previous day: ?03/02 0701 - 03/03 0700 ?In: 2593.6 [P.O.:960; I.V.:1397.5; IV Piggyback:236] ?Out: 1983 [Urine:1975; Blood:10] ?Intake/Output this shift: ?No intake/output data recorded. ? ?Physical Exam:  ?General:alert, cooperative, and appears stated age ?GI: soft, non tender, normal bowel sounds, no palpable masses, no organomegaly, no inguinal hernia ?Female genitalia: not done ?Extremities: extremities normal, atraumatic, no cyanosis or edema ? ?Lab Results: ?Recent Labs  ?  06/28/21 ?1603 06/29/21 ?0429 06/30/21 ?0459  ?HGB 12.6 12.3 11.2*  ?HCT 38.4 39.1 35.3*  ? ?BMET ?Recent Labs  ?  06/29/21 ?0429 06/30/21 ?0459  ?NA 141 137  ?K 3.5 3.4*  ?CL 109 108  ?CO2 22 22  ?GLUCOSE 86 143*  ?BUN 9 10  ?CREATININE 0.81 0.74  ?CALCIUM 8.2* 8.1*  ? ?Recent Labs  ?  06/28/21 ?1603  ?INR 1.1  ? ?No results for input(s): LABURIN in the last 72 hours. ?Results for orders placed or performed during the hospital encounter of 06/28/21  ?Resp Panel by RT-PCR (Flu A&B, Covid) Nasopharyngeal Swab     Status: None  ? Collection Time: 06/28/21  3:51 PM  ? Specimen: Nasopharyngeal Swab; Nasopharyngeal(NP) swabs in vial transport medium  ?Result Value Ref Range Status  ? SARS Coronavirus 2 by RT PCR NEGATIVE NEGATIVE Final  ?  Comment: (NOTE) ?SARS-CoV-2 target nucleic acids are NOT DETECTED. ? ?The SARS-CoV-2 RNA is generally detectable in upper respiratory ?specimens during the acute phase of infection. The lowest ?concentration of SARS-CoV-2 viral copies this assay can detect is ?138 copies/mL. A  negative result does not preclude SARS-Cov-2 ?infection and should not be used as the sole basis for treatment or ?other patient management decisions. A negative result may occur with  ?improper specimen collection/handling, submission of specimen other ?than nasopharyngeal swab, presence of viral mutation(s) within the ?areas targeted by this assay, and inadequate number of viral ?copies(<138 copies/mL). A negative result must be combined with ?clinical observations, patient history, and epidemiological ?information. The expected result is Negative. ? ?Fact Sheet for Patients:  ?EntrepreneurPulse.com.au ? ?Fact Sheet for Healthcare Providers:  ?IncredibleEmployment.be ? ?This test is no t yet approved or cleared by the [place] FDA and  ?has been authorized for detection and/or diagnosis of SARS-CoV-2 by ?FDA under an Emergency Use Authorization (EUA). This EUA will remain  ?in effect (meaning this test can be used) for the duration of the ?COVID-19 declaration under Section 564(b)(1) of the Act, 21 ?U.S.C.section 360bbb-3(b)(1), unless the authorization is terminated  ?or revoked sooner.  ? ? ?  ? Influenza A by PCR NEGATIVE NEGATIVE Final  ? Influenza B by PCR NEGATIVE NEGATIVE Final  ?  Comment: (NOTE) ?The Xpert Xpress SARS-CoV-2/FLU/RSV plus assay is intended as an aid ?in the diagnosis of influenza from Nasopharyngeal swab specimens and ?should not be used as a sole basis for treatment. Nasal washings and ?aspirates are unacceptable for Xpert Xpress SARS-CoV-2/FLU/RSV ?testing. ? ?Fact Sheet for Patients: ?EntrepreneurPulse.com.au ? ?Fact Sheet for Healthcare Providers: ?IncredibleEmployment.be ? ?This test is not yet approved or cleared by the [place] and ?has been authorized for detection  and/or diagnosis of SARS-CoV-2 by ?FDA under an Emergency Use Authorization (EUA). This EUA will remain ?in effect (meaning this test can  be used) for the duration of the ?COVID-19 declaration under Section 564(b)(1) of the Act, 21 U.S.C. ?section 360bbb-3(b)(1), unless the authorization is terminated or ?revoked. ? ?Performed at [facility], [address], [place], [place] [address] ?  ?Culture, blood (Routine x 2)     Status: Abnormal (Preliminary result)  ? Collection Time: 06/28/21  4:03 PM  ? Specimen: Right Antecubital; Blood  ?Result Value Ref Range Status  ? Specimen Description   Final  ?  RIGHT ANTECUBITAL ?Performed at [facility], [address] [place], [place] [address] ?  ? Special Requests   Final  ?  BOTTLES DRAWN AEROBIC AND ANAEROBIC Blood Culture adequate volume ?Performed at [facility], [address] [place], [place] [address] ?  ? Culture  Setup Time   Final  ?  BOTTLES DRAWN AEROBIC AND ANAEROBIC GRAM NEGATIVE RODS Gram Stain Report Called to,Read Back By and Verified With: BROWN, B 3.2.23 [place], B 0555 ?CRITICAL RESULT CALLED TO, READ BACK BY AND VERIFIED WITH: ?E,MADUEME PHARMD @1014  06/29/21 EB ?  ? Culture (A)  Final  ?  ESCHERICHIA COLI ?SUSCEPTIBILITIES TO FOLLOW ?Performed at [place] [facility], [place] [address] [place], [place] [address] ?  ? Report Status PENDING  Incomplete  ?Blood Culture ID Panel (Reflexed)     Status: Abnormal  ? Collection Time: 06/28/21  4:03 PM  ?Result Value Ref Range Status  ? Enterococcus faecalis NOT DETECTED NOT DETECTED Final  ? Enterococcus Faecium NOT DETECTED NOT DETECTED Final  ? Listeria monocytogenes NOT DETECTED NOT DETECTED Final  ? Staphylococcus species NOT DETECTED NOT DETECTED Final  ? Staphylococcus aureus (BCID) NOT DETECTED NOT DETECTED Final  ? Staphylococcus epidermidis NOT DETECTED NOT DETECTED Final  ? Staphylococcus lugdunensis NOT DETECTED NOT DETECTED Final  ? Streptococcus species NOT DETECTED NOT DETECTED Final  ? Streptococcus agalactiae NOT DETECTED NOT DETECTED Final  ? Streptococcus pneumoniae NOT DETECTED NOT DETECTED Final  ? Streptococcus pyogenes NOT  DETECTED NOT DETECTED Final  ? A.calcoaceticus-baumannii NOT DETECTED NOT DETECTED Final  ? Bacteroides fragilis NOT DETECTED NOT DETECTED Final  ? Enterobacterales DETECTED (A) NOT DETECTED Final  ?  Comment: Enterobacterales represent a large order of gram negative bacteria, not a single organism. ?CRITICAL RESULT CALLED TO, READ BACK BY AND VERIFIED WITH: ?E,MADUEME PHARMD @1014  06/29/21 EB ?  ? Enterobacter cloacae complex NOT DETECTED NOT DETECTED Final  ? Escherichia coli DETECTED (A) NOT DETECTED Final  ?  Comment: CRITICAL RESULT CALLED TO, READ BACK BY AND VERIFIED WITH: ?E,MADUEME PHARMD @1014  06/29/21 EB ?  ? Klebsiella aerogenes NOT DETECTED NOT DETECTED Final  ? Klebsiella oxytoca NOT DETECTED NOT DETECTED Final  ? Klebsiella pneumoniae NOT DETECTED NOT DETECTED Final  ? Proteus species NOT DETECTED NOT DETECTED Final  ? Salmonella species NOT DETECTED NOT DETECTED Final  ? Serratia marcescens NOT DETECTED NOT DETECTED Final  ? Haemophilus influenzae NOT DETECTED NOT DETECTED Final  ? Neisseria meningitidis NOT DETECTED NOT DETECTED Final  ? Pseudomonas aeruginosa NOT DETECTED NOT DETECTED Final  ? Stenotrophomonas maltophilia NOT DETECTED NOT DETECTED Final  ? Candida albicans NOT DETECTED NOT DETECTED Final  ? Candida auris NOT DETECTED NOT DETECTED Final  ? Candida glabrata NOT DETECTED NOT DETECTED Final  ? Candida krusei NOT DETECTED NOT DETECTED Final  ? Candida parapsilosis NOT DETECTED NOT DETECTED Final  ?  Candida tropicalis NOT DETECTED NOT DETECTED Final  ? Cryptococcus neoformans/gattii NOT DETECTED NOT DETECTED Final  ? CTX-M ESBL NOT DETECTED NOT DETECTED Final  ? Carbapenem resistance IMP NOT DETECTED NOT DETECTED Final  ? Carbapenem resistance KPC NOT DETECTED NOT DETECTED Final  ? Carbapenem resistance NDM NOT DETECTED NOT DETECTED Final  ? Carbapenem resist OXA 48 LIKE NOT DETECTED NOT DETECTED Final  ? Carbapenem resistance VIM NOT DETECTED NOT DETECTED Final  ?  Comment: Performed at  [facility], [place] [address], [place], [place] [address]  ?Culture, blood (Routine x 2)     Status: Abnormal (Preliminary result)  ? Collection Time: 06/28/21  4:04 PM  ? Specimen: BLOOD RIGHT HAND  ?

## 2021-07-01 DIAGNOSIS — R6520 Severe sepsis without septic shock: Secondary | ICD-10-CM | POA: Diagnosis not present

## 2021-07-01 DIAGNOSIS — I10 Essential (primary) hypertension: Secondary | ICD-10-CM | POA: Diagnosis not present

## 2021-07-01 DIAGNOSIS — E876 Hypokalemia: Secondary | ICD-10-CM | POA: Diagnosis not present

## 2021-07-01 DIAGNOSIS — E559 Vitamin D deficiency, unspecified: Secondary | ICD-10-CM | POA: Diagnosis not present

## 2021-07-01 DIAGNOSIS — N132 Hydronephrosis with renal and ureteral calculous obstruction: Secondary | ICD-10-CM | POA: Diagnosis not present

## 2021-07-01 DIAGNOSIS — A419 Sepsis, unspecified organism: Secondary | ICD-10-CM | POA: Diagnosis not present

## 2021-07-01 DIAGNOSIS — N12 Tubulo-interstitial nephritis, not specified as acute or chronic: Secondary | ICD-10-CM | POA: Diagnosis not present

## 2021-07-01 DIAGNOSIS — N179 Acute kidney failure, unspecified: Secondary | ICD-10-CM | POA: Diagnosis not present

## 2021-07-01 LAB — CULTURE, BLOOD (ROUTINE X 2)
Special Requests: ADEQUATE
Special Requests: ADEQUATE

## 2021-07-01 LAB — URINE CULTURE: Culture: 50000 — AB

## 2021-07-01 MED ORDER — FLEET ENEMA 7-19 GM/118ML RE ENEM
1.0000 | ENEMA | Freq: Once | RECTAL | Status: AC
  Administered 2021-07-01: 1 via RECTAL

## 2021-07-01 MED ORDER — CEFDINIR 300 MG PO CAPS
300.0000 mg | ORAL_CAPSULE | Freq: Two times a day (BID) | ORAL | Status: DC
Start: 2021-07-01 — End: 2021-07-02
  Administered 2021-07-01 – 2021-07-02 (×3): 300 mg via ORAL
  Filled 2021-07-01 (×4): qty 1

## 2021-07-01 NOTE — Progress Notes (Signed)
?Progress Note ? ? ?Patient: [name] XVQ:[id number] DOB: 26-Jan-1981 DOA: 06/28/2021     3 ?DOS: the patient was seen and examined on 07/01/2021 ?  ?Brief hospital admission narrative ?As per H&P written by Dr. Waldron Labs on 06/28/2021 ?[name]  is a 41 y.o. female, past medical history of hypertension, anemia, asthma, GERD, class III obesity, recurrent UTIs (with ESBL infection on December of last year); who presented to the ED secondary to fever of 104, dysuria and left flank pain, radiating into her left lateral abdomen, she does report nausea, but no vomiting, reports symptoms resembles previous UTIs.  Patient reports history of kidney stones in the past which did not require any intervention, she denies any hemoptysis, dyspnea, chest pain, hematuria, focal weakness, or any other complaints.. ?-In ED patient was noted to be hypotensive, tachycardic despite IV fluid resuscitation, lactic acid elevated at 2.6, potassium low at 2.9, creatinine at 1.19, leukocytosis at 15 K, she had positive UA and CT renal stone protocol with significant Obstructing 6 mm distal left ureteral calculus, with moderate left-sided obstructive uropathy and hydronephrosis. Blood cultures were sent, urine cultures were sent, she was started on meropenem due to history of ESBL UTI in the past. ? ? ?Assessment and Plan: ?* Pyelonephritis ?-With prior history of ESBL and images demonstrating left sided obstructive uropathy and hydronephrosis. ?-Urology service has been consulted and patient is status post cystoscopy with stent placement. ?-Continue supportive care. ?-Cultures results demonstrating E. coli only resistant to ampicillin. ?-Will continue as needed analgesics, antiemetics and will give a trial to tolerate oral antibiotics prior to discharge. ?-Meropenem and will be discontinue and the patient started on cefdinir.  Plan is to cover for a total of 10 days.     ?-Continue to maintain adequate  hydration. ? ?Hydronephrosis ?-Status post left ureteral stent placement ?-Urology service on board; will follow recommendation. ?-Per plan she will be scheduled for ureteroscopy and a stone extraction in 2 weeks. ? ?Sepsis ([place]) ?-Patient met sepsis criteria at time of admission secondary to UTI/pyelonephritis and E. coli bacteremia; patient with presence of hypotension, tachypnea, tachycardia, elevated WBCs and elevated lactic acid. ?-Will continue to maintain adequate hydration's and provide supportive care. ?-Continue as needed analgesics and following culture results will transition antibiotics to cefdinir.  Assessing patient tolerance and response prior to discharge. ? ?Vitamin D deficiency ?-Continue vitamin D supplementation ? ?Hypokalemia ?-Continue to follow electrolytes and further replete as needed. ?-Repeat basic metabolic panel in AM. ? ?AKI (acute kidney injury) ([place]) ?-In the setting of urosepsis and postrenal process with obstructive uropathy/hydronephrosis. ?-Status post stenting placement by urology service ?-Continue treatment with antibiotics and maintain adequate hydration as mentioned above. ?-Will continue to follow renal function trend and avoid nephrotoxic agents. ? ? ? ? ?Hypertension ?-Overall stable and well-controlled. ?-Continue holding antihypertensive agents while actively providing fluid resuscitation and treating acute sepsis in the setting of UTI. ?-Continue to follow vital signs. ? ?Morbid obesity ([place]) ?-Body mass index is 44.93 kg/m?. ?-Low calorie diet, portion control and increase physical activity discussed with patient. ? ? ? ?Subjective:  ?No fever, no chest pain, no vomiting.  Reporting ongoing nausea and difficulty eating.  Patient is afraid that she will end up throwing up.  Continues to have left flank discomfort. ? ?Physical Exam: ?Vitals:  ? 06/30/21 1929 07/01/21 0519 07/01/21 0737 07/01/21 1337  ?BP: (!) 146/61 (!) 153/89  (!) 149/97  ?Pulse: 73 70  71  ?Resp: _0 ?Temp:  98.4 ?F (36.9 ?C) 98.3 ?F (36.8 ?C)  98.2 ?F (36.8 ?C)  ?TempSrc: Oral   Oral  ?SpO2: 99% 99% 91% 97%  ?Weight:      ?Height:      ? ?General exam: Alert, awake, oriented x 3; reporting intermittent nausea and difficulty keeping things down orally.  She is afebrile, still having left flank discomfort. ?Respiratory system: Clear to auscultation. Respiratory effort normal.  No using accessory muscle.  Good saturation on room air. ?Cardiovascular system:RRR. No murmurs, rubs, gallops.  No JVD ?Gastrointestinal system: Abdomen is obese, nondistended, soft and with left flank tenderness. No organomegaly or masses felt. Normal bowel sounds heard. ?Central nervous system: Alert and oriented. No focal neurological deficits. ?Extremities: No cyanosis or clubbing. ?Skin: No petechiae. ?Psychiatry: Judgement and insight appear normal. Mood & affect appropriate.  ? ?Data Reviewed: ?Patient has remained afebrile; mild intermittent episodes of nonsustained tachycardia appreciated on telemetry. ?WBCs continue to trend down. ?Renal function back to normal range. ?-Will repeat blood work in AM. ? ?Family Communication: No family at bedside.  Patient's mother has been updated over the phone while examining patient. ? ?Disposition: ?Status is: Inpatient ?Remains inpatient appropriate because: Need for IV antibiotics. ? ? ? Planned Discharge Destination: Home ? ?Author: ?Barton Dubois, MD ?07/01/2021 5:26 PM ? ?For on call review [email or web address].  ? ?

## 2021-07-01 NOTE — Progress Notes (Signed)
Pt states was able to eat 75% of her supper and keep it down. Pt states that she does not want to go home tonight, will wait till tomorrow morning due to time and some increased pain in her left flank area. Pt requested pain med, admin'd per order. ?

## 2021-07-02 DIAGNOSIS — N12 Tubulo-interstitial nephritis, not specified as acute or chronic: Secondary | ICD-10-CM | POA: Diagnosis not present

## 2021-07-02 DIAGNOSIS — N179 Acute kidney failure, unspecified: Secondary | ICD-10-CM | POA: Diagnosis not present

## 2021-07-02 DIAGNOSIS — A419 Sepsis, unspecified organism: Secondary | ICD-10-CM | POA: Diagnosis not present

## 2021-07-02 DIAGNOSIS — R6520 Severe sepsis without septic shock: Secondary | ICD-10-CM | POA: Diagnosis not present

## 2021-07-02 DIAGNOSIS — N132 Hydronephrosis with renal and ureteral calculous obstruction: Secondary | ICD-10-CM | POA: Diagnosis not present

## 2021-07-02 DIAGNOSIS — E876 Hypokalemia: Secondary | ICD-10-CM | POA: Diagnosis not present

## 2021-07-02 DIAGNOSIS — E559 Vitamin D deficiency, unspecified: Secondary | ICD-10-CM | POA: Diagnosis not present

## 2021-07-02 DIAGNOSIS — I10 Essential (primary) hypertension: Secondary | ICD-10-CM | POA: Diagnosis not present

## 2021-07-02 LAB — CBC
HCT: 33.9 % — ABNORMAL LOW (ref 36.0–46.0)
Hemoglobin: 11.2 g/dL — ABNORMAL LOW (ref 12.0–15.0)
MCH: 29.6 pg (ref 26.0–34.0)
MCHC: 33 g/dL (ref 30.0–36.0)
MCV: 89.7 fL (ref 80.0–100.0)
Platelets: 287 10*3/uL (ref 150–400)
RBC: 3.78 MIL/uL — ABNORMAL LOW (ref 3.87–5.11)
RDW: 13.8 % (ref 11.5–15.5)
WBC: 14.5 10*3/uL — ABNORMAL HIGH (ref 4.0–10.5)
nRBC: 0 % (ref 0.0–0.2)

## 2021-07-02 LAB — BASIC METABOLIC PANEL
Anion gap: 7 (ref 5–15)
BUN: 5 mg/dL — ABNORMAL LOW (ref 6–20)
CO2: 26 mmol/L (ref 22–32)
Calcium: 8.2 mg/dL — ABNORMAL LOW (ref 8.9–10.3)
Chloride: 104 mmol/L (ref 98–111)
Creatinine, Ser: 0.64 mg/dL (ref 0.44–1.00)
GFR, Estimated: >60 mL/min (ref >60)
Glucose, Bld: 101 mg/dL — ABNORMAL HIGH (ref 70–99)
Potassium: 3.4 mmol/L — ABNORMAL LOW (ref 3.5–5.1)
Sodium: 137 mmol/L (ref 135–145)

## 2021-07-02 LAB — MAGNESIUM: Magnesium: 1.6 mg/dL — ABNORMAL LOW (ref 1.7–2.4)

## 2021-07-02 MED ORDER — ONDANSETRON 8 MG PO TBDP
8.0000 mg | ORAL_TABLET | Freq: Three times a day (TID) | ORAL | 0 refills | Status: DC | PRN
Start: 2021-07-02 — End: 2021-07-27

## 2021-07-02 MED ORDER — ROPINIROLE HCL 1 MG PO TABS: 1.0000 mg | ORAL_TABLET | Freq: Every day | ORAL | 1 refills | Status: DC

## 2021-07-02 MED ORDER — CEFDINIR 300 MG PO CAPS: 300.0000 mg | ORAL_CAPSULE | Freq: Two times a day (BID) | ORAL | 0 refills | Status: AC

## 2021-07-02 MED ORDER — MAGNESIUM SULFATE 2 GM/50ML IV SOLN: 2.0000 g | Freq: Once | INTRAVENOUS | Status: DC

## 2021-07-02 MED ORDER — POTASSIUM CHLORIDE CRYS ER 20 MEQ PO TBCR
40.0000 meq | EXTENDED_RELEASE_TABLET | Freq: Once | ORAL | Status: AC
  Administered 2021-07-02: 40 meq via ORAL
  Filled 2021-07-02: qty 2

## 2021-07-02 MED ORDER — POTASSIUM CHLORIDE CRYS ER 20 MEQ PO TBCR: 20.0000 meq | EXTENDED_RELEASE_TABLET | Freq: Every day | ORAL | 0 refills | Status: DC

## 2021-07-02 MED ORDER — OXYCODONE HCL 5 MG PO TABS: 5.0000 mg | ORAL_TABLET | Freq: Four times a day (QID) | ORAL | 0 refills | Status: DC | PRN

## 2021-07-02 NOTE — Discharge Summary (Signed)
Physician Discharge Summary   Patient: [name] MRN: [id number] DOB: Apr 11, 1981  Admit date:     06/28/2021  Discharge date: 07/02/21  Discharge Physician: Barton Dubois   PCP: [name], MD   Recommendations at discharge:  Repeat basic metabolic panel to follow ultralights renal function Reassess blood pressure and further adjust antihypertensive treatment as needed Make sure patient has follow-up with urology service as instructed for definitive treatment of nephrolithiasis and recent stent placement. Continue assisting patient with weight loss management.  Discharge Diagnoses: Principal Problem:   Pyelonephritis Active Problems:   Morbid obesity ([place])   Hypertension   AKI (acute kidney injury) ([place])   Hypokalemia   Vitamin D deficiency   Sepsis ([place])   Hydronephrosis  Brief hospital admission narrative As per H&P written by Dr. Waldron Labs on 06/28/2021 [name]  is a 41 y.o. female, past medical history of hypertension, anemia, asthma, GERD, class III obesity, recurrent UTIs (with ESBL infection on December of last year); who presented to the ED secondary to fever of 104, dysuria and left flank pain, radiating into her left lateral abdomen, she does report nausea, but no vomiting, reports symptoms resembles previous UTIs.  Patient reports history of kidney stones in the past which did not require any intervention, she denies any hemoptysis, dyspnea, chest pain, hematuria, focal weakness, or any other complaints.. -In ED patient was noted to be hypotensive, tachycardic despite IV fluid resuscitation, lactic acid elevated at 2.6, potassium low at 2.9, creatinine at 1.19, leukocytosis at 15 K, she had positive UA and CT renal stone protocol with significant Obstructing 6 mm distal left ureteral calculus, with moderate left-sided obstructive uropathy and hydronephrosis. Blood cultures were sent, urine cultures were sent, she was started on meropenem due to  history of ESBL UTI in the past.  Assessment and Plan: * Pyelonephritis -With prior history of ESBL and images demonstrating left sided obstructive uropathy and hydronephrosis. -Urology service has been consulted and patient is status post cystoscopy with stent placement. -Continue supportive care. -Cultures results demonstrating E. coli only resistant to ampicillin this time. -Will continue as needed analgesics, antiemetics and antibiotic has been successfully transitioned to oral cefdinir with intention to treat for 11 more days at discharge. -Definitive treatment for her nephrolithiasis and recent stent placement to happen 2 weeks after discharge with a schedule ureteroscopy and stone extraction.  Hydronephrosis -Status post left ureteral stent placement -Appreciate urology service recommendations and assistance. -Per plan she will be scheduled for ureteroscopy and a stone extraction in 2 weeks.  Sepsis ([place]) -Patient met sepsis criteria at time of admission secondary to UTI/pyelonephritis and E. coli bacteremia; patient with presence of hypotension, tachypnea, tachycardia, elevated WBCs and elevated lactic acid. -Continue to maintain adequate hydration -Continue treatment with antibiotics using cefdinir for 11 more days. -Patient will follow-up with urology service as an outpatient.  Vitamin D deficiency -Continue vitamin D supplementation  Hypokalemia -Electrolytes has been repleted has resume home maintenance supplementation. -Repeat basic metabolic panel to follow ultralights trend at follow-up visit.  AKI (acute kidney injury) ([place]) -In the setting of urosepsis and postrenal process with obstructive uropathy/hydronephrosis. -Status post stent placement by urology service -Continue treatment with antibiotics for UTI and maintain adequate hydration. -Repeat basic metabolic panel follow-up visit to assess renal function and stability. -Creatinine back to normal at  discharge.  Hypertension -Overall stable and well-controlled. -Advised to follow heart healthy diet -Resume home antihypertensive agents.  Morbid obesity ([place]) -Body mass index is 44.93 kg/m. -  Low calorie diet, portion control and increase physical activity discussed with patient.   Consultants: Urology service Procedures performed: See below for x-ray report; left ureteral stent placement and cystoscopy Disposition: Discharged home with instruction to follow-up with PCP and urology service. Diet recommendation: Heart healthy and low calorie diet.  DISCHARGE MEDICATION: Allergies as of 07/02/2021       Reactions   Banana Rash   Pineapple Rash        Medication List     STOP taking these medications    cephALEXin 500 MG capsule Commonly known as: KEFLEX   ketorolac 10 MG tablet Commonly known as: TORADOL       TAKE these medications    acetaminophen 500 MG tablet Commonly known as: TYLENOL Take 1,000 mg by mouth every 6 (six) hours as needed for mild pain.   albuterol 108 (90 Base) MCG/ACT inhaler Commonly known as: Proventil HFA Inhale 2 puffs into the lungs every 6 (six) hours as needed for wheezing (cough).   amLODipine 5 MG tablet Commonly known as: NORVASC Take 1 tablet (5 mg total) by mouth daily.   cefdinir 300 MG capsule Commonly known as: OMNICEF Take 1 capsule (300 mg total) by mouth every 12 (twelve) hours for 11 days.   EPINEPHrine 0.3 mg/0.3 mL Soaj injection Commonly known as: EPI-PEN Inject 0.3 mg into the muscle as needed for anaphylaxis.   fluticasone 50 MCG/ACT nasal spray Commonly known as: FLONASE Place 2 sprays into both nostrils daily as needed for allergies or rhinitis.   fluticasone-salmeterol 115-21 MCG/ACT inhaler Commonly known as: ADVAIR HFA Inhale 2 puffs into the lungs 2 (two) times daily. What changed: when to take this   hydrochlorothiazide 12.5 MG capsule Commonly known as: MICROZIDE Take 1 capsule (12.5 mg  total) by mouth daily. TAKE (1) CAPSULE BY MOUTH ONCE EVERY DAY. Strength: 12.5 mg   Olopatadine HCl 0.2 % Soln Use 1 drop in each eye once a day as needed for itchy watery eyes What changed:  how much to take how to take this when to take this reasons to take this additional instructions   ondansetron 8 MG disintegrating tablet Commonly known as: ZOFRAN-ODT Take 1 tablet (8 mg total) by mouth every 8 (eight) hours as needed for nausea or vomiting.   oxyCODONE 5 MG immediate release tablet Commonly known as: Oxy IR/ROXICODONE Take 1 tablet (5 mg total) by mouth every 6 (six) hours as needed for moderate pain.   potassium chloride SA 20 MEQ tablet Commonly known as: KLOR-CON M Take 1 tablet (20 mEq total) by mouth daily.   rOPINIRole 1 MG tablet Commonly known as: REQUIP Take 1 tablet (1 mg total) by mouth at bedtime.        Follow-up Information     [name], MD. Schedule an appointment as soon as possible for a visit in 2 week(s).   Specialty: Internal Medicine Contact information: [address] [place] [address] [phone]         Cleon Gustin, MD. Schedule an appointment as soon as possible for a visit in 10 day(s).   Specialty: Urology Contact information: [address]  [place] [address] [phone]                 Discharge Exam: Danley Danker Weights   06/28/21 1530 06/29/21 0817 06/29/21 0821  Weight: 122.5 kg 122.5 kg 122.5 kg   General exam: Alert, awake, oriented x 3; no chest pain, currently no  nausea, no vomiting, no shortness of breath and no fever.  Patient feeling ready to go home and has tolerated oral antibiotics. Respiratory system: Clear to auscultation. Respiratory effort normal.  No using accessory muscle.  Good saturation on room air. Cardiovascular system:RRR. No murmurs, rubs, gallops.  No JVD. Gastrointestinal system: Abdomen is obese, nondistended, soft and mild tenderness to deep  palpation in her left flank.   No organomegaly or masses felt. Normal bowel sounds heard. Central nervous system: Alert and oriented. No focal neurological deficits. Extremities: No cyanosis or clubbing. Skin: No petechiae. Psychiatry: Judgement and insight appear normal. Mood & affect appropriate.    Condition at discharge: Stable and improved.  Discharged home with instruction to follow-up with PCP and neurology service.  The results of significant diagnostics from this hospitalization (including imaging, microbiology, ancillary and laboratory) are listed below for reference.   Imaging Studies: DG Chest Port 1 View  Result Date: 06/28/2021 CLINICAL DATA:  Fever EXAM: PORTABLE CHEST 1 VIEW COMPARISON:  01/11/2020 FINDINGS: The heart size and mediastinal contours are within normal limits. Both lungs are clear. The visualized skeletal structures are unremarkable. IMPRESSION: No active disease. Electronically Signed   By: Elmer Picker M.D.   On: 06/28/2021 16:34   DG C-Arm 1-60 Min-No Report  Result Date: 06/29/2021 Fluoroscopy was utilized by the requesting physician.  No radiographic interpretation.   CT RENAL STONE STUDY  Result Date: 06/28/2021 CLINICAL DATA:  Left-sided flank pain, fever for 3 days EXAM: CT ABDOMEN AND PELVIS WITHOUT CONTRAST TECHNIQUE: Multidetector CT imaging of the abdomen and pelvis was performed following the standard protocol without IV contrast. RADIATION DOSE REDUCTION: This exam was performed according to the departmental dose-optimization program which includes automated exposure control, adjustment of the mA and/or kV according to patient size and/or use of iterative reconstruction technique. COMPARISON:  02/03/2017 FINDINGS: Lower chest: No acute pleural or parenchymal lung disease. Hepatobiliary: Unremarkable unenhanced appearance of the liver and gallbladder. Pancreas: Unremarkable unenhanced appearance. Spleen: Unremarkable unenhanced appearance.  Adrenals/Urinary Tract: There is an obstructing 6 mm calculus within the distal left ureter reference image 74/2. There is moderate left-sided hydronephrosis and hydroureter, with left perinephric fat stranding and left renal edema. The right kidney is unremarkable. The adrenals and bladder are unremarkable. Stomach/Bowel: No bowel obstruction or ileus. Scattered sigmoid diverticulosis without diverticulitis. Normal appendix right lower quadrant. No bowel wall thickening or inflammatory change. Vascular/Lymphatic: No significant vascular findings are present. No enlarged abdominal or pelvic lymph nodes. Reproductive: Uterus and bilateral adnexa are unremarkable. Other: Trace pelvic free fluid likely physiologic. No free intraperitoneal gas. No abdominal wall hernia. Musculoskeletal: No acute or destructive bony lesions. Reconstructed images demonstrate no additional findings. IMPRESSION: 1. Obstructing 6 mm distal left ureteral calculus, with moderate left-sided obstructive uropathy. 2. Trace pelvic free fluid, likely physiologic. Electronically Signed   By: Randa Ngo M.D.   On: 06/28/2021 19:21    Microbiology: Results for orders placed or performed during the hospital encounter of 06/28/21  Resp Panel by RT-PCR (Flu A&B, Covid) Nasopharyngeal Swab     Status: None   Collection Time: 06/28/21  3:51 PM   Specimen: Nasopharyngeal Swab; Nasopharyngeal(NP) swabs in vial transport medium  Result Value Ref Range Status   SARS Coronavirus 2 by RT PCR NEGATIVE NEGATIVE Final    Comment: (NOTE) SARS-CoV-2 target nucleic acids are NOT DETECTED.  The SARS-CoV-2 RNA is generally detectable in upper respiratory specimens during the acute phase of infection. The lowest concentration of SARS-CoV-2 viral copies this  assay can detect is 138 copies/mL. A negative result does not preclude SARS-Cov-2 infection and should not be used as the sole basis for treatment or other patient management decisions. A negative  result may occur with  improper specimen collection/handling, submission of specimen other than nasopharyngeal swab, presence of viral mutation(s) within the areas targeted by this assay, and inadequate number of viral copies(<138 copies/mL). A negative result must be combined with clinical observations, patient history, and epidemiological information. The expected result is Negative.  Fact Sheet for Patients:  EntrepreneurPulse.com.au  Fact Sheet for Healthcare Providers:  IncredibleEmployment.be  This test is no t yet approved or cleared by the [place] FDA and  has been authorized for detection and/or diagnosis of SARS-CoV-2 by FDA under an Emergency Use Authorization (EUA). This EUA will remain  in effect (meaning this test can be used) for the duration of the COVID-19 declaration under Section 564(b)(1) of the Act, 21 U.S.C.section 360bbb-3(b)(1), unless the authorization is terminated  or revoked sooner.       Influenza A by PCR NEGATIVE NEGATIVE Final   Influenza B by PCR NEGATIVE NEGATIVE Final    Comment: (NOTE) The Xpert Xpress SARS-CoV-2/FLU/RSV plus assay is intended as an aid in the diagnosis of influenza from Nasopharyngeal swab specimens and should not be used as a sole basis for treatment. Nasal washings and aspirates are unacceptable for Xpert Xpress SARS-CoV-2/FLU/RSV testing.  Fact Sheet for Patients: EntrepreneurPulse.com.au  Fact Sheet for Healthcare Providers: IncredibleEmployment.be  This test is not yet approved or cleared by the [place] FDA and has been authorized for detection and/or diagnosis of SARS-CoV-2 by FDA under an Emergency Use Authorization (EUA). This EUA will remain in effect (meaning this test can be used) for the duration of the COVID-19 declaration under Section 564(b)(1) of the Act, 21 U.S.C. section 360bbb-3(b)(1), unless the authorization is  terminated or revoked.  Performed at [facility], [address] [place], [place] [address]   Culture, blood (Routine x 2)     Status: Abnormal   Collection Time: 06/28/21  4:03 PM   Specimen: Right Antecubital; Blood  Result Value Ref Range Status   Specimen Description   Final    RIGHT ANTECUBITAL Performed at [facility], [address], [place], [place] [address]    Special Requests   Final    BOTTLES DRAWN AEROBIC AND ANAEROBIC Blood Culture adequate volume Performed at [facility], [address], [place], [place] [address]    Culture  Setup Time   Final    BOTTLES DRAWN AEROBIC AND ANAEROBIC GRAM NEGATIVE RODS Gram Stain Report Called to,Read Back By and Verified With: BROWN, B 3.2.23 BY MATTHEWS, B 0555 CRITICAL RESULT CALLED TO, READ BACK BY AND VERIFIED WITH: E,MADUEME PHARMD _0  06/29/21 EB Performed at [facility], [place] [address] [place], [place] [address]    Culture ESCHERICHIA COLI (A)  Final   Report Status 07/01/2021 FINAL  Final   Organism ID, Bacteria ESCHERICHIA COLI  Final      Susceptibility   Escherichia coli - MIC*    AMPICILLIN >=32 RESISTANT Resistant     CEFAZOLIN 8 SENSITIVE Sensitive     CEFEPIME <=0.12 SENSITIVE Sensitive     CEFTAZIDIME <=1 SENSITIVE Sensitive     CEFTRIAXONE 0.5 SENSITIVE Sensitive     CIPROFLOXACIN <=0.25 SENSITIVE Sensitive     GENTAMICIN <=1 SENSITIVE Sensitive     IMIPENEM <=0.25 SENSITIVE Sensitive     TRIMETH/SULFA <=20 SENSITIVE Sensitive  AMPICILLIN/SULBACTAM >=32 RESISTANT Resistant     PIP/TAZO <=4 SENSITIVE Sensitive     * ESCHERICHIA COLI  Blood Culture ID Panel (Reflexed)     Status: Abnormal   Collection Time: 06/28/21  4:03 PM  Result Value Ref Range Status   Enterococcus faecalis NOT DETECTED NOT DETECTED Final   Enterococcus Faecium NOT DETECTED NOT DETECTED Final   Listeria monocytogenes NOT DETECTED NOT DETECTED Final   Staphylococcus species NOT DETECTED NOT DETECTED Final   Staphylococcus  aureus (BCID) NOT DETECTED NOT DETECTED Final   Staphylococcus epidermidis NOT DETECTED NOT DETECTED Final   Staphylococcus lugdunensis NOT DETECTED NOT DETECTED Final   Streptococcus species NOT DETECTED NOT DETECTED Final   Streptococcus agalactiae NOT DETECTED NOT DETECTED Final   Streptococcus pneumoniae NOT DETECTED NOT DETECTED Final   Streptococcus pyogenes NOT DETECTED NOT DETECTED Final   A.calcoaceticus-baumannii NOT DETECTED NOT DETECTED Final   Bacteroides fragilis NOT DETECTED NOT DETECTED Final   Enterobacterales DETECTED (A) NOT DETECTED Final    Comment: Enterobacterales represent a large order of gram negative bacteria, not a single organism. CRITICAL RESULT CALLED TO, READ BACK BY AND VERIFIED WITH: E,MADUEME PHARMD _0  06/29/21 EB    Enterobacter cloacae complex NOT DETECTED NOT DETECTED Final   Escherichia coli DETECTED (A) NOT DETECTED Final    Comment: CRITICAL RESULT CALLED TO, READ BACK BY AND VERIFIED WITH: E,MADUEME PHARMD _1  06/29/21 EB    Klebsiella aerogenes NOT DETECTED NOT DETECTED Final   Klebsiella oxytoca NOT DETECTED NOT DETECTED Final   Klebsiella pneumoniae NOT DETECTED NOT DETECTED Final   Proteus species NOT DETECTED NOT DETECTED Final   Salmonella species NOT DETECTED NOT DETECTED Final   Serratia marcescens NOT DETECTED NOT DETECTED Final   Haemophilus influenzae NOT DETECTED NOT DETECTED Final   Neisseria meningitidis NOT DETECTED NOT DETECTED Final   Pseudomonas aeruginosa NOT DETECTED NOT DETECTED Final   Stenotrophomonas maltophilia NOT DETECTED NOT DETECTED Final   Candida albicans NOT DETECTED NOT DETECTED Final   Candida auris NOT DETECTED NOT DETECTED Final   Candida glabrata NOT DETECTED NOT DETECTED Final   Candida krusei NOT DETECTED NOT DETECTED Final   Candida parapsilosis NOT DETECTED NOT DETECTED Final   Candida tropicalis NOT DETECTED NOT DETECTED Final   Cryptococcus neoformans/gattii NOT DETECTED NOT DETECTED Final    CTX-M ESBL NOT DETECTED NOT DETECTED Final   Carbapenem resistance IMP NOT DETECTED NOT DETECTED Final   Carbapenem resistance KPC NOT DETECTED NOT DETECTED Final   Carbapenem resistance NDM NOT DETECTED NOT DETECTED Final   Carbapenem resist OXA 48 LIKE NOT DETECTED NOT DETECTED Final   Carbapenem resistance VIM NOT DETECTED NOT DETECTED Final    Comment: Performed at [facility], [address], [place], [place] [address]  Culture, blood (Routine x 2)     Status: Abnormal   Collection Time: 06/28/21  4:04 PM   Specimen: BLOOD RIGHT HAND  Result Value Ref Range Status   Specimen Description   Final    BLOOD RIGHT HAND Performed at [facility], [address], [place], [place] [address]    Special Requests   Final    BOTTLES DRAWN AEROBIC AND ANAEROBIC Blood Culture adequate volume Performed at [facility], [address] [place], [place] [address]    Culture  Setup Time   Final    BOTTLES DRAWN AEROBIC AND ANAEROBIC GRAM NEGATIVE RODS Gram Stain Report Called to,Read Back By and Verified With: BROWN,B_2  BY MATTHEWS, B3.2.23 CRITICAL VALUE NOTED.  VALUE IS CONSISTENT WITH PREVIOUSLY REPORTED AND CALLED VALUE.    Culture (A)  Final    ESCHERICHIA COLI SUSCEPTIBILITIES PERFORMED ON PREVIOUS CULTURE WITHIN THE LAST 5 DAYS. Performed at [facility], [place] [address] [place], [place] [address]    Report Status 07/01/2021 FINAL  Final  Urine Culture     Status: Abnormal   Collection Time: 06/28/21  5:03 PM   Specimen: Urine, Clean Catch  Result Value Ref Range Status   Specimen Description   Final    URINE, CLEAN CATCH Performed at [facility], [address] [place], [place] [address]    Special Requests   Final    NONE Performed at [facility], [address] [place], [place] [address]    Culture 50,000 COLONIES/mL ESCHERICHIA COLI (A)  Final   Report Status 07/01/2021 FINAL  Final   Organism ID, Bacteria ESCHERICHIA COLI (A)  Final      Susceptibility    Escherichia coli - MIC*    AMPICILLIN >=32 RESISTANT Resistant     CEFAZOLIN 8 SENSITIVE Sensitive     CEFEPIME <=0.12 SENSITIVE Sensitive     CEFTRIAXONE 0.5 SENSITIVE Sensitive     CIPROFLOXACIN <=0.25 SENSITIVE Sensitive     GENTAMICIN <=1 SENSITIVE Sensitive     IMIPENEM <=0.25 SENSITIVE Sensitive     NITROFURANTOIN 32 SENSITIVE Sensitive     TRIMETH/SULFA <=20 SENSITIVE Sensitive     AMPICILLIN/SULBACTAM >=32 RESISTANT Resistant     PIP/TAZO <=4 SENSITIVE Sensitive     * 50,000 COLONIES/mL ESCHERICHIA COLI    Labs: CBC: Recent Labs  Lab 06/28/21 1603 06/29/21 0429 06/30/21 0459 07/02/21 0339  WBC 15.8* 33.0* 27.6* 14.5*  NEUTROABS 14.9*  --   --   --   HGB 12.6 12.3 11.2* 11.2*  HCT 38.4 39.1 35.3* 33.9*  MCV 89.3 92.4 90.3 89.7  PLT 275 246 254 627   Basic Metabolic Panel: Recent Labs  Lab 06/28/21 1603 06/29/21 0429 06/30/21 0459 07/02/21 0339  NA 135 141 137 137  K 2.9* 3.5 3.4* 3.4*  CL 102 109 108 104  CO2 _0 GLUCOSE 169* 86 143* 101*  BUN _1 5*  CREATININE 1.19* 0.81 0.74 0.64  CALCIUM 8.3* 8.2* 8.1* 8.2*  MG  --   --   --  1.6*   Liver Function Tests: Recent Labs  Lab 06/28/21 1603  AST 16  ALT 9  ALKPHOS 83  BILITOT 0.9  PROT 7.4  ALBUMIN 3.4*    Discharge time spent: greater than 30 minutes.  Signed: Barton Dubois, MD [facility] 07/02/2021

## 2021-07-03 ENCOUNTER — Telehealth: Payer: Self-pay | Admitting: *Deleted

## 2021-07-03 ENCOUNTER — Telehealth: Payer: Self-pay

## 2021-07-03 NOTE — Telephone Encounter (Signed)
Transition Care Management Follow-up Telephone Call ?Date of discharge and from where: 07-02-21 [name]  ?How have you been since you were released from the hospital? Been doing good ?Any questions or concerns? No ? ?Items Reviewed: ?Did the pt receive and understand the discharge instructions provided? Yes  ?Medications obtained and verified? Yes  ?Other? No  ?Any new allergies since your discharge? No  ?Dietary orders reviewed? Yes ?Do you have support at home? Yes  ? ?Home Care and Equipment/Supplies: ?Were home health services ordered? not applicable ?If so, what is the name of the agency? [facility]  ?Has the agency set up a time to come to the patient's home? not applicable ?Were any new equipment or medical supplies ordered?  No ?What is the name of the medical supply agency? [facility] ?Were you able to get the supplies/equipment? not applicable ?Do you have any questions related to the use of the equipment or supplies? No ? ?Functional Questionnaire: (I = Independent and D = Dependent) ?ADLs: i ? ?Bathing/Dressing- i ? ?Meal Prep- i ? ?Eating- i ? ?Maintaining continence- i ? ?Transferring/Ambulation- i ? ?Managing Meds- i ? ?Follow up appointments reviewed: ? ?PCP Hospital f/u appt confirmed? Yes  Scheduled to see Kristin Bruins on 07-14-21 @ 2:40. ?[facility] f/u appt confirmed? yes ?Are transportation arrangements needed? No  ?If their condition worsens, is the pt aware to call PCP or go to the Emergency Dept.? Yes ?Was the patient provided with contact information for the PCP's office or ED? Yes ?Was to pt encouraged to call back with questions or concerns? Yes ? ?

## 2021-07-03 NOTE — Telephone Encounter (Signed)
I spoke with [place]. We have discussed possible surgery dates and Thursday March 30th, 2023 was agreed upon by all parties. Patient given information about surgery date, what to expect pre-operatively and post operatively.  ? ?We discussed that a pre-op nurse will be calling to set up the pre-op visit that will take place prior to surgery. Informed patient that our office will communicate any additional care to be provided after surgery.  ? ?Patients questions or concerns were discussed during our call. Advised to call our office should there be any additional information, questions or concerns that arise. Patient verbalized understanding.  ? ?

## 2021-07-03 NOTE — Progress Notes (Signed)
[facility] Posting Form  ? ?Surgery Date/Time: Date: 07/27/2021 ? ?Surgeon: Dr. Nicolette Bang, MD ? ?Surgery Location: Day Surgery ? ?Inpt ( No  )   Outpt (Yes)   Obs ( No  )  ? ?Diagnosis: N20.0 Left Nephrolithiasis ? ?-CPT: [id number], [phone] ? ?Surgery: Left Ureteroscopy with laser lithotripsy and stent exchange with left retrograde pyelogram ? ?Stop Anticoagulations: No ? ?Cardiac/Medical/Pulmonary Clearance needed: no ? ?*Orders entered into EPIC  Date: 07/03/21  ? ?*Case booked in [place]  Date: 07/03/21 ? ?*Notified pt of Surgery: Date: 07/03/21 ? ?PRE-OP UA & CX: no ? ?*Placed into Prior Authorization Work Que Date: 07/03/21 ? ? ?Assistant/laser/rep:No ? ? ? ? ? ? ? ? ? ? ? ? ? ? ? ?

## 2021-07-04 ENCOUNTER — Encounter (HOSPITAL_COMMUNITY): Payer: Self-pay | Admitting: Urology

## 2021-07-10 ENCOUNTER — Emergency Department (HOSPITAL_COMMUNITY): Payer: Medicaid Other

## 2021-07-10 ENCOUNTER — Other Ambulatory Visit: Payer: Self-pay

## 2021-07-10 ENCOUNTER — Emergency Department (HOSPITAL_COMMUNITY)
Admission: EM | Admit: 2021-07-10 | Discharge: 2021-07-10 | Disposition: A | Discharge status: Home / Self Care | Payer: Medicaid Other | Attending: Emergency Medicine | Admitting: Emergency Medicine

## 2021-07-10 ENCOUNTER — Encounter: Payer: Self-pay | Admitting: Urology

## 2021-07-10 ENCOUNTER — Encounter (HOSPITAL_COMMUNITY): Payer: Self-pay

## 2021-07-10 DIAGNOSIS — N132 Hydronephrosis with renal and ureteral calculous obstruction: Secondary | ICD-10-CM | POA: Diagnosis not present

## 2021-07-10 DIAGNOSIS — R319 Hematuria, unspecified: Secondary | ICD-10-CM | POA: Diagnosis present

## 2021-07-10 DIAGNOSIS — N200 Calculus of kidney: Secondary | ICD-10-CM

## 2021-07-10 DIAGNOSIS — Z79899 Other long term (current) drug therapy: Secondary | ICD-10-CM | POA: Diagnosis not present

## 2021-07-10 DIAGNOSIS — I10 Essential (primary) hypertension: Secondary | ICD-10-CM | POA: Insufficient documentation

## 2021-07-10 DIAGNOSIS — Z466 Encounter for fitting and adjustment of urinary device: Secondary | ICD-10-CM | POA: Diagnosis not present

## 2021-07-10 LAB — CBC WITH DIFFERENTIAL/PLATELET
Abs Immature Granulocytes: 0.02 10*3/uL (ref 0.00–0.07)
Basophils Absolute: 0.1 10*3/uL (ref 0.0–0.1)
Basophils Relative: 1 %
Eosinophils Absolute: 0.3 10*3/uL (ref 0.0–0.5)
Eosinophils Relative: 3 %
HCT: 38.4 % (ref 36.0–46.0)
Hemoglobin: 12.4 g/dL (ref 12.0–15.0)
Immature Granulocytes: 0 %
Lymphocytes Relative: 24 %
Lymphs Abs: 2.1 10*3/uL (ref 0.7–4.0)
MCH: 29.5 pg (ref 26.0–34.0)
MCHC: 32.3 g/dL (ref 30.0–36.0)
MCV: 91.4 fL (ref 80.0–100.0)
Monocytes Absolute: 0.5 10*3/uL (ref 0.1–1.0)
Monocytes Relative: 5 %
Neutro Abs: 6.1 10*3/uL (ref 1.7–7.7)
Neutrophils Relative %: 67 %
Platelets: 373 10*3/uL (ref 150–400)
RBC: 4.2 MIL/uL (ref 3.87–5.11)
RDW: 14 % (ref 11.5–15.5)
WBC: 9 10*3/uL (ref 4.0–10.5)
nRBC: 0 % (ref 0.0–0.2)

## 2021-07-10 LAB — URINALYSIS, ROUTINE W REFLEX MICROSCOPIC
Bacteria, UA: NONE SEEN
Bilirubin Urine: NEGATIVE
Glucose, UA: 50 mg/dL — AB
Ketones, ur: 5 mg/dL — AB
Nitrite: NEGATIVE
Protein, ur: >=300 mg/dL — AB
RBC / HPF: >50 RBC/hpf — ABNORMAL HIGH (ref 0–5)
Specific Gravity, Urine: 1.02 (ref 1.005–1.030)
WBC, UA: >50 WBC/hpf — ABNORMAL HIGH (ref 0–5)
pH: 7 (ref 5.0–8.0)

## 2021-07-10 LAB — COMPREHENSIVE METABOLIC PANEL
ALT: 8 U/L (ref 0–44)
AST: 9 U/L — ABNORMAL LOW (ref 15–41)
Albumin: 3.2 g/dL — ABNORMAL LOW (ref 3.5–5.0)
Alkaline Phosphatase: 80 U/L (ref 38–126)
Anion gap: 7 (ref 5–15)
BUN: 12 mg/dL (ref 6–20)
CO2: 26 mmol/L (ref 22–32)
Calcium: 8.5 mg/dL — ABNORMAL LOW (ref 8.9–10.3)
Chloride: 103 mmol/L (ref 98–111)
Creatinine, Ser: 0.75 mg/dL (ref 0.44–1.00)
GFR, Estimated: >60 mL/min (ref >60)
Glucose, Bld: 107 mg/dL — ABNORMAL HIGH (ref 70–99)
Potassium: 3.6 mmol/L (ref 3.5–5.1)
Sodium: 136 mmol/L (ref 135–145)
Total Bilirubin: 0.4 mg/dL (ref 0.3–1.2)
Total Protein: 7.1 g/dL (ref 6.5–8.1)

## 2021-07-10 LAB — POC URINE PREG, ED: Preg Test, Ur: NEGATIVE

## 2021-07-10 MED ORDER — CIPROFLOXACIN IN D5W 400 MG/200ML IV SOLN
400.0000 mg | Freq: Once | INTRAVENOUS | Status: AC
  Administered 2021-07-10: 400 mg via INTRAVENOUS
  Filled 2021-07-10: qty 200

## 2021-07-10 MED ORDER — ONDANSETRON HCL 4 MG/2ML IJ SOLN
4.0000 mg | Freq: Once | INTRAMUSCULAR | Status: AC
  Administered 2021-07-10: 4 mg via INTRAVENOUS
  Filled 2021-07-10: qty 2

## 2021-07-10 MED ORDER — HYDROMORPHONE HCL 1 MG/ML IJ SOLN
0.5000 mg | Freq: Once | INTRAMUSCULAR | Status: AC
  Administered 2021-07-10: 0.5 mg via INTRAVENOUS
  Filled 2021-07-10: qty 1

## 2021-07-10 MED ORDER — KETOROLAC TROMETHAMINE 30 MG/ML IJ SOLN
30.0000 mg | Freq: Once | INTRAMUSCULAR | Status: AC
  Administered 2021-07-10: 30 mg via INTRAVENOUS
  Filled 2021-07-10: qty 1

## 2021-07-10 NOTE — Discharge Instructions (Signed)
Get in touch with Dr. Alyson Ingles for any problems.  He is calling in some pain medicine for you.  Continue taking the antibiotic ?

## 2021-07-10 NOTE — ED Provider Notes (Incomplete)
[facility] EMERGENCY DEPARTMENT Provider Note   CSN: [id number] Arrival date & time: 07/10/21  1884     History {Add pertinent medical, surgical, social history, OB history to HPI:1} Chief Complaint  Patient presents with   Flank Pain   Hematuria    [name] is a 41 y.o. female.  Patient with a history of hypertension and kidney stone.  She is complaining of some blood in her urine and flank pain   Flank Pain  Hematuria      Home Medications Prior to Admission medications   Medication Sig Start Date End Date Taking? Authorizing Provider  acetaminophen (TYLENOL) 500 MG tablet Take 1,000 mg by mouth every 6 (six) hours as needed for mild pain.   Yes [provider]  albuterol (PROVENTIL HFA) 108 (90 Base) MCG/ACT inhaler Inhale 2 puffs into the lungs every 6 (six) hours as needed for wheezing (cough). 09/30/20  Yes [name], MD  amLODipine (NORVASC) 5 MG tablet Take 1 tablet (5 mg total) by mouth daily. 04/19/21  Yes [name], MD  cefdinir (OMNICEF) 300 MG capsule Take 1 capsule (300 mg total) by mouth every 12 (twelve) hours for 11 days. 07/02/21 07/13/21 Yes Barton Dubois, MD  EPINEPHrine 0.3 mg/0.3 mL IJ SOAJ injection Inject 0.3 mg into the muscle as needed for anaphylaxis. 12/07/20  Yes Althea Charon, FNP  fluticasone (FLONASE) 50 MCG/ACT nasal spray Place 2 sprays into both nostrils daily as needed for allergies or rhinitis.   Yes [provider]  fluticasone-salmeterol (ADVAIR HFA) 115-21 MCG/ACT inhaler Inhale 2 puffs into the lungs 2 (two) times daily. Patient taking differently: Inhale 2 puffs into the lungs daily. 09/30/20  Yes [name], MD  hydrochlorothiazide (MICROZIDE) 12.5 MG capsule Take 1 capsule (12.5 mg total) by mouth daily. TAKE (1) CAPSULE BY MOUTH ONCE EVERY DAY. Strength: 12.5 mg 05/24/21  Yes Patel, Colin Broach, MD  Olopatadine HCl 0.2 % SOLN Use 1 drop in each eye once a day as needed for itchy watery  eyes Patient taking differently: Place 1 drop into both eyes every 6 (six) hours as needed (itchy watery eyes). 12/07/20  Yes Althea Charon, FNP  ondansetron (ZOFRAN-ODT) 8 MG disintegrating tablet Take 1 tablet (8 mg total) by mouth every 8 (eight) hours as needed for nausea or vomiting. 07/02/21  Yes Barton Dubois, MD  oxyCODONE (OXY IR/ROXICODONE) 5 MG immediate release tablet Take 1 tablet (5 mg total) by mouth every 6 (six) hours as needed for moderate pain. 07/02/21  Yes Barton Dubois, MD  potassium chloride SA (KLOR-CON M) 20 MEQ tablet Take 1 tablet (20 mEq total) by mouth daily. 07/02/21  Yes Barton Dubois, MD  rOPINIRole (REQUIP) 1 MG tablet Take 1 tablet (1 mg total) by mouth at bedtime. 07/02/21  Yes Barton Dubois, MD  zolpidem (AMBIEN) 10 MG tablet TAKE (1) TABLET BY MOUTH AT BEDTIME. Patient not taking: No sig reported 02/28/16 11/12/18  Alycia Rossetti, MD      Allergies    Banana and Pineapple    Review of Systems   Review of Systems  Genitourinary:  Positive for flank pain and hematuria.   Physical Exam Updated Vital Signs BP 119/70    Pulse 80    Temp 98.2 F (36.8 C)    Resp 16    Ht '5\' 5"'$  (1.651 m)    Wt 122.5 kg    SpO2 100%    BMI 44.93 kg/m  Physical Exam  ED  Results / Procedures / Treatments   Labs (all labs ordered are listed, but only abnormal results are displayed) Labs Reviewed  URINALYSIS, ROUTINE W REFLEX MICROSCOPIC - Abnormal; Notable for the following components:      Result Value   Color, Urine AMBER (*)    APPearance CLOUDY (*)    Glucose, UA 50 (*)    Hgb urine dipstick LARGE (*)    Ketones, ur 5 (*)    Protein, ur >=300 (*)    Leukocytes,Ua SMALL (*)    RBC / HPF >50 (*)    WBC, UA >50 (*)    All other components within normal limits  COMPREHENSIVE METABOLIC PANEL - Abnormal; Notable for the following components:   Glucose, Bld 107 (*)    Calcium 8.5 (*)    Albumin 3.2 (*)    AST 9 (*)    All other components within normal limits  URINE  CULTURE  CBC WITH DIFFERENTIAL/PLATELET  POC URINE PREG, ED    EKG None  Radiology CT Renal Stone Study  Result Date: 07/10/2021 CLINICAL DATA:  Left-sided flank pain and hematuria. EXAM: CT ABDOMEN AND PELVIS WITHOUT CONTRAST TECHNIQUE: Multidetector CT imaging of the abdomen and pelvis was performed following the standard protocol without IV contrast. RADIATION DOSE REDUCTION: This exam was performed according to the departmental dose-optimization program which includes automated exposure control, adjustment of the mA and/or kV according to patient size and/or use of iterative reconstruction technique. COMPARISON:  CT abdomen pelvis dated June 28, 2021. FINDINGS: Lower chest: No acute abnormality. Hepatobiliary: No focal liver abnormality is seen. No gallstones, gallbladder wall thickening, or biliary dilatation. Pancreas: Unremarkable. No pancreatic ductal dilatation or surrounding inflammatory changes. Spleen: Normal in size without focal abnormality. Adrenals/Urinary Tract: Adrenal glands and right kidney are unremarkable. Unchanged 5 mm calculus in the distal left ureter just proximal to the UVJ. New double-J left ureteral stent in appropriate position with improved now minimal to mild left hydroureteronephrosis. Improved left perinephric and periureteral fat stranding. Unchanged punctate calculus in the upper pole of the left kidney. Bladder is unremarkable. Stomach/Bowel: Stomach is within normal limits. Appendix appears normal. No evidence of bowel wall thickening, distention, or inflammatory changes. Vascular/Lymphatic: No significant vascular findings are present. No enlarged abdominal or pelvic lymph nodes. Reproductive: Uterus and bilateral adnexa are unremarkable. Other: Similar trace free fluid in the pelvis is likely physiologic. No pneumoperitoneum. Musculoskeletal: No acute or significant osseous findings. IMPRESSION: 1. Unchanged 5 mm calculus in the distal left ureter with improved now  minimal to mild left hydroureteronephrosis status post left ureteral stent placement. 2. Unchanged punctate left nephrolithiasis. Electronically Signed   By: Titus Dubin M.D.   On: 07/10/2021 10:49    Procedures Procedures  {Document cardiac monitor, telemetry assessment procedure when appropriate:1}  Medications Ordered in ED Medications  HYDROmorphone (DILAUDID) injection 0.5 mg (0.5 mg Intravenous Given 07/10/21 1019)  ondansetron (ZOFRAN) injection 4 mg (4 mg Intravenous Given 07/10/21 1019)  ciprofloxacin (CIPRO) IVPB 400 mg (0 mg Intravenous Stopped 07/10/21 1147)  ketorolac (TORADOL) 30 MG/ML injection 30 mg (30 mg Intravenous Given 07/10/21 1223)    ED Course/ Medical Decision Making/ A&P I spoke with urology and they stated to give the patient some Toradol.  They do not want change to the antibiotic.  Dr. Alyson Ingles will call in some pain medicin in                         Medical Decision  Making Amount and/or Complexity of Data Reviewed Labs: ordered. Radiology: ordered.  Risk Prescription drug management.   Kidney stone with stent in place  {Document critical care time when appropriate:1} {Document review of labs and clinical decision tools ie heart score, Chads2Vasc2 etc:1}  {Document your independent review of radiology images, and any outside records:1} {Document your discussion with family members, caretakers, and with consultants:1} {Document social determinants of health affecting pt's care:1} {Document your decision making why or why not admission, treatments were needed:1} Final Clinical Impression(s) / ED Diagnoses Final diagnoses:  Kidney stone    Rx / DC Orders ED Discharge Orders     None

## 2021-07-10 NOTE — ED Triage Notes (Signed)
Pt states she was seen on the 1st for flank pain. Pt reports she had to have a stent placed in left kidney due to a kidney stone. Pt reports last night she noticed blood in her urine and c/o continued left flank pain. ? ?

## 2021-07-10 NOTE — ED Notes (Signed)
DR. Alyson Ingles called and returned called to DR.ZammitLattie Haw ?

## 2021-07-11 ENCOUNTER — Telehealth: Payer: Self-pay

## 2021-07-11 ENCOUNTER — Encounter: Payer: Self-pay | Admitting: Urology

## 2021-07-11 LAB — URINE CULTURE: Culture: <10000 — AB

## 2021-07-11 NOTE — Telephone Encounter (Signed)
Transition Care Management Follow-up Telephone Call ?Date of discharge and from where: 07/10/2021 from [facility] ?How have you been since you were released from the hospital? Patient stated that she is still having the same symptoms. Patient states that she is still in pain and she is still urinating blood. Patient states that the color of urine is starting to look more of bright bed than the darker red she was seeing. Patient stated the pain has not worsened but it still at a 10 of 10(being the worst). Patient stated that Tylenol lessens the pain to a 5 of 10 (being the worst).  ?Any questions or concerns? No ? ?Items Reviewed: ?Did the pt receive and understand the discharge instructions provided? Yes  ?Medications obtained and verified? Yes  ?Other? No  ?Any new allergies since your discharge? No  ?Dietary orders reviewed? No ?Do you have support at home? Yes  ? ?Functional Questionnaire: (I = Independent and D = Dependent) ?ADLs: I ? ?Bathing/Dressing- I ? ?Meal Prep- I ? ?Eating- I ? ?Maintaining continence- I ? ?Transferring/Ambulation- I ? ?Managing Meds- I ? ? ?Follow up appointments reviewed: ? ?PCP Hospital f/u appt confirmed? No   ?Specialist Hospital f/u appt confirmed? Yes  Scheduled to see Cystoscopy with Retrograde Pyelogram Ureteroscopy on 07/27/2021 @ 12:45 pm. ?Are transportation arrangements needed? No  ?If their condition worsens, is the pt aware to call PCP or go to the Emergency Dept.? Yes ?Was the patient provided with contact information for the PCP's office or ED? Yes ?Was to pt encouraged to call back with questions or concerns? Yes ? ?

## 2021-07-11 NOTE — Telephone Encounter (Signed)
Patient called office asking for pain medication refill. ? ?Message sent to MD ?

## 2021-07-11 NOTE — Telephone Encounter (Signed)
Please refill meds if appropriate ?

## 2021-07-11 NOTE — Telephone Encounter (Signed)
Patient called back.  She is not able to make it through the night without more pain medication.  She states that she only has 1 pill left. ?

## 2021-07-12 ENCOUNTER — Other Ambulatory Visit: Payer: Self-pay | Admitting: Urology

## 2021-07-12 ENCOUNTER — Encounter: Payer: Self-pay | Admitting: Internal Medicine

## 2021-07-12 MED ORDER — OXYCODONE HCL 5 MG PO TABS: 5.0000 mg | ORAL_TABLET | Freq: Four times a day (QID) | ORAL | 0 refills | Status: DC | PRN

## 2021-07-12 NOTE — Telephone Encounter (Signed)
Spoke with pt advised to go to urgent care or ER as Friday was the first available appt but to also keep this appt on Friday with verbal understanding  ?

## 2021-07-14 ENCOUNTER — Encounter: Payer: Self-pay | Admitting: Nurse Practitioner

## 2021-07-14 ENCOUNTER — Ambulatory Visit: Payer: Medicaid Other | Admitting: Nurse Practitioner

## 2021-07-14 ENCOUNTER — Other Ambulatory Visit: Payer: Self-pay

## 2021-07-14 VITALS — BP 118/80 | HR 97 | Ht 65.0 in | Wt 281.0 lb

## 2021-07-14 DIAGNOSIS — N12 Tubulo-interstitial nephritis, not specified as acute or chronic: Secondary | ICD-10-CM

## 2021-07-14 DIAGNOSIS — Z09 Encounter for follow-up examination after completed treatment for conditions other than malignant neoplasm: Secondary | ICD-10-CM | POA: Diagnosis not present

## 2021-07-14 DIAGNOSIS — N179 Acute kidney failure, unspecified: Secondary | ICD-10-CM

## 2021-07-14 DIAGNOSIS — I10 Essential (primary) hypertension: Secondary | ICD-10-CM

## 2021-07-14 NOTE — Assessment & Plan Note (Signed)
BP Readings from Last 3 Encounters:  ?07/14/21 118/80  ?07/10/21 119/70  ?07/02/21 132/90  ?Condition well-controlled, continue amlodipine 5 mg daily ?DASH diet advised ?

## 2021-07-14 NOTE — Assessment & Plan Note (Addendum)
follow up for  hospital admission for pyelonephritis, nephrolithiasis AKI on 06/28/2021 to 07/02/2021.  Patient stated that she had to go back to the ER 4 days ago, because is still having blood in her urine with left flank pain. Pt states that she  has pressure with urination, feels like her bladder is not emptying completely. She has had a left urethra stent placed by urology while on admission at the hospital.   ? ?Discharge summary labs and imaging done at the hospital Today. ?has an upcoming surgery for ureteroscopy and stone extraction 3/30. ?Continue as needed pain medications as needed.  ? ?

## 2021-07-14 NOTE — Assessment & Plan Note (Signed)
Lab Results  ?Component Value Date  ? NA 136 07/10/2021  ? K 3.6 07/10/2021  ? CO2 26 07/10/2021  ? GLUCOSE 107 (H) 07/10/2021  ? BUN 12 07/10/2021  ? CREATININE 0.75 07/10/2021  ? CALCIUM 8.5 (L) 07/10/2021  ? EGFR 93 09/28/2020  ? GFRNONAA >60 07/10/2021  ?Creatinine and EGFR are normal  ?Drink at least 64 ounces of water daily to maintain hydration.  ?Has scheduled kidney stone extraction in 2 weeks ?

## 2021-07-14 NOTE — Progress Notes (Signed)
? ?[name]     MRN: [id number]      DOB: 09-19-1980 ? ? ?HPI ?[name] with past medical history of hypertension, asthma, GERD, recurrent UTIs is here for follow up for  hospital admission for pyelonephritis, nephrolithiasis AKI on 06/28/2021 to 07/02/2021.  Patient stated that she had to go back to the ER 4 days ago, because is still having blood in her urine with left flank pain. Pt states that she  has pressure with urination, feels like her bladder is not emptying completely, has right lower pelvic pain She has had a left urethra stent placed by urology while on admission at the hospital has an upcoming surgery for ureteroscopy and stone extraction 3/30. Pt denies fever, chills, body aches. Pt states that she has finished taking her omnicef 371m capsules, not IV Cipro floxacillin  when she went to the ER 4 days ago.  He has been taking pain medications as needed.  ? ? ? ?ROS ?Denies recent fever or chills. ?Denies sinus pressure, nasal congestion, ear pain or sore throat. ?Denies chest congestion, productive cough or wheezing. ?Denies chest pains, palpitations and leg swelling ?Denies  nausea, vomiting,diarrhea or constipation.   ?Denies joint pain, swelling and limitation in mobility. ?Denies depression, anxiety or insomnia. ? ? ? ?PE ? ?BP 118/80 (BP Location: Left Wrist, Patient Position: Sitting, Cuff Size: Large)   Pulse 97   Ht 5' 5"  (1.651 m)   Wt 281 lb (127.5 kg)   SpO2 100%   Breastfeeding No   BMI 46.76 kg/m?  ? ?Patient alert and oriented and in no cardiopulmonary distress. ? ?Chest: Clear to auscultation bilaterally. ? ?CVS: S1, S2 no murmurs, no S3.Regular rate. ? ?ABD: Soft, tender left lower quadrant ?Genitourinary . left flank pain on palpation ? ?MS: Adequate ROM spine, shoulders, hips and knees. ? ?Skin: Intact, no ulcerations or rash noted. ? ?Psych: Good eye contact, normal affect. Memory intact not anxious or depressed appearing. ? ?CNS: CN 2-12 intact, power,   normal throughout.no focal deficits noted. ? ? ?Assessment & Plan ?Hospital discharge follow-up ?follow up for  hospital admission for pyelonephritis, nephrolithiasis AKI on 06/28/2021 to 07/02/2021.  Patient stated that she had to go back to the ER 4 days ago, because is still having blood in her urine with left flank pain. Pt states that she  has pressure with urination, feels like her bladder is not emptying completely. She has had a left urethra stent placed by urology while on admission at the hospital.   ? ?Discharge summary labs and imaging done at the hospital Today. ?has an upcoming surgery for ureteroscopy and stone extraction 3/30. ?Continue as needed pain medications as needed.  ? ? ?Pyelonephritis ?Still has left flank pain and bloody urine. ?Has completed dose of cefdinir 300 MG capsule. ?Got IV Cipro in the hospital 4 days ago ?Upcoming ureteroscopy and stone extraction on 3/30 ?Continue oxycodone 5 mg every 6 hours as needed for pain.  Drink at least 64 ounces of water daily. ? ?AK[facility] kidney [facility] ?Lab Results  ?Component Value Date  ? NA 136 07/10/2021  ? K 3.6 07/10/2021  ? CO2 26 07/10/2021  ? GLUCOSE 107 (H) 07/10/2021  ? BUN 12 07/10/2021  ? CREATININE 0.75 07/10/2021  ? CALCIUM 8.5 (L) 07/10/2021  ? EGFR 93 09/28/2020  ? GFRNONAA >60 07/10/2021  ?Creatinine and EGFR are normal  ?Drink at least 64 ounces of water daily to maintain hydration.  ?Has scheduled kidney  stone extraction in 2 weeks ? ?Hypertension ?BP Readings from Last 3 Encounters:  ?07/14/21 118/80  ?07/10/21 119/70  ?07/02/21 132/90  ?Condition well-controlled, continue amlodipine 5 mg daily ?DASH diet advised  ?

## 2021-07-14 NOTE — Patient Instructions (Signed)

## 2021-07-14 NOTE — Assessment & Plan Note (Addendum)
Still has left flank pain and bloody urine. ?Has completed dose of cefdinir 300 MG capsule. ?Got IV Cipro in the hospital 4 days ago ?Upcoming ureteroscopy and stone extraction on 3/30 ?Continue oxycodone 5 mg every 6 hours as needed for pain.  Drink at least 64 ounces of water daily. ?

## 2021-07-19 NOTE — Patient Instructions (Addendum)
? Your procedure is scheduled on: 07/27/2021 ? Report to Opal Entrance at    11:15 AM. ? Call this number if you have problems the morning of surgery: [phone] ? ? Remember: ? ? Do not Eat or Drink after midnight  ? ?      No Smoking the morning of surgery ? : ? Take these medicines the morning of surgery with A SIP OF WATER: Amlodipine    ?            (ondansetron and/or oxycodone if needed) ?           ?            Use inhalers if needed ? ? Do not wear jewelry, make-up or nail polish. ? Do not wear lotions, powders, or perfumes. You may wear deodorant. ? Do not shave 48 hours prior to surgery. Men may shave face and neck. ? Do not bring valuables to the hospital. ? Contacts, dentures or bridgework may not be worn into surgery. ? Leave suitcase in the car. After surgery it may be brought to your room. ? For patients admitted to the hospital, checkout time is 11:00 AM the day of discharge. ? ? Patients discharged the day of surgery will not be allowed to drive home. ?  ? Special Instructions: Shower using CHG night before surgery and shower the day of surgery use CHG.  Use special wash - you have one bottle of CHG for all showers.  You should use approximately 1/2 of the bottle for each shower.  ?How to Use Chlorhexidine for Bathing ?Chlorhexidine gluconate (CHG) is a germ-killing (antiseptic) solution that is used to clean the skin. It can get rid of the bacteria that normally live on the skin and can keep them away for about 24 hours. To clean your skin with CHG, you may be given: ?A CHG solution to use in the shower or as part of a sponge bath. ?A prepackaged cloth that contains CHG. ?Cleaning your skin with CHG may help lower the risk for infection: ?While you are staying in the intensive care unit of the hospital. ?If you have a vascular access, such as a central line, to provide short-term or long-term access to your veins. ?If you have a catheter to drain urine from your bladder. ?If you are  on a ventilator. A ventilator is a machine that helps you breathe by moving air in and out of your lungs. ?After surgery. ?What are the risks? ?Risks of using CHG include: ?A skin reaction. ?Hearing loss, if CHG gets in your ears and you have a perforated eardrum. ?Eye injury, if CHG gets in your eyes and is not rinsed out. ?The CHG product catching fire. ?Make sure that you avoid smoking and flames after applying CHG to your skin. ?Do not use CHG: ?If you have a chlorhexidine allergy or have previously reacted to chlorhexidine. ?On babies younger than 58 months of age. ?How to use CHG solution ?Use CHG only as told by your health care provider, and follow the instructions on the label. ?Use the full amount of CHG as directed. Usually, this is one bottle. ?During a shower ?Follow these steps when using CHG solution during a shower (unless your health care provider gives you different instructions): ?Start the shower. ?Use your normal soap and shampoo to wash your face and hair. ?Turn off the shower or move out of the shower stream. ?Pour the CHG onto a clean washcloth. Do not  use any type of brush or rough-edged sponge. ?Starting at your neck, lather your body down to your toes. Make sure you follow these instructions: ?If you will be having surgery, pay special attention to the part of your body where you will be having surgery. Scrub this area for at least 1 minute. ?Do not use CHG on your head or face. If the solution gets into your ears or eyes, rinse them well with water. ?Avoid your genital area. ?Avoid any areas of skin that have broken skin, cuts, or scrapes. ?Scrub your back and under your arms. Make sure to wash skin folds. ?Let the lather sit on your skin for 1-2 minutes or as long as told by your health care provider. ?Thoroughly rinse your entire body in the shower. Make sure that all body creases and crevices are rinsed well. ?Dry off with a clean towel. Do not put any substances on your body  afterward--such as powder, lotion, or perfume--unless you are told to do so by your health care provider. Only use lotions that are recommended by the manufacturer. ?Put on clean clothes or pajamas. ?If it is the night before your surgery, sleep in clean sheets. ? ?During a sponge bath ?Follow these steps when using CHG solution during a sponge bath (unless your health care provider gives you different instructions): ?Use your normal soap and shampoo to wash your face and hair. ?Pour the CHG onto a clean washcloth. ?Starting at your neck, lather your body down to your toes. Make sure you follow these instructions: ?If you will be having surgery, pay special attention to the part of your body where you will be having surgery. Scrub this area for at least 1 minute. ?Do not use CHG on your head or face. If the solution gets into your ears or eyes, rinse them well with water. ?Avoid your genital area. ?Avoid any areas of skin that have broken skin, cuts, or scrapes. ?Scrub your back and under your arms. Make sure to wash skin folds. ?Let the lather sit on your skin for 1-2 minutes or as long as told by your health care provider. ?Using a different clean, wet washcloth, thoroughly rinse your entire body. Make sure that all body creases and crevices are rinsed well. ?Dry off with a clean towel. Do not put any substances on your body afterward--such as powder, lotion, or perfume--unless you are told to do so by your health care provider. Only use lotions that are recommended by the manufacturer. ?Put on clean clothes or pajamas. ?If it is the night before your surgery, sleep in clean sheets. ?How to use CHG prepackaged cloths ?Only use CHG cloths as told by your health care provider, and follow the instructions on the label. ?Use the CHG cloth on clean, dry skin. ?Do not use the CHG cloth on your head or face unless your health care provider tells you to. ?When washing with the CHG cloth: ?Avoid your genital area. ?Avoid  any areas of skin that have broken skin, cuts, or scrapes. ?Before surgery ?Follow these steps when using a CHG cloth to clean before surgery (unless your health care provider gives you different instructions): ?Using the CHG cloth, vigorously scrub the part of your body where you will be having surgery. Scrub using a back-and-forth motion for 3 minutes. The area on your body should be completely wet with CHG when you are done scrubbing. ?Do not rinse. Discard the cloth and let the area air-dry. Do not put any substances  on the area afterward, such as powder, lotion, or perfume. ?Put on clean clothes or pajamas. ?If it is the night before your surgery, sleep in clean sheets. ? ?For general bathing ?Follow these steps when using CHG cloths for general bathing (unless your health care provider gives you different instructions). ?Use a separate CHG cloth for each area of your body. Make sure you wash between any folds of skin and between your fingers and toes. Wash your body in the following order, switching to a new cloth after each step: ?The front of your neck, shoulders, and chest. ?Both of your arms, under your arms, and your hands. ?Your stomach and groin area, avoiding the genitals. ?Your right leg and foot. ?Your left leg and foot. ?The back of your neck, your back, and your buttocks. ?Do not rinse. Discard the cloth and let the area air-dry. Do not put any substances on your body afterward--such as powder, lotion, or perfume--unless you are told to do so by your health care provider. Only use lotions that are recommended by the manufacturer. ?Put on clean clothes or pajamas. ?Contact a health care provider if: ?Your skin gets irritated after scrubbing. ?You have questions about using your solution or cloth. ?You swallow any chlorhexidine. Call your local poison control center (1-367 646 7238 in the [place] ?Get help right away if: ?Your eyes itch badly, or they become very red or swollen. ?Your skin itches  badly and is red or swollen. ?Your hearing changes. ?You have trouble seeing. ?You have swelling or tingling in your mouth or throat. ?You have trouble breathing. ?These symptoms may represent a serious problem that

## 2021-07-24 ENCOUNTER — Encounter (HOSPITAL_COMMUNITY)
Admission: RE | Admit: 2021-07-24 | Discharge: 2021-07-24 | Disposition: A | Discharge status: Home / Self Care | Payer: Medicaid Other | Source: Ambulatory Visit | Attending: Urology | Admitting: Urology

## 2021-07-24 VITALS — BP 142/92 | HR 93 | Temp 98.2°F | Resp 18 | Ht 65.0 in | Wt 281.0 lb

## 2021-07-24 DIAGNOSIS — Z01812 Encounter for preprocedural laboratory examination: Secondary | ICD-10-CM | POA: Insufficient documentation

## 2021-07-24 DIAGNOSIS — Z01818 Encounter for other preprocedural examination: Secondary | ICD-10-CM

## 2021-07-24 LAB — PREGNANCY, URINE: Preg Test, Ur: NEGATIVE

## 2021-07-25 ENCOUNTER — Ambulatory Visit: Payer: Self-pay

## 2021-07-25 ENCOUNTER — Other Ambulatory Visit: Payer: Self-pay | Admitting: *Deleted

## 2021-07-25 NOTE — Patient Outreach (Signed)
Care Coordination ? ?07/25/2021 ? ?Kaka ?1981-01-19 ?[id number] ? ?Successful telephone outreach to patient with purpose of completing initial assessment for the Medicaid Managed - High Risk Care Management /Care Coordination Program. This RNCM explained the purpose of the program and the role of the Managed Medicaid team members. Patient is not sure if she wants to participate at this time. Patient states she is in significant pain due to a kidney stone and ureteral stent placement while she was hospitalized earlier this month. Says she is taking oxycodone alternating with Tylenol to treat the pain. States pain level is reduced to 6 from 10 with the medications. She says she is scheduled for surgery on 3/30 to have another stent placed and removal of the stone. She is requesting time to think about whether she will enroll in the program and asks that the initial telephone assessment be rescheduled to next week.  ? ?Plan: ?With patient's agreement, initial telephone appointment rescheduled to 08/01/21 at 12 noon. ? ?Kelli Churn RN, CCM, CDCES ?[facility] ?Care Management Coordinator - Managed Medicaid High Risk ?[phone]  ?

## 2021-07-27 ENCOUNTER — Encounter (HOSPITAL_COMMUNITY)
Admission: RE | Disposition: A | Discharge status: Home / Self Care | Payer: Self-pay | Source: Home / Self Care | Attending: Urology

## 2021-07-27 ENCOUNTER — Ambulatory Visit (HOSPITAL_COMMUNITY)
Admission: RE | Admit: 2021-07-27 | Discharge: 2021-07-27 | Disposition: A | Discharge status: Home / Self Care | Payer: Medicaid Other | Attending: Urology | Admitting: Urology

## 2021-07-27 ENCOUNTER — Encounter (HOSPITAL_COMMUNITY): Payer: Self-pay | Admitting: Urology

## 2021-07-27 ENCOUNTER — Ambulatory Visit (HOSPITAL_COMMUNITY): Payer: Medicaid Other

## 2021-07-27 ENCOUNTER — Ambulatory Visit (HOSPITAL_COMMUNITY): Payer: Medicaid Other | Admitting: Anesthesiology

## 2021-07-27 ENCOUNTER — Ambulatory Visit (HOSPITAL_BASED_OUTPATIENT_CLINIC_OR_DEPARTMENT_OTHER): Payer: Medicaid Other | Admitting: Anesthesiology

## 2021-07-27 ENCOUNTER — Other Ambulatory Visit: Payer: Self-pay

## 2021-07-27 DIAGNOSIS — D649 Anemia, unspecified: Secondary | ICD-10-CM | POA: Diagnosis not present

## 2021-07-27 DIAGNOSIS — F419 Anxiety disorder, unspecified: Secondary | ICD-10-CM | POA: Insufficient documentation

## 2021-07-27 DIAGNOSIS — Z79899 Other long term (current) drug therapy: Secondary | ICD-10-CM | POA: Insufficient documentation

## 2021-07-27 DIAGNOSIS — I10 Essential (primary) hypertension: Secondary | ICD-10-CM | POA: Insufficient documentation

## 2021-07-27 DIAGNOSIS — N201 Calculus of ureter: Secondary | ICD-10-CM

## 2021-07-27 DIAGNOSIS — Z6841 Body Mass Index (BMI) 40.0 and over, adult: Secondary | ICD-10-CM | POA: Insufficient documentation

## 2021-07-27 DIAGNOSIS — J45909 Unspecified asthma, uncomplicated: Secondary | ICD-10-CM | POA: Diagnosis not present

## 2021-07-27 DIAGNOSIS — J4530 Mild persistent asthma, uncomplicated: Secondary | ICD-10-CM | POA: Diagnosis not present

## 2021-07-27 DIAGNOSIS — K219 Gastro-esophageal reflux disease without esophagitis: Secondary | ICD-10-CM | POA: Diagnosis not present

## 2021-07-27 DIAGNOSIS — N200 Calculus of kidney: Secondary | ICD-10-CM

## 2021-07-27 HISTORY — PX: CYSTOSCOPY WITH RETROGRADE PYELOGRAM, URETEROSCOPY AND STENT PLACEMENT: SHX5789

## 2021-07-27 SURGERY — CYSTOURETEROSCOPY, WITH RETROGRADE PYELOGRAM AND STENT INSERTION
Anesthesia: General | Site: Ureter | Laterality: Left

## 2021-07-27 MED ORDER — FENTANYL CITRATE (PF) 100 MCG/2ML IJ SOLN
INTRAMUSCULAR | Status: DC | PRN
  Administered 2021-07-27 (×2): 50 ug via INTRAVENOUS

## 2021-07-27 MED ORDER — SODIUM CHLORIDE 0.9 % IV SOLN
INTRAVENOUS | Status: AC
  Filled 2021-07-27: qty 20

## 2021-07-27 MED ORDER — OXYCODONE HCL 5 MG PO TABS: 5.0000 mg | ORAL_TABLET | Freq: Four times a day (QID) | ORAL | 0 refills | Status: DC | PRN

## 2021-07-27 MED ORDER — FENTANYL CITRATE (PF) 100 MCG/2ML IJ SOLN
INTRAMUSCULAR | Status: AC
  Filled 2021-07-27: qty 2

## 2021-07-27 MED ORDER — LACTATED RINGERS IV SOLN: INTRAVENOUS | Status: DC

## 2021-07-27 MED ORDER — DEXMEDETOMIDINE (PRECEDEX) IN NS 20 MCG/5ML (4 MCG/ML) IV SYRINGE
PREFILLED_SYRINGE | INTRAVENOUS | Status: DC | PRN
  Administered 2021-07-27: 10 ug via INTRAVENOUS

## 2021-07-27 MED ORDER — DIPHENHYDRAMINE HCL 50 MG/ML IJ SOLN
INTRAMUSCULAR | Status: DC | PRN
  Administered 2021-07-27: 12.5 mg via INTRAVENOUS

## 2021-07-27 MED ORDER — SODIUM CHLORIDE 0.9 % IV SOLN: INTRAVENOUS | Status: DC

## 2021-07-27 MED ORDER — DEXAMETHASONE SODIUM PHOSPHATE 4 MG/ML IJ SOLN
INTRAMUSCULAR | Status: DC | PRN
  Administered 2021-07-27: 10 mg via INTRAVENOUS

## 2021-07-27 MED ORDER — MIDAZOLAM HCL 5 MG/5ML IJ SOLN
INTRAMUSCULAR | Status: DC | PRN
Start: 2021-07-27 — End: 2021-07-27
  Administered 2021-07-27: 2 mg via INTRAVENOUS

## 2021-07-27 MED ORDER — MIDAZOLAM HCL 2 MG/2ML IJ SOLN
INTRAMUSCULAR | Status: AC
  Filled 2021-07-27: qty 2

## 2021-07-27 MED ORDER — SUGAMMADEX SODIUM 500 MG/5ML IV SOLN
INTRAVENOUS | Status: AC
  Filled 2021-07-27: qty 5

## 2021-07-27 MED ORDER — WATER FOR IRRIGATION, STERILE IR SOLN
Status: DC | PRN
  Administered 2021-07-27: 500 mL

## 2021-07-27 MED ORDER — ORAL CARE MOUTH RINSE: 15.0000 mL | Freq: Once | OROMUCOSAL | Status: AC

## 2021-07-27 MED ORDER — DEXMEDETOMIDINE (PRECEDEX) IN NS 20 MCG/5ML (4 MCG/ML) IV SYRINGE
PREFILLED_SYRINGE | INTRAVENOUS | Status: AC
  Filled 2021-07-27: qty 10

## 2021-07-27 MED ORDER — ONDANSETRON HCL 4 MG/2ML IJ SOLN: 4.0000 mg | Freq: Once | INTRAMUSCULAR | Status: DC | PRN

## 2021-07-27 MED ORDER — SODIUM CHLORIDE 0.9 % IV SOLN
2.0000 g | INTRAVENOUS | Status: AC
  Administered 2021-07-27: 2 g via INTRAVENOUS

## 2021-07-27 MED ORDER — DIPHENHYDRAMINE HCL 50 MG/ML IJ SOLN
INTRAMUSCULAR | Status: AC
Start: 2021-07-27 — End: ?
  Filled 2021-07-27: qty 2

## 2021-07-27 MED ORDER — ONDANSETRON HCL 4 MG/2ML IJ SOLN
INTRAMUSCULAR | Status: DC | PRN
  Administered 2021-07-27: 4 mg via INTRAVENOUS

## 2021-07-27 MED ORDER — DIATRIZOATE MEGLUMINE 30 % UR SOLN
URETHRAL | Status: AC
  Filled 2021-07-27: qty 100

## 2021-07-27 MED ORDER — LIDOCAINE HCL (CARDIAC) PF 100 MG/5ML IV SOSY
PREFILLED_SYRINGE | INTRAVENOUS | Status: DC | PRN
  Administered 2021-07-27: 60 mg via INTRAVENOUS

## 2021-07-27 MED ORDER — METOCLOPRAMIDE HCL 5 MG/ML IJ SOLN
INTRAMUSCULAR | Status: AC
Start: 2021-07-27 — End: ?
  Filled 2021-07-27: qty 2

## 2021-07-27 MED ORDER — PROPOFOL 10 MG/ML IV BOLUS
INTRAVENOUS | Status: AC
  Filled 2021-07-27: qty 20

## 2021-07-27 MED ORDER — CHLORHEXIDINE GLUCONATE 0.12 % MT SOLN
15.0000 mL | Freq: Once | OROMUCOSAL | Status: AC
  Administered 2021-07-27: 15 mL via OROMUCOSAL

## 2021-07-27 MED ORDER — FENTANYL CITRATE PF 50 MCG/ML IJ SOSY
25.0000 ug | PREFILLED_SYRINGE | INTRAMUSCULAR | Status: DC | PRN
  Administered 2021-07-27: 50 ug via INTRAVENOUS

## 2021-07-27 MED ORDER — ONDANSETRON 8 MG PO TBDP: 8.0000 mg | ORAL_TABLET | Freq: Three times a day (TID) | ORAL | 0 refills | Status: DC | PRN

## 2021-07-27 MED ORDER — DIATRIZOATE MEGLUMINE 30 % UR SOLN
URETHRAL | Status: DC | PRN
  Administered 2021-07-27: 8 mL via URETHRAL

## 2021-07-27 MED ORDER — FENTANYL CITRATE PF 50 MCG/ML IJ SOSY
PREFILLED_SYRINGE | INTRAMUSCULAR | Status: AC
  Filled 2021-07-27: qty 1

## 2021-07-27 MED ORDER — ROCURONIUM BROMIDE 10 MG/ML (PF) SYRINGE
PREFILLED_SYRINGE | INTRAVENOUS | Status: AC
  Filled 2021-07-27: qty 10

## 2021-07-27 MED ORDER — PROPOFOL 10 MG/ML IV BOLUS
INTRAVENOUS | Status: DC | PRN
  Administered 2021-07-27: 150 mg via INTRAVENOUS
  Administered 2021-07-27: 50 mg via INTRAVENOUS

## 2021-07-27 MED ORDER — SODIUM CHLORIDE 0.9 % IR SOLN
Status: DC | PRN
  Administered 2021-07-27 (×2): 3000 mL

## 2021-07-27 MED ORDER — LIDOCAINE HCL (PF) 2 % IJ SOLN
INTRAMUSCULAR | Status: AC
Start: 2021-07-27 — End: ?
  Filled 2021-07-27: qty 20

## 2021-07-27 SURGICAL SUPPLY — 21 items
BAG DRAIN URO TABLE W/ADPT NS (BAG) ×3 IMPLANT
BAG DRN 8 ADPR NS SKTRN CSTL (BAG) ×2
BAG HAMPER (MISCELLANEOUS) ×3 IMPLANT
CATH INTERMIT  6FR 70CM (CATHETERS) ×3 IMPLANT
CLOTH BEACON ORANGE TIMEOUT ST (SAFETY) ×3 IMPLANT
DECANTER SPIKE VIAL GLASS SM (MISCELLANEOUS) ×3 IMPLANT
EXTRACTOR STONE NITINOL NGAGE (UROLOGICAL SUPPLIES) ×2 IMPLANT
GLOVE SURG POLYISO LF SZ8 (GLOVE) ×3 IMPLANT
GLOVE SURG UNDER POLY LF SZ7 (GLOVE) ×8 IMPLANT
GOWN STRL REUS W/TWL LRG LVL3 (GOWN DISPOSABLE) ×5 IMPLANT
GOWN STRL REUS W/TWL XL LVL3 (GOWN DISPOSABLE) ×3 IMPLANT
GUIDEWIRE STR DUAL SENSOR (WIRE) ×3 IMPLANT
GUIDEWIRE STR ZIPWIRE 035X150 (MISCELLANEOUS) ×3 IMPLANT
IV NS IRRIG 3000ML ARTHROMATIC (IV SOLUTION) ×6 IMPLANT
KIT TURNOVER CYSTO (KITS) ×3 IMPLANT
MANIFOLD NEPTUNE II (INSTRUMENTS) ×3 IMPLANT
PACK CYSTO (CUSTOM PROCEDURE TRAY) ×3 IMPLANT
PAD ARMBOARD 7.5X6 YLW CONV (MISCELLANEOUS) ×3 IMPLANT
SYR 10ML LL (SYRINGE) ×3 IMPLANT
SYR CONTROL 10ML LL (SYRINGE) ×3 IMPLANT
WATER STERILE IRR 500ML POUR (IV SOLUTION) ×3 IMPLANT

## 2021-07-27 NOTE — Anesthesia Procedure Notes (Signed)
Procedure Name: LMA Insertion ?Date/Time: 07/27/2021 12:00 PM ?Performed by: Minerva Ends, CRNA ?Pre-anesthesia Checklist: Patient identified, Emergency Drugs available, Suction available and Patient being monitored ?Patient Re-evaluated:Patient Re-evaluated prior to induction ?Oxygen Delivery Method: Circle system utilized ?Preoxygenation: Pre-oxygenation with 100% oxygen ?Induction Type: IV induction ?LMA Size: 4.0 ?Tube type: Oral ?Number of attempts: 1 ?Placement Confirmation: positive ETCO2 and breath sounds checked- equal and bilateral ?Tube secured with: Tape ?Dental Injury: Teeth and Oropharynx as per pre-operative assessment  ? ? ? ? ?

## 2021-07-27 NOTE — Op Note (Signed)
.  Preoperative diagnosis: Left ureteral stone ? ?Postoperative diagnosis: Same ? ?Procedure: 1 cystoscopy ?2. Left retrograde pyelography ?3.  Intraoperative fluoroscopy, under one hour, with interpretation ?4.  Left ureteroscopic stone manipulation with basket extraction ?5.  Left 6 x 26 JJ stent removal ? ?Attending: Rosie Fate ? ?Anesthesia: General ? ?Estimated blood loss: None ? ?Drains: none ? ?Specimens: stone for analysis ? ?Antibiotics: rocephin ? ?Findings: left mid ureteral stone. No hydronephrosis. No masses/lesions in the bladder. Ureteral orifices in normal anatomic location. ? ?Indications: Patient is a 41 year old female with a history of left ureteral stone who underwent stent placement for sepsis 1 month ago.  After discussing treatment options, she decided proceed with left ureteroscopic stone manipulation. ? ?Procedure in detail: The patient was brought to the operating room and a brief timeout was done to ensure correct patient, correct procedure, correct site.  General anesthesia was administered patient was placed in dorsal lithotomy position.  Her genitalia was then prepped and draped in usual sterile fashion.  A rigid 58 French cystoscope was passed in the urethra and the bladder.  Bladder was inspected free masses or lesions.  the ureteral orifices were in the normal orthotopic locations.  a 6 french ureteral catheter was then instilled into the left ureteral orifice.  a gentle retrograde was obtained and findings noted above. Using a grasper the left ureteral stent was brought to the urethral meatus. we then placed a zip wire through the ureteral stent and advanced up to the renal pelvis.  We then removed the stent. we then removed the cystoscope and cannulated the left ureteral orifice with a semirigid ureteroscope.  We encountered a stone in the mid ureter which was removed with an NGage basket. Repeat ureteroscopy to the left renal pelvis yielded no additional calculi. We then  elected to not place a stent since this was an uncomplicated ureteroscopy.  the bladder was then drained and this concluded the procedure which was well tolerated by patient. ? ?Complications: None ? ?Condition: Stable, extubated, transferred to PACU ? ?Plan: Patient is to be discharged home as to follow-up in one week  ?

## 2021-07-27 NOTE — Anesthesia Postprocedure Evaluation (Signed)
Anesthesia Post Note ? ?Patient: [name] ? ?Procedure(s) Performed: CYSTOSCOPY WITH RETROGRADE PYELOGRAM, URETEROSCOPY, STONE EXTRACTION WITH BASKET (Left: Ureter) ? ?Patient location during evaluation: Phase II ?Anesthesia Type: General ?Level of consciousness: awake ?Pain management: pain level controlled ?Vital Signs Assessment: post-procedure vital signs reviewed and stable ?Respiratory status: spontaneous breathing and respiratory function stable ?Cardiovascular status: blood pressure returned to baseline and stable ?Postop Assessment: no headache and no apparent nausea or vomiting ?Anesthetic complications: no ?Comments: Late entry ? ? ?No notable events documented. ? ? ?Last Vitals:  ?Vitals:  ? 07/27/21 1300 07/27/21 1309  ?BP: (!) 132/94 135/89  ?Pulse: 84 89  ?Resp: 16 20  ?Temp:  36.9 ?C  ?SpO2: 100% 97%  ?  ?Last Pain:  ?Vitals:  ? 07/27/21 1309  ?TempSrc: Oral  ?PainSc: 4   ? ? ?  ?  ?  ?  ?  ?  ? ?Louann Sjogren ? ? ? ? ?

## 2021-07-27 NOTE — Anesthesia Preprocedure Evaluation (Signed)
Anesthesia Evaluation  ?Patient identified by MRN, date of birth, ID band ?Patient awake ? ? ? ?Reviewed: ?Allergy & Precautions, NPO status , Patient's Chart, lab work & pertinent test results ? ?Airway ?Mallampati: II ? ?TM Distance: >3 FB ?Neck ROM: Full ? ? ? Dental ? ?(+) Dental Advisory Given, Teeth Intact ?  ?Pulmonary ?asthma , Recent URI ,  ?  ?Pulmonary exam normal ?breath sounds clear to auscultation ? ? ? ? ? ? Cardiovascular ?Exercise Tolerance: Good ?hypertension, Pt. on medications ?Normal cardiovascular exam ?Rhythm:Regular Rate:Normal ? ? ?  ?Neuro/Psych ? Headaches, Anxiety  Neuromuscular disease   ? GI/Hepatic ?Neg liver ROS, GERD  Medicated,  ?Endo/Other  ?Morbid obesity ? Renal/GU ?Renal disease  ? ?  ?Musculoskeletal ? ? Abdominal ?  ?Peds ? Hematology ? ?(+) Blood dyscrasia, anemia ,   ?Anesthesia Other Findings ? ? Reproductive/Obstetrics ? ?  ? ? ? ? ? ? ? ? ? ? ? ? ? ?  ?  ? ? ? ? ? ? ? ? ?Anesthesia Physical ? ?Anesthesia Plan ? ?ASA: 3 ? ?Anesthesia Plan: General  ? ?Post-op Pain Management:   ? ?Induction: Intravenous ? ?PONV Risk Score and Plan: 4 or greater and Ondansetron ? ?Airway Management Planned: LMA ? ?Additional Equipment:  ? ?Intra-op Plan:  ? ?Post-operative Plan: Extubation in OR ? ?Informed Consent: I have reviewed the patients History and Physical, chart, labs and discussed the procedure including the risks, benefits and alternatives for the proposed anesthesia with the patient or authorized representative who has indicated his/her understanding and acceptance.  ? ? ? ?Dental advisory given ? ?Plan Discussed with: CRNA and Surgeon ? ?Anesthesia Plan Comments:   ? ? ? ? ? ? ?Anesthesia Quick Evaluation ? ?

## 2021-07-27 NOTE — Transfer of Care (Signed)
Immediate Anesthesia Transfer of Care Note ? ?Patient: [name] ? ?Procedure(s) Performed: CYSTOSCOPY WITH RETROGRADE PYELOGRAM, URETEROSCOPY, STONE EXTRACTION WITH BASKET (Left: Ureter) ? ?Patient Location: PACU ? ?Anesthesia Type:General ? ?Level of Consciousness: awake, alert  and oriented ? ?Airway & Oxygen Therapy: Patient Spontanous Breathing ? ?Post-op Assessment: Report given to RN and Post -op Vital signs reviewed and stable ? ?Post vital signs: Reviewed and stable ? ?Last Vitals:  ?Vitals Value Taken Time  ?BP 122/88 07/27/21 1233  ?Temp    ?Pulse 85 07/27/21 1234  ?Resp 20 07/27/21 1234  ?SpO2 98 % 07/27/21 1234  ?Vitals shown include unvalidated device data. ? ?Last Pain:  ?Vitals:  ? 07/27/21 1109  ?TempSrc: Oral  ?PainSc: 7   ?   ? ?Patients Stated Pain Goal: 5 (07/27/21 1109) ? ?Complications: No notable events documented. ?

## 2021-07-27 NOTE — Interval H&P Note (Signed)
History and Physical Interval Note: ? ?07/27/2021 ?10:49 AM ? ?[name]  has presented today for surgery, with the diagnosis of Left Nephrolithiasis.  The various methods of treatment have been discussed with the patient and family. After consideration of risks, benefits and other options for treatment, the patient has consented to  Procedure(s): ?CYSTOSCOPY WITH RETROGRADE PYELOGRAM, URETEROSCOPY AND STENT PLACEMENT (Left) ?HOLMIUM LASER APPLICATION (Left) as a surgical intervention.  The patient's history has been reviewed, patient examined, no change in status, stable for surgery.  I have reviewed the patient's chart and labs.  Questions were answered to the patient's satisfaction.   ? ? ?Nicolette Bang ? ? ?

## 2021-07-31 ENCOUNTER — Encounter (HOSPITAL_COMMUNITY): Payer: Self-pay | Admitting: Urology

## 2021-08-01 ENCOUNTER — Other Ambulatory Visit: Payer: Self-pay | Admitting: Internal Medicine

## 2021-08-01 ENCOUNTER — Other Ambulatory Visit: Payer: Self-pay | Admitting: *Deleted

## 2021-08-01 DIAGNOSIS — I10 Essential (primary) hypertension: Secondary | ICD-10-CM

## 2021-08-01 DIAGNOSIS — N200 Calculus of kidney: Secondary | ICD-10-CM

## 2021-08-01 LAB — CALCULI, WITH PHOTOGRAPH (CLINICAL LAB)
Calcium Oxalate Dihydrate: 20 %
Calcium Oxalate Monohydrate: 70 %
Hydroxyapatite: 10 %
Weight Calculi: 78 mg

## 2021-08-01 MED ORDER — AMLODIPINE BESYLATE 5 MG PO TABS: 5.0000 mg | ORAL_TABLET | Freq: Every day | ORAL | 0 refills | Status: DC

## 2021-08-01 MED ORDER — BLOOD PRESSURE CUFF MISC: 0 refills | Status: AC

## 2021-08-01 NOTE — Patient Instructions (Addendum)
Visit Information ? ?[name] was given information about Medicaid Managed Care team care coordination services as a part of their Healthy [facility] Medicaid benefit. [name] verbally consented to engagement with the [facility] Managed Care team.  ? ?If you are experiencing a medical emergency, please call 911 or report to your local emergency department or urgent care.  ? ?If you have a non-emergency medical problem during routine business hours, please contact your provider's office and ask to speak with a nurse.  ? ?For questions related to your Healthy [facility] health plan, please call: [phone] or visit the homepage here: [email or web address] ? ?If you would like to schedule transportation through your Healthy [facility] plan, please call the following number at least 2 days in advance of your appointment: [phone] ? For information about your ride after you set it up, call Ride Assist at [phone]. Use this number to activate a Will Call pickup, or if your transportation is late for a scheduled pickup. Use this number, too, if you need to make a change or cancel a previously scheduled reservation. ? If you need transportation services right away, call [phone]. The after-hours call center is staffed 24 hours to handle ride assistance and urgent reservation requests (including discharges) 365 days a year. Urgent trips include sick visits, hospital discharge requests and life-sustaining treatment. ? ?Call the [place] at [phone], at any time, 24 hours a day, 7 days a week. If you are in danger or need immediate medical attention call 911. ? ?If you would like help to quit smoking, call 1-800-QUIT-NOW (334)165-7589) OR Espa?ol: 1-855-D?jelo-Ya 985-279-0329) o para m?s informaci?n haga clic aqu? or Text READY to 200-400 to register via text ? ?[name] - following are the goals we discussed in your  visit today:  ? Goals Addressed   ?Take medications as prescribed   ?Attend all scheduled provider appointments ?Call pharmacy for medication refills 3-7 days in advance of running out of medications ?Perform all self care activities independently  ?Perform IADL's (shopping, preparing meals, housekeeping, managing finances) independently ?Call provider office for new concerns or questions  ?Work with the Education officer, museum to address care coordination needs and will continue to work with the clinical team to address health care and disease management related needs ?check blood pressure weekly ?write blood pressure results in a log or diary ?learn about high blood pressure ?call doctor for signs and symptoms of high blood pressure ?Review all educational materials received in mail and write down any questions or concerns to discuss on next [facility] telephone assessment ? ?Please see education materials related to asthma, HTN, kidney stones, UTIs and insomnia provided as print materials.  ? ?The patient verbalized understanding of instructions, educational materials, and care plan provided today and agreed to receive a mailed copy of patient instructions, educational materials, and care plan.  ? ?The Managed Medicaid care management team will reach out to the patient again over the next 30 days.  ? ?Kelli Churn RN, CCM, CDCES ?[facility] ?Care Management Coordinator - Managed Medicaid High Risk ?[phone]  ? ?Following is a copy of your plan of care:  ?Care Plan : ? [place]  ?Updates made by Barrington Ellison, RN since 08/01/2021 12:00 AM  ?  ? ?Problem: Knowledge Deficit and Care Coordination Needs Related to Management of  Asthma, HTN recurrent UTIs and kidney stones   ?Priority: High  ?  ? ?Long-Range Goal: Psychologist, counselling  of Plan Of Care to Address Care Coordination Needs and Knowledge Deficits for Management of asthma, HTN, recurrent UTIs and kidney stones   ?Start Date:  08/01/2021  ?Expected End Date: 08/02/2022  ?Priority: High  ?Note:   ?Current Barriers:  ?Knowledge Deficits related to plan of care for management of HTN and asthma and recurrent UTIs with kidney stones  ?Care Coordination needs related to Financial constraints related to utility bills  ?Film/video editor.  ?Insomnia ?RNCM Clinical Goal(s):  ?Patient will verbalize understanding of plan for management of HTN, Asthma, and recurrent UTIs with kidney stones as evidenced by decreased incidences/reports of UTIs, no hospital admissions or ED visits related to complications of HTN, asthma or UTI/Kidney stones ?take all medications exactly as prescribed and will call provider for medication related questions as evidenced by patient reports of no missed medications    ?attend all scheduled medical appointments: with medical providers as evidenced by absence of no shows for medical appointments        ?demonstrate improved adherence to prescribed treatment plan for HTN, Asthma, and recurrent UTIs with kidney stones as evidenced by decreased incidences/reports of UTIs, no hospital admissions or ED related to complications of HTN, Asthma, UTIs or kidney stones ?continue to work with RN Care Manager and/or Social Worker to address care management and care coordination needs related to HTN, Asthma, and recurrent UTIs and kidney stones as evidenced by adherence to CM Team Scheduled appointments     ?work with community resource care guide to address needs related to Financial constraints related to paying utility bills as evidenced by patient and/or community resource care guide support    through collaboration with Consulting civil engineer, provider, and care team.  ? ?Interventions: ?Inter-disciplinary care team collaboration (see longitudinal plan of care) ?Evaluation of current treatment plan related to  self management and patient's adherence to plan as established by provider ?Mailed written educational information to patient's  home address related in insomnia ? ? 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, reports having difficulty managing household bills, say her Mom provides babysitting services for her 56 month old daughter when she is able to work  ?Collaborated with BSW to initiate plan of care to address needs related to Financial constraints related to payment of household bills  in patient with HTN, Asthma, and recurrent UTIs with kidney stones ?Collaboration with [name], MD regarding development and update of comprehensive plan of care as evidenced by provider attestation and co-signature ?Inter-disciplinary care team collaboration   ?Scheduled initial telephone assessment with Managed Medicaid BSW to address financial insecurity and complete SDOH screening ?Referral to [facility] care guide for household bills assistance/ resources ? ?Asthma: (Status: New goal.) Long Term Goal - patient states pollen is a trigger, reports last asthma attack was in January and she was able to self treat with rescue inhaler ?Provided patient with basic written and verbal Asthma education on self care/management/and exacerbation prevention; ?Advised patient to track and manage Asthma triggers;  ?Provided instruction about proper use of medications used for management of Asthma including inhalers; ?Advised patient to self assesses Asthma action plan zone and make appointment with provider if in the yellow zone for 48 hours without improvement; ?Reviewed purpose of Advair and prescribed frequency of twice daily not once daily as she  is currently doing ?Mailed written educational information to patient's home address ? ?Recurrent UTIs and Kidney Stones - s/p OP surgery for removal of kidney stone and ureteral stent on  3/30/223 (Status: New goal.) Long Term Goal  ?Evaluation of current treatment plan related to  recurrent UTIs and kidney stones ,  self-management and patient's adherence to plan as established by provider. ?Discussed pla

## 2021-08-02 NOTE — Patient Outreach (Signed)
?Medicaid Managed Care   ?Nurse Care Manager Note ? ?08/01/2021 ?Name:  [name] MRN:  [id number] DOB:  Apr 06, 1981 ? ?[name] is an 41 y.o. year old female who is a primary patient of [name], MD.  [facility] Managed Care Coordination team was consulted for assistance with:    ?HTN ?Asthma ?Recurrent UTIs with kidney stones ? ?Ms. Bench was given information about Medicaid Managed Care Coordination team services today. [name] Patient agreed to services and verbal consent obtained. ? ?Engaged with patient by telephone for initial visit in response to provider referral for case management and/or care coordination services.  ? ?Assessments/Interventions:  Review of past medical history, allergies, medications, health status, including review of consultants reports, laboratory and other test data, was performed as part of comprehensive evaluation and provision of chronic care management services. ? ?SDOH (Social Determinants of Health) assessments and interventions performed: ?SDOH Interventions   ? ?Flowsheet Row Most Recent Value  ?SDOH Interventions   ?Food Insecurity Interventions Intervention Not Indicated  ?Financial Strain Interventions Other (Comment)  [referral to MM  BSW and community care guide]  ?Housing Interventions Intervention Not Indicated  ?Transportation Interventions Intervention Not Indicated  [reviewed transportation benefit of managed Medicaid health plan]  ? ?  ? ? ?Care Plan ? ?Allergies  ?Allergen Reactions  ? Banana Rash  ? Pineapple Rash  ? ? ?Medications Reviewed Today   ? ? Reviewed by Barrington Ellison, RN (Registered Nurse) on 08/01/21 at 1502  Med List Status: <None>  ? ?Medication Order Taking? Sig Documenting Provider Last Dose Status Informant  ?acetaminophen (TYLENOL) 500 MG tablet [id number] Yes Take 1,000 mg by mouth every 6 (six) hours as needed for mild pain. [provider] Taking Active Self  ?albuterol (PROVENTIL  HFA) 108 (90 Base) MCG/ACT inhaler [id number] Yes Inhale 2 puffs into the lungs every 6 (six) hours as needed for wheezing (cough). [name], MD Taking Active Self  ?amLODipine (NORVASC) 5 MG tablet [id number] Yes Take 1 tablet (5 mg total) by mouth daily. [name], MD Taking Active   ?EPINEPHrine 0.3 mg/0.3 mL IJ SOAJ injection [id number] Yes Inject 0.3 mg into the muscle as needed for anaphylaxis. Althea Charon, FNP Taking Active Self  ?         ?Med Note [name], Macala Baldonado S   Tue Aug 01, 2021  3:01 PM) Uses prn for anaphylaxis to bananas and pineapple  ?fluticasone (FLONASE) 50 MCG/ACT nasal spray [id number] Yes Place 2 sprays into both nostrils daily as needed for allergies or rhinitis. [provider] Taking Active Self  ?fluticasone-salmeterol (ADVAIR HFA) 115-21 MCG/ACT inhaler [id number] Yes Inhale 2 puffs into the lungs 2 (two) times daily.  ?Patient taking differently: Inhale 2 puffs into the lungs daily.  ? [name], MD Taking Active Self  ?         ?Med Note [name], [name]   Tue Aug 01, 2021  3:02 PM) Teaching done with patient advising her to administer twice daily not once daily as she is currently doing  ?hydrochlorothiazide (MICROZIDE) 12.5 MG capsule [id number] Yes Take 1 capsule (12.5 mg total) by mouth daily. TAKE (1) CAPSULE BY MOUTH ONCE EVERY DAY. ?Strength: 12.5 mg [name], MD Taking Active Self  ?Olopatadine HCl 0.2 % SOLN [id number] Yes Use 1 drop in each eye once a day as needed for itchy watery eyes  ?Patient taking differently: Place 1 drop into both eyes every 6 (six)  hours as needed (itchy watery eyes).  ? Althea Charon, FNP Taking Active Self  ?ondansetron (ZOFRAN-ODT) 8 MG disintegrating tablet [id number] No Take 1 tablet (8 mg total) by mouth every 8 (eight) hours as needed for nausea or vomiting.  ?Patient not taking: Reported on 08/01/2021  ? McKenzie, [name], MD Not Taking Active   ?oxyCODONE (OXY IR/ROXICODONE) 5 MG immediate release tablet  [id number] Yes Take 1 tablet (5 mg total) by mouth every 6 (six) hours as needed for moderate pain. McKenzie, [name], MD Taking Active   ?potassium chloride SA (KLOR-CON M) 20 MEQ tablet [id number] Yes Take 1 tablet (20 mEq total) by mouth daily. Barton Dubois, MD Taking Active Self  ?rOPINIRole (REQUIP) 1 MG tablet [id number] Yes Take 1 tablet (1 mg total) by mouth at bedtime. Barton Dubois, MD Taking Active Self  ? Patient not taking:   Discontinued 11/12/18 1700   ? ?  ?  ? ?  ? ? ?Patient Active Problem List  ? Diagnosis Date Noted  ? Hospital discharge follow-up 07/14/2021  ? Pyelonephritis 06/28/2021  ? Sepsis ([place]) 06/28/2021  ? Hydronephrosis 06/28/2021  ? Fibroadenoma of breast, left 03/31/2021  ? Pain of left breast 03/31/2021  ? Hyperprolactinemia ([place]) 02/10/2021  ? Restless legs 02/10/2021  ? Elevated prolactin level 02/10/2021  ? Encounter for gynecological examination with Papanicolaou smear of cervix 02/07/2021  ? Nipple discharge 02/07/2021  ? Hypokalemia 09/30/2020  ? Vitamin D deficiency 09/30/2020  ? Menorrhagia with irregular cycle 08/17/2020  ? Chronic right shoulder pain 07/01/2020  ? AKI (acute kidney injury) ([place]) 12/27/2019  ? Mild persistent asthma, uncomplicated 62/95/2841  ? Abnormal uterine bleeding (AUB) 03/12/2017  ? Intramural leiomyoma of uterus 08/24/2015  ? Polyclonal gammopathy 07/26/2013  ? Dysplasia of cervix, unspecified 03/31/2013  ? Hypertension 09/21/2010  ? Morbid obesity ([place]) 11/03/2007  ? ? ? ?Care Plan : ? RN Care Manager Plan Of Care  ?Updates made by Barrington Ellison, RN since 08/02/2021 12:00 AM  ?  ? ?Problem: Knowledge Deficit and Care Coordination Needs Related to Management of  Asthma, HTN recurrent UTIs and kidney stones   ?Priority: High  ?  ? ?Long-Range Goal: Development of Plan Of Care to Address Care Coordination Needs and Knowledge Deficits for Management of asthma, HTN, recurrent UTIs and kidney stones   ?Start Date: 08/01/2021  ?Expected End Date: 08/02/2022   ?Priority: High  ?Note:   ?Current Barriers:  ?Knowledge Deficits related to plan of care for management of HTN and asthma and recurrent UTIs with kidney stones  ?Care Coordination needs related to Financial constraints related to utility bills  ?Film/video editor.  ?Insomnia ?RNCM Clinical Goal(s):  ?Patient will verbalize understanding of plan for management of HTN, Asthma, and recurrent UTIs with kidney stones as evidenced by decreased incidences/reports of UTIs, no hospital admissions or ED visits related to complications of HTN, asthma or UTI/Kidney stones ?take all medications exactly as prescribed and will call provider for medication related questions as evidenced by patient reports of no missed medications    ?attend all scheduled medical appointments: with medical providers as evidenced by absence of no shows for medical appointments        ?demonstrate improved adherence to prescribed treatment plan for HTN, Asthma, and recurrent UTIs with kidney stones as evidenced by decreased incidences/reports of UTIs, no hospital admissions or ED related to complications of HTN, Asthma, UTIs or kidney stones ?continue to work with Consulting civil engineer and/or Social Worker to address care  management and care coordination needs related to HTN, Asthma, and recurrent UTIs and kidney stones as evidenced by adherence to CM Team Scheduled appointments     ?work with community resource care guide to address needs related to Financial constraints related to paying utility bills as evidenced by patient and/or community resource care guide support    through collaboration with Consulting civil engineer, provider, and care team.  ? ?Interventions: ?Inter-disciplinary care team collaboration (see longitudinal plan of care) ?Evaluation of current treatment plan related to  self management and patient's adherence to plan as established by provider ?Mailed written educational information to patient's home address related in  insomnia ? ? 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

## 2021-08-03 ENCOUNTER — Other Ambulatory Visit: Payer: Self-pay

## 2021-08-03 ENCOUNTER — Telehealth: Payer: Self-pay | Admitting: *Deleted

## 2021-08-03 NOTE — Patient Outreach (Signed)
?Medicaid Managed Care ?Social Work Note ? ?08/03/2021 ?Name:  [name] MRN:  [id number] DOB:  08-27-1980 ? ?[name] is an 41 y.o. year old female who is a primary patient of [name], MD.  The Medicaid Managed Care Coordination team was consulted for assistance with:  Community Resources  ? ?Ms. Delmore was given information about Medicaid Managed Care Coordination team services today. [name] Patient agreed to services and verbal consent obtained. ? ?Engaged with patient  for by telephone forinitial visit in response to referral for case management and/or care coordination services.  ? ?Assessments/Interventions:  Review of past medical history, allergies, medications, health status, including review of consultants reports, laboratory and other test data, was performed as part of comprehensive evaluation and provision of chronic care management services. ? ?SDOH: (Social Determinant of Health) assessments and interventions performed: ?/6/23: BSW completed telephone outreach with patient regarding finances. Patient stated she is still working but she is working part time. Patient states she does need assistance with her rent, she is about 800 behind and utilities. Patient states she has to check to see if she has a cut off notice. BSW will email resources to patient at [email or web address]. ? ?Advanced Directives Status:  Not addressed in this encounter. ? ?Care Plan ?                ?Allergies  ?Allergen Reactions  ? Banana Rash  ? Pineapple Rash  ? ? ?Medications Reviewed Today   ? ? Reviewed by Barrington Ellison, RN (Registered Nurse) on 08/01/21 at 1502  Med List Status: <None>  ? ?Medication Order Taking? Sig Documenting Provider Last Dose Status Informant  ?acetaminophen (TYLENOL) 500 MG tablet [id number] Yes Take 1,000 mg by mouth every 6 (six) hours as needed for mild pain. [provider] Taking Active Self  ?albuterol (PROVENTIL HFA) 108 (90 Base)  MCG/ACT inhaler [id number] Yes Inhale 2 puffs into the lungs every 6 (six) hours as needed for wheezing (cough). [name], MD Taking Active Self  ?amLODipine (NORVASC) 5 MG tablet [id number] Yes Take 1 tablet (5 mg total) by mouth daily. [name], MD Taking Active   ?EPINEPHrine 0.3 mg/0.3 mL IJ SOAJ injection [id number] Yes Inject 0.3 mg into the muscle as needed for anaphylaxis. Althea Charon, FNP Taking Active Self  ?         ?Med Note [name], JANET S   Tue Aug 01, 2021  3:01 PM) Uses prn for anaphylaxis to bananas and pineapple  ?fluticasone (FLONASE) 50 MCG/ACT nasal spray [id number] Yes Place 2 sprays into both nostrils daily as needed for allergies or rhinitis. [provider] Taking Active Self  ?fluticasone-salmeterol (ADVAIR HFA) 115-21 MCG/ACT inhaler [id number] Yes Inhale 2 puffs into the lungs 2 (two) times daily.  ?Patient taking differently: Inhale 2 puffs into the lungs daily.  ? [name], MD Taking Active Self  ?         ?Med Note [name], [name]   Tue Aug 01, 2021  3:02 PM) Teaching done with patient advising her to administer twice daily not once daily as she is currently doing  ?hydrochlorothiazide (MICROZIDE) 12.5 MG capsule [id number] Yes Take 1 capsule (12.5 mg total) by mouth daily. TAKE (1) CAPSULE BY MOUTH ONCE EVERY DAY. ?Strength: 12.5 mg [name], MD Taking Active Self  ?Olopatadine HCl 0.2 % SOLN [id number] Yes Use 1 drop in each eye once a day as needed for itchy watery eyes  ?  Patient taking differently: Place 1 drop into both eyes every 6 (six) hours as needed (itchy watery eyes).  ? Althea Charon, FNP Taking Active Self  ?ondansetron (ZOFRAN-ODT) 8 MG disintegrating tablet [id number] No Take 1 tablet (8 mg total) by mouth every 8 (eight) hours as needed for nausea or vomiting.  ?Patient not taking: Reported on 08/01/2021  ? McKenzie, Mary Furbish, MD Not Taking Active   ?oxyCODONE (OXY IR/ROXICODONE) 5 MG immediate release tablet [id number] Yes Take 1  tablet (5 mg total) by mouth every 6 (six) hours as needed for moderate pain. McKenzie, Mary Furbish, MD Taking Active   ?potassium chloride SA (KLOR-CON M) 20 MEQ tablet [id number] Yes Take 1 tablet (20 mEq total) by mouth daily. Barton Dubois, MD Taking Active Self  ?rOPINIRole (REQUIP) 1 MG tablet [id number] Yes Take 1 tablet (1 mg total) by mouth at bedtime. Barton Dubois, MD Taking Active Self  ? Patient not taking:   Discontinued 11/12/18 1700   ? ?  ?  ? ?  ? ? ?Patient Active Problem List  ? Diagnosis Date Noted  ? Hospital discharge follow-up 07/14/2021  ? Pyelonephritis 06/28/2021  ? Sepsis ([place]) 06/28/2021  ? Hydronephrosis 06/28/2021  ? Fibroadenoma of breast, left 03/31/2021  ? Pain of left breast 03/31/2021  ? Hyperprolactinemia ([place]) 02/10/2021  ? Restless legs 02/10/2021  ? Elevated prolactin level 02/10/2021  ? Encounter for gynecological examination with Papanicolaou smear of cervix 02/07/2021  ? Nipple discharge 02/07/2021  ? Hypokalemia 09/30/2020  ? Vitamin D deficiency 09/30/2020  ? Menorrhagia with irregular cycle 08/17/2020  ? Chronic right shoulder pain 07/01/2020  ? AKI (acute kidney injury) ([place]) 12/27/2019  ? Mild persistent asthma, uncomplicated 24/40/1027  ? Abnormal uterine bleeding (AUB) 03/12/2017  ? Intramural leiomyoma of uterus 08/24/2015  ? Polyclonal gammopathy 07/26/2013  ? Dysplasia of cervix, unspecified 03/31/2013  ? Hypertension 09/21/2010  ? Morbid obesity ([place]) 11/03/2007  ? ? ?Conditions to be addressed/monitored per PCP order:   community resources ? ?Care Plan : ? RN Care Manager Plan Of Care  ?Updates made by Ethelda Chick since 08/03/2021 12:00 AM  ?  ? ?Problem: Knowledge Deficit and Care Coordination Needs Related to Management of  Asthma, HTN recurrent UTIs and kidney stones   ?Priority: High  ?  ? ?Long-Range Goal: Development of Plan Of Care to Address Care Coordination Needs and Knowledge Deficits for Management of asthma, HTN, recurrent UTIs and kidney stones    ?Start Date: 08/01/2021  ?Expected End Date: 08/02/2022  ?Priority: High  ?Note:   ?Current Barriers:  ?Knowledge Deficits related to plan of care for management of HTN and asthma and recurrent UTIs with kidney stones  ?Care Coordination needs related to Financial constraints related to utility bills  ?Film/video editor.  ?Insomnia ?RNCM Clinical Goal(s):  ?Patient will verbalize understanding of plan for management of HTN, Asthma, and recurrent UTIs with kidney stones as evidenced by decreased incidences/reports of UTIs, no hospital admissions or ED visits related to complications of HTN, asthma or UTI/Kidney stones ?take all medications exactly as prescribed and will call provider for medication related questions as evidenced by patient reports of no missed medications    ?attend all scheduled medical appointments: with medical providers as evidenced by absence of no shows for medical appointments        ?demonstrate improved adherence to prescribed treatment plan for HTN, Asthma, and recurrent UTIs with kidney stones as evidenced by decreased incidences/reports of UTIs, no hospital admissions or ED related  to complications of HTN, Asthma, UTIs or kidney stones ?continue to work with Consulting civil engineer and/or Social Worker to address care management and care coordination needs related to HTN, Asthma, and recurrent UTIs and kidney stones as evidenced by adherence to CM Team Scheduled appointments     ?work with community resource care guide to address needs related to Financial constraints related to paying utility bills as evidenced by patient and/or community resource care guide support    through collaboration with Consulting civil engineer, provider, and care team.  ? ?Interventions: ?Inter-disciplinary care team collaboration (see longitudinal plan of care) ?Evaluation of current treatment plan related to  self management and patient's adherence to plan as established by provider ?Mailed written educational information to  patient's home address related in insomnia ?08/03/21: BSW completed telephone outreach with patient regarding finances. Patient stated she is still working but she is working part time. Patient states she

## 2021-08-03 NOTE — Telephone Encounter (Signed)
? ?  Telephone encounter was:  Successful.  ?08/03/2021 ?Name: [name] MRN: [id number] DOB: April 10, 1981 ? ?[name] is a 41 y.o. year old female who is a primary care patient of [name], MD . The community resource team was consulted for assistance with Financial Difficulties related to utility ? ?Care guide performed the following interventions: Patient needs help with utilities put in an [place] [address] referral for utility assistance to [facility] .. ? ?Follow Up Plan:  Care guide will follow up with patient by phone over the next days ? ?Jorita Bohanon Greenauer -Selinda Eon ?Care Guide , Embedded Care Coordination ?[place], Care Management  ?[phone] ?[address] , [place] [address] ?Email : [email or web address] ?  ? ?

## 2021-08-03 NOTE — Patient Instructions (Signed)
Visit Information ? ?[name] was given information about Medicaid Managed Care team care coordination services as a part of their Healthy [facility] Medicaid benefit. [name] verbally consented to engagement with the [facility] Managed Care team.  ? ?If you are experiencing a medical emergency, please call 911 or report to your local emergency department or urgent care.  ? ?If you have a non-emergency medical problem during routine business hours, please contact your provider's office and ask to speak with a nurse.  ? ?For questions related to your Healthy [facility] health plan, please call: [phone] or visit the homepage here: [email or web address] ? ?If you would like to schedule transportation through your Healthy [facility] plan, please call the following number at least 2 days in advance of your appointment: [phone] ? For information about your ride after you set it up, call Ride Assist at [phone]. Use this number to activate a Will Call pickup, or if your transportation is late for a scheduled pickup. Use this number, too, if you need to make a change or cancel a previously scheduled reservation. ? If you need transportation services right away, call [phone]. The after-hours call center is staffed 24 hours to handle ride assistance and urgent reservation requests (including discharges) 365 days a year. Urgent trips include sick visits, hospital discharge requests and life-sustaining treatment. ? ?Call the [facility] at [phone], at any time, 24 hours a day, 7 days a week. If you are in danger or need immediate medical attention call 911. ? ?If you would like help to quit smoking, call 1-800-QUIT-NOW 848-423-0134) OR Espa?ol: 1-855-D?jelo-Ya 516-862-7228) o para m?s informaci?n haga clic aqu? or Text READY to 200-400 to register via text ? ?[name] - following are the goals we discussed in your visit  today:  ? Goals Addressed   ?None ?  ? ? ?Social Worker will follow up in 30 days .  ? ?[name], BSW, [place] [facility]  ?High Risk Managed Medicaid Team  ?[phone]  ? ?Following is a copy of your plan of care:  ?Care Plan : ? [facility]  ?Updates made by [name] since 08/03/2021 12:00 AM  ?  ? ?Problem: Knowledge Deficit and Care Coordination Needs Related to Management of  Asthma, HTN recurrent UTIs and kidney stones   ?Priority: High  ?  ? ?Long-Range Goal: Development of Plan Of Care to Address Care Coordination Needs and Knowledge Deficits for Management of asthma, HTN, recurrent UTIs and kidney stones   ?Start Date: 08/01/2021  ?Expected End Date: 08/02/2022  ?Priority: High  ?Note:   ?Current Barriers:  ?Knowledge Deficits related to plan of care for management of HTN and asthma and recurrent UTIs with kidney stones  ?Care Coordination needs related to Financial constraints related to utility bills  ?Film/video editor.  ?Insomnia ?RNCM Clinical Goal(s):  ?Patient will verbalize understanding of plan for management of HTN, Asthma, and recurrent UTIs with kidney stones as evidenced by decreased incidences/reports of UTIs, no hospital admissions or ED visits related to complications of HTN, asthma or UTI/Kidney stones ?take all medications exactly as prescribed and will call provider for medication related questions as evidenced by patient reports of no missed medications    ?attend all scheduled medical appointments: with medical providers as evidenced by absence of no shows for medical appointments        ?demonstrate improved adherence to prescribed treatment plan for HTN, Asthma, and  recurrent UTIs with kidney stones as evidenced by decreased incidences/reports of UTIs, no hospital admissions or ED related to complications of HTN, Asthma, UTIs or kidney stones ?continue to work with RN Care Manager and/or Social Worker to address care  management and care coordination needs related to HTN, Asthma, and recurrent UTIs and kidney stones as evidenced by adherence to CM Team Scheduled appointments     ?work with community resource care guide to address needs related to Financial constraints related to paying utility bills as evidenced by patient and/or community resource care guide support    through collaboration with Consulting civil engineer, provider, and care team.  ? ?Interventions: ?Inter-disciplinary care team collaboration (see longitudinal plan of care) ?Evaluation of current treatment plan related to  self management and patient's adherence to plan as established by provider ?Mailed written educational information to patient's home address related in insomnia ?08/03/21: BSW completed telephone outreach with patient regarding finances. Patient stated she is still working but she is working part time. Patient states she does need assistance with her rent, she is about 800 behind and utilities. Patient states she has to check to see if she has a cut off notice. BSW will email resources to patient at [email or web address]. ? 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, reports having difficulty managing household bills, say her Mom provides babysitting services for her 68 month old daughter when she is able to work  ?Collaborated with BSW to initiate plan of care to address needs related to Financial constraints related to payment of household bills  in patient with HTN, Asthma, and recurrent UTIs with kidney stones ?Collaboration with Mary Spar, MD regarding development and update of comprehensive plan of care as evidenced by provider attestation and co-signature ?Inter-disciplinary care team collaboration   ?Scheduled initial  telephone assessment with Managed Medicaid BSW to address financial insecurity and complete SDOH screening ?Referral to [facility] care guide for household bills assistance/ resources, dental and ophthalmology resources  ? ?Asthma: (Status: New goal.) Long Term Goal - patient states pollen is a trigger, reports last asthma attack was in January and she was able to self treat with rescue inhaler ?Provided patient with basic written and verbal Asthma education on self care/management/and exacerbation prevention; ?Advised patient to track and manage Asthma triggers;  ?Provided instruction about proper use of medications used for management of Asthma including inhalers; ?Advised patient to self assesses Asthma action plan zone and make appointment with provider if in the yellow zone for 48 hours without improvement; ?Reviewed purpose of Advair and prescribed frequency of twice daily not once daily as she is currently doing ?Mailed written educational information to patient's home address ? ?Recurrent UTIs and Kidney Stones - s/p OP surgery for removal of kidney stone and ureteral stent on 3/30/223 (Status: New goal.) Long Term Goal  ?Evaluation of current treatment plan related to  recurrent UTIs and kidney stones ,  self-management and patient's adherence to plan as established by provider. ?Discussed plans with patient for ongoing care management follow up and provided patient with direct contact information for care management team ?Provided education to patient re: prevention of UTIs and kidney stones; mailed written educational information to patient's home address ?Reviewed follow up with urologist on 08/04/21 ? ?Hypertension: (Status: New goal.) Long Term Goal  ?  Last practice recorded BP readings:  ?BP Readings from Last 3 Encounters:  ?07/27/21 135/89  ?07/24/21 (!) 142/92  ?07/14/21 118/80  ?Most recent eGFR/CrCl:  ?Lab Results  ?Component Value Date  ? EGFR 93 09/28/2020  ?  No components found for:  CRCL ? ?Evaluation of current treatment plan related to hypertension self management and patient's adherence to plan as established by provider;   ?Provided education to patient re: stroke prevention, s/s of heart attack and str

## 2021-08-04 ENCOUNTER — Ambulatory Visit (INDEPENDENT_AMBULATORY_CARE_PROVIDER_SITE_OTHER): Payer: Medicaid Other | Admitting: Urology

## 2021-08-04 VITALS — BP 135/87 | HR 77

## 2021-08-04 DIAGNOSIS — M549 Dorsalgia, unspecified: Secondary | ICD-10-CM | POA: Diagnosis not present

## 2021-08-04 DIAGNOSIS — G8929 Other chronic pain: Secondary | ICD-10-CM | POA: Diagnosis not present

## 2021-08-04 DIAGNOSIS — N200 Calculus of kidney: Secondary | ICD-10-CM | POA: Diagnosis not present

## 2021-08-04 LAB — URINALYSIS, ROUTINE W REFLEX MICROSCOPIC
Bilirubin, UA: NEGATIVE
Glucose, UA: NEGATIVE
Ketones, UA: NEGATIVE
Leukocytes,UA: NEGATIVE
Nitrite, UA: NEGATIVE
Specific Gravity, UA: 1.02 (ref 1.005–1.030)
Urobilinogen, Ur: 0.2 mg/dL (ref 0.2–1.0)
pH, UA: 7 (ref 5.0–7.5)

## 2021-08-04 LAB — MICROSCOPIC EXAMINATION
Epithelial Cells (non renal): >10 /hpf — AB (ref 0–10)
Renal Epithel, UA: NONE SEEN /hpf

## 2021-08-04 MED ORDER — CYCLOBENZAPRINE HCL 5 MG PO TABS: 5.0000 mg | ORAL_TABLET | Freq: Two times a day (BID) | ORAL | 1 refills | Status: DC | PRN

## 2021-08-04 MED ORDER — MIRABEGRON ER 25 MG PO TB24: 25.0000 mg | ORAL_TABLET | Freq: Every day | ORAL | 0 refills | Status: DC

## 2021-08-04 NOTE — Progress Notes (Signed)
? ?08/04/2021 ?10:11 AM  ? ?[place] ?May 29, 1980 ?[id number] ? ?Referring provider: [name], MD ?[address] ?[place],  [place] [address] ? ?Nephrolithiasis and back pain ? ? ?HPI: ?[name] is a 41yo here for followup for nephrolithiasis and back pain. She has been doing well since ureteroscopy and stent removal. She denies any worsening LUTS. No hematuria. She denies flank pain. No stone events since last visit. She has chronic low back pain that is worse with activity. No exacerbating/alleviating events.  ? ? ?PMH: ?Past Medical History:  ?Diagnosis Date  ? Allergy   ? banana/pineapple  ? Anemia   ? post pregnancy  ? Anxiety   ? Asthma   ? Autoimmune disease, not elsewhere classified(279.49) 2013  ? Non specific- [facility], abnormal Bone Marrow signal  ? Breast lump in female 11/28/2012  ? Has tender round mass at 10-11 0'clock right breast 4 finger breaths from [place] will [place]  ? Breast mass, right 08/18/2012  ? Chlamydia   ? Chronic breast pain 05/31/2015  ? Dysmenorrhea 03/23/2014  ? Dyspareunia 01/05/2014  ? Elevated prolactin level 02/10/2021  ? Was 54.5, recheck in December ________  ? Fibroids, intramural 08/24/2015  ? GERD (gastroesophageal reflux disease)   ? Gonorrhea   ? Headache(784.0)   ? History of abnormal cervical Pap smear 04/02/2014  ? Hypertension   ? LLQ abdominal tenderness 05/31/2015  ? Menorrhagia 01/05/2014  ? Neuromuscular disorder ([place])   ? lower back and bilat leg pain  ? Obesity   ? Other and unspecified ovarian cyst 01/12/2014  ? Polyclonal gammopathy 07/26/2013  ? Insignificant  ? Recurrent upper respiratory infection (URI)   ? Scoliosis   ? Urticaria   ? Vaginal irritation 09/30/2014  ? Vaginal Pap smear, abnormal   ? ? ?Surgical History: ?Past Surgical History:  ?Procedure Laterality Date  ? ADENOIDECTOMY    ? BREAST BIOPSY Left 02/2021  ? CYSTOSCOPY W/ URETERAL STENT PLACEMENT Left 06/29/2021  ? Procedure: CYSTOSCOPY WITH RETROGRADE PYELOGRAM/URETERAL STENT  PLACEMENT;  Surgeon: Cleon Gustin, MD;  Location: [facility];  Service: Urology;  Laterality: Left;  ? CYSTOSCOPY WITH RETROGRADE PYELOGRAM, URETEROSCOPY AND STENT PLACEMENT Left 07/27/2021  ? Procedure: CYSTOSCOPY WITH RETROGRADE PYELOGRAM, URETEROSCOPY, STONE EXTRACTION WITH BASKET;  Surgeon: Cleon Gustin, MD;  Location: [facility];  Service: Urology;  Laterality: Left;  ? DILATATION AND CURETTAGE/HYSTEROSCOPY WITH MINERVA N/A 03/14/2021  ? Procedure: HYSTEROSCOPY WITH MINERVA;  Surgeon: Florian Buff, MD;  Location: [facility];  Service: Gynecology;  Laterality: N/A;  ? DILATION AND CURETTAGE OF UTERUS    ? LAPAROSCOPIC BILATERAL SALPINGECTOMY Bilateral 03/16/2020  ? Procedure: LAPAROSCOPIC BILATERAL SALPINGECTOMY;  Surgeon: Florian Buff, MD;  Location: [facility];  Service: Gynecology;  Laterality: Bilateral;  ? SHOULDER SURGERY Right 03/10/2019  ? TONSILLECTOMY    ? TUBAL LIGATION N/A   ? Phreesia 06/28/2020  ? ? ?Home Medications:  ?Allergies as of 08/04/2021   ? ?   Reactions  ? Banana Rash  ? Pineapple Rash  ? ?  ? ?  ?Medication List  ?  ? ?  ? Accurate as of August 04, 2021 10:11 AM. If you have any questions, ask your nurse or doctor.  ?  ?  ? ?  ? ?acetaminophen 500 MG tablet ?Commonly known as: TYLENOL ?Take 1,000 mg by mouth every 6 (six) hours as needed for mild pain. ?  ?albuterol 108 (90 Base) MCG/ACT inhaler ?Commonly known as: Proventil HFA ?Inhale 2  puffs into the lungs every 6 (six) hours as needed for wheezing (cough). ?  ?amLODipine 5 MG tablet ?Commonly known as: NORVASC ?Take 1 tablet (5 mg total) by mouth daily. ?  ?Blood Pressure Cuff Misc ?Large cuff. ICD10: I10. Check blood pressure once daily. Please contact office if blood pressure above 150/90. ?  ?EPINEPHrine 0.3 mg/0.3 mL Soaj injection ?Commonly known as: EPI-PEN ?Inject 0.3 mg into the muscle as needed for anaphylaxis. ?  ?fluticasone 50 MCG/ACT nasal spray ?Commonly known as: FLONASE ?Place 2 sprays into both nostrils daily as needed  for allergies or rhinitis. ?  ?fluticasone-salmeterol 115-21 MCG/ACT inhaler ?Commonly known as: ADVAIR HFA ?Inhale 2 puffs into the lungs 2 (two) times daily. ?What changed: when to take this ?  ?hydrochlorothiazide 12.5 MG capsule ?Commonly known as: MICROZIDE ?Take 1 capsule (12.5 mg total) by mouth daily. TAKE (1) CAPSULE BY MOUTH ONCE EVERY DAY. ?Strength: 12.5 mg ?  ?Olopatadine HCl 0.2 % Soln ?Use 1 drop in each eye once a day as needed for itchy watery eyes ?What changed:  ?how much to take ?how to take this ?when to take this ?reasons to take this ?additional instructions ?  ?ondansetron 8 MG disintegrating tablet ?Commonly known as: ZOFRAN-ODT ?Take 1 tablet (8 mg total) by mouth every 8 (eight) hours as needed for nausea or vomiting. ?  ?oxyCODONE 5 MG immediate release tablet ?Commonly known as: Oxy IR/ROXICODONE ?Take 1 tablet (5 mg total) by mouth every 6 (six) hours as needed for moderate pain. ?  ?potassium chloride SA 20 MEQ tablet ?Commonly known as: KLOR-CON M ?Take 1 tablet (20 mEq total) by mouth daily. ?  ?rOPINIRole 1 MG tablet ?Commonly known as: REQUIP ?Take 1 tablet (1 mg total) by mouth at bedtime. ?  ? ?  ? ? ?Allergies:  ?Allergies  ?Allergen Reactions  ? Banana Rash  ? Pineapple Rash  ? ? ?Family History: ?Family History  ?Problem Relation Age of Onset  ? Hypertension Mother   ? Diabetes Mother   ? Heart disease Mother   ? Diabetes Father   ? Cancer Paternal Aunt   ?     breast  ? Hypertension Maternal Grandmother   ? Diabetes Son   ?     pre diabetic  ? Other Son   ?     overactive bladder  ? COPD Maternal Aunt   ? Leukemia Maternal Aunt   ? Allergic rhinitis Neg Hx   ? Angioedema Neg Hx   ? Asthma Neg Hx   ? Atopy Neg Hx   ? Eczema Neg Hx   ? Immunodeficiency Neg Hx   ? Urticaria Neg Hx   ? ? ?Social History:  reports that she has never smoked. She has never used smokeless tobacco. She reports that she does not drink alcohol and does not use drugs. ? ?ROS: ?All other review of systems  were reviewed and are negative except what is noted above in HPI ? ?Physical Exam: ?BP 135/87   Pulse 77   ?Constitutional:  Alert and oriented, No acute distress. ?HEENT: [place] AT, moist mucus membranes.  Trachea midline, no masses. ?Cardiovascular: No clubbing, cyanosis, or edema. ?Respiratory: Normal respiratory effort, no increased work of breathing. ?GI: Abdomen is soft, nontender, nondistended, no abdominal masses ?GU: No CVA tenderness.  ?Lymph: No cervical or inguinal lymphadenopathy. ?Skin: No rashes, bruises or suspicious lesions. ?Neurologic: Grossly intact, no focal deficits, moving all 4 extremities. ?Psychiatric: Normal mood and affect. ? ?Laboratory Data: ?Lab  Results  ?Component Value Date  ? WBC 9.0 07/10/2021  ? HGB 12.4 07/10/2021  ? HCT 38.4 07/10/2021  ? MCV 91.4 07/10/2021  ? PLT 373 07/10/2021  ? ? ?Lab Results  ?Component Value Date  ? CREATININE 0.75 07/10/2021  ? ? ?No results found for: PSA ? ?No results found for: TESTOSTERONE ? ?Lab Results  ?Component Value Date  ? HGBA1C 5.4 09/28/2020  ? ? ?Urinalysis ?   ?Component Value Date/Time  ? COLORURINE AMBER (A) 07/10/2021 0936  ? APPEARANCEUR CLOUDY (A) 07/10/2021 0936  ? LABSPEC 1.020 07/10/2021 0936  ? PHURINE 7.0 07/10/2021 0936  ? GLUCOSEU 50 (A) 07/10/2021 0936  ? HGBUR LARGE (A) 07/10/2021 0936  ? HGBUR small 02/03/2008 1438  ? [place] NEGATIVE 07/10/2021 0936  ? BILIRUBINUR small (A) 01/01/2021 1149  ? BILIRUBINUR negative 01/11/2012 1714  ? KETONESUR 5 (A) 07/10/2021 0936  ? PROTEINUR >=300 (A) 07/10/2021 0936  ? UROBILINOGEN 1.0 01/01/2021 1149  ? UROBILINOGEN 1.0 12/29/2011 2254  ? NITRITE NEGATIVE 07/10/2021 0936  ? LEUKOCYTESUR SMALL (A) 07/10/2021 0936  ? ? ?Lab Results  ?Component Value Date  ? BACTERIA NONE SEEN 07/10/2021  ? ? ?Pertinent Imaging: ? ?No results found for this or any previous visit. ? ?No results found for this or any previous visit. ? ?No results found for this or any previous visit. ? ?No results found for  this or any previous visit. ? ?Results for orders placed during the hospital encounter of 03/30/20 ? ?US RENAL ? ?Narrative ?CLINICAL DATA:  Acute kidney failure with tubular necrosis in this ?41 year old fe

## 2021-08-07 ENCOUNTER — Telehealth: Payer: Self-pay | Admitting: *Deleted

## 2021-08-07 NOTE — Telephone Encounter (Signed)
? ?  Telephone encounter was:  Unsuccessful.  08/07/2021 ?Name: [name] MRN: [id number] DOB: 1980/08/18 ? ?Unsuccessful outbound call made today to assist with: Follow up ? ?Outreach Attempt:  1st Attempt ? ?A HIPAA compliant voice message was left requesting a return call.  Instructed patient to call back at   Instructed patient to call back at [phone]  at their earliest convenience. . ? ?[name] ?Care Guide , Embedded Care Coordination ?[facility], Care Management  ?[phone] ?[address] , [place] [address] ?Email : [email or web address] ?  ?

## 2021-08-11 ENCOUNTER — Telehealth: Payer: Self-pay | Admitting: *Deleted

## 2021-08-11 NOTE — Telephone Encounter (Signed)
? ?  Telephone encounter was:  Successful.  ?08/11/2021 ?Name: [name] MRN: [id number] DOB: 02/18/1981 ? ?[name] is a 41 y.o. year old female who is a primary care patient of [name], MD . The community resource team was consulted for assistance with Electric help at dss unavailable put in [place] referral suggested trying [facility] , emailed and received eye and dental referrals  ? ?Care guide performed the following interventions: Patient provided with information about care guide support team and interviewed to confirm resource needs. ? ?Follow Up Plan:  No further follow up planned at this time. The patient has been provided with needed resources. ? ?Lovett Sox -Selinda Eon ?Care Guide , Embedded Care Coordination ?[place], Care Management  ?[phone] ?[address] , [place] [address] ?Email : [email or web address] ?   ?

## 2021-08-14 ENCOUNTER — Other Ambulatory Visit: Payer: Self-pay | Admitting: Internal Medicine

## 2021-08-14 ENCOUNTER — Encounter: Payer: Self-pay | Admitting: Internal Medicine

## 2021-08-14 ENCOUNTER — Ambulatory Visit: Payer: Medicaid Other | Admitting: Internal Medicine

## 2021-08-14 VITALS — BP 134/88 | HR 89 | Resp 16 | Ht 65.0 in | Wt 284.4 lb

## 2021-08-14 DIAGNOSIS — N12 Tubulo-interstitial nephritis, not specified as acute or chronic: Secondary | ICD-10-CM

## 2021-08-14 DIAGNOSIS — N179 Acute kidney failure, unspecified: Secondary | ICD-10-CM

## 2021-08-14 DIAGNOSIS — E221 Hyperprolactinemia: Secondary | ICD-10-CM | POA: Diagnosis not present

## 2021-08-14 MED ORDER — CEFPODOXIME PROXETIL 200 MG PO TABS: 200.0000 mg | ORAL_TABLET | Freq: Two times a day (BID) | ORAL | 0 refills | Status: DC

## 2021-08-14 MED ORDER — CIPROFLOXACIN HCL 500 MG PO TABS: 500.0000 mg | ORAL_TABLET | Freq: Two times a day (BID) | ORAL | 0 refills | Status: AC

## 2021-08-14 NOTE — Patient Instructions (Signed)
Please start taking Vantin as prescribed. ? ?Please continue to take at least 64 ounces of fluid in a day. ?

## 2021-08-15 ENCOUNTER — Encounter: Payer: Self-pay | Admitting: Urology

## 2021-08-15 DIAGNOSIS — E221 Hyperprolactinemia: Secondary | ICD-10-CM | POA: Diagnosis not present

## 2021-08-15 DIAGNOSIS — N12 Tubulo-interstitial nephritis, not specified as acute or chronic: Secondary | ICD-10-CM | POA: Diagnosis not present

## 2021-08-15 DIAGNOSIS — N179 Acute kidney failure, unspecified: Secondary | ICD-10-CM | POA: Diagnosis not present

## 2021-08-15 LAB — MICROSCOPIC EXAMINATION
Bacteria, UA: NONE SEEN
Casts: NONE SEEN /lpf
WBC, UA: NONE SEEN /hpf (ref 0–5)

## 2021-08-15 LAB — UA/M W/RFLX CULTURE, ROUTINE
Bilirubin, UA: NEGATIVE
Glucose, UA: NEGATIVE
Ketones, UA: NEGATIVE
Leukocytes,UA: NEGATIVE
Nitrite, UA: NEGATIVE
Protein,UA: NEGATIVE
RBC, UA: NEGATIVE
Specific Gravity, UA: 1.019 (ref 1.005–1.030)
Urobilinogen, Ur: 0.2 mg/dL (ref 0.2–1.0)
pH, UA: 7 (ref 5.0–7.5)

## 2021-08-15 NOTE — Patient Instructions (Signed)
Dietary Guidelines to Help Prevent Kidney Stones Kidney stones are deposits of minerals and salts that form inside your kidneys. Your risk of developing kidney stones may be greater depending on your diet, your lifestyle, the medicines you take, and whether you have certain medical conditions. Most people can lower their chances of developing kidney stones by following the instructions below. Your dietitian may give you more specific instructions depending on your overall health and the type of kidney stones you tend to develop. What are tips for following this plan? Reading food labels  Choose foods with "no salt added" or "low-salt" labels. Limit your salt (sodium) intake to less than 1,500 mg a day. Choose foods with calcium for each meal and snack. Try to eat about 300 mg of calcium at each meal. Foods that contain 200-500 mg of calcium a serving include: 8 oz (237 mL) of milk, calcium-fortifiednon-dairy milk, and calcium-fortifiedfruit juice. Calcium-fortified means that calcium has been added to these drinks. 8 oz (237 mL) of kefir, yogurt, and soy yogurt. 4 oz (114 g) of tofu. 1 oz (28 g) of cheese. 1 cup (150 g) of dried figs. 1 cup (91 g) of cooked broccoli. One 3 oz (85 g) can of sardines or mackerel. Most people need 1,000-1,500 mg of calcium a day. Talk to your dietitian about how much calcium is recommended for you. Shopping Buy plenty of fresh fruits and vegetables. Most people do not need to avoid fruits and vegetables, even if these foods contain nutrients that may contribute to kidney stones. When shopping for convenience foods, choose: Whole pieces of fruit. Pre-made salads with dressing on the side. Low-fat fruit and yogurt smoothies. Avoid buying frozen meals or prepared deli foods. These can be high in sodium. Look for foods with live cultures, such as yogurt and kefir. Choose high-fiber grains, such as whole-wheat breads, oat bran, and wheat cereals. Cooking Do not add  salt to food when cooking. Place a salt shaker on the table and allow each person to add his or her own salt to taste. Use vegetable protein, such as beans, textured vegetable protein (TVP), or tofu, instead of meat in pasta, casseroles, and soups. Meal planning Eat less salt, if told by your dietitian. To do this: Avoid eating processed or pre-made food. Avoid eating fast food. Eat less animal protein, including cheese, meat, poultry, or fish, if told by your dietitian. To do this: Limit the number of times you have meat, poultry, fish, or cheese each week. Eat a diet free of meat at least 2 days a week. Eat only one serving each day of meat, poultry, fish, or seafood. When you prepare animal protein, cut pieces into small portion sizes. For most meat and fish, one serving is about the size of the palm of your hand. Eat at least five servings of fresh fruits and vegetables each day. To do this: Keep fruits and vegetables on hand for snacks. Eat one piece of fruit or a handful of berries with breakfast. Have a salad and fruit at lunch. Have two kinds of vegetables at dinner. Limit foods that are high in a substance called oxalate. These include: Spinach (cooked), rhubarb, beets, sweet potatoes, and Swiss chard. Peanuts. Potato chips, french fries, and baked potatoes with skin on. Nuts and nut products. Chocolate. If you regularly take a diuretic medicine, make sure to eat at least 1 or 2 servings of fruits or vegetables that are high in potassium each day. These include: Avocado. Banana. Orange, prune,   carrot, or tomato juice. Baked potato. Cabbage. Beans and split peas. Lifestyle  Drink enough fluid to keep your urine pale yellow. This is the most important thing you can do. Spread your fluid intake throughout the day. If you drink alcohol: Limit how much you use to: 0-1 drink a day for women who are not pregnant. 0-2 drinks a day for men. Be aware of how much alcohol is in your  drink. In the [place] one drink equals one 12 oz bottle of beer (355 mL), one 5 oz glass of wine (148 mL), or one 1 oz glass of hard liquor (44 mL). Lose weight if told by your health care provider. Work with your dietitian to find an eating plan and weight loss strategies that work best for you. General information Talk to your health care provider and dietitian about taking daily supplements. You may be told the following depending on your health and the cause of your kidney stones: Not to take supplements with vitamin C. To take a calcium supplement. To take a daily probiotic supplement. To take other supplements such as magnesium, fish oil, or vitamin B6. Take over-the-counter and prescription medicines only as told by your health care provider. These include supplements. What foods should I limit? Limit your intake of the following foods, or eat them as told by your dietitian. Vegetables Spinach. Rhubarb. Beets. Canned vegetables. Pickles. Olives. Baked potatoes with skin. Grains Wheat bran. Baked goods. Salted crackers. Cereals high in sugar. Meats and other proteins Nuts. Nut butters. Large portions of meat, poultry, or fish. Salted, precooked, or cured meats, such as sausages, meat loaves, and hot dogs. Dairy Cheese. Beverages Regular soft drinks. Regular vegetable juice. Seasonings and condiments Seasoning blends with salt. Salad dressings. Soy sauce. Ketchup. Barbecue sauce. Other foods Canned soups. Canned pasta sauce. Casseroles. Pizza. Lasagna. Frozen meals. Potato chips. French fries. The items listed above may not be a complete list of foods and beverages you should limit. Contact a dietitian for more information. What foods should I avoid? Talk to your dietitian about specific foods you should avoid based on the type of kidney stones you have and your overall health. Fruits Grapefruit. The item listed above may not be a complete list of foods and beverages you should  avoid. Contact a dietitian for more information. Summary Kidney stones are deposits of minerals and salts that form inside your kidneys. You can lower your risk of kidney stones by making changes to your diet. The most important thing you can do is drink enough fluid. Drink enough fluid to keep your urine pale yellow. Talk to your dietitian about how much calcium you should have each day, and eat less salt and animal protein as told by your dietitian. This information is not intended to replace advice given to you by your health care provider. Make sure you discuss any questions you have with your health care provider. Document Revised: 12/26/2020 Document Reviewed: 12/26/2020 Elsevier Patient Education  2023 Elsevier Inc.  

## 2021-08-16 LAB — BASIC METABOLIC PANEL
BUN/Creatinine Ratio: 16 (ref 9–23)
BUN: 10 mg/dL (ref 6–20)
CO2: 23 mmol/L (ref 20–29)
Calcium: 8.7 mg/dL (ref 8.7–10.2)
Chloride: 101 mmol/L (ref 96–106)
Creatinine, Ser: 0.64 mg/dL (ref 0.57–1.00)
Glucose: 103 mg/dL — ABNORMAL HIGH (ref 70–99)
Potassium: 3.3 mmol/L — ABNORMAL LOW (ref 3.5–5.2)
Sodium: 137 mmol/L (ref 134–144)
eGFR: 116 mL/min/{1.73_m2} (ref >59)

## 2021-08-16 LAB — PROLACTIN: Prolactin: 16.4 ng/mL (ref 4.8–23.3)

## 2021-08-17 DIAGNOSIS — N179 Acute kidney failure, unspecified: Secondary | ICD-10-CM | POA: Insufficient documentation

## 2021-08-17 HISTORY — DX: Acute kidney failure, unspecified: N17.9

## 2021-08-17 NOTE — Progress Notes (Signed)
? ?Acute Office Visit ? ?Subjective:  ? ? Patient ID: [name], female    DOB: 06-26-1980, 41 y.o.   MRN: [id number] ? ?Chief Complaint  ?Patient presents with  ? Acute Visit  ?  Pt had kidney stones removed 07-27-21 still having pain and pressure on the left side   ? ? ?HPI ?Patient is in today for complaint of dysuria and left-sided flank pain for the last 2 weeks.  She denies any hematuria, nausea or vomiting currently.  She had ureteroscopy with stone extraction on 03/30. She had episode of pyelonephritis before that, which was treated with IV ciprofloxacin and oral cefdinir later. ? ?She still complains of nipple discharge from the left breast.  She has had biopsy of the lesion, which was found to be fibroadenoma.  She follows up with OB/GYN for that.  Her prolactin level was elevated in the past.  She denies any visual disturbance currently. ? ?Past Medical History:  ?Diagnosis Date  ? Allergy   ? banana/pineapple  ? Anemia   ? post pregnancy  ? Anxiety   ? Asthma   ? Autoimmune disease, not elsewhere classified(279.49) 2013  ? Non specific- [facility], abnormal Bone Marrow signal  ? Breast lump in female 11/28/2012  ? Has tender round mass at 10-11 0'clock right breast 4 finger breaths from [place] will [place]  ? Breast mass, right 08/18/2012  ? Chlamydia   ? Chronic breast pain 05/31/2015  ? Dysmenorrhea 03/23/2014  ? Dyspareunia 01/05/2014  ? Elevated prolactin level 02/10/2021  ? Was 54.5, recheck in December ________  ? Fibroids, intramural 08/24/2015  ? GERD (gastroesophageal reflux disease)   ? Gonorrhea   ? Headache(784.0)   ? History of abnormal cervical Pap smear 04/02/2014  ? Hypertension   ? LLQ abdominal tenderness 05/31/2015  ? Menorrhagia 01/05/2014  ? Neuromuscular disorder ([place])   ? lower back and bilat leg pain  ? Obesity   ? Other and unspecified ovarian cyst 01/12/2014  ? Polyclonal gammopathy 07/26/2013  ? Insignificant  ? Recurrent upper respiratory infection (URI)   ? Scoliosis   ?  Urticaria   ? Vaginal irritation 09/30/2014  ? Vaginal Pap smear, abnormal   ? ? ?Past Surgical History:  ?Procedure Laterality Date  ? ADENOIDECTOMY    ? BREAST BIOPSY Left 02/2021  ? CYSTOSCOPY W/ URETERAL STENT PLACEMENT Left 06/29/2021  ? Procedure: CYSTOSCOPY WITH RETROGRADE PYELOGRAM/URETERAL STENT PLACEMENT;  Surgeon: Cleon Gustin, MD;  Location: [facility];  Service: Urology;  Laterality: Left;  ? CYSTOSCOPY WITH RETROGRADE PYELOGRAM, URETEROSCOPY AND STENT PLACEMENT Left 07/27/2021  ? Procedure: CYSTOSCOPY WITH RETROGRADE PYELOGRAM, URETEROSCOPY, STONE EXTRACTION WITH BASKET;  Surgeon: Cleon Gustin, MD;  Location: [facility];  Service: Urology;  Laterality: Left;  ? DILATATION AND CURETTAGE/HYSTEROSCOPY WITH MINERVA N/A 03/14/2021  ? Procedure: HYSTEROSCOPY WITH MINERVA;  Surgeon: Florian Buff, MD;  Location: [facility];  Service: Gynecology;  Laterality: N/A;  ? DILATION AND CURETTAGE OF UTERUS    ? LAPAROSCOPIC BILATERAL SALPINGECTOMY Bilateral 03/16/2020  ? Procedure: LAPAROSCOPIC BILATERAL SALPINGECTOMY;  Surgeon: Florian Buff, MD;  Location: [facility];  Service: Gynecology;  Laterality: Bilateral;  ? SHOULDER SURGERY Right 03/10/2019  ? TONSILLECTOMY    ? TUBAL LIGATION N/A   ? Phreesia 06/28/2020  ? ? ?Family History  ?Problem Relation Age of Onset  ? Hypertension Mother   ? Diabetes Mother   ? Heart disease Mother   ? Diabetes Father   ? Cancer Paternal Aunt   ?  breast  ? Hypertension Maternal Grandmother   ? Diabetes Son   ?     pre diabetic  ? Other Son   ?     overactive bladder  ? COPD Maternal Aunt   ? Leukemia Maternal Aunt   ? Allergic rhinitis Neg Hx   ? Angioedema Neg Hx   ? Asthma Neg Hx   ? Atopy Neg Hx   ? Eczema Neg Hx   ? Immunodeficiency Neg Hx   ? Urticaria Neg Hx   ? ? ?Social History  ? ?Socioeconomic History  ? Marital status: Legally Separated  ?  Spouse name: Nani Ravens  ? Number of children: 2  ? Years of education: Not on file  ? Highest education level: Not on file   ?Occupational History  ? Not on file  ?Tobacco Use  ? Smoking status: Never  ? Smokeless tobacco: Never  ?Vaping Use  ? Vaping Use: Never used  ?Substance and Sexual Activity  ? Alcohol use: No  ? Drug use: No  ? Sexual activity: Not Currently  ?  Birth control/protection: Surgical  ?  Comment: tubal & ablation  ?Other Topics Concern  ? Not on file  ?Social History Narrative  ? Not on file  ? ?Social Determinants of Health  ? ?Financial Resource Strain: High Risk  ? Difficulty of Paying Living Expenses: Very hard  ?Food Insecurity: No Food Insecurity  ? Worried About Charity fundraiser in the Last Year: Never true  ? Ran Out of Food in the Last Year: Never true  ?Transportation Needs: No Transportation Needs  ? Lack of Transportation (Medical): No  ? Lack of Transportation (Non-Medical): No  ?Physical Activity: Not on file  ?Stress: Not on file  ?Social Connections: Not on file  ?Intimate Partner Violence: Not on file  ? ? ?Outpatient Medications Prior to Visit  ?Medication Sig Dispense Refill  ? acetaminophen (TYLENOL) 500 MG tablet Take 1,000 mg by mouth every 6 (six) hours as needed for mild pain.    ? albuterol (PROVENTIL HFA) 108 (90 Base) MCG/ACT inhaler Inhale 2 puffs into the lungs every 6 (six) hours as needed for wheezing (cough). 8 g 5  ? amLODipine (NORVASC) 5 MG tablet Take 1 tablet (5 mg total) by mouth daily. 90 tablet 0  ? Blood Pressure Monitoring (BLOOD PRESSURE CUFF) MISC Large cuff. ICD10: I10. Check blood pressure once daily. Please contact office if blood pressure above 150/90. 1 each 0  ? cyclobenzaprine (FLEXERIL) 5 MG tablet Take 1 tablet (5 mg total) by mouth 2 (two) times daily as needed for muscle spasms. 30 tablet 1  ? EPINEPHrine 0.3 mg/0.3 mL IJ SOAJ injection Inject 0.3 mg into the muscle as needed for anaphylaxis. 1 each 1  ? fluticasone (FLONASE) 50 MCG/ACT nasal spray Place 2 sprays into both nostrils daily as needed for allergies or rhinitis.    ? fluticasone-salmeterol (ADVAIR  HFA) 115-21 MCG/ACT inhaler Inhale 2 puffs into the lungs 2 (two) times daily. (Patient taking differently: Inhale 2 puffs into the lungs daily.) 1 each 12  ? hydrochlorothiazide (MICROZIDE) 12.5 MG capsule Take 1 capsule (12.5 mg total) by mouth daily. TAKE (1) CAPSULE BY MOUTH ONCE EVERY DAY. ?Strength: 12.5 mg 90 capsule 0  ? mirabegron ER (MYRBETRIQ) 25 MG TB24 tablet Take 1 tablet (25 mg total) by mouth daily. 30 tablet 0  ? Olopatadine HCl 0.2 % SOLN Use 1 drop in each eye once a day as needed for itchy watery  eyes (Patient taking differently: Place 1 drop into both eyes every 6 (six) hours as needed (itchy watery eyes).) 2.5 mL 3  ? ondansetron (ZOFRAN-ODT) 8 MG disintegrating tablet Take 1 tablet (8 mg total) by mouth every 8 (eight) hours as needed for nausea or vomiting. 30 tablet 0  ? potassium chloride SA (KLOR-CON M) 20 MEQ tablet Take 1 tablet (20 mEq total) by mouth daily. 30 tablet 0  ? rOPINIRole (REQUIP) 1 MG tablet Take 1 tablet (1 mg total) by mouth at bedtime. 30 tablet 1  ? oxyCODONE (OXY IR/ROXICODONE) 5 MG immediate release tablet Take 1 tablet (5 mg total) by mouth every 6 (six) hours as needed for moderate pain. (Patient not taking: Reported on 08/14/2021) 30 tablet 0  ? ?No facility-administered medications prior to visit.  ? ? ?Allergies  ?Allergen Reactions  ? Banana Rash  ? Pineapple Rash  ? ? ?Review of Systems  ?Constitutional:  Negative for chills and fever.  ?HENT:  Negative for congestion, sinus pressure, sinus pain and sore throat.   ?Eyes:  Negative for pain and discharge.  ?Respiratory:  Negative for cough and wheezing.   ?Cardiovascular:  Negative for chest pain and palpitations.  ?Gastrointestinal:  Negative for abdominal pain, constipation, diarrhea, nausea and vomiting.  ?Endocrine: Negative for polydipsia and polyuria.  ?Genitourinary:  Positive for dysuria, flank pain and vaginal bleeding. Negative for hematuria.  ?Musculoskeletal:  Negative for neck pain and neck  stiffness.  ?     Leg pain  ?Skin:  Negative for rash.  ?Neurological:  Negative for dizziness and weakness.  ?Psychiatric/Behavioral:  Negative for agitation and behavioral problems.   ? ?   ?Objective:  ?  ?Physical Exam ?Vital

## 2021-08-17 NOTE — Assessment & Plan Note (Signed)
Noted in the last BMP, likely due to obstructive uropathy due to nephrolithiasis ?Check BMP ?

## 2021-08-17 NOTE — Assessment & Plan Note (Signed)
Has breast discharge chronically, went to Ob/Gyn recently ?Prolactin level was high in the past ?Will recheck serum prolactin level ?

## 2021-08-17 NOTE — Assessment & Plan Note (Signed)
Has left-sided flank pain with dysuria ?Recently had left nephrolithiasis, s/p ureteroscopy with stone extraction - followed by urology ?Check UA and urine culture ?Started Vantin after checking last urine culture -later switched to ciprofloxacin due to insurance coverage concern ? ?

## 2021-08-21 ENCOUNTER — Telehealth: Payer: Medicaid Other | Admitting: Physician Assistant

## 2021-08-21 DIAGNOSIS — B9689 Other specified bacterial agents as the cause of diseases classified elsewhere: Secondary | ICD-10-CM

## 2021-08-21 DIAGNOSIS — N760 Acute vaginitis: Secondary | ICD-10-CM | POA: Diagnosis not present

## 2021-08-22 ENCOUNTER — Telehealth: Payer: Self-pay | Admitting: *Deleted

## 2021-08-22 ENCOUNTER — Ambulatory Visit: Payer: Self-pay

## 2021-08-22 MED ORDER — METRONIDAZOLE 500 MG PO TABS: 500.0000 mg | ORAL_TABLET | Freq: Two times a day (BID) | ORAL | 0 refills | Status: DC

## 2021-08-22 NOTE — Progress Notes (Signed)
Message sent to patient requesting further input regarding current symptoms. Awaiting patient response.  

## 2021-08-22 NOTE — Progress Notes (Signed)
I have spent 5 minutes in review of e-visit questionnaire, review and updating patient chart, medical decision making and response to patient.   Ruthie Berch Cody Azelie Noguera, PA-C    

## 2021-08-22 NOTE — Progress Notes (Signed)

## 2021-08-22 NOTE — Patient Instructions (Signed)
Carrington Clamp Kamara ,  ? ?The [facility] Managed Care Team is available to provide assistance to you with your healthcare needs at no cost and as a benefit of your [facility] plan. I'm sorry I was unable to reach you today for our scheduled appointment. Our care guide will call you to reschedule our telephone appointment. Please call me at the number below. I am available to be of assistance to you regarding your healthcare needs. Marland Kitchen  ?I will reschedule your telephone appointment for Friday May 5th unless you return my call and request another day and or time. ?Thank you,  ? ?Kelli Churn RN, CCM, CDCES ?[facility] ?Care Management Coordinator - Managed Medicaid High Risk ?[phone]  ?

## 2021-08-22 NOTE — Patient Outreach (Signed)
Care Coordination ? ?08/22/2021 ? ?[name] ?March 22, 1981 ?[id number] ? ?08/22/2021 ?Name: [name] MRN: [id number] DOB: October 29, 1980 ? ?Referred by: [name], MD ?Reason for referral : High Risk Managed Medicaid (Unsuccessful RNCM follow up telephone outreach) ? ? ?An unsuccessful telephone outreach was attempted today. The patient was referred to the case management team for assistance with care management and care coordination.   ? ?Follow Up Plan: A HIPAA compliant phone message was left for the patient providing contact information and requesting a return call.  ? ? ?Kelli Churn RN, CCM, CDCES ?[facility] ?Care Management Coordinator - Managed Medicaid High Risk ?[phone]  ?

## 2021-08-29 ENCOUNTER — Telehealth: Payer: Medicaid Other | Admitting: Family Medicine

## 2021-08-29 DIAGNOSIS — B3731 Acute candidiasis of vulva and vagina: Secondary | ICD-10-CM

## 2021-08-29 MED ORDER — FLUCONAZOLE 150 MG PO TABS: 150.0000 mg | ORAL_TABLET | Freq: Once | ORAL | 0 refills | Status: AC

## 2021-08-29 NOTE — Progress Notes (Signed)

## 2021-09-01 ENCOUNTER — Other Ambulatory Visit: Payer: Self-pay | Admitting: *Deleted

## 2021-09-01 ENCOUNTER — Ambulatory Visit (HOSPITAL_COMMUNITY)
Admission: RE | Admit: 2021-09-01 | Discharge: 2021-09-01 | Disposition: A | Discharge status: Home / Self Care | Payer: Medicaid Other | Source: Ambulatory Visit | Attending: Urology | Admitting: Urology

## 2021-09-01 DIAGNOSIS — N200 Calculus of kidney: Secondary | ICD-10-CM | POA: Diagnosis not present

## 2021-09-01 DIAGNOSIS — Z0389 Encounter for observation for other suspected diseases and conditions ruled out: Secondary | ICD-10-CM | POA: Diagnosis not present

## 2021-09-02 NOTE — Patient Instructions (Signed)
Visit Information ? ?Ms. Creekmore was given information about Medicaid Managed Care team care coordination services as a part of their Healthy [facility] Medicaid benefit. Carrington Clamp Farnan verbally consented to engagement with the [facility] Managed Care team.  ? ?If you are experiencing a medical emergency, please call 911 or report to your local emergency department or urgent care.  ? ?If you have a non-emergency medical problem during routine business hours, please contact your provider's office and ask to speak with a nurse.  ? ?For questions related to your Healthy [facility] health plan, please call: [phone] or visit the homepage here: [email or web address] ? ?If you would like to schedule transportation through your Healthy [facility] plan, please call the following number at least 2 days in advance of your appointment: [phone] ? For information about your ride after you set it up, call Ride Assist at [phone]. Use this number to activate a Will Call pickup, or if your transportation is late for a scheduled pickup. Use this number, too, if you need to make a change or cancel a previously scheduled reservation. ? If you need transportation services right away, call [phone]. The after-hours call center is staffed 24 hours to handle ride assistance and urgent reservation requests (including discharges) 365 days a year. Urgent trips include sick visits, hospital discharge requests and life-sustaining treatment. ? ?Call the [place] at [phone], at any time, 24 hours a day, 7 days a week. If you are in danger or need immediate medical attention call 911. ? ?If you would like help to quit smoking, call 1-800-QUIT-NOW 905 876 9799) OR Espa?ol: 1-855-D?jelo-Ya 253-403-1007) o para m?s informaci?n haga clic aqu? or Text READY to 200-400 to register via text ? ?Ms. Buehrle - following are the goals we discussed in your visit  today:  ? Goals Addressed   ?Take medications as prescribed   ?Attend all scheduled provider appointments ?Call pharmacy for medication refills 3-7 days in advance of running out of medications ?Perform all self care activities independently  ?Perform IADL's (shopping, preparing meals, housekeeping, managing finances) independently ?Call provider office for new concerns or questions  ?Work with the Education officer, museum to address care coordination needs and will continue to work with the clinical team to address health care and disease management related needs ?check blood pressure weekly ?write blood pressure results in a log or diary ?learn about high blood pressure ?call doctor for signs and symptoms of high blood pressure ? ? ? ?Patient verbalizes understanding of instructions and care plan provided today and agrees to view in [place]. Active MyChart status confirmed with patient.   ? ?The Managed Medicaid care management team will reach out to the patient again over the next 45 days.  ? ?Kelli Churn RN, CCM, CDCES ?[facility] ?Care Management Coordinator - Managed Medicaid High Risk ?[phone]  ? ?Following is a copy of your plan of care:  ?Care Plan : ? [place]  ?Updates made by Barrington Ellison, RN since 09/02/2021 12:00 AM  ?  ? ?Problem: Knowledge Deficit and Care Coordination Needs Related to Management of  Asthma, HTN recurrent UTIs and kidney stones   ?Priority: High  ?  ? ?Long-Range Goal: Development of Plan Of Care to Address Care Coordination Needs and Knowledge Deficits for Management of asthma, HTN, recurrent UTIs and kidney stones   ?Start Date: 08/01/2021  ?Expected End Date: 08/02/2022  ?Priority: High  ?Note:   ?Current Barriers:  ?Knowledge  Deficits related to plan of care for management of HTN and asthma and recurrent UTIs with kidney stones  ?Care Coordination needs related to Financial constraints related to utility bills  ?Film/video editor.   ?Insomnia ?RNCM Clinical Goal(s):  ?Patient will verbalize understanding of plan for management of HTN, Asthma, and recurrent UTIs with kidney stones as evidenced by decreased incidences/reports of UTIs, no hospital admissions or ED visits related to complications of HTN, asthma or UTI/Kidney stones ?take all medications exactly as prescribed and will call provider for medication related questions as evidenced by patient reports of no missed medications    ?attend all scheduled medical appointments: with medical providers as evidenced by absence of no shows for medical appointments        ?demonstrate improved adherence to prescribed treatment plan for HTN, Asthma, and recurrent UTIs with kidney stones as evidenced by decreased incidences/reports of UTIs, no hospital admissions or ED related to complications of HTN, Asthma, UTIs or kidney stones ?continue to work with RN Care Manager and/or Social Worker to address care management and care coordination needs related to HTN, Asthma, and recurrent UTIs and kidney stones as evidenced by adherence to CM Team Scheduled appointments     ?work with community resource care guide to address needs related to Financial constraints related to paying utility bills as evidenced by patient and/or community resource care guide support    through collaboration with Consulting civil engineer, provider, and care team.  ? ?Interventions: ?Inter-disciplinary care team collaboration (see longitudinal plan of care) ?Evaluation of current treatment plan related to  self management and patient's adherence to plan as established by provider ?Advised patient role of RNCM will transition to another RNCM beginning in June 2023, telephone follow up appointment scheduled with [name] RNCM for 10/04/21 at 11:00 am ? ? 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, reports having difficulty managing household bills, say her Mom provides babysitting services for her 11 month old daughter when she is able to work, also says she is currently going through a divorce ?Collaborated with BSW to initiate plan of care to address needs related to Financial constraints related to payment of household bills  in patient with HTN, Asthma, and recurrent UTIs with kidney stones ?Collaboration with [name], MD regarding development and update of comprehensive plan of care as evidenced by provider attestation and co-signature ?Inter-disciplinary care team collaboration   ?Ensured patient spoke with Managed Medicaid BSW to address financial insecurity on 08/03/21, reviewed follow up telephone appointment with BSW on 09/04/21 at 11:00 am ?Ensured patient received household bills assistance/ resources, dental and ophthalmology resources from community care guide ? ?  ?Asthma: (Status:Goal on track:  Yes.) Long Term Goal-patient states pollen is a trigger, reports last asthma attack was in January and she was able to self treat with rescue inhaler ?Assessed asthma self management   ?Ensured patient received written educational information mailed to patient's home address and provided opportunity for questions ? ?Recurrent UTIs and Kidney Stones - s/p OP surgery for removal of kidney stone and ureteral stent on 3/30/223 (Status: Goal on track: NO.) Long Term Goal - patient states she continues to have dysuria and left flank pain that she treats with OTC Tylenol, says she is aware of bladder u/s  scheduled for today and that her bladder should be full, also aware of urology follow up appointment on 09/11/21  ?Evaluation of current treatment plan related to  recurrent UTIs and kidney stones ,  self-management and patient's adherence to plan as established by provider. ?Discussed plans with patient for ongoing care  management follow up and provided patient with direct contact information for care management team ?Ensured patient received  educational information re: prevention of UTIs and kidney stones mailed to pa

## 2021-09-02 NOTE — Patient Outreach (Signed)
?Medicaid Managed Care   ?Nurse Care Manager Note ? ?09/02/2021 ?Name:  [name] MRN:  [id number] DOB:  Feb 04, 1981 ? ?[name] is an 41 y.o. year old female who is a primary patient of [name], MD.  The [facility] Managed Care Coordination team was consulted for assistance with:    ?HTN ?Asthma ?Kidney stones ?Recurrent UTIs ?Obesity ? ?Ms. Pesnell was given information about Medicaid Managed Care Coordination team services today. Carrington Clamp [place] Patient agreed to services and verbal consent obtained. ? ?Engaged with patient by telephone for follow up visit in response to provider referral for case management and/or care coordination services.  ? ?Assessments/Interventions:  Review of past medical history, allergies, medications, health status, including review of consultants reports, laboratory and other test data, was performed as part of comprehensive evaluation and provision of chronic care management services. ? ?SDOH (Social Determinants of Health) assessments and interventions performed: ?SDOH Interventions   ? ?Flowsheet Row Most Recent Value  ?SDOH Interventions   ?Intimate Partner Violence Interventions Intervention Not Indicated  ?Stress Interventions Patient Refused  [patient states she is currently going through a divorce , offered the services of LCSW. patient states she does not feel she needs the services at this time]  ?Social Connections Interventions Intervention Not Indicated  ? ?  ? ? ?Care Plan ? ?Allergies  ?Allergen Reactions  ? Banana Rash  ? Pineapple Rash  ? ? ?Medications Reviewed Today   ? ? Reviewed by Barrington Ellison, RN (Registered Nurse) on 09/01/21 at 1112  Med List Status: <None>  ? ?Medication Order Taking? Sig Documenting Provider Last Dose Status Informant  ?acetaminophen (TYLENOL) 500 MG tablet [id number] No Take 1,000 mg by mouth every 6 (six) hours as needed for mild pain. [provider] Taking Active Self  ?albuterol  (PROVENTIL HFA) 108 (90 Base) MCG/ACT inhaler [id number] No Inhale 2 puffs into the lungs every 6 (six) hours as needed for wheezing (cough). [name], MD Taking Active Self  ?amLODipine (NORVASC) 5 MG tablet [id number] No Take 1 tablet (5 mg total) by mouth daily. [name], MD Taking Active   ?Blood Pressure Monitoring (BLOOD PRESSURE CUFF) MISC [id number] No Large cuff. ICD10: I10. Check blood pressure once daily. Please contact office if blood pressure above 150/90. [name], MD Taking Active   ?cyclobenzaprine (FLEXERIL) 5 MG tablet [id number] No Take 1 tablet (5 mg total) by mouth 2 (two) times daily as needed for muscle spasms. McKenzie, [name], MD Taking Active   ?EPINEPHrine 0.3 mg/0.3 mL IJ SOAJ injection [id number] No Inject 0.3 mg into the muscle as needed for anaphylaxis. Althea Charon, FNP Taking Active Self  ?         ?Med Note [name], Dionisio Aragones S   Tue Aug 01, 2021  3:01 PM) Uses prn for anaphylaxis to bananas and pineapple  ?fluticasone (FLONASE) 50 MCG/ACT nasal spray [id number] No Place 2 sprays into both nostrils daily as needed for allergies or rhinitis. [provider] Taking Active Self  ?fluticasone-salmeterol (ADVAIR HFA) 115-21 MCG/ACT inhaler [id number] No Inhale 2 puffs into the lungs 2 (two) times daily.  ?Patient taking differently: Inhale 2 puffs into the lungs daily.  ? [name], MD Taking Active Self  ?         ?Med Note [name], [name]   Tue Aug 01, 2021  3:02 PM) Teaching done with patient advising her to administer twice daily not once daily as she is currently  doing  ?hydrochlorothiazide (MICROZIDE) 12.5 MG capsule [id number] No Take 1 capsule (12.5 mg total) by mouth daily. TAKE (1) CAPSULE BY MOUTH ONCE EVERY DAY. ?Strength: 12.5 mg [name], MD Taking Active Self  ?metroNIDAZOLE (FLAGYL) 500 MG tablet [id number]  Take 1 tablet (500 mg total) by mouth 2 (two) times daily. Brunetta Jeans, PA-C  Active   ?mirabegron ER (MYRBETRIQ) 25 MG TB24  tablet [id number] No Take 1 tablet (25 mg total) by mouth daily. McKenzie, [name], MD Taking Active   ?Olopatadine HCl 0.2 % SOLN [id number] No Use 1 drop in each eye once a day as needed for itchy watery eyes  ?Patient taking differently: Place 1 drop into both eyes every 6 (six) hours as needed (itchy watery eyes).  ? Althea Charon, FNP Taking Active Self  ?ondansetron (ZOFRAN-ODT) 8 MG disintegrating tablet [id number] No Take 1 tablet (8 mg total) by mouth every 8 (eight) hours as needed for nausea or vomiting. McKenzie, [name], MD Taking Active   ?potassium chloride SA (KLOR-CON M) 20 MEQ tablet [id number] No Take 1 tablet (20 mEq total) by mouth daily. Barton Dubois, MD Taking Active Self  ?rOPINIRole (REQUIP) 1 MG tablet [id number] No Take 1 tablet (1 mg total) by mouth at bedtime. Barton Dubois, MD Taking Active Self  ?Patient not taking:  Discontinued 11/12/18 1700   ? ?  ?  ? ?  ? ? ?Patient Active Problem List  ? Diagnosis Date Noted  ? AKI (acute kidney injury) ([place]) 08/17/2021  ? Hospital discharge follow-up 07/14/2021  ? Pyelonephritis 06/28/2021  ? Hydronephrosis 06/28/2021  ? Fibroadenoma of breast, left 03/31/2021  ? Hyperprolactinemia ([place]) 02/10/2021  ? Restless legs 02/10/2021  ? Encounter for gynecological examination with Papanicolaou smear of cervix 02/07/2021  ? Hypokalemia 09/30/2020  ? Vitamin D deficiency 09/30/2020  ? Menorrhagia with irregular cycle 08/17/2020  ? Chronic right shoulder pain 07/01/2020  ? Mild persistent asthma, uncomplicated 68/34/1962  ? Abnormal uterine bleeding (AUB) 03/12/2017  ? Intramural leiomyoma of uterus 08/24/2015  ? Polyclonal gammopathy 07/26/2013  ? Dysplasia of cervix, unspecified 03/31/2013  ? Hypertension 09/21/2010  ? Morbid obesity ([place]) 11/03/2007  ? ? ? ?Care Plan : ? RN Care Manager Plan Of Care  ?Updates made by Barrington Ellison, RN since 09/02/2021 12:00 AM  ?  ? ?Problem: Knowledge Deficit and Care Coordination Needs Related to Management of   Asthma, HTN recurrent UTIs and kidney stones   ?Priority: High  ?  ? ?Long-Range Goal: Development of Plan Of Care to Address Care Coordination Needs and Knowledge Deficits for Management of asthma, HTN, recurrent UTIs and kidney stones   ?Start Date: 08/01/2021  ?Expected End Date: 08/02/2022  ?Priority: High  ?Note:   ?Current Barriers:  ?Knowledge Deficits related to plan of care for management of HTN and asthma and recurrent UTIs with kidney stones  ?Care Coordination needs related to Financial constraints related to utility bills  ?Film/video editor.  ?Insomnia ?RNCM Clinical Goal(s):  ?Patient will verbalize understanding of plan for management of HTN, Asthma, and recurrent UTIs with kidney stones as evidenced by decreased incidences/reports of UTIs, no hospital admissions or ED visits related to complications of HTN, asthma or UTI/Kidney stones ?take all medications exactly as prescribed and will call provider for medication related questions as evidenced by patient reports of no missed medications    ?attend all scheduled medical appointments: with medical providers as evidenced by absence of no shows for medical appointments        ?  demonstrate improved adherence to prescribed treatment plan for HTN, Asthma, and recurrent UTIs with kidney stones as evidenced by decreased incidences/reports of UTIs, no hospital admissions or ED related to complications of HTN, Asthma, UTIs or kidney stones ?continue to work with RN Care Manager and/or Social Worker to address care management and care coordination needs related to HTN, Asthma, and recurrent UTIs and kidney stones as evidenced by adherence to CM Team Scheduled appointments     ?work with community resource care guide to address needs related to Financial constraints related to paying utility bills as evidenced by patient and/or community resource care guide support    through collaboration with Consulting civil engineer, provider, and care team.   ? ?Interventions: ?Inter-disciplinary care team collaboration (see longitudinal plan of care) ?Evaluation of current treatment plan related to  self management and patient's adherence to plan as established by provider ?Advised patie

## 2021-09-04 ENCOUNTER — Other Ambulatory Visit: Payer: Self-pay

## 2021-09-04 NOTE — Patient Outreach (Signed)
?Medicaid Managed Care ?Social Work Note ? ?09/04/2021 ?Name:  [name] DOB:  04/26/1981 ? ?[name] is an 41 y.o. year old female who is a primary patient of [name], MD.  The Medicaid Managed Care Coordination team was consulted for assistance with:  Community Resources  ? ?Ms. Haun was given information about Medicaid Managed Care Coordination team services today. [name] Patient agreed to services and verbal consent obtained. ? ?Engaged with patient  for by telephone forfollow up visit in response to referral for case management and/or care coordination services.  ? ?Assessments/Interventions:  Review of past medical history, allergies, medications, health status, including review of consultants reports, laboratory and other test data, was performed as part of comprehensive evaluation and provision of chronic care management services. ? ?SDOH: (Social Determinant of Health) assessments and interventions performed: ?BSW completed follow up call with patient. She stated she had not tried the resources BSW sent to her, but she did speak with a CareGuide and was informed to asked BSW about the 360 referral, BSW provided information Careguide put in notes and provided patient with the information for [employer]. No other resources needed at this time.  ? ?Advanced Directives Status:  Not addressed in this encounter. ? ?Care Plan ?                ?Allergies  ?Allergen Reactions  ? Banana Rash  ? Pineapple Rash  ? ? ?Medications Reviewed Today   ? ? Reviewed by Barrington Ellison, RN (Registered Nurse) on 09/01/21 at 1112  Med List Status: <None>  ? ?Medication Order Taking? Sig Documenting Provider Last Dose Status Informant  ?acetaminophen (TYLENOL) 500 MG tablet [id number] No Take 1,000 mg by mouth every 6 (six) hours as needed for mild pain. [provider] Taking Active Self  ?albuterol (PROVENTIL HFA) 108 (90 Base) MCG/ACT inhaler  [id number] No Inhale 2 puffs into the lungs every 6 (six) hours as needed for wheezing (cough). [name], MD Taking Active Self  ?amLODipine (NORVASC) 5 MG tablet [id number] No Take 1 tablet (5 mg total) by mouth daily. [name], MD Taking Active   ?Blood Pressure Monitoring (BLOOD PRESSURE CUFF) MISC [id number] No Large cuff. ICD10: I10. Check blood pressure once daily. Please contact office if blood pressure above 150/90. [name], MD Taking Active   ?cyclobenzaprine (FLEXERIL) 5 MG tablet [id number] No Take 1 tablet (5 mg total) by mouth 2 (two) times daily as needed for muscle spasms. McKenzie, [name], MD Taking Active   ?EPINEPHrine 0.3 mg/0.3 mL IJ SOAJ injection [id number] No Inject 0.3 mg into the muscle as needed for anaphylaxis. Althea Charon, FNP Taking Active Self  ?         ?Med Note [name], JANET S   Tue Aug 01, 2021  3:01 PM) Uses prn for anaphylaxis to bananas and pineapple  ?fluticasone (FLONASE) 50 MCG/ACT nasal spray [id number] No Place 2 sprays into both nostrils daily as needed for allergies or rhinitis. [provider] Taking Active Self  ?fluticasone-salmeterol (ADVAIR HFA) 115-21 MCG/ACT inhaler [id number] No Inhale 2 puffs into the lungs 2 (two) times daily.  ?Patient taking differently: Inhale 2 puffs into the lungs daily.  ? [name], MD Taking Active Self  ?         ?Med Note [name], JANET S   Tue Aug 01, 2021  3:02 PM) Teaching done with patient advising her to administer twice  daily not once daily as she is currently doing  ?hydrochlorothiazide (MICROZIDE) 12.5 MG capsule [id number] No Take 1 capsule (12.5 mg total) by mouth daily. TAKE (1) CAPSULE BY MOUTH ONCE EVERY DAY. ?Strength: 12.5 mg [name], MD Taking Active Self  ?metroNIDAZOLE (FLAGYL) 500 MG tablet [id number]  Take 1 tablet (500 mg total) by mouth 2 (two) times daily. Brunetta Jeans, PA-C  Active   ?mirabegron ER (MYRBETRIQ) 25 MG TB24 tablet [id number] No Take 1 tablet (25 mg  total) by mouth daily. McKenzie, [name], MD Taking Active   ?Olopatadine HCl 0.2 % SOLN [id number] No Use 1 drop in each eye once a day as needed for itchy watery eyes  ?Patient taking differently: Place 1 drop into both eyes every 6 (six) hours as needed (itchy watery eyes).  ? Althea Charon, FNP Taking Active Self  ?ondansetron (ZOFRAN-ODT) 8 MG disintegrating tablet [id number] No Take 1 tablet (8 mg total) by mouth every 8 (eight) hours as needed for nausea or vomiting. McKenzie, [name], MD Taking Active   ?potassium chloride SA (KLOR-CON M) 20 MEQ tablet [id number] No Take 1 tablet (20 mEq total) by mouth daily. Barton Dubois, MD Taking Active Self  ?rOPINIRole (REQUIP) 1 MG tablet [id number] No Take 1 tablet (1 mg total) by mouth at bedtime. Barton Dubois, MD Taking Active Self  ?Patient not taking:  Discontinued 11/12/18 1700   ? ?  ?  ? ?  ? ? ?Patient Active Problem List  ? Diagnosis Date Noted  ? AKI (acute kidney injury) ([place]) 08/17/2021  ? Hospital discharge follow-up 07/14/2021  ? Pyelonephritis 06/28/2021  ? Hydronephrosis 06/28/2021  ? Fibroadenoma of breast, left 03/31/2021  ? Hyperprolactinemia ([place]) 02/10/2021  ? Restless legs 02/10/2021  ? Encounter for gynecological examination with Papanicolaou smear of cervix 02/07/2021  ? Hypokalemia 09/30/2020  ? Vitamin D deficiency 09/30/2020  ? Menorrhagia with irregular cycle 08/17/2020  ? Chronic right shoulder pain 07/01/2020  ? Mild persistent asthma, uncomplicated 70/78/6754  ? Abnormal uterine bleeding (AUB) 03/12/2017  ? Intramural leiomyoma of uterus 08/24/2015  ? Polyclonal gammopathy 07/26/2013  ? Dysplasia of cervix, unspecified 03/31/2013  ? Hypertension 09/21/2010  ? Morbid obesity ([place]) 11/03/2007  ? ? ?Conditions to be addressed/monitored per PCP order:   community resources ? ?There are no care plans that you recently modified to display for this patient. ? ? ?Follow up:  Patient agrees to Care Plan and Follow-up. ? ?Plan: The Managed  Medicaid care management team will reach out to the patient again over the next 30 days. ? ?Date/time of next scheduled Social Work care management/care coordination outreach:  10/05/21 ? ?[name], BSW, [place] ?[place]  ?High Risk Managed Medicaid Team  ?[phone]  ?

## 2021-09-04 NOTE — Patient Instructions (Signed)
Visit Information ? ?Ms. Esqueda was given information about Medicaid Managed Care team care coordination services as a part of their Healthy [facility] Medicaid benefit. Carrington Clamp Ellers verbally consented to engagement with the [facility] Managed Care team.  ? ?If you are experiencing a medical emergency, please call 911 or report to your local emergency department or urgent care.  ? ?If you have a non-emergency medical problem during routine business hours, please contact your provider's office and ask to speak with a nurse.  ? ?For questions related to your Healthy [facility] health plan, please call: [phone] or visit the homepage here: [email or web address] ? ?If you would like to schedule transportation through your Healthy [facility] plan, please call the following number at least 2 days in advance of your appointment: [phone] ? For information about your ride after you set it up, call Ride Assist at [phone]. Use this number to activate a Will Call pickup, or if your transportation is late for a scheduled pickup. Use this number, too, if you need to make a change or cancel a previously scheduled reservation. ? If you need transportation services right away, call [phone]. The after-hours call center is staffed 24 hours to handle ride assistance and urgent reservation requests (including discharges) 365 days a year. Urgent trips include sick visits, hospital discharge requests and life-sustaining treatment. ? ?Call the [place] at [phone], at any time, 24 hours a day, 7 days a week. If you are in danger or need immediate medical attention call 911. ? ?If you would like help to quit smoking, call 1-800-QUIT-NOW 470-682-2011) OR Espa?ol: 1-855-D?jelo-Ya (940)339-1170) o para m?s informaci?n haga clic aqu? or Text READY to 200-400 to register via text ? ?Ms. Mckeon - following are the goals we discussed in your visit  today:  ? Goals Addressed   ?None ?  ? ?Social Worker will follow up in 30 days .  ? ?[name], BSW, [place] ?[place]  ?High Risk Managed Medicaid Team  ?[phone]  ? ?Following is a copy of your plan of care:  ?There are no care plans that you recently modified to display for this patient. ? ?  ?

## 2021-09-11 ENCOUNTER — Ambulatory Visit: Payer: Medicaid Other | Admitting: Physician Assistant

## 2021-09-11 VITALS — BP 117/83 | HR 89 | Ht 65.0 in | Wt 280.0 lb

## 2021-09-11 DIAGNOSIS — N200 Calculus of kidney: Secondary | ICD-10-CM

## 2021-09-11 DIAGNOSIS — Z87442 Personal history of urinary calculi: Secondary | ICD-10-CM | POA: Diagnosis not present

## 2021-09-11 DIAGNOSIS — R109 Unspecified abdominal pain: Secondary | ICD-10-CM

## 2021-09-11 DIAGNOSIS — R3915 Urgency of urination: Secondary | ICD-10-CM | POA: Diagnosis not present

## 2021-09-11 DIAGNOSIS — N3281 Overactive bladder: Secondary | ICD-10-CM | POA: Diagnosis not present

## 2021-09-11 LAB — URINALYSIS, ROUTINE W REFLEX MICROSCOPIC
Bilirubin, UA: NEGATIVE
Glucose, UA: NEGATIVE
Ketones, UA: NEGATIVE
Leukocytes,UA: NEGATIVE
Nitrite, UA: NEGATIVE
Protein,UA: NEGATIVE
Specific Gravity, UA: 1.02 (ref 1.005–1.030)
Urobilinogen, Ur: 0.2 mg/dL (ref 0.2–1.0)
pH, UA: 7 (ref 5.0–7.5)

## 2021-09-11 LAB — MICROSCOPIC EXAMINATION
Renal Epithel, UA: NONE SEEN /hpf
WBC, UA: NONE SEEN /hpf (ref 0–5)

## 2021-09-11 LAB — BLADDER SCAN AMB NON-IMAGING: Scan Result: 0

## 2021-09-11 MED ORDER — GEMTESA 75 MG PO TABS: 75.0000 mg | ORAL_TABLET | Freq: Every day | ORAL | 0 refills | Status: DC

## 2021-09-11 NOTE — Progress Notes (Signed)
post void residual =0mL 

## 2021-09-11 NOTE — Progress Notes (Signed)
? ?Assessment: ?1. Left nephrolithiasis ?- Urinalysis, Routine w reflex microscopic ? ?2. OAB (overactive bladder) ?  ? ?Plan: ?Pt given samples of Gemtesa 75 mg #21.  If she finds these helpful, she will call and I am happy to send in a prescription for her.  Discussed renal ultrasound results and follow-up in 6 months after repeat study.  If she develops further symptoms of UTI, will follow-up for UA.   ? ?Chief Complaint: ?No chief complaint on file. ? ? ?HPI: ?[name] is a 41 y.o. female who presents for continued evaluation of left ureteral stone. She is post stone manip/extraction and removal of stent on 07/27/21. She had renal US last week which indicates no hydro/stone. She states she has had decrease in urine output over the weekend with some urgency and pressure as well as some right sided flank pain. No burning, gross hematuria. She denies fever, chills, NV.  She has been on Myrbetriq in the past for overactive bladder and stopped taking it because she did not feel it relieved her symptoms. ?UA=0-2RBC, few bact, nitrite negative ?PVR= 49m ? ?UKoreaindicates no evidence of hydro, stone, or obstruction. ? ?08/04/21 ?[name] is a 89yohere for followup for nephrolithiasis and back pain. She has been doing well since ureteroscopy and stent removal. She denies any worsening LUTS. No hematuria. She denies flank pain. No stone events since last visit. She has chronic low back pain that is worse with activity. No exacerbating/alleviating events.  ?  ?Portions of the above documentation were copied from a prior visit for review purposes only. ? ?Allergies: ?Allergies  ?Allergen Reactions  ? Banana Rash  ? Pineapple Rash  ? ? ?PMH: ?Past Medical History:  ?Diagnosis Date  ? Allergy   ? banana/pineapple  ? Anemia   ? post pregnancy  ? Anxiety   ? Asthma   ? Autoimmune disease, not elsewhere classified(279.49) 2013  ? Non specific- [facility], abnormal Bone Marrow signal  ? Breast lump in  female 11/28/2012  ? Has tender round mass at 10-11 0'clock right breast 4 finger breaths from [place] will [place] ? Breast mass, right 08/18/2012  ? Chlamydia   ? Chronic breast pain 05/31/2015  ? Dysmenorrhea 03/23/2014  ? Dyspareunia 01/05/2014  ? Elevated prolactin level 02/10/2021  ? Was 54.5, recheck in December ________  ? Fibroids, intramural 08/24/2015  ? GERD (gastroesophageal reflux disease)   ? Gonorrhea   ? Headache(784.0)   ? History of abnormal cervical Pap smear 04/02/2014  ? Hypertension   ? LLQ abdominal tenderness 05/31/2015  ? Menorrhagia 01/05/2014  ? Neuromuscular disorder ([place]   ? lower back and bilat leg pain  ? Obesity   ? Other and unspecified ovarian cyst 01/12/2014  ? Polyclonal gammopathy 07/26/2013  ? Insignificant  ? Recurrent upper respiratory infection (URI)   ? Scoliosis   ? Urticaria   ? Vaginal irritation 09/30/2014  ? Vaginal Pap smear, abnormal   ? ? ?PSH: ?Past Surgical History:  ?Procedure Laterality Date  ? ADENOIDECTOMY    ? BREAST BIOPSY Left 02/2021  ? CYSTOSCOPY W/ URETERAL STENT PLACEMENT Left 06/29/2021  ? Procedure: CYSTOSCOPY WITH RETROGRADE PYELOGRAM/URETERAL STENT PLACEMENT;  Surgeon: MCleon Gustin MD;  Location: [facility];  Service: Urology;  Laterality: Left;  ? CYSTOSCOPY WITH RETROGRADE PYELOGRAM, URETEROSCOPY AND STENT PLACEMENT Left 07/27/2021  ? Procedure: CYSTOSCOPY WITH RETROGRADE PYELOGRAM, URETEROSCOPY, STONE EXTRACTION WITH BASKET;  Surgeon: MCleon Gustin MD;  Location: [facility];  Service:  Urology;  Laterality: Left;  ? DILATATION AND CURETTAGE/HYSTEROSCOPY WITH MINERVA N/A 03/14/2021  ? Procedure: HYSTEROSCOPY WITH MINERVA;  Surgeon: Florian Buff, MD;  Location: [facility];  Service: Gynecology;  Laterality: N/A;  ? DILATION AND CURETTAGE OF UTERUS    ? LAPAROSCOPIC BILATERAL SALPINGECTOMY Bilateral 03/16/2020  ? Procedure: LAPAROSCOPIC BILATERAL SALPINGECTOMY;  Surgeon: Florian Buff, MD;  Location: [facility];  Service: Gynecology;  Laterality: Bilateral;  ? SHOULDER  SURGERY Right 03/10/2019  ? TONSILLECTOMY    ? TUBAL LIGATION N/A   ? Phreesia 06/28/2020  ? ? ?SH: ?Social History  ? ?Tobacco Use  ? Smoking status: Never  ? Smokeless tobacco: Never  ?Vaping Use  ? Vaping Use: Never used  ?Substance Use Topics  ? Alcohol use: No  ? Drug use: No  ? ? ?ROS: ?Constitutional:  Negative for fever, chills, weight loss ?CV: Negative for chest pain ?Respiratory:  Negative for shortness of breath, wheezing, sleep apnea, frequent cough ?GI:  Negative for nausea, vomiting, bloody stool, GERD ? ?PE: ?BP 117/83   Pulse 89   Ht '5\' 5"'$  (1.651 m)   Wt 280 lb (127 kg)   BMI 46.59 kg/m?  ?GENERAL APPEARANCE:  Well appearing, well developed, well nourished, NAD ?HEENT:  Atraumatic, normocephalic ?NECK:  Supple. Trachea midline ?ABDOMEN:  Soft, non-tender, no masses ?EXTREMITIES:  Moves all extremities well, without clubbing, cyanosis. Trace edema ?NEUROLOGIC:  Alert and oriented x 3, normal gait ?MENTAL STATUS:  appropriate ?BACK:  right-sided CVAT ?SKIN:  Warm, dry, and intact ? ? ?Results: ?Laboratory Data: ?Lab Results  ?Component Value Date  ? WBC 9.0 07/10/2021  ? HGB 12.4 07/10/2021  ? HCT 38.4 07/10/2021  ? MCV 91.4 07/10/2021  ? PLT 373 07/10/2021  ? ? ?Lab Results  ?Component Value Date  ? CREATININE 0.64 08/15/2021  ? ? ?Lab Results  ?Component Value Date  ? HGBA1C 5.4 09/28/2020  ? ? ?Urinalysis ?   ?Component Value Date/Time  ? COLORURINE AMBER (A) 07/10/2021 0936  ? APPEARANCEUR Clear 08/14/2021 1349  ? LABSPEC 1.020 07/10/2021 0936  ? PHURINE 7.0 07/10/2021 0936  ? GLUCOSEU Negative 08/14/2021 1349  ? HGBUR LARGE (A) 07/10/2021 0936  ? HGBUR small 02/03/2008 1438  ? BILIRUBINUR Negative 08/14/2021 1349  ? KETONESUR 5 (A) 07/10/2021 0936  ? PROTEINUR Negative 08/14/2021 1349  ? PROTEINUR >=300 (A) 07/10/2021 0936  ? UROBILINOGEN 1.0 01/01/2021 1149  ? UROBILINOGEN 1.0 12/29/2011 2254  ? NITRITE Negative 08/14/2021 1349  ? NITRITE NEGATIVE 07/10/2021 0936  ? LEUKOCYTESUR Negative  08/14/2021 1349  ? LEUKOCYTESUR SMALL (A) 07/10/2021 0936  ? ? ?Lab Results  ?Component Value Date  ? LABMICR Comment 08/14/2021  ? [place] None seen 08/14/2021  ? LABEPIT 0-10 08/14/2021  ? BACTERIA None seen 08/14/2021  ? ? ?Pertinent Imaging: ? ?No results found for this or any previous visit. ? ?No results found for this or any previous visit. ? ?No results found for this or any previous visit. ? ?No results found for this or any previous visit. ? ?Results for orders placed during the hospital encounter of 09/01/21 ? ?Ultrasound renal complete ? ?Narrative ?CLINICAL DATA:  Follow-up left nephrolithiasis ? ?EXAM: ?RENAL / URINARY TRACT ULTRASOUND COMPLETE ? ?COMPARISON:  Renal stone protocol 07/10/2021 ? ?FINDINGS: ?Right Kidney: ? ?Renal measurements: 13.5 x 3.9 x 4.8 cm = volume: 132 mL. ?Echogenicity within normal limits. No mass or hydronephrosis ?visualized. ? ?Left Kidney: ? ?Renal measurements: 12.5 x 5.1 x 5.2 cm = volume: 173  mL. ?Echogenicity within normal limits. No mass or hydronephrosis ?visualized. ? ?Bladder: ? ?Appears normal for degree of bladder distention. ? ?Other: ? ?None. ? ?IMPRESSION: ?No sonographic evidence of renal calculi or hydronephrosis. ? ? ?Electronically Signed ?By: Audie Pinto M.D. ?On: 09/02/2021 10:25 ? ?No results found for this or any previous visit. ? ?No results found for this or any previous visit. ? ?Results for orders placed during the hospital encounter of 07/10/21 ? ?CT Renal Stone Study ? ?Narrative ?CLINICAL DATA:  Left-sided flank pain and hematuria. ? ?EXAM: ?CT ABDOMEN AND PELVIS WITHOUT CONTRAST ? ?TECHNIQUE: ?Multidetector CT imaging of the abdomen and pelvis was performed ?following the standard protocol without IV contrast. ? ?RADIATION DOSE REDUCTION: This exam was performed according to the ?departmental dose-optimization program which includes automated ?exposure control, adjustment of the mA and/or kV according to ?patient size and/or use of iterative  reconstruction technique. ? ?COMPARISON:  CT abdomen pelvis dated June 28, 2021. ? ?FINDINGS: ?Lower chest: No acute abnormality. ? ?Hepatobiliary: No focal liver abnormality is seen. No gallstones, ?gallbladder

## 2021-09-13 ENCOUNTER — Other Ambulatory Visit (HOSPITAL_COMMUNITY): Payer: Self-pay | Admitting: Adult Health

## 2021-09-13 DIAGNOSIS — R928 Other abnormal and inconclusive findings on diagnostic imaging of breast: Secondary | ICD-10-CM

## 2021-09-14 ENCOUNTER — Other Ambulatory Visit: Payer: Self-pay | Admitting: *Deleted

## 2021-09-14 ENCOUNTER — Encounter: Payer: Self-pay | Admitting: Internal Medicine

## 2021-09-14 ENCOUNTER — Ambulatory Visit: Payer: Medicaid Other | Admitting: Physician Assistant

## 2021-09-14 ENCOUNTER — Ambulatory Visit: Payer: Medicaid Other | Admitting: Internal Medicine

## 2021-09-14 VITALS — BP 118/82 | HR 91 | Resp 18 | Ht 65.0 in | Wt 282.0 lb

## 2021-09-14 DIAGNOSIS — G43019 Migraine without aura, intractable, without status migrainosus: Secondary | ICD-10-CM | POA: Diagnosis not present

## 2021-09-14 DIAGNOSIS — N3941 Urge incontinence: Secondary | ICD-10-CM

## 2021-09-14 DIAGNOSIS — I10 Essential (primary) hypertension: Secondary | ICD-10-CM

## 2021-09-14 DIAGNOSIS — G2581 Restless legs syndrome: Secondary | ICD-10-CM

## 2021-09-14 DIAGNOSIS — J4530 Mild persistent asthma, uncomplicated: Secondary | ICD-10-CM

## 2021-09-14 MED ORDER — SUMATRIPTAN SUCCINATE 50 MG PO TABS: 50.0000 mg | ORAL_TABLET | ORAL | 2 refills | Status: DC | PRN

## 2021-09-14 MED ORDER — ROPINIROLE HCL 1 MG PO TABS: 1.0000 mg | ORAL_TABLET | Freq: Every day | ORAL | 1 refills | Status: DC

## 2021-09-14 NOTE — Assessment & Plan Note (Signed)
BP Readings from Last 1 Encounters:  09/14/21 118/82   Well-controlled with Amlodipine and HCTZ Counseled for compliance with the medications Advised DASH diet and moderate exercise/walking, at least 150 mins/week

## 2021-09-14 NOTE — Assessment & Plan Note (Signed)
Followed by urology [facility] [place]

## 2021-09-14 NOTE — Assessment & Plan Note (Signed)
Has been less active since delivery Plans to resume exercise DASH diet and moderate exercise/walking at least 150 mins/week

## 2021-09-14 NOTE — Patient Instructions (Signed)
Please take Sumatriptan as prescribed.  Please continue taking other medications as prescribed.

## 2021-09-14 NOTE — Progress Notes (Addendum)
Acute Office Visit  Subjective:    Patient ID: [name], female    DOB: January 27, 1981, 41 y.o.   MRN: [id number]  Chief Complaint  Patient presents with   Follow-up    4 week follow up pt has been having about 3 headaches per week for about 1.5 months     HPI Patient is in today for follow up of her urinary symptoms.  She was recently treated for acute pyelonephritis.  She denies any fever, chills, dysuria, hematuria or flank pain currently.  She had urinary urgency, for which Urology has started [place].  Migraine: She complains of intermittent headache, unilateral at times, lasting for few hours to a day and is associated with dizziness and nausea.  She denies any photophobia or phonophobia currently.  It improves with Tylenol and rest.  She has about 3 episodes of such headaches in a week.  HTN: BP is well-controlled. Takes medications regularly. Patient denies chest pain, dyspnea or palpitations.  Leg swelling: She complains of leg swelling, which is worse in the evening and improves in the morning.  She denies any dyspnea, orthopnea or PND.    Past Medical History:  Diagnosis Date   AKI (acute kidney injury) ([place]) 08/17/2021   Allergy    banana/pineapple   Anemia    post pregnancy   Anxiety    Asthma    Autoimmune disease, not elsewhere classified(279.49) 2013   Non specific- [facility], abnormal Bone Marrow signal   Breast lump in female 11/28/2012   Has tender round mass at 10-11 0'clock right breast 4 finger breaths from [place]ola will US   Breast mass, right 08/18/2012   Chlamydia    Chronic breast pain 05/31/2015   Dysmenorrhea 03/23/2014   Dyspareunia 01/05/2014   Elevated prolactin level 02/10/2021   Was 54.5, recheck in December ________   Fibroids, intramural 08/24/2015   GERD (gastroesophageal reflux disease)    Gonorrhea    Headache(784.0)    History of abnormal cervical Pap smear 04/02/2014   Hypertension    LLQ abdominal tenderness 05/31/2015    Menorrhagia 01/05/2014   Neuromuscular disorder ([place])    lower back and bilat leg pain   Obesity    Other and unspecified ovarian cyst 01/12/2014   Polyclonal gammopathy 07/26/2013   Insignificant   Pyelonephritis 06/28/2021   Recurrent upper respiratory infection (URI)    Scoliosis    Urticaria    Vaginal irritation 09/30/2014   Vaginal Pap smear, abnormal     Past Surgical History:  Procedure Laterality Date   ADENOIDECTOMY     BREAST BIOPSY Left 02/2021   CYSTOSCOPY W/ URETERAL STENT PLACEMENT Left 06/29/2021   Procedure: CYSTOSCOPY WITH RETROGRADE PYELOGRAM/URETERAL STENT PLACEMENT;  Surgeon: Cleon Gustin, MD;  Location: [facility];  Service: Urology;  Laterality: Left;   CYSTOSCOPY WITH RETROGRADE PYELOGRAM, URETEROSCOPY AND STENT PLACEMENT Left 07/27/2021   Procedure: CYSTOSCOPY WITH RETROGRADE PYELOGRAM, URETEROSCOPY, STONE EXTRACTION WITH BASKET;  Surgeon: Cleon Gustin, MD;  Location: [facility];  Service: Urology;  Laterality: Left;   DILATATION AND CURETTAGE/HYSTEROSCOPY WITH MINERVA N/A 03/14/2021   Procedure: HYSTEROSCOPY WITH MINERVA;  Surgeon: Florian Buff, MD;  Location: [facility];  Service: Gynecology;  Laterality: N/A;   DILATION AND CURETTAGE OF UTERUS     LAPAROSCOPIC BILATERAL SALPINGECTOMY Bilateral 03/16/2020   Procedure: LAPAROSCOPIC BILATERAL SALPINGECTOMY;  Surgeon: Florian Buff, MD;  Location: [facility];  Service: Gynecology;  Laterality: Bilateral;   SHOULDER SURGERY Right 03/10/2019   TONSILLECTOMY  TUBAL LIGATION N/A    Phreesia 06/28/2020    Family History  Problem Relation Age of Onset   Hypertension Mother    Diabetes Mother    Heart disease Mother    Diabetes Father    Cancer Paternal Aunt        breast   Hypertension Maternal Grandmother    Diabetes Son        pre diabetic   Other Son        overactive bladder   COPD Maternal Aunt    Leukemia Maternal Aunt    Allergic rhinitis Neg Hx    Angioedema Neg Hx    Asthma Neg Hx    Atopy Neg  Hx    Eczema Neg Hx    Immunodeficiency Neg Hx    Urticaria Neg Hx     Social History   Socioeconomic History   Marital status: Legally Separated    Spouse name: Jameel   Number of children: 2   Years of education: Not on file   Highest education level: Not on file  Occupational History   Not on file  Tobacco Use   Smoking status: Never   Smokeless tobacco: Never  Vaping Use   Vaping Use: Never used  Substance and Sexual Activity   Alcohol use: No   Drug use: No   Sexual activity: Not Currently    Birth control/protection: Surgical    Comment: tubal & ablation  Other Topics Concern   Not on file  Social History Narrative   Not on file   Social Determinants of Health   Financial Resource Strain: High Risk   Difficulty of Paying Living Expenses: Very hard  Food Insecurity: No Food Insecurity   Worried About Charity fundraiser in the Last Year: Never true   Ran Out of Food in the Last Year: Never true  Transportation Needs: No Transportation Needs   Lack of Transportation (Medical): No   Lack of Transportation (Non-Medical): No  Physical Activity: Not on file  Stress: Stress Concern Present   Feeling of Stress : Rather much  Social Connections: Unknown   Frequency of Communication with Friends and Family: More than three times a week   Frequency of Social Gatherings with Friends and Family: Three times a week   Attends Religious Services: 1 to 4 times per year   Active Member of Clubs or Organizations: No   Attends Archivist Meetings: Never   Marital Status: Not on file  Intimate Partner Violence: Not At Risk   Fear of Current or Ex-Partner: No   Emotionally Abused: No   Physically Abused: No   Sexually Abused: No    Outpatient Medications Prior to Visit  Medication Sig Dispense Refill   acetaminophen (TYLENOL) 500 MG tablet Take 1,000 mg by mouth every 6 (six) hours as needed for mild pain.     albuterol (PROVENTIL HFA) 108 (90 Base) MCG/ACT  inhaler Inhale 2 puffs into the lungs every 6 (six) hours as needed for wheezing (cough). 8 g 5   amLODipine (NORVASC) 5 MG tablet Take 1 tablet (5 mg total) by mouth daily. 90 tablet 0   Blood Pressure Monitoring (BLOOD PRESSURE CUFF) MISC Large cuff. ICD10: I10. Check blood pressure once daily. Please contact office if blood pressure above 150/90. 1 each 0   EPINEPHrine 0.3 mg/0.3 mL IJ SOAJ injection Inject 0.3 mg into the muscle as needed for anaphylaxis. 1 each 1   fluticasone (FLONASE) 50 MCG/ACT nasal spray  Place 2 sprays into both nostrils daily as needed for allergies or rhinitis.     fluticasone-salmeterol (ADVAIR HFA) 115-21 MCG/ACT inhaler Inhale 2 puffs into the lungs 2 (two) times daily. (Patient taking differently: Inhale 2 puffs into the lungs daily.) 1 each 12   hydrochlorothiazide (MICROZIDE) 12.5 MG capsule Take 1 capsule (12.5 mg total) by mouth daily. TAKE (1) CAPSULE BY MOUTH ONCE EVERY DAY. Strength: 12.5 mg 90 capsule 0   Olopatadine HCl 0.2 % SOLN Use 1 drop in each eye once a day as needed for itchy watery eyes (Patient taking differently: Place 1 drop into both eyes every 6 (six) hours as needed (itchy watery eyes).) 2.5 mL 3   ondansetron (ZOFRAN-ODT) 8 MG disintegrating tablet Take 1 tablet (8 mg total) by mouth every 8 (eight) hours as needed for nausea or vomiting. 30 tablet 0   potassium chloride SA (KLOR-CON M) 20 MEQ tablet Take 1 tablet (20 mEq total) by mouth daily. 30 tablet 0   Vibegron (GEMTESA) 75 MG TABS Take 75 mg by mouth daily. 21 tablet 0   rOPINIRole (REQUIP) 1 MG tablet Take 1 tablet (1 mg total) by mouth at bedtime. 30 tablet 1   cyclobenzaprine (FLEXERIL) 5 MG tablet Take 1 tablet (5 mg total) by mouth 2 (two) times daily as needed for muscle spasms. (Patient not taking: Reported on 09/14/2021) 30 tablet 1   No facility-administered medications prior to visit.    Allergies  Allergen Reactions   Banana Rash   Pineapple Rash    Review of Systems   Constitutional:  Negative for chills and fever.  HENT:  Negative for congestion, sinus pressure, sinus pain and sore throat.   Eyes:  Negative for pain and discharge.  Respiratory:  Negative for cough and wheezing.   Cardiovascular:  Positive for leg swelling. Negative for chest pain and palpitations.  Gastrointestinal:  Positive for nausea. Negative for abdominal pain, diarrhea and vomiting.  Endocrine: Negative for polydipsia and polyuria.  Genitourinary:  Negative for dysuria and hematuria.  Musculoskeletal:  Negative for neck pain and neck stiffness.       Leg pain  Skin:  Negative for rash.  Neurological:  Positive for dizziness and headaches. Negative for weakness.  Psychiatric/Behavioral:  Negative for agitation and behavioral problems.       Objective:    Physical Exam Vitals reviewed.  Constitutional:      General: She is not in acute distress.    Appearance: She is obese. She is not diaphoretic.  HENT:     Head: Normocephalic and atraumatic.     Nose: Nose normal. No congestion.     Mouth/Throat:     Mouth: Mucous membranes are moist.     Pharynx: No posterior oropharyngeal erythema.  Eyes:     General: No scleral icterus.    Extraocular Movements: Extraocular movements intact.  Cardiovascular:     Rate and Rhythm: Normal rate and regular rhythm.     Pulses: Normal pulses.     Heart sounds: Normal heart sounds. No murmur heard. Pulmonary:     Breath sounds: Normal breath sounds. No wheezing or rales.  Abdominal:     General: Bowel sounds are normal.     Palpations: Abdomen is soft.  Musculoskeletal:     Cervical back: Neck supple. No tenderness.     Right lower leg: No edema.     Left lower leg: No edema.  Skin:    General: Skin is warm.  Findings: No rash.  Neurological:     General: No focal deficit present.     Mental Status: She is alert and oriented to person, place, and time.     Sensory: No sensory deficit.     Motor: No weakness.   Psychiatric:        Mood and Affect: Mood normal.        Behavior: Behavior normal.    BP 118/82 (BP Location: Right Arm, Patient Position: Sitting, Cuff Size: Normal)   Pulse 91   Resp 18   Ht '5\' 5"'$  (1.651 m)   Wt 282 lb (127.9 kg)   SpO2 99%   BMI 46.93 kg/m  Wt Readings from Last 3 Encounters:  09/14/21 282 lb (127.9 kg)  09/11/21 280 lb (127 kg)  08/14/21 284 lb 6.4 oz (129 kg)        Assessment & Plan:   Problem List Items Addressed This Visit       Cardiovascular and Mediastinum   Hypertension    BP Readings from Last 1 Encounters:  09/14/21 118/82  Well-controlled with Amlodipine and HCTZ Counseled for compliance with the medications Advised DASH diet and moderate exercise/walking, at least 150 mins/week      Intractable migraine without aura and without status migrainosus - Primary    Her head headaches are likely migraine Started sumatriptan as needed for migraine Tylenol PRN for mild to moderate pain       Relevant Medications   SUMAtriptan (IMITREX) 50 MG tablet     Respiratory   Mild persistent asthma, uncomplicated    Better controlled with Advair and PRN Albuterol now         Other   Morbid obesity ([place])    Has been less active since delivery Plans to resume exercise DASH diet and moderate exercise/walking at least 150 mins/week       Restless legs    Well controlled with ropinirole       Urge incontinence    Followed by urology On Gemtesa         Meds ordered this encounter  Medications   SUMAtriptan (IMITREX) 50 MG tablet    Sig: Take 1 tablet (50 mg total) by mouth every 2 (two) hours as needed for migraine. May repeat in 2 hours if headache persists or recurs.    Dispense:  10 tablet    Refill:  2     Sheleen Conchas Keith Rake, MD

## 2021-09-14 NOTE — Assessment & Plan Note (Signed)
Better controlled with Advair and PRN Albuterol now 

## 2021-09-14 NOTE — Assessment & Plan Note (Signed)
Well controlled with ropinirole

## 2021-09-14 NOTE — Assessment & Plan Note (Signed)
Her head headaches are likely migraine Started sumatriptan as needed for migraine Tylenol PRN for mild to moderate pain

## 2021-10-03 ENCOUNTER — Other Ambulatory Visit: Payer: Self-pay

## 2021-10-05 ENCOUNTER — Other Ambulatory Visit: Payer: Self-pay

## 2021-10-05 NOTE — Patient Outreach (Signed)
Medicaid Managed Care Social Work Note  10/05/2021 Name:  [name] MRN:  [id number] DOB:  July 21, 1980  [name] is an 41 y.o. year old female who is a primary patient of [name], MD.  The Medicaid Managed Care Coordination team was consulted for assistance with:  Community Resources   [name] was given information about Medicaid Managed Care Coordination team services today. [name] Patient agreed to services and verbal consent obtained.  Engaged with patient  for by telephone forfollow up visit in response to referral for case management and/or care coordination services.   Assessments/Interventions:  Review of past medical history, allergies, medications, health status, including review of consultants reports, laboratory and other test data, was performed as part of comprehensive evaluation and provision of chronic care management services.  SDOH: (Social Determinant of Health) assessments and interventions performed: BSW completed telephone outreach with patient. She stated [employer] informed that they could not help her last month but could this month. Patient stated she was able to get it paid this month. No other resources are needed at this time.   Advanced Directives Status:  Not addressed in this encounter.  Care Plan                 Allergies  Allergen Reactions   Banana Rash   Pineapple Rash    Medications Reviewed Today     Reviewed by [name], MD (Physician) on 09/14/21 at 1359  Med List Status: <None>   Medication Order Taking? Sig Documenting Provider Last Dose Status Informant  acetaminophen (TYLENOL) 500 MG tablet [id number] Yes Take 1,000 mg by mouth every 6 (six) hours as needed for mild pain. [provider] Taking Active Self  albuterol (PROVENTIL HFA) 108 (90 Base) MCG/ACT inhaler [id number] Yes Inhale 2 puffs into the lungs every 6 (six) hours as needed for wheezing (cough). [name], MD Taking Active Self  amLODipine (NORVASC) 5 MG tablet [id number] Yes Take 1 tablet (5 mg total) by mouth daily. [name], MD Taking Active   Blood Pressure Monitoring (BLOOD PRESSURE CUFF) MISC [id number] Yes Large cuff. ICD10: I10. Check blood pressure once daily. Please contact office if blood pressure above 150/90. [name], MD Taking Active   EPINEPHrine 0.3 mg/0.3 mL IJ SOAJ injection [id number] Yes Inject 0.3 mg into the muscle as needed for anaphylaxis. 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 [provider] Taking Active Self  fluticasone-salmeterol (ADVAIR HFA) 093-26 MCG/ACT inhaler [id number] Yes Inhale 2 puffs into the lungs 2 (two) times daily.  Patient taking differently: Inhale 2 puffs into the lungs daily.   [name], MD Taking Active Self           Med Note [name], Renne Musca Aug 01, 2021  3:02 PM) Teaching done with patient advising her to administer twice daily not once daily as she is currently doing  hydrochlorothiazide (MICROZIDE) 12.5 MG capsule [id number] Yes Take 1 capsule (12.5 mg total) by mouth daily. TAKE (1) CAPSULE BY MOUTH ONCE EVERY DAY. Strength: 12.5 mg [name], MD Taking Active Self  Olopatadine HCl 0.2 % SOLN  [id number] Yes Use 1 drop in each eye once a day as needed for itchy watery eyes  Patient taking differently: Place 1 drop into both eyes every 6 (six) hours as needed (itchy watery eyes).   Althea Charon, FNP Taking Active Self  ondansetron (ZOFRAN-ODT) 8 MG disintegrating tablet [id number] Yes Take 1 tablet (8 mg total) by mouth every 8 (eight) hours as needed for nausea or vomiting. McKenzie, [name], MD Taking Active   potassium chloride SA (KLOR-CON M) 20  MEQ tablet [id number] Yes Take 1 tablet (20 mEq total) by mouth daily. Barton Dubois, MD Taking Active Self  rOPINIRole (REQUIP) 1 MG tablet [id number]  Take 1 tablet (1 mg total) by mouth at bedtime. [name], MD  Active   SUMAtriptan (IMITREX) 50 MG tablet [id number] Yes Take 1 tablet (50 mg total) by mouth every 2 (two) hours as needed for migraine. May repeat in 2 hours if headache persists or recurs. [name], MD  Active   Vibegron [facility]) 75 MG TABS [id number] Yes Take 75 mg by mouth daily. [place], Berneice Heinrich, [place] Taking Active    Patient not taking:   Discontinued 11/12/18 1700             Patient Active Problem List   Diagnosis Date Noted   Intractable migraine without aura and without status migrainosus 09/14/2021   Urge incontinence 09/14/2021   Hospital discharge follow-up 07/14/2021   Hydronephrosis 06/28/2021   Fibroadenoma of breast, left 03/31/2021   Restless legs 02/10/2021   Encounter for gynecological examination with Papanicolaou smear of cervix 02/07/2021   Vitamin D deficiency 09/30/2020   Menorrhagia with irregular cycle 08/17/2020   Chronic right shoulder pain 07/01/2020   Mild persistent asthma, uncomplicated 25/42/7062   Abnormal uterine bleeding (AUB) 03/12/2017   Intramural leiomyoma of uterus 08/24/2015   Polyclonal gammopathy 07/26/2013   Dysplasia of cervix, unspecified 03/31/2013   Hypertension 09/21/2010   Morbid obesity ([place]) 11/03/2007    Conditions to be addressed/monitored per PCP order:   community resources  There are no care plans that you recently modified to display for this patient.   Follow up:  Patient agrees to Care Plan and Follow-up.  Plan: The  Patient has been provided with contact information for the Managed Medicaid care management team and has been advised to call with any health related questions or concerns.    [name], BSW, [place] Managed  Medicaid Team  [phone]

## 2021-10-05 NOTE — Patient Instructions (Signed)
Visit Information  [name] was given information about Medicaid Managed Care team care coordination services as a part of their Healthy [facility] Medicaid benefit. [name] verbally consented to engagement with the [facility] Managed Care team.   If you are experiencing a medical emergency, please call 911 or report to your local emergency department or urgent care.   If you have a non-emergency medical problem during routine business hours, please contact your provider's office and ask to speak with a nurse.   For questions related to your Healthy [facility] health plan, please call: [phone] or visit the homepage here: [email or web address]  If you would like to schedule transportation through your Healthy [facility] plan, please call the following number at least 2 days in advance of your appointment: [phone]  For information about your ride after you set it up, call Ride Assist at [phone]. Use this number to activate a Will Call pickup, or if your transportation is late for a scheduled pickup. Use this number, too, if you need to make a change or cancel a previously scheduled reservation.  If you need transportation services right away, call [phone]. The after-hours call center is staffed 24 hours to handle ride assistance and urgent reservation requests (including discharges) 365 days a year. Urgent trips include sick visits, hospital discharge requests and life-sustaining treatment.  Call the [place] at [phone], at any time, 24 hours a day, 7 days a week. If you are in danger or need immediate medical attention call 911.  If you would like help to quit smoking, call 1-800-QUIT-NOW 548-698-6774) OR Espaol: 1-855-Djelo-Ya (3-833-383-2919) o para ms informacin haga clic aqu or Text READY to 200-400 to register via text  [name] - following are the goals we discussed in your visit  today:   Goals Addressed   None       The  Patient                                              has been provided with contact information for the Managed Medicaid care management team and has been advised to call with any health related questions or concerns.   [name], BSW, [place] Managed Medicaid Team  [phone]   Following is a copy of your plan of care:  There are no care plans that you recently modified to display for this patient.

## 2021-10-06 ENCOUNTER — Other Ambulatory Visit: Payer: Self-pay | Admitting: *Deleted

## 2021-10-06 NOTE — Patient Instructions (Signed)
Visit Information  [name] was given information about Medicaid Managed Care team care coordination services as a part of their Healthy [facility] Medicaid benefit. [name] verbally consented to engagement with the [facility] Managed Care team.   If you are experiencing a medical emergency, please call 911 or report to your local emergency department or urgent care.   If you have a non-emergency medical problem during routine business hours, please contact your provider's office and ask to speak with a nurse.   For questions related to your Healthy [facility] health plan, please call: [phone] or visit the homepage here: [email or web address]  If you would like to schedule transportation through your Healthy [facility] plan, please call the following number at least 2 days in advance of your appointment: [phone]  For information about your ride after you set it up, call Ride Assist at [phone]. Use this number to activate a Will Call pickup, or if your transportation is late for a scheduled pickup. Use this number, too, if you need to make a change or cancel a previously scheduled reservation.  If you need transportation services right away, call [phone]. The after-hours call center is staffed 24 hours to handle ride assistance and urgent reservation requests (including discharges) 365 days a year. Urgent trips include sick visits, hospital discharge requests and life-sustaining treatment.  Call the [place] at [phone], at any time, 24 hours a day, 7 days a week. If you are in danger or need immediate medical attention call 911.  If you would like help to quit smoking, call 1-800-QUIT-NOW (947)840-7368) OR Espaol: 1-855-Djelo-Ya (3-546-568-1275) o para ms informacin haga clic aqu or Text READY to 200-400 to register via text  [name],   Please see education materials related to  asthma provided by MyChart link.  Patient verbalizes understanding of instructions and care plan provided today and agrees to view in [place]. Active MyChart status and patient understanding of how to access instructions and care plan via MyChart confirmed with patient.     Telephone follow up appointment with Managed Medicaid care management team member scheduled for:12/11/21 @ 10:30am  [name] RN, BSN [place] RN Care Coordinator   Following is a copy of your plan of care:  Care Plan : " [place]  Updates made by [name], RN since 10/06/2021 12:00 AM     Problem: Knowledge Deficit and Care Coordination Needs Related to Management of  Asthma, HTN recurrent UTIs and kidney stones   Priority: High     Long-Range Goal: Development of Plan Of Care to Address Care Coordination Needs and Knowledge Deficits for Management of asthma, HTN, recurrent UTIs and kidney stones   Start Date: 08/01/2021  Expected End Date: 08/02/2022  Priority: High  Note:   Current Barriers:  Knowledge Deficits related to plan of care for management of HTN and asthma and recurrent UTIs with kidney stones  Care Coordination needs related to Financial constraints related to utility bills  Film/video editor.  Insomnia RNCM Clinical Goal(s):  Patient will verbalize understanding of plan for management of HTN, Asthma, and recurrent UTIs with kidney stones as evidenced by decreased incidences/reports of UTIs, no hospital admissions or ED visits related to complications of HTN, asthma or UTI/Kidney stones take all medications exactly as prescribed and will call provider for medication related questions as evidenced by patient reports of no missed medications    attend all scheduled medical appointments:  with medical providers as evidenced by absence of no shows for medical appointments        demonstrate improved adherence to prescribed treatment plan for HTN,  Asthma, and recurrent UTIs with kidney stones as evidenced by decreased incidences/reports of UTIs, no hospital admissions or ED related to complications of HTN, Asthma, UTIs or kidney stones continue to work with RN Care Manager and/or Social Worker to address care management and care coordination needs related to HTN, Asthma, and recurrent UTIs and kidney stones as evidenced by adherence to CM Team Scheduled appointments     work with [name] care guide to address needs related to Financial constraints related to paying utility bills as evidenced by patient and/or community resource care guide support    through collaboration with Consulting civil engineer, provider, and care team.   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, reports having difficulty managing household bills, say her Mom provides babysitting services for her 39 month old daughter when she is able to work, also says she is currently going through a divorce Collaborated with BSW to initiate plan of care to address needs related to Financial constraints related to payment of household bills  in patient with HTN, Asthma, and recurrent UTIs with kidney stones Collaboration with [name], MD regarding development and update of comprehensive plan of care as evidenced by provider attestation and  co-signature Inter-disciplinary care team collaboration   Ensured patient spoke with Managed Medicaid BSW to address financial insecurity on 08/03/21, reviewed follow up telephone appointment with BSW on 09/04/21 at 11:00 am Ensured patient received household bills assistance/ resources, dental and ophthalmology resources from community care guide    Asthma: (Status:Goal on track:  Yes.) Long Term Goal-Recent asthma attack due to the heat Advised patient to track and manage Asthma triggers Provided instruction about proper use of medications used for management of Asthma including inhalers Assessed social determinant of health barriers   Ensured patient received written educational information mailed to patient's home address and provided opportunity for questions  Recurrent UTIs and Kidney Stones - s/p OP surgery for removal of kidney stone and ureteral stent on 3/30/223 (Status: Goal on Track (progressing): YES.) Long Term Goal - patient states she continues to have dysuria and left flank pain that she treats with OTC Tylenol, Urology follow up and ultrasound need scheduling  Evaluation of current treatment plan related to  recurrent UTIs and kidney stones ,  self-management and patient's adherence to plan as established by provider. Discussed plans with patient for ongoing care management follow up and provided patient with direct contact information for care management team  Hypertension: (Status: Goal on Track (progressing): YES.) Long Term Goal - Patient checking BP every other day, reading yesterday 120/79  Last practice recorded BP readings:  BP Readings from Last 3 Encounters:  09/14/21 118/82  09/11/21 117/83  08/14/21 134/88    Most recent eGFR/CrCl:  Lab Results  Component Value Date   EGFR 116 08/15/2021    No components found for: "CRCL"  Evaluation of current treatment plan related to hypertension self management and patient's adherence to plan as established by provider;    Reviewed medications with patient and discussed importance of compliance;  Discussed plans with patient for ongoing care management follow up and provided patient with direct contact information for care management team; Advised patient, providing education and rationale, to monitor blood pressure daily and record, calling PCP for findings outside established parameters; reviewed BP treatment targets and encouraged patient to bring home BP readings to all primary care provider appointments    Patient Goals/Self-Care Activities: Take medications as prescribed   Attend all scheduled provider appointments Call pharmacy for medication refills 3-7 days in advance of running out of medications Perform all self care activities independently  Perform IADL's (shopping, preparing meals, housekeeping, managing finances) independently Call provider office for new concerns or questions  Work with the social worker to address care coordination needs and will continue to work with the clinical team to address health care and disease management related needs check blood pressure weekly write blood pressure results in a log or diary learn about high blood pressure call doctor for signs and symptoms of high blood pressure

## 2021-10-06 NOTE — Patient Outreach (Signed)
Medicaid Managed Care   Nurse Care Manager Note  10/06/2021 Name:  [name] MRN:  [id number] DOB:  Apr 30, 1981  [name] is an 41 y.o. year old female who is a primary patient of [name], MD.  The [facility] Managed Care Coordination team was consulted for assistance with:    HTN asthma Frequent UTI with kidney stones  [name] was given information about Medicaid Managed Care Coordination team services today. [name] Patient agreed to services and verbal consent obtained.  Engaged with patient by telephone for follow up visit in response to provider referral for case management and/or care coordination services.   Assessments/Interventions:  Review of past medical history, allergies, medications, health status, including review of consultants reports, laboratory and other test data, was performed as part of comprehensive evaluation and provision of chronic care management services.  SDOH (Social Determinants of Health) assessments and interventions performed: SDOH Interventions    Flowsheet Row Most Recent Value  SDOH Interventions   Housing Interventions Intervention Not Indicated  Transportation Interventions Intervention Not Indicated       Care Plan  Allergies  Allergen Reactions   Banana Rash   Pineapple Rash    Medications Reviewed Today     Reviewed by [name], RN (Registered Nurse) on 10/06/21 at 1049  Med List Status: <None>   Medication Order Taking? Sig Documenting Provider Last Dose Status Informant  acetaminophen (TYLENOL) 500 MG tablet [id number] Yes Take 1,000 mg by mouth every 6 (six) hours as needed for mild pain. [provider] Taking Active Self  albuterol (PROVENTIL HFA) 108 (90 Base) MCG/ACT inhaler [id number] Yes Inhale 2 puffs into the lungs every 6 (six) hours as needed for wheezing (cough). [name], MD Taking Active Self  amLODipine (NORVASC) 5 MG tablet [id number] Yes Take  1 tablet (5 mg total) by mouth daily. [name], MD Taking Active   Blood Pressure Monitoring (BLOOD PRESSURE CUFF) MISC [id number] Yes Large cuff. ICD10: I10. Check blood pressure once daily. Please contact office if blood pressure above 150/90. [name], MD Taking Active   EPINEPHrine 0.3 mg/0.3 mL IJ SOAJ injection [id number] Yes Inject 0.3 mg into the muscle as needed for anaphylaxis. 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 [provider] Taking Active Self  fluticasone-salmeterol (ADVAIR HFA) 761-60 MCG/ACT inhaler [id number] Yes Inhale 2 puffs into the lungs 2 (two) times daily.  Patient taking differently: Inhale 2 puffs into the lungs daily.   [name], MD Taking Active Self           Med Note [name], Renne Musca Aug 01, 2021  3:02 PM) Teaching done with patient advising her to administer twice daily not once daily as she is currently doing  hydrochlorothiazide (MICROZIDE) 12.5 MG capsule [id number] Yes Take 1 capsule (12.5 mg total) by mouth daily. TAKE (1) CAPSULE BY MOUTH ONCE EVERY DAY. Strength: 12.5 mg [name], MD Taking Active Self  Olopatadine HCl 0.2 % SOLN [id number] No Use 1 drop in each eye once a day as needed for itchy watery eyes  Patient not taking: Reported on 10/06/2021   Quita Skye,  Altha Harm, FNP Not Taking Active Self  ondansetron (ZOFRAN-ODT) 8 MG disintegrating tablet [id number] Yes Take 1 tablet (8 mg total) by mouth every 8 (eight) hours as needed for nausea or vomiting. McKenzie, [name], MD Taking Active   potassium chloride SA (KLOR-CON M) 20 MEQ tablet [id number] Yes Take 1 tablet (20 mEq total) by mouth daily. Barton Dubois, MD Taking Active Self  rOPINIRole (REQUIP) 1 MG tablet  [id number] Yes Take 1 tablet (1 mg total) by mouth at bedtime. [name], MD Taking Active   SUMAtriptan (IMITREX) 50 MG tablet [id number] Yes Take 1 tablet (50 mg total) by mouth every 2 (two) hours as needed for migraine. May repeat in 2 hours if headache persists or recurs. [name], MD Taking Active   Vibegron [facility]) 75 MG TABS [id number] Yes Take 75 mg by mouth daily. [place], Berneice Heinrich, [place] Taking Active    Patient not taking:   Discontinued 11/12/18 1700             Patient Active Problem List   Diagnosis Date Noted   Intractable migraine without aura and without status migrainosus 09/14/2021   Urge incontinence 09/14/2021   Hospital discharge follow-up 07/14/2021   Hydronephrosis 06/28/2021   Fibroadenoma of breast, left 03/31/2021   Restless legs 02/10/2021   Encounter for gynecological examination with Papanicolaou smear of cervix 02/07/2021   Vitamin D deficiency 09/30/2020   Menorrhagia with irregular cycle 08/17/2020   Chronic right shoulder pain 07/01/2020   Mild persistent asthma, uncomplicated 03/50/0938   Abnormal uterine bleeding (AUB) 03/12/2017   Intramural leiomyoma of uterus 08/24/2015   Polyclonal gammopathy 07/26/2013   Dysplasia of cervix, unspecified 03/31/2013   Hypertension 09/21/2010   Morbid obesity ([place]) 11/03/2007    Conditions to be addressed/monitored per PCP order:  HTN, Asthma, and frequent UTI with kidney stone  Care Plan : " RN Care Manager Plan Of Care  Updates made by [name], RN since 10/06/2021 12:00 AM     Problem: Knowledge Deficit and Care Coordination Needs Related to Management of  Asthma, HTN recurrent UTIs and kidney stones   Priority: High     Long-Range Goal: Development of Plan Of Care to Address Care Coordination Needs and Knowledge Deficits for Management of asthma, HTN, recurrent UTIs and kidney stones   Start Date: 08/01/2021  Expected End Date: 08/02/2022  Priority: High  Note:   Current  Barriers:  Knowledge Deficits related to plan of care for management of HTN and asthma and recurrent UTIs with kidney stones  Care Coordination needs related to Financial constraints related to utility bills  Financial Constraints.  Insomnia RNCM Clinical Goal(s):  Patient will verbalize understanding of plan for management of HTN, Asthma, and recurrent UTIs with kidney stones as evidenced by decreased incidences/reports of UTIs, no hospital admissions or ED visits related to complications of HTN, asthma or UTI/Kidney stones take all medications exactly as prescribed and will call provider for medication related questions as evidenced by patient reports of no missed medications    attend all scheduled medical appointments: with medical providers as evidenced by absence of no shows for medical appointments        demonstrate improved adherence to prescribed treatment plan for HTN, Asthma, and recurrent UTIs with kidney stones as evidenced by decreased incidences/reports of UTIs, no hospital admissions or ED related to complications of HTN, Asthma, UTIs or kidney stones continue to work with Consulting civil engineer and/or Social Worker to address care management and  care coordination needs related to HTN, Asthma, and recurrent UTIs and kidney stones as evidenced by adherence to CM Team Scheduled appointments     work with [name] care guide to address needs related to Financial constraints related to paying utility bills as evidenced by patient and/or community resource care guide support    through collaboration with Consulting civil engineer, provider, and care team.   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, reports having difficulty managing household bills, say her Mom provides babysitting services for her 64 month old daughter when she is able to work, also says she is currently going through a divorce Collaborated with BSW to initiate plan of care to address needs related to Financial constraints related to payment of household bills  in patient with HTN, Asthma, and recurrent UTIs with kidney stones Collaboration with [name], MD regarding development and update of comprehensive plan of care as evidenced by provider attestation and co-signature Inter-disciplinary care team collaboration   Ensured patient spoke with Managed Medicaid BSW to address financial insecurity on 08/03/21, reviewed follow up telephone appointment with BSW on 09/04/21 at 11:00 am Ensured patient received household bills assistance/ resources, dental and ophthalmology resources from community care guide    Asthma: (Status:Goal on track:  Yes.) Long Term Goal-Recent asthma attack due to the heat Advised patient to track and manage Asthma triggers Provided instruction about proper use of medications used for management of Asthma including inhalers Assessed social determinant of health barriers   Ensured patient received written educational information mailed to patient's home address and provided opportunity for questions  Recurrent UTIs and Kidney Stones - s/p OP surgery for removal of kidney stone and ureteral stent on 3/30/223 (Status: Goal on Track (progressing): YES.) Long Term Goal - patient states she continues to have dysuria and left flank pain that she treats with OTC Tylenol,  Urology follow up and ultrasound need scheduling  Evaluation of current treatment plan related to  recurrent UTIs and kidney stones ,  self-management and patient's adherence to plan as established by provider. Discussed plans with patient for ongoing care management follow up and provided patient with direct contact information for care management team  Hypertension: (Status: Goal on Track (progressing): YES.) Long Term Goal - Patient checking BP every other day, reading yesterday 120/79  Last practice recorded BP readings:  BP Readings from Last 3 Encounters:  09/14/21 118/82  09/11/21 117/83  08/14/21 134/88    Most recent eGFR/CrCl:  Lab Results  Component Value Date   EGFR 116 08/15/2021    No components found for: "CRCL"  Evaluation of current treatment plan related to hypertension self management and patient's adherence to plan as established by provider;   Reviewed medications with patient and discussed importance of compliance;  Discussed plans with patient for ongoing care management follow up and provided patient with direct contact information for care management team;  Advised patient, providing education and rationale, to monitor blood pressure daily and record, calling PCP for findings outside established parameters; reviewed BP treatment targets and encouraged patient to bring home BP readings to all primary care provider appointments    Patient Goals/Self-Care Activities: Take medications as prescribed   Attend all scheduled provider appointments Call pharmacy for medication refills 3-7 days in advance of running out of medications Perform all self care activities independently  Perform IADL's (shopping, preparing meals, housekeeping, managing finances) independently Call provider office for new concerns or questions  Work with the social worker to address care coordination needs and will continue to work with the clinical team to address health care and disease  management related needs check blood pressure weekly write blood pressure results in a log or diary learn about high blood pressure call doctor for signs and symptoms of high blood pressure        Follow Up:  Patient agrees to Care Plan and Follow-up.  Plan: The Managed Medicaid care management team will reach out to the patient again over the next 60 days.  Date/time of next scheduled RN care management/care coordination outreach:  12/11/21 @ 10:30am  Mary Joiner RN, BSN [place] RN Care Coordinator

## 2021-10-17 ENCOUNTER — Encounter (HOSPITAL_COMMUNITY): Payer: Self-pay

## 2021-10-17 ENCOUNTER — Ambulatory Visit (HOSPITAL_COMMUNITY)
Admission: RE | Admit: 2021-10-17 | Discharge: 2021-10-17 | Disposition: A | Discharge status: Home / Self Care | Payer: Medicaid Other | Source: Ambulatory Visit | Attending: Adult Health | Admitting: Adult Health

## 2021-10-17 DIAGNOSIS — N6489 Other specified disorders of breast: Secondary | ICD-10-CM | POA: Diagnosis not present

## 2021-10-17 DIAGNOSIS — R928 Other abnormal and inconclusive findings on diagnostic imaging of breast: Secondary | ICD-10-CM

## 2021-10-26 ENCOUNTER — Telehealth: Payer: Medicaid Other | Admitting: Physician Assistant

## 2021-10-26 DIAGNOSIS — N760 Acute vaginitis: Secondary | ICD-10-CM | POA: Diagnosis not present

## 2021-10-26 DIAGNOSIS — B9689 Other specified bacterial agents as the cause of diseases classified elsewhere: Secondary | ICD-10-CM

## 2021-10-26 MED ORDER — METRONIDAZOLE 500 MG PO TABS: 500.0000 mg | ORAL_TABLET | Freq: Two times a day (BID) | ORAL | 0 refills | Status: DC

## 2021-10-26 NOTE — Progress Notes (Signed)
Message sent to patient requesting further input regarding current symptoms. Awaiting patient response.  

## 2021-10-26 NOTE — Progress Notes (Signed)
I have spent 5 minutes in review of e-visit questionnaire, review and updating patient chart, medical decision making and response to patient.   Tiphany Fayson Cody Janice Bodine, PA-C    

## 2021-10-26 NOTE — Progress Notes (Signed)

## 2021-11-13 ENCOUNTER — Other Ambulatory Visit (HOSPITAL_COMMUNITY): Payer: Self-pay | Admitting: Adult Health

## 2021-11-13 DIAGNOSIS — N644 Mastodynia: Secondary | ICD-10-CM

## 2021-11-15 ENCOUNTER — Ambulatory Visit: Payer: Medicaid Other | Admitting: Adult Health

## 2021-11-15 ENCOUNTER — Encounter: Payer: Self-pay | Admitting: Adult Health

## 2021-11-15 VITALS — BP 135/94 | HR 87 | Ht 65.0 in | Wt 288.0 lb

## 2021-11-15 DIAGNOSIS — N6321 Unspecified lump in the left breast, upper outer quadrant: Secondary | ICD-10-CM

## 2021-11-15 DIAGNOSIS — N644 Mastodynia: Secondary | ICD-10-CM

## 2021-11-15 NOTE — Progress Notes (Signed)
  Subjective:     Patient ID: [name], female   DOB: 1980-10-09, 41 y.o.   MRN: [id number]  HPI [name] is a 41 year old black female,separated, G2P2 in complaining of pain in left breast for 7 days. She has a known fibroadenoma and history of nipple discharge. She called radiology and had appt made for left diagnostic mammogram and [place] for 11/28/21, and told to see me.   Lab Results  Component Value Date   DIAGPAP  02/07/2021    - Negative for intraepithelial lesion or malignancy (NILM)   HPV NOT DETECTED 04/10/2018   [place] Negative 02/07/2021   PCP is Dr Posey Pronto.  Review of Systems Pain in left breast  Reviewed past medical,surgical, social and family history. Reviewed medications and allergies.     Objective:   Physical Exam BP (!) 135/94 (BP Location: Left Arm, Patient Position: Sitting, Cuff Size: Large)   Pulse 87   Ht '5\' 5"'$  (1.651 m)   Wt 288 lb (130.6 kg)   Breastfeeding No   BMI 47.93 kg/m      Skin warm and dry,  Breasts:no dominate palpable mass, retraction or nipple discharge on the right, on the left no retraction or nipple discharge, and has mass and tenderness at 2-3 o'clock 6 FB from areola, tender at nipple.  Fall risk is low  [place] - 11/15/21 1609       Pregnancy Intention Screening   Does the patient want to become pregnant in the next year? No    Does the patient's partner want to become pregnant in the next year? No    Would the patient like to discuss contraceptive options today? No      Contraception Wrap Up   Current Method Female Sterilization    End Method Female Sterilization             Assessment:     1. Pain of left breast Pain in left breat x 7 days UOQ and nipple area  Has diagnostic mammogram and [place] 11/28/21 at [facility] Use ice and take tylenol and advil  2. Mass of upper outer quadrant of left breast Has appt 11/28/21 at [facility] for left diagnostic mammogram and [place] already     Plan:     Follow up prn

## 2021-11-28 ENCOUNTER — Ambulatory Visit (HOSPITAL_COMMUNITY)
Admission: RE | Admit: 2021-11-28 | Discharge: 2021-11-28 | Disposition: A | Discharge status: Home / Self Care | Payer: Medicaid Other | Source: Ambulatory Visit | Attending: Adult Health | Admitting: Adult Health

## 2021-11-28 DIAGNOSIS — N644 Mastodynia: Secondary | ICD-10-CM

## 2021-12-11 ENCOUNTER — Other Ambulatory Visit: Payer: Self-pay | Admitting: *Deleted

## 2021-12-11 NOTE — Patient Instructions (Signed)
Visit Information  [name] was given information about Medicaid Managed Care team care coordination services as a part of their Healthy [facility] Medicaid benefit. [name] verbally consented to engagement with the [facility] Managed Care team.   If you are experiencing a medical emergency, please call 911 or report to your local emergency department or urgent care.   If you have a non-emergency medical problem during routine business hours, please contact your provider's office and ask to speak with a nurse.   For questions related to your Healthy [facility] health plan, please call: [phone] or visit the homepage here: [email or web address]  If you would like to schedule transportation through your Healthy [facility] plan, please call the following number at least 2 days in advance of your appointment: [phone]  For information about your ride after you set it up, call Ride Assist at [phone]. Use this number to activate a Will Call pickup, or if your transportation is late for a scheduled pickup. Use this number, too, if you need to make a change or cancel a previously scheduled reservation.  If you need transportation services right away, call [phone]. The after-hours call center is staffed 24 hours to handle ride assistance and urgent reservation requests (including discharges) 365 days a year. Urgent trips include sick visits, hospital discharge requests and life-sustaining treatment.  Call the [facility] at [phone], at any time, 24 hours a day, 7 days a week. If you are in danger or need immediate medical attention call 911.  If you would like help to quit smoking, call 1-800-QUIT-NOW 820 644 5783) OR Espaol: 1-855-Djelo-Ya (9-373-428-7681) o para ms informacin haga clic aqu or Text READY to 200-400 to register via text  Ms. Moscoso,   Please see education materials related to HTN  and Asthma provided by MyChart link.  Patient verbalizes understanding of instructions and care plan provided today and agrees to view in [place]. Active MyChart status and patient understanding of how to access instructions and care plan via MyChart confirmed with patient.     Telephone follow up appointment with Managed Medicaid care management team member scheduled for:02/12/22 @ 10:30am  Mary Joiner RN, BSN [place] RN Care Coordinator   Following is a copy of your plan of care:  Care Plan : " [place]  Updates made by Mary Montane, RN since 12/11/2021 12:00 AM     Problem: Knowledge Deficit and Care Coordination Needs Related to Management of  Asthma, HTN recurrent UTIs and kidney stones   Priority: High     Long-Range Goal: Development of Plan Of Care to Address Care Coordination Needs and Knowledge Deficits for Management of asthma, HTN, recurrent UTIs and kidney stones   Start Date: 08/01/2021  Expected End Date: 08/02/2022  Priority: High  Note:   Current Barriers:  Knowledge Deficits related to plan of care for management of HTN and asthma and recurrent UTIs with kidney stones  Care Coordination needs related to Financial constraints related to utility bills  Film/video editor.  Insomnia RNCM Clinical Goal(s):  Patient will verbalize understanding of plan for management of HTN, Asthma, and recurrent UTIs with kidney stones as evidenced by decreased incidences/reports of UTIs, no hospital admissions or ED visits related to complications of HTN, asthma or UTI/Kidney stones take all medications exactly as prescribed and will call provider for medication related questions as evidenced by patient reports of no missed medications    attend all scheduled  medical appointments: with medical providers as evidenced by absence of no shows for medical appointments        demonstrate improved adherence to prescribed treatment plan for  HTN, Asthma, and recurrent UTIs with kidney stones as evidenced by decreased incidences/reports of UTIs, no hospital admissions or ED related to complications of HTN, Asthma, UTIs or kidney stones continue to work with RN Care Manager and/or Social Worker to address care management and care coordination needs related to HTN, Asthma, and recurrent UTIs and kidney stones as evidenced by adherence to CM Team Scheduled appointments     work with [name] care guide to address needs related to Financial constraints related to paying utility bills as evidenced by patient and/or community resource care guide support    through collaboration with Consulting civil engineer, provider, and care team.   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, reports having difficulty managing household bills, say her Mom provides babysitting services for her 55 month old daughter when she is able to work, also says she is currently going through a divorce Collaborated with BSW to initiate plan of care to address needs related to Financial constraints related to payment of household bills  in patient with HTN, Asthma, and recurrent UTIs with kidney stones Collaboration with [name], MD regarding development and update of comprehensive plan of care as evidenced by provider attestation and co-signature Inter-disciplinary care team collaboration   Ensured patient spoke with Managed Medicaid BSW to address financial insecurity on 08/03/21, reviewed follow up telephone appointment with BSW on  09/04/21 at 11:00 am Ensured patient received household bills assistance/ resources, dental and ophthalmology resources from community care guide    Asthma: (Status:Goal on track:  Yes.) Long Term Goal-Recent asthma attack due to the heat Advised patient to track and manage Asthma triggers Provided instruction about proper use of medications used for management of Asthma including inhalers Advised patient to self assesses Asthma action plan zone and make appointment with provider if in the yellow zone for 48 hours without improvement Provided education about and advised patient to utilize infection prevention strategies to reduce risk of respiratory infection Discussed the importance of adequate rest and management of fatigue with Asthma Advised patient to call for refill on Emergency inhaler prior to running out   Ensured patient received written educational information mailed to patient's home address and provided opportunity for questions  Recurrent UTIs and Kidney Stones - s/p OP surgery for removal of kidney stone and ureteral stent on 3/30/223 (Status: Goal Met.) Long Term Goal - patient states she continues to have dysuria and left flank pain that she treats with OTC Tylenol, Urology follow up and ultrasound need scheduling  Evaluation of current treatment plan related to  recurrent UTIs and kidney stones ,  self-management and patient's adherence to plan as established by provider. Discussed plans with patient for ongoing care management follow up and provided patient with direct contact information for care management team  Hypertension: (Status: Goal on Track (progressing): YES.) Long Term Goal - Patient checking BP every other day, reading yesterday 130/85  Last practice  recorded BP readings:  BP Readings from Last 3 Encounters:  11/15/21 (!) 135/94  09/14/21 118/82  09/11/21 117/83    Most recent eGFR/CrCl:  Lab Results  Component Value Date   EGFR 116 08/15/2021    No  components found for: "CRCL"  Evaluation of current treatment plan related to hypertension self management and patient's adherence to plan as established by provider;   Reviewed medications with patient and discussed importance of compliance;  Reviewed scheduled/upcoming provider appointments including:  Provided education on prescribed diet DASH;  reviewed BP treatment targets and encouraged patient to bring home BP readings to all primary care provider appointments: PCP on 01/15/22 @ 2pm    Patient Goals/Self-Care Activities: Take medications as prescribed   Attend all scheduled provider appointments Call pharmacy for medication refills 3-7 days in advance of running out of medications Perform all self care activities independently  Perform IADL's (shopping, preparing meals, housekeeping, managing finances) independently Call provider office for new concerns or questions  Work with the social worker to address care coordination needs and will continue to work with the clinical team to address health care and disease management related needs check blood pressure weekly write blood pressure results in a log or diary learn about high blood pressure call doctor for signs and symptoms of high blood pressure

## 2021-12-11 NOTE — Patient Outreach (Signed)
Medicaid Managed Care   Nurse Care Manager Note  12/11/2021 Name:  [name] MRN:  [id number] DOB:  1980/07/25  [name] is an 41 y.o. year old female who is a primary patient of [name], MD.  The [facility] Managed Care Coordination team was consulted for assistance with:    HTN Asthma  [name] was given information about Medicaid Managed Care Coordination team services today. [name] Patient agreed to services and verbal consent obtained.  Engaged with patient by telephone for follow up visit in response to provider referral for case management and/or care coordination services.   Assessments/Interventions:  Review of past medical history, allergies, medications, health status, including review of consultants reports, laboratory and other test data, was performed as part of comprehensive evaluation and provision of chronic care management services.  SDOH (Social Determinants of Health) assessments and interventions performed:   Care Plan  Allergies  Allergen Reactions   Banana Rash   Pineapple Rash    Medications Reviewed Today     Reviewed by Mary Montane, RN (Registered Nurse) on 12/11/21 at 1037  Med List Status: <None>   Medication Order Taking? Sig Documenting Provider Last Dose Status Informant  acetaminophen (TYLENOL) 500 MG tablet [id number] Yes Take 1,000 mg by mouth every 6 (six) hours as needed for mild pain. [provider] Taking Active Self  albuterol (PROVENTIL HFA) 108 (90 Base) MCG/ACT inhaler [id number] Yes Inhale 2 puffs into the lungs every 6 (six) hours as needed for wheezing (cough). [name], MD Taking Active Self  amLODipine (NORVASC) 5 MG tablet [id number] Yes Take 1 tablet (5 mg total) by mouth daily. [name], MD Taking Active   Blood Pressure Monitoring (BLOOD PRESSURE CUFF) MISC [id number] Yes Large cuff. ICD10: I10. Check blood pressure once daily. Please contact office if  blood pressure above 150/90. [name], MD Taking Active   EPINEPHrine 0.3 mg/0.3 mL IJ SOAJ injection [id number] Yes Inject 0.3 mg into the muscle as needed for anaphylaxis. 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 [provider] Taking Active Self  fluticasone-salmeterol (ADVAIR HFA) 270-35 MCG/ACT inhaler [id number] Yes Inhale 2 puffs into the lungs 2 (two) times daily.  Patient taking differently: Inhale 2 puffs into the lungs daily.   [name], MD Taking Active Self           Med Note [name], Renne Musca Aug 01, 2021  3:02 PM) Teaching done with patient advising her to administer twice daily not once daily as she is currently doing  hydrochlorothiazide (MICROZIDE) 12.5 MG capsule [id number] Yes Take 1 capsule (12.5 mg total) by mouth daily. TAKE (1) CAPSULE BY MOUTH ONCE EVERY DAY. Strength: 12.5 mg [name], MD Taking Active Self  Olopatadine HCl 0.2 % SOLN [id number] Yes Use 1 drop in each eye once a day as needed for itchy watery eyes Althea Charon, FNP Taking Active Self  potassium chloride SA (KLOR-CON M) 20 MEQ tablet [id number] Yes Take 1 tablet (20 mEq total) by mouth daily. Barton Dubois, MD Taking Active Self  rOPINIRole (REQUIP) 1 MG tablet [id number] Yes Take 1 tablet (1 mg  total) by mouth at bedtime. [name], MD Taking Active   SUMAtriptan (IMITREX) 50 MG tablet [id number] Yes Take 1 tablet (50 mg total) by mouth every 2 (two) hours as needed for migraine. May repeat in 2 hours if headache persists or recurs. [name], MD Taking Active    Patient not taking:   Discontinued 11/12/18 1700             Patient Active Problem List   Diagnosis Date Noted   Mass of upper outer quadrant of  left breast 11/15/2021   Pain of left breast 11/15/2021   Intractable migraine without aura and without status migrainosus 09/14/2021   Urge incontinence 09/14/2021   Hospital discharge follow-up 07/14/2021   Hydronephrosis 06/28/2021   Fibroadenoma of breast, left 03/31/2021   Restless legs 02/10/2021   Encounter for gynecological examination with Papanicolaou smear of cervix 02/07/2021   Vitamin D deficiency 09/30/2020   Menorrhagia with irregular cycle 08/17/2020   Chronic right shoulder pain 07/01/2020   Mild persistent asthma, uncomplicated 40/98/1191   Abnormal uterine bleeding (AUB) 03/12/2017   Intramural leiomyoma of uterus 08/24/2015   Polyclonal gammopathy 07/26/2013   Dysplasia of cervix, unspecified 03/31/2013   Hypertension 09/21/2010   Morbid obesity ([place]) 11/03/2007    Conditions to be addressed/monitored per PCP order:  HTN and Asthma  Care Plan : " RN Care Manager Plan Of Care  Updates made by Mary Montane, RN since 12/11/2021 12:00 AM     Problem: Knowledge Deficit and Care Coordination Needs Related to Management of  Asthma, HTN recurrent UTIs and kidney stones   Priority: High     Long-Range Goal: Development of Plan Of Care to Address Care Coordination Needs and Knowledge Deficits for Management of asthma, HTN, recurrent UTIs and kidney stones   Start Date: 08/01/2021  Expected End Date: 08/02/2022  Priority: High  Note:   Current Barriers:  Knowledge Deficits related to plan of care for management of HTN and asthma and recurrent UTIs with kidney stones  Care Coordination needs related to Financial constraints related to utility bills  Financial Constraints.  Insomnia RNCM Clinical Goal(s):  Patient will verbalize understanding of plan for management of HTN, Asthma, and recurrent UTIs with kidney stones as evidenced by decreased incidences/reports of UTIs, no hospital admissions or ED visits related to complications of HTN, asthma or UTI/Kidney  stones take all medications exactly as prescribed and will call provider for medication related questions as evidenced by patient reports of no missed medications    attend all scheduled medical appointments: with medical providers as evidenced by absence of no shows for medical appointments        demonstrate improved adherence to prescribed treatment plan for HTN, Asthma, and recurrent UTIs with kidney stones as evidenced by decreased incidences/reports of UTIs, no hospital admissions or ED related to complications of HTN, Asthma, UTIs or kidney stones continue to work with RN Care Manager and/or Social Worker to address care management and care coordination needs related to HTN, Asthma, and recurrent UTIs and kidney stones as evidenced by adherence to CM Team Scheduled appointments     work with [name] care guide to address needs related to Financial constraints related to paying utility bills as evidenced by patient and/or community resource care guide support    through collaboration with Consulting civil engineer, provider, and care team.   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, reports having difficulty managing household bills, say her Mom provides babysitting services for her 72 month old daughter when she is able to work, also says she is currently going through a divorce Collaborated with BSW to initiate plan of care to address needs related to Financial constraints related to payment of household bills  in patient with HTN, Asthma, and recurrent UTIs with  kidney stones Collaboration with [name], MD regarding development and update of comprehensive plan of care as evidenced by provider attestation and co-signature Inter-disciplinary care team collaboration   Ensured patient spoke with Managed Medicaid BSW to address financial insecurity on 08/03/21, reviewed follow up telephone appointment with BSW on 09/04/21 at 11:00 am Ensured patient received household bills assistance/ resources, dental and ophthalmology resources from community care guide    Asthma: (Status:Goal on track:  Yes.) Long Term Goal-Recent asthma attack due to the heat Advised patient to track and manage Asthma triggers Provided instruction about proper use of medications used for management of Asthma including inhalers Advised patient to self assesses Asthma action plan zone and make appointment with provider if in the yellow zone for 48 hours without improvement Provided education about and advised patient to utilize infection prevention strategies to reduce risk of respiratory infection Discussed the importance of adequate rest and management of fatigue with Asthma Advised patient to call for refill on Emergency inhaler prior to running out   Ensured patient received written educational information mailed to patient's home address and provided opportunity for questions  Recurrent UTIs and Kidney Stones - s/p OP surgery for removal of kidney stone and ureteral stent on 3/30/223 (Status: Goal Met.) Long Term Goal - patient states she continues to have dysuria and left flank pain that she treats with OTC Tylenol, Urology follow up and ultrasound need scheduling  Evaluation of current treatment plan related to  recurrent UTIs and kidney stones ,  self-management and patient's adherence to plan as established by provider. Discussed plans with patient for ongoing care management follow up and provided patient with direct contact information for care management team  Hypertension:  (Status: Goal on Track (progressing): YES.) Long Term Goal - Patient checking BP every other day, reading yesterday 130/85  Last practice recorded BP readings:  BP Readings from Last 3 Encounters:  11/15/21 (!) 135/94  09/14/21 118/82  09/11/21 117/83    Most recent eGFR/CrCl:  Lab Results  Component Value Date   EGFR 116 08/15/2021    No components found for: "CRCL"  Evaluation of current treatment plan related to hypertension self management and patient's adherence to plan as established by provider;   Reviewed medications with patient and discussed importance of compliance;  Reviewed scheduled/upcoming provider appointments including:  Provided education on prescribed diet DASH;  reviewed BP treatment targets and encouraged patient to bring home BP readings to all primary care provider appointments: PCP on 01/15/22 @ 2pm    Patient Goals/Self-Care Activities: Take medications as prescribed   Attend all scheduled provider appointments Call pharmacy for medication refills 3-7 days in advance of running out of medications Perform all self care activities independently  Perform IADL's (shopping, preparing meals, housekeeping, managing finances) independently Call  provider office for new concerns or questions  Work with the social worker to address care coordination needs and will continue to work with the clinical team to address health care and disease management related needs check blood pressure weekly write blood pressure results in a log or diary learn about high blood pressure call doctor for signs and symptoms of high blood pressure        Follow Up:  Patient agrees to Care Plan and Follow-up.  Plan: The Managed Medicaid care management team will reach out to the patient again over the next 60 days.  Date/time of next scheduled RN care management/care coordination outreach:  02/12/22 @ 10:30am  [name] RN, BSN [place] RN Care  Coordinator

## 2022-01-04 ENCOUNTER — Telehealth: Payer: Medicaid Other | Admitting: Physician Assistant

## 2022-01-04 DIAGNOSIS — B9689 Other specified bacterial agents as the cause of diseases classified elsewhere: Secondary | ICD-10-CM

## 2022-01-04 DIAGNOSIS — N760 Acute vaginitis: Secondary | ICD-10-CM | POA: Diagnosis not present

## 2022-01-04 MED ORDER — METRONIDAZOLE 500 MG PO TABS: 500.0000 mg | ORAL_TABLET | Freq: Two times a day (BID) | ORAL | 0 refills | Status: DC

## 2022-01-04 NOTE — Progress Notes (Signed)
I have spent 5 minutes in review of e-visit questionnaire, review and updating patient chart, medical decision making and response to patient.   Ryah Cribb Cody Nichola Warren, PA-C    

## 2022-01-04 NOTE — Progress Notes (Signed)

## 2022-01-05 ENCOUNTER — Other Ambulatory Visit: Payer: Self-pay | Admitting: Internal Medicine

## 2022-01-05 ENCOUNTER — Encounter: Payer: Self-pay | Admitting: Internal Medicine

## 2022-01-05 DIAGNOSIS — I10 Essential (primary) hypertension: Secondary | ICD-10-CM

## 2022-01-15 ENCOUNTER — Encounter: Payer: Self-pay | Admitting: Internal Medicine

## 2022-01-15 ENCOUNTER — Ambulatory Visit (INDEPENDENT_AMBULATORY_CARE_PROVIDER_SITE_OTHER): Payer: Medicaid Other | Admitting: Internal Medicine

## 2022-01-15 VITALS — BP 138/88 | HR 87 | Ht 65.0 in | Wt 291.0 lb

## 2022-01-15 DIAGNOSIS — Z23 Encounter for immunization: Secondary | ICD-10-CM | POA: Diagnosis not present

## 2022-01-15 DIAGNOSIS — E782 Mixed hyperlipidemia: Secondary | ICD-10-CM

## 2022-01-15 DIAGNOSIS — G43019 Migraine without aura, intractable, without status migrainosus: Secondary | ICD-10-CM

## 2022-01-15 DIAGNOSIS — G2581 Restless legs syndrome: Secondary | ICD-10-CM

## 2022-01-15 DIAGNOSIS — I10 Essential (primary) hypertension: Secondary | ICD-10-CM | POA: Diagnosis not present

## 2022-01-15 DIAGNOSIS — E559 Vitamin D deficiency, unspecified: Secondary | ICD-10-CM

## 2022-01-15 MED ORDER — SUMATRIPTAN SUCCINATE 100 MG PO TABS: 100.0000 mg | ORAL_TABLET | ORAL | 2 refills | Status: DC | PRN

## 2022-01-15 MED ORDER — PROPRANOLOL HCL 20 MG PO TABS: 20.0000 mg | ORAL_TABLET | Freq: Two times a day (BID) | ORAL | 2 refills | Status: DC

## 2022-01-15 NOTE — Assessment & Plan Note (Signed)
Overall well controlled with ropinirole 

## 2022-01-15 NOTE — Assessment & Plan Note (Signed)
Her headaches are likely migraine Increased dose of sumatriptan to 100 mg, as needed for migraine Has about 2 episodes per week, headache propranolol for migraine ppx Tylenol PRN for mild to moderate pain

## 2022-01-15 NOTE — Assessment & Plan Note (Signed)
Has been less active since delivery Plans to resume exercise DASH diet and moderate exercise/walking at least 150 mins/week She is interested in GLP-1 agonist therapy, but insurance coverage is concern 

## 2022-01-15 NOTE — Progress Notes (Signed)
Established Patient Office Visit  Subjective:  Patient ID: [name], female    DOB: 1981-03-02  Age: 41 y.o. MRN: [id number]  CC:  Chief Complaint  Patient presents with   Hypertension    pt bp been running high for about 1.5 weeks.    HPI [name] is a 41 y.o. female with past medical history of hypertension, asthma, gestational diabetes and morbid obesity who presents for f/u of her chronic medical conditions.   HTN: Her BP was more borderline elevated today.  She reports that her BP has been running high at home, around 140s/90s.  She has an automatic wrist cuff at home.  She has headache at times, but denies any chest pain, dyspnea or palpitations currently.  Migraine: She complains of about 2 episodes of migraine every week.  She has tried taking sumatriptan, which helps somewhat. She complains of intermittent headache, unilateral at times, lasting for few hours to a day and is associated with dizziness and nausea.  She denies any photophobia or phonophobia currently.  Obesity: She states that she has been trying to follow healthy diet to lose weight.  She has not been able to lose weight.  Restless leg syndrome: She has noticed improvement in her leg pain/cramps with ropinirole.   Past Medical History:  Diagnosis Date   AKI (acute kidney injury) ([place]) 08/17/2021   Allergy    banana/pineapple   Anemia    post pregnancy   Anxiety    Asthma    Autoimmune disease, not elsewhere classified(279.49) 2013   Non specific- [facility], abnormal Bone Marrow signal   Breast lump in female 11/28/2012   Has tender round mass at 10-11 0'clock right breast 4 finger breaths from [place] will US   Breast mass, right 08/18/2012   Chlamydia    Chronic breast pain 05/31/2015   Dysmenorrhea 03/23/2014   Dyspareunia 01/05/2014   Elevated prolactin level 02/10/2021   Was 54.5, recheck in December ________   Fibroids, intramural 08/24/2015   GERD  (gastroesophageal reflux disease)    Gonorrhea    Headache(784.0)    History of abnormal cervical Pap smear 04/02/2014   Hypertension    Kidney stone    06/2021   LLQ abdominal tenderness 05/31/2015   Menorrhagia 01/05/2014   Neuromuscular disorder (HCC)    lower back and bilat leg pain   Obesity    Other and unspecified ovarian cyst 01/12/2014   Polyclonal gammopathy 07/26/2013   Insignificant   Pyelonephritis 06/28/2021   Recurrent upper respiratory infection (URI)    Scoliosis    Urticaria    Vaginal irritation 09/30/2014   Vaginal Pap smear, abnormal     Past Surgical History:  Procedure Laterality Date   ADENOIDECTOMY     BREAST BIOPSY Left 02/2021   CYSTOSCOPY W/ URETERAL STENT PLACEMENT Left 06/29/2021   Procedure: CYSTOSCOPY WITH RETROGRADE PYELOGRAM/URETERAL STENT PLACEMENT;  Surgeon: Cleon Gustin, MD;  Location: [facility];  Service: Urology;  Laterality: Left;   CYSTOSCOPY WITH RETROGRADE PYELOGRAM, URETEROSCOPY AND STENT PLACEMENT Left 07/27/2021   Procedure: CYSTOSCOPY WITH RETROGRADE PYELOGRAM, URETEROSCOPY, STONE EXTRACTION WITH BASKET;  Surgeon: Cleon Gustin, MD;  Location: [facility];  Service: Urology;  Laterality: Left;   DILATATION AND CURETTAGE/HYSTEROSCOPY WITH MINERVA N/A 03/14/2021   Procedure: HYSTEROSCOPY WITH MINERVA;  Surgeon: Florian Buff, MD;  Location: [facility];  Service: Gynecology;  Laterality: N/A;   DILATION AND CURETTAGE OF UTERUS     LAPAROSCOPIC BILATERAL SALPINGECTOMY Bilateral 03/16/2020  Procedure: LAPAROSCOPIC BILATERAL SALPINGECTOMY;  Surgeon: Florian Buff, MD;  Location: [facility];  Service: Gynecology;  Laterality: Bilateral;   SHOULDER SURGERY Right 03/10/2019   TONSILLECTOMY     TUBAL LIGATION N/A    Phreesia 06/28/2020    Family History  Problem Relation Age of Onset   Hypertension Mother    Diabetes Mother    Heart disease Mother    Diabetes Father    Cancer Paternal Aunt        breast   Hypertension Maternal  Grandmother    Diabetes Son        pre diabetic   Other Son        overactive bladder   COPD Maternal Aunt    Leukemia Maternal Aunt    Allergic rhinitis Neg Hx    Angioedema Neg Hx    Asthma Neg Hx    Atopy Neg Hx    Eczema Neg Hx    Immunodeficiency Neg Hx    Urticaria Neg Hx     Social History   Socioeconomic History   Marital status: Legally Separated    Spouse name: Jameel   Number of children: 2   Years of education: Not on file   Highest education level: Not on file  Occupational History   Not on file  Tobacco Use   Smoking status: Never   Smokeless tobacco: Never  Vaping Use   Vaping Use: Never used  Substance and Sexual Activity   Alcohol use: No   Drug use: No   Sexual activity: Yes    Birth control/protection: Surgical    Comment: tubal & ablation  Other Topics Concern   Not on file  Social History Narrative   Not on file   Social Determinants of Health   Financial Resource Strain: High Risk (08/03/2021)   Overall Financial Resource Strain (CARDIA)    Difficulty of Paying Living Expenses: Very hard  Food Insecurity: No Food Insecurity (08/01/2021)   Hunger Vital Sign    Worried About Running Out of Food in the Last Year: Never true    Ran Out of Food in the Last Year: Never true  Transportation Needs: No Transportation Needs (10/06/2021)   PRAPARE - Hydrologist (Medical): No    Lack of Transportation (Non-Medical): No  Physical Activity: Insufficiently Active (02/23/2020)   Exercise Vital Sign    Days of Exercise per Week: 2 days    Minutes of Exercise per Session: 10 min  Stress: Stress Concern Present (09/02/2021)   [place]    Feeling of Stress : Rather much  Social Connections: Unknown (09/01/2021)   Social Connection and Isolation Panel [NHANES]    Frequency of Communication with Friends and Family: More than three times a week    Frequency of Social  Gatherings with Friends and Family: Three times a week    Attends Religious Services: 1 to 4 times per year    Active Member of Clubs or Organizations: No    Attends Archivist Meetings: Never    Marital Status: Not on file  Intimate Partner Violence: Not At Risk (09/01/2021)   Humiliation, Afraid, Rape, and Kick questionnaire    Fear of Current or Ex-Partner: No    Emotionally Abused: No    Physically Abused: No    Sexually Abused: No    Outpatient Medications Prior to Visit  Medication Sig Dispense Refill   albuterol (PROVENTIL HFA) 108 (  90 Base) MCG/ACT inhaler Inhale 2 puffs into the lungs every 6 (six) hours as needed for wheezing (cough). 8 g 5   amLODipine (NORVASC) 5 MG tablet TAKE 1 TABLET BY MOUTH DAILY. 90 tablet 0   Blood Pressure Monitoring (BLOOD PRESSURE CUFF) MISC Large cuff. ICD10: I10. Check blood pressure once daily. Please contact office if blood pressure above 150/90. 1 each 0   EPINEPHrine 0.3 mg/0.3 mL IJ SOAJ injection Inject 0.3 mg into the muscle as needed for anaphylaxis. 1 each 1   fluticasone-salmeterol (ADVAIR HFA) 115-21 MCG/ACT inhaler Inhale 2 puffs into the lungs 2 (two) times daily. (Patient taking differently: Inhale 2 puffs into the lungs daily.) 1 each 12   hydrochlorothiazide (MICROZIDE) 12.5 MG capsule TAKE (1) CAPSULE BY MOUTH ONCE EVERY DAY. 90 capsule 0   Olopatadine HCl 0.2 % SOLN Use 1 drop in each eye once a day as needed for itchy watery eyes 2.5 mL 3   potassium chloride SA (KLOR-CON M) 20 MEQ tablet Take 1 tablet (20 mEq total) by mouth daily. 30 tablet 0   Pyridoxine HCl (VITAMIN B6 PO) Take by mouth.     rOPINIRole (REQUIP) 1 MG tablet Take 1 tablet (1 mg total) by mouth at bedtime. 30 tablet 1   acetaminophen (TYLENOL) 500 MG tablet Take 1,000 mg by mouth every 6 (six) hours as needed for mild pain.     fluticasone (FLONASE) 50 MCG/ACT nasal spray Place 2 sprays into both nostrils daily as needed for allergies or rhinitis.      SUMAtriptan (IMITREX) 50 MG tablet Take 1 tablet (50 mg total) by mouth every 2 (two) hours as needed for migraine. May repeat in 2 hours if headache persists or recurs. 10 tablet 2   metroNIDAZOLE (FLAGYL) 500 MG tablet Take 1 tablet (500 mg total) by mouth 2 (two) times daily. (Patient not taking: Reported on 01/15/2022) 14 tablet 0   No facility-administered medications prior to visit.    Allergies  Allergen Reactions   Banana Rash   Pineapple Rash    ROS Review of Systems  Constitutional:  Negative for chills and fever.  HENT:  Negative for congestion, sinus pressure, sinus pain and sore throat.   Eyes:  Negative for pain and discharge.  Respiratory:  Negative for cough and wheezing.   Cardiovascular:  Positive for leg swelling. Negative for chest pain and palpitations.  Gastrointestinal:  Negative for abdominal pain, diarrhea, nausea and vomiting.  Endocrine: Negative for polydipsia and polyuria.  Genitourinary:  Negative for dysuria and hematuria.  Musculoskeletal:  Negative for neck pain and neck stiffness.       Leg pain  Skin:  Negative for rash.  Neurological:  Positive for headaches. Negative for dizziness and weakness.  Psychiatric/Behavioral:  Negative for agitation and behavioral problems.       Objective:    Physical Exam Vitals reviewed.  Constitutional:      General: She is not in acute distress.    Appearance: She is obese. She is not diaphoretic.  HENT:     Head: Normocephalic and atraumatic.     Nose: Nose normal. No congestion.     Mouth/Throat:     Mouth: Mucous membranes are moist.     Pharynx: No posterior oropharyngeal erythema.  Eyes:     General: No scleral icterus.    Extraocular Movements: Extraocular movements intact.  Cardiovascular:     Rate and Rhythm: Normal rate and regular rhythm.     Pulses: Normal pulses.  Heart sounds: Normal heart sounds. No murmur heard. Pulmonary:     Breath sounds: Normal breath sounds. No wheezing or  rales.  Musculoskeletal:     Cervical back: Neck supple. No tenderness.     Right lower leg: No edema.     Left lower leg: No edema.  Skin:    General: Skin is warm.     Findings: No rash.  Neurological:     General: No focal deficit present.     Mental Status: She is alert and oriented to person, place, and time.     Sensory: No sensory deficit.     Motor: No weakness.  Psychiatric:        Mood and Affect: Mood normal.        Behavior: Behavior normal.     BP 138/88 (BP Location: Right Arm, Cuff Size: Normal)   Pulse 87   Ht _0  (1.651 m)   Wt 291 lb (132 kg)   SpO2 99%   BMI 48.42 kg/m  Wt Readings from Last 3 Encounters:  01/15/22 291 lb (132 kg)  11/15/21 288 lb (130.6 kg)  09/14/21 282 lb (127.9 kg)    Lab Results  Component Value Date   TSH 2.080 02/08/2021   Lab Results  Component Value Date   WBC 9.0 07/10/2021   HGB 12.4 07/10/2021   HCT 38.4 07/10/2021   MCV 91.4 07/10/2021   PLT 373 07/10/2021   Lab Results  Component Value Date   NA 137 08/15/2021   K 3.3 (L) 08/15/2021   CO2 23 08/15/2021   GLUCOSE 103 (H) 08/15/2021   BUN 10 08/15/2021   CREATININE 0.64 08/15/2021   BILITOT 0.4 07/10/2021   ALKPHOS 80 07/10/2021   AST 9 (L) 07/10/2021   ALT 8 07/10/2021   PROT 7.1 07/10/2021   ALBUMIN 3.2 (L) 07/10/2021   CALCIUM 8.7 08/15/2021   ANIONGAP 7 07/10/2021   EGFR 116 08/15/2021   Lab Results  Component Value Date   CHOL 116 09/28/2020   Lab Results  Component Value Date   HDL 36 (L) 09/28/2020   Lab Results  Component Value Date   LDLCALC 62 09/28/2020   Lab Results  Component Value Date   TRIG 92 09/28/2020   Lab Results  Component Value Date   CHOLHDL 3.2 09/28/2020   Lab Results  Component Value Date   HGBA1C 5.4 09/28/2020      Assessment & Plan:   Problem List Items Addressed This Visit       Cardiovascular and Mediastinum   Hypertension - Primary    BP Readings from Last 1 Encounters:  01/15/22 138/88   Uncontrolled with Amlodipine and HCTZ Added propranolol - for migraine, should help with BP control as well Counseled for compliance with the medications Advised DASH diet and moderate exercise/walking, at least 150 mins/week      Relevant Medications   propranolol (INDERAL) 20 MG tablet   Other Relevant Orders   CMP14+EGFR   CBC with Differential/Platelet   Intractable migraine without aura and without status migrainosus    Her headaches are likely migraine Increased dose of sumatriptan to 100 mg, as needed for migraine Has about 2 episodes per week, headache propranolol for migraine ppx Tylenol PRN for mild to moderate pain      Relevant Medications   SUMAtriptan (IMITREX) 100 MG tablet   propranolol (INDERAL) 20 MG tablet   Other Relevant Orders   TSH   CMP14+EGFR   CBC with  Differential/Platelet     Other   Morbid obesity ([place])    Has been less active since delivery Plans to resume exercise DASH diet and moderate exercise/walking at least 150 mins/week She is interested in GLP-1 agonist therapy, but insurance coverage is concern      Relevant Orders   TSH   Hemoglobin A1c   Vitamin D deficiency   Relevant Orders   VITAMIN D 25 Hydroxy (Vit-D Deficiency, Fractures)   Restless legs    Overall well controlled with ropinirole      Relevant Orders   TSH   Other Visit Diagnoses     Need for immunization against influenza       Relevant Orders   Flu Vaccine QUAD 22moIM (Fluarix, Fluzone & Alfiuria Quad PF) (Completed)   Mixed hyperlipidemia       Relevant Medications   propranolol (INDERAL) 20 MG tablet   Other Relevant Orders   Lipid panel       Meds ordered this encounter  Medications   SUMAtriptan (IMITREX) 100 MG tablet    Sig: Take 1 tablet (100 mg total) by mouth every 2 (two) hours as needed for migraine. May repeat in 2 hours if headache persists or recurs.    Dispense:  10 tablet    Refill:  2   propranolol (INDERAL) 20 MG tablet    Sig:  Take 1 tablet (20 mg total) by mouth 2 (two) times daily.    Dispense:  60 tablet    Refill:  2    Follow-up: Return in about 3 months (around 04/16/2022).    RLindell Spar MD

## 2022-01-15 NOTE — Assessment & Plan Note (Signed)
BP Readings from Last 1 Encounters:  01/15/22 138/88   Uncontrolled with Amlodipine and HCTZ Added propranolol - for migraine, should help with BP control as well Counseled for compliance with the medications Advised DASH diet and moderate exercise/walking, at least 150 mins/week

## 2022-01-15 NOTE — Patient Instructions (Signed)
Please start taking Propranolol as prescribed.  Please take Sumatriptan as needed for migraine up to 2 tablets in a day.  Please follow DASH diet and perform moderate exercise/walking at least 150 mins/week.  Please get fasting blood tests done before the visit.

## 2022-01-23 ENCOUNTER — Encounter: Payer: Self-pay | Admitting: Obstetrics & Gynecology

## 2022-02-12 ENCOUNTER — Ambulatory Visit: Payer: Medicaid Other | Admitting: Physician Assistant

## 2022-02-12 ENCOUNTER — Ambulatory Visit (HOSPITAL_COMMUNITY)
Admission: RE | Admit: 2022-02-12 | Discharge: 2022-02-12 | Disposition: A | Discharge status: Home / Self Care | Payer: Medicaid Other | Source: Ambulatory Visit | Attending: Physician Assistant | Admitting: Physician Assistant

## 2022-02-12 ENCOUNTER — Other Ambulatory Visit: Payer: Self-pay | Admitting: *Deleted

## 2022-02-12 VITALS — BP 147/88 | HR 80 | Wt 270.0 lb

## 2022-02-12 DIAGNOSIS — R109 Unspecified abdominal pain: Secondary | ICD-10-CM | POA: Diagnosis not present

## 2022-02-12 DIAGNOSIS — N200 Calculus of kidney: Secondary | ICD-10-CM | POA: Diagnosis not present

## 2022-02-12 LAB — URINALYSIS, ROUTINE W REFLEX MICROSCOPIC
Bilirubin, UA: NEGATIVE
Glucose, UA: NEGATIVE
Leukocytes,UA: NEGATIVE
Nitrite, UA: NEGATIVE
RBC, UA: NEGATIVE
Specific Gravity, UA: 1.02 (ref 1.005–1.030)
Urobilinogen, Ur: 1 mg/dL (ref 0.2–1.0)
pH, UA: 7 (ref 5.0–7.5)

## 2022-02-12 LAB — MICROSCOPIC EXAMINATION

## 2022-02-12 LAB — BLADDER SCAN AMB NON-IMAGING: Scan Result: 0

## 2022-02-12 MED ORDER — MELOXICAM 15 MG PO TABS: 15.0000 mg | ORAL_TABLET | Freq: Every day | ORAL | 0 refills | Status: DC

## 2022-02-12 MED ORDER — METHOCARBAMOL 500 MG PO TABS: 500.0000 mg | ORAL_TABLET | Freq: Four times a day (QID) | ORAL | 0 refills | Status: DC

## 2022-02-12 MED ORDER — TAMSULOSIN HCL 0.4 MG PO CAPS: 0.4000 mg | ORAL_CAPSULE | Freq: Every day | ORAL | 0 refills | Status: DC

## 2022-02-12 NOTE — Progress Notes (Signed)
post void residual=0 ?

## 2022-02-12 NOTE — Addendum Note (Signed)
Addended by: Darcella Gasman R on: 02/12/2022 03:01 PM   Modules accepted: Orders

## 2022-02-12 NOTE — Patient Outreach (Signed)
Medicaid Managed Care   Nurse Care Manager Note  02/12/2022 Name:  EMMERSEN Kaufman MRN:  [id number] DOB:  1980/07/03  [name] is an 41 y.o. year old female who is a primary patient of [name], MD.  The [facility] Managed Care Coordination team was consulted for assistance with:    HTN Asthma Kidney stones  Ms. Iversen was given information about Medicaid Managed Care Coordination team services today. Mary Clamp Brissett Patient agreed to services and verbal consent obtained.  Engaged with patient by telephone for follow up visit in response to provider referral for case management and/or care coordination services.   Assessments/Interventions:  Review of past medical history, allergies, medications, health status, including review of consultants reports, laboratory and other test data, was performed as part of comprehensive evaluation and provision of chronic care management services.  SDOH (Social Determinants of Health) assessments and interventions performed: SDOH Interventions    Flowsheet Row Patient Outreach Telephone from 02/12/2022 in [place] Patient Outreach Telephone from 10/06/2021 in [place] Patient Outreach Telephone from 09/01/2021 in [place] Telephone from 08/03/2021 in [place] Patient Outreach Telephone from 08/01/2021 in [place] Interventions       Food Insecurity Interventions Intervention Not Indicated -- -- -- Intervention Not Indicated  Housing Interventions -- Intervention Not Indicated -- -- Intervention Not Indicated  Transportation Interventions Intervention Not Indicated Intervention Not Indicated -- -- Intervention Not Indicated  [reviewed transportation benefit of managed Medicaid health plan]  Financial Strain  Interventions --  [Referred to MM BSW, scheduled for 02/15/22 @ 1:30pm] -- -- [id number] Referral Other (Comment)  [referral to MM  BSW and community care guide]  Stress Interventions -- -- Patient Refused  [patient states she is currently going through a divorce , offered the services of LCSW. patient states she does not feel she needs the services at this time] -- --  Social Connections Interventions -- -- Intervention Not Indicated -- --       Care Plan  Allergies  Allergen Reactions   Banana Rash   Pineapple Rash    Medications Reviewed Today     Reviewed by [name], RN (Registered Nurse) on 02/12/22 at 1035  Med List Status: <None>   Medication Order Taking? Sig Documenting Provider Last Dose Status Informant  albuterol (PROVENTIL HFA) 108 (90 Base) MCG/ACT inhaler [id number] Yes Inhale 2 puffs into the lungs every 6 (six) hours as needed for wheezing (cough). [name], MD Taking Active Self  amLODipine (NORVASC) 5 MG tablet [id number] No TAKE 1 TABLET BY MOUTH DAILY.  Patient not taking: Reported on 02/12/2022   [name], MD Not Taking Active            Med Note Thamas Jaegers, Jai Bear A   Mon Feb 12, 2022 10:31 AM) Planning to pick up this weekend  Blood Pressure Monitoring (BLOOD PRESSURE CUFF) MISC [id number] Yes Large cuff. ICD10: I10. Check blood pressure once daily. Please contact office if blood pressure above 150/90. [name], MD Taking Active   EPINEPHrine 0.3 mg/0.3 mL IJ SOAJ injection [id number] Yes Inject 0.3 mg into the muscle as needed for anaphylaxis. Althea Charon, FNP Taking Active Self           Med Note [name], [place] Aug 01, 2021  3:01 PM) Uses prn for anaphylaxis to  bananas and pineapple  fluticasone-salmeterol (ADVAIR HFA) 115-21 MCG/ACT inhaler [id number] Yes Inhale 2 puffs into the lungs 2 (two) times daily.  Patient taking differently: Inhale 2 puffs into the lungs daily.   [name], MD Taking Active Self           Med  Note [name], [name]   Tue Aug 01, 2021  3:02 PM) Teaching done with patient advising her to administer twice daily not once daily as she is currently doing  hydrochlorothiazide (MICROZIDE) 12.5 MG capsule [id number] No TAKE (1) CAPSULE BY MOUTH ONCE EVERY DAY.  Patient not taking: Reported on 02/12/2022   [name], MD Not Taking Active   Olopatadine HCl 0.2 % SOLN [id number] Yes Use 1 drop in each eye once a day as needed for itchy watery eyes Althea Charon, FNP Taking Active Self  potassium chloride SA (KLOR-CON M) 20 MEQ tablet [id number] Yes Take 1 tablet (20 mEq total) by mouth daily. Barton Dubois, MD Taking Active Self  propranolol (INDERAL) 20 MG tablet [id number] No Take 1 tablet (20 mg total) by mouth 2 (two) times daily.  Patient not taking: Reported on 02/12/2022   [name], MD Not Taking Active   Pyridoxine HCl (VITAMIN B6 PO) [id number] No Take by mouth.  Patient not taking: Reported on 02/12/2022   [provider] Not Taking Active   rOPINIRole (REQUIP) 1 MG tablet [id number] Yes Take 1 tablet (1 mg total) by mouth at bedtime. [name], MD Taking Active   SUMAtriptan (IMITREX) 100 MG tablet [id number] Yes Take 1 tablet (100 mg total) by mouth every 2 (two) hours as needed for migraine. May repeat in 2 hours if headache persists or recurs. [name], MD Taking Active    Patient not taking:   Discontinued 11/12/18 1700             Patient Active Problem List   Diagnosis Date Noted   Mass of upper outer quadrant of left breast 11/15/2021   Intractable migraine without aura and without status migrainosus 09/14/2021   Urge incontinence 09/14/2021   Hospital discharge follow-up 07/14/2021   Hydronephrosis 06/28/2021   Fibroadenoma of breast, left 03/31/2021   Restless legs 02/10/2021   Vitamin D deficiency 09/30/2020   Menorrhagia with irregular cycle 08/17/2020   Chronic right shoulder pain 07/01/2020   Mild persistent asthma, uncomplicated  01/65/5374   Abnormal uterine bleeding (AUB) 03/12/2017   Intramural leiomyoma of uterus 08/24/2015   Polyclonal gammopathy 07/26/2013   Dysplasia of cervix, unspecified 03/31/2013   Hypertension 09/21/2010   Morbid obesity ([place]) 11/03/2007    Conditions to be addressed/monitored per PCP order:  HTN, Asthma, and Kidney Stone  Care Plan : " RN Care Manager Plan Of Care  Updates made by [name], RN since 02/12/2022 12:00 AM     Problem: Knowledge Deficit and Care Coordination Needs Related to Management of  Asthma, HTN recurrent UTIs and kidney stones   Priority: High     Long-Range Goal: Development of Plan Of Care to Address Care Coordination Needs and Knowledge Deficits for Management of asthma, HTN, recurrent UTIs and kidney stones   Start Date: 08/01/2021  Expected End Date: 08/02/2022  Priority: High  Note:   Current Barriers:  Knowledge Deficits related to plan of care for management of HTN and asthma and recurrent UTIs with kidney stones  Care Coordination needs related to Financial constraints related to utility bills  Financial Constraints.  Insomnia RNCM Clinical  Goal(s):  Patient will verbalize understanding of plan for management of HTN, Asthma, and recurrent UTIs with kidney stones as evidenced by decreased incidences/reports of UTIs, no hospital admissions or ED visits related to complications of HTN, asthma or UTI/Kidney stones take all medications exactly as prescribed and will call provider for medication related questions as evidenced by patient reports of no missed medications    attend all scheduled medical appointments: with medical providers as evidenced by absence of no shows for medical appointments        demonstrate improved adherence to prescribed treatment plan for HTN, Asthma, and recurrent UTIs with kidney stones as evidenced by decreased incidences/reports of UTIs, no hospital admissions or ED related to complications of HTN, Asthma, UTIs or kidney  stones continue to work with RN Care Manager and/or Social Worker to address care management and care coordination needs related to HTN, Asthma, and recurrent UTIs and kidney stones as evidenced by adherence to CM Team Scheduled appointments     work with [name] care guide to address needs related to Financial constraints related to paying utility bills as evidenced by patient and/or community resource care guide support    through collaboration with Consulting civil engineer, provider, and care team.   Interventions: Inter-disciplinary care team collaboration (see longitudinal plan of care) Evaluation of current treatment plan related to  self management and patient's adherence to plan as established by provider    Interdisciplinary Collaboration Interventions:  (Status: Goal on track:  NO.) Long Term Goal  - Patient unable to afford medication copay. She reports needing assistance with utilities. Collaborated with BSW to initiate plan of care to address needs related to Financial constraints related to payment of household bills  in patient with HTN, Asthma, and recurrent UTIs with kidney stones -telephone outreach scheduled for 02/15/22 @ 1:30pm Inter-disciplinary care team collaboration   Advised patient to contact [employer] for member benefits(free fresh fruits and vegetables, Asthma items and assistance with Utilities) Advised patient to check on medication copay waiver at her local Social Services office    Asthma: (Status:Goal Met.) Long Term Goal- Advised patient to track and manage Asthma triggers Provided instruction about proper use of medications used for management of Asthma including inhalers Advised patient to self assesses Asthma action plan zone and make appointment with provider if in the yellow zone for 48 hours without improvement Provided education about and advised patient to utilize infection prevention strategies to reduce risk of respiratory  infection Discussed the importance of adequate rest and management of fatigue with Asthma Advised patient to call for refill on Emergency inhaler prior to running out    Recurrent UTIs and Kidney Stones - New left side pain, consistent with previous kidney stone pain (Status: Goal on Track (progressing): YES.) Long Term Goal - patient states she continues to have dysuria and left flank pain that she treats with OTC Tylenol, Urology follow up and ultrasound need scheduling  Evaluation of current treatment plan related to  recurrent UTIs and kidney stones ,  self-management and patient's adherence to plan as established by provider. Discussed plans with patient for ongoing care management follow up and provided patient with direct contact information for care management team Advised patient to attend Urology appointment today Reviewed upcoming Urology appointments: 02/12/22, Renal Ultrasound on 02/26/22 and Urology visit 03/05/22 Advised patient to increase fluid intake  Hypertension: (Status: Goal on Track (progressing): YES.) Long Term Goal - Patient checking BP every other day, recent reading 140/90  Last practice recorded BP readings:  BP Readings from Last 3 Encounters:  01/15/22 138/88  11/15/21 (!) 135/94  09/14/21 118/82    Most recent eGFR/CrCl:  Lab Results  Component Value Date   EGFR 116 08/15/2021    No components found for: "CRCL"  Evaluation of current treatment plan related to hypertension self management and patient's adherence to plan as established by provider;   Reviewed medications with patient and discussed importance of compliance;  Reviewed scheduled/upcoming provider appointments including:  Provided education on prescribed diet DASH;  Advised patient to contact [place] to inquire about a charge account Advised patient to go to [employer] to apply to have medication copay waived Advised patient to request all medications to have 90 day  refill    Patient Goals/Self-Care Activities: Take medications as prescribed   Attend all scheduled provider appointments Call pharmacy for medication refills 3-7 days in advance of running out of medications Perform all self care activities independently  Perform IADL's (shopping, preparing meals, housekeeping, managing finances) independently Call provider office for new concerns or questions  Work with the social worker to address care coordination needs and will continue to work with the clinical team to address health care and disease management related needs check blood pressure weekly write blood pressure results in a log or diary learn about high blood pressure call doctor for signs and symptoms of high blood pressure        Follow Up:  Patient agrees to Care Plan and Follow-up.  Plan: The Managed Medicaid care management team will reach out to the patient again over the next 30 days.  Date/time of next scheduled RN care management/care coordination outreach:  03/19/22 @ 10:30am  [name] RN, BSN [place] RN Care Coordinator

## 2022-02-12 NOTE — Progress Notes (Signed)
Assessment: 1. Left nephrolithiasis - Urinalysis, Routine w reflex microscopic - BLADDER SCAN AMB NON-IMAGING - CT RENAL STONE STUDY  2. Flank pain - CT RENAL STONE STUDY    Plan: Treatment options and imaging studies discussed at length with patient and her mother.  In light of her recent history and significant discomfort, will proceed with stat CT stone study.  Prescriptions for Flomax Robaxin and Mobic given.  Will call patient with stone study results and further plan.  She will increase her fluid intake.  ED precautions discussed.  Meds ordered this encounter  Medications   tamsulosin (FLOMAX) 0.4 MG CAPS capsule    Sig: Take 1 capsule (0.4 mg total) by mouth daily.    Dispense:  30 capsule    Refill:  0   methocarbamol (ROBAXIN) 500 MG tablet    Sig: Take 1 tablet (500 mg total) by mouth 4 (four) times daily.    Dispense:  20 tablet    Refill:  0   meloxicam (MOBIC) 15 MG tablet    Sig: Take 1 tablet (15 mg total) by mouth daily.    Dispense:  15 tablet    Refill:  0     Chief Complaint: No chief complaint on file.   HPI: [name] is a 41 y.o. female with h/o left sided nephrolithiasis who presents for evaluation of recurrence of left sided severe left flank pain for the past 2 days.  Patient states she is been doing well since stent placement in stone manipulation last March until a few days ago.  Her left flank pain is 7 out of 10, occasionally worse, and is in same location as when she had obstructing UVJ stone in the past.  She denies injury.  Pain is constant, waxing and waning with intensity.  She has had some nausea but no vomiting.  No fever, chills.  Pain radiates from the flank lateral abdomen.  She denies creasing frequency, urgency.  No gross hematuria.  Renal ultrasound scheduled later this month.  Following last stone procedure, 1 stone remained in the upper pole of the left kidney.  UA=clear PVR=73m   08/04/21 [name] is a 41yo here for followup for nephrolithiasis and back pain. She has been doing well since ureteroscopy and stent removal on 07/27/21. She denies any worsening LUTS. No hematuria. She denies flank pain. No stone events since last visit. She has chronic low back pain that is worse with activity. No exacerbating/alleviating events.   Portions of the above documentation were copied from a prior visit for review purposes only.  Allergies: Allergies  Allergen Reactions   Banana Rash   Pineapple Rash    PMH: Past Medical History:  Diagnosis Date   [place] 08[place]17[place]20[place]   Allergy    banana/pineapple   Anemia    post pregnancy   Anxiety    Asthma    Autoimmune disease, not elsewhere classified(279.49) 2013   Non specific- [facility], abnormal Bone Marrow signal   Breast lump in female 11/28/2012   Has tender round mass at 10-11 0'clock right breast 4 finger breaths from [place] will [place]  Breast mass, right 08/18/2012   Chlamydia    Chronic breast pain 05/31/2015   Dysmenorrhea 03/23/2014   Dyspareunia 01/05/2014   Elevated prolactin level 02/10/2021   Was 54.5, recheck in December ________   Fibroids, intramural 08/24/2015   GERD (gastroesophageal reflux disease)    Gonorrhea    Headache(784.0)  History of abnormal cervical Pap smear 04/02/2014   Hypertension    Kidney stone    06/2021   LLQ abdominal tenderness 05/31/2015   Menorrhagia 01/05/2014   Neuromuscular disorder (HCC)    lower back and bilat leg pain   Obesity    Other and unspecified ovarian cyst 01/12/2014   Polyclonal gammopathy 07/26/2013   Insignificant   Pyelonephritis 06/28/2021   Recurrent upper respiratory infection (URI)    Scoliosis    Urticaria    Vaginal irritation 09/30/2014   Vaginal Pap smear, abnormal     PSH: Past Surgical History:  Procedure Laterality Date   ADENOIDECTOMY     BREAST BIOPSY Left 02/2021   CYSTOSCOPY W/ URETERAL STENT PLACEMENT Left 06/29/2021    Procedure: CYSTOSCOPY WITH RETROGRADE PYELOGRAM/URETERAL STENT PLACEMENT;  Surgeon: Cleon Gustin, MD;  Location: [facility];  Service: Urology;  Laterality: Left;   CYSTOSCOPY WITH RETROGRADE PYELOGRAM, URETEROSCOPY AND STENT PLACEMENT Left 07/27/2021   Procedure: CYSTOSCOPY WITH RETROGRADE PYELOGRAM, URETEROSCOPY, STONE EXTRACTION WITH BASKET;  Surgeon: Cleon Gustin, MD;  Location: [facility];  Service: Urology;  Laterality: Left;   DILATATION AND CURETTAGE/HYSTEROSCOPY WITH MINERVA N/A 03/14/2021   Procedure: HYSTEROSCOPY WITH MINERVA;  Surgeon: Florian Buff, MD;  Location: [facility];  Service: Gynecology;  Laterality: N/A;   DILATION AND CURETTAGE OF UTERUS     LAPAROSCOPIC BILATERAL SALPINGECTOMY Bilateral 03/16/2020   Procedure: LAPAROSCOPIC BILATERAL SALPINGECTOMY;  Surgeon: Florian Buff, MD;  Location: [facility];  Service: Gynecology;  Laterality: Bilateral;   SHOULDER SURGERY Right 03/10/2019   TONSILLECTOMY     TUBAL LIGATION N/A    [employer] 06/28/2020    SH: Social History   Tobacco Use   Smoking status: Never   Smokeless tobacco: Never  Vaping Use   Vaping Use: Never used  Substance Use Topics   Alcohol use: No   Drug use: No    ROS: All other review of systems were reviewed and are negative except what is noted above in HPI  PE: BP (!) 147/88   Pulse 80   Wt 270 lb (122.5 kg)   BMI 44.93 kg/m  GENERAL APPEARANCE:  Well appearing, well developed, well nourished, NAD HEENT:  Atraumatic, normocephalic NECK:  Supple. Trachea midline ABDOMEN:  Soft, non-tender, no masses, rebound, guarding. EXTREMITIES:  Moves all extremities well, without clubbing, cyanosis, or edema NEUROLOGIC:  Alert and oriented x 3, normal gait, CN II-XII grossly intact MENTAL STATUS:  appropriate BACK: Spinal processes nontender.  Significant tenderness noted left CVA and left flank diffusely. SKIN:  Warm, dry, and intact   Results: Laboratory Data: Lab Results  Component Value Date    WBC 9.0 07/10/2021   HGB 12.4 07/10/2021   HCT 38.4 07/10/2021   MCV 91.4 07/10/2021   PLT 373 07/10/2021    Lab Results  Component Value Date   CREATININE 0.64 08/15/2021    Lab Results  Component Value Date   HGBA1C 5.4 09/28/2020    Urinalysis    Component Value Date/Time   COLORURINE AMBER (A) 07/10/2021 0936   APPEARANCEUR Clear 09/11/2021 1043   LABSPEC 1.020 07/10/2021 0936   PHURINE 7.0 07/10/2021 0936   GLUCOSEU Negative 09/11/2021 1043   HGBUR LARGE (A) 07/10/2021 0936   HGBUR small 02/03/2008 1438   BILIRUBINUR Negative 09/11/2021 1043   KETONESUR 5 (A) 07/10/2021 0936   PROTEINUR Negative 09/11/2021 1043   PROTEINUR >=300 (A) 07/10/2021 0936   UROBILINOGEN 1.0 01/01/2021 1149   UROBILINOGEN 1.0 12/29/2011 2254  NITRITE Negative 09/11/2021 1043   NITRITE NEGATIVE 07/10/2021 0936   LEUKOCYTESUR Negative 09/11/2021 1043   LEUKOCYTESUR SMALL (A) 07/10/2021 0936    Lab Results  Component Value Date   LABMICR See below: 09/11/2021   WBCUA None seen 09/11/2021   LABEPIT 0-10 09/11/2021   BACTERIA Few 09/11/2021    Pertinent Imaging: No results found for this or any previous visit.  No results found for this or any previous visit.  No results found for this or any previous visit.  No results found for this or any previous visit.  Results for orders placed during the hospital encounter of 09/01/21  Ultrasound renal complete  Narrative CLINICAL DATA:  Follow-up left nephrolithiasis  EXAM: RENAL / URINARY TRACT ULTRASOUND COMPLETE  COMPARISON:  Renal stone protocol 07/10/2021  FINDINGS: Right Kidney:  Renal measurements: 13.5 x 3.9 x 4.8 cm = volume: 132 mL. Echogenicity within normal limits. No mass or hydronephrosis visualized.  Left Kidney:  Renal measurements: 12.5 x 5.1 x 5.2 cm = volume: 173 mL. Echogenicity within normal limits. No mass or hydronephrosis visualized.  Bladder:  Appears normal for degree of bladder  distention.  Other:  None.  IMPRESSION: No sonographic evidence of renal calculi or hydronephrosis.   Electronically Signed By: Audie Pinto M.D. On: 09/02/2021 10:25  No valid procedures specified. No results found for this or any previous visit.  Results for orders placed during the hospital encounter of 07/10/21  CT Renal Stone Study  Narrative CLINICAL DATA:  Left-sided flank pain and hematuria.  EXAM: CT ABDOMEN AND PELVIS WITHOUT CONTRAST  TECHNIQUE: Multidetector CT imaging of the abdomen and pelvis was performed following the standard protocol without IV contrast.  RADIATION DOSE REDUCTION: This exam was performed according to the departmental dose-optimization program which includes automated exposure control, adjustment of the mA and/or kV according to patient size and/or use of iterative reconstruction technique.  COMPARISON:  CT abdomen pelvis dated June 28, 2021.  FINDINGS: Lower chest: No acute abnormality.  Hepatobiliary: No focal liver abnormality is seen. No gallstones, gallbladder wall thickening, or biliary dilatation.  Pancreas: Unremarkable. No pancreatic ductal dilatation or surrounding inflammatory changes.  Spleen: Normal in size without focal abnormality.  Adrenals/Urinary Tract: Adrenal glands and right kidney are unremarkable. Unchanged 5 mm calculus in the distal left ureter just proximal to the UVJ. New double-J left ureteral stent in appropriate position with improved now minimal to mild left hydroureteronephrosis. Improved left perinephric and periureteral fat stranding. Unchanged punctate calculus in the upper pole of the left kidney. Bladder is unremarkable.  Stomach/Bowel: Stomach is within normal limits. Appendix appears normal. No evidence of bowel wall thickening, distention, or inflammatory changes.  Vascular/Lymphatic: No significant vascular findings are present. No enlarged abdominal or pelvic lymph  nodes.  Reproductive: Uterus and bilateral adnexa are unremarkable.  Other: Similar trace free fluid in the pelvis is likely physiologic. No pneumoperitoneum.  Musculoskeletal: No acute or significant osseous findings.  IMPRESSION: 1. Unchanged 5 mm calculus in the distal left ureter with improved now minimal to mild left hydroureteronephrosis status post left ureteral stent placement. 2. Unchanged punctate left nephrolithiasis.   Electronically Signed By: Titus Dubin M.D. On: 07/10/2021 10:49  Results for orders placed or performed in visit on 02/12/22 (from the past 24 hour(s))  BLADDER SCAN AMB NON-IMAGING   Collection Time: 02/12/22  1:57 PM  Result Value Ref Range   Scan Result 0

## 2022-02-12 NOTE — Patient Instructions (Signed)
Visit Information  [name] was given information about Medicaid Managed Care team care coordination services as a part of their Healthy [facility] Medicaid benefit. [name] verbally consented to engagement with the [facility] Managed Care team.   If you are experiencing a medical emergency, please call 911 or report to your local emergency department or urgent care.   If you have a non-emergency medical problem during routine business hours, please contact your provider's office and ask to speak with a nurse.   For questions related to your Healthy [facility] health plan, please call: [phone] or visit the homepage here: [email or web address]  If you would like to schedule transportation through your Healthy [facility] plan, please call the following number at least 2 days in advance of your appointment: [phone]  For information about your ride after you set it up, call Ride Assist at [phone]. Use this number to activate a Will Call pickup, or if your transportation is late for a scheduled pickup. Use this number, too, if you need to make a change or cancel a previously scheduled reservation.  If you need transportation services right away, call [phone]. The after-hours call center is staffed 24 hours to handle ride assistance and urgent reservation requests (including discharges) 365 days a year. Urgent trips include sick visits, hospital discharge requests and life-sustaining treatment.  Call the [place] at [phone], at any time, 24 hours a day, 7 days a week. If you are in danger or need immediate medical attention call 911.  If you would like help to quit smoking, call 1-800-QUIT-NOW 919-071-0477) OR Espaol: 1-855-Djelo-Ya (8-850-277-4128) o para ms informacin haga clic aqu or Text READY to 200-400 to register via text  [name],   Please see education materials related to HTN  and Kidney Stone provided by [employer] link.  Patient verbalizes understanding of instructions and care plan provided today and agrees to view in [place]. Active MyChart status and patient understanding of how to access instructions and care plan via MyChart confirmed with patient.     Telephone follow up appointment with Managed Medicaid care management team member scheduled for:03/19/22 @ 10:30am  Mary Joiner RN, BSN [place] RN Care Coordinator   Following is a copy of your plan of care:  Care Plan : " [place]  Updates made by Mary Montane, RN since 02/12/2022 12:00 AM     Problem: Knowledge Deficit and Care Coordination Needs Related to Management of  Asthma, HTN recurrent UTIs and kidney stones   Priority: High     Long-Range Goal: Development of Plan Of Care to Address Care Coordination Needs and Knowledge Deficits for Management of asthma, HTN, recurrent UTIs and kidney stones   Start Date: 08/01/2021  Expected End Date: 08/02/2022  Priority: High  Note:   Current Barriers:  Knowledge Deficits related to plan of care for management of HTN and asthma and recurrent UTIs with kidney stones  Care Coordination needs related to Financial constraints related to utility bills  Film/video editor.  Insomnia RNCM Clinical Goal(s):  Patient will verbalize understanding of plan for management of HTN, Asthma, and recurrent UTIs with kidney stones as evidenced by decreased incidences/reports of UTIs, no hospital admissions or ED visits related to complications of HTN, asthma or UTI/Kidney stones take all medications exactly as prescribed and will call provider for medication related questions as evidenced by patient reports of no missed medications    attend all  scheduled medical appointments: with medical providers as evidenced by absence of no shows for medical appointments        demonstrate improved adherence to prescribed treatment  plan for HTN, Asthma, and recurrent UTIs with kidney stones as evidenced by decreased incidences/reports of UTIs, no hospital admissions or ED related to complications of HTN, Asthma, UTIs or kidney stones continue to work with RN Care Manager and/or Social Worker to address care management and care coordination needs related to HTN, Asthma, and recurrent UTIs and kidney stones as evidenced by adherence to CM Team Scheduled appointments     work with [name] care guide to address needs related to Financial constraints related to paying utility bills as evidenced by patient and/or community resource care guide support    through collaboration with Consulting civil engineer, provider, and care team.   Interventions: Inter-disciplinary care team collaboration (see longitudinal plan of care) Evaluation of current treatment plan related to  self management and patient's adherence to plan as established by provider    Interdisciplinary Collaboration Interventions:  (Status: Goal on track:  NO.) Long Term Goal  - Patient unable to afford medication copay. She reports needing assistance with utilities. Collaborated with BSW to initiate plan of care to address needs related to Financial constraints related to payment of household bills  in patient with HTN, Asthma, and recurrent UTIs with kidney stones -telephone outreach scheduled for 02/15/22 @ 1:30pm Inter-disciplinary care team collaboration   Advised patient to contact Healthy [employer] for member benefits(free fresh fruits and vegetables, Asthma items and assistance with Utilities) Advised patient to check on medication copay waiver at her local Social Services office    Asthma: (Status:Goal Met.) Long Term Goal- Advised patient to track and manage Asthma triggers Provided instruction about proper use of medications used for management of Asthma including inhalers Advised patient to self assesses Asthma action plan zone and make  appointment with provider if in the yellow zone for 48 hours without improvement Provided education about and advised patient to utilize infection prevention strategies to reduce risk of respiratory infection Discussed the importance of adequate rest and management of fatigue with Asthma Advised patient to call for refill on Emergency inhaler prior to running out    Recurrent UTIs and Kidney Stones - New left side pain, consistent with previous kidney stone pain (Status: Goal on Track (progressing): YES.) Long Term Goal - patient states she continues to have dysuria and left flank pain that she treats with OTC Tylenol, Urology follow up and ultrasound need scheduling  Evaluation of current treatment plan related to  recurrent UTIs and kidney stones ,  self-management and patient's adherence to plan as established by provider. Discussed plans with patient for ongoing care management follow up and provided patient with direct contact information for care management team Advised patient to attend Urology appointment today Reviewed upcoming Urology appointments: 02/12/22, Renal Ultrasound on 02/26/22 and Urology visit 03/05/22 Advised patient to increase fluid intake  Hypertension: (Status: Goal on Track (progressing): YES.) Long Term Goal - Patient checking BP every other day, recent reading 140/90  Last practice recorded BP readings:  BP Readings from Last 3 Encounters:  01/15/22 138/88  11/15/21 (!) 135/94  09/14/21 118/82    Most recent eGFR/CrCl:  Lab Results  Component Value Date   EGFR 116 08/15/2021    No components found for: "CRCL"  Evaluation of current treatment plan related to hypertension self management and patient's adherence to plan as established by provider;  Reviewed medications with patient and discussed importance of compliance;  Reviewed scheduled/upcoming provider appointments including:  Provided education on prescribed diet DASH;  Advised patient to contact [employer] to inquire about a charge account Advised patient to go to [employer] to apply to have medication copay waived Advised patient to request all medications to have 90 day refill    Patient Goals/Self-Care Activities: Take medications as prescribed   Attend all scheduled provider appointments Call pharmacy for medication refills 3-7 days in advance of running out of medications Perform all self care activities independently  Perform IADL's (shopping, preparing meals, housekeeping, managing finances) independently Call provider office for new concerns or questions  Work with the social worker to address care coordination needs and will continue to work with the clinical team to address health care and disease management related needs check blood pressure weekly write blood pressure results in a log or diary learn about high blood pressure call doctor for signs and symptoms of high blood pressure

## 2022-02-15 ENCOUNTER — Other Ambulatory Visit: Payer: Self-pay

## 2022-02-15 NOTE — Patient Outreach (Signed)
Care Coordination  02/15/2022  [name] [birth year] [id number]    Medicaid Managed Care   Unsuccessful Outreach Note  02/15/2022 Name: [name] MRN: [id number] DOB: 09/24/1980  Referred by: [name], MD Reason for referral : High Risk Managed Medicaid (MM Social Work [employer])   An unsuccessful telephone outreach was attempted today. The patient was referred to the case management team for assistance with care management and care coordination.   Follow Up Plan: The care management team will reach out to the patient again over the next 7 days.   [name], BSW, [place] Managed Medicaid Team  [phone]

## 2022-02-15 NOTE — Patient Instructions (Signed)
Visit Information  Ms. LAQUANNA VEAZEY  - as a part of your Medicaid benefit, you are eligible for care management and care coordination services at no cost or copay. I was unable to reach you by phone today but would be happy to help you with your health related needs. Please feel free to call me @ [phone].   A member of the Managed Medicaid care management team will reach out to you again over the next 7 days.   [name], BSW, [place] Managed Medicaid Team  [phone]

## 2022-02-26 ENCOUNTER — Ambulatory Visit (HOSPITAL_COMMUNITY): Payer: Medicaid Other | Attending: Physician Assistant

## 2022-03-05 ENCOUNTER — Ambulatory Visit: Payer: Medicaid Other | Admitting: Physician Assistant

## 2022-03-07 ENCOUNTER — Ambulatory Visit: Payer: Medicaid Other | Admitting: Orthopedic Surgery

## 2022-03-07 ENCOUNTER — Encounter: Payer: Self-pay | Admitting: Orthopedic Surgery

## 2022-03-07 ENCOUNTER — Ambulatory Visit (INDEPENDENT_AMBULATORY_CARE_PROVIDER_SITE_OTHER): Payer: Medicaid Other

## 2022-03-07 VITALS — BP 155/106 | HR 98 | Ht 65.0 in | Wt 270.0 lb

## 2022-03-07 DIAGNOSIS — M7551 Bursitis of right shoulder: Secondary | ICD-10-CM | POA: Diagnosis not present

## 2022-03-07 DIAGNOSIS — G8929 Other chronic pain: Secondary | ICD-10-CM

## 2022-03-07 DIAGNOSIS — M25511 Pain in right shoulder: Secondary | ICD-10-CM

## 2022-03-07 DIAGNOSIS — Z9889 Other specified postprocedural states: Secondary | ICD-10-CM | POA: Diagnosis not present

## 2022-03-07 MED ORDER — METHYLPREDNISOLONE ACETATE 40 MG/ML IJ SUSP
40.0000 mg | Freq: Once | INTRAMUSCULAR | Status: AC
  Administered 2022-03-07: 40 mg via INTRA_ARTICULAR

## 2022-03-07 NOTE — Progress Notes (Signed)
Chief Complaint  Patient presents with   Shoulder Pain    Right for about a year     HPI: 41 year old female injured at work back in 2020.  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  She was working with the patient and the patient was falling and she tried to catch her and something popped in her shoulder.  She eventually had surgery on March 10, 2019 by Dr. Tamera Punt she had a right shoulder arthroscopy debridement of a labral tear partial subscapularis tear and partial supraspinatus tear along with an acromioplasty.  She said she got good relief for approximately 1 year  Over the last year she has noted increased pain with activity pain at night and a constant throbbing during the day  She is currently taking Tylenol for pain because she has had some issues with creatinine and was told not to take ibuprofen  She has pain along the anterolateral shoulder with a constant throbbing pain with increased pain with attempts at forward elevation and decreased range of motion  She does not have any numbness or tingling in the arm and no neck pain  Past Medical History:  Diagnosis Date   AKI (acute kidney injury) ([place]) 08/17/2021   Allergy    banana/pineapple   Anemia    post pregnancy   Anxiety    Asthma    Autoimmune disease, not elsewhere classified(279.49) 2013   Non specific- [facility], abnormal Bone Marrow signal   Breast lump in female 11/28/2012   Has tender round mass at 10-11 0'clock right breast 4 finger breaths from [place] will US   Breast mass, right 08/18/2012   Chlamydia    Chronic breast pain 05/31/2015   Dysmenorrhea 03/23/2014   Dyspareunia 01/05/2014   Elevated prolactin level 02/10/2021   Was 54.5, recheck in December ________   Fibroids, intramural 08/24/2015   GERD (gastroesophageal reflux disease)    Gonorrhea    Headache(784.0)    History of abnormal cervical Pap smear 04/02/2014   Hypertension    Kidney stone    06/2021   LLQ abdominal tenderness  05/31/2015   Menorrhagia 01/05/2014   Neuromuscular disorder (HCC)    lower back and bilat leg pain   Obesity    Other and unspecified ovarian cyst 01/12/2014   Polyclonal gammopathy 07/26/2013   Insignificant   Pyelonephritis 06/28/2021   Recurrent upper respiratory infection (URI)    Scoliosis    Urticaria    Vaginal irritation 09/30/2014   Vaginal Pap smear, abnormal     BP (!) 155/106   Pulse 98   Ht '5\' 5"'$  (1.651 m)   Wt 270 lb (122.5 kg)   BMI 44.93 kg/m    General appearance: Well-developed well-nourished no gross deformities  Cardiovascular normal pulse and perfusion normal color without edema  Neurologically no sensation loss or deficits or pathologic reflexes  Psychological: Awake alert and oriented x3 mood and affect normal  Skin no lacerations or ulcerations no nodularity no palpable masses, no erythema or nodularity  Musculoskeletal:   The patient meets the AMA guidelines for Morbid (severe) obesity with a BMI > 40.0 and I have recommended weight loss.  Right shoulder shows tenderness globally around the shoulder posteriorly laterally anteriorly.  She has pain with all ranges of motion and her flexion and abduction are limited to 90 degrees before the pain is inhibitive.  She has normal external rotation with the arm at the side.  Muscle function testing was limited because of pain.  Imaging internal imaging was performed.  3 views of the right shoulder show no fracture dislocation or hardware.    A/P  I was able to look at her operative note she had a 10% tear of the subscapularis superior fibers which was debrided she had an anterior labral tear which was debrided it was a bucket-handle tear was from 1-4 o'clock.  She also had a supraspinatus tear which was 20% top to bottom was also debrided she had acromioplasty bursectomy  I reviewed the remaining packet of notes at a later date and incorporate this by reference and add a addendum later  It appears  that she has a bursitis cannot tell if she has a recurrence of the labral tear or cuff tear  Subacromial injection since we cannot use anti-inflammatory   Procedure note the subacromial injection shoulder RIGHT    Verbal consent was obtained to inject the  RIGHT   Shoulder  Timeout was completed to confirm the injection site is a subacromial space of the  RIGHT  shoulder   Medication used Depo-Medrol 40 mg and lidocaine 1% 3 cc  Anesthesia was provided by ethyl chloride  The injection was performed in the RIGHT  posterior subacromial space. After pinning the skin with alcohol and anesthetized the skin with ethyl chloride the subacromial space was injected using a 20-gauge needle. There were no complications  Sterile dressing was applied.

## 2022-03-17 ENCOUNTER — Telehealth: Payer: Medicaid Other | Admitting: Nurse Practitioner

## 2022-03-17 DIAGNOSIS — B3731 Acute candidiasis of vulva and vagina: Secondary | ICD-10-CM | POA: Diagnosis not present

## 2022-03-17 MED ORDER — FLUCONAZOLE 150 MG PO TABS: 150.0000 mg | ORAL_TABLET | Freq: Once | ORAL | 0 refills | Status: AC

## 2022-03-17 NOTE — Progress Notes (Signed)

## 2022-03-19 ENCOUNTER — Encounter: Payer: Self-pay | Admitting: *Deleted

## 2022-03-19 ENCOUNTER — Other Ambulatory Visit: Payer: Medicaid Other | Admitting: *Deleted

## 2022-03-19 NOTE — Patient Outreach (Signed)
Medicaid Managed Care   Nurse Care Manager Note  03/19/2022 Name:  [name] MRN:  [id number] DOB:  08/10/1980  [name] is an 41 y.o. year old female who is a primary patient of [name], MD.  The [facility] Managed Care Coordination team was consulted for assistance with:    HTN Kidney Stone  Ms. Gorney was given information about Medicaid Managed Care Coordination team services today. [name] Patient agreed to services and verbal consent obtained.  Engaged with patient by telephone for follow up visit in response to provider referral for case management and/or care coordination services.   Assessments/Interventions:  Review of past medical history, allergies, medications, health status, including review of consultants reports, laboratory and other test data, was performed as part of comprehensive evaluation and provision of chronic care management services.  SDOH (Social Determinants of Health) assessments and interventions performed: SDOH Interventions    Flowsheet Row Patient Outreach Telephone from 03/19/2022 in [place] Patient Outreach Telephone from 02/12/2022 in [place] Patient Outreach Telephone from 10/06/2021 in [place] Patient Outreach Telephone from 09/01/2021 in [place] Telephone from 08/03/2021 in [place] Patient Outreach Telephone from 08/01/2021 in [place] Interventions        Food Insecurity Interventions -- Intervention Not Indicated -- -- -- Intervention Not Indicated  Housing Interventions Other (Comment)  [Rescheduled with BSW for resources] -- Intervention Not Indicated -- -- Intervention Not Indicated  Transportation Interventions -- Intervention Not Indicated  Intervention Not Indicated -- -- Intervention Not Indicated  [reviewed transportation benefit of managed Medicaid health plan]  Financial Strain Interventions -- --  [Referred to MM BSW, scheduled for 02/15/22 @ 1:30pm] -- -- [id number] Referral Other (Comment)  [referral to MM  BSW and community care guide]  Stress Interventions -- -- -- Patient Refused  [patient states she is currently going through a divorce , offered the services of LCSW. patient states she does not feel she needs the services at this time] -- --  Social Connections Interventions -- -- -- Intervention Not Indicated -- --       Care Plan  Allergies  Allergen Reactions   Banana Rash   Pineapple Rash    Medications Reviewed Today     Reviewed by [name], RN (Registered Nurse) on 03/19/22 at 1045  Med List Status: <None>   Medication Order Taking? Sig Documenting Provider Last Dose Status Informant  albuterol (PROVENTIL HFA) 108 (90 Base) MCG/ACT inhaler [id number] Yes Inhale 2 puffs into the lungs every 6 (six) hours as needed for wheezing (cough). [name], MD Taking Active Self  amLODipine (NORVASC) 5 MG tablet [id number] Yes TAKE 1 TABLET BY MOUTH DAILY. [name], MD Taking Active            Med Note Thamas Jaegers, [place] Strader A   Mon Feb 12, 2022 10:31 AM) Planning to pick up this weekend  Blood Pressure Monitoring (BLOOD PRESSURE CUFF) MISC [id number] Yes Large cuff. ICD10: I10. Check blood pressure once daily. Please contact office if blood pressure above 150/90. [name], MD Taking Active   EPINEPHrine 0.3 mg/0.3 mL IJ SOAJ injection [id number] Yes Inject 0.3 mg into the muscle as needed for anaphylaxis. 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  Patient taking  differently: Inhale 2 puffs into the lungs daily.   [name], MD Taking Active Self           Med Note [name], [name]   Tue Aug 01, 2021  3:02 PM) Teaching done with patient advising her to administer twice daily not once daily as she is currently doing  hydrochlorothiazide (MICROZIDE) 12.5 MG capsule [id number] Yes TAKE (1) CAPSULE BY MOUTH ONCE EVERY DAY. [name], MD Taking Active   meloxicam [facility]) 15 MG tablet [id number] No Take 1 tablet (15 mg total) by mouth daily.  Patient not taking: Reported on 03/07/2022   [place], Mary Heinrich, PA-C Not Taking Active   methocarbamol (ROBAXIN) 500 MG tablet [id number] No Take 1 tablet (500 mg total) by mouth 4 (four) times daily.  Patient not taking: Reported on 03/07/2022   [place], [name], [place] Not Taking Active   Olopatadine HCl 0.2 % SOLN [id number] Yes Use 1 drop in each eye once a day as needed for itchy watery eyes Althea Charon, FNP Taking Active Self  potassium chloride SA (KLOR-CON M) 20 MEQ tablet [id number] Yes Take 1 tablet (20 mEq total) by mouth daily. Barton Dubois, MD Taking Active Self  propranolol (INDERAL) 20 MG tablet [id number] Yes Take 1 tablet (20 mg total) by mouth 2 (two) times daily. [name], MD Taking Active   Pyridoxine HCl (VITAMIN B6 PO) [id number] No Take by mouth.  Patient not taking: Reported on 03/19/2022   [provider] Not Taking Active   rOPINIRole (REQUIP) 1 MG tablet [id number] Yes Take 1 tablet (1 mg total) by mouth at bedtime. [name], MD Taking Active   SUMAtriptan (IMITREX) 100 MG tablet [id number] Yes Take 1 tablet (100 mg total) by mouth every 2 (two) hours as needed for migraine. May repeat in 2 hours if headache persists or recurs. [name], MD Taking Active   tamsulosin [facility]) 0.4 MG CAPS capsule [id number] Yes Take 1 capsule (0.4 mg total) by mouth daily. [place], [name], [place] Taking Active    Patient not taking:   Discontinued  11/12/18 1700             Patient Active Problem List   Diagnosis Date Noted   Mass of upper outer quadrant of left breast 11/15/2021   Intractable migraine without aura and without status migrainosus 09/14/2021   Urge incontinence 09/14/2021   Hospital discharge follow-up 07/14/2021   Hydronephrosis 06/28/2021   Fibroadenoma of breast, left 03/31/2021   Restless legs 02/10/2021   Vitamin D deficiency 09/30/2020   Menorrhagia with irregular cycle 08/17/2020   Chronic right shoulder pain 07/01/2020   Mild persistent asthma, uncomplicated 47/12/2955   Abnormal uterine bleeding (AUB) 03/12/2017   Intramural leiomyoma of uterus 08/24/2015   Polyclonal gammopathy 07/26/2013   Dysplasia of cervix, unspecified 03/31/2013   Hypertension 09/21/2010   Morbid obesity ([place]) 11/03/2007    Conditions to be addressed/monitored per PCP order:  HTN and Kidney Stone  Care Plan : " RN Care Manager Plan Of Care  Updates made by [name], RN since 03/19/2022 12:00 AM     Problem: Knowledge Deficit and Care Coordination Needs Related to Management of  Asthma, HTN recurrent UTIs and kidney stones   Priority: High  Long-Range Goal: Development of Plan Of Care to Address Care Coordination Needs and Knowledge Deficits for Management of asthma, HTN, recurrent UTIs and kidney stones   Start Date: 08/01/2021  Expected End Date: 08/02/2022  Priority: High  Note:   Current Barriers:  Knowledge Deficits related to plan of care for management of HTN and asthma and recurrent UTIs with kidney stones  Care Coordination needs related to Financial constraints related to utility bills  Financial Constraints.  Insomnia RNCM Clinical Goal(s):  Patient will verbalize understanding of plan for management of HTN, Asthma, and recurrent UTIs with kidney stones as evidenced by decreased incidences/reports of UTIs, no hospital admissions or ED visits related to complications of HTN, asthma or UTI/Kidney  stones take all medications exactly as prescribed and will call provider for medication related questions as evidenced by patient reports of no missed medications    attend all scheduled medical appointments: with medical providers as evidenced by absence of no shows for medical appointments        demonstrate improved adherence to prescribed treatment plan for HTN, Asthma, and recurrent UTIs with kidney stones as evidenced by decreased incidences/reports of UTIs, no hospital admissions or ED related to complications of HTN, Asthma, UTIs or kidney stones continue to work with RN Care Manager and/or Social Worker to address care management and care coordination needs related to HTN, Asthma, and recurrent UTIs and kidney stones as evidenced by adherence to CM Team Scheduled appointments     work with [name] care guide to address needs related to Financial constraints related to paying utility bills as evidenced by patient and/or community resource care guide support    through collaboration with Consulting civil engineer, provider, and care team.   Interventions: Inter-disciplinary care team collaboration (see longitudinal plan of care) Evaluation of current treatment plan related to  self management and patient's adherence to plan as established by provider    Interdisciplinary Collaboration Interventions:  (Status: Goal on track:  Yes.) Long Term Goal  - Patient unable to afford medication copay. She reports needing assistance with utilities. Collaborated with BSW to initiate plan of care to address needs related to Financial constraints related to payment of household bills  in patient with HTN, Asthma, and recurrent UTIs with kidney stones -telephone outreach scheduled for 02/15/22 @ 1:30pm Inter-disciplinary care team collaboration   Advised patient to contact [employer] [phone] for member benefits(free fresh fruits and vegetables, Asthma items and assistance with  Utilities)-revisited, encouraged to call today Advised patient to check on medication copay waiver at her local Social Services office-Patient spoke with her Medicaid Worker and will follow up Rescheduled with BSW on 03/21/22 @ 11 am   Recurrent UTIs and Kidney Stones - New left side pain, consistent with previous kidney stone pain (Status: Goal on Track (progressing): YES.) Long Term Goal - patient states she continues to have dysuria and left flank pain that she treats with OTC Tylenol, Urology follow up and repeat ultrasound need scheduling  Evaluation of current treatment plan related to  recurrent UTIs and kidney stones ,  self-management and patient's adherence to plan as established by provider. Discussed plans with patient for ongoing care management follow up and provided patient with direct contact information for care management team Reviewed upcoming Urology appointments: schedule ultrasound and follow up with Urology Advised patient to increase fluid intake  Hypertension: (Status: Goal on Track (progressing): YES.) Long Term Goal - Patient checking BP every other day, recent reading 130/89  Last practice recorded BP readings:  BP Readings from Last 3 Encounters:  03/07/22 (!) 155/106  02/12/22 (!) 147/88  01/15/22 138/88    Most recent eGFR/CrCl:  Lab Results  Component Value Date   EGFR 116 08/15/2021    No components found for: "CRCL"  Evaluation of current treatment plan related to hypertension self management and patient's adherence to plan as established by provider;   Reviewed medications with patient and discussed importance of compliance;  Reviewed scheduled/upcoming provider appointments including:  Provided education on prescribed diet DASH;  Advised patient to follow up with Medicaid case worker for medication copay waiver update Provided education on HTN  Patient Goals/Self-Care Activities: Take medications as prescribed   Attend all scheduled provider  appointments Call pharmacy for medication refills 3-7 days in advance of running out of medications Perform all self care activities independently  Perform IADL's (shopping, preparing meals, housekeeping, managing finances) independently Call provider office for new concerns or questions  Work with the social worker to address care coordination needs and will continue to work with the clinical team to address health care and disease management related needs check blood pressure weekly write blood pressure results in a log or diary learn about high blood pressure call doctor for signs and symptoms of high blood pressure        Follow Up:  Patient agrees to Care Plan and Follow-up.  Plan: The Managed Medicaid care management team will reach out to the patient again over the next 30 days.  Date/time of next scheduled RN care management/care coordination outreach:  04/20/22 @ [place] RN, [place] RN Care Coordinator

## 2022-03-19 NOTE — Patient Instructions (Addendum)
Visit Information  Ms. Baldini was given information about Medicaid Managed Care team care coordination services as a part of their Healthy [facility] Medicaid benefit. Carrington Clamp Zaccaro verbally consented to engagement with the [facility] Managed Care team.   If you are experiencing a medical emergency, please call 911 or report to your local emergency department or urgent care.   If you have a non-emergency medical problem during routine business hours, please contact your provider's office and ask to speak with a nurse.   For questions related to your Healthy [facility] health plan, please call: [phone] or visit the homepage here: [email or web address]  If you would like to schedule transportation through your Healthy [facility] plan, please call the following number at least 2 days in advance of your appointment: [phone]  For information about your ride after you set it up, call Ride Assist at [phone]. Use this number to activate a Will Call pickup, or if your transportation is late for a scheduled pickup. Use this number, too, if you need to make a change or cancel a previously scheduled reservation.  If you need transportation services right away, call [phone]. The after-hours call center is staffed 24 hours to handle ride assistance and urgent reservation requests (including discharges) 365 days a year. Urgent trips include sick visits, hospital discharge requests and life-sustaining treatment.  Call the [place] at [phone], at any time, 24 hours a day, 7 days a week. If you are in danger or need immediate medical attention call 911.  If you would like help to quit smoking, call 1-800-QUIT-NOW 4040888763) OR Espaol: 1-855-Djelo-Ya (9-038-333-8329) o para ms informacin haga clic aqu or Text READY to 200-400 to register via text  Ms. [place],   Please see education materials related to HTN  provided by MyChart link.  Patient verbalizes understanding of instructions and care plan provided today and agrees to view in [place]. Active MyChart status and patient understanding of how to access instructions and care plan via MyChart confirmed with patient.     Telephone follow up appointment with Managed Medicaid care management team member scheduled for:04/20/22 @ [place] RN, BSN [place] RN Care Coordinator   Following is a copy of your plan of care:  Care Plan : " [place]  Updates made by [name], RN since 03/19/2022 12:00 AM     Problem: Knowledge Deficit and Care Coordination Needs Related to Management of  Asthma, HTN recurrent UTIs and kidney stones   Priority: High     Long-Range Goal: Development of Plan Of Care to Address Care Coordination Needs and Knowledge Deficits for Management of asthma, HTN, recurrent UTIs and kidney stones   Start Date: 08/01/2021  Expected End Date: 08/02/2022  Priority: High  Note:   Current Barriers:  Knowledge Deficits related to plan of care for management of HTN and asthma and recurrent UTIs with kidney stones  Care Coordination needs related to Financial constraints related to utility bills  Film/video editor.  Insomnia RNCM Clinical Goal(s):  Patient will verbalize understanding of plan for management of HTN, Asthma, and recurrent UTIs with kidney stones as evidenced by decreased incidences/reports of UTIs, no hospital admissions or ED visits related to complications of HTN, asthma or UTI/Kidney stones take all medications exactly as prescribed and will call provider for medication related questions as evidenced by patient reports of no missed medications    attend all scheduled medical appointments:  with medical providers as evidenced by absence of no shows for medical appointments        demonstrate improved adherence to prescribed treatment plan for HTN, Asthma,  and recurrent UTIs with kidney stones as evidenced by decreased incidences/reports of UTIs, no hospital admissions or ED related to complications of HTN, Asthma, UTIs or kidney stones continue to work with RN Care Manager and/or Social Worker to address care management and care coordination needs related to HTN, Asthma, and recurrent UTIs and kidney stones as evidenced by adherence to CM Team Scheduled appointments     work with [name] care guide to address needs related to Financial constraints related to paying utility bills as evidenced by patient and/or community resource care guide support    through collaboration with Consulting civil engineer, provider, and care team.   Interventions: Inter-disciplinary care team collaboration (see longitudinal plan of care) Evaluation of current treatment plan related to  self management and patient's adherence to plan as established by provider    Interdisciplinary Collaboration Interventions:  (Status: Goal on track:  Yes.) Long Term Goal  - Patient unable to afford medication copay. She reports needing assistance with utilities. Collaborated with BSW to initiate plan of care to address needs related to Financial constraints related to payment of household bills  in patient with HTN, Asthma, and recurrent UTIs with kidney stones -telephone outreach scheduled for 02/15/22 @ 1:30pm Inter-disciplinary care team collaboration   Advised patient to contact [employer] [phone] for member benefits(free fresh fruits and vegetables, Asthma items and assistance with Utilities)-revisited, encouraged to call today Advised patient to check on medication copay waiver at her local Social Services office-Patient spoke with her Medicaid Worker and will follow up Rescheduled with BSW on 03/21/22 @ 11 am   Recurrent UTIs and Kidney Stones - New left side pain, consistent with previous kidney stone pain (Status: Goal on Track (progressing): YES.) Long  Term Goal - patient states she continues to have dysuria and left flank pain that she treats with OTC Tylenol, Urology follow up and repeat ultrasound need scheduling  Evaluation of current treatment plan related to  recurrent UTIs and kidney stones ,  self-management and patient's adherence to plan as established by provider. Discussed plans with patient for ongoing care management follow up and provided patient with direct contact information for care management team Reviewed upcoming Urology appointments: schedule ultrasound and follow up with Urology Advised patient to increase fluid intake  Hypertension: (Status: Goal on Track (progressing): YES.) Long Term Goal - Patient checking BP every other day, recent reading 130/89  Last practice recorded BP readings:  BP Readings from Last 3 Encounters:  03/07/22 (!) 155/106  02/12/22 (!) 147/88  01/15/22 138/88    Most recent eGFR/CrCl:  Lab Results  Component Value Date   EGFR 116 08/15/2021    No components found for: "CRCL"  Evaluation of current treatment plan related to hypertension self management and patient's adherence to plan as established by provider;   Reviewed medications with patient and discussed importance of compliance;  Reviewed scheduled/upcoming provider appointments including:  Provided education on prescribed diet DASH;  Advised patient to follow up with Medicaid case worker for medication copay waiver update Provided education on HTN  Patient Goals/Self-Care Activities: Take medications as prescribed   Attend all scheduled provider appointments Call pharmacy for medication refills 3-7 days in advance of running out of medications Perform all self care activities independently  Perform IADL's (shopping, preparing meals, housekeeping, managing  finances) independently Call provider office for new concerns or questions  Work with the social worker to address care coordination needs and will continue to work with the  clinical team to address health care and disease management related needs check blood pressure weekly write blood pressure results in a log or diary learn about high blood pressure call doctor for signs and symptoms of high blood pressure

## 2022-03-21 ENCOUNTER — Other Ambulatory Visit: Payer: Medicaid Other

## 2022-03-21 NOTE — Patient Outreach (Signed)
Medicaid Managed Care Social Work Note  11/22/[place] Name:  [name] MRN:  [id number] DOB:  [birth year]  [name] is an 41 y.o. year old female who is [place] primary patient of Mary Spar, MD.  The Medicaid Managed Care Coordination team was consulted for assistance with:  Community Resources   [name] was given information about Medicaid Managed Care Coordination team services today. [name] Patient agreed to services and verbal consent obtained.  Engaged with patient  for by telephone forfollow up visit in response to referral for case management and/or care coordination services.   Assessments/Interventions:  Review of past medical history, allergies, medications, health status, including review of consultants reports, laboratory and other test data, was performed as part of comprehensive evaluation and provision of chronic care management services.  SDOH: (Social Determinant of Health) assessments and interventions performed: SDOH Interventions    Flowsheet Row Patient Outreach Telephone from 11/20/Kaufman in [place] Patient Outreach Telephone from 10/16/Kaufman in [place] Patient Outreach Telephone from 6/9/Kaufman in [place] Patient Outreach Telephone from 5/5/Kaufman in [place] Telephone from 4/6/Kaufman in [place] Patient Outreach Telephone from 4/4/Kaufman in [place] Coordination  SDOH Interventions        Food Insecurity Interventions -- Intervention Not Indicated -- -- -- Intervention Not Indicated  Housing Interventions Other (Comment)  [Rescheduled with BSW for resources] -- Intervention Not Indicated -- -- Intervention Not Indicated  Transportation Interventions -- Intervention Not Indicated Intervention Not  Indicated -- -- Intervention Not Indicated  [reviewed transportation benefit of managed Medicaid health plan]  Financial Strain Interventions -- --  [Referred to MM BSW, scheduled for 02/15/22 @ 1:30pm] -- -- [id number] Referral Other (Comment)  [referral to MM  BSW and community care guide]  Stress Interventions -- -- -- Patient Refused  [patient states she is currently going through [place] divorce , offered the services of LCSW. patient states she does not feel she needs the services at this time] -- --  Social Connections Interventions -- -- -- Intervention Not Indicated -- --     BSW completed [place] telephone outreach with patient for housing and utility assistance. Patient stated she does have [place] notice for rent, but not for utilities. BSW sent patient [place] list of resources for [facility] to [email or web address]. No other resources are needed at this time.   Advanced Directives Status:  Not addressed in this encounter.  Care Plan                 Allergies  Allergen Reactions   Banana Rash   Pineapple Rash    Medications Reviewed Today     Reviewed by Mary Montane, RN (Registered Nurse) on 03/19/22 at 1045  Med List Status: <None>   Medication Order Taking? Sig Documenting Provider Last Dose Status Informant  albuterol (PROVENTIL HFA) 108 (90 Base) MCG/ACT inhaler [id number] Yes Inhale 2 puffs into the lungs every 6 (six) hours as needed for wheezing (cough). Mary Spar, MD Taking Active Self  amLODipine (NORVASC) 5 MG tablet [id number] Yes TAKE 1 TABLET BY MOUTH DAILY. Mary Spar, MD Taking Active            Med Note [name], [name]   Mon Oct 16, [place] 10:31 AM) Planning to pick up this weekend  Blood Pressure Monitoring (BLOOD PRESSURE CUFF) [place] [id number] Yes  Large cuff. ICD10: I10. Check blood pressure once daily. Please contact office if blood pressure above 150/90. Mary Spar, MD Taking Active   EPINEPHrine 0.3 mg/0.3 mL IJ SOAJ injection [id number] Yes Inject 0.3 mg  into the muscle as needed for anaphylaxis. 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  Patient taking differently: Inhale 2 puffs into the lungs daily.   Mary Spar, MD Taking Active Self           Med Note [name], [name]   Tue Apr 4, [place]  3:02 PM) Teaching done with patient advising her to administer twice daily not once daily as she is currently doing  hydrochlorothiazide (MICROZIDE) 12.5 MG capsule [id number] Yes TAKE (1) CAPSULE BY MOUTH ONCE EVERY DAY. Mary Spar, MD Taking Active   meloxicam [facility]) 15 MG tablet [id number] No Take 1 tablet (15 mg total) by mouth daily.  Patient not taking: Reported on 11/8/[place]   [name], Mary Heinrich, PA-C Not Taking Active   methocarbamol (ROBAXIN) 500 MG tablet [id number] No Take 1 tablet (500 mg total) by mouth 4 (four) times daily.  Patient not taking: Reported on 11/8/[place]   [name], [name], [place] Not Taking Active   Olopatadine HCl 0.2 % SOLN [id number] Yes Use 1 drop in each eye once [place] day as needed for itchy watery eyes Mary Charon, FNP Taking Active Self  potassium chloride SA (KLOR-CON M) 20 MEQ tablet [id number] Yes Take 1 tablet (20 mEq total) by mouth daily. Mary Dubois, MD Taking Active Self  propranolol (INDERAL) 20 MG tablet [id number] Yes Take 1 tablet (20 mg total) by mouth 2 (two) times daily. Mary Spar, MD Taking Active   Pyridoxine HCl (VITAMIN B6 PO) [id number] No Take by mouth.  Patient not taking: Reported on 11/20/[place]   [provider] Not Taking Active   rOPINIRole (REQUIP) 1 MG tablet [id number] Yes Take 1 tablet (1 mg total) by mouth at bedtime. Mary Spar, MD Taking Active   SUMAtriptan (IMITREX) 100 MG tablet [id number] Yes Take 1 tablet (100 mg  total) by mouth every 2 (two) hours as needed for migraine. May repeat in 2 hours if headache persists or recurs. Mary Spar, MD Taking Active   tamsulosin [facility]) 0.4 MG CAPS capsule [id number] Yes Take 1 capsule (0.4 mg total) by mouth daily. [name], [name], [place] Taking Active    Patient not taking:   Discontinued 11/12/18 1700             Patient Active Problem List   Diagnosis Date Noted   Mass of upper outer quadrant of left breast 07/19/[place]   Intractable migraine without aura and without status migrainosus 05/18/[place]   Urge incontinence 05/18/[place]   Hospital discharge follow-up 03/17/[place]   Hydronephrosis 03/01/[place]   Fibroadenoma of breast, left 03/31/2021   Restless legs 02/10/2021   Vitamin D deficiency 09/30/2020   Menorrhagia with irregular cycle 08/17/2020   Chronic right shoulder pain 07/01/2020   Mild persistent asthma, uncomplicated 63/81/7711   Abnormal uterine bleeding (AUB) 03/12/2017   Intramural leiomyoma of uterus 08/24/2015   Polyclonal gammopathy 07/26/2013   Dysplasia of cervix, unspecified 03/31/2013   Hypertension 09/21/2010   Morbid obesity (  [place]) 11/03/2007    Conditions to be addressed/monitored per PCP order:   community resources  There are no care plans that you recently modified to display for this patient.   Follow up:  Patient agrees to Care Plan and Follow-up.  Plan: The Managed Medicaid care management team will reach out to the patient again over the next 30 days.  Date/time of next scheduled Social Work care management/care coordination outreach:  04/19/22  [name], Arita Miss, [place] Managed Medicaid Team  [phone]

## 2022-03-21 NOTE — Patient Instructions (Signed)
Visit Information  Ms. Defreitas was given information about Medicaid Managed Care team care coordination services as a part of their Healthy [facility] Medicaid benefit. Carrington Clamp [place] verbally consented to engagement with the [facility] Managed Care team.   If you are experiencing a medical emergency, please call 911 or report to your local emergency department or urgent care.   If you have a non-emergency medical problem during routine business hours, please contact your provider's office and ask to speak with a nurse.   For questions related to your Healthy [facility] health plan, please call: [phone] or visit the homepage here: [email or web address]  If you would like to schedule transportation through your Healthy [facility] plan, please call the following number at least 2 days in advance of your appointment: [phone]  For information about your ride after you set it up, call Ride Assist at [phone]. Use this number to activate a Will Call pickup, or if your transportation is late for a scheduled pickup. Use this number, too, if you need to make a change or cancel a previously scheduled reservation.  If you need transportation services right away, call [phone]. The after-hours call center is staffed 24 hours to handle ride assistance and urgent reservation requests (including discharges) 365 days a year. Urgent trips include sick visits, hospital discharge requests and life-sustaining treatment.  Call the [place] at [phone], at any time, 24 hours a day, 7 days a week. If you are in danger or need immediate medical attention call 911.  If you would like help to quit smoking, call 1-800-QUIT-NOW 901 437 4214) OR Espaol: 1-855-Djelo-Ya (0-174-944-9675) o para ms informacin haga clic aqu or Text READY to 200-400 to register via text  Ms. Jutras - following are the goals we discussed in your visit  today:   Goals Addressed   None       Social Worker will follow up on 04/19/22.   [name], BSW, [place] Managed Medicaid Team  [phone]   Following is a copy of your plan of care:  There are no care plans that you recently modified to display for this patient.

## 2022-03-28 ENCOUNTER — Ambulatory Visit: Payer: Medicaid Other | Admitting: Orthopedic Surgery

## 2022-03-28 ENCOUNTER — Encounter: Payer: Self-pay | Admitting: Orthopedic Surgery

## 2022-03-28 DIAGNOSIS — G8929 Other chronic pain: Secondary | ICD-10-CM

## 2022-03-28 DIAGNOSIS — M25511 Pain in right shoulder: Secondary | ICD-10-CM

## 2022-03-28 NOTE — Progress Notes (Signed)
FOLLOW UP   Encounter Diagnosis  Name Primary?   Chronic right shoulder pain Yes     Chief Complaint  Patient presents with   Shoulder Pain    Right     The patient had an injection in her right shoulder.  She had prior surgery on the right shoulder several years ago.  Prior notes indicate the following  March 10, 2019 by Dr. Tamera Punt she had a right shoulder arthroscopy debridement of a labral tear partial subscapularis tear and partial supraspinatus tear along with an acromioplasty. She said she got good relief for approximately 1 year    We gave her an injection and it lasted about 3 weeks  She still has limited flexion limited abduction and weakness in abduction so we will proceed with the MRI to see if she has a tear in the rotator cuff  She is not able to take NSAIDs because of a kidney disorder.

## 2022-03-28 NOTE — Patient Instructions (Addendum)
While we are working on your approval for MRI please go ahead and call to schedule your appointment with [facility] within at least one (1) week.   Central Scheduling [phone]  

## 2022-04-05 ENCOUNTER — Telehealth: Payer: Self-pay | Admitting: Radiology

## 2022-04-05 DIAGNOSIS — M7551 Bursitis of right shoulder: Secondary | ICD-10-CM

## 2022-04-05 DIAGNOSIS — Z9889 Other specified postprocedural states: Secondary | ICD-10-CM

## 2022-04-05 DIAGNOSIS — G8929 Other chronic pain: Secondary | ICD-10-CM

## 2022-04-05 NOTE — Telephone Encounter (Signed)
Denied / needs conservative treatment first, have called patient to advise and ordered therapy for her shoulder

## 2022-04-12 ENCOUNTER — Ambulatory Visit: Payer: Medicaid Other | Admitting: Orthopedic Surgery

## 2022-04-16 ENCOUNTER — Encounter: Payer: Medicaid Other | Admitting: Orthopedic Surgery

## 2022-04-16 ENCOUNTER — Encounter: Payer: Self-pay | Admitting: Orthopedic Surgery

## 2022-04-16 NOTE — Progress Notes (Signed)
Appt in error co pay returned

## 2022-04-18 ENCOUNTER — Ambulatory Visit: Payer: Medicaid Other | Admitting: Internal Medicine

## 2022-04-19 ENCOUNTER — Other Ambulatory Visit: Payer: Medicaid Other

## 2022-04-19 NOTE — Patient Instructions (Signed)
Visit Information  [name] was given information about Medicaid Managed Care team care coordination services as a part of their Healthy [facility] Medicaid benefit. Carrington Clamp [place] verbally consented to engagement with the [facility] Managed Care team.   If you are experiencing a medical emergency, please call 911 or report to your local emergency department or urgent care.   If you have a non-emergency medical problem during routine business hours, please contact your provider's office and ask to speak with a nurse.   For questions related to your Healthy [facility] health plan, please call: [phone] or visit the homepage here: [email or web address]  If you would like to schedule transportation through your Healthy [facility] plan, please call the following number at least 2 days in advance of your appointment: [phone]  For information about your ride after you set it up, call Ride Assist at [phone]. Use this number to activate a Will Call pickup, or if your transportation is late for a scheduled pickup. Use this number, too, if you need to make a change or cancel a previously scheduled reservation.  If you need transportation services right away, call [phone]. The after-hours call center is staffed 24 hours to handle ride assistance and urgent reservation requests (including discharges) 365 days a year. Urgent trips include sick visits, hospital discharge requests and life-sustaining treatment.  Call the [place] at [phone], at any time, 24 hours a day, 7 days a week. If you are in danger or need immediate medical attention call 911.  If you would like help to quit smoking, call 1-800-QUIT-NOW (351) 389-1757) OR Espaol: 1-855-Djelo-Ya (0-086-761-9509) o para ms informacin haga clic aqu or Text READY to 200-400 to register via text  [name] - following are the goals we discussed in your visit  today:   Goals Addressed   None      Social Worker will follow up on 06/01/22.   [name], BSW, [place] Managed Medicaid Team  [phone]   Following is a copy of your plan of care:  There are no care plans that you recently modified to display for this patient.

## 2022-04-19 NOTE — Patient Outreach (Signed)
Medicaid Managed Care Social Work Note  04/19/2022 Name:  [name] MRN:  [id number] DOB:  13-May-1980  [name] is an 41 y.o. year old female who is a primary patient of [name], MD.  The Medicaid Managed Care Coordination team was consulted for assistance with:  Community Resources   Ms. Jayne was given information about Medicaid Managed Care Coordination team services today. [name] Patient agreed to services and verbal consent obtained.  Engaged with patient  for by telephone forfollow up visit in response to referral for case management and/or care coordination services.   Assessments/Interventions:  Review of past medical history, allergies, medications, health status, including review of consultants reports, laboratory and other test data, was performed as part of comprehensive evaluation and provision of chronic care management services.  SDOH: (Social Determinant of Health) assessments and interventions performed: SDOH Interventions    Flowsheet Row Patient Outreach Telephone from 03/19/2022 in [place] Patient Outreach Telephone from 02/12/2022 in [place] Patient Outreach Telephone from 10/06/2021 in [place] Patient Outreach Telephone from 09/01/2021 in [place] Telephone from 08/03/2021 in [place] Patient Outreach Telephone from 08/01/2021 in [place] Coordination  SDOH Interventions        Food Insecurity Interventions -- Intervention Not Indicated -- -- -- Intervention Not Indicated  Housing Interventions Other (Comment)  [Rescheduled with BSW for resources] -- Intervention Not Indicated -- -- Intervention Not Indicated  Transportation Interventions -- Intervention Not Indicated Intervention Not  Indicated -- -- Intervention Not Indicated  [reviewed transportation benefit of managed Medicaid health plan]  Financial Strain Interventions -- --  [Referred to MM BSW, scheduled for 02/15/22 @ 1:30pm] -- -- [id number] Referral Other (Comment)  [referral to MM  BSW and community care guide]  Stress Interventions -- -- -- Patient Refused  [patient states she is currently going through a divorce , offered the services of LCSW. patient states she does not feel she needs the services at this time] -- --  Social Connections Interventions -- -- -- Intervention Not Indicated -- --     BSW completed a telephone outreach with patient. She states she did receive the resources BSW sent to her and no other resources are needed at this time.  Advanced Directives Status:  Not addressed in this encounter.  Care Plan                 Allergies  Allergen Reactions   Banana Rash   Pineapple Rash    Medications Reviewed Today     Reviewed by Marcia Brash, CMA (Certified Medical Assistant) on 04/16/22 at 1501  Med List Status: <None>   Medication Order Taking? Sig Documenting Provider Last Dose Status Informant  albuterol (PROVENTIL HFA) 108 (90 Base) MCG/ACT inhaler [id number] Yes Inhale 2 puffs into the lungs every 6 (six) hours as needed for wheezing (cough). [name], MD Taking Active Self  amLODipine (NORVASC) 5 MG tablet [id number] Yes TAKE 1 TABLET BY MOUTH DAILY. [name], MD Taking Active            Med Note Thamas Jaegers, MELANIE A   Mon Feb 12, 2022 10:31 AM) Planning to pick up this weekend  Blood Pressure Monitoring (BLOOD PRESSURE CUFF) [place] [id number] Yes Large cuff. ICD10: I10. Check blood pressure once daily. Please contact office if blood pressure above 150/90. Posey Pronto,  Colin Broach, MD Taking Active   EPINEPHrine 0.3 mg/0.3 mL IJ SOAJ injection [id number] Yes Inject 0.3 mg into the muscle as needed for anaphylaxis. 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  Patient taking differently: Inhale 2 puffs into the lungs daily.   [name], MD Taking Active Self           Med Note [name], [name]   Tue Aug 01, 2021  3:02 PM) Teaching done with patient advising her to administer twice daily not once daily as she is currently doing  hydrochlorothiazide (MICROZIDE) 12.5 MG capsule [id number] Yes TAKE (1) CAPSULE BY MOUTH ONCE EVERY DAY. [name], MD Taking Active   Olopatadine HCl 0.2 % SOLN [id number]  Use 1 drop in each eye once a day as needed for itchy watery eyes Althea Charon, FNP  Active Self  potassium chloride SA (KLOR-CON M) 20 MEQ tablet [id number] Yes Take 1 tablet (20 mEq total) by mouth daily. Barton Dubois, MD Taking Active Self  propranolol (INDERAL) 20 MG tablet [id number] Yes Take 1 tablet (20 mg total) by mouth 2 (two) times daily. [name], MD Taking Active   rOPINIRole (REQUIP) 1 MG tablet [id number] Yes Take 1 tablet (1 mg total) by mouth at bedtime. [name], MD Taking Active   SUMAtriptan (IMITREX) 100 MG tablet [id number] Yes Take 1 tablet (100 mg total) by mouth every 2 (two) hours as needed for migraine. May repeat in 2 hours if headache persists or recurs. [name], MD Taking Active    Patient not taking:   Discontinued 11/12/18 1700             Patient Active Problem List   Diagnosis Date Noted   Mass of upper outer quadrant of left breast 11/15/2021   Intractable migraine without aura and without status migrainosus 09/14/2021   Urge incontinence 09/14/2021   Hospital discharge follow-up 07/14/2021   Hydronephrosis 06/28/2021   Fibroadenoma of breast, left 03/31/2021   Restless legs 02/10/2021   Vitamin D deficiency 09/30/2020   Menorrhagia with irregular cycle 08/17/2020   Chronic right  shoulder pain 07/01/2020   Mild persistent asthma, uncomplicated 25/63/8937   Abnormal uterine bleeding (AUB) 03/12/2017   Intramural leiomyoma of uterus 08/24/2015   Polyclonal gammopathy 07/26/2013   Dysplasia of cervix, unspecified 03/31/2013   Hypertension 09/21/2010   Morbid obesity ([place]) 11/03/2007    Conditions to be addressed/monitored per PCP order:   community resources  There are no care plans that you recently modified to display for this patient.   Follow up:  Patient agrees to Care Plan and Follow-up.  Plan: The Managed Medicaid care management team will reach out to the patient again over the next 30-60 days.  Date/time of next scheduled Social Work care management/care coordination outreach:  06/01/22  [name], Arita Miss, [place] Medicaid Team  [phone]

## 2022-04-20 ENCOUNTER — Other Ambulatory Visit: Payer: Medicaid Other | Admitting: *Deleted

## 2022-04-20 ENCOUNTER — Encounter: Payer: Self-pay | Admitting: *Deleted

## 2022-04-20 NOTE — Patient Outreach (Signed)
Medicaid Managed Care   Nurse Care Manager Note  04/20/2022 Name:  [name] MRN:  [id number] DOB:  01-12-1981  [name] is an 41 y.o. year old female who is a primary patient of [name], MD.  The [facility] Managed Care Coordination team was consulted for assistance with:    HTN  [name] was given information about Medicaid Managed Care Coordination team services today. [name] Patient agreed to services and verbal consent obtained.  Engaged with patient by telephone for follow up visit in response to provider referral for case management and/or care coordination services.   Assessments/Interventions:  Review of past medical history, allergies, medications, health status, including review of consultants reports, laboratory and other test data, was performed as part of comprehensive evaluation and provision of chronic care management services.  SDOH (Social Determinants of Health) assessments and interventions performed: SDOH Interventions    Flowsheet Row Patient Outreach Telephone from 04/20/2022 in [place] Patient Outreach Telephone from 03/19/2022 in [place] Patient Outreach Telephone from 02/12/2022 in [place] Coordination Patient Outreach Telephone from 10/06/2021 in [place] Patient Outreach Telephone from 09/01/2021 in [facility] [employer] Telephone from 08/03/2021 in [place] Interventions        Food Insecurity Interventions -- -- Intervention Not Indicated -- -- --  Housing Interventions -- Other (Comment)  [Rescheduled with BSW for resources] -- Intervention Not Indicated -- --  Transportation Interventions Intervention Not Indicated -- Intervention Not Indicated Intervention Not Indicated -- --  Utilities  Interventions Intervention Not Indicated -- -- -- -- --  Financial Strain Interventions -- -- --  [Referred to [facility] BSW, scheduled for 02/15/22 @ 1:30pm] -- -- [id number] Referral  Stress Interventions -- -- -- -- Patient Refused  [patient states she is currently going through a divorce , offered the services of LCSW. patient states she does not feel she needs the services at this time] --  Social Connections Interventions -- -- -- -- Intervention Not Indicated --       Care Plan  Allergies  Allergen Reactions   Banana Rash   Pineapple Rash    Medications Reviewed Today     Reviewed by [name], RN (Registered Nurse) on 04/20/22 at [place] List Status: <None>   Medication Order Taking? Sig Documenting Provider Last Dose Status Informant  albuterol (PROVENTIL HFA) 108 (90 Base) MCG/ACT inhaler [id number] Yes Inhale 2 puffs into the lungs every 6 (six) hours as needed for wheezing (cough). [name], MD Taking Active Self  amLODipine (NORVASC) 5 MG tablet [id number] Yes TAKE 1 TABLET BY MOUTH DAILY. [name], MD Taking Active            Med Note Thamas Jaegers, Vlasta Baskin A   Fri Apr 20, 2022  9:07 AM)    Blood Pressure Monitoring (BLOOD PRESSURE CUFF) MISC [id number] Yes Large cuff. ICD10: I10. Check blood pressure once daily. Please contact office if blood pressure above 150/90. [name], MD Taking Active   EPINEPHrine 0.3 mg/0.3 mL IJ SOAJ injection [id number] Yes Inject 0.3 mg into the muscle as needed for anaphylaxis. 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  Patient taking differently: Inhale 2 puffs into the lungs daily.   [name], MD Taking Active Self           Med Note [name], [name]   Tue Aug 01, 2021  3:02 PM) Teaching  done with patient advising her to administer twice daily not once daily as she is currently doing  hydrochlorothiazide (MICROZIDE) 12.5 MG capsule [id number] Yes TAKE (1) CAPSULE BY MOUTH ONCE EVERY DAY. [name], MD Taking Active   Olopatadine HCl 0.2 % SOLN [id number]  Use 1 drop in each eye once a day as needed for itchy watery eyes Althea Charon, FNP  Active Self  potassium chloride SA (KLOR-CON M) 20 MEQ tablet [id number] Yes Take 1 tablet (20 mEq total) by mouth daily. Barton Dubois, MD Taking Active Self  propranolol (INDERAL) 20 MG tablet [id number] Yes Take 1 tablet (20 mg total) by mouth 2 (two) times daily. [name], MD Taking Active   rOPINIRole (REQUIP) 1 MG tablet [id number] Yes Take 1 tablet (1 mg total) by mouth at bedtime. [name], MD Taking Active   SUMAtriptan (IMITREX) 100 MG tablet [id number] Yes Take 1 tablet (100 mg total) by mouth every 2 (two) hours as needed for migraine. May repeat in 2 hours if headache persists or recurs. [name], MD Taking Active    Patient not taking:   Discontinued 11/12/18 1700             Patient Active Problem List   Diagnosis Date Noted   Mass of upper outer quadrant of left breast 11/15/2021   Intractable migraine without aura and without status migrainosus 09/14/2021   Urge incontinence 09/14/2021   Hospital discharge follow-up 07/14/2021   Hydronephrosis 06/28/2021   Fibroadenoma of breast, left 03/31/2021   Restless legs 02/10/2021   Vitamin D deficiency 09/30/2020   Menorrhagia with irregular cycle 08/17/2020   Chronic right shoulder pain 07/01/2020   Mild persistent asthma, uncomplicated 54/27/0623   Abnormal uterine bleeding (AUB) 03/12/2017   Intramural leiomyoma of uterus 08/24/2015   Polyclonal gammopathy 07/26/2013   Dysplasia of cervix, unspecified 03/31/2013   Hypertension 09/21/2010   Morbid obesity ([place]) 11/03/2007    Conditions to be addressed/monitored per PCP order:  HTN  Care Plan  : " RN Care Manager Plan Of Care  Updates made by [name], RN since 04/20/2022 12:00 AM     Problem: Knowledge Deficit and Care Coordination Needs Related to Management of  Asthma, HTN recurrent UTIs and kidney stones   Priority: High     Long-Range Goal: Development of Plan Of Care to Address Care Coordination Needs and Knowledge Deficits for Management of asthma, HTN, recurrent UTIs and kidney stones   Start Date: 08/01/2021  Expected End Date: 08/02/2022  Priority: High  Note:   Current Barriers:  Knowledge Deficits related to plan of care for management of HTN and asthma and recurrent UTIs with kidney stones  Care Coordination needs related to Financial constraints related to utility bills  Financial Constraints.  Insomnia RNCM Clinical Goal(s):  Patient will verbalize understanding of plan for management of HTN, Asthma, and recurrent UTIs with kidney stones as evidenced by decreased incidences/reports of UTIs, no hospital admissions or ED visits related to complications of HTN, asthma or UTI/Kidney stones take all medications exactly as prescribed and will call provider for medication related questions as  evidenced by patient reports of no missed medications    attend all scheduled medical appointments: with medical providers as evidenced by absence of no shows for medical appointments        demonstrate improved adherence to prescribed treatment plan for HTN, Asthma, and recurrent UTIs with kidney stones as evidenced by decreased incidences/reports of UTIs, no hospital admissions or ED related to complications of HTN, Asthma, UTIs or kidney stones continue to work with RN Care Manager and/or Social Worker to address care management and care coordination needs related to HTN, Asthma, and recurrent UTIs and kidney stones as evidenced by adherence to CM Team Scheduled appointments     work with [name] care guide to address needs related to Financial constraints related to  paying utility bills as evidenced by patient and/or community resource care guide support    through collaboration with Consulting civil engineer, provider, and care team.   Interventions: Inter-disciplinary care team collaboration (see longitudinal plan of care) Evaluation of current treatment plan related to  self management and patient's adherence to plan as established by provider    Interdisciplinary Collaboration Interventions:  (Status: Goal on track:  Yes.) Long Term Goal  - Patient unable to afford medication copay. She reports needing assistance with utilities. Collaborated with BSW to initiate plan of care to address needs related to Financial constraints related to payment of household bills  in patient with HTN, Asthma, and recurrent UTIs with kidney stones -telephone outreach scheduled for 02/15/22 @ 1:30pm Inter-disciplinary care team collaboration   Reviewed BSW note, patient with no needs today   Recurrent UTIs and Kidney Stones - New left side pain, consistent with previous kidney stone pain (Status: Goal Met.) Long Term Goal - patient states she continues to have dysuria and left flank pain that she treats with OTC Tylenol, Urology follow up and repeat ultrasound need scheduling  Evaluation of current treatment plan related to  recurrent UTIs and kidney stones ,  self-management and patient's adherence to plan as established by provider. Discussed plans with patient for ongoing care management follow up and provided patient with direct contact information for care management team Reviewed upcoming Urology appointments: schedule ultrasound and follow up with Urology Advised patient to increase fluid intake  Hypertension: (Status: Goal on Track (progressing): YES.) Long Term Goal - Patient checking BP twice weekly, recent reading 130/89 Last practice recorded BP readings:  BP Readings from Last 3 Encounters:  03/07/22 (!) 155/106  02/12/22 (!) 147/88  01/15/22 138/88    Most recent  eGFR/CrCl:  Lab Results  Component Value Date   EGFR 116 08/15/2021    No components found for: "CRCL"  Provided education to patient re: stroke prevention, s/s of heart attack and stroke; Reviewed medications with patient and discussed importance of compliance;  Reviewed scheduled/upcoming provider appointments including: PCP on 05/15/22  Patient Goals/Self-Care Activities: Take medications as prescribed   Attend all scheduled provider appointments Call pharmacy for medication refills 3-7 days in advance of running out of medications Perform all self care activities independently  Perform IADL's (shopping, preparing meals, housekeeping, managing finances) independently Call provider office for new concerns or questions  Work with the social worker to address care coordination needs and will continue to work with the clinical team to address health care and disease management related needs check blood pressure weekly write blood pressure results in a log or diary learn about high blood pressure call doctor for signs and symptoms of high blood pressure  Follow Up:  Patient agrees to Care Plan and Follow-up.  Plan: The Managed Medicaid care management team will reach out to the patient again over the next 90 days.  Date/time of next scheduled RN care management/care coordination outreach:  07/19/22 @ 1:15pm  [name] RN, BSN [place]  [employer] manager

## 2022-04-20 NOTE — Patient Instructions (Signed)
Visit Information  [name] was given information about Medicaid Managed Care team care coordination services as a part of their Healthy [facility] Medicaid benefit. [name] verbally consented to engagement with the [facility] Managed Care team.   If you are experiencing a medical emergency, please call 911 or report to your local emergency department or urgent care.   If you have a non-emergency medical problem during routine business hours, please contact your provider's office and ask to speak with a nurse.   For questions related to your Healthy [facility] health plan, please call: [phone] or visit the homepage here: [email or web address]  If you would like to schedule transportation through your Healthy [facility] plan, please call the following number at least 2 days in advance of your appointment: [phone]  For information about your ride after you set it up, call Ride Assist at [phone]. Use this number to activate a Will Call pickup, or if your transportation is late for a scheduled pickup. Use this number, too, if you need to make a change or cancel a previously scheduled reservation.  If you need transportation services right away, call [phone]. The after-hours call center is staffed 24 hours to handle ride assistance and urgent reservation requests (including discharges) 365 days a year. Urgent trips include sick visits, hospital discharge requests and life-sustaining treatment.  Call the [place] at [phone], at any time, 24 hours a day, 7 days a week. If you are in danger or need immediate medical attention call 911.  If you would like help to quit smoking, call 1-800-QUIT-NOW 915-531-2428) OR Espaol: 1-855-Djelo-Ya (6-010-932-3557) o para ms informacin haga clic aqu or Text READY to 200-400 to register via text  [name],   Please see education materials related to HTN  provided by MyChart link.  Patient verbalizes understanding of instructions and care plan provided today and agrees to view in [place]. Active MyChart status and patient understanding of how to access instructions and care plan via MyChart confirmed with patient.     Telephone follow up appointment with Managed Medicaid care management team member scheduled for:07/19/22 @ 1:15pm  [name] RN, BSN [place] RN Care Coordinator   Following is a copy of your plan of care:  Care Plan : " [place]  Updates made by Mary Montane, RN since 04/20/2022 12:00 AM     Problem: Knowledge Deficit and Care Coordination Needs Related to Management of  Asthma, HTN recurrent UTIs and kidney stones   Priority: High     Long-Range Goal: Development of Plan Of Care to Address Care Coordination Needs and Knowledge Deficits for Management of asthma, HTN, recurrent UTIs and kidney stones   Start Date: 08/01/2021  Expected End Date: 08/02/2022  Priority: High  Note:   Current Barriers:  Knowledge Deficits related to plan of care for management of HTN and asthma and recurrent UTIs with kidney stones  Care Coordination needs related to Financial constraints related to utility bills  Film/video editor.  Insomnia RNCM Clinical Goal(s):  Patient will verbalize understanding of plan for management of HTN, Asthma, and recurrent UTIs with kidney stones as evidenced by decreased incidences/reports of UTIs, no hospital admissions or ED visits related to complications of HTN, asthma or UTI/Kidney stones take all medications exactly as prescribed and will call provider for medication related questions as evidenced by patient reports of no missed medications    attend all scheduled medical appointments:  with medical providers as evidenced by absence of no shows for medical appointments        demonstrate improved adherence to prescribed treatment plan for HTN,  Asthma, and recurrent UTIs with kidney stones as evidenced by decreased incidences/reports of UTIs, no hospital admissions or ED related to complications of HTN, Asthma, UTIs or kidney stones continue to work with RN Care Manager and/or Social Worker to address care management and care coordination needs related to HTN, Asthma, and recurrent UTIs and kidney stones as evidenced by adherence to CM Team Scheduled appointments     work with [name] care guide to address needs related to Financial constraints related to paying utility bills as evidenced by patient and/or community resource care guide support    through collaboration with Consulting civil engineer, provider, and care team.   Interventions: Inter-disciplinary care team collaboration (see longitudinal plan of care) Evaluation of current treatment plan related to  self management and patient's adherence to plan as established by provider    Interdisciplinary Collaboration Interventions:  (Status: Goal on track:  Yes.) Long Term Goal  - Patient unable to afford medication copay. She reports needing assistance with utilities. Collaborated with BSW to initiate plan of care to address needs related to Financial constraints related to payment of household bills  in patient with HTN, Asthma, and recurrent UTIs with kidney stones -telephone outreach scheduled for 02/15/22 @ 1:30pm Inter-disciplinary care team collaboration   Reviewed BSW note, patient with no needs today   Recurrent UTIs and Kidney Stones - New left side pain, consistent with previous kidney stone pain (Status: Goal Met.) Long Term Goal - patient states she continues to have dysuria and left flank pain that she treats with OTC Tylenol, Urology follow up and repeat ultrasound need scheduling  Evaluation of current treatment plan related to  recurrent UTIs and kidney stones ,  self-management and patient's adherence to plan as established by provider. Discussed plans with patient  for ongoing care management follow up and provided patient with direct contact information for care management team Reviewed upcoming Urology appointments: schedule ultrasound and follow up with Urology Advised patient to increase fluid intake  Hypertension: (Status: Goal on Track (progressing): YES.) Long Term Goal - Patient checking BP twice weekly, recent reading 130/89 Last practice recorded BP readings:  BP Readings from Last 3 Encounters:  03/07/22 (!) 155/106  02/12/22 (!) 147/88  01/15/22 138/88    Most recent eGFR/CrCl:  Lab Results  Component Value Date   EGFR 116 08/15/2021    No components found for: "CRCL"  Provided education to patient re: stroke prevention, s/s of heart attack and stroke; Reviewed medications with patient and discussed importance of compliance;  Reviewed scheduled/upcoming provider appointments including: PCP on 05/15/22  Patient Goals/Self-Care Activities: Take medications as prescribed   Attend all scheduled provider appointments Call pharmacy for medication refills 3-7 days in advance of running out of medications Perform all self care activities independently  Perform IADL's (shopping, preparing meals, housekeeping, managing finances) independently Call provider office for new concerns or questions  Work with the social worker to address care coordination needs and will continue to work with the clinical team to address health care and disease management related needs check blood pressure weekly write blood pressure results in a log or diary learn about high blood pressure call doctor for signs and symptoms of high blood pressure

## 2022-05-01 ENCOUNTER — Ambulatory Visit (HOSPITAL_COMMUNITY): Payer: Medicaid Other | Admitting: Occupational Therapy

## 2022-05-07 ENCOUNTER — Encounter (HOSPITAL_COMMUNITY): Payer: Self-pay | Admitting: Occupational Therapy

## 2022-05-07 ENCOUNTER — Ambulatory Visit (HOSPITAL_COMMUNITY): Payer: Medicaid Other | Attending: Internal Medicine | Admitting: Occupational Therapy

## 2022-05-07 DIAGNOSIS — G8929 Other chronic pain: Secondary | ICD-10-CM | POA: Insufficient documentation

## 2022-05-07 DIAGNOSIS — M25511 Pain in right shoulder: Secondary | ICD-10-CM | POA: Insufficient documentation

## 2022-05-07 DIAGNOSIS — M25611 Stiffness of right shoulder, not elsewhere classified: Secondary | ICD-10-CM | POA: Insufficient documentation

## 2022-05-07 DIAGNOSIS — R29898 Other symptoms and signs involving the musculoskeletal system: Secondary | ICD-10-CM

## 2022-05-07 NOTE — Therapy (Signed)
OUTPATIENT OCCUPATIONAL THERAPY ORTHO EVALUATION  Patient Name: [name] MRN: [id number] DOB:March 07, 1981, 42 y.o., female Today's Date: 05/07/2022  PCP: Dr. Ihor Dow REFERRING PROVIDER: Dr. Arther Abbott  END OF SESSION:  OT End of Session - 05/07/22 1339     Visit Number 1    Number of Visits 4    Date for OT Re-Evaluation 06/14/22    Authorization Type [place] Medicaid    Authorization Time Period Requesting 4 visits    Authorization - Visit Number 0    Authorization - Number of Visits 4    OT Start Time 1301    OT Stop Time 1330    OT Time Calculation (min) 29 min    Activity Tolerance Patient tolerated treatment well    Behavior During Therapy [facility] for tasks assessed/performed             Past Medical History:  Diagnosis Date   AKI (acute kidney injury) ([place]) 08/17/2021   Allergy    banana/pineapple   Anemia    post pregnancy   Anxiety    Asthma    Autoimmune disease, not elsewhere classified(279.49) 2013   Non specific- [facility], abnormal Bone Marrow signal   Breast lump in female 11/28/2012   Has tender round mass at 10-11 0'clock right breast 4 finger breaths from [place] will US   Breast mass, right 08/18/2012   Chlamydia    Chronic breast pain 05/31/2015   Dysmenorrhea 03/23/2014   Dyspareunia 01/05/2014   Elevated prolactin level 02/10/2021   Was 54.5, recheck in December ________   Fibroids, intramural 08/24/2015   GERD (gastroesophageal reflux disease)    Gonorrhea    Headache(784.0)    History of abnormal cervical Pap smear 04/02/2014   Hypertension    Kidney stone    06/2021   LLQ abdominal tenderness 05/31/2015   Menorrhagia 01/05/2014   Neuromuscular disorder (HCC)    lower back and bilat leg pain   Obesity    Other and unspecified ovarian cyst 01/12/2014   Polyclonal gammopathy 07/26/2013   Insignificant   Pyelonephritis 06/28/2021   Recurrent upper respiratory infection (URI)    Scoliosis    Urticaria    Vaginal  irritation 09/30/2014   Vaginal Pap smear, abnormal    Past Surgical History:  Procedure Laterality Date   ADENOIDECTOMY     BREAST BIOPSY Left 02/2021   CYSTOSCOPY W/ URETERAL STENT PLACEMENT Left 06/29/2021   Procedure: CYSTOSCOPY WITH RETROGRADE PYELOGRAM/URETERAL STENT PLACEMENT;  Surgeon: Cleon Gustin, MD;  Location: [facility];  Service: Urology;  Laterality: Left;   CYSTOSCOPY WITH RETROGRADE PYELOGRAM, URETEROSCOPY AND STENT PLACEMENT Left 07/27/2021   Procedure: CYSTOSCOPY WITH RETROGRADE PYELOGRAM, URETEROSCOPY, STONE EXTRACTION WITH BASKET;  Surgeon: Cleon Gustin, MD;  Location: [facility];  Service: Urology;  Laterality: Left;   DILATATION AND CURETTAGE/HYSTEROSCOPY WITH MINERVA N/A 03/14/2021   Procedure: HYSTEROSCOPY WITH MINERVA;  Surgeon: Florian Buff, MD;  Location: [facility];  Service: Gynecology;  Laterality: N/A;   DILATION AND CURETTAGE OF UTERUS     LAPAROSCOPIC BILATERAL SALPINGECTOMY Bilateral 03/16/2020   Procedure: LAPAROSCOPIC BILATERAL SALPINGECTOMY;  Surgeon: Florian Buff, MD;  Location: [facility];  Service: Gynecology;  Laterality: Bilateral;   SHOULDER SURGERY Right 03/10/2019   TONSILLECTOMY     TUBAL LIGATION N/A    [employer] 06/28/2020   Patient Active Problem List   Diagnosis Date Noted   Mass of upper outer quadrant of left breast 11/15/2021   Intractable migraine without aura and  without status migrainosus 09/14/2021   Urge incontinence 09/14/2021   Hospital discharge follow-up 07/14/2021   Hydronephrosis 06/28/2021   Fibroadenoma of breast, left 03/31/2021   Restless legs 02/10/2021   Vitamin D deficiency 09/30/2020   Menorrhagia with irregular cycle 08/17/2020   Chronic right shoulder pain 07/01/2020   Mild persistent asthma, uncomplicated 49/82/6415   Abnormal uterine bleeding (AUB) 03/12/2017   Intramural leiomyoma of uterus 08/24/2015   Polyclonal gammopathy 07/26/2013   Dysplasia of cervix, unspecified 03/31/2013   Hypertension  09/21/2010   Morbid obesity ([place]) 11/03/2007    ONSET DATE: mid-2023  REFERRING DIAG: Chronic Right Shoulder Pain  THERAPY DIAG:  Chronic right shoulder pain - Plan: Ot plan of care cert/re-cert  Other symptoms and signs involving the musculoskeletal system - Plan: Ot plan of care cert/re-cert  Stiffness of right shoulder, not elsewhere classified - Plan: Ot plan of care cert/re-cert  Rationale for Evaluation and Treatment: Rehabilitation  SUBJECTIVE:   SUBJECTIVE STATEMENT: S: It's been bothering me for about a year now.  Pt accompanied by: self  PERTINENT HISTORY: Pt is a 42 y/o female presenting with chronic right shoulder pain. Pt has a hx of right shoulder arthroscopic debridement and acromioplasty in 2020, had relief for approximately 1-2 years. Pt received an injection on 11/8 which helped for approximately 3 weeks. An MRI was recommended, pt has not scheduled yet. Pt has has increased pain over the past year.   PRECAUTIONS: None  WEIGHT BEARING RESTRICTIONS: No  PAIN:  Are you having pain? Yes: NPRS scale: 4/10 Pain location: right shoulder Pain description: aching, sore, sharp at times Aggravating factors: Constantly using it, cold Relieving factors: heating pad, icy hot  FALLS: Has patient fallen in last 6 months? No   PLOF: Independent  PATIENT GOALS: To have less pain the right arm  NEXT MD VISIT: 05/21/2022  OBJECTIVE:   HAND DOMINANCE: Right  ADLs: Overall ADLs: pt reports difficulty with reaching overhead and behind her back, lifting pots and pans, doing house work, lifting her 60 year old daughter. Pt reports sleeping is difficult, pt can sleep on her right side for about 30 minutes. 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    FUNCTIONAL OUTCOME MEASURES: Quick Dash: 70.45  UPPER EXTREMITY ROM:       Assessed seated, er/IR adducted  Active ROM Right eval  Shoulder flexion 111  Shoulder abduction 74  Shoulder  internal rotation 90  Shoulder external rotation 60  (Blank rows = not tested)   UPPER EXTREMITY MMT:       Assessed seated, er/IR adducted  MMT Right eval  Shoulder flexion 4/5  Shoulder abduction 4/5  Shoulder internal rotation 4+/5  Shoulder external rotation 4/5  (Blank rows = not tested)  HAND FUNCTION: Grip strength: Right: 45 lbs; Left: 100 lbs  SENSATION: WFL  EDEMA: None on eval, had some mild swelling over Christmas when using her arm more  COGNITION: Overall cognitive status: Within functional limits for tasks assessed  OBSERVATIONS: mod fascial restrictions in anterior shoulder and upper trapezius regions   TODAY'S TREATMENT:  DATE: N/A-eval only     PATIENT EDUCATION: Education details: table slides Person educated: Patient Education method: Explanation, Demonstration, and Handouts Education comprehension: verbalized understanding and returned demonstration  HOME EXERCISE PROGRAM: Eval: table slides  GOALS: Goals reviewed with patient? Yes  SHORT TERM GOALS: Target date: 06/14/22  Pt will be provided with and educated on HEP to improve mobility in RUE required for use during ADL completion.   Goal status: INITIAL  2.  Pt will increase RUE P/ROM by at least 45 degrees to improve ability to use RUE during dressing and bathing tasks with minimal compensatory techniques.   Goal status: INITIAL  3.  Pt will increase RUE strength to 4+/5 to improve ability to perform housework and caregiver tasks for her daughter.   Goal status: INITIAL  4.  Pt will decrease pain in RUE to 3/10 or less to improve ability to sleep for 2+ consecutive hours not waking due to pain.   Goal status: INITIAL  5.  Pt will decrease RUE fascial restrictions to min amounts or less to improve ability to perform functional reaching tasks.   Goal status:  INITIAL     ASSESSMENT:  CLINICAL IMPRESSION: Patient is a 42 y.o. female who was seen today for occupational therapy evaluation for right shoulder pain. Pt has hx of arthroscopic shoulder surgery in 2020 and began having pain return in mid-2023. Pt reports increasing pain throughout the year, increases with consistent use. Pt with decreased ROM and strength, as well as increased pain and fascial restrictions limiting use during ADLs.   PERFORMANCE DEFICITS: in functional skills including ADLs, IADLs, ROM, strength, pain, fascial restrictions, and UE functional use  IMPAIRMENTS: are limiting patient from ADLs, IADLs, rest and sleep, education, and leisure.   COMORBIDITIES: has no other co-morbidities that affects occupational performance. Patient will benefit from skilled OT to address above impairments and improve overall function.  MODIFICATION OR ASSISTANCE TO COMPLETE EVALUATION: No modification of tasks or assist necessary to complete an evaluation.  OT OCCUPATIONAL PROFILE AND HISTORY: Problem focused assessment: Including review of records relating to presenting problem.  CLINICAL DECISION MAKING: LOW - limited treatment options, no task modification necessary  REHAB POTENTIAL: Good  EVALUATION COMPLEXITY: Low      PLAN:  OT FREQUENCY: 1x/week  OT DURATION: 4 weeks  PLANNED INTERVENTIONS: self care/ADL training, therapeutic exercise, therapeutic activity, manual therapy, electrical stimulation, ultrasound, moist heat, patient/family education, and DME and/or AE instructions  RECOMMENDED OTHER SERVICES: N/A  CONSULTED AND AGREED WITH PLAN OF CARE: Patient  PLAN FOR NEXT SESSION: Follow up on HEP, begin manual techniques, AA/ROM, pulleys, therapy ball rolls   Guadelupe Sabin, OTR/L  [phone] 05/07/2022, 1:39 PM

## 2022-05-07 NOTE — Patient Instructions (Signed)
1) SHOULDER: Flexion On Table   Place hands on towel placed on table, elbows straight. Lean forward with you upper body, pushing towel away from body.  _10-15__ reps per set, _3-5__ sets per day  2) Abduction (Passive)   With arm out to side, resting on towel placed on table with palm DOWN, keeping trunk away from table, lean to the side while pushing towel away from body.  Repeat __10-15__ times. Do __3-5__ sessions per day.  Copyright  VHI. All rights reserved.     3) Internal Rotation (Assistive)   Seated with elbow bent at right angle and held against side, slide arm on table surface in an inward arc keeping elbow anchored in place. Repeat _10-15___ times. Do _3-5___ sessions per day. Activity: Use this motion to brush crumbs off the table.  Copyright  VHI. All rights reserved.

## 2022-05-09 ENCOUNTER — Other Ambulatory Visit (HOSPITAL_COMMUNITY): Payer: Self-pay | Admitting: Adult Health

## 2022-05-09 DIAGNOSIS — N6320 Unspecified lump in the left breast, unspecified quadrant: Secondary | ICD-10-CM

## 2022-05-15 ENCOUNTER — Encounter: Payer: Self-pay | Admitting: Internal Medicine

## 2022-05-15 ENCOUNTER — Ambulatory Visit: Payer: Medicaid Other | Admitting: Internal Medicine

## 2022-05-15 VITALS — BP 138/94 | HR 87 | Ht 65.0 in | Wt 300.6 lb

## 2022-05-15 DIAGNOSIS — G43019 Migraine without aura, intractable, without status migrainosus: Secondary | ICD-10-CM | POA: Diagnosis not present

## 2022-05-15 DIAGNOSIS — I10 Essential (primary) hypertension: Secondary | ICD-10-CM | POA: Diagnosis not present

## 2022-05-15 DIAGNOSIS — G2581 Restless legs syndrome: Secondary | ICD-10-CM

## 2022-05-15 DIAGNOSIS — R739 Hyperglycemia, unspecified: Secondary | ICD-10-CM | POA: Diagnosis not present

## 2022-05-15 DIAGNOSIS — E782 Mixed hyperlipidemia: Secondary | ICD-10-CM | POA: Diagnosis not present

## 2022-05-15 DIAGNOSIS — J4530 Mild persistent asthma, uncomplicated: Secondary | ICD-10-CM

## 2022-05-15 DIAGNOSIS — E559 Vitamin D deficiency, unspecified: Secondary | ICD-10-CM

## 2022-05-15 MED ORDER — PROPRANOLOL HCL 20 MG PO TABS: 20.0000 mg | ORAL_TABLET | Freq: Two times a day (BID) | ORAL | 2 refills | Status: DC

## 2022-05-15 MED ORDER — ROPINIROLE HCL 1 MG PO TABS: 1.0000 mg | ORAL_TABLET | Freq: Every day | ORAL | 3 refills | Status: DC

## 2022-05-15 NOTE — Assessment & Plan Note (Addendum)
Her headaches are likely migraine Has sumatriptan to 100 mg, as needed for migraine Has about 2 episodes per week, added propranolol for migraine ppx Tylenol PRN for mild to moderate pain

## 2022-05-15 NOTE — Patient Instructions (Addendum)
Please start taking Propranolol as prescribed.  Please continue taking other medications as prescribed.  Please continue to follow DASH diet and perform moderate exercise/walking at least 150 mins/week.  Please get fasting blood tests done before the next visit.

## 2022-05-16 DIAGNOSIS — E782 Mixed hyperlipidemia: Secondary | ICD-10-CM | POA: Insufficient documentation

## 2022-05-16 NOTE — Assessment & Plan Note (Signed)
Overall well controlled with ropinirole

## 2022-05-16 NOTE — Assessment & Plan Note (Signed)
BP Readings from Last 1 Encounters:  05/15/22 (!) 138/94   Uncontrolled with Amlodipine and HCTZ Added propranolol - for migraine, should help with BP control as well Counseled for compliance with the medications Advised DASH diet and moderate exercise/walking, at least 150 mins/week

## 2022-05-16 NOTE — Assessment & Plan Note (Signed)
Advised to follow low cholesterol diet for now

## 2022-05-16 NOTE — Assessment & Plan Note (Signed)
Better controlled with Advair and PRN Albuterol now

## 2022-05-16 NOTE — Assessment & Plan Note (Signed)
Has been less active since delivery Plans to resume exercise DASH diet and moderate exercise/walking at least 150 mins/week She is interested in GLP-1 agonist therapy, but insurance coverage is concern

## 2022-05-16 NOTE — Progress Notes (Signed)
Established Patient Office Visit  Subjective:  Patient ID: [name], female    DOB: 15-Sep-1980  Age: 42 y.o. MRN: [id number]  CC:  Chief Complaint  Patient presents with   Hypertension    Patient is here for a three month follow up for hypertension     HPI [name] is a 42 y.o. female with past medical history of hypertension, asthma, gestational diabetes and morbid obesity who presents for f/u of her chronic medical conditions.  HTN: Her BP was more borderline elevated today.  She reports that her BP has been running high at home, around 140s/90s.  She has an automatic wrist cuff at home.  She has headache at times, but denies any chest pain, dyspnea or palpitations currently. Of note, she was placed on propranolol for migraine, which she has not started taking it as she has not received it from the pharmacy yet.  Migraine: She complains of about 2 episodes of migraine every week.  She has tried taking sumatriptan, which helps at 100 mg dose. She complains of intermittent headache, unilateral at times, lasting for few hours to a day and is associated with dizziness and nausea.  She denies any photophobia or phonophobia currently.  Obesity: She states that she has been trying to follow healthy diet to lose weight.  She has not been able to lose weight.   Restless leg syndrome: She has noticed improvement in her leg pain/cramps with ropinirole.     Past Medical History:  Diagnosis Date   AKI (acute kidney injury) ([place]) 08/17/2021   Allergy    banana/pineapple   Anemia    post pregnancy   Anxiety    Asthma    Autoimmune disease, not elsewhere classified(279.49) 2013   Non specific- [facility], abnormal Bone Marrow signal   Breast lump in female 11/28/2012   Has tender round mass at 10-11 0'clock right breast 4 finger breaths from [place] will US   Breast mass, right 08/18/2012   Chlamydia    Chronic breast pain 05/31/2015   Dysmenorrhea  03/23/2014   Dyspareunia 01/05/2014   Elevated prolactin level 02/10/2021   Was 54.5, recheck in December ________   Fibroids, intramural 08/24/2015   GERD (gastroesophageal reflux disease)    Gonorrhea    Headache(784.0)    History of abnormal cervical Pap smear 04/02/2014   Hypertension    Kidney stone    06/2021   LLQ abdominal tenderness 05/31/2015   Menorrhagia 01/05/2014   Neuromuscular disorder (HCC)    lower back and bilat leg pain   Obesity    Other and unspecified ovarian cyst 01/12/2014   Polyclonal gammopathy 07/26/2013   Insignificant   Pyelonephritis 06/28/2021   Recurrent upper respiratory infection (URI)    Scoliosis    Urticaria    Vaginal irritation 09/30/2014   Vaginal Pap smear, abnormal     Past Surgical History:  Procedure Laterality Date   ADENOIDECTOMY     BREAST BIOPSY Left 02/2021   CYSTOSCOPY W/ URETERAL STENT PLACEMENT Left 06/29/2021   Procedure: CYSTOSCOPY WITH RETROGRADE PYELOGRAM/URETERAL STENT PLACEMENT;  Surgeon: Cleon Gustin, MD;  Location: [facility];  Service: Urology;  Laterality: Left;   CYSTOSCOPY WITH RETROGRADE PYELOGRAM, URETEROSCOPY AND STENT PLACEMENT Left 07/27/2021   Procedure: CYSTOSCOPY WITH RETROGRADE PYELOGRAM, URETEROSCOPY, STONE EXTRACTION WITH BASKET;  Surgeon: Cleon Gustin, MD;  Location: [facility];  Service: Urology;  Laterality: Left;   DILATATION AND CURETTAGE/HYSTEROSCOPY WITH MINERVA N/A 03/14/2021   Procedure: HYSTEROSCOPY  WITH MINERVA;  Surgeon: Florian Buff, MD;  Location: [facility];  Service: Gynecology;  Laterality: N/A;   DILATION AND CURETTAGE OF UTERUS     LAPAROSCOPIC BILATERAL SALPINGECTOMY Bilateral 03/16/2020   Procedure: LAPAROSCOPIC BILATERAL SALPINGECTOMY;  Surgeon: Florian Buff, MD;  Location: [facility];  Service: Gynecology;  Laterality: Bilateral;   SHOULDER SURGERY Right 03/10/2019   TONSILLECTOMY     TUBAL LIGATION N/A    Phreesia 06/28/2020    Family History  Problem Relation Age of Onset    Hypertension Mother    Diabetes Mother    Heart disease Mother    Diabetes Father    Cancer Paternal Aunt        breast   Hypertension Maternal Grandmother    Diabetes Son        pre diabetic   Other Son        overactive bladder   COPD Maternal Aunt    Leukemia Maternal Aunt    Allergic rhinitis Neg Hx    Angioedema Neg Hx    Asthma Neg Hx    Atopy Neg Hx    Eczema Neg Hx    Immunodeficiency Neg Hx    Urticaria Neg Hx     Social History   Socioeconomic History   Marital status: Legally Separated    Spouse name: Jameel   Number of children: 2   Years of education: Not on file   Highest education level: Not on file  Occupational History   Not on file  Tobacco Use   Smoking status: Never   Smokeless tobacco: Never  Vaping Use   Vaping Use: Never used  Substance and Sexual Activity   Alcohol use: No   Drug use: No   Sexual activity: Yes    Birth control/protection: Surgical    Comment: tubal & ablation  Other Topics Concern   Not on file  Social History Narrative   Not on file   Social Determinants of Health   Financial Resource Strain: Medium Risk (02/12/2022)   Overall Financial Resource Strain (CARDIA)    Difficulty of Paying Living Expenses: Somewhat hard  Food Insecurity: No Food Insecurity (02/12/2022)   Hunger Vital Sign    Worried About Running Out of Food in the Last Year: Never true    Ran Out of Food in the Last Year: Never true  Transportation Needs: No Transportation Needs (04/20/2022)   PRAPARE - Hydrologist (Medical): No    Lack of Transportation (Non-Medical): No  Physical Activity: Insufficiently Active (02/23/2020)   Exercise Vital Sign    Days of Exercise per Week: 2 days    Minutes of Exercise per Session: 10 min  Stress: Stress Concern Present (09/02/2021)   [place]    Feeling of Stress : Rather much  Social Connections: Unknown  (09/01/2021)   Social Connection and Isolation Panel [NHANES]    Frequency of Communication with Friends and Family: More than three times a week    Frequency of Social Gatherings with Friends and Family: Three times a week    Attends Religious Services: 1 to 4 times per year    Active Member of Clubs or Organizations: No    Attends Archivist Meetings: Never    Marital Status: Not on file  Intimate Partner Violence: Not At Risk (09/01/2021)   Humiliation, Afraid, Rape, and Kick questionnaire    Fear of Current or Ex-Partner: No  Emotionally Abused: No    Physically Abused: No    Sexually Abused: No    Outpatient Medications Prior to Visit  Medication Sig Dispense Refill   albuterol (PROVENTIL HFA) 108 (90 Base) MCG/ACT inhaler Inhale 2 puffs into the lungs every 6 (six) hours as needed for wheezing (cough). 8 g 5   amLODipine (NORVASC) 5 MG tablet TAKE 1 TABLET BY MOUTH DAILY. 90 tablet 0   Blood Pressure Monitoring (BLOOD PRESSURE CUFF) MISC Large cuff. ICD10: I10. Check blood pressure once daily. Please contact office if blood pressure above 150/90. 1 each 0   EPINEPHrine 0.3 mg/0.3 mL IJ SOAJ injection Inject 0.3 mg into the muscle as needed for anaphylaxis. 1 each 1   fluticasone-salmeterol (ADVAIR HFA) 115-21 MCG/ACT inhaler Inhale 2 puffs into the lungs 2 (two) times daily. (Patient taking differently: Inhale 2 puffs into the lungs daily.) 1 each 12   hydrochlorothiazide (MICROZIDE) 12.5 MG capsule TAKE (1) CAPSULE BY MOUTH ONCE EVERY DAY. 90 capsule 0   potassium chloride SA (KLOR-CON M) 20 MEQ tablet Take 1 tablet (20 mEq total) by mouth daily. 30 tablet 0   SUMAtriptan (IMITREX) 100 MG tablet Take 1 tablet (100 mg total) by mouth every 2 (two) hours as needed for migraine. May repeat in 2 hours if headache persists or recurs. 10 tablet 2   Olopatadine HCl 0.2 % SOLN Use 1 drop in each eye once a day as needed for itchy watery eyes 2.5 mL 3   propranolol (INDERAL) 20 MG  tablet Take 1 tablet (20 mg total) by mouth 2 (two) times daily. 60 tablet 2   rOPINIRole (REQUIP) 1 MG tablet Take 1 tablet (1 mg total) by mouth at bedtime. 30 tablet 1   No facility-administered medications prior to visit.    Allergies  Allergen Reactions   Banana Rash   Pineapple Rash    ROS Review of Systems  Constitutional:  Negative for chills and fever.  HENT:  Negative for congestion, sinus pressure, sinus pain and sore throat.   Eyes:  Negative for pain and discharge.  Respiratory:  Negative for cough and wheezing.   Cardiovascular:  Positive for leg swelling. Negative for chest pain and palpitations.  Gastrointestinal:  Negative for abdominal pain, diarrhea, nausea and vomiting.  Endocrine: Negative for polydipsia and polyuria.  Genitourinary:  Negative for dysuria and hematuria.  Musculoskeletal:  Negative for neck pain and neck stiffness.       Leg pain  Skin:  Negative for rash.  Neurological:  Positive for headaches. Negative for dizziness and weakness.  Psychiatric/Behavioral:  Negative for agitation and behavioral problems.       Objective:    Physical Exam Vitals reviewed.  Constitutional:      General: She is not in acute distress.    Appearance: She is obese. She is not diaphoretic.  HENT:     Head: Normocephalic and atraumatic.     Nose: Nose normal. No congestion.     Mouth/Throat:     Mouth: Mucous membranes are moist.     Pharynx: No posterior oropharyngeal erythema.  Eyes:     General: No scleral icterus.    Extraocular Movements: Extraocular movements intact.  Cardiovascular:     Rate and Rhythm: Normal rate and regular rhythm.     Pulses: Normal pulses.     Heart sounds: Normal heart sounds. No murmur heard. Pulmonary:     Breath sounds: Normal breath sounds. No wheezing or rales.  Musculoskeletal:  Cervical back: Neck supple. No tenderness.     Right lower leg: No edema.     Left lower leg: No edema.  Skin:    General: Skin is  warm.     Findings: No rash.  Neurological:     General: No focal deficit present.     Mental Status: She is alert and oriented to person, place, and time.     Sensory: No sensory deficit.     Motor: No weakness.  Psychiatric:        Mood and Affect: Mood normal.        Behavior: Behavior normal.     BP (!) 138/94 (BP Location: Right Arm, Cuff Size: Normal)   Pulse 87   Ht '5\' 5"'$  (1.651 m)   Wt (!) 300 lb 9.6 oz (136.4 kg)   SpO2 99%   BMI 50.02 kg/m  Wt Readings from Last 3 Encounters:  05/15/22 (!) 300 lb 9.6 oz (136.4 kg)  03/07/22 270 lb (122.5 kg)  02/12/22 270 lb (122.5 kg)    Lab Results  Component Value Date   TSH 2.080 02/08/2021   Lab Results  Component Value Date   WBC 9.0 07/10/2021   HGB 12.4 07/10/2021   HCT 38.4 07/10/2021   MCV 91.4 07/10/2021   PLT 373 07/10/2021   Lab Results  Component Value Date   NA 137 08/15/2021   K 3.3 (L) 08/15/2021   CO2 23 08/15/2021   GLUCOSE 103 (H) 08/15/2021   BUN 10 08/15/2021   CREATININE 0.64 08/15/2021   BILITOT 0.4 07/10/2021   ALKPHOS 80 07/10/2021   AST 9 (L) 07/10/2021   ALT 8 07/10/2021   PROT 7.1 07/10/2021   ALBUMIN 3.2 (L) 07/10/2021   CALCIUM 8.7 08/15/2021   ANIONGAP 7 07/10/2021   EGFR 116 08/15/2021   Lab Results  Component Value Date   CHOL 116 09/28/2020   Lab Results  Component Value Date   HDL 36 (L) 09/28/2020   Lab Results  Component Value Date   LDLCALC 62 09/28/2020   Lab Results  Component Value Date   TRIG 92 09/28/2020   Lab Results  Component Value Date   CHOLHDL 3.2 09/28/2020   Lab Results  Component Value Date   HGBA1C 5.4 09/28/2020      Assessment & Plan:   Problem List Items Addressed This Visit       Cardiovascular and Mediastinum   Hypertension - Primary    BP Readings from Last 1 Encounters:  05/15/22 (!) 138/94  Uncontrolled with Amlodipine and HCTZ Added propranolol - for migraine, should help with BP control as well Counseled for  compliance with the medications Advised DASH diet and moderate exercise/walking, at least 150 mins/week      Relevant Medications   propranolol (INDERAL) 20 MG tablet   Other Relevant Orders   TSH   CMP14+EGFR   CBC with Differential/Platelet   Intractable migraine without aura and without status migrainosus    Her headaches are likely migraine Has sumatriptan to 100 mg, as needed for migraine Has about 2 episodes per week, added propranolol for migraine ppx Tylenol PRN for mild to moderate pain      Relevant Medications   propranolol (INDERAL) 20 MG tablet   Other Relevant Orders   TSH     Respiratory   Mild persistent asthma, uncomplicated    Better controlled with Advair and PRN Albuterol now      Relevant Orders   CBC with  Differential/Platelet     Other   Morbid obesity ([place])    Has been less active since delivery Plans to resume exercise DASH diet and moderate exercise/walking at least 150 mins/week She is interested in GLP-1 agonist therapy, but insurance coverage is concern      Vitamin D deficiency    Last vitamin D Lab Results  Component Value Date   VD25OH 12.5 (L) 09/28/2020  Had Vitamin D 50,000 IU once weekly Switched to Vitamin D 5000 IU QD now      Relevant Orders   VITAMIN D 25 Hydroxy (Vit-D Deficiency, Fractures)   Restless legs    Overall well controlled with ropinirole      Relevant Medications   rOPINIRole (REQUIP) 1 MG tablet   Other Relevant Orders   TSH   Mixed hyperlipidemia    Advised to follow low cholesterol diet for now      Relevant Medications   propranolol (INDERAL) 20 MG tablet   Other Relevant Orders   Lipid panel   Other Visit Diagnoses     Hyperglycemia       Relevant Orders   Hemoglobin A1c       Meds ordered this encounter  Medications   propranolol (INDERAL) 20 MG tablet    Sig: Take 1 tablet (20 mg total) by mouth 2 (two) times daily.    Dispense:  60 tablet    Refill:  2   rOPINIRole (REQUIP) 1  MG tablet    Sig: Take 1 tablet (1 mg total) by mouth at bedtime.    Dispense:  30 tablet    Refill:  3    Follow-up: Return in about 4 weeks (around 06/12/2022) for Annual physical.    [name], MD

## 2022-05-16 NOTE — Assessment & Plan Note (Signed)
Last vitamin D Lab Results  Component Value Date   VD25OH 12.5 (L) 09/28/2020   Had Vitamin D 50,000 IU once weekly Switched to Vitamin D 5000 IU QD now

## 2022-05-21 ENCOUNTER — Ambulatory Visit: Payer: Medicaid Other | Admitting: Orthopedic Surgery

## 2022-05-29 ENCOUNTER — Ambulatory Visit (HOSPITAL_COMMUNITY): Payer: Medicaid Other

## 2022-05-29 ENCOUNTER — Encounter (HOSPITAL_COMMUNITY): Payer: Medicaid Other

## 2022-05-29 ENCOUNTER — Encounter (HOSPITAL_COMMUNITY): Payer: Medicaid Other | Admitting: Occupational Therapy

## 2022-06-01 ENCOUNTER — Other Ambulatory Visit: Payer: Medicaid Other

## 2022-06-01 NOTE — Patient Instructions (Signed)
  Medicaid Managed Care   Unsuccessful Outreach Note  06/01/2022 Name: [name] MRN: [id number] DOB: 1981-02-18  Referred by: [name], MD Reason for referral : High Risk Managed Medicaid (MM Social work telephone outreach)   An unsuccessful telephone outreach was attempted today. The patient was referred to the case management team for assistance with care management and care coordination.   Follow Up Plan: The patient has been provided with contact information for the care management team and has been advised to call with any health related questions or concerns.   [name], BSW, [place] Managed Medicaid Team  [phone]

## 2022-06-01 NOTE — Patient Outreach (Signed)
BSW outreached patient and phone was disconnected. BSW made another attempt with no answer.  [name], BSW, [place] Managed Medicaid Team  [phone]

## 2022-06-05 ENCOUNTER — Ambulatory Visit (HOSPITAL_COMMUNITY): Payer: BC Managed Care – PPO | Attending: Internal Medicine | Admitting: Occupational Therapy

## 2022-06-05 ENCOUNTER — Encounter (HOSPITAL_COMMUNITY): Payer: Self-pay | Admitting: Occupational Therapy

## 2022-06-05 DIAGNOSIS — G8929 Other chronic pain: Secondary | ICD-10-CM | POA: Diagnosis not present

## 2022-06-05 DIAGNOSIS — R29898 Other symptoms and signs involving the musculoskeletal system: Secondary | ICD-10-CM | POA: Insufficient documentation

## 2022-06-05 DIAGNOSIS — M25511 Pain in right shoulder: Secondary | ICD-10-CM | POA: Insufficient documentation

## 2022-06-05 DIAGNOSIS — M25611 Stiffness of right shoulder, not elsewhere classified: Secondary | ICD-10-CM | POA: Diagnosis not present

## 2022-06-05 NOTE — Patient Instructions (Signed)

## 2022-06-05 NOTE — Therapy (Signed)
OUTPATIENT OCCUPATIONAL THERAPY ORTHO EVALUATION  Patient Name: [name] MRN: [id number] DOB:1981-03-06, 42 y.o., female Today's Date: 06/05/2022  PCP: Dr. Ihor Dow REFERRING PROVIDER: Dr. Arther Abbott  END OF SESSION:  OT End of Session - 06/05/22 1036     Visit Number 2    Number of Visits 5    Date for OT Re-Evaluation 06/14/22    Authorization Type [place] Medicaid    Authorization Time Period Requesting 4 visits    Authorization - Visit Number 1    Authorization - Number of Visits 4    OT Start Time 1037    OT Stop Time 1115    OT Time Calculation (min) 38 min    Activity Tolerance Patient tolerated treatment well    Behavior During Therapy [facility] for tasks assessed/performed             Past Medical History:  Diagnosis Date   AKI (acute kidney injury) ([place]) 08/17/2021   Allergy    banana/pineapple   Anemia    post pregnancy   Anxiety    Asthma    Autoimmune disease, not elsewhere classified(279.49) 2013   Non specific- [facility], abnormal Bone Marrow signal   Breast lump in female 11/28/2012   Has tender round mass at 10-11 0'clock right breast 4 finger breaths from [place] will US   Breast mass, right 08/18/2012   Chlamydia    Chronic breast pain 05/31/2015   Dysmenorrhea 03/23/2014   Dyspareunia 01/05/2014   Elevated prolactin level 02/10/2021   Was 54.5, recheck in December ________   Fibroids, intramural 08/24/2015   GERD (gastroesophageal reflux disease)    Gonorrhea    Headache(784.0)    History of abnormal cervical Pap smear 04/02/2014   Hypertension    Kidney stone    06/2021   LLQ abdominal tenderness 05/31/2015   Menorrhagia 01/05/2014   Neuromuscular disorder ([place])    lower back and bilat leg pain   Obesity    Other and unspecified ovarian cyst 01/12/2014   Polyclonal gammopathy 07/26/2013   Insignificant   Pyelonephritis 06/28/2021   Recurrent upper respiratory infection (URI)    Scoliosis    Urticaria    Vaginal  irritation 09/30/2014   Vaginal Pap smear, abnormal    Past Surgical History:  Procedure Laterality Date   ADENOIDECTOMY     BREAST BIOPSY Left 02/2021   CYSTOSCOPY W/ URETERAL STENT PLACEMENT Left 06/29/2021   Procedure: CYSTOSCOPY WITH RETROGRADE PYELOGRAM/URETERAL STENT PLACEMENT;  Surgeon: Cleon Gustin, MD;  Location: [facility];  Service: Urology;  Laterality: Left;   CYSTOSCOPY WITH RETROGRADE PYELOGRAM, URETEROSCOPY AND STENT PLACEMENT Left 07/27/2021   Procedure: CYSTOSCOPY WITH RETROGRADE PYELOGRAM, URETEROSCOPY, STONE EXTRACTION WITH BASKET;  Surgeon: Cleon Gustin, MD;  Location: [facility];  Service: Urology;  Laterality: Left;   DILATATION AND CURETTAGE/HYSTEROSCOPY WITH MINERVA N/A 03/14/2021   Procedure: HYSTEROSCOPY WITH MINERVA;  Surgeon: Florian Buff, MD;  Location: [facility];  Service: Gynecology;  Laterality: N/A;   DILATION AND CURETTAGE OF UTERUS     LAPAROSCOPIC BILATERAL SALPINGECTOMY Bilateral 03/16/2020   Procedure: LAPAROSCOPIC BILATERAL SALPINGECTOMY;  Surgeon: Florian Buff, MD;  Location: [facility];  Service: Gynecology;  Laterality: Bilateral;   SHOULDER SURGERY Right 03/10/2019   TONSILLECTOMY     TUBAL LIGATION N/A    Phreesia 06/28/2020   Patient Active Problem List   Diagnosis Date Noted   Mixed hyperlipidemia 05/16/2022   Mass of upper outer quadrant of left breast 11/15/2021  Intractable migraine without aura and without status migrainosus 09/14/2021   Urge incontinence 09/14/2021   Hospital discharge follow-up 07/14/2021   Hydronephrosis 06/28/2021   Fibroadenoma of breast, left 03/31/2021   Restless legs 02/10/2021   Vitamin D deficiency 09/30/2020   Menorrhagia with irregular cycle 08/17/2020   Chronic right shoulder pain 07/01/2020   Mild persistent asthma, uncomplicated 46/56/8127   Abnormal uterine bleeding (AUB) 03/12/2017   Intramural leiomyoma of uterus 08/24/2015   Polyclonal gammopathy 07/26/2013   Dysplasia of cervix, unspecified  03/31/2013   Hypertension 09/21/2010   Morbid obesity ([place]) 11/03/2007    ONSET DATE: mid-2023  REFERRING DIAG: Chronic Right Shoulder Pain  THERAPY DIAG:  Chronic right shoulder pain  Other symptoms and signs involving the musculoskeletal system  Stiffness of right shoulder, not elsewhere classified  Rationale for Evaluation and Treatment: Rehabilitation  SUBJECTIVE:   SUBJECTIVE STATEMENT: S: It's been bothering me for about a year now.  Pt accompanied by: self  PERTINENT HISTORY: Pt is a 42 y/o female presenting with chronic right shoulder pain. Pt has a hx of right shoulder arthroscopic debridement and acromioplasty in 2020, had relief for approximately 1-2 years. Pt received an injection on 11/8 which helped for approximately 3 weeks. An MRI was recommended, pt has not scheduled yet. Pt has has increased pain over the past year.   PRECAUTIONS: None  WEIGHT BEARING RESTRICTIONS: No  PAIN:  Are you having pain? Yes: NPRS scale: 4/10 Pain location: right shoulder Pain description: aching, sore, sharp at times Aggravating factors: Constantly using it, cold Relieving factors: heating pad, icy hot  FALLS: Has patient fallen in last 6 months? No   PLOF: Independent  PATIENT GOALS: To have less pain the right arm  NEXT MD VISIT: 05/21/2022  OBJECTIVE:   HAND DOMINANCE: Right  ADLs: Overall ADLs: pt reports difficulty with reaching overhead and behind her back, lifting pots and pans, doing house work, lifting her 57 year old daughter. Pt reports sleeping is difficult, pt can sleep on her right side for about 30 minutes. 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    FUNCTIONAL OUTCOME MEASURES: Quick Dash: 70.45  UPPER EXTREMITY ROM:       Assessed seated, er/IR adducted  Active ROM Right eval  Shoulder flexion 111  Shoulder abduction 74  Shoulder internal rotation 90  Shoulder external rotation 60  (Blank rows = not  tested)   UPPER EXTREMITY MMT:       Assessed seated, er/IR adducted  MMT Right eval  Shoulder flexion 4/5  Shoulder abduction 4/5  Shoulder internal rotation 4+/5  Shoulder external rotation 4/5  (Blank rows = not tested)  HAND FUNCTION: Grip strength: Right: 45 lbs; Left: 100 lbs  SENSATION: WFL  EDEMA: None on eval, had some mild swelling over Christmas when using her arm more  COGNITION: Overall cognitive status: Within functional limits for tasks assessed  OBSERVATIONS: mod fascial restrictions in anterior shoulder and upper trapezius regions   TODAY'S TREATMENT:  DATE:   06/05/22 -Manual Therapy: myofascial release and trigger point applied to the biceps, trapezius, scapula, and subscapularis in order to address pain and fascial restrictions to improve ROM -AA/ROM: supine, abduction, flexion, protraction, horizontal abduction, er/IR, x10 -Ball Rolls: flexion and abduction, x10 each -Wall Slides: flexion and abduction, x10 each -Pulleys: flexion and abduction x60" each   PATIENT EDUCATION: Education details: AA/ROM, Verl [place]al[place] Slides Person educated: Patient Education method: Explanation, Demonstration, and Handouts Education comprehension: verbalized understanding and returned demonstration  HOME EXERCISE PROGRAM: Eval: table slides 2/6: AA/ROM, Wall Slides  GOALS: Goals reviewed with patient? Yes  SHORT TERM GOALS: Target date: 06/14/22  Pt will be provided with and educated on HEP to improve mobility in RUE required for use during ADL completion.   Goal status: IN PROGRESS  2.  Pt will increase RUE P/ROM by at least 45 degrees to improve ability to use RUE during dressing and bathing tasks with minimal compensatory techniques.   Goal status: IN PROGRESS  3.  Pt will increase RUE strength to 4+/5 to improve ability to perform  housework and caregiver tasks for her daughter.   Goal status: IN PROGRESS  4.  Pt will decrease pain in RUE to 3/10 or less to improve ability to sleep for 2+ consecutive hours not waking due to pain.   Goal status: IN PROGRESS  5.  Pt will decrease RUE fascial restrictions to min amounts or less to improve ability to perform functional reaching tasks.   Goal status: IN PROGRESS     ASSESSMENT:  CLINICAL IMPRESSION: Pt presented to this session with limited ROM and mild pain. Found to have moderate to severe fascial restrictions along the biceps, trapezius, and scapula, addressed with myofacial release and trigger point massage. Pt reaching approximately 60% of full ROM with AA/ROM this session, with increased stretching during ball rolls and wall slides. OT providing verbal and tactile cuing during all tasks for positioning and technique.   PERFORMANCE DEFICITS: in functional skills including ADLs, IADLs, ROM, strength, pain, fascial restrictions, and UE functional use    PLAN:  OT FREQUENCY: 1x/week  OT DURATION: 4 weeks  PLANNED INTERVENTIONS: self care/ADL training, therapeutic exercise, therapeutic activity, manual therapy, electrical stimulation, ultrasound, moist heat, patient/family education, and DME and/or AE instructions  RECOMMENDED OTHER SERVICES: N/A  CONSULTED AND AGREED WITH PLAN OF CARE: Patient  PLAN FOR NEXT SESSION: Follow up on HEP, manual techniques, AA/ROM, pulleys, therapy ball rolls, wall slides, [place], OTR/L [phone] 06/05/2022, 10:37 AM

## 2022-06-11 DIAGNOSIS — R739 Hyperglycemia, unspecified: Secondary | ICD-10-CM | POA: Diagnosis not present

## 2022-06-11 DIAGNOSIS — E782 Mixed hyperlipidemia: Secondary | ICD-10-CM | POA: Diagnosis not present

## 2022-06-11 DIAGNOSIS — E559 Vitamin D deficiency, unspecified: Secondary | ICD-10-CM | POA: Diagnosis not present

## 2022-06-11 DIAGNOSIS — G43019 Migraine without aura, intractable, without status migrainosus: Secondary | ICD-10-CM | POA: Diagnosis not present

## 2022-06-11 DIAGNOSIS — I10 Essential (primary) hypertension: Secondary | ICD-10-CM | POA: Diagnosis not present

## 2022-06-11 DIAGNOSIS — G2581 Restless legs syndrome: Secondary | ICD-10-CM | POA: Diagnosis not present

## 2022-06-11 DIAGNOSIS — J4530 Mild persistent asthma, uncomplicated: Secondary | ICD-10-CM | POA: Diagnosis not present

## 2022-06-12 ENCOUNTER — Ambulatory Visit: Payer: Medicaid Other | Admitting: Internal Medicine

## 2022-06-12 ENCOUNTER — Encounter: Payer: Self-pay | Admitting: Internal Medicine

## 2022-06-12 ENCOUNTER — Ambulatory Visit (HOSPITAL_COMMUNITY): Payer: BC Managed Care – PPO | Admitting: Occupational Therapy

## 2022-06-12 ENCOUNTER — Encounter (HOSPITAL_COMMUNITY): Payer: Self-pay | Admitting: Occupational Therapy

## 2022-06-12 VITALS — BP 126/88 | HR 100 | Ht 65.0 in | Wt 306.6 lb

## 2022-06-12 DIAGNOSIS — I10 Essential (primary) hypertension: Secondary | ICD-10-CM

## 2022-06-12 DIAGNOSIS — G43019 Migraine without aura, intractable, without status migrainosus: Secondary | ICD-10-CM

## 2022-06-12 DIAGNOSIS — M25611 Stiffness of right shoulder, not elsewhere classified: Secondary | ICD-10-CM | POA: Diagnosis not present

## 2022-06-12 DIAGNOSIS — E559 Vitamin D deficiency, unspecified: Secondary | ICD-10-CM

## 2022-06-12 DIAGNOSIS — G8929 Other chronic pain: Secondary | ICD-10-CM | POA: Diagnosis not present

## 2022-06-12 DIAGNOSIS — F5101 Primary insomnia: Secondary | ICD-10-CM | POA: Diagnosis not present

## 2022-06-12 DIAGNOSIS — R29898 Other symptoms and signs involving the musculoskeletal system: Secondary | ICD-10-CM | POA: Diagnosis not present

## 2022-06-12 DIAGNOSIS — M25511 Pain in right shoulder: Secondary | ICD-10-CM | POA: Diagnosis not present

## 2022-06-12 LAB — CMP14+EGFR
ALT: 5 IU/L (ref 0–32)
AST: 10 IU/L (ref 0–40)
Albumin/Globulin Ratio: 1.2 (ref 1.2–2.2)
Albumin: 3.7 g/dL — ABNORMAL LOW (ref 3.9–4.9)
Alkaline Phosphatase: 97 IU/L (ref 44–121)
BUN/Creatinine Ratio: 11 (ref 9–23)
BUN: 8 mg/dL (ref 6–20)
Bilirubin Total: 0.5 mg/dL (ref 0.0–1.2)
CO2: 21 mmol/L (ref 20–29)
Calcium: 8.7 mg/dL (ref 8.7–10.2)
Chloride: 103 mmol/L (ref 96–106)
Creatinine, Ser: 0.72 mg/dL (ref 0.57–1.00)
Globulin, Total: 3 g/dL (ref 1.5–4.5)
Glucose: 99 mg/dL (ref 70–99)
Potassium: 3.7 mmol/L (ref 3.5–5.2)
Sodium: 138 mmol/L (ref 134–144)
Total Protein: 6.7 g/dL (ref 6.0–8.5)
eGFR: 109 mL/min/{1.73_m2} (ref >59)

## 2022-06-12 LAB — TSH: TSH: 1.72 u[IU]/mL (ref 0.450–4.500)

## 2022-06-12 LAB — CBC WITH DIFFERENTIAL/PLATELET
Basophils Absolute: 0.1 10*3/uL (ref 0.0–0.2)
Basos: 1 %
EOS (ABSOLUTE): 0.6 10*3/uL — ABNORMAL HIGH (ref 0.0–0.4)
Eos: 6 %
Hematocrit: 37.7 % (ref 34.0–46.6)
Hemoglobin: 12.8 g/dL (ref 11.1–15.9)
Immature Grans (Abs): 0.1 10*3/uL (ref 0.0–0.1)
Immature Granulocytes: 1 %
Lymphocytes Absolute: 2.4 10*3/uL (ref 0.7–3.1)
Lymphs: 27 %
MCH: 29.6 pg (ref 26.6–33.0)
MCHC: 34 g/dL (ref 31.5–35.7)
MCV: 87 fL (ref 79–97)
Monocytes Absolute: 0.4 10*3/uL (ref 0.1–0.9)
Monocytes: 5 %
Neutrophils Absolute: 5.3 10*3/uL (ref 1.4–7.0)
Neutrophils: 60 %
Platelets: 353 10*3/uL (ref 150–450)
RBC: 4.32 x10E6/uL (ref 3.77–5.28)
RDW: 11.9 % (ref 11.7–15.4)
WBC: 8.8 10*3/uL (ref 3.4–10.8)

## 2022-06-12 LAB — VITAMIN D 25 HYDROXY (VIT D DEFICIENCY, FRACTURES): Vit D, 25-Hydroxy: 9.3 ng/mL — ABNORMAL LOW (ref 30.0–100.0)

## 2022-06-12 LAB — LIPID PANEL
Chol/HDL Ratio: 2.3 ratio (ref 0.0–4.4)
Cholesterol, Total: 130 mg/dL (ref 100–199)
HDL: 56 mg/dL (ref >39)
LDL Chol Calc (NIH): 61 mg/dL (ref 0–99)
Triglycerides: 62 mg/dL (ref 0–149)
VLDL Cholesterol Cal: 13 mg/dL (ref 5–40)

## 2022-06-12 LAB — HEMOGLOBIN A1C
Est. average glucose Bld gHb Est-mCnc: 108 mg/dL
Hgb A1c MFr Bld: 5.4 % (ref 4.8–5.6)

## 2022-06-12 MED ORDER — PHENTERMINE HCL 37.5 MG PO TABS: 18.7500 mg | ORAL_TABLET | Freq: Every day | ORAL | 0 refills | Status: DC

## 2022-06-12 MED ORDER — AMLODIPINE BESYLATE 5 MG PO TABS: 5.0000 mg | ORAL_TABLET | Freq: Every day | ORAL | 1 refills | Status: AC

## 2022-06-12 MED ORDER — TRAZODONE HCL 50 MG PO TABS: 25.0000 mg | ORAL_TABLET | Freq: Every evening | ORAL | 3 refills | Status: DC | PRN

## 2022-06-12 MED ORDER — VITAMIN D (ERGOCALCIFEROL) 1.25 MG (50000 UNIT) PO CAPS: 50000.0000 [IU] | ORAL_CAPSULE | ORAL | 1 refills | Status: DC

## 2022-06-12 MED ORDER — HYDROCHLOROTHIAZIDE 12.5 MG PO CAPS: 12.5000 mg | ORAL_CAPSULE | Freq: Every day | ORAL | 1 refills | Status: AC

## 2022-06-12 NOTE — Assessment & Plan Note (Signed)
Was on Ambien in the past Added Trazodone PRN for now Sleep hygiene material provided

## 2022-06-12 NOTE — Patient Instructions (Signed)
Please start taking Phentermine as prescribed.  Please continue taking other medications as prescribed.  Please follow DASH diet and perform moderate exercise/walking at least 150 mins/week.

## 2022-06-12 NOTE — Assessment & Plan Note (Signed)
BP Readings from Last 1 Encounters:  06/12/22 126/88   Well-controlled with Amlodipine and HCTZ Counseled for compliance with the medications Advised DASH diet and moderate exercise/walking, at least 150 mins/week

## 2022-06-12 NOTE — Assessment & Plan Note (Signed)
Her headaches are likely migraine Has sumatriptan to 100 mg, as needed for migraine Has about 2 episodes per week, added propranolol for migraine ppx, but had nausea with it If persistent concern, will add Topiramate Tylenol PRN for mild to moderate pain

## 2022-06-12 NOTE — Therapy (Signed)
OUTPATIENT OCCUPATIONAL THERAPY ORTHO TREATMENT  Patient Name: [name] MRN: [id number] DOB:12-03-1980, 42 y.o., female Today's Date: 06/12/2022  PCP: Dr. Ihor Dow REFERRING PROVIDER: Dr. Arther Abbott  END OF SESSION:  OT End of Session - 06/12/22 1110     Visit Number 3    Number of Visits 5    Date for OT Re-Evaluation 06/14/22    Authorization Type [place] Medicaid    Authorization Time Period 8 visits approved 1/15-3/14/24    Authorization - Visit Number 2    Authorization - Number of Visits 8    OT Start Time 1030    OT Stop Time 1109    OT Time Calculation (min) 39 min    Activity Tolerance Patient tolerated treatment well    Behavior During Therapy [facility] for tasks assessed/performed              Past Medical History:  Diagnosis Date   AKI (acute kidney injury) ([place]) 08/17/2021   Allergy    banana/pineapple   Anemia    post pregnancy   Anxiety    Asthma    Autoimmune disease, not elsewhere classified(279.49) 2013   Non specific- [facility], abnormal Bone Marrow signal   Breast lump in female 11/28/2012   Has tender round mass at 10-11 0'clock right breast 4 finger breaths from [place] will US   Breast mass, right 08/18/2012   Chlamydia    Chronic breast pain 05/31/2015   Dysmenorrhea 03/23/2014   Dyspareunia 01/05/2014   Elevated prolactin level 02/10/2021   Was 54.5, recheck in December ________   Fibroids, intramural 08/24/2015   GERD (gastroesophageal reflux disease)    Gonorrhea    Headache(784.0)    History of abnormal cervical Pap smear 04/02/2014   Hypertension    Kidney stone    06/2021   LLQ abdominal tenderness 05/31/2015   Menorrhagia 01/05/2014   Neuromuscular disorder (HCC)    lower back and bilat leg pain   Obesity    Other and unspecified ovarian cyst 01/12/2014   Polyclonal gammopathy 07/26/2013   Insignificant   Pyelonephritis 06/28/2021   Recurrent upper respiratory infection (URI)    Scoliosis     Urticaria    Vaginal irritation 09/30/2014   Vaginal Pap smear, abnormal    Past Surgical History:  Procedure Laterality Date   ADENOIDECTOMY     BREAST BIOPSY Left 02/2021   CYSTOSCOPY W/ URETERAL STENT PLACEMENT Left 06/29/2021   Procedure: CYSTOSCOPY WITH RETROGRADE PYELOGRAM/URETERAL STENT PLACEMENT;  Surgeon: Cleon Gustin, MD;  Location: [facility];  Service: Urology;  Laterality: Left;   CYSTOSCOPY WITH RETROGRADE PYELOGRAM, URETEROSCOPY AND STENT PLACEMENT Left 07/27/2021   Procedure: CYSTOSCOPY WITH RETROGRADE PYELOGRAM, URETEROSCOPY, STONE EXTRACTION WITH BASKET;  Surgeon: Cleon Gustin, MD;  Location: [facility];  Service: Urology;  Laterality: Left;   DILATATION AND CURETTAGE/HYSTEROSCOPY WITH MINERVA N/A 03/14/2021   Procedure: HYSTEROSCOPY WITH MINERVA;  Surgeon: Florian Buff, MD;  Location: [facility];  Service: Gynecology;  Laterality: N/A;   DILATION AND CURETTAGE OF UTERUS     LAPAROSCOPIC BILATERAL SALPINGECTOMY Bilateral 03/16/2020   Procedure: LAPAROSCOPIC BILATERAL SALPINGECTOMY;  Surgeon: Florian Buff, MD;  Location: [facility];  Service: Gynecology;  Laterality: Bilateral;   SHOULDER SURGERY Right 03/10/2019   TONSILLECTOMY     TUBAL LIGATION N/A    [employer] 06/28/2020   Patient Active Problem List   Diagnosis Date Noted   Mixed hyperlipidemia 05/16/2022   Mass of upper outer quadrant of left breast 11/15/2021  Intractable migraine without aura and without status migrainosus 09/14/2021   Urge incontinence 09/14/2021   Hospital discharge follow-up 07/14/2021   Hydronephrosis 06/28/2021   Fibroadenoma of breast, left 03/31/2021   Restless legs 02/10/2021   Vitamin D deficiency 09/30/2020   Menorrhagia with irregular cycle 08/17/2020   Chronic right shoulder pain 07/01/2020   Mild persistent asthma, uncomplicated [id number]   Abnormal uterine bleeding (AUB) 03/12/2017   Intramural leiomyoma of uterus 08/24/2015   Polyclonal gammopathy 07/26/2013   Dysplasia  of cervix, unspecified 03/31/2013   Hypertension 09/21/2010   Morbid obesity ([place]) 11/03/2007    ONSET DATE: mid-2023  REFERRING DIAG: Chronic Right Shoulder Pain  THERAPY DIAG:  Chronic right shoulder pain  Other symptoms and signs involving the musculoskeletal system  Stiffness of right shoulder, not elsewhere classified  Rationale for Evaluation and Treatment: Rehabilitation  SUBJECTIVE:   SUBJECTIVE STATEMENT: S: It's been bothering me for about a year now.  Pt accompanied by: self  PERTINENT HISTORY: Pt is a 42 y/o female presenting with chronic right shoulder pain. Pt has a hx of right shoulder arthroscopic debridement and acromioplasty in 2020, had relief for approximately 1-2 years. Pt received an injection on 11/8 which helped for approximately 3 weeks. An MRI was recommended, pt has not scheduled yet. Pt has has increased pain over the past year.   PRECAUTIONS: None  WEIGHT BEARING RESTRICTIONS: No  PAIN:  Are you having pain? Yes: NPRS scale: 4/10 Pain location: right shoulder Pain description: aching, sore, sharp at times Aggravating factors: Constantly using it, cold Relieving factors: heating pad, icy hot  FALLS: Has patient fallen in last 6 months? No  PATIENT GOALS: To have less pain the right arm  NEXT MD VISIT: None scheduled  OBJECTIVE:   HAND DOMINANCE: Right  ADLs: Overall ADLs: pt reports difficulty with reaching overhead and behind her back, lifting pots and pans, doing house work, lifting her 73 year old daughter. Pt reports sleeping is difficult, pt can sleep on her right side for about 30 minutes. 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    FUNCTIONAL OUTCOME MEASURES: Quick Dash: 70.45  UPPER EXTREMITY ROM:       Assessed seated, er/IR adducted  Active ROM Right eval  Shoulder flexion 111  Shoulder abduction 74  Shoulder internal rotation 90  Shoulder external rotation 60  (Blank rows = not  tested)   UPPER EXTREMITY MMT:       Assessed seated, er/IR adducted  MMT Right eval  Shoulder flexion 4/5  Shoulder abduction 4/5  Shoulder internal rotation 4+/5  Shoulder external rotation 4/5  (Blank rows = not tested)  HAND FUNCTION: Grip strength: Right: 45 lbs; Left: 100 lbs  SENSATION: WFL  EDEMA: None on eval, had some mild swelling over Christmas when using her arm more  COGNITION: Overall cognitive status: Within functional limits for tasks assessed  OBSERVATIONS: mod fascial restrictions in anterior shoulder and upper trapezius regions   TODAY'S TREATMENT:  DATE:  06/12/22 -Manual Therapy: myofascial release and trigger point applied to the biceps, trapezius, scapula, and subscapularis in order to address pain and fascial restrictions to improve ROM -P/ROM: supine-flexion, abduction, er/IR, horizontal abduction, 5 reps -AA/ROM: supine-abduction, flexion, protraction, horizontal abduction, er/IR, x10 -Shoulder stretches: flexion, abduction, doorway stretch, IR behind back with horizontal towel, cross chest stretch, 2x15" holds -AA/ROM: standing-abduction, flexion, protraction, horizontal abduction, er/IR, x10 -Wall wash: 1' flexion -Pulleys: 1' flexion, 1' abduction  06/05/22 -Manual Therapy: myofascial release and trigger point applied to the biceps, trapezius, scapula, and subscapularis in order to address pain and fascial restrictions to improve ROM -AA/ROM: supine, abduction, flexion, protraction, horizontal abduction, er/IR, x10 -Ball Rolls: flexion and abduction, x10 each -Wall Slides: flexion and abduction, x10 each -Pulleys: flexion and abduction x60" each   PATIENT EDUCATION: Education details: AA/ROM, Verl Blalock Slides Person educated: Patient Education method: Explanation, Demonstration, and Handouts Education comprehension:  verbalized understanding and returned demonstration  HOME EXERCISE PROGRAM: Eval: table slides 2/6: AA/ROM, Wall Slides  GOALS: Goals reviewed with patient? Yes  SHORT TERM GOALS: Target date: 06/14/22  Pt will be provided with and educated on HEP to improve mobility in RUE required for use during ADL completion.   Goal status: IN PROGRESS  2.  Pt will increase RUE A/ROM by at least 45 degrees to improve ability to use RUE during dressing and bathing tasks with minimal compensatory techniques.   Goal status: IN PROGRESS  3.  Pt will increase RUE strength to 4+/5 to improve ability to perform housework and caregiver tasks for her daughter.   Goal status: IN PROGRESS  4.  Pt will decrease pain in RUE to 3/10 or less to improve ability to sleep for 2+ consecutive hours not waking due to pain.   Goal status: IN PROGRESS  5.  Pt will decrease RUE fascial restrictions to min amounts or less to improve ability to perform functional reaching tasks.   Goal status: IN PROGRESS     ASSESSMENT:  CLINICAL IMPRESSION: Pt reports HEP is going well, arm feels heavy frequently throughout the day. Continued with manual techniques, passive stretching, AA/ROM adding in standing. Pt with pain at anterior deltoid intermittently throughout tasks.  Added shoulder stretches, wall wash, and completed pulleys this session.Pt with good available ROM however is pain limited. Alternated exercises with stretches during session. Verbal cuing for form and technique. Pt reporting heavy feeling during session, intermittent pain/discomfort when pushing end range, verbal cuing to go to stretch but not to severe pain.   PERFORMANCE DEFICITS: in functional skills including ADLs, IADLs, ROM, strength, pain, fascial restrictions, and UE functional use    PLAN:  OT FREQUENCY: 1x/week  OT DURATION: 4 weeks  PLANNED INTERVENTIONS: self care/ADL training, therapeutic exercise, therapeutic activity, manual  therapy, electrical stimulation, ultrasound, moist heat, patient/family education, and DME and/or AE instructions  CONSULTED AND AGREED WITH PLAN OF CARE: Patient  PLAN FOR NEXT SESSION:  manual techniques, AA/ROM, pulleys, shoulder stretches. Update HEP for shoulder stretches   Guadelupe Sabin, OTR/L  [phone] 06/12/2022, 11:11 AM

## 2022-06-12 NOTE — Assessment & Plan Note (Addendum)
BMI Readings from Last 3 Encounters:  06/12/22 51.02 kg/m  05/15/22 50.02 kg/m  03/07/22 44.93 kg/m   Has been less active since delivery Plans to resume exercise DASH diet and moderate exercise/walking at least 150 mins/week She is interested in GLP-1 agonist therapy, but insurance coverage is concern Added Phentermine after discussion of medical options

## 2022-06-12 NOTE — Assessment & Plan Note (Signed)
Last vitamin D Lab Results  Component Value Date   VD25OH 9.3 (L) 06/11/2022   Started Vitamin D 50,000 IU once weekly

## 2022-06-12 NOTE — Progress Notes (Signed)
Established Patient Office Visit  Subjective:  Patient ID: [name], female    DOB: 09/30/1980  Age: 42 y.o. MRN: [id number]  CC:  Chief Complaint  Patient presents with   Insomnia    Patient has trouble falling asleep or staying asleep     HPI [name] is a 42 y.o. female with past medical history of hypertension, asthma, gestational diabetes and morbid obesity who presents for f/u of her chronic medical conditions.  HTN: Her BP was more borderline elevated today.  She reports that her BP has been running wnl at home now.  She has an automatic wrist cuff at home.  She has headache at times (migraine), but denies any chest pain, dyspnea or palpitations currently. Of note, she was placed on propranolol for migraine, which she did not tolerate - had nausea,  Migraine: She complained of about 2 episodes of migraine every week, but has been better since the last visit.  She has tried taking sumatriptan, which helps at 100 mg dose. She complains of intermittent headache, unilateral at times, lasting for few hours to a day and is associated with dizziness and nausea.  She denies any photophobia or phonophobia currently.  Obesity: She states that she has been trying to follow healthy diet to lose weight.  She has not been able to lose weight.   Restless leg syndrome: She has noticed improvement in her leg pain/cramps with ropinirole.  She reports difficulty initiating and maintaining sleep.  She was on Ambien in the past.  She has tried melatonin without much relief.  Denies anhedonia, difficulty, anxiety spells, SI or HI currently.  Past Medical History:  Diagnosis Date   AKI (acute kidney injury) ([place]) 08/17/2021   Allergy    banana/pineapple   Anemia    post pregnancy   Anxiety    Asthma    Autoimmune disease, not elsewhere classified(279.49) 2013   Non specific- [facility], abnormal Bone Marrow signal   Breast lump in female 11/28/2012   Has tender  round mass at 10-11 0'clock right breast 4 finger breaths from [place] will US   Breast mass, right 08/18/2012   Chlamydia    Chronic breast pain 05/31/2015   Dysmenorrhea 03/23/2014   Dyspareunia 01/05/2014   Elevated prolactin level 02/10/2021   Was 54.5, recheck in December ________   Fibroids, intramural 08/24/2015   GERD (gastroesophageal reflux disease)    Gonorrhea    Headache(784.0)    History of abnormal cervical Pap smear 04/02/2014   Hypertension    Kidney stone    06/2021   LLQ abdominal tenderness 05/31/2015   Menorrhagia 01/05/2014   Neuromuscular disorder (HCC)    lower back and bilat leg pain   Obesity    Other and unspecified ovarian cyst 01/12/2014   Polyclonal gammopathy 07/26/2013   Insignificant   Pyelonephritis 06/28/2021   Recurrent upper respiratory infection (URI)    Scoliosis    Urticaria    Vaginal irritation 09/30/2014   Vaginal Pap smear, abnormal     Past Surgical History:  Procedure Laterality Date   ADENOIDECTOMY     BREAST BIOPSY Left 02/2021   CYSTOSCOPY W/ URETERAL STENT PLACEMENT Left 06/29/2021   Procedure: CYSTOSCOPY WITH RETROGRADE PYELOGRAM/URETERAL STENT PLACEMENT;  Surgeon: Cleon Gustin, MD;  Location: [facility];  Service: Urology;  Laterality: Left;   CYSTOSCOPY WITH RETROGRADE PYELOGRAM, URETEROSCOPY AND STENT PLACEMENT Left 07/27/2021   Procedure: CYSTOSCOPY WITH RETROGRADE PYELOGRAM, URETEROSCOPY, STONE EXTRACTION WITH BASKET;  Surgeon:  McKenzie, [name], MD;  Location: [facility];  Service: Urology;  Laterality: Left;   DILATATION AND CURETTAGE/HYSTEROSCOPY WITH MINERVA N/A 03/14/2021   Procedure: HYSTEROSCOPY WITH MINERVA;  Surgeon: Florian Buff, MD;  Location: [facility];  Service: Gynecology;  Laterality: N/A;   DILATION AND CURETTAGE OF UTERUS     LAPAROSCOPIC BILATERAL SALPINGECTOMY Bilateral 03/16/2020   Procedure: LAPAROSCOPIC BILATERAL SALPINGECTOMY;  Surgeon: Florian Buff, MD;  Location: [facility];  Service: Gynecology;   Laterality: Bilateral;   SHOULDER SURGERY Right 03/10/2019   TONSILLECTOMY     TUBAL LIGATION N/A    Phreesia 06/28/2020    Family History  Problem Relation Age of Onset   Hypertension Mother    Diabetes Mother    Heart disease Mother    Diabetes Father    Cancer Paternal Aunt        breast   Hypertension Maternal Grandmother    Diabetes Son        pre diabetic   Other Son        overactive bladder   COPD Maternal Aunt    Leukemia Maternal Aunt    Allergic rhinitis Neg Hx    Angioedema Neg Hx    Asthma Neg Hx    Atopy Neg Hx    Eczema Neg Hx    Immunodeficiency Neg Hx    Urticaria Neg Hx     Social History   Socioeconomic History   Marital status: Legally Separated    Spouse name: Jameel   Number of children: 2   Years of education: Not on file   Highest education level: Not on file  Occupational History   Not on file  Tobacco Use   Smoking status: Never   Smokeless tobacco: Never  Vaping Use   Vaping Use: Never used  Substance and Sexual Activity   Alcohol use: No   Drug use: No   Sexual activity: Yes    Birth control/protection: Surgical    Comment: tubal & ablation  Other Topics Concern   Not on file  Social History Narrative   Not on file   Social Determinants of Health   Financial Resource Strain: Medium Risk (02/12/2022)   Overall Financial Resource Strain (CARDIA)    Difficulty of Paying Living Expenses: Somewhat hard  Food Insecurity: No Food Insecurity (02/12/2022)   Hunger Vital Sign    Worried About Running Out of Food in the Last Year: Never true    Ran Out of Food in the Last Year: Never true  Transportation Needs: No Transportation Needs (04/20/2022)   PRAPARE - Hydrologist (Medical): No    Lack of Transportation (Non-Medical): No  Physical Activity: Insufficiently Active (02/23/2020)   Exercise Vital Sign    Days of Exercise per Week: 2 days    Minutes of Exercise per Session: 10 min  Stress: Stress  Concern Present (09/02/2021)   [place]    Feeling of Stress : Rather much  Social Connections: Unknown (09/01/2021)   Social Connection and Isolation Panel [NHANES]    Frequency of Communication with Friends and Family: More than three times a week    Frequency of Social Gatherings with Friends and Family: Three times a week    Attends Religious Services: 1 to 4 times per year    Active Member of Clubs or Organizations: No    Attends Archivist Meetings: Never    Marital Status: Not on  file  Intimate Partner Violence: Not At Risk (09/01/2021)   Humiliation, Afraid, Rape, and Kick questionnaire    Fear of Current or Ex-Partner: No    Emotionally Abused: No    Physically Abused: No    Sexually Abused: No    Outpatient Medications Prior to Visit  Medication Sig Dispense Refill   albuterol (PROVENTIL HFA) 108 (90 Base) MCG/ACT inhaler Inhale 2 puffs into the lungs every 6 (six) hours as needed for wheezing (cough). 8 g 5   Blood Pressure Monitoring (BLOOD PRESSURE CUFF) MISC Large cuff. ICD10: I10. Check blood pressure once daily. Please contact office if blood pressure above 150/90. 1 each 0   EPINEPHrine 0.3 mg/0.3 mL IJ SOAJ injection Inject 0.3 mg into the muscle as needed for anaphylaxis. 1 each 1   fluticasone-salmeterol (ADVAIR HFA) 115-21 MCG/ACT inhaler Inhale 2 puffs into the lungs 2 (two) times daily. (Patient taking differently: Inhale 2 puffs into the lungs daily.) 1 each 12   potassium chloride SA (KLOR-CON M) 20 MEQ tablet Take 1 tablet (20 mEq total) by mouth daily. 30 tablet 0   rOPINIRole (REQUIP) 1 MG tablet Take 1 tablet (1 mg total) by mouth at bedtime. 30 tablet 3   SUMAtriptan (IMITREX) 100 MG tablet Take 1 tablet (100 mg total) by mouth every 2 (two) hours as needed for migraine. May repeat in 2 hours if headache persists or recurs. 10 tablet 2   amLODipine (NORVASC) 5 MG tablet TAKE 1 TABLET BY  MOUTH DAILY. 90 tablet 0   hydrochlorothiazide (MICROZIDE) 12.5 MG capsule TAKE (1) CAPSULE BY MOUTH ONCE EVERY DAY. 90 capsule 0   propranolol (INDERAL) 20 MG tablet Take 1 tablet (20 mg total) by mouth 2 (two) times daily. 60 tablet 2   No facility-administered medications prior to visit.    Allergies  Allergen Reactions   Banana Rash   Pineapple Rash    ROS Review of Systems  Constitutional:  Negative for chills and fever.  HENT:  Negative for congestion, sinus pressure, sinus pain and sore throat.   Eyes:  Negative for pain and discharge.  Respiratory:  Negative for cough and wheezing.   Cardiovascular:  Positive for leg swelling. Negative for chest pain and palpitations.  Gastrointestinal:  Negative for abdominal pain, diarrhea, nausea and vomiting.  Endocrine: Negative for polydipsia and polyuria.  Genitourinary:  Negative for dysuria and hematuria.  Musculoskeletal:  Negative for neck pain and neck stiffness.       Leg pain  Skin:  Negative for rash.  Neurological:  Positive for headaches. Negative for dizziness and weakness.  Psychiatric/Behavioral:  Positive for sleep disturbance. Negative for agitation and behavioral problems.       Objective:    Physical Exam Vitals reviewed.  Constitutional:      General: She is not in acute distress.    Appearance: She is obese. She is not diaphoretic.  HENT:     Head: Normocephalic and atraumatic.     Nose: Nose normal. No congestion.     Mouth/Throat:     Mouth: Mucous membranes are moist.     Pharynx: No posterior oropharyngeal erythema.  Eyes:     General: No scleral icterus.    Extraocular Movements: Extraocular movements intact.  Cardiovascular:     Rate and Rhythm: Normal rate and regular rhythm.     Pulses: Normal pulses.     Heart sounds: Normal heart sounds. No murmur heard. Pulmonary:     Breath sounds: Normal breath sounds.  No wheezing or rales.  Musculoskeletal:     Cervical back: Neck supple. No  tenderness.     Right lower leg: No edema.     Left lower leg: No edema.  Skin:    General: Skin is warm.     Findings: No rash.  Neurological:     General: No focal deficit present.     Mental Status: She is alert and oriented to person, place, and time.     Sensory: No sensory deficit.     Motor: No weakness.  Psychiatric:        Mood and Affect: Mood normal.        Behavior: Behavior normal.     BP 126/88 (BP Location: Left Arm)   Pulse 100   Ht 5' 5"$  (1.651 m)   Wt (!) 306 lb 9.6 oz (139.1 kg)   SpO2 99%   BMI 51.02 kg/m  Wt Readings from Last 3 Encounters:  06/12/22 (!) 306 lb 9.6 oz (139.1 kg)  05/15/22 (!) 300 lb 9.6 oz (136.4 kg)  03/07/22 270 lb (122.5 kg)    Lab Results  Component Value Date   TSH 1.720 06/11/2022   Lab Results  Component Value Date   WBC 8.8 06/11/2022   HGB 12.8 06/11/2022   HCT 37.7 06/11/2022   MCV 87 06/11/2022   PLT 353 06/11/2022   Lab Results  Component Value Date   NA 138 06/11/2022   K 3.7 06/11/2022   CO2 21 06/11/2022   GLUCOSE 99 06/11/2022   BUN 8 06/11/2022   CREATININE 0.72 06/11/2022   BILITOT 0.5 06/11/2022   ALKPHOS 97 06/11/2022   AST 10 06/11/2022   ALT 5 06/11/2022   PROT 6.7 06/11/2022   ALBUMIN 3.7 (L) 06/11/2022   CALCIUM 8.7 06/11/2022   ANIONGAP 7 07/10/2021   EGFR 109 06/11/2022   Lab Results  Component Value Date   CHOL 130 06/11/2022   Lab Results  Component Value Date   HDL 56 06/11/2022   Lab Results  Component Value Date   LDLCALC 61 06/11/2022   Lab Results  Component Value Date   TRIG 62 06/11/2022   Lab Results  Component Value Date   CHOLHDL 2.3 06/11/2022   Lab Results  Component Value Date   HGBA1C 5.4 06/11/2022      Assessment & Plan:   Problem List Items Addressed This Visit       Cardiovascular and Mediastinum   Hypertension - Primary    BP Readings from Last 1 Encounters:  06/12/22 126/88  Well-controlled with Amlodipine and HCTZ Counseled for  compliance with the medications Advised DASH diet and moderate exercise/walking, at least 150 mins/week      Relevant Medications   amLODipine (NORVASC) 5 MG tablet   hydrochlorothiazide (MICROZIDE) 12.5 MG capsule   Intractable migraine without aura and without status migrainosus    Her headaches are likely migraine Has sumatriptan to 100 mg, as needed for migraine Has about 2 episodes per week, added propranolol for migraine ppx, but had nausea with it If persistent concern, will add Topiramate Tylenol PRN for mild to moderate pain      Relevant Medications   traZODone (DESYREL) 50 MG tablet   amLODipine (NORVASC) 5 MG tablet   hydrochlorothiazide (MICROZIDE) 12.5 MG capsule     Other   Morbid obesity (HCC)    BMI Readings from Last 3 Encounters:  06/12/22 51.02 kg/m  05/15/22 50.02 kg/m  03/07/22 44.93 kg/m  Has  been less active since delivery Plans to resume exercise DASH diet and moderate exercise/walking at least 150 mins/week She is interested in GLP-1 agonist therapy, but insurance coverage is concern Added Phentermine after discussion of medical options      Relevant Medications   phentermine (ADIPEX-P) 37.5 MG tablet   Primary insomnia    Was on Ambien in the past Added Trazodone PRN for now Sleep hygiene material provided      Relevant Medications   traZODone (DESYREL) 50 MG tablet   Vitamin D deficiency    Last vitamin D Lab Results  Component Value Date   VD25OH 9.3 (L) 06/11/2022  Started Vitamin D 50,000 IU once weekly      Relevant Medications   Vitamin D, Ergocalciferol, (DRISDOL) 1.25 MG (50000 UNIT) CAPS capsule    Meds ordered this encounter  Medications   Vitamin D, Ergocalciferol, (DRISDOL) 1.25 MG (50000 UNIT) CAPS capsule    Sig: Take 1 capsule (50,000 Units total) by mouth every 7 (seven) days.    Dispense:  12 capsule    Refill:  1   traZODone (DESYREL) 50 MG tablet    Sig: Take 0.5-1 tablets (25-50 mg total) by mouth at  bedtime as needed for sleep.    Dispense:  30 tablet    Refill:  3   amLODipine (NORVASC) 5 MG tablet    Sig: Take 1 tablet (5 mg total) by mouth daily.    Dispense:  90 tablet    Refill:  1   hydrochlorothiazide (MICROZIDE) 12.5 MG capsule    Sig: Take 1 capsule (12.5 mg total) by mouth daily.    Dispense:  90 capsule    Refill:  1   phentermine (ADIPEX-P) 37.5 MG tablet    Sig: Take 0.5 tablets (18.75 mg total) by mouth daily before breakfast.    Dispense:  30 tablet    Refill:  0    Follow-up: Return in about 2 months (around 08/11/2022) for HTN and weight management.    [name], MD

## 2022-06-14 ENCOUNTER — Ambulatory Visit (HOSPITAL_COMMUNITY)
Admission: RE | Admit: 2022-06-14 | Discharge: 2022-06-14 | Disposition: A | Discharge status: Home / Self Care | Payer: BC Managed Care – PPO | Source: Ambulatory Visit | Attending: Adult Health | Admitting: Adult Health

## 2022-06-14 ENCOUNTER — Encounter (HOSPITAL_COMMUNITY): Payer: Self-pay

## 2022-06-14 DIAGNOSIS — N6320 Unspecified lump in the left breast, unspecified quadrant: Secondary | ICD-10-CM

## 2022-06-14 DIAGNOSIS — N644 Mastodynia: Secondary | ICD-10-CM | POA: Diagnosis not present

## 2022-06-19 ENCOUNTER — Encounter (HOSPITAL_COMMUNITY): Payer: Self-pay

## 2022-06-19 ENCOUNTER — Ambulatory Visit (HOSPITAL_COMMUNITY): Payer: BC Managed Care – PPO | Admitting: Occupational Therapy

## 2022-06-28 ENCOUNTER — Encounter: Payer: Self-pay | Admitting: Radiology

## 2022-07-19 ENCOUNTER — Other Ambulatory Visit: Payer: Medicaid Other | Admitting: *Deleted

## 2022-07-19 NOTE — Patient Outreach (Signed)
  Medicaid Managed Care   Unsuccessful Attempt Note   07/19/2022 Name: [name] MRN: [id number] DOB: 29-Mar-1981  Referred by: [name], MD Reason for referral : High Risk Managed Medicaid (Unsuccessful RNCM follow up telephone outreach)   An unsuccessful telephone outreach was attempted today. The patient was referred to the case management team for assistance with care management and care coordination.    Follow Up Plan: A HIPAA compliant phone message was left for the patient providing contact information and requesting a return call. and The Managed Medicaid care management team will reach out to the patient again over the next 7 days.    [name] RN, BSN [place] [facility] RN Care Coordinator [phone]

## 2022-07-19 NOTE — Patient Instructions (Signed)
Visit Information  [name]  - as a part of your Medicaid benefit, you are eligible for care management and care coordination services at no cost or copay. I was unable to reach you by phone today but would be happy to help you with your health related needs. Please feel free to call me @ [phone].   A member of the Managed Medicaid care management team will reach out to you again over the next 7 days.   [name] RN, BSN [facility] RN Care Coordinator [phone]

## 2022-07-23 ENCOUNTER — Telehealth: Payer: Self-pay

## 2022-07-23 NOTE — Telephone Encounter (Signed)
..   Medicaid Managed Care   Unsuccessful Outreach Note  07/23/2022 Name: [name] MRN: [id number] DOB: 05/26/1980  Referred by: [name], MD Reason for referral : Appointment (I called the patient today to reschedule her missed phone appt with the MM RNCM. I left my name and number on her VM.)   A second unsuccessful telephone outreach was attempted today. The patient was referred to the case management team for assistance with care management and care coordination.   Follow Up Plan: The care management team will reach out to the patient again over the next 7 days.   [place]  [phone]

## 2022-07-30 ENCOUNTER — Other Ambulatory Visit: Payer: BC Managed Care – PPO | Admitting: *Deleted

## 2022-07-30 NOTE — Patient Outreach (Signed)
Care Coordination  07/30/2022  [name] Jun 06, 1980 [id number]   Medicaid Managed Care   Unsuccessful Outreach Note  07/30/2022 Name: [name] MRN: [id number] DOB: February 27, 1981  Referred by: [name], MD Reason for referral : Case Closure (RNCM performing Case Closure)   Third unsuccessful telephone outreach was attempted today. The patient was referred to the case management team for assistance with care management and care coordination. The patient's primary care provider has been notified of our unsuccessful attempts to make or maintain contact with the patient. The care management team is pleased to engage with this patient at any time in the future should he/she be interested in assistance from the care management team.   Follow Up Plan: We have been unable to make contact with the patient for follow up. The care management team is available to follow up with the patient after provider conversation with the patient regarding recommendation for care management engagement and subsequent re-referral to the care management team.   [name] RN, [facility] RN Care Coordinator [phone]

## 2022-08-13 ENCOUNTER — Ambulatory Visit: Payer: BC Managed Care – PPO | Admitting: Internal Medicine

## 2022-09-19 ENCOUNTER — Telehealth: Payer: Medicaid Other | Admitting: Physician Assistant

## 2022-09-19 DIAGNOSIS — B379 Candidiasis, unspecified: Secondary | ICD-10-CM

## 2022-09-19 DIAGNOSIS — T3695XA Adverse effect of unspecified systemic antibiotic, initial encounter: Secondary | ICD-10-CM | POA: Diagnosis not present

## 2022-09-19 MED ORDER — FLUCONAZOLE 150 MG PO TABS: ORAL_TABLET | ORAL | 0 refills | Status: DC

## 2022-09-19 NOTE — Progress Notes (Signed)
I have spent 5 minutes in review of e-visit questionnaire, review and updating patient chart, medical decision making and response to patient.   Canio Winokur Cody Tiaunna Buford, PA-C    

## 2022-09-19 NOTE — Progress Notes (Signed)

## 2022-10-23 ENCOUNTER — Telehealth: Payer: Medicaid Other | Admitting: Physician Assistant

## 2022-10-23 DIAGNOSIS — B9689 Other specified bacterial agents as the cause of diseases classified elsewhere: Secondary | ICD-10-CM | POA: Diagnosis not present

## 2022-10-23 DIAGNOSIS — N760 Acute vaginitis: Secondary | ICD-10-CM

## 2022-10-24 MED ORDER — METRONIDAZOLE 500 MG PO TABS
500.0000 mg | ORAL_TABLET | Freq: Two times a day (BID) | ORAL | 0 refills | Status: AC
Start: 2022-10-24 — End: 2022-10-31

## 2022-10-24 NOTE — Progress Notes (Signed)
E-Visit for Vaginal Symptoms  We are sorry that you are not feeling well. Here is how we plan to help! Based on what you shared with me it looks like you: May have a vaginosis due to bacteria  Vaginosis is an inflammation of the vagina that can result in discharge, itching and pain. The cause is usually a change in the normal balance of vaginal bacteria or an infection. Vaginosis can also result from reduced estrogen levels after menopause.  The most common causes of vaginosis are:   Bacterial vaginosis which results from an overgrowth of one on several organisms that are normally present in your vagina.   Yeast infections which are caused by a naturally occurring fungus called candida.   Vaginal atrophy (atrophic vaginosis) which results from the thinning of the vagina from reduced estrogen levels after menopause.   Trichomoniasis which is caused by a parasite and is commonly transmitted by sexual intercourse.  Factors that increase your risk of developing vaginosis include: Medications, such as antibiotics and steroids Uncontrolled diabetes Use of hygiene products such as bubble bath, vaginal spray or vaginal deodorant Douching Wearing damp or tight-fitting clothing Using an intrauterine device (IUD) for birth control Hormonal changes, such as those associated with pregnancy, birth control pills or menopause Sexual activity Having a sexually transmitted infection  Your treatment plan is Metronidazole or Flagyl 500mg twice a day for 7 days.  I have electronically sent this prescription into the pharmacy that you have chosen.  Be sure to take all of the medication as directed. Stop taking any medication if you develop a rash, tongue swelling or shortness of breath. Mothers who are breast feeding should consider pumping and discarding their breast milk while on these antibiotics. However, there is no consensus that infant exposure at these doses would be harmful.  Remember that  medication creams can weaken latex condoms. .   HOME CARE:  Good hygiene may prevent some types of vaginosis from recurring and may relieve some symptoms:  Avoid baths, hot tubs and whirlpool spas. Rinse soap from your outer genital area after a shower, and dry the area well to prevent irritation. Don't use scented or harsh soaps, such as those with deodorant or antibacterial action. Avoid irritants. These include scented tampons and pads. Wipe from front to back after using the toilet. Doing so avoids spreading fecal bacteria to your vagina.  Other things that may help prevent vaginosis include:  Don't douche. Your vagina doesn't require cleansing other than normal bathing. Repetitive douching disrupts the normal organisms that reside in the vagina and can actually increase your risk of vaginal infection. Douching won't clear up a vaginal infection. Use a latex condom. Both female and female latex condoms may help you avoid infections spread by sexual contact. Wear cotton underwear. Also wear pantyhose with a cotton crotch. If you feel comfortable without it, skip wearing underwear to bed. Yeast thrives in moist environments Your symptoms should improve in the next day or two.  GET HELP RIGHT AWAY IF:  You have pain in your lower abdomen ( pelvic area or over your ovaries) You develop nausea or vomiting You develop a fever Your discharge changes or worsens You have persistent pain with intercourse You develop shortness of breath, a rapid pulse, or you faint.  These symptoms could be signs of problems or infections that need to be evaluated by a medical provider now.  MAKE SURE YOU   Understand these instructions. Will watch your condition. Will get help right   away if you are not doing well or get worse.  Thank you for choosing an e-visit.  Your e-visit answers were reviewed by a board certified advanced clinical practitioner to complete your personal care plan. Depending upon the  condition, your plan could have included both over the counter or prescription medications.  Please review your pharmacy choice. Make sure the pharmacy is open so you can pick up prescription now. If there is a problem, you may contact your provider through MyChart messaging and have the prescription routed to another pharmacy.  Your safety is important to us. If you have drug allergies check your prescription carefully.   For the next 24 hours you can use MyChart to ask questions about today's visit, request a non-urgent call back, or ask for a work or school excuse. You will get an email in the next two days asking about your experience. I hope that your e-visit has been valuable and will speed your recovery.  I have spent 5 minutes in review of e-visit questionnaire, review and updating patient chart, medical decision making and response to patient.   Ruta Capece M Adrien Shankar, PA-C  

## 2022-10-26 ENCOUNTER — Telehealth: Payer: Medicaid Other | Admitting: Physician Assistant

## 2022-10-26 DIAGNOSIS — R399 Unspecified symptoms and signs involving the genitourinary system: Secondary | ICD-10-CM

## 2022-10-27 MED ORDER — FLUCONAZOLE 150 MG PO TABS: 150.0000 mg | ORAL_TABLET | Freq: Every day | ORAL | 0 refills | Status: DC

## 2022-10-27 MED ORDER — NITROFURANTOIN MONOHYD MACRO 100 MG PO CAPS: 100.0000 mg | ORAL_CAPSULE | Freq: Two times a day (BID) | ORAL | 0 refills | Status: AC

## 2022-10-27 NOTE — Progress Notes (Signed)
E-Visit for Urinary Problems  We are sorry that you are not feeling well.  Here is how we plan to help!  Based on what you shared with me it looks like you most likely have a simple urinary tract infection.  A UTI (Urinary Tract Infection) is a bacterial infection of the bladder.  Most cases of urinary tract infections are simple to treat but a key part of your care is to encourage you to drink plenty of fluids and watch your symptoms carefully.  I have prescribed MacroBid 100 mg twice a day for 5 days.  Your symptoms should gradually improve. Call us if the burning in your urine worsens, you develop worsening fever, back pain or pelvic pain or if your symptoms do not resolve after completing the antibiotic.  I have also prescribed diflucan.  Please only take if you develop vaginal yeast infection symptoms after starting antibiotic like increased thick vaginal discharge, increased vaginal itching or irritation.   Urinary tract infections can be prevented by drinking plenty of water to keep your body hydrated.  Also be sure when you wipe, wipe from front to back and don't hold it in!  If possible, empty your bladder every 4 hours.  HOME CARE Drink plenty of fluids Compete the full course of the antibiotics even if the symptoms resolve Remember, when you need to go.go. Holding in your urine can increase the likelihood of getting a UTI! GET HELP RIGHT AWAY IF: You cannot urinate You get a high fever Worsening back pain occurs You see blood in your urine You feel sick to your stomach or throw up You feel like you are going to pass out  MAKE SURE YOU  Understand these instructions. Will watch your condition. Will get help right away if you are not doing well or get worse.   Thank you for choosing an e-visit.  Your e-visit answers were reviewed by a board certified advanced clinical practitioner to complete your personal care plan. Depending upon the condition, your plan could have  included both over the counter or prescription medications.  Please review your pharmacy choice. Make sure the pharmacy is open so you can pick up prescription now. If there is a problem, you may contact your provider through [employer] and have the prescription routed to another pharmacy.  Your safety is important to [place]. If you have drug allergies check your prescription carefully.   For the next 24 hours you can use MyChart to ask questions about today's visit, request a non-urgent call back, or ask for a work or school excuse. You will get an email in the next two days asking about your experience. I hope that your e-visit has been valuable and will speed your recovery.   I have spent 5 minutes in review of e-visit questionnaire, review and updating patient chart, medical decision making and response to patient.   Tylene Fantasia Ward, PA-C

## 2022-11-25 ENCOUNTER — Telehealth: Payer: Medicaid Other | Admitting: Nurse Practitioner

## 2022-11-25 DIAGNOSIS — B9789 Other viral agents as the cause of diseases classified elsewhere: Secondary | ICD-10-CM

## 2022-11-25 DIAGNOSIS — J329 Chronic sinusitis, unspecified: Secondary | ICD-10-CM

## 2022-11-25 NOTE — Progress Notes (Signed)
E-Visit for Sinus Problems  We are sorry that you are not feeling well.  Here is how we plan to help!  Providers prescribe antibiotics to treat infections caused by bacteria. Antibiotics are very powerful in treating bacterial infections when they are used properly. To maintain their effectiveness, they should be used only when necessary. Overuse of antibiotics has resulted in the development of superbugs that are resistant to treatment!    After careful review of your answers, I would not recommend an antibiotic for your condition.  Antibiotics are not effective against viruses and therefore should not be used to treat them. Common examples of infections caused by viruses include colds and flu  Please feel free to reach out to [place] in 4-5 days if your symptoms have not resolved or have worsened. At this time even with a fever we would consider this viral sinusitis.   Based on what you have shared with me it looks like you have sinusitis.  Sinusitis is inflammation and infection in the sinus cavities of the head.  Based on your presentation I believe you most likely have Acute Viral Sinusitis.This is an infection most likely caused by a virus. There is not specific treatment for viral sinusitis other than to help you with the symptoms until the infection runs its course.  You may use an oral decongestant such as Mucinex D or if you have glaucoma or high blood pressure use plain Mucinex. Saline nasal spray help and can safely be used as often as needed for congestion, I recommend you continue your nose spray, decongestants and a humidifier.   Some authorities believe that zinc sprays or the use of Echinacea may shorten the course of your symptoms.  Sinus infections are not as easily transmitted as other respiratory infection, however we still recommend that you avoid close contact with loved ones, especially the very young and elderly.  Remember to wash your hands thoroughly throughout the day as this is  the number one way to prevent the spread of infection!  Home Care: Only take medications as instructed by your medical team. Do not take these medications with alcohol. A steam or ultrasonic humidifier can help congestion.  You can place a towel over your head and breathe in the steam from hot water coming from a faucet. Avoid close contacts especially the very young and the elderly. Cover your mouth when you cough or sneeze. Always remember to wash your hands.  Get Help Right Away If: You develop worsening fever or sinus pain. You develop a severe head ache or visual changes. Your symptoms persist after you have completed your treatment plan.  Make sure you Understand these instructions. Will watch your condition. Will get help right away if you are not doing well or get worse.   Thank you for choosing an e-visit.  Your e-visit answers were reviewed by a board certified advanced clinical practitioner to complete your personal care plan. Depending upon the condition, your plan could have included both over the counter or prescription medications.  Please review your pharmacy choice. Make sure the pharmacy is open so you can pick up prescription now. If there is a problem, you may contact your provider through [employer] and have the prescription routed to another pharmacy.  Your safety is important to [place]. If you have drug allergies check your prescription carefully.   For the next 24 hours you can use MyChart to ask questions about today's visit, request a non-urgent call back, or ask for  a work or school excuse. You will get an email in the next two days asking about your experience. I hope that your e-visit has been valuable and will speed your recovery.

## 2022-11-25 NOTE — Progress Notes (Signed)
I have spent 5 minutes in review of e-visit questionnaire, review and updating patient chart, medical decision making and response to patient.  ° °Zelda W Fleming, NP ° °  °

## 2023-01-15 ENCOUNTER — Telehealth: Payer: Medicaid Other | Admitting: Physician Assistant

## 2023-01-15 DIAGNOSIS — B3731 Acute candidiasis of vulva and vagina: Secondary | ICD-10-CM | POA: Diagnosis not present

## 2023-01-15 MED ORDER — FLUCONAZOLE 150 MG PO TABS
150.0000 mg | ORAL_TABLET | Freq: Every day | ORAL | 0 refills | Status: DC
Start: 2023-01-15 — End: 2023-03-21

## 2023-01-15 NOTE — Progress Notes (Signed)
I have spent 5 minutes in review of e-visit questionnaire, review and updating patient chart, medical decision making and response to patient.   Mia Milan Cody Jacklynn Dehaas, PA-C    

## 2023-01-15 NOTE — Progress Notes (Signed)

## 2023-02-08 ENCOUNTER — Emergency Department (HOSPITAL_COMMUNITY): Payer: Medicaid Other

## 2023-02-08 ENCOUNTER — Encounter (HOSPITAL_COMMUNITY): Payer: Self-pay

## 2023-02-08 ENCOUNTER — Emergency Department (HOSPITAL_COMMUNITY)
Admission: EM | Admit: 2023-02-08 | Discharge: 2023-02-08 | Disposition: A | Discharge status: Home / Self Care | Payer: Medicaid Other | Attending: Emergency Medicine | Admitting: Emergency Medicine

## 2023-02-08 ENCOUNTER — Other Ambulatory Visit: Payer: Self-pay

## 2023-02-08 DIAGNOSIS — K5900 Constipation, unspecified: Secondary | ICD-10-CM | POA: Insufficient documentation

## 2023-02-08 DIAGNOSIS — J45909 Unspecified asthma, uncomplicated: Secondary | ICD-10-CM | POA: Diagnosis not present

## 2023-02-08 DIAGNOSIS — I10 Essential (primary) hypertension: Secondary | ICD-10-CM | POA: Insufficient documentation

## 2023-02-08 DIAGNOSIS — Z79899 Other long term (current) drug therapy: Secondary | ICD-10-CM | POA: Diagnosis not present

## 2023-02-08 DIAGNOSIS — R109 Unspecified abdominal pain: Secondary | ICD-10-CM | POA: Diagnosis present

## 2023-02-08 LAB — COMPREHENSIVE METABOLIC PANEL
ALT: 10 U/L (ref 0–44)
AST: 11 U/L — ABNORMAL LOW (ref 15–41)
Albumin: 4 g/dL (ref 3.5–5.0)
Alkaline Phosphatase: 72 U/L (ref 38–126)
Anion gap: 9 (ref 5–15)
BUN: 7 mg/dL (ref 6–20)
CO2: 26 mmol/L (ref 22–32)
Calcium: 9 mg/dL (ref 8.9–10.3)
Chloride: 101 mmol/L (ref 98–111)
Creatinine, Ser: 0.89 mg/dL (ref 0.44–1.00)
GFR, Estimated: >60 mL/min (ref >60)
Glucose, Bld: 94 mg/dL (ref 70–99)
Potassium: 3.2 mmol/L — ABNORMAL LOW (ref 3.5–5.1)
Sodium: 136 mmol/L (ref 135–145)
Total Bilirubin: 0.8 mg/dL (ref 0.3–1.2)
Total Protein: 7.8 g/dL (ref 6.5–8.1)

## 2023-02-08 LAB — CBC WITH DIFFERENTIAL/PLATELET
Abs Immature Granulocytes: 0.05 10*3/uL (ref 0.00–0.07)
Basophils Absolute: 0.1 10*3/uL (ref 0.0–0.1)
Basophils Relative: 1 %
Eosinophils Absolute: 0.3 10*3/uL (ref 0.0–0.5)
Eosinophils Relative: 2 %
HCT: 44.8 % (ref 36.0–46.0)
Hemoglobin: 15 g/dL (ref 12.0–15.0)
Immature Granulocytes: 0 %
Lymphocytes Relative: 21 %
Lymphs Abs: 2.7 10*3/uL (ref 0.7–4.0)
MCH: 30.7 pg (ref 26.0–34.0)
MCHC: 33.5 g/dL (ref 30.0–36.0)
MCV: 91.8 fL (ref 80.0–100.0)
Monocytes Absolute: 0.6 10*3/uL (ref 0.1–1.0)
Monocytes Relative: 4 %
Neutro Abs: 9.4 10*3/uL — ABNORMAL HIGH (ref 1.7–7.7)
Neutrophils Relative %: 72 %
Platelets: 350 10*3/uL (ref 150–400)
RBC: 4.88 MIL/uL (ref 3.87–5.11)
RDW: 13.4 % (ref 11.5–15.5)
WBC: 13.1 10*3/uL — ABNORMAL HIGH (ref 4.0–10.5)
nRBC: 0 % (ref 0.0–0.2)

## 2023-02-08 LAB — LIPASE, BLOOD: Lipase: 22 U/L (ref 11–51)

## 2023-02-08 LAB — POC URINE PREG, ED: Preg Test, Ur: NEGATIVE

## 2023-02-08 MED ORDER — SORBITOL 70 % SOLN
480.0000 mL | TOPICAL_OIL | Freq: Once | ORAL | Status: AC
  Administered 2023-02-08: 480 mL via RECTAL
  Filled 2023-02-08: qty 120

## 2023-02-08 MED ORDER — MAGNESIUM CITRATE PO SOLN
1.0000 | Freq: Once | ORAL | 0 refills | Status: AC
Start: 2023-02-08 — End: 2023-02-08

## 2023-02-08 MED ORDER — SODIUM CHLORIDE 0.9 % IV BOLUS
1000.0000 mL | Freq: Once | INTRAVENOUS | Status: AC
  Administered 2023-02-08: 1000 mL via INTRAVENOUS

## 2023-02-08 MED ORDER — IOHEXOL 300 MG/ML  SOLN
100.0000 mL | Freq: Once | INTRAMUSCULAR | Status: AC | PRN
  Administered 2023-02-08: 100 mL via INTRAVENOUS

## 2023-02-08 MED ORDER — ONDANSETRON HCL 4 MG/2ML IJ SOLN
4.0000 mg | Freq: Once | INTRAMUSCULAR | Status: AC
  Administered 2023-02-08: 4 mg via INTRAVENOUS
  Filled 2023-02-08: qty 2

## 2023-02-08 NOTE — Discharge Instructions (Addendum)
Please follow up with your primary care doctor within 2-3 days. For constipation we also recommend a diet high in fiber (beans, fruits, vegetables, whole grains). Take Colace 100-200 mg up to three times per day. You may take along with Senokot 1-2 tabs, ingest with full glass of water.  You may also take MiraLAX 1-2 capfuls 1-2 times a day until stools become soft and then slowly decrease the amount of MiraLAX used.  Maintain fluid intake 6-8 glasses per day. Please increase fibers in your diet. You may also take Milk of Magnesia 30 mL as needed for constipation, you may repeat in 2 hours again if no bowl movement.  It was a pleasure caring for you today in the emergency department.  Please return to the emergency department for any worsening or worrisome symptoms.  

## 2023-02-08 NOTE — ED Notes (Signed)
Patient transported to CT 

## 2023-02-08 NOTE — ED Provider Notes (Signed)
[place] EMERGENCY DEPARTMENT AT [facility] Provider Note  CSN: [id number] Arrival date & time: 02/08/23 4098  Chief Complaint(s) Constipation  HPI [name] is a 42 y.o. female with past medical history as below, significant for obesity,, anemia, asthma, anxiety, fibroids, hypertension who presents to the ED with complaint of abd pain, nausea constipation  Reports no bowel movement for 2 weeks.  Tried over-the-counter laxatives without relief.  Diffuse abdominal pain over the past few days, nausea without vomiting.  Difficulty tolerate p.o. intake last 24 48 hrs due to pain and nausea.  Abd surge includes bilateral salpingectomy.  No prior bowel obstruction.  No regular use of opiates  Past Medical History Past Medical History:  Diagnosis Date   AKI (acute kidney injury) (HCC) 08/17/2021   Allergy    banana/pineapple   Anemia    post pregnancy   Anxiety    Asthma    Autoimmune disease, not elsewhere classified(279.49) 2013   Non specific- [facility], abnormal Bone Marrow signal   Breast lump in female 11/28/2012   Has tender round mass at 10-11 0'clock right breast 4 finger breaths from [place] will US   Breast mass, right 08/18/2012   Chlamydia    Chronic breast pain 05/31/2015   Dysmenorrhea 03/23/2014   Dyspareunia 01/05/2014   Elevated prolactin level 02/10/2021   Was 54.5, recheck in December ________   Fibroids, intramural 08/24/2015   GERD (gastroesophageal reflux disease)    Gonorrhea    Headache(784.0)    History of abnormal cervical Pap smear 04/02/2014   Hypertension    Kidney stone    06/2021   LLQ abdominal tenderness 05/31/2015   Menorrhagia 01/05/2014   Neuromuscular disorder (HCC)    lower back and bilat leg pain   Obesity    Other and unspecified ovarian cyst 01/12/2014   Polyclonal gammopathy 07/26/2013   Insignificant   Pyelonephritis 06/28/2021   Recurrent upper respiratory infection (URI)    Scoliosis     Urticaria    Vaginal irritation 09/30/2014   Vaginal Pap smear, abnormal    Patient Active Problem List   Diagnosis Date Noted   Mixed hyperlipidemia 05/16/2022   Mass of upper outer quadrant of left breast 11/15/2021   Intractable migraine without aura and without status migrainosus 09/14/2021   Urge incontinence 09/14/2021   Hospital discharge follow-up 07/14/2021   Hydronephrosis 06/28/2021   Fibroadenoma of breast, left 03/31/2021   Restless legs 02/10/2021   Vitamin D deficiency 09/30/2020   Menorrhagia with irregular cycle 08/17/2020   Chronic right shoulder pain 07/01/2020   Mild persistent asthma, uncomplicated 02/12/2018   Abnormal uterine bleeding (AUB) 03/12/2017   Intramural leiomyoma of uterus 08/24/2015   Polyclonal gammopathy 07/26/2013   Dysplasia of cervix 03/31/2013   Primary insomnia 12/09/2011   Hypertension 09/21/2010   Morbid obesity (HCC) 11/03/2007   Home Medication(s) Prior to Admission medications   Medication Sig Start Date End Date Taking? Authorizing Provider  magnesium citrate SOLN Take 296 mLs (1 Bottle total) by mouth once for 1 dose. 02/08/23 02/08/23 Yes Tanda Rockers A, DO  albuterol (PROVENTIL HFA) 108 (90 Base) MCG/ACT inhaler Inhale 2 puffs into the lungs every 6 (six) hours as needed for wheezing (cough). 09/30/20   Anabel Halon, MD  amLODipine (NORVASC) 5 MG tablet Take 1 tablet (5 mg total) by mouth daily. 06/12/22   Anabel Halon, MD  Blood Pressure Monitoring (BLOOD PRESSURE CUFF) MISC Large cuff. ICD10: I10. Check blood pressure once daily.  Please contact office if blood pressure above 150/90. 08/01/21   Allena Katz, Earlie Lou, MD  EPINEPHrine 0.3 mg/0.3 mL IJ SOAJ injection Inject 0.3 mg into the muscle as needed for anaphylaxis. 12/07/20   Nehemiah Settle, FNP  fluconazole (DIFLUCAN) 150 MG tablet Take 1 tablet (150 mg total) by mouth daily. 01/15/23   Waldon Merl, PA-C  fluticasone-salmeterol (ADVAIR HFA) [phone] MCG/ACT inhaler Inhale 2  puffs into the lungs 2 (two) times daily. Patient taking differently: Inhale 2 puffs into the lungs daily. 09/30/20   Anabel Halon, MD  hydrochlorothiazide (MICROZIDE) 12.5 MG capsule Take 1 capsule (12.5 mg total) by mouth daily. 06/12/22   Anabel Halon, MD  phentermine (ADIPEX-P) 37.5 MG tablet Take 0.5 tablets (18.75 mg total) by mouth daily before breakfast. 06/12/22   Anabel Halon, MD  potassium chloride SA (KLOR-CON M) 20 MEQ tablet Take 1 tablet (20 mEq total) by mouth daily. 07/02/21   Vassie Loll, MD  rOPINIRole (REQUIP) 1 MG tablet Take 1 tablet (1 mg total) by mouth at bedtime. 05/15/22   Anabel Halon, MD  SUMAtriptan (IMITREX) 100 MG tablet Take 1 tablet (100 mg total) by mouth every 2 (two) hours as needed for migraine. May repeat in 2 hours if headache persists or recurs. 01/15/22   Anabel Halon, MD  traZODone (DESYREL) 50 MG tablet Take 0.5-1 tablets (25-50 mg total) by mouth at bedtime as needed for sleep. 06/12/22   Anabel Halon, MD  Vitamin D, Ergocalciferol, (DRISDOL) 1.25 MG (50000 UNIT) CAPS capsule Take 1 capsule (50,000 Units total) by mouth every 7 (seven) days. 06/12/22   Anabel Halon, MD  zolpidem (AMBIEN) 10 MG tablet TAKE (1) TABLET BY MOUTH AT BEDTIME. Patient not taking: No sig reported 02/28/16 11/12/18  Salley Scarlet, MD                                                                                                                                    Past Surgical History Past Surgical History:  Procedure Laterality Date   ADENOIDECTOMY     BREAST BIOPSY Left 02/2021   fibroadenoma   CYSTOSCOPY W/ URETERAL STENT PLACEMENT Left 06/29/2021   Procedure: CYSTOSCOPY WITH RETROGRADE PYELOGRAM/URETERAL STENT PLACEMENT;  Surgeon: Malen Gauze, MD;  Location: [facility];  Service: Urology;  Laterality: Left;   CYSTOSCOPY WITH RETROGRADE PYELOGRAM, URETEROSCOPY AND STENT PLACEMENT Left 07/27/2021   Procedure: CYSTOSCOPY WITH RETROGRADE PYELOGRAM,  URETEROSCOPY, STONE EXTRACTION WITH BASKET;  Surgeon: Malen Gauze, MD;  Location: [facility];  Service: Urology;  Laterality: Left;   DILATATION AND CURETTAGE/HYSTEROSCOPY WITH MINERVA N/A 03/14/2021   Procedure: HYSTEROSCOPY WITH MINERVA;  Surgeon: Lazaro Arms, MD;  Location: [facility];  Service: Gynecology;  Laterality: N/A;   DILATION AND CURETTAGE OF UTERUS     LAPAROSCOPIC BILATERAL SALPINGECTOMY Bilateral 03/16/2020   Procedure: LAPAROSCOPIC BILATERAL SALPINGECTOMY;  Surgeon: Lazaro Arms, MD;  Location: [facility];  Service: Gynecology;  Laterality: Bilateral;   SHOULDER SURGERY Right 03/10/2019   TONSILLECTOMY     TUBAL LIGATION N/A    Phreesia 06/28/2020   Family History Family History  Problem Relation Age of Onset   Hypertension Mother    Diabetes Mother    Heart disease Mother    Diabetes Father    Cancer Paternal Aunt        breast   Hypertension Maternal Grandmother    Diabetes Son        pre diabetic   Other Son        overactive bladder   COPD Maternal Aunt    Leukemia Maternal Aunt    Allergic rhinitis Neg Hx    Angioedema Neg Hx    Asthma Neg Hx    Atopy Neg Hx    Eczema Neg Hx    Immunodeficiency Neg Hx    Urticaria Neg Hx     Social History Social History   Tobacco Use   Smoking status: Never   Smokeless tobacco: Never  Vaping Use   Vaping status: Never Used  Substance Use Topics   Alcohol use: No   Drug use: No   Allergies Banana and Pineapple  Review of Systems Review of Systems  Constitutional:  Negative for chills and fever.  Respiratory:  Negative for chest tightness.   Cardiovascular:  Negative for chest pain.  Gastrointestinal:  Positive for abdominal pain, constipation and nausea. Negative for diarrhea and vomiting.  All other systems reviewed and are negative.   Physical Exam Vital Signs  I have reviewed the triage vital signs BP 123/77   Pulse 84   Temp 98.4 F (36.9 C) (Oral)   Resp 18   Ht 5\' 5"  (1.651 m)   Wt  117.9 kg   SpO2 99%   BMI 43.27 kg/m  Physical Exam Vitals and nursing note reviewed.  Constitutional:      General: She is not in acute distress.    Appearance: Normal appearance. She is well-developed. She is not ill-appearing.  HENT:     Head: Normocephalic and atraumatic.     Right Ear: External ear normal.     Left Ear: External ear normal.     Nose: Nose normal.     Mouth/Throat:     Mouth: Mucous membranes are moist.  Eyes:     General: No scleral icterus.       Right eye: No discharge.        Left eye: No discharge.  Cardiovascular:     Rate and Rhythm: Normal rate.  Pulmonary:     Effort: Pulmonary effort is normal. No respiratory distress.     Breath sounds: No stridor.  Abdominal:     General: Abdomen is flat. There is distension.     Tenderness: There is abdominal tenderness in the right lower quadrant, suprapubic area and left lower quadrant. There is no guarding.     Comments: Not peritoneal Unable to auscultate bowel sounds   Musculoskeletal:        General: No deformity.     Cervical back: No rigidity.  Skin:    General: Skin is warm and dry.     Coloration: Skin is not cyanotic, jaundiced or pale.  Neurological:     Mental Status: She is alert and oriented to person, place, and time.     GCS: GCS eye subscore is 4. GCS verbal subscore is 5. GCS motor subscore is 6.  Psychiatric:        Speech: Speech normal.        Behavior: Behavior normal. Behavior is cooperative.     ED Results and Treatments Labs (all labs ordered are listed, but only abnormal results are displayed) Labs Reviewed  CBC WITH DIFFERENTIAL/PLATELET - Abnormal; Notable for the following components:      Result Value   WBC 13.1 (*)    Neutro Abs 9.4 (*)    All other components within normal limits  COMPREHENSIVE METABOLIC PANEL - Abnormal; Notable for the following components:   Potassium 3.2 (*)    AST 11 (*)    All other components within normal limits  LIPASE, BLOOD  POC  URINE PREG, ED                                                                                                                          Radiology CT ABDOMEN PELVIS W CONTRAST  Result Date: 02/08/2023 CLINICAL DATA:  Acute abdominal pain.  Constipation EXAM: CT ABDOMEN AND PELVIS WITH CONTRAST TECHNIQUE: Multidetector CT imaging of the abdomen and pelvis was performed using the standard protocol following bolus administration of intravenous contrast. RADIATION DOSE REDUCTION: This exam was performed according to the departmental dose-optimization program which includes automated exposure control, adjustment of the mA and/or kV according to patient size and/or use of iterative reconstruction technique. CONTRAST:  OMNIPAQUE IOHEXOL 300 MG/ML  SOLN COMPARISON:  Renal stone CT 02/12/2022 FINDINGS: Lower chest: Stable left breast nodule. Please correlate with the prior ultrasound of 06/14/2022 of the breast. Slight linear opacity at the bases likely scar or atelectasis. No pleural effusion. Hepatobiliary: Focal fat deposition seen in the liver adjacent to the falciform ligament. Gallbladder is present. Patent portal vein. Pancreas: Unremarkable. No pancreatic ductal dilatation or surrounding inflammatory changes. Spleen: Normal in size without focal abnormality. Adrenals/Urinary Tract: The adrenal glands are preserved. Punctate nonobstructing upper pole left-sided renal stone is stable. No enhancing renal mass or collecting system dilatation. Preserved contours of the urinary bladder. Stomach/Bowel: Large bowel has a normal course and caliber with scattered stool. Normal appendix. Mild debris in the nondilated stomach. Small bowel is nondilated. There are a few fluid-filled loops of small bowel distally, nonspecific. Diameter of these loops is less than 3 cm. Vascular/Lymphatic: No significant vascular findings are present. No enlarged abdominal or pelvic lymph nodes. Reproductive: Uterus is present.  Prominent periuterine vessels. Please correlate for any particular symptoms. Is also a cystic structure in the right ovary measuring 3 cm. Other: No free air. Small amount of free fluid dependently in the pelvis. This could be physiologic. Musculoskeletal: No acute or significant osseous findings. IMPRESSION: No bowel obstruction, free air. Normal appendix. Few fluid-filled loops of distal ileum. Small amount of free fluid in the pelvis, possibly physiologic. Prominent gonadal vessels and periuterine vessels. Please correlate for any symptoms such as for pelvic congestion syndrome. 3 cm right-sided ovarian cystic lesion. No follow-up imaging recommended in the absence  of symptoms otherwise follow up pelvic ultrasound as clinically appropriate to assess for blood flow to the ovary. Note: This recommendation does not apply to premenarchal patients and to those with increased risk (genetic, family history, elevated tumor markers or other high-risk factors) of ovarian cancer. Reference: JACR 2020 Feb; 17(2):248-254 Punctate nonobstructing upper pole left-sided renal stone Electronically Signed   By: Karen Kays M.D.   On: 02/08/2023 12:07    Pertinent labs & imaging results that were available during my care of the patient were reviewed by me and considered in my medical decision making (see MDM for details).  Medications Ordered in ED Medications  sodium chloride 0.9 % bolus 1,000 mL (0 mLs Intravenous Stopped 02/08/23 1039)  iohexol (OMNIPAQUE) 300 MG/ML solution 100 mL (100 mLs Intravenous Contrast Given 02/08/23 1040)  ondansetron (ZOFRAN) injection 4 mg (4 mg Intravenous Given 02/08/23 1127)  sorbitol, milk of mag, mineral oil, glycerin (SMOG) enema (480 mLs Rectal Given 02/08/23 1302)                                                                                                                                     Procedures Procedures  (including critical care time)  Medical Decision Making / ED  Course    Medical Decision Making:    [name] is a 42 y.o. female with past medical history as below, significant for obesity,, anemia, asthma, anxiety, fibroids, hypertension who presents to the ED with complaint of abd pain, nausea constipation. The complaint involves an extensive differential diagnosis and also carries with it a high risk of complications and morbidity.  Serious etiology was considered. Ddx includes but is not limited to: Differential diagnosis includes but is not exclusive to ectopic pregnancy, ovarian cyst, ovarian torsion, acute appendicitis, urinary tract infection, endometriosis, bowel obstruction, hernia, colitis, renal colic, gastroenteritis, volvulus etc.   Complete initial physical exam performed, notably the patient  was NAD, abd not peritoneal .    Reviewed and confirmed nursing documentation for past medical history, family history, social history.  Vital signs reviewed.    Clinical Course as of 02/08/23 2040  Fri Feb 08, 2023  1217 CTAP looks okay, no SBO, will trial enema  [SG]    Clinical Course User Index [SG] Sloan Leiter, DO     Labs stable CT stable Given enema w/o bowel movement Discussed supportive care at home, laxative use, dietary changes. Warnings discussed.   She has chronic constipation, worse today. No sbo or fecal impaction. Can treat at home.   The patient improved significantly and was discharged in stable condition. Detailed discussions were had with the patient regarding current findings, and need for close f/u with PCP or on call doctor. The patient has been instructed to return immediately if the symptoms worsen in any way for re-evaluation. Patient verbalized understanding and is in agreement with current care plan. All questions answered prior to discharge.  Additional history obtained: -Additional history obtained from family -External records from outside source obtained and  reviewed including: Chart review including previous notes, labs, imaging, consultation notes including  Home meds    Lab Tests: -I ordered, reviewed, and interpreted labs.   The pertinent results include:   Labs Reviewed  CBC WITH DIFFERENTIAL/PLATELET - Abnormal; Notable for the following components:      Result Value   WBC 13.1 (*)    Neutro Abs 9.4 (*)    All other components within normal limits  COMPREHENSIVE METABOLIC PANEL - Abnormal; Notable for the following components:   Potassium 3.2 (*)    AST 11 (*)    All other components within normal limits  LIPASE, BLOOD  POC URINE PREG, ED    Notable for stable  EKG   EKG Interpretation Date/Time:    Ventricular Rate:    PR Interval:    QRS Duration:    QT Interval:    QTC Calculation:   R Axis:      Text Interpretation:           Imaging Studies ordered: I ordered imaging studies including ctap I independently visualized the following imaging with scope of interpretation limited to determining acute life threatening conditions related to emergency care; findings noted above I independently visualized and interpreted imaging. I agree with the radiologist interpretation   Medicines ordered and prescription drug management: Meds ordered this encounter  Medications   sodium chloride 0.9 % bolus 1,000 mL   iohexol (OMNIPAQUE) 300 MG/ML solution 100 mL   ondansetron (ZOFRAN) injection 4 mg   sorbitol, milk of mag, mineral oil, glycerin (SMOG) enema   magnesium citrate SOLN    Sig: Take 296 mLs (1 Bottle total) by mouth once for 1 dose.    Dispense:  296 mL    Refill:  0    -I have reviewed the patients home medicines and have made adjustments as needed   Consultations Obtained: na   Cardiac Monitoring: Continuous pulse oximetry interpreted by myself, 99% on ra.    Social Determinants of Health:  Diagnosis or treatment significantly limited by social determinants of health:  obesity   Reevaluation: After the interventions noted above, I reevaluated the patient and found that they have stayed the same  Co morbidities that complicate the patient evaluation  Past Medical History:  Diagnosis Date   AKI (acute kidney injury) (HCC) 08/17/2021   Allergy    banana/pineapple   Anemia    post pregnancy   Anxiety    Asthma    Autoimmune disease, not elsewhere classified(279.49) 2013   Non specific- [facility], abnormal Bone Marrow signal   Breast lump in female 11/28/2012   Has tender round mass at 10-11 0'clock right breast 4 finger breaths from [place] will US   Breast mass, right 08/18/2012   Chlamydia    Chronic breast pain 05/31/2015   Dysmenorrhea 03/23/2014   Dyspareunia 01/05/2014   Elevated prolactin level 02/10/2021   Was 54.5, recheck in December ________   Fibroids, intramural 08/24/2015   GERD (gastroesophageal reflux disease)    Gonorrhea    Headache(784.0)    History of abnormal cervical Pap smear 04/02/2014   Hypertension    Kidney stone    06/2021   LLQ abdominal tenderness 05/31/2015   Menorrhagia 01/05/2014   Neuromuscular disorder (HCC)    lower back and bilat leg pain   Obesity    Other and unspecified ovarian cyst 01/12/2014   Polyclonal gammopathy  07/26/2013   Insignificant   Pyelonephritis 06/28/2021   Recurrent upper respiratory infection (URI)    Scoliosis    Urticaria    Vaginal irritation 09/30/2014   Vaginal Pap smear, abnormal       Dispostion: Disposition decision including need for hospitalization was considered, and patient discharged from emergency department.    Final Clinical Impression(s) / ED Diagnoses Final diagnoses:  Constipation, unspecified constipation type        Sloan Leiter, DO 02/08/23 2040

## 2023-02-08 NOTE — ED Notes (Signed)
Pt unsuccessful with SMOG enema. MD made aware

## 2023-02-08 NOTE — ED Triage Notes (Signed)
Pt reported not pooping for 2 weeks. Took laxatives yesterday afternoon which was reported not to help. Abdominal pain rated 9/10 with nausea. Denies diarrhea and vomiting.

## 2023-02-08 NOTE — ED Notes (Signed)
Pt returned from CT °

## 2023-02-11 ENCOUNTER — Encounter: Payer: Self-pay | Admitting: Gastroenterology

## 2023-02-11 ENCOUNTER — Ambulatory Visit (INDEPENDENT_AMBULATORY_CARE_PROVIDER_SITE_OTHER): Payer: Medicaid Other | Admitting: Gastroenterology

## 2023-02-11 VITALS — BP 135/86 | HR 81 | Temp 98.7°F | Ht 65.0 in | Wt 281.2 lb

## 2023-02-11 DIAGNOSIS — R110 Nausea: Secondary | ICD-10-CM | POA: Diagnosis not present

## 2023-02-11 DIAGNOSIS — K5900 Constipation, unspecified: Secondary | ICD-10-CM

## 2023-02-11 DIAGNOSIS — R109 Unspecified abdominal pain: Secondary | ICD-10-CM | POA: Diagnosis not present

## 2023-02-11 DIAGNOSIS — K5909 Other constipation: Secondary | ICD-10-CM | POA: Diagnosis not present

## 2023-02-11 NOTE — Progress Notes (Signed)
Referring Provider: [name] Primary Care Physician:  [facility], [facility] Primary Gastroenterologist:  Dr. Jena Gauss  Chief Complaint  Patient presents with   Constipation    HPI:   [name] is a 42 y.o. female presenting today at the request of [name] for constipation.   Patient was seen in the emergency room 02/08/2023 with chief complaint of abdominal pain, nausea, constipation.  Reported no bowel movement in 2 weeks.  Failed over-the-counter laxatives.  Laboratory evaluation showed mildly elevated white count at 13.1, otherwise no significant abnormalities.  CT A/P with contrast with no bowel obstruction.  Few fluid-filled loops of distal ileum. She was given SMOG enema with improvement. She was discharged with 1 bottle of magnesium citrate.  Today:  Reports chronic history of constipation dating back at least 20 years.  Since starting [facility] in August, she has had worsening constipation.  She is not had a good bowel movement in 2 weeks.  She has had associated abdominal pain and nausea without vomiting.  She went to the ER due to worsening symptoms last week.  States they gave her an enema, but she did not have a bowel movement with this.  She then drink mag citrate as they recommended and still no bowel movement.  Yesterday, she had someone help her, "pick the stool out" and she passed 2 small hard stool balls.  Today, she feels that her abdomen is not quite as hard and not as tender.  She is passing gas.  Denies BRBPR, melena. No chronic issues with nausea prior to worsening constipation.  Reports she has tried MiraLAX in the past, but this has never worked for her.  Laxatives cause "griping" of her stomach.  Fhx colon cancer: None.   No prior colonoscopy.  Past Medical History:  Diagnosis Date   AKI (acute kidney injury) (HCC) 08/17/2021   Allergy    banana/pineapple   Anemia    post pregnancy   Anxiety    Asthma    Autoimmune disease, not  elsewhere classified(279.49) 2013   Non specific- [facility], abnormal Bone Marrow signal   Breast lump in female 11/28/2012   Has tender round mass at 10-11 0'clock right breast 4 finger breaths from [facility] will US   Breast mass, right 08/18/2012   Chlamydia    Chronic breast pain 05/31/2015   Dysmenorrhea 03/23/2014   Dyspareunia 01/05/2014   Elevated prolactin level 02/10/2021   Was 54.5, recheck in December ________   Fibroids, intramural 08/24/2015   GERD (gastroesophageal reflux disease)    Gonorrhea    Headache(784.0)    History of abnormal cervical Pap smear 04/02/2014   Hypertension    Kidney stone    06/2021   LLQ abdominal tenderness 05/31/2015   Menorrhagia 01/05/2014   Neuromuscular disorder (HCC)    lower back and bilat leg pain   Obesity    Other and unspecified ovarian cyst 01/12/2014   Polyclonal gammopathy 07/26/2013   Insignificant   Pyelonephritis 06/28/2021   Recurrent upper respiratory infection (URI)    Scoliosis    Urticaria    Vaginal irritation 09/30/2014   Vaginal Pap smear, abnormal     Past Surgical History:  Procedure Laterality Date   ADENOIDECTOMY     BREAST BIOPSY Left 02/2021   fibroadenoma   CYSTOSCOPY W/ URETERAL STENT PLACEMENT Left 06/29/2021   Procedure: CYSTOSCOPY WITH RETROGRADE PYELOGRAM/URETERAL STENT PLACEMENT;  Surgeon: Malen Gauze, MD;  Location: [facility];  Service: Urology;  Laterality:  Left;   CYSTOSCOPY WITH RETROGRADE PYELOGRAM, URETEROSCOPY AND STENT PLACEMENT Left 07/27/2021   Procedure: CYSTOSCOPY WITH RETROGRADE PYELOGRAM, URETEROSCOPY, STONE EXTRACTION WITH BASKET;  Surgeon: Malen Gauze, MD;  Location: [facility];  Service: Urology;  Laterality: Left;   DILATATION AND CURETTAGE/HYSTEROSCOPY WITH MINERVA N/A 03/14/2021   Procedure: HYSTEROSCOPY WITH MINERVA;  Surgeon: Lazaro Arms, MD;  Location: [facility];  Service: Gynecology;  Laterality: N/A;   DILATION AND CURETTAGE OF UTERUS     LAPAROSCOPIC  BILATERAL SALPINGECTOMY Bilateral 03/16/2020   Procedure: LAPAROSCOPIC BILATERAL SALPINGECTOMY;  Surgeon: Lazaro Arms, MD;  Location: [facility];  Service: Gynecology;  Laterality: Bilateral;   SHOULDER SURGERY Right 03/10/2019   TONSILLECTOMY     TUBAL LIGATION N/A    Phreesia 06/28/2020    Current Outpatient Medications  Medication Sig Dispense Refill   albuterol (PROVENTIL HFA) 108 (90 Base) MCG/ACT inhaler Inhale 2 puffs into the lungs every 6 (six) hours as needed for wheezing (cough). 8 g 5   amLODipine (NORVASC) 5 MG tablet Take 1 tablet (5 mg total) by mouth daily. 90 tablet 1   Blood Pressure Monitoring (BLOOD PRESSURE CUFF) MISC Large cuff. ICD10: I10. Check blood pressure once daily. Please contact office if blood pressure above 150/90. 1 each 0   fluconazole (DIFLUCAN) 150 MG tablet Take 1 tablet (150 mg total) by mouth daily. 1 tablet 0   fluticasone-salmeterol (ADVAIR HFA) 115-21 MCG/ACT inhaler Inhale 2 puffs into the lungs 2 (two) times daily. (Patient taking differently: Inhale 2 puffs into the lungs daily.) 1 each 12   hydrochlorothiazide (MICROZIDE) 12.5 MG capsule Take 1 capsule (12.5 mg total) by mouth daily. 90 capsule 1   rOPINIRole (REQUIP) 1 MG tablet Take 1 tablet (1 mg total) by mouth at bedtime. 30 tablet 3   SUMAtriptan (IMITREX) 100 MG tablet Take 1 tablet (100 mg total) by mouth every 2 (two) hours as needed for migraine. May repeat in 2 hours if headache persists or recurs. 10 tablet 2   WEGOVY 0.5 MG/0.5ML SOAJ Inject 0.5 mg into the skin once a week.     No current facility-administered medications for this visit.    Allergies as of 02/11/2023 - Review Complete 02/11/2023  Allergen Reaction Noted   Banana Rash 12/07/2011   Pineapple Rash 12/07/2011    Family History  Problem Relation Age of Onset   Hypertension Mother    Diabetes Mother    Heart disease Mother    Diabetes Father    Hypertension Maternal Grandmother    Diabetes Son        pre  diabetic   Other Son        overactive bladder   COPD Maternal Aunt    Leukemia Maternal Aunt    Cancer Paternal Aunt        breast   Allergic rhinitis Neg Hx    Angioedema Neg Hx    Asthma Neg Hx    Atopy Neg Hx    Eczema Neg Hx    Immunodeficiency Neg Hx    Urticaria Neg Hx    Colon cancer Neg Hx     Social History   Socioeconomic History   Marital status: Divorced    Spouse name: Sales promotion account executive   Number of children: 2   Years of education: Not on file   Highest education level: Not on file  Occupational History   Not on file  Tobacco Use   Smoking status: Never   Smokeless tobacco: Never  Vaping  Use   Vaping status: Never Used  Substance and Sexual Activity   Alcohol use: No   Drug use: No   Sexual activity: Yes    Birth control/protection: Surgical    Comment: tubal & ablation  Other Topics Concern   Not on file  Social History Narrative   Not on file   Social Determinants of Health   Financial Resource Strain: Medium Risk (02/12/2022)   Overall Financial Resource Strain (CARDIA)    Difficulty of Paying Living Expenses: Somewhat hard  Food Insecurity: No Food Insecurity (02/12/2022)   Hunger Vital Sign    Worried About Running Out of Food in the Last Year: Never true    Ran Out of Food in the Last Year: Never true  Transportation Needs: No Transportation Needs (04/20/2022)   PRAPARE - Administrator, Civil Service (Medical): No    Lack of Transportation (Non-Medical): No  Physical Activity: Insufficiently Active (02/23/2020)   Exercise Vital Sign    Days of Exercise per Week: 2 days    Minutes of Exercise per Session: 10 min  Stress: Stress Concern Present (09/02/2021)   [employer] of Occupational Health - Occupational Stress Questionnaire    Feeling of Stress : Rather much  Social Connections: Unknown (09/01/2021)   Social Connection and Isolation Panel [NHANES]    Frequency of Communication with Friends and Family: More than three times a  week    Frequency of Social Gatherings with Friends and Family: Three times a week    Attends Religious Services: 1 to 4 times per year    Active Member of Clubs or Organizations: No    Attends Banker Meetings: Never    Marital Status: Not on file  Intimate Partner Violence: Not At Risk (09/01/2021)   Humiliation, Afraid, Rape, and Kick questionnaire    Fear of Current or Ex-Partner: No    Emotionally Abused: No    Physically Abused: No    Sexually Abused: No    Review of Systems: Gen: Denies any fever, chills, cold or flulike symptoms, presyncope, syncope. CV: Denies chest pain, heart palpitations. Resp: Denies shortness of breath, cough.  GI: See HPI GU : Denies urinary burning, urinary frequency, urinary hesitancy MS: Denies joint pain.  Derm: Denies rash. Psych: Denies depression, anxiety. Heme: See HPI  Physical Exam: BP 135/86 (BP Location: Left Arm, Patient Position: Sitting, Cuff Size: Large)   Pulse 81   Temp 98.7 F (37.1 C) (Oral)   Ht 5\' 5"  (1.651 m)   Wt 281 lb 3.2 oz (127.6 kg)   LMP  (LMP Unknown)   SpO2 100%   BMI 46.79 kg/m  General:   Alert and oriented. Pleasant and cooperative. Well-nourished and well-developed.  Head:  Normocephalic and atraumatic. Eyes:  Without icterus, sclera clear and conjunctiva pink.  Ears:  Normal auditory acuity. Abdomen:  +BS, soft, and non-distended.  Mild generalized tenderness to palpation. No HSM noted. No guarding or rebound. No masses appreciated.  Msk:  Symmetrical without gross deformities. Normal posture. Neurologic:  Alert and  oriented x4;  grossly normal neurologically. Psych: Normal mood and affect.    Assessment:  42 year old female presenting today for further evaluation of worsening chronic constipation since starting [facility] in August.  Associated abdominal pain and nausea with no BM in the last 2 weeks.  Evaluated in the emergency room 02/08/2023. No significant laboratory abnormalities.  CT  with no evidence of bowel obstruction.  SMOG enema given, but patient reports no  bowel movement.  Also drank mag citrate with no bowel movement.  Had someone help manually remove 2 small balls of stool from her rectum yesterday. No brbpr, melena, or Fhx of colon cancer.   I suspect worsening baseline constipation is secondary to [facility].  We discussed that ultimately, she may need to discontinue Wegovy, but she prefers to continue if possible for weight loss.  I will try her on Linzess 290 mcg daily and also recommended Dulcolax suppositories daily for the next few days until bowels start moving well.   Plan:  Start Linzess 290 mcg daily 30 minutes before breakfast.  Samples provided. Start Dulcolax 10 mg suppository daily for the next few days until bowels are moving well. Requested progress report in 1 week. Follow-up in the office in 3 months or sooner if needed.   Ermalinda Memos, PA-C [facility] 02/11/2023

## 2023-02-11 NOTE — Patient Instructions (Signed)
As we discussed, your constipation may be secondary to [facility].  For now, we can try starting Linzess 290 mcg daily.  Take this medication first thing in the morning, 30 minutes before breakfast.  Also recommend that you use Dulcolax 10 mg suppository daily for the next few days until your bowels are moving well.  Please call or send a MyChart message in 1 week to let me know how the Linzess is working for you.  If it works well, I will send a prescription to your pharmacy .If it isn't working well, we will make adjustments.   We will plan for routine follow-up in 3 months or sooner if needed.    Ermalinda Memos, PA-C [facility]enter[facility]

## 2023-02-13 ENCOUNTER — Telehealth: Payer: Medicaid Other | Admitting: Nurse Practitioner

## 2023-02-13 DIAGNOSIS — B3731 Acute candidiasis of vulva and vagina: Secondary | ICD-10-CM

## 2023-02-13 MED ORDER — FLUCONAZOLE 150 MG PO TABS
150.0000 mg | ORAL_TABLET | Freq: Once | ORAL | 0 refills | Status: AC
Start: 2023-02-13 — End: 2023-02-13

## 2023-02-13 NOTE — Progress Notes (Signed)

## 2023-02-21 ENCOUNTER — Telehealth: Payer: Self-pay | Admitting: *Deleted

## 2023-02-21 NOTE — Telephone Encounter (Signed)
Pt called and states that Linzess are working well for her. She would like a prescription sent to [facility].

## 2023-03-21 ENCOUNTER — Telehealth: Payer: Medicaid Other | Admitting: Physician Assistant

## 2023-03-21 DIAGNOSIS — B379 Candidiasis, unspecified: Secondary | ICD-10-CM | POA: Diagnosis not present

## 2023-03-21 MED ORDER — FLUCONAZOLE 150 MG PO TABS
ORAL_TABLET | ORAL | 0 refills | Status: DC
Start: 2023-03-21 — End: 2023-04-30

## 2023-03-21 NOTE — Progress Notes (Signed)

## 2023-04-30 ENCOUNTER — Telehealth: Payer: Medicaid Other | Admitting: Physician Assistant

## 2023-04-30 DIAGNOSIS — B3731 Acute candidiasis of vulva and vagina: Secondary | ICD-10-CM | POA: Diagnosis not present

## 2023-04-30 MED ORDER — FLUCONAZOLE 150 MG PO TABS
150.0000 mg | ORAL_TABLET | ORAL | 0 refills | Status: DC | PRN
Start: 2023-04-30 — End: 2023-06-09

## 2023-04-30 NOTE — Progress Notes (Signed)

## 2023-05-04 NOTE — Progress Notes (Signed)
 Referring Provider: [facility] Primary Care Physician:  [facility], [facility] Primary GI Physician: Dr. Shaaron  Chief Complaint  Patient presents with   Follow-up    Still having problems with constipation     HPI:   [name] is a 43 y.o. female with history of chronic constipation, presenting today for follow-up.  Last seen in the office at the time of initial consult reporting worsening chronic constipation since starting [facility] in August.  Associated abdominal pain and nausea when going without a bowel movement for 2 weeks.  Recent evaluation in the ER 02/08/2023 with no significant abnormalities on labs or CT.  No BRBPR, melena, family history of colon cancer.  Suspected worsening constipation secondary to [facility].  Patient preferred to continue this medication if possible for weight loss.  Recommended starting Linzess  290 mcg daily, Dulcolax suppositories daily for a couple of days.  Requested 1 week progress report and plan for 60-month follow-up.  Patient called 10/24 reporting Linzess  was working well, but I did not receive this message, so a prescription was never sent.  Today: When taking Linzess , she was having bowel movements daily. Currently not taking anything and is having BMs every 2-3 days. Last couple of weeks, she has noticed some red blood in the stool. Blood is in the stool and on tissue. Total of 4 episodes of rectal bleeding. No hemorrhoids or rectal pain. No abdominal pain. Has some nausea.   Has heartburn a couple times a week. Can be really bad.  Nausea is 4/7 days a week. No particular time of the day. Not related to meals. Nausea predates Wegovy. Able to eat. Some days she doesn't want to eat. This has been more of an issue since [facility] though she did have some symptoms prior.   No NSAIDs.   No prior colonoscopy.   Past Medical History:  Diagnosis Date   AKI (acute kidney injury) (HCC) 08/17/2021   Allergy     banana/pineapple    Anemia    post pregnancy   Anxiety    Asthma    Autoimmune disease, not elsewhere classified(279.49) 2013   Non specific- [facility], abnormal Bone Marrow signal   Breast lump in female 11/28/2012   Has tender round mass at 10-11 0'clock right breast 4 finger breaths from [facility] will US    Breast mass, right 08/18/2012   Chlamydia    Chronic breast pain 05/31/2015   Dysmenorrhea 03/23/2014   Dyspareunia 01/05/2014   Elevated prolactin level 02/10/2021   Was 54.5, recheck in December ________   Fibroids, intramural 08/24/2015   GERD (gastroesophageal reflux disease)    Gonorrhea    Headache(784.0)    History of abnormal cervical Pap smear 04/02/2014   Hypertension    Kidney stone    06/2021   LLQ abdominal tenderness 05/31/2015   Menorrhagia 01/05/2014   Neuromuscular disorder (HCC)    lower back and bilat leg pain   Obesity    Other and unspecified ovarian cyst 01/12/2014   Polyclonal gammopathy 07/26/2013   Insignificant   Pyelonephritis 06/28/2021   Recurrent upper respiratory infection (URI)    Scoliosis    Urticaria    Vaginal irritation 09/30/2014   Vaginal Pap smear, abnormal     Past Surgical History:  Procedure Laterality Date   ADENOIDECTOMY     BREAST BIOPSY Left 02/2021   fibroadenoma   CYSTOSCOPY W/ URETERAL STENT PLACEMENT Left 06/29/2021   Procedure: CYSTOSCOPY WITH RETROGRADE PYELOGRAM/URETERAL STENT PLACEMENT;  Surgeon: Sherrilee,  Belvie CROME, MD;  Location: [facility];  Service: Urology;  Laterality: Left;   CYSTOSCOPY WITH RETROGRADE PYELOGRAM, URETEROSCOPY AND STENT PLACEMENT Left 07/27/2021   Procedure: CYSTOSCOPY WITH RETROGRADE PYELOGRAM, URETEROSCOPY, STONE EXTRACTION WITH BASKET;  Surgeon: Sherrilee Belvie CROME, MD;  Location: [facility];  Service: Urology;  Laterality: Left;   DILATATION AND CURETTAGE/HYSTEROSCOPY WITH MINERVA N/A 03/14/2021   Procedure: HYSTEROSCOPY WITH MINERVA;  Surgeon: Jayne Vonn DEL, MD;  Location: [facility];  Service: Gynecology;   Laterality: N/A;   DILATION AND CURETTAGE OF UTERUS     LAPAROSCOPIC BILATERAL SALPINGECTOMY Bilateral 03/16/2020   Procedure: LAPAROSCOPIC BILATERAL SALPINGECTOMY;  Surgeon: Jayne Vonn DEL, MD;  Location: [facility];  Service: Gynecology;  Laterality: Bilateral;   SHOULDER SURGERY Right 03/10/2019   TONSILLECTOMY     TUBAL LIGATION N/A    [employer]reesia 06/28/2020    Current Outpatient Medications  Medication Sig Dispense Refill   albuterol  (PROVENTIL  HFA) 108 (90 Base) MCG/ACT inhaler Inhale 2 puffs into the lungs every 6 (six) hours as needed for wheezing (cough). 8 g 5   amLODipine  (NORVASC ) 5 MG tablet Take 1 tablet (5 mg total) by mouth daily. 90 tablet 1   Blood Pressure Monitoring (BLOOD PRESSURE CUFF) MISC Large cuff. ICD10: I10. Check blood pressure once daily. Please contact office if blood pressure above 150/90. 1 each 0   fluticasone -salmeterol (ADVAIR  HFA) 115-21 MCG/ACT inhaler Inhale 2 puffs into the lungs 2 (two) times daily. (Patient taking differently: Inhale 2 puffs into the lungs daily.) 1 each 12   hydrochlorothiazide  (MICROZIDE ) 12.5 MG capsule Take 1 capsule (12.5 mg total) by mouth daily. 90 capsule 1   linaclotide  (LINZESS ) 290 MCG CAPS capsule Take 1 capsule (290 mcg total) by mouth daily before breakfast. 30 capsule 5   ondansetron  (ZOFRAN ) 4 MG tablet Take 1 tablet (4 mg total) by mouth every 8 (eight) hours as needed for nausea or vomiting. 30 tablet 0   pantoprazole  (PROTONIX ) 40 MG tablet Take 1 tablet (40 mg total) by mouth daily before breakfast. 30 tablet 3   WEGOVY 0.5 MG/0.5ML SOAJ Inject 0.5 mg into the skin once a week.     fluconazole  (DIFLUCAN ) 150 MG tablet Take 1 tablet (150 mg total) by mouth every 3 (three) days as needed. (Patient not taking: Reported on 05/08/2023) 2 tablet 0   rOPINIRole  (REQUIP ) 1 MG tablet Take 1 tablet (1 mg total) by mouth at bedtime. (Patient not taking: Reported on 05/08/2023) 30 tablet 3   No current facility-administered medications  for this visit.    Allergies as of 05/08/2023 - Review Complete 05/08/2023  Allergen Reaction Noted   Banana Rash 12/07/2011   Pineapple Rash 12/07/2011    Family History  Problem Relation Age of Onset   Hypertension Mother    Diabetes Mother    Heart disease Mother    Diabetes Father    Hypertension Maternal Grandmother    Diabetes Son        pre diabetic   Other Son        overactive bladder   COPD Maternal Aunt    Leukemia Maternal Aunt    Cancer Paternal Aunt        breast   Allergic rhinitis Neg Hx    Angioedema Neg Hx    Asthma Neg Hx    Atopy Neg Hx    Eczema Neg Hx    Immunodeficiency Neg Hx    Urticaria Neg Hx    Colon cancer Neg Hx  Social History   Socioeconomic History   Marital status: Divorced    Spouse name: Jameel   Number of children: 2   Years of education: Not on file   Highest education level: Not on file  Occupational History   Not on file  Tobacco Use   Smoking status: Never   Smokeless tobacco: Never  Vaping Use   Vaping status: Never Used  Substance and Sexual Activity   Alcohol use: No   Drug use: No   Sexual activity: Yes    Birth control/protection: Surgical    Comment: tubal & ablation  Other Topics Concern   Not on file  Social History Narrative   Not on file   Social Drivers of Health   Financial Resource Strain: Medium Risk (02/12/2022)   Overall Financial Resource Strain (CARDIA)    Difficulty of Paying Living Expenses: Somewhat hard  Food Insecurity: No Food Insecurity (02/12/2022)   Hunger Vital Sign    Worried About Running Out of Food in the Last Year: Never true    Ran Out of Food in the Last Year: Never true  Transportation Needs: No Transportation Needs (04/20/2022)   PRAPARE - Administrator, Civil Service (Medical): No    Lack of Transportation (Non-Medical): No  Physical Activity: Insufficiently Active (02/23/2020)   Exercise Vital Sign    Days of Exercise per Week: 2 days    Minutes of  Exercise per Session: 10 min  Stress: Stress Concern Present (09/02/2021)   [employer] of Occupational Health - Occupational Stress Questionnaire    Feeling of Stress : Rather much  Social Connections: Unknown (09/01/2021)   Social Connection and Isolation Panel [NHANES]    Frequency of Communication with Friends and Family: More than three times a week    Frequency of Social Gatherings with Friends and Family: Three times a week    Attends Religious Services: 1 to 4 times per year    Active Member of Clubs or Organizations: No    Attends Banker Meetings: Never    Marital Status: Not on file    Review of Systems: Gen: Denies fever, chills, anorexia. Denies fatigue, weakness, weight loss.  CV: Denies chest pain, palpitations, syncope, peripheral edema, and claudication. Resp: Denies dyspnea at rest, cough, wheezing, coughing up blood, and pleurisy. GI: Denies vomiting blood, jaundice, and fecal incontinence.   Denies dysphagia or odynophagia. Derm: Denies rash, itching, dry skin Psych: Denies depression, anxiety, memory loss, confusion. No homicidal or suicidal ideation.  Heme: Denies bruising, bleeding, and enlarged lymph nodes.  Physical Exam: BP 132/84 (BP Location: Right Arm, Patient Position: Sitting, Cuff Size: Large)   Pulse 85   Temp 97.8 F (36.6 C) (Temporal)   Ht 5' 5 (1.651 m)   Wt 263 lb 12.8 oz (119.7 kg)   BMI 43.90 kg/m  General:   Alert and oriented. No distress noted. Pleasant and cooperative.  Head:  Normocephalic and atraumatic. Eyes:  Conjuctiva clear without scleral icterus. Heart:  S1, S2 present without murmurs appreciated. Lungs:  Clear to auscultation bilaterally. No wheezes, rales, or rhonchi. No distress.  Abdomen:  +BS, soft, non-tender and non-distended. Mild generalized TTP. No rebound or guarding. No HSM or masses noted. Msk:  Symmetrical without gross deformities. Normal posture. Extremities:  Without edema. Neurologic:  Alert  and  oriented x4 Psych:  Normal mood and affect.    Assessment:  43 year old female with history of anxiety, asthma, HTN, presenting today for follow-up of constipation.  Also reporting rectal bleeding, nausea, GERD.  Constipation: Chronic, but worsening symptoms since starting [facility] in August.  Associated abdominal discomfort/bloating/nausea.  Symptoms previously improved with Linzess  290 mcg daily, but unfortunately she did not get a prescription for this.  I am sending in a prescription of Linzess  for her today.  May very well have component of IBS as the cause of her prior baseline symptoms.  Rectal bleeding: New onset bright red blood per rectum over the last couple weeks with a total of 4 episodes.  Denies any rectal pain, hemorrhoids, abdominal pain.  This could be secondary to benign anorectal source in setting of constipation, but unable to rule out colon polyps or malignancy.  Will proceed with colonoscopy for further evaluation and update CBC to check for anemia.  Nausea: May be multifactorial in the setting of uncontrolled constipation, GERD, and now [facility].  Planning to start pantoprazole  for GERD, Linzess  for constipation.  In light of upper abdominal tenderness, will recheck labs.  If symptoms persist, will need to consider further evaluation.  GERD: Chronic.  At least a couple times a week.  Patient states it can be pretty severe.  May be exacerbated by [facility], the patient reports symptoms predate Wegovy.  Will start her on pantoprazole  40 mg daily.  Reinforced GERD diet/lifestyle as well.  Plan:  CBC, CMP Proceed with colonoscopy with propofol  by Dr. Shaaron in near future. The risks, benefits, and alternatives have been discussed with the patient in detail. The patient states understanding and desires to proceed.  ASA 3 Hold Wegovy x 1 week prior to colonoscopy. Extra one half bowel prep due to chronic constipation. Start Linzess  290 mcg daily. Start pantoprazole  40 mg  daily. Zofran  4 mg every 8 hours as needed. GERD diet/lifestyle recommendations:  Avoid fried, fatty, greasy, spicy, citrus foods. Avoid caffeine and carbonated beverages. Avoid chocolate. Try eating 4-6 small meals a day rather than 3 large meals. Do not eat within 3 hours of laying down. Prop head of bed up on wood or bricks to create a 6 inch incline. Follow-up after colonoscopy or sooner if needed.   Josette Centers, PA-C [facility]enter[facility] 05/08/2023

## 2023-05-08 ENCOUNTER — Ambulatory Visit: Payer: Medicaid Other | Admitting: Gastroenterology

## 2023-05-08 ENCOUNTER — Encounter: Payer: Self-pay | Admitting: Gastroenterology

## 2023-05-08 ENCOUNTER — Other Ambulatory Visit: Payer: Self-pay | Admitting: Gastroenterology

## 2023-05-08 ENCOUNTER — Telehealth: Payer: Self-pay | Admitting: *Deleted

## 2023-05-08 VITALS — BP 132/84 | HR 85 | Temp 97.8°F | Ht 65.0 in | Wt 263.8 lb

## 2023-05-08 DIAGNOSIS — K625 Hemorrhage of anus and rectum: Secondary | ICD-10-CM | POA: Diagnosis not present

## 2023-05-08 DIAGNOSIS — K219 Gastro-esophageal reflux disease without esophagitis: Secondary | ICD-10-CM

## 2023-05-08 DIAGNOSIS — R110 Nausea: Secondary | ICD-10-CM | POA: Diagnosis not present

## 2023-05-08 DIAGNOSIS — K581 Irritable bowel syndrome with constipation: Secondary | ICD-10-CM | POA: Insufficient documentation

## 2023-05-08 DIAGNOSIS — K5909 Other constipation: Secondary | ICD-10-CM | POA: Diagnosis not present

## 2023-05-08 MED ORDER — ONDANSETRON HCL 4 MG PO TABS
4.0000 mg | ORAL_TABLET | Freq: Three times a day (TID) | ORAL | 0 refills | Status: DC | PRN
Start: 2023-05-08 — End: 2023-08-02

## 2023-05-08 MED ORDER — PANTOPRAZOLE SODIUM 40 MG PO TBEC
40.0000 mg | DELAYED_RELEASE_TABLET | Freq: Every day | ORAL | 3 refills | Status: DC
Start: 2023-05-08 — End: 2023-05-17

## 2023-05-08 MED ORDER — LINACLOTIDE 290 MCG PO CAPS
290.0000 ug | ORAL_CAPSULE | Freq: Every day | ORAL | 5 refills | Status: DC
Start: 2023-05-08 — End: 2023-09-13

## 2023-05-08 NOTE — Telephone Encounter (Signed)
 LMOVM to return call  TCS w/Dr.Rourk, asa 3, extra 1/2 day of bowel prep

## 2023-05-08 NOTE — Patient Instructions (Addendum)
 Please have labs completed at [facility].  We will get you scheduled for a colonoscopy in the near future with Dr. Shaaron.  You will need to hold [facility] for 1 week prior to your colonoscopy.  Start Linzess  290 mcg daily first thing in the morning for constipation.   Start pantoprazole  40 mg daily at least 30 minutes before breakfast for heartburn.  I am hopeful that this will also help with your nausea.  I am also sending in Zofran  for you to use as needed for nausea.  Follow a GERD diet/lifestyle:  Avoid fried, fatty, greasy, spicy, citrus foods. Avoid caffeine and carbonated beverages. Avoid chocolate. Try eating 4-6 small meals a day rather than 3 large meals. Do not eat within 3 hours of laying down. Prop head of bed up on wood or bricks to create a 6 inch incline.   I will see you back after your colonoscopy, but please call sooner if needed.   Josette Centers, PA-C [facility]

## 2023-05-09 ENCOUNTER — Other Ambulatory Visit: Payer: Self-pay | Admitting: *Deleted

## 2023-05-09 ENCOUNTER — Encounter: Payer: Self-pay | Admitting: *Deleted

## 2023-05-09 LAB — CBC/DIFF AMBIGUOUS DEFAULT
Basophils Absolute: 0.2 10*3/uL (ref 0.0–0.2)
Basos: 2 %
EOS (ABSOLUTE): 0.3 10*3/uL (ref 0.0–0.4)
Eos: 4 %
Hematocrit: 43.2 % (ref 34.0–46.6)
Hemoglobin: 14.1 g/dL (ref 11.1–15.9)
Immature Grans (Abs): 0 10*3/uL (ref 0.0–0.1)
Immature Granulocytes: 0 %
Lymphocytes Absolute: 2.9 10*3/uL (ref 0.7–3.1)
Lymphs: 36 %
MCH: 30.7 pg (ref 26.6–33.0)
MCHC: 32.6 g/dL (ref 31.5–35.7)
MCV: 94 fL (ref 79–97)
Monocytes Absolute: 0.3 10*3/uL (ref 0.1–0.9)
Monocytes: 4 %
Neutrophils Absolute: 4.5 10*3/uL (ref 1.4–7.0)
Neutrophils: 54 %
Platelets: 379 10*3/uL (ref 150–450)
RBC: 4.6 x10E6/uL (ref 3.77–5.28)
RDW: 13.3 % (ref 11.7–15.4)
WBC: 8.2 10*3/uL (ref 3.4–10.8)

## 2023-05-09 LAB — COMPREHENSIVE METABOLIC PANEL
ALT: 7 [IU]/L (ref 0–32)
AST: 8 [IU]/L (ref 0–40)
Albumin: 4.2 g/dL (ref 3.9–4.9)
Alkaline Phosphatase: 98 [IU]/L (ref 44–121)
BUN/Creatinine Ratio: 11 (ref 9–23)
BUN: 9 mg/dL (ref 6–24)
Bilirubin Total: 0.5 mg/dL (ref 0.0–1.2)
CO2: 25 mmol/L (ref 20–29)
Calcium: 9.3 mg/dL (ref 8.7–10.2)
Chloride: 102 mmol/L (ref 96–106)
Creatinine, Ser: 0.81 mg/dL (ref 0.57–1.00)
Globulin, Total: 2.9 g/dL (ref 1.5–4.5)
Glucose: 93 mg/dL (ref 70–99)
Potassium: 3.9 mmol/L (ref 3.5–5.2)
Sodium: 141 mmol/L (ref 134–144)
Total Protein: 7.1 g/dL (ref 6.0–8.5)
eGFR: 94 mL/min/{1.73_m2} (ref >59)

## 2023-05-09 LAB — SPECIMEN STATUS REPORT

## 2023-05-09 MED ORDER — PEG 3350-KCL-NA BICARB-NACL 420 G PO SOLR
4000.0000 mL | Freq: Once | ORAL | 0 refills | Status: AC
Start: 2023-05-09 — End: 2023-05-09

## 2023-05-15 ENCOUNTER — Encounter: Payer: Self-pay | Admitting: *Deleted

## 2023-05-15 NOTE — Telephone Encounter (Signed)
 We can certainly get patient back in the office to discuss her symptoms further. Please arrange follow-up.   Please let her know if she has any severe pain prior to her OV, she should proceed to the ER.

## 2023-05-17 ENCOUNTER — Other Ambulatory Visit (HOSPITAL_COMMUNITY)
Admission: RE | Admit: 2023-05-17 | Discharge: 2023-05-17 | Disposition: A | Discharge status: Home / Self Care | Payer: Medicaid Other | Source: Ambulatory Visit | Attending: Gastroenterology | Admitting: Gastroenterology

## 2023-05-17 ENCOUNTER — Encounter: Payer: Self-pay | Admitting: Gastroenterology

## 2023-05-17 ENCOUNTER — Ambulatory Visit (INDEPENDENT_AMBULATORY_CARE_PROVIDER_SITE_OTHER): Payer: Medicaid Other | Admitting: Gastroenterology

## 2023-05-17 ENCOUNTER — Other Ambulatory Visit: Payer: Self-pay | Admitting: Gastroenterology

## 2023-05-17 VITALS — BP 129/77 | HR 89 | Temp 97.7°F | Ht 65.0 in | Wt 266.0 lb

## 2023-05-17 DIAGNOSIS — R110 Nausea: Secondary | ICD-10-CM

## 2023-05-17 DIAGNOSIS — K219 Gastro-esophageal reflux disease without esophagitis: Secondary | ICD-10-CM | POA: Diagnosis not present

## 2023-05-17 DIAGNOSIS — K5904 Chronic idiopathic constipation: Secondary | ICD-10-CM

## 2023-05-17 DIAGNOSIS — R143 Flatulence: Secondary | ICD-10-CM

## 2023-05-17 DIAGNOSIS — K5909 Other constipation: Secondary | ICD-10-CM | POA: Diagnosis not present

## 2023-05-17 DIAGNOSIS — R1084 Generalized abdominal pain: Secondary | ICD-10-CM

## 2023-05-17 DIAGNOSIS — R1010 Upper abdominal pain, unspecified: Secondary | ICD-10-CM

## 2023-05-17 LAB — CBC WITH DIFFERENTIAL/PLATELET
Abs Immature Granulocytes: 0.01 10*3/uL (ref 0.00–0.07)
Basophils Absolute: 0.1 10*3/uL (ref 0.0–0.1)
Basophils Relative: 1 %
Eosinophils Absolute: 0.5 10*3/uL (ref 0.0–0.5)
Eosinophils Relative: 5 %
HCT: 38.7 % (ref 36.0–46.0)
Hemoglobin: 13.1 g/dL (ref 12.0–15.0)
Immature Granulocytes: 0 %
Lymphocytes Relative: 25 %
Lymphs Abs: 2.4 10*3/uL (ref 0.7–4.0)
MCH: 31.1 pg (ref 26.0–34.0)
MCHC: 33.9 g/dL (ref 30.0–36.0)
MCV: 91.9 fL (ref 80.0–100.0)
Monocytes Absolute: 0.5 10*3/uL (ref 0.1–1.0)
Monocytes Relative: 5 %
Neutro Abs: 6.2 10*3/uL (ref 1.7–7.7)
Neutrophils Relative %: 64 %
Platelets: 335 10*3/uL (ref 150–400)
RBC: 4.21 MIL/uL (ref 3.87–5.11)
RDW: 13.5 % (ref 11.5–15.5)
WBC: 9.6 10*3/uL (ref 4.0–10.5)
nRBC: 0 % (ref 0.0–0.2)

## 2023-05-17 LAB — COMPREHENSIVE METABOLIC PANEL
ALT: 8 U/L (ref 0–44)
AST: 11 U/L — ABNORMAL LOW (ref 15–41)
Albumin: 3.5 g/dL (ref 3.5–5.0)
Alkaline Phosphatase: 71 U/L (ref 38–126)
Anion gap: 6 (ref 5–15)
BUN: 8 mg/dL (ref 6–20)
CO2: 27 mmol/L (ref 22–32)
Calcium: 8.6 mg/dL — ABNORMAL LOW (ref 8.9–10.3)
Chloride: 104 mmol/L (ref 98–111)
Creatinine, Ser: 0.77 mg/dL (ref 0.44–1.00)
GFR, Estimated: >60 mL/min (ref >60)
Glucose, Bld: 96 mg/dL (ref 70–99)
Potassium: 3.5 mmol/L (ref 3.5–5.1)
Sodium: 137 mmol/L (ref 135–145)
Total Bilirubin: 0.6 mg/dL (ref 0.0–1.2)
Total Protein: 7.2 g/dL (ref 6.5–8.1)

## 2023-05-17 LAB — LIPASE, BLOOD: Lipase: 24 U/L (ref 11–51)

## 2023-05-17 MED ORDER — MOTEGRITY 2 MG PO TABS
2.0000 mg | ORAL_TABLET | Freq: Every day | ORAL | 3 refills | Status: DC
Start: 2023-05-17 — End: 2023-06-24

## 2023-05-17 MED ORDER — PANTOPRAZOLE SODIUM 40 MG PO TBEC
40.0000 mg | DELAYED_RELEASE_TABLET | Freq: Two times a day (BID) | ORAL | 3 refills | Status: DC
Start: 2023-05-17 — End: 2023-11-08

## 2023-05-17 NOTE — Progress Notes (Signed)
Referring Provider: Center, [facility] Primary Care Physician:  Center, [facility] Primary GI Physician: Dr. Jena Gauss  Chief Complaint  Patient presents with   Abdominal Pain    Stomach is sore and has lots of gas.     HPI:   [name] is a 43 y.o. female with history of anxiety, asthma, HTN, constipation, GERD, presenting today with chief complaint of abdominal pain.  Last seen in the office 05/08/2023 for constipation, rectal bleeding, nausea, GERD.  Noted worsening constipation since starting [facility] with associated abdominal discomfort/bloating/nausea.  Recommended starting Linzess 290 mcg daily.  She also reported new onset rectal bleeding over the last few weeks with a total of 4 episodes.  Recommended updating CBC and proceeding with a colonoscopy.  Also having a couple episodes of GERD per week.  Query whether Reginal Lutes had exacerbated her chronic symptoms.  Recommended starting pantoprazole 40 mg daily.  Regarding her nausea, suspected this was multifactorial in the setting of uncontrolled constipation, GERD, and now [facility].  Colonoscopy is scheduled for 06/21/2023.  Today:  Reports her abdomen has been very tender to the touch all week, like she has done 50 sit ups in 1 day.  Today feels better than it did on Monday, but still very tender.  Also having trouble with excessive gas which she can sometimes pass and other times she cannot.  She does report some chronic issues with dairy products, but has never had this much gas.  No issues with breads or Posta.  Not able to eat much. Feels very full. Sometimes pain worsens with meals, but not always.  Nausea is not as bad but she is taking Zofran twice a day.  No heartburn. Taking pantoprazole 40 mg daily.   Yesterday, she had baked chicken, green beans, and potatoes for lunch, tacos for dinner.  No weight loss since I saw her last time.  She has gained 3 pounds.  Taking Linzess 290 mcg daily starting 1/8 and has only  had 1 bowel movement which was on 1/11.  States Linzess used to work for her in the past.  No rectal bleeding since I saw her last. No melena.   No NSAIDs.   Past Medical History:  Diagnosis Date   AKI (acute kidney injury) (HCC) 08/17/2021   Allergy    banana/pineapple   Anemia    post pregnancy   Anxiety    Asthma    Autoimmune disease, not elsewhere classified(279.49) 2013   Non specific- [facility], abnormal Bone Marrow signal   Breast lump in female 11/28/2012   Has tender round mass at 10-11 0'clock right breast 4 finger breaths from [place]ola will US   Breast mass, right 08/18/2012   Chlamydia    Chronic breast pain 05/31/2015   Dysmenorrhea 03/23/2014   Dyspareunia 01/05/2014   Elevated prolactin level 02/10/2021   Was 54.5, recheck in December ________   Fibroids, intramural 08/24/2015   GERD (gastroesophageal reflux disease)    Gonorrhea    Headache(784.0)    History of abnormal cervical Pap smear 04/02/2014   Hypertension    Kidney stone    06/2021   LLQ abdominal tenderness 05/31/2015   Menorrhagia 01/05/2014   Neuromuscular disorder (HCC)    lower back and bilat leg pain   Obesity    Other and unspecified ovarian cyst 01/12/2014   Polyclonal gammopathy 07/26/2013   Insignificant   Pyelonephritis 06/28/2021   Recurrent upper respiratory infection (URI)    Scoliosis  Urticaria    Vaginal irritation 09/30/2014   Vaginal Pap smear, abnormal     Past Surgical History:  Procedure Laterality Date   ADENOIDECTOMY     BREAST BIOPSY Left 02/2021   fibroadenoma   CYSTOSCOPY W/ URETERAL STENT PLACEMENT Left 06/29/2021   Procedure: CYSTOSCOPY WITH RETROGRADE PYELOGRAM/URETERAL STENT PLACEMENT;  Surgeon: Malen Gauze, MD;  Location: [facility];  Service: Urology;  Laterality: Left;   CYSTOSCOPY WITH RETROGRADE PYELOGRAM, URETEROSCOPY AND STENT PLACEMENT Left 07/27/2021   Procedure: CYSTOSCOPY WITH RETROGRADE PYELOGRAM, URETEROSCOPY, STONE EXTRACTION WITH  BASKET;  Surgeon: Malen Gauze, MD;  Location: [facility];  Service: Urology;  Laterality: Left;   DILATATION AND CURETTAGE/HYSTEROSCOPY WITH MINERVA N/A 03/14/2021   Procedure: HYSTEROSCOPY WITH MINERVA;  Surgeon: Lazaro Arms, MD;  Location: [facility];  Service: Gynecology;  Laterality: N/A;   DILATION AND CURETTAGE OF UTERUS     LAPAROSCOPIC BILATERAL SALPINGECTOMY Bilateral 03/16/2020   Procedure: LAPAROSCOPIC BILATERAL SALPINGECTOMY;  Surgeon: Lazaro Arms, MD;  Location: [facility];  Service: Gynecology;  Laterality: Bilateral;   SHOULDER SURGERY Right 03/10/2019   TONSILLECTOMY     TUBAL LIGATION N/A    Phreesia 06/28/2020    Current Outpatient Medications  Medication Sig Dispense Refill   albuterol (PROVENTIL HFA) 108 (90 Base) MCG/ACT inhaler Inhale 2 puffs into the lungs every 6 (six) hours as needed for wheezing (cough). 8 g 5   amLODipine (NORVASC) 5 MG tablet Take 1 tablet (5 mg total) by mouth daily. 90 tablet 1   Blood Pressure Monitoring (BLOOD PRESSURE CUFF) MISC Large cuff. ICD10: I10. Check blood pressure once daily. Please contact office if blood pressure above 150/90. 1 each 0   hydrochlorothiazide (MICROZIDE) 12.5 MG capsule Take 1 capsule (12.5 mg total) by mouth daily. 90 capsule 1   linaclotide (LINZESS) 290 MCG CAPS capsule Take 1 capsule (290 mcg total) by mouth daily before breakfast. 30 capsule 5   ondansetron (ZOFRAN) 4 MG tablet Take 1 tablet (4 mg total) by mouth every 8 (eight) hours as needed for nausea or vomiting. 30 tablet 0   pantoprazole (PROTONIX) 40 MG tablet Take 1 tablet (40 mg total) by mouth daily before breakfast. 30 tablet 3   Prucalopride Succinate (MOTEGRITY) 2 MG TABS Take 1 tablet (2 mg total) by mouth daily. 30 tablet 3   WEGOVY 0.5 MG/0.5ML SOAJ Inject 0.5 mg into the skin once a week.     fluconazole (DIFLUCAN) 150 MG tablet Take 1 tablet (150 mg total) by mouth every 3 (three) days as needed. (Patient not taking: Reported on 05/17/2023) 2  tablet 0   fluticasone-salmeterol (ADVAIR HFA) 115-21 MCG/ACT inhaler Inhale 2 puffs into the lungs 2 (two) times daily. (Patient not taking: Reported on 05/17/2023) 1 each 12   rOPINIRole (REQUIP) 1 MG tablet Take 1 tablet (1 mg total) by mouth at bedtime. (Patient not taking: Reported on 05/17/2023) 30 tablet 3   No current facility-administered medications for this visit.    Allergies as of 05/17/2023 - Review Complete 05/17/2023  Allergen Reaction Noted   Banana Rash 12/07/2011   Pineapple Rash 12/07/2011    Family History  Problem Relation Age of Onset   Hypertension Mother    Diabetes Mother    Heart disease Mother    Diabetes Father    Hypertension Maternal Grandmother    Diabetes Son        pre diabetic   Other Son        overactive bladder  COPD Maternal Aunt    Leukemia Maternal Aunt    Cancer Paternal Aunt        breast   Allergic rhinitis Neg Hx    Angioedema Neg Hx    Asthma Neg Hx    Atopy Neg Hx    Eczema Neg Hx    Immunodeficiency Neg Hx    Urticaria Neg Hx    Colon cancer Neg Hx     Social History   Socioeconomic History   Marital status: Divorced    Spouse name: Sales promotion account executive   Number of children: 2   Years of education: Not on file   Highest education level: Not on file  Occupational History   Not on file  Tobacco Use   Smoking status: Never   Smokeless tobacco: Never  Vaping Use   Vaping status: Never Used  Substance and Sexual Activity   Alcohol use: No   Drug use: No   Sexual activity: Yes    Birth control/protection: Surgical    Comment: tubal & ablation  Other Topics Concern   Not on file  Social History Narrative   Not on file   Social Drivers of Health   Financial Resource Strain: Medium Risk (02/12/2022)   Overall Financial Resource Strain (CARDIA)    Difficulty of Paying Living Expenses: Somewhat hard  Food Insecurity: No Food Insecurity (02/12/2022)   Hunger Vital Sign    Worried About Running Out of Food in the Last Year:  Never true    Ran Out of Food in the Last Year: Never true  Transportation Needs: No Transportation Needs (04/20/2022)   PRAPARE - Administrator, Civil Service (Medical): No    Lack of Transportation (Non-Medical): No  Physical Activity: Insufficiently Active (02/23/2020)   Exercise Vital Sign    Days of Exercise per Week: 2 days    Minutes of Exercise per Session: 10 min  Stress: Stress Concern Present (09/02/2021)   [employer] of Occupational Health - Occupational Stress Questionnaire    Feeling of Stress : Rather much  Social Connections: Unknown (09/01/2021)   Social Connection and Isolation Panel [NHANES]    Frequency of Communication with Friends and Family: More than three times a week    Frequency of Social Gatherings with Friends and Family: Three times a week    Attends Religious Services: 1 to 4 times per year    Active Member of Clubs or Organizations: No    Attends Banker Meetings: Never    Marital Status: Not on file    Review of Systems: Gen: Denies fever, chills, cold or flu like symptoms, pre-syncope, or syncope.  CV: Denies chest pain, palpitations.  Resp: Denies dyspnea, cough.  GI: See HPI. Heme: See HPI  Physical Exam: BP 129/77 (BP Location: Right Arm, Patient Position: Sitting, Cuff Size: Large)   Pulse 89   Temp 97.7 F (36.5 C) (Temporal)   Ht 5\' 5"  (1.651 m)   Wt 266 lb (120.7 kg)   BMI 44.26 kg/m  General:   Alert and oriented. No distress noted. Pleasant and cooperative.  Head:  Normocephalic and atraumatic. Eyes:  Conjuctiva clear without scleral icterus. Heart:  S1, S2 present without murmurs appreciated. Lungs:  Clear to auscultation bilaterally. No wheezes, rales, or rhonchi. No distress.  Abdomen:  +BS, soft, and non-distended. Moderate TTP in epigastric area. Mild TTP in LUQ, RLQ, and LLQ. No rebound or guarding. No HSM or masses noted. Msk:  Symmetrical without gross deformities. Normal  posture. Extremities:   Without edema. Neurologic:  Alert and  oriented x4 Psych:  Normal mood and affect.    Assessment:  43 y.o. female with history of anxiety, asthma, HTN, constipation, GERD, presenting today with chief complaint of abdominal pain.   Generalized abdominal pain:  Previously with mild generalized abdominal pain felt to be secondary to uncontrolled GERD, constipation, and possibly Wegovy, but now with increased abdominal pain starting 5 days ago, most severe in the upper abdomen though severity has improved since onset.  GERD now well-controlled on pantoprazole daily.    Symptoms may still be related to uncontrolled constipation and Wegovy, but will also need to rule out pancreatitis in the setting of Wegovy.  I will plan to check labs today and add Motegrity for constipation.  If no significant laboratory abnormalities, we will plan to increase pantoprazole to 40 mg twice daily and arrange EGD with upcoming colonoscopy to rule out PUD, gastritis, duodenitis, H. pylori, gastric outlet obstruction.  Nausea: Currently controlling this with Zofran twice daily.  Previously felt uncontrolled GERD was likely contributing to her symptoms, but GERD is now well-controlled on pantoprazole 40 mg daily.  Uncontrolled constipation and Reginal Lutes are likely still contributing to her symptoms, but in light of increasing upper abdominal pain, worsening early satiety, and and tenderness most significant in the epigastric area on exam today, unable to rule out PUD, H. pylori, gastritis, duodenitis, or possibly pancreatitis in the setting of Wegovy.  Planning to update labs today.  No acute abnormalities, will add on EGD for further evaluation.  Chronic constipation: Now likely being exacerbated by [facility].  Started Linzess 290 mcg on 1/8 which previously worked for her, but now with inadequate response.  Will try her on Motegrity.  Advised that she can add Linzess to Motegrity if needed.  Previously with rectal bleeding as  discussed at last office visit and is currently scheduled for colonoscopy on 06/21/2023.  Excessive gas: Likely related to uncontrolled constipation, lactose intolerance.   Plan:  CBC, CMP, lipase stat If lipase is elevated suggesting pancreatitis, will have patient proceed to the ER for further evaluation/management. If no significant laboratory abnormalities, will increase pantoprazole to 40 mg twice daily and arrange EGD. Stop Linzess and start Motegrity 2 mg daily.  If Motegrity alone is not allowing for adequate bowel movements, can add Linzess back to [employer]. Lactose-free diet or 3 Lactaid tablets prior to dairy consumption. Daily probiotic. Keep plans for colonoscopy as scheduled on 06/21/2023.   Ermalinda Memos, PA-C [facility] 05/17/2023

## 2023-05-17 NOTE — Patient Instructions (Signed)
Please have blood work completed at [facility].    Stop Linzess and start Motegrity 2 mg daily for constipation. After taking Motegrity for couple of days, if you are not having any results, you can take Linzess with Motegrity.  Continue pantoprazole 40 mg daily.  Try starting a daily probiotic such as align, digestive advantage, Vear Clock' colon health.  Follow a lactose-free diet or take 3 Lactaid tablets prior to dairy consumption.  I will reach out to you this afternoon with your lab results and further recommendations.  Ermalinda Memos, PA-C [facility]

## 2023-05-20 ENCOUNTER — Encounter: Payer: Self-pay | Admitting: *Deleted

## 2023-05-26 ENCOUNTER — Emergency Department (HOSPITAL_COMMUNITY): Payer: Medicaid Other

## 2023-05-26 ENCOUNTER — Other Ambulatory Visit: Payer: Self-pay

## 2023-05-26 ENCOUNTER — Emergency Department (HOSPITAL_COMMUNITY)
Admission: EM | Admit: 2023-05-26 | Discharge: 2023-05-26 | Disposition: A | Discharge status: Home / Self Care | Payer: Medicaid Other | Attending: Emergency Medicine | Admitting: Emergency Medicine

## 2023-05-26 ENCOUNTER — Encounter (HOSPITAL_COMMUNITY): Payer: Self-pay

## 2023-05-26 DIAGNOSIS — K2900 Acute gastritis without bleeding: Secondary | ICD-10-CM | POA: Diagnosis not present

## 2023-05-26 DIAGNOSIS — J45909 Unspecified asthma, uncomplicated: Secondary | ICD-10-CM | POA: Insufficient documentation

## 2023-05-26 DIAGNOSIS — I10 Essential (primary) hypertension: Secondary | ICD-10-CM | POA: Insufficient documentation

## 2023-05-26 DIAGNOSIS — R112 Nausea with vomiting, unspecified: Secondary | ICD-10-CM | POA: Diagnosis present

## 2023-05-26 DIAGNOSIS — A084 Viral intestinal infection, unspecified: Secondary | ICD-10-CM

## 2023-05-26 DIAGNOSIS — Z7951 Long term (current) use of inhaled steroids: Secondary | ICD-10-CM | POA: Diagnosis not present

## 2023-05-26 DIAGNOSIS — Z79899 Other long term (current) drug therapy: Secondary | ICD-10-CM | POA: Diagnosis not present

## 2023-05-26 DIAGNOSIS — Z20822 Contact with and (suspected) exposure to covid-19: Secondary | ICD-10-CM | POA: Diagnosis not present

## 2023-05-26 LAB — CBC WITH DIFFERENTIAL/PLATELET
Abs Immature Granulocytes: 0.02 10*3/uL (ref 0.00–0.07)
Basophils Absolute: 0 10*3/uL (ref 0.0–0.1)
Basophils Relative: 0 %
Eosinophils Absolute: 0 10*3/uL (ref 0.0–0.5)
Eosinophils Relative: 0 %
HCT: 40.2 % (ref 36.0–46.0)
Hemoglobin: 13.5 g/dL (ref 12.0–15.0)
Immature Granulocytes: 0 %
Lymphocytes Relative: 6 %
Lymphs Abs: 0.5 10*3/uL — ABNORMAL LOW (ref 0.7–4.0)
MCH: 30.8 pg (ref 26.0–34.0)
MCHC: 33.6 g/dL (ref 30.0–36.0)
MCV: 91.8 fL (ref 80.0–100.0)
Monocytes Absolute: 0.3 10*3/uL (ref 0.1–1.0)
Monocytes Relative: 3 %
Neutro Abs: 7.7 10*3/uL (ref 1.7–7.7)
Neutrophils Relative %: 91 %
Platelets: 325 10*3/uL (ref 150–400)
RBC: 4.38 MIL/uL (ref 3.87–5.11)
RDW: 13.4 % (ref 11.5–15.5)
WBC: 8.5 10*3/uL (ref 4.0–10.5)
nRBC: 0 % (ref 0.0–0.2)

## 2023-05-26 LAB — RESP PANEL BY RT-PCR (RSV, FLU A&B, COVID)  RVPGX2
Influenza A by PCR: NEGATIVE
Influenza B by PCR: NEGATIVE
Resp Syncytial Virus by PCR: NEGATIVE
SARS Coronavirus 2 by RT PCR: NEGATIVE

## 2023-05-26 LAB — COMPREHENSIVE METABOLIC PANEL
ALT: 8 U/L (ref 0–44)
AST: 12 U/L — ABNORMAL LOW (ref 15–41)
Albumin: 3.7 g/dL (ref 3.5–5.0)
Alkaline Phosphatase: 72 U/L (ref 38–126)
Anion gap: 11 (ref 5–15)
BUN: 8 mg/dL (ref 6–20)
CO2: 23 mmol/L (ref 22–32)
Calcium: 8.9 mg/dL (ref 8.9–10.3)
Chloride: 105 mmol/L (ref 98–111)
Creatinine, Ser: 0.68 mg/dL (ref 0.44–1.00)
GFR, Estimated: >60 mL/min (ref >60)
Glucose, Bld: 107 mg/dL — ABNORMAL HIGH (ref 70–99)
Potassium: 3.1 mmol/L — ABNORMAL LOW (ref 3.5–5.1)
Sodium: 139 mmol/L (ref 135–145)
Total Bilirubin: 0.9 mg/dL (ref 0.0–1.2)
Total Protein: 7.7 g/dL (ref 6.5–8.1)

## 2023-05-26 LAB — LIPASE, BLOOD: Lipase: 23 U/L (ref 11–51)

## 2023-05-26 MED ORDER — DICYCLOMINE HCL 10 MG/5ML PO SOLN
10.0000 mg | Freq: Once | ORAL | Status: DC
  Filled 2023-05-26: qty 5

## 2023-05-26 MED ORDER — PROCHLORPERAZINE MALEATE 10 MG PO TABS: 10.0000 mg | ORAL_TABLET | Freq: Two times a day (BID) | ORAL | 0 refills | Status: DC | PRN

## 2023-05-26 MED ORDER — POTASSIUM CHLORIDE CRYS ER 20 MEQ PO TBCR
40.0000 meq | EXTENDED_RELEASE_TABLET | Freq: Once | ORAL | Status: AC
  Administered 2023-05-26: 40 meq via ORAL
  Filled 2023-05-26: qty 2

## 2023-05-26 MED ORDER — HYOSCYAMINE SULFATE 0.125 MG PO TABS
0.2500 mg | ORAL_TABLET | Freq: Once | ORAL | Status: AC
  Administered 2023-05-26: 0.25 mg via ORAL
  Filled 2023-05-26: qty 2

## 2023-05-26 MED ORDER — DIPHENHYDRAMINE HCL 50 MG/ML IJ SOLN
12.5000 mg | Freq: Once | INTRAMUSCULAR | Status: AC
  Administered 2023-05-26: 12.5 mg via INTRAVENOUS
  Filled 2023-05-26: qty 1

## 2023-05-26 MED ORDER — SODIUM CHLORIDE 0.9 % IV BOLUS
1000.0000 mL | Freq: Once | INTRAVENOUS | Status: AC
  Administered 2023-05-26: 1000 mL via INTRAVENOUS

## 2023-05-26 MED ORDER — ALUM & MAG HYDROXIDE-SIMETH 200-200-20 MG/5ML PO SUSP
30.0000 mL | Freq: Once | ORAL | Status: AC
  Administered 2023-05-26: 30 mL via ORAL
  Filled 2023-05-26: qty 30

## 2023-05-26 MED ORDER — KETOROLAC TROMETHAMINE 30 MG/ML IJ SOLN
15.0000 mg | Freq: Once | INTRAMUSCULAR | Status: AC
  Administered 2023-05-26: 15 mg via INTRAVENOUS
  Filled 2023-05-26: qty 1

## 2023-05-26 MED ORDER — PROCHLORPERAZINE EDISYLATE 10 MG/2ML IJ SOLN
10.0000 mg | Freq: Once | INTRAMUSCULAR | Status: AC
  Administered 2023-05-26: 10 mg via INTRAVENOUS
  Filled 2023-05-26: qty 2

## 2023-05-26 NOTE — ED Triage Notes (Signed)
Pt reports her children have been sick with vomiting and diarrhea and now she has bad nausea with acid reflux and has had one loose stool since 6am.

## 2023-05-26 NOTE — ED Provider Notes (Signed)
[place] EMERGENCY DEPARTMENT AT [facility] Provider Note   CSN: [id number] Arrival date & time: 05/26/23  1649     History  Chief Complaint  Patient presents with   Nausea    [name] is a 43 y.o. female with history including HTN, GERD, asthma, chronic constipation under the care of Dr Jena Gauss,  to start on Motegrity this week when Linzess stopped working for her,  now with periumbilical pain associated with 2 episodes of soft stools and nausea, including burning pain in the epigastric region along with pharyngeal water brash reflux type sx.  She is on zofran and omeprazole which she took today and was not helpful. She also endorses generalized headache since her symptoms began. No visual changes,  stiff neck, fever, focal weakness.  States her 2 teenage children had diarrhea, n/v x 2 days, starting on Friday, with their sx being better.  The history is provided by the patient.       Home Medications Prior to Admission medications   Medication Sig Start Date End Date Taking? Authorizing Provider  albuterol (PROVENTIL HFA) 108 (90 Base) MCG/ACT inhaler Inhale 2 puffs into the lungs every 6 (six) hours as needed for wheezing (cough). 09/30/20   Anabel Halon, MD  amLODipine (NORVASC) 5 MG tablet Take 1 tablet (5 mg total) by mouth daily. 06/12/22   Anabel Halon, MD  Blood Pressure Monitoring (BLOOD PRESSURE CUFF) MISC Large cuff. ICD10: I10. Check blood pressure once daily. Please contact office if blood pressure above 150/90. 08/01/21   Anabel Halon, MD  fluconazole (DIFLUCAN) 150 MG tablet Take 1 tablet (150 mg total) by mouth every 3 (three) days as needed. Patient not taking: Reported on 05/17/2023 04/30/23   Margaretann Loveless, PA-C  fluticasone-salmeterol (ADVAIR HFA) [phone] MCG/ACT inhaler Inhale 2 puffs into the lungs 2 (two) times daily. Patient not taking: Reported on 05/17/2023 09/30/20   Anabel Halon, MD  hydrochlorothiazide (MICROZIDE) 12.5  MG capsule Take 1 capsule (12.5 mg total) by mouth daily. 06/12/22   Anabel Halon, MD  linaclotide [facility]) 290 MCG CAPS capsule Take 1 capsule (290 mcg total) by mouth daily before breakfast. 05/08/23   Letta Median, PA-C  ondansetron (ZOFRAN) 4 MG tablet Take 1 tablet (4 mg total) by mouth every 8 (eight) hours as needed for nausea or vomiting. 05/08/23   Letta Median, PA-C  pantoprazole (PROTONIX) 40 MG tablet Take 1 tablet (40 mg total) by mouth 2 (two) times daily before a meal. 05/17/23   Letta Median, PA-C  Prucalopride Succinate (MOTEGRITY) 2 MG TABS Take 1 tablet (2 mg total) by mouth daily. 05/17/23   Letta Median, PA-C  rOPINIRole (REQUIP) 1 MG tablet Take 1 tablet (1 mg total) by mouth at bedtime. Patient not taking: Reported on 05/17/2023 05/15/22   Anabel Halon, MD  WEGOVY 0.5 MG/0.5ML SOAJ Inject 0.5 mg into the skin once a week. 01/16/23   [provider]      Allergies    Banana and Pineapple    Review of Systems   Review of Systems  Constitutional:  Negative for chills and fever.  HENT:  Negative for congestion.   Eyes: Negative.   Respiratory:  Negative for chest tightness and shortness of breath.   Cardiovascular:  Negative for chest pain.  Gastrointestinal:  Positive for abdominal pain, diarrhea and nausea. Negative for vomiting.  Genitourinary: Negative.   Musculoskeletal:  Negative for arthralgias, joint swelling  and neck pain.  Skin: Negative.  Negative for rash and wound.  Neurological:  Negative for dizziness, weakness, light-headedness, numbness and headaches.  Psychiatric/Behavioral: Negative.      Physical Exam Updated Vital Signs BP (!) 140/82 (BP Location: Right Arm)   Pulse 97   Temp 98.7 F (37.1 C)   Resp 18   Ht 5\' 5"  (1.651 m)   Wt 117.9 kg   SpO2 100%   BMI 43.27 kg/m  Physical Exam Vitals and nursing note reviewed.  Constitutional:      Appearance: She is well-developed.  HENT:     Head: Normocephalic and  atraumatic.  Eyes:     Conjunctiva/sclera: Conjunctivae normal.  Cardiovascular:     Rate and Rhythm: Normal rate and regular rhythm.     Heart sounds: Normal heart sounds.  Pulmonary:     Effort: Pulmonary effort is normal.     Breath sounds: Normal breath sounds. No wheezing.  Abdominal:     General: Bowel sounds are normal. There is no distension.     Palpations: Abdomen is soft.     Tenderness: There is generalized abdominal tenderness. There is no guarding or rebound. Negative signs include Murphy's sign and McBurney's sign.     Hernia: No hernia is present.  Musculoskeletal:        General: Normal range of motion.     Cervical back: Normal range of motion.  Skin:    General: Skin is warm and dry.  Neurological:     Mental Status: She is alert.     ED Results / Procedures / Treatments   Labs (all labs ordered are listed, but only abnormal results are displayed) Labs Reviewed  CBC WITH DIFFERENTIAL/PLATELET - Abnormal; Notable for the following components:      Result Value   Lymphs Abs 0.5 (*)    All other components within normal limits  COMPREHENSIVE METABOLIC PANEL - Abnormal; Notable for the following components:   Potassium 3.1 (*)    Glucose, Bld 107 (*)    AST 12 (*)    All other components within normal limits  RESP PANEL BY RT-PCR (RSV, FLU A&B, COVID)  [id number]  LIPASE, BLOOD    EKG None  Radiology DG Abd 2 Views Result Date: 05/26/2023 CLINICAL DATA:  h/o constipation, now with diarrhea, concern for constipation with overflow diarrhea. EXAM: ABDOMEN - 2 VIEW COMPARISON:  None Available. FINDINGS: Supine and upright views of the abdomen obtained. Air-fluid level in the stomach. No small bowel obstruction or abnormal distention. No significant formed stool in the colon. No radiopaque calculi or abnormal soft tissue calcifications. The included lung bases are clear. IMPRESSION: 1. No bowel obstruction. 2. No significant formed stool in the colon.  Electronically Signed   By: Narda Rutherford M.D.   On: 05/26/2023 19:16    Procedures Procedures    Medications Ordered in ED Medications  alum & mag hydroxide-simeth (MAALOX/MYLANTA) 200-200-20 MG/5ML suspension 30 mL (30 mLs Oral Given 05/26/23 1925)  hyoscyamine (LEVSIN) tablet 0.25 mg (0.25 mg Oral Given 05/26/23 1926)  potassium chloride SA (KLOR-CON M) CR tablet 40 mEq (40 mEq Oral Given 05/26/23 2044)  prochlorperazine (COMPAZINE) injection 10 mg (10 mg Intravenous Given 05/26/23 2138)  diphenhydrAMINE (BENADRYL) injection 12.5 mg (12.5 mg Intravenous Given 05/26/23 2138)  sodium chloride 0.9 % bolus 1,000 mL (1,000 mLs Intravenous New Bag/Given 05/26/23 2137)  ketorolac (TORADOL) 30 MG/ML injection 15 mg (15 mg Intravenous Given 05/26/23 2138)    ED Course/ Medical  Decision Making/ A&P                                 Medical Decision Making Pt with history of chronic constipation, just started on Motegrity, now with periumbilical pain, nausea without emesis, soft but not watery diarrhea, afebrile.  Children had similar sx x 2 days but theirs resolved, sx could be viral GI vs bacterial  considering childrens recent illness,    DDX also including  IBS, considered sbo, but exam does not suggest this condition.   She was given maalox and levsin with some improvement in abdominal sx,  but with persistent headache.  IVF, compazine, benadryl and toral given for headache.    At recheck, pt feeling much improved.  Headache better,  no nausea, also no diarrhea while here.  Suspect pt has viral GI illness her children just got over.     Amount and/or Complexity of Data Reviewed Labs: ordered.    Details: Labs reviewed,  cbc, respiratory panel, lipase normal.  CMET normal except for K+ 3.1 -replaced orally.  Radiology: ordered.    Details: 2 view abd, no obstipation, normal bowel gas pattern, no obstruction.   Risk OTC drugs. Prescription drug management.           Final  Clinical Impression(s) / ED Diagnoses Final diagnoses:  Acute gastritis without hemorrhage, unspecified gastritis type    Rx / DC Orders ED Discharge Orders     None         Victoriano Lain 05/26/23 2243    Jacalyn Lefevre, MD 05/26/23 2312

## 2023-05-26 NOTE — Discharge Instructions (Addendum)
Rest and make sure you are drinking plenty of fluids.  You have been prescribed additional compazine in case your nausea returns.   The BRAT diet - banana, rice, applesauce and dry toast are foods that sit well on an upset stomach.

## 2023-06-09 ENCOUNTER — Telehealth: Payer: Medicaid Other | Admitting: Family

## 2023-06-09 DIAGNOSIS — B3731 Acute candidiasis of vulva and vagina: Secondary | ICD-10-CM | POA: Diagnosis not present

## 2023-06-09 MED ORDER — FLUCONAZOLE 150 MG PO TABS: 150.0000 mg | ORAL_TABLET | ORAL | 0 refills | Status: DC | PRN

## 2023-06-09 NOTE — Progress Notes (Signed)

## 2023-06-10 ENCOUNTER — Telehealth: Payer: Self-pay | Admitting: *Deleted

## 2023-06-10 NOTE — Telephone Encounter (Signed)
 Patient called in stating she knows for a fact she is not going to be able to drink that gallon jug and she has to do an extra 1/2 in addition to that. She is requesting a smaller prep. Patient is not scheduled until 2/21. Will send to [facility] to address once she returns next week.

## 2023-06-16 ENCOUNTER — Telehealth: Payer: Medicaid Other | Admitting: Nurse Practitioner

## 2023-06-16 DIAGNOSIS — J329 Chronic sinusitis, unspecified: Secondary | ICD-10-CM

## 2023-06-16 DIAGNOSIS — B9789 Other viral agents as the cause of diseases classified elsewhere: Secondary | ICD-10-CM | POA: Diagnosis not present

## 2023-06-16 MED ORDER — IPRATROPIUM BROMIDE 0.03 % NA SOLN: 2.0000 | Freq: Two times a day (BID) | NASAL | 12 refills | Status: DC

## 2023-06-16 NOTE — Progress Notes (Signed)
 E-Visit for Sinus Problems  We are sorry that you are not feeling well.  Here is how we plan to help!  Providers prescribe antibiotics to treat infections caused by bacteria. Antibiotics are very powerful in treating bacterial infections when they are used properly. To maintain their effectiveness, they should be used only when necessary. Overuse of antibiotics has resulted in the development of superbugs that are resistant to treatment!    After careful review of your answers, I would not recommend an antibiotic for your condition.  Antibiotics are not effective against viruses and therefore should not be used to treat them. Common examples of infections caused by viruses include colds and flu   Based on what you have shared with me it looks like you have sinusitis.  Sinusitis is inflammation and infection in the sinus cavities of the head.  Based on your presentation I believe you most likely have Acute Viral Sinusitis.This is an infection most likely caused by a virus. There is not specific treatment for viral sinusitis other than to help you with the symptoms until the infection runs its course.  You may use an oral decongestant such as Mucinex D or if you have glaucoma or high blood pressure use plain Mucinex. Saline nasal spray help and can safely be used as often as needed for congestion, I have prescribed: Ipratropium Bromide nasal spray 0.03% 2 sprays in eah nostril 2-3 times a day If you are not feeling better in 5-7 days please feel free to reach back out to [place].   Some authorities believe that zinc sprays or the use of Echinacea may shorten the course of your symptoms.  Sinus infections are not as easily transmitted as other respiratory infection, however we still recommend that you avoid close contact with loved ones, especially the very young and elderly.  Remember to wash your hands thoroughly throughout the day as this is the number one way to prevent the spread of infection!  Home  Care: Only take medications as instructed by your medical team. Do not take these medications with alcohol. A steam or ultrasonic humidifier can help congestion.  You can place a towel over your head and breathe in the steam from hot water coming from a faucet. Avoid close contacts especially the very young and the elderly. Cover your mouth when you cough or sneeze. Always remember to wash your hands.  Get Help Right Away If: You develop worsening fever or sinus pain. You develop a severe head ache or visual changes. Your symptoms persist after you have completed your treatment plan.  Make sure you Understand these instructions. Will watch your condition. Will get help right away if you are not doing well or get worse.   Thank you for choosing an e-visit.  Your e-visit answers were reviewed by a board certified advanced clinical practitioner to complete your personal care plan. Depending upon the condition, your plan could have included both over the counter or prescription medications.  Please review your pharmacy choice. Make sure the pharmacy is open so you can pick up prescription now. If there is a problem, you may contact your provider through [employer] and have the prescription routed to another pharmacy.  Your safety is important to [place]. If you have drug allergies check your prescription carefully.   For the next 24 hours you can use MyChart to ask questions about today's visit, request a non-urgent call back, or ask for a work or school excuse. You will get an email  in the next two days asking about your experience. I hope that your e-visit has been valuable and will speed your recovery.

## 2023-06-16 NOTE — Progress Notes (Signed)
 I have spent 5 minutes in review of e-visit questionnaire, review and updating patient chart, medical decision making and response to patient.   Claiborne Rigg, NP

## 2023-06-17 NOTE — Telephone Encounter (Signed)
 Can we change to smaller volume prep but still with same instructions of extra 1/2 day of prep that [facility] requested?

## 2023-06-17 NOTE — Patient Instructions (Signed)
 [name] Current  06/17/2023     @PREFPERIOPPHARMACY @   Your procedure is scheduled on  06/21/2023.   Report to [name] at  0800  A.M.   Call this number if you have problems the morning of surgery:  [phone]  If you experience any cold or flu symptoms such as cough, fever, chills, shortness of breath, etc. between now and your scheduled surgery, please notify us at the above number.   Remember:  Follow the diet and prep instructions given to you by the office.   You may drink clear liquids until 0600 am on 06/21/2023.   Clear liquids allowed are:                    Water, Juice (No red color; non-citric and without pulp; diabetics please choose diet or no sugar options), Carbonated beverages (diabetics please choose diet or no sugar options), Clear Tea (No creamer, milk, or cream, including half & half and powdered creamer), Black Coffee Only (No creamer, milk or cream, including half & half and powdered creamer), and Clear Sports drink (No red color; diabetics please choose diet or no sugar options)          Use your inhalers before you come and bring your rescue inhaler with you.    Take these medicines the morning of surgery with A SIP OF WATER          amlodipine, pantoprazole, zofran (If needed).    Do not wear jewelry, make-up or nail polish, including gel polish,  artificial nails, or any other type of covering on natural nails (fingers and  toes).  Do not wear lotions, powders, or perfumes, or deodorant.  Do not shave 48 hours prior to surgery.  Men may shave face and neck.  Do not bring valuables to the hospital.  [facility] is not responsible for any belongings or valuables.  Contacts, dentures or bridgework may not be worn into surgery.  Leave your suitcase in the car.  After surgery it may be brought to your room.  For patients admitted to the hospital, discharge time will be determined by your treatment team.  Patients discharged the  day of surgery will not be allowed to drive home and must have someone with them for 24 hours.    Special instructions:   DO NOT smoke tobacco or vape for 24 hours before your procedure.  Please read over the following fact sheets that you were given. Anesthesia Post-op Instructions and Care and Recovery After Surgery      Upper Endoscopy, Adult, Care After After the procedure, it is common to have a sore throat. It is also common to have: Mild stomach pain or discomfort. Bloating. Nausea. Follow these instructions at home: The instructions below may help you care for yourself at home. Your health care provider may give you more instructions. If you have questions, ask your health care provider. If you were given a sedative during the procedure, it can affect you for several hours. Do not drive or operate machinery until your health care provider says that it is safe. If you will be going home right after the procedure, plan to have a responsible adult: Take you home from the hospital or clinic. You will not be allowed to drive. Care for you for the time you are told. Follow instructions from your health care provider about what you may eat and drink. Return to your normal  activities as told by your health care provider. Ask your health care provider what activities are safe for you. Take over-the-counter and prescription medicines only as told by your health care provider. Contact a health care provider if you: Have a sore throat that lasts longer than one day. Have trouble swallowing. Have a fever. Get help right away if you: Vomit blood or your vomit looks like coffee grounds. Have bloody, black, or tarry stools. Have a very bad sore throat or you cannot swallow. Have difficulty breathing or very bad pain in your chest or abdomen. These symptoms may be an emergency. Get help right away. Call 911. Do not wait to see if the symptoms will go away. Do not drive yourself to the  hospital. Summary After the procedure, it is common to have a sore throat, mild stomach discomfort, bloating, and nausea. If you were given a sedative during the procedure, it can affect you for several hours. Do not drive until your health care provider says that it is safe. Follow instructions from your health care provider about what you may eat and drink. Return to your normal activities as told by your health care provider. This information is not intended to replace advice given to you by your health care provider. Make sure you discuss any questions you have with your health care provider. Document Revised: 07/26/2021 Document Reviewed: 07/26/2021 Elsevier Patient Education  2024 Else[employer].Colonoscopy, Adult, Care After The following information offers guidance on how to care for yourself after your procedure. Your health care provider may also give you more specific instructions. If you have problems or questions, contact your health care provider. What can I expect after the procedure? After the procedure, it is common to have: A small amount of blood in your stool for 24 hours after the procedure. Some gas. Mild cramping or bloating of your abdomen. Follow these instructions at home: Eating and drinking  Drink enough fluid to keep your urine pale yellow. Follow instructions from your health care provider about eating or drinking restrictions. Resume your normal diet as told by your health care provider. Avoid heavy or fried foods that are hard to digest. Activity Rest as told by your health care provider. Avoid sitting for a long time without moving. Get up to take short walks every 1-2 hours. This is important to improve blood flow and breathing. Ask for help if you feel weak or unsteady. Return to your normal activities as told by your health care provider. Ask your health care provider what activities are safe for you. Managing cramping and bloating  Try walking around  when you have cramps or feel bloated. If directed, apply heat to your abdomen as told by your health care provider. Use the heat source that your health care provider recommends, such as a moist heat pack or a heating pad. Place a towel between your skin and the heat source. Leave the heat on for 20-30 minutes. Remove the heat if your skin turns bright red. This is especially important if you are unable to feel pain, heat, or cold. You have a greater risk of getting burned. General instructions If you were given a sedative during the procedure, it can affect you for several hours. Do not drive or operate machinery until your health care provider says that it is safe. For the first 24 hours after the procedure: Do not sign important documents. Do not drink alcohol. Do your regular daily activities at a slower pace than normal. Eat  soft foods that are easy to digest. Take over-the-counter and prescription medicines only as told by your health care provider. Keep all follow-up visits. This is important. Contact a health care provider if: You have blood in your stool 2-3 days after the procedure. Get help right away if: You have more than a small spotting of blood in your stool. You have large blood clots in your stool. You have swelling of your abdomen. You have nausea or vomiting. You have a fever. You have increasing pain in your abdomen that is not relieved with medicine. These symptoms may be an emergency. Get help right away. Call 911. Do not wait to see if the symptoms will go away. Do not drive yourself to the hospital. Summary After the procedure, it is common to have a small amount of blood in your stool. You may also have mild cramping and bloating of your abdomen. If you were given a sedative during the procedure, it can affect you for several hours. Do not drive or operate machinery until your health care provider says that it is safe. Get help right away if you have a lot of  blood in your stool, nausea or vomiting, a fever, or increased pain in your abdomen. This information is not intended to replace advice given to you by your health care provider. Make sure you discuss any questions you have with your health care provider. Document Revised: 05/29/2022 Document Reviewed: 12/07/2020 Elsevier Patient Education  2024 Elsevier Inc.Monitored Anesthesia Care, Care After The following information offers guidance on how to care for yourself after your procedure. Your health care provider may also give you more specific instructions. If you have problems or questions, contact your health care provider. What can I expect after the procedure? After the procedure, it is common to have: Tiredness. Little or no memory about what happened during or after the procedure. Impaired judgment when it comes to making decisions. Nausea or vomiting. Some trouble with balance. Follow these instructions at home: For the time period you were told by your health care provider:  Rest. Do not participate in activities where you could fall or become injured. Do not drive or use machinery. Do not drink alcohol. Do not take sleeping pills or medicines that cause drowsiness. Do not make important decisions or sign legal documents. Do not take care of children on your own. Medicines Take over-the-counter and prescription medicines only as told by your health care provider. If you were prescribed antibiotics, take them as told by your health care provider. Do not stop using the antibiotic even if you start to feel better. Eating and drinking Follow instructions from your health care provider about what you may eat and drink. Drink enough fluid to keep your urine pale yellow. If you vomit: Drink clear fluids slowly and in small amounts as you are able. Clear fluids include water, ice chips, low-calorie sports drinks, and fruit juice that has water added to it (diluted fruit juice). Eat  light and bland foods in small amounts as you are able. These foods include bananas, applesauce, rice, lean meats, toast, and crackers. General instructions  Have a responsible adult stay with you for the time you are told. It is important to have someone help care for you until you are awake and alert. If you have sleep apnea, surgery and some medicines can increase your risk for breathing problems. Follow instructions from your health care provider about wearing your sleep device: When you are sleeping. This includes  during daytime naps. While taking prescription pain medicines, sleeping medicines, or medicines that make you drowsy. Do not use any products that contain nicotine or tobacco. These products include cigarettes, chewing tobacco, and vaping devices, such as e-cigarettes. If you need help quitting, ask your health care provider. Contact a health care provider if: You feel nauseous or vomit every time you eat or drink. You feel light-headed. You are still sleepy or having trouble with balance after 24 hours. You get a rash. You have a fever. You have redness or swelling around the IV site. Get help right away if: You have trouble breathing. You have new confusion after you get home. These symptoms may be an emergency. Get help right away. Call 911. Do not wait to see if the symptoms will go away. Do not drive yourself to the hospital. This information is not intended to replace advice given to you by your health care provider. Make sure you discuss any questions you have with your health care provider. Document Revised: 09/11/2021 Document Reviewed: 09/11/2021 Elsevier Patient Education  2024 [employer].

## 2023-06-17 NOTE — Telephone Encounter (Signed)
 [facility] is out until Wednesday. Tobi Bastos, please advise if you can assist? Patient scheduled for procedure Friday and wants this taken care of ASAP since she is supposed to start prep Wednesday.

## 2023-06-18 MED ORDER — CLENPIQ 10-3.5-12 MG-GM -GM/175ML PO SOLN: 1.0000 | ORAL | 0 refills | Status: DC

## 2023-06-18 NOTE — Telephone Encounter (Signed)
 Reviewed and agree with changing to smaller volume prep for compliance purposes.

## 2023-06-18 NOTE — Addendum Note (Signed)
 Addended by: Armstead Peaks on: 06/18/2023 07:39 AM   Modules accepted: Orders

## 2023-06-19 ENCOUNTER — Encounter (HOSPITAL_COMMUNITY)
Admission: RE | Admit: 2023-06-19 | Discharge: 2023-06-19 | Disposition: A | Discharge status: Home / Self Care | Payer: Medicaid Other | Source: Ambulatory Visit | Attending: Internal Medicine | Admitting: Internal Medicine

## 2023-06-19 ENCOUNTER — Other Ambulatory Visit (HOSPITAL_COMMUNITY): Payer: Self-pay | Admitting: Radiology

## 2023-06-19 ENCOUNTER — Encounter (HOSPITAL_COMMUNITY): Payer: Self-pay

## 2023-06-19 VITALS — BP 133/92 | HR 80 | Temp 97.9°F | Resp 18 | Ht 65.0 in | Wt 259.9 lb

## 2023-06-19 DIAGNOSIS — Z8639 Personal history of other endocrine, nutritional and metabolic disease: Secondary | ICD-10-CM | POA: Insufficient documentation

## 2023-06-19 DIAGNOSIS — Z01818 Encounter for other preprocedural examination: Secondary | ICD-10-CM | POA: Diagnosis present

## 2023-06-19 DIAGNOSIS — I10 Essential (primary) hypertension: Secondary | ICD-10-CM | POA: Insufficient documentation

## 2023-06-19 LAB — BASIC METABOLIC PANEL
Anion gap: 9 (ref 5–15)
BUN: 10 mg/dL (ref 6–20)
CO2: 24 mmol/L (ref 22–32)
Calcium: 8.6 mg/dL — ABNORMAL LOW (ref 8.9–10.3)
Chloride: 103 mmol/L (ref 98–111)
Creatinine, Ser: 0.62 mg/dL (ref 0.44–1.00)
GFR, Estimated: >60 mL/min (ref >60)
Glucose, Bld: 88 mg/dL (ref 70–99)
Potassium: 3.6 mmol/L (ref 3.5–5.1)
Sodium: 136 mmol/L (ref 135–145)

## 2023-06-20 ENCOUNTER — Other Ambulatory Visit (HOSPITAL_COMMUNITY): Payer: Self-pay | Admitting: Radiology

## 2023-06-20 ENCOUNTER — Other Ambulatory Visit (HOSPITAL_COMMUNITY): Payer: Self-pay | Admitting: Adult Health

## 2023-06-20 DIAGNOSIS — Z1231 Encounter for screening mammogram for malignant neoplasm of breast: Secondary | ICD-10-CM

## 2023-06-21 ENCOUNTER — Telehealth: Payer: Self-pay

## 2023-06-21 ENCOUNTER — Encounter (HOSPITAL_COMMUNITY): Payer: Self-pay | Admitting: Internal Medicine

## 2023-06-21 ENCOUNTER — Ambulatory Visit (HOSPITAL_COMMUNITY): Payer: Medicaid Other | Admitting: Anesthesiology

## 2023-06-21 ENCOUNTER — Other Ambulatory Visit: Payer: Self-pay

## 2023-06-21 ENCOUNTER — Encounter (HOSPITAL_COMMUNITY)
Admission: RE | Disposition: A | Discharge status: Home / Self Care | Payer: Self-pay | Source: Home / Self Care | Attending: Internal Medicine

## 2023-06-21 ENCOUNTER — Ambulatory Visit (HOSPITAL_COMMUNITY)
Admission: RE | Admit: 2023-06-21 | Discharge: 2023-06-21 | Disposition: A | Discharge status: Home / Self Care | Payer: Medicaid Other | Attending: Internal Medicine | Admitting: Internal Medicine

## 2023-06-21 DIAGNOSIS — K5909 Other constipation: Secondary | ICD-10-CM | POA: Diagnosis not present

## 2023-06-21 DIAGNOSIS — R110 Nausea: Secondary | ICD-10-CM | POA: Insufficient documentation

## 2023-06-21 DIAGNOSIS — K5140 Inflammatory polyps of colon without complications: Secondary | ICD-10-CM | POA: Diagnosis not present

## 2023-06-21 DIAGNOSIS — K219 Gastro-esophageal reflux disease without esophagitis: Secondary | ICD-10-CM | POA: Diagnosis not present

## 2023-06-21 DIAGNOSIS — R1013 Epigastric pain: Secondary | ICD-10-CM | POA: Insufficient documentation

## 2023-06-21 DIAGNOSIS — I10 Essential (primary) hypertension: Secondary | ICD-10-CM | POA: Diagnosis not present

## 2023-06-21 DIAGNOSIS — K625 Hemorrhage of anus and rectum: Secondary | ICD-10-CM

## 2023-06-21 DIAGNOSIS — K3189 Other diseases of stomach and duodenum: Secondary | ICD-10-CM | POA: Insufficient documentation

## 2023-06-21 DIAGNOSIS — R6881 Early satiety: Secondary | ICD-10-CM | POA: Diagnosis not present

## 2023-06-21 DIAGNOSIS — K921 Melena: Secondary | ICD-10-CM | POA: Insufficient documentation

## 2023-06-21 DIAGNOSIS — K5900 Constipation, unspecified: Secondary | ICD-10-CM

## 2023-06-21 HISTORY — PX: POLYPECTOMY: SHX5525

## 2023-06-21 HISTORY — PX: ESOPHAGOGASTRODUODENOSCOPY (EGD) WITH PROPOFOL: SHX5813

## 2023-06-21 HISTORY — PX: BIOPSY: SHX5522

## 2023-06-21 HISTORY — PX: COLONOSCOPY WITH PROPOFOL: SHX5780

## 2023-06-21 SURGERY — COLONOSCOPY WITH PROPOFOL
Anesthesia: General

## 2023-06-21 MED ORDER — STERILE WATER FOR IRRIGATION IR SOLN
Status: DC | PRN
  Administered 2023-06-21: 100 mL

## 2023-06-21 MED ORDER — LIDOCAINE HCL (CARDIAC) PF 100 MG/5ML IV SOSY
PREFILLED_SYRINGE | INTRAVENOUS | Status: DC | PRN
  Administered 2023-06-21: 100 mg via INTRATRACHEAL

## 2023-06-21 MED ORDER — LACTATED RINGERS IV SOLN: INTRAVENOUS | Status: DC

## 2023-06-21 MED ORDER — PROPOFOL 500 MG/50ML IV EMUL
INTRAVENOUS | Status: DC | PRN
  Administered 2023-06-21: 30 mg via INTRAVENOUS
  Administered 2023-06-21: 150 ug/kg/min via INTRAVENOUS
  Administered 2023-06-21: 70 mg via INTRAVENOUS
  Administered 2023-06-21: 30 mg via INTRAVENOUS

## 2023-06-21 NOTE — Op Note (Signed)
 [facility] Patient Name: [name] Procedure Date: 06/21/2023 10:06 AM MRN: [id number] Date of Birth: 1980/09/19 Attending MD: Gennette Pac , MD, [id number] CSN: [id number] Age: 43 Admit Type: Outpatient Procedure:                Colonoscopy Indications:              Hematochezia Providers:                Gennette Pac, MD, Angelica Ran, Elinor Parkinson Referring MD:              Medicines:                Propofol per Anesthesia Complications:            No immediate complications. Estimated Blood Loss:     Estimated blood loss was minimal. Procedure:                Pre-Anesthesia Assessment:                           - Prior to the procedure, a History and Physical                            was performed, and patient medications and                            allergies were reviewed. The patient's tolerance of                            previous anesthesia was also reviewed. The risks                            and benefits of the procedure and the sedation                            options and risks were discussed with the patient.                            All questions were answered, and informed consent                            was obtained. Prior Anticoagulants: The patient has                            taken no anticoagulant or antiplatelet agents. ASA                            Grade Assessment: III - A patient with severe                            systemic disease. After reviewing the risks and                            benefits,  the patient was deemed in satisfactory                            condition to undergo the procedure.                           After obtaining informed consent, the colonoscope                            was passed under direct vision. Throughout the                            procedure, the patient's blood pressure, pulse, and                            oxygen saturations were  monitored continuously. The                            [id number]) scope was introduced through                            the anus and advanced to the the cecum, identified                            by appendiceal orifice and ileocecal valve. Scope In: 10:41:43 AM Scope Out: 10:56:32 AM Scope Withdrawal Time: 0 hours 10 minutes 28 seconds  Total Procedure Duration: 0 hours 14 minutes 49 seconds  Findings:      The perianal and digital rectal examinations were normal.      An 8 mm polyp was found in the ileocecal valve. The polyp was       semi-pedunculated. The polyp was removed with a cold snare. Resection       and retrieval were complete. Estimated blood loss was minimal.      The exam was otherwise without abnormality on direct and retroflexion       views. Impression:               - One 8 mm polyp at the ileocecal valve, removed                            with a cold snare. Resected and retrieved.                           - The examination was otherwise normal on direct                            and retroflexion views. Patient is supposed to be                            on Motegrity. She states the pharmacy did not have                            it Moderate Sedation:      Moderate (conscious) sedation was personally administered by an       anesthesia  professional. The following parameters were monitored: oxygen       saturation, heart rate, blood pressure, respiratory rate, EKG, adequacy       of pulmonary ventilation, and response to care. Recommendation:           - Patient has a contact number available for                            emergencies. The signs and symptoms of potential                            delayed complications were discussed with the                            patient. Return to normal activities tomorrow.                            Written discharge instructions were provided to the                            patient.                            - Advance diet as tolerated. Follow-up on                            pathology. Will research why she did not get                            Motegrity from the pharmacy. Office visit with                            Ermalinda Memos in 6 weeks. See EGD report. Procedure Code(s):        --- Professional ---                           [phone], Colonoscopy, flexible; with removal of                            tumor(s), polyp(s), or other lesion(s) by snare                            technique Diagnosis Code(s):        --- Professional ---                           D12.0, Benign neoplasm of cecum                           K92.1, Melena (includes Hematochezia) CPT copyright 2022 American Medical Association. All rights reserved. The codes documented in this report are preliminary and upon coder review may  be revised to meet current compliance requirements. Gerrit Friends. Ashlley Booher, MD Gennette Pac, MD 06/21/2023 11:10:53 AM This report has been signed electronically. Number of Addenda: 0

## 2023-06-21 NOTE — Discharge Instructions (Signed)
 EGD Discharge instructions Please read the instructions outlined below and refer to this sheet in the next few weeks. These discharge instructions provide you with general information on caring for yourself after you leave the hospital. Your doctor may also give you specific instructions. While your treatment has been planned according to the most current medical practices available, unavoidable complications occasionally occur. If you have any problems or questions after discharge, please call your doctor. ACTIVITY You may resume your regular activity but move at a slower pace for the next 24 hours.  Take frequent rest periods for the next 24 hours.  Walking will help expel (get rid of) the air and reduce the bloated feeling in your abdomen.  No driving for 24 hours (because of the anesthesia (medicine) used during the test).  You may shower.  Do not sign any important legal documents or operate any machinery for 24 hours (because of the anesthesia used during the test).  NUTRITION Drink plenty of fluids.  You may resume your normal diet.  Begin with a light meal and progress to your normal diet.  Avoid alcoholic beverages for 24 hours or as instructed by your caregiver.  MEDICATIONS You may resume your normal medications unless your caregiver tells you otherwise.  WHAT YOU CAN EXPECT TODAY You may experience abdominal discomfort such as a feeling of fullness or "gas" pains.  FOLLOW-UP Your doctor will discuss the results of your test with you.  SEEK IMMEDIATE MEDICAL ATTENTION IF ANY OF THE FOLLOWING OCCUR: Excessive nausea (feeling sick to your stomach) and/or vomiting.  Severe abdominal pain and distention (swelling).  Trouble swallowing.  Temperature over 101 F (37.8 C).  Rectal bleeding or vomiting of blood.    Colonoscopy Discharge Instructions  Read the instructions outlined below and refer to this sheet in the next few weeks. These discharge instructions provide you with  general information on caring for yourself after you leave the hospital. Your doctor may also give you specific instructions. While your treatment has been planned according to the most current medical practices available, unavoidable complications occasionally occur. If you have any problems or questions after discharge, call Dr. Jena Gauss at [phone]. ACTIVITY You may resume your regular activity, but move at a slower pace for the next 24 hours.  Take frequent rest periods for the next 24 hours.  Walking will help get rid of the air and reduce the bloated feeling in your belly (abdomen).  No driving for 24 hours (because of the medicine (anesthesia) used during the test).   Do not sign any important legal documents or operate any machinery for 24 hours (because of the anesthesia used during the test).  NUTRITION Drink plenty of fluids.  You may resume your normal diet as instructed by your doctor.  Begin with a light meal and progress to your normal diet. Heavy or fried foods are harder to digest and may make you feel sick to your stomach (nauseated).  Avoid alcoholic beverages for 24 hours or as instructed.  MEDICATIONS You may resume your normal medications unless your doctor tells you otherwise.  WHAT YOU CAN EXPECT TODAY Some feelings of bloating in the abdomen.  Passage of more gas than usual.  Spotting of blood in your stool or on the toilet paper.  IF YOU HAD POLYPS REMOVED DURING THE COLONOSCOPY: No aspirin products for 7 days or as instructed.  No alcohol for 7 days or as instructed.  Eat a soft diet for the next 24 hours.  FINDING  OUT THE RESULTS OF YOUR TEST Not all test results are available during your visit. If your test results are not back during the visit, make an appointment with your caregiver to find out the results. Do not assume everything is normal if you have not heard from your caregiver or the medical facility. It is important for you to follow up on all of your test  results.  SEEK IMMEDIATE MEDICAL ATTENTION IF: You have more than a spotting of blood in your stool.  Your belly is swollen (abdominal distention).  You are nauseated or vomiting.  You have a temperature over 101.  You have abdominal pain or discomfort that is severe or gets worse throughout the day.      your stomach was inflamed.  Biopsies taken.  You had a polyp in your colon which was removed  Further recommendations to follow pending review of pathology report   office visit with [name] in 6 weeks   at patient request, I called [name] at [phone] findings and recommendations

## 2023-06-21 NOTE — Telephone Encounter (Signed)
 Looks like PA for [employer] was approved and pt was notified of it being approved and that she can go to pharmacy and pick up. Not sure why she didn't go pick it up.   Amanda: please arrange f/u as requested.

## 2023-06-21 NOTE — Telephone Encounter (Signed)
-----   Message from [name] sent at 06/21/2023 11:08 AM EST -----  patient did not get Motegrity.  I do not know why.  EGD and colonoscopy today.  Follow-up appointment with Ermalinda Memos in 6 weeks

## 2023-06-21 NOTE — H&P (Signed)
 @LOGO @   Primary Care Physician:  [facility] Primary Gastroenterologist:  Dr. Jena Gauss  Pre-Procedure History & Physical: HPI:  [name] is a 43 y.o. female here for  further evaluation of upper abdominal pain nausea early satiety via EGD.  Also intermittent rectal bleeding and constipation.  She is here for diagnostic colonoscopy as well.  No dysphagia.  Patient states she did not get Motegrity at the pharmacy.  Linzess only variably effective.  Past Medical History:  Diagnosis Date   AKI (acute kidney injury) (HCC) 08/17/2021   Allergy    banana/pineapple   Anemia    post pregnancy   Anxiety    Asthma    Autoimmune disease, not elsewhere classified(279.49) 2013   Non specific- [facility], abnormal Bone Marrow signal   Breast lump in female 11/28/2012   Has tender round mass at 10-11 0'clock right breast 4 finger breaths from [place] will US   Breast mass, right 08/18/2012   Chlamydia    Chronic breast pain 05/31/2015   Dysmenorrhea 03/23/2014   Dyspareunia 01/05/2014   Elevated prolactin level 02/10/2021   Was 54.5, recheck in December ________   Fibroids, intramural 08/24/2015   GERD (gastroesophageal reflux disease)    Gonorrhea    Headache(784.0)    History of abnormal cervical Pap smear 04/02/2014   Hypertension    Kidney stone    06/2021   LLQ abdominal tenderness 05/31/2015   Menorrhagia 01/05/2014   Neuromuscular disorder (HCC)    lower back and bilat leg pain   Obesity    Other and unspecified ovarian cyst 01/12/2014   Polyclonal gammopathy 07/26/2013   Insignificant   Pyelonephritis 06/28/2021   Recurrent upper respiratory infection (URI)    Scoliosis    Urticaria    Vaginal irritation 09/30/2014   Vaginal Pap smear, abnormal     Past Surgical History:  Procedure Laterality Date   ADENOIDECTOMY     BREAST BIOPSY Left 02/2021   fibroadenoma   CYSTOSCOPY W/ URETERAL STENT PLACEMENT Left 06/29/2021   Procedure: CYSTOSCOPY  WITH RETROGRADE PYELOGRAM/URETERAL STENT PLACEMENT;  Surgeon: Malen Gauze, MD;  Location: [facility];  Service: Urology;  Laterality: Left;   CYSTOSCOPY WITH RETROGRADE PYELOGRAM, URETEROSCOPY AND STENT PLACEMENT Left 07/27/2021   Procedure: CYSTOSCOPY WITH RETROGRADE PYELOGRAM, URETEROSCOPY, STONE EXTRACTION WITH BASKET;  Surgeon: Malen Gauze, MD;  Location: [facility];  Service: Urology;  Laterality: Left;   DILATATION AND CURETTAGE/HYSTEROSCOPY WITH MINERVA N/A 03/14/2021   Procedure: HYSTEROSCOPY WITH MINERVA;  Surgeon: Lazaro Arms, MD;  Location: [facility];  Service: Gynecology;  Laterality: N/A;   DILATION AND CURETTAGE OF UTERUS     LAPAROSCOPIC BILATERAL SALPINGECTOMY Bilateral 03/16/2020   Procedure: LAPAROSCOPIC BILATERAL SALPINGECTOMY;  Surgeon: Lazaro Arms, MD;  Location: [facility];  Service: Gynecology;  Laterality: Bilateral;   SHOULDER SURGERY Right 03/10/2019   TONSILLECTOMY     TUBAL LIGATION N/A    Phreesia 06/28/2020    Prior to Admission medications   Medication Sig Start Date End Date Taking? Authorizing Provider  albuterol (PROVENTIL HFA) 108 (90 Base) MCG/ACT inhaler Inhale 2 puffs into the lungs every 6 (six) hours as needed for wheezing (cough). 09/30/20  Yes Anabel Halon, MD  amLODipine (NORVASC) 5 MG tablet Take 1 tablet (5 mg total) by mouth daily. 06/12/22  Yes Anabel Halon, MD  hydrochlorothiazide (MICROZIDE) 12.5 MG capsule Take 1 capsule (12.5 mg total) by mouth daily. 06/12/22  Yes Anabel Halon, MD  ipratropium (ATROVENT)  0.03 % nasal spray Place 2 sprays into both nostrils every 12 (twelve) hours. 06/16/23  Yes Claiborne Rigg, NP  linaclotide [facility]) 290 MCG CAPS capsule Take 1 capsule (290 mcg total) by mouth daily before breakfast. 05/08/23  Yes Ermalinda Memos S, PA-C  ondansetron (ZOFRAN) 4 MG tablet Take 1 tablet (4 mg total) by mouth every 8 (eight) hours as needed for nausea or vomiting. 05/08/23  Yes Letta Median, PA-C  pantoprazole  (PROTONIX) 40 MG tablet Take 1 tablet (40 mg total) by mouth 2 (two) times daily before a meal. 05/17/23  Yes Harper, Kristen S, PA-C  WEGOVY 0.5 MG/0.5ML SOAJ Inject 0.5 mg into the skin once a week. 01/16/23  Yes [provider]  Blood Pressure Monitoring (BLOOD PRESSURE CUFF) MISC Large cuff. ICD10: I10. Check blood pressure once daily. Please contact office if blood pressure above 150/90. 08/01/21   Anabel Halon, MD  fluticasone-salmeterol (ADVAIR HFA) 115-21 MCG/ACT inhaler Inhale 2 puffs into the lungs 2 (two) times daily. Patient not taking: Reported on 05/17/2023 09/30/20   Anabel Halon, MD  Prucalopride Succinate (MOTEGRITY) 2 MG TABS Take 1 tablet (2 mg total) by mouth daily. 05/17/23   Letta Median, PA-C  rOPINIRole (REQUIP) 1 MG tablet Take 1 tablet (1 mg total) by mouth at bedtime. Patient not taking: Reported on 05/17/2023 05/15/22   Anabel Halon, MD  Sod Picosulfate-Mag Ox-Cit [facility]) 10-3.5-12 MG-GM -GM/175ML SOLN Take 1 kit by mouth as directed. 06/18/23   Corbin Ade, MD    Allergies as of 05/09/2023 - Review Complete 05/08/2023  Allergen Reaction Noted   Banana Rash 12/07/2011   Pineapple Rash 12/07/2011    Family History  Problem Relation Age of Onset   Hypertension Mother    Diabetes Mother    Heart disease Mother    Diabetes Father    Hypertension Maternal Grandmother    Diabetes Son        pre diabetic   Other Son        overactive bladder   COPD Maternal Aunt    Leukemia Maternal Aunt    Cancer Paternal Aunt        breast   Allergic rhinitis Neg Hx    Angioedema Neg Hx    Asthma Neg Hx    Atopy Neg Hx    Eczema Neg Hx    Immunodeficiency Neg Hx    Urticaria Neg Hx    Colon cancer Neg Hx     Social History   Socioeconomic History   Marital status: Divorced    Spouse name: Sales promotion account executive   Number of children: 2   Years of education: Not on file   Highest education level: Not on file  Occupational History   Not on file  Tobacco  Use   Smoking status: Never   Smokeless tobacco: Never  Vaping Use   Vaping status: Never Used  Substance and Sexual Activity   Alcohol use: No   Drug use: No   Sexual activity: Yes    Birth control/protection: Surgical    Comment: tubal & ablation  Other Topics Concern   Not on file  Social History Narrative   Not on file   Social Drivers of Health   Financial Resource Strain: Medium Risk (02/12/2022)   Overall Financial Resource Strain (CARDIA)    Difficulty of Paying Living Expenses: Somewhat hard  Food Insecurity: No Food Insecurity (02/12/2022)   Hunger Vital Sign    Worried About  Running Out of Food in the Last Year: Never true    Ran Out of Food in the Last Year: Never true  Transportation Needs: No Transportation Needs (04/20/2022)   PRAPARE - Administrator, Civil Service (Medical): No    Lack of Transportation (Non-Medical): No  Physical Activity: Insufficiently Active (02/23/2020)   Exercise Vital Sign    Days of Exercise per Week: 2 days    Minutes of Exercise per Session: 10 min  Stress: Stress Concern Present (09/02/2021)   [employer] of Occupational Health - Occupational Stress Questionnaire    Feeling of Stress : Rather much  Social Connections: Unknown (09/01/2021)   Social Connection and Isolation Panel [NHANES]    Frequency of Communication with Friends and Family: More than three times a week    Frequency of Social Gatherings with Friends and Family: Three times a week    Attends Religious Services: 1 to 4 times per year    Active Member of Clubs or Organizations: No    Attends Banker Meetings: Never    Marital Status: Not on file  Intimate Partner Violence: Not At Risk (09/01/2021)   Humiliation, Afraid, Rape, and Kick questionnaire    Fear of Current or Ex-Partner: No    Emotionally Abused: No    Physically Abused: No    Sexually Abused: No    Review of Systems: See HPI, otherwise negative ROS  Physical Exam: BP  (!) 137/93   Pulse 77   Temp 98.3 F (36.8 C) (Oral)   Resp 10   Ht 5\' 5"  (1.651 m)   Wt 117 kg   SpO2 99%   BMI 42.93 kg/m  General:   Alert,  Well-developed, well-nourished, pleasant and cooperative in NAD Skin:  Intact without significant lesions or rashes. Eyes:  Sclera clear, no icterus.   Conjunctiva pink. Ears:  Normal auditory acuity. Nose:  No deformity, discharge,  or lesions. Mouth:  No deformity or lesions. Neck:  Supple; no masses or thyromegaly. No significant cervical adenopathy. Lungs:  Clear throughout to auscultation.   No wheezes, crackles, or rhonchi. No acute distress. Heart:  Regular rate and rhythm; no murmurs, clicks, rubs,  or gallops. Abdomen: Non-distended, normal bowel sounds.  Soft and nontender without appreciable mass or hepatosplenomegaly.  Pulses:  Normal pulses noted. Extremities:  Without clubbing or edema.  Impression/Plan:    Pleasant 43 year old lady with early satiety upper abdominal pain nausea no dysphagia.  Chronic constipation rectal bleeding Linzess only variably effective.  No prior colonoscopy.  Motegrity was to be added to her regimen but she has not started it as of yet.    I will offer the patient a diagnostic EGD and colonoscopy today per plan.  The risks, benefits, limitations, imponderables and alternatives regarding both EGD and colonoscopy have been reviewed with the patient. Questions have been answered. All parties agreeable.       Notice: This dictation was prepared with Dragon dictation along with smaller phrase technology. Any transcriptional errors that result from this process are unintentional and may not be corrected upon review.

## 2023-06-21 NOTE — Anesthesia Preprocedure Evaluation (Signed)
 Anesthesia Evaluation  Patient identified by MRN, date of birth, ID band Patient awake    Reviewed: Allergy & Precautions, H&P , NPO status , Patient's Chart, lab work & pertinent test results, reviewed documented beta blocker date and time   Airway Mallampati: II  TM Distance: >3 FB Neck ROM: full    Dental no notable dental hx.    Pulmonary neg pulmonary ROS, asthma , Recent URI    Pulmonary exam normal breath sounds clear to auscultation       Cardiovascular Exercise Tolerance: Good hypertension, negative cardio ROS  Rhythm:regular Rate:Normal     Neuro/Psych  Headaches  Anxiety      Neuromuscular disease negative neurological ROS  negative psych ROS   GI/Hepatic negative GI ROS, Neg liver ROS,GERD  ,,  Endo/Other  negative endocrine ROS    Renal/GU Renal diseasenegative Renal ROS  negative genitourinary   Musculoskeletal   Abdominal   Peds  Hematology negative hematology ROS (+) Blood dyscrasia, anemia   Anesthesia Other Findings   Reproductive/Obstetrics negative OB ROS                             Anesthesia Physical Anesthesia Plan  ASA: 3  Anesthesia Plan: General   Post-op Pain Management:    Induction:   PONV Risk Score and Plan: Propofol infusion  Airway Management Planned:   Additional Equipment:   Intra-op Plan:   Post-operative Plan:   Informed Consent: I have reviewed the patients History and Physical, chart, labs and discussed the procedure including the risks, benefits and alternatives for the proposed anesthesia with the patient or authorized representative who has indicated his/her understanding and acceptance.     Dental Advisory Given  Plan Discussed with: CRNA  Anesthesia Plan Comments:        Anesthesia Quick Evaluation

## 2023-06-21 NOTE — Op Note (Signed)
 [facility] Patient Name: [name] Procedure Date: 06/21/2023 10:06 AM MRN: [id number] Date of Birth: Aug 19, 1980 Attending MD: Gennette Pac , MD, [id number] CSN: [id number] Age: 43 Admit Type: Outpatient Procedure:                Upper GI endoscopy Indications:              Dyspepsia Providers:                Gennette Pac, MD, Angelica Ran, Elinor Parkinson Referring MD:              Medicines:                Propofol per Anesthesia Complications:            No immediate complications. Estimated Blood Loss:     Estimated blood loss was minimal. Procedure:                Pre-Anesthesia Assessment:                           - Prior to the procedure, a History and Physical                            was performed, and patient medications and                            allergies were reviewed. The patient's tolerance of                            previous anesthesia was also reviewed. The risks                            and benefits of the procedure and the sedation                            options and risks were discussed with the patient.                            All questions were answered, and informed consent                            was obtained. Prior Anticoagulants: The patient has                            taken no anticoagulant or antiplatelet agents. ASA                            Grade Assessment: II - A patient with mild systemic                            disease. After reviewing the risks and benefits,  the patient was deemed in satisfactory condition to                            undergo the procedure.                           After obtaining informed consent, the endoscope was                            passed under direct vision. Throughout the                            procedure, the patient's blood pressure, pulse, and                            oxygen saturations were  monitored continuously. The                            [id number]-[id number] ([id number]) scope was introduced through the                            mouth, and advanced to the second part of duodenum.                            The upper GI endoscopy was accomplished without                            difficulty. The patient tolerated the procedure                            well. Scope In: 10:30:07 AM Scope Out: 10:35:28 AM Total Procedure Duration: 0 hours 5 minutes 21 seconds  Findings:      The examined esophagus was normal.      Striped moderately erythematous mucosa was found in the gastric antrum.       No ulcer or infiltrating process.      The duodenal bulb and second portion of the duodenum were normal.      Gastric mucosa biopsied for histologic study Impression:               - Normal esophagus.                           - Erythematous mucosa in the antrum. Status post                            biopsy                           - Normal duodenal bulb and second portion of the                            duodenum.                           - Moderate Sedation:      Moderate (conscious) sedation was  personally administered by an       anesthesia professional. The following parameters were monitored: oxygen       saturation, heart rate, blood pressure, respiratory rate, EKG, adequacy       of pulmonary ventilation, and response to care. Recommendation:           - Patient has a contact number available for                            emergencies. The signs and symptoms of potential                            delayed complications were discussed with the                            patient. Return to normal activities tomorrow.                            Written discharge instructions were provided to the                            patient.                           - Advance diet as tolerated. Follow-up on                            pathology. See colonoscopy report. Procedure Code(s):         --- Professional ---                           [phone], Esophagogastroduodenoscopy, flexible,                            transoral; diagnostic, including collection of                            specimen(s) by brushing or washing, when performed                            (separate procedure) Diagnosis Code(s):        --- Professional ---                           K31.89, Other diseases of stomach and duodenum                           R10.13, Epigastric pain CPT copyright 2022 American Medical Association. All rights reserved. The codes documented in this report are preliminary and upon coder review may  be revised to meet current compliance requirements. Gerrit Friends. Juanetta Negash, MD Gennette Pac, MD 06/21/2023 11:01:24 AM This report has been signed electronically. Number of Addenda: 0

## 2023-06-21 NOTE — Transfer of Care (Signed)
 Immediate Anesthesia Transfer of Care Note  Patient: [name]  Procedure(s) Performed: COLONOSCOPY WITH PROPOFOL ESOPHAGOGASTRODUODENOSCOPY (EGD) WITH PROPOFOL BIOPSY POLYPECTOMY  Patient Location: PACU and Short Stay  Anesthesia Type:General  Level of Consciousness: awake, alert , and oriented  Airway & Oxygen Therapy: Patient Spontanous Breathing  Post-op Assessment: Report given to RN and Post -op Vital signs reviewed and stable  Post vital signs: Reviewed and stable  Last Vitals:  Vitals Value Taken Time  BP 128/86 06/21/23 1102  Temp 36.4 C 06/21/23 1102  Pulse 95 06/21/23 1102  Resp 18 06/21/23 1102  SpO2 100 % 06/21/23 1102    Last Pain:  Vitals:   06/21/23 1102  TempSrc: Oral  PainSc: 0-No pain      Patients Stated Pain Goal: 4 (06/21/23 0923)  Complications: No notable events documented.

## 2023-06-21 NOTE — Telephone Encounter (Signed)
 Can we find out if she attempted to pick up Motegrity? Per Dr. Luvenia Starch colonoscopy report, the pharmacy didn't have the medication.  Not sure if this is truly the case. If so, I can send in another Rx.

## 2023-06-22 DIAGNOSIS — D120 Benign neoplasm of cecum: Secondary | ICD-10-CM

## 2023-06-22 DIAGNOSIS — I10 Essential (primary) hypertension: Secondary | ICD-10-CM

## 2023-06-22 DIAGNOSIS — J45909 Unspecified asthma, uncomplicated: Secondary | ICD-10-CM | POA: Diagnosis not present

## 2023-06-22 DIAGNOSIS — K3189 Other diseases of stomach and duodenum: Secondary | ICD-10-CM

## 2023-06-22 NOTE — Anesthesia Postprocedure Evaluation (Signed)
 Anesthesia Post Note  Patient: [name]  Procedure(s) Performed: COLONOSCOPY WITH PROPOFOL ESOPHAGOGASTRODUODENOSCOPY (EGD) WITH PROPOFOL BIOPSY POLYPECTOMY  Patient location during evaluation: Phase II Anesthesia Type: General Level of consciousness: awake Pain management: pain level controlled Vital Signs Assessment: post-procedure vital signs reviewed and stable Respiratory status: spontaneous breathing and respiratory function stable Cardiovascular status: blood pressure returned to baseline and stable Postop Assessment: no headache and no apparent nausea or vomiting Anesthetic complications: no Comments: Late entry   No notable events documented.   Last Vitals:  Vitals:   06/21/23 0923 06/21/23 1102  BP: (!) 137/93 128/86  Pulse: 77 95  Resp: 10 18  Temp: 36.8 C 36.4 C  SpO2: 99% 100%    Last Pain:  Vitals:   06/21/23 1102  TempSrc: Oral  PainSc: 0-No pain                 Windell Norfolk

## 2023-06-24 ENCOUNTER — Other Ambulatory Visit: Payer: Self-pay | Admitting: Gastroenterology

## 2023-06-24 ENCOUNTER — Encounter (HOSPITAL_COMMUNITY): Payer: Medicaid Other

## 2023-06-24 ENCOUNTER — Encounter (HOSPITAL_COMMUNITY): Payer: Self-pay | Admitting: Internal Medicine

## 2023-06-24 ENCOUNTER — Ambulatory Visit (HOSPITAL_COMMUNITY)
Admission: RE | Admit: 2023-06-24 | Discharge: 2023-06-24 | Disposition: A | Discharge status: Home / Self Care | Payer: Medicaid Other | Source: Ambulatory Visit | Attending: Adult Health | Admitting: Adult Health

## 2023-06-24 DIAGNOSIS — Z1231 Encounter for screening mammogram for malignant neoplasm of breast: Secondary | ICD-10-CM | POA: Diagnosis present

## 2023-06-24 DIAGNOSIS — K5904 Chronic idiopathic constipation: Secondary | ICD-10-CM

## 2023-06-24 LAB — SURGICAL PATHOLOGY

## 2023-06-24 MED ORDER — PRUCALOPRIDE SUCCINATE 2 MG PO TABS: 2.0000 mg | ORAL_TABLET | Freq: Every day | ORAL | 3 refills | Status: AC

## 2023-06-24 NOTE — Telephone Encounter (Signed)
 Pt states that she checked the pharmacy again today and they stated that it was never received. Pt would like for you to resend it.

## 2023-06-24 NOTE — Telephone Encounter (Signed)
 Rx sent to [facility]

## 2023-07-07 ENCOUNTER — Encounter: Payer: Self-pay | Admitting: Internal Medicine

## 2023-07-17 ENCOUNTER — Telehealth: Admitting: Physician Assistant

## 2023-07-17 DIAGNOSIS — B3731 Acute candidiasis of vulva and vagina: Secondary | ICD-10-CM | POA: Diagnosis not present

## 2023-07-17 MED ORDER — FLUCONAZOLE 150 MG PO TABS: ORAL_TABLET | ORAL | 0 refills | Status: DC

## 2023-07-17 NOTE — Progress Notes (Signed)

## 2023-07-17 NOTE — Progress Notes (Signed)
 I have spent 5 minutes in review of e-visit questionnaire, review and updating patient chart, medical decision making and response to patient.   Piedad Climes, PA-C

## 2023-08-01 NOTE — Progress Notes (Unsigned)
 Mcg dia   Referring Provider: Center, [facility] Primary Care Physician:  Center, [facility] Primary GI Physician: Dr. Jena Gauss  Chief Complaint  Patient presents with   Follow-up    Follow up. No problems     HPI:   [name] is a 43 y.o. female presenting today with a history of anxiety, asthma, HTN, constipation, GERD, presenting today for folow-up of abdominal pain, nausea, GERD, and constipation.   Patient previously noted worsening constipation, nausea, and GERD after starting Wegovy. She was started in Linzess 290 mcg and pantoprazole 40 mg daily on 05/08/23. She was also scheduled for a colonoscopy to evaluate new onset rectal bleeding.   She returned to the office 1/17 reporting abdominal tenderness x 1 week as well as excessive gas, early satiety. Nausea controlled with Zofran. GERD controlled with pantoprazole. Constipation not responding to Linzess. Plan included labs, stop Linzess and start Motegrity, daily probiotic, lactose free diet.   Labs showed no significant abnormalities.  Recommended increasing pantoprazole to 40 mg twice daily and adding EGD to upcoming colonoscopy.   Colonoscopy 06/21/23: - One 8 mm polyp at the ileocecal valve, removed with a cold snare. Resected and retrieved. - The examination was otherwise normal on direct and retroflexion views. - pathology with Juvenile/inflammatory polyp.  - Recommended repeat in 10 years.   EGD 06/21/23: - Normal esophagus. - Erythematous mucosa in the antrum. Status post biopsy - Normal duodenal bulb and second portion of the duodenum. - Pathology with moderate nonspecific reactive gastropathy, no H pylori.     Today:  Continues to struggle with constipation.  She is taking Motegrity daily and will have about 1 bowel movement per week.  If she feels like she really needs to have a bowel movement, she will take Linzess 290 mcg, but she has to stay at home because this will make her go to the bathroom  quite a bit.  Reports when she is able to have a bowel movement, she feels much better overall.  Her abdominal pain has improved since I saw her in the office last.  States it comes and goes at random.  No specific trigger.  Not related to meals.    Occasional nausea which does seem to be worse when she is constipated.  No vomiting.  Needs a refill on Zofran.  GERD is well-controlled on pantoprazole twice daily.  Denies BRBPR or melena.  States she did stop [place] for 2 months and had no real change in her GI symptoms.  She just started back about 1 week ago.  Past Medical History:  Diagnosis Date   AKI (acute kidney injury) (HCC) 08/17/2021   Allergy    banana/pineapple   Anemia    post pregnancy   Anxiety    Asthma    Autoimmune disease, not elsewhere classified(279.49) 2013   Non specific- [facility], abnormal Bone Marrow signal   Breast lump in female 11/28/2012   Has tender round mass at 10-11 0'clock right breast 4 finger breaths from [place] will US   Breast mass, right 08/18/2012   Chlamydia    Chronic breast pain 05/31/2015   Dysmenorrhea 03/23/2014   Dyspareunia 01/05/2014   Elevated prolactin level 02/10/2021   Was 54.5, recheck in December ________   Fibroids, intramural 08/24/2015   GERD (gastroesophageal reflux disease)    Gonorrhea    Headache(784.0)    History of abnormal cervical Pap smear 04/02/2014   Hypertension    Kidney stone    06/2021  LLQ abdominal tenderness 05/31/2015   Menorrhagia 01/05/2014   Neuromuscular disorder (HCC)    lower back and bilat leg pain   Obesity    Other and unspecified ovarian cyst 01/12/2014   Polyclonal gammopathy 07/26/2013   Insignificant   Pyelonephritis 06/28/2021   Recurrent upper respiratory infection (URI)    Scoliosis    Urticaria    Vaginal irritation 09/30/2014   Vaginal Pap smear, abnormal     Past Surgical History:  Procedure Laterality Date   ADENOIDECTOMY     BIOPSY  06/21/2023   Procedure:  BIOPSY;  Surgeon: Corbin Ade, MD;  [facility];  Service: Endoscopy;;   BREAST BIOPSY Left 02/2021   fibroadenoma   COLONOSCOPY WITH PROPOFOL N/A 06/21/2023   Procedure: COLONOSCOPY WITH PROPOFOL;  Surgeon: Corbin Ade, MD;  [facility];  Service: Endoscopy;  Laterality: N/A;  10:00 am, asa 3   CYSTOSCOPY W/ URETERAL STENT PLACEMENT Left 06/29/2021   Procedure: CYSTOSCOPY WITH RETROGRADE PYELOGRAM/URETERAL STENT PLACEMENT;  Surgeon: Malen Gauze, MD;  Location: [facility];  Service: Urology;  Laterality: Left;   CYSTOSCOPY WITH RETROGRADE PYELOGRAM, URETEROSCOPY AND STENT PLACEMENT Left 07/27/2021   Procedure: CYSTOSCOPY WITH RETROGRADE PYELOGRAM, URETEROSCOPY, STONE EXTRACTION WITH BASKET;  Surgeon: Malen Gauze, MD;  Location: [facility];  Service: Urology;  Laterality: Left;   DILATATION AND CURETTAGE/HYSTEROSCOPY WITH MINERVA N/A 03/14/2021   Procedure: HYSTEROSCOPY WITH MINERVA;  Surgeon: Lazaro Arms, MD;  Location: [facility];  Service: Gynecology;  Laterality: N/A;   DILATION AND CURETTAGE OF UTERUS     ESOPHAGOGASTRODUODENOSCOPY (EGD) WITH PROPOFOL N/A 06/21/2023   Procedure: ESOPHAGOGASTRODUODENOSCOPY (EGD) WITH PROPOFOL;  Surgeon: Corbin Ade, MD;  [facility];  Service: Endoscopy;  Laterality: N/A;   LAPAROSCOPIC BILATERAL SALPINGECTOMY Bilateral 03/16/2020   Procedure: LAPAROSCOPIC BILATERAL SALPINGECTOMY;  Surgeon: Lazaro Arms, MD;  Location: [facility];  Service: Gynecology;  Laterality: Bilateral;   POLYPECTOMY  06/21/2023   Procedure: POLYPECTOMY;  Surgeon: Corbin Ade, MD;  [facility];  Service: Endoscopy;;   SHOULDER SURGERY Right 03/10/2019   TONSILLECTOMY     TUBAL LIGATION N/A    [employer]reesia 06/28/2020    Current Outpatient Medications  Medication Sig Dispense Refill   albuterol (PROVENTIL HFA) 108 (90 Base) MCG/ACT inhaler Inhale 2 puffs into the lungs every 6 (six) hours as needed for wheezing (cough). 8  g 5   amLODipine (NORVASC) 5 MG tablet Take 1 tablet (5 mg total) by mouth daily. 90 tablet 1   Blood Pressure Monitoring (BLOOD PRESSURE CUFF) MISC Large cuff. ICD10: I10. Check blood pressure once daily. Please contact office if blood pressure above 150/90. 1 each 0   fluticasone-salmeterol (ADVAIR HFA) 115-21 MCG/ACT inhaler Inhale 2 puffs into the lungs 2 (two) times daily. 1 each 12   hydrochlorothiazide (MICROZIDE) 12.5 MG capsule Take 1 capsule (12.5 mg total) by mouth daily. 90 capsule 1   ipratropium (ATROVENT) 0.03 % nasal spray Place 2 sprays into both nostrils every 12 (twelve) hours. 30 mL 12   linaclotide (LINZESS) 290 MCG CAPS capsule Take 1 capsule (290 mcg total) by mouth daily before breakfast. 30 capsule 5   pantoprazole (PROTONIX) 40 MG tablet Take 1 tablet (40 mg total) by mouth 2 (two) times daily before a meal. 60 tablet 3   Prucalopride Succinate (MOTEGRITY) 2 MG TABS Take 1 tablet (2 mg total) by mouth daily. 30 tablet 3   WEGOVY 0.5 MG/0.5ML SOAJ Inject 0.5  mg into the skin once a week.     ondansetron (ZOFRAN) 4 MG tablet Take 1 tablet (4 mg total) by mouth every 8 (eight) hours as needed for nausea or vomiting. 30 tablet 0   No current facility-administered medications for this visit.    Allergies as of 08/02/2023 - Review Complete 08/02/2023  Allergen Reaction Noted   Banana Rash 12/07/2011   Pineapple Rash 12/07/2011    Family History  Problem Relation Age of Onset   Hypertension Mother    Diabetes Mother    Heart disease Mother    Diabetes Father    Hypertension Maternal Grandmother    Diabetes Son        pre diabetic   Other Son        overactive bladder   COPD Maternal Aunt    Leukemia Maternal Aunt    Cancer Paternal Aunt        breast   Allergic rhinitis Neg Hx    Angioedema Neg Hx    Asthma Neg Hx    Atopy Neg Hx    Eczema Neg Hx    Immunodeficiency Neg Hx    Urticaria Neg Hx    Colon cancer Neg Hx     Social History   Socioeconomic  History   Marital status: Divorced    Spouse name: Sales promotion account executive   Number of children: 2   Years of education: Not on file   Highest education level: Not on file  Occupational History   Not on file  Tobacco Use   Smoking status: Never   Smokeless tobacco: Never  Vaping Use   Vaping status: Never Used  Substance and Sexual Activity   Alcohol use: No   Drug use: No   Sexual activity: Yes    Birth control/protection: Surgical    Comment: tubal & ablation  Other Topics Concern   Not on file  Social History Narrative   Not on file   Social Drivers of Health   Financial Resource Strain: Medium Risk (02/12/2022)   Overall Financial Resource Strain (CARDIA)    Difficulty of Paying Living Expenses: Somewhat hard  Food Insecurity: No Food Insecurity (02/12/2022)   Hunger Vital Sign    Worried About Running Out of Food in the Last Year: Never true    Ran Out of Food in the Last Year: Never true  Transportation Needs: No Transportation Needs (04/20/2022)   PRAPARE - Administrator, Civil Service (Medical): No    Lack of Transportation (Non-Medical): No  Physical Activity: Insufficiently Active (02/23/2020)   Exercise Vital Sign    Days of Exercise per Week: 2 days    Minutes of Exercise per Session: 10 min  Stress: Stress Concern Present (09/02/2021)   [employer] of Occupational Health - Occupational Stress Questionnaire    Feeling of Stress : Rather much  Social Connections: Unknown (09/01/2021)   Social Connection and Isolation Panel [NHANES]    Frequency of Communication with Friends and Family: More than three times a week    Frequency of Social Gatherings with Friends and Family: Three times a week    Attends Religious Services: 1 to 4 times per year    Active Member of Clubs or Organizations: No    Attends Banker Meetings: Never    Marital Status: Not on file    Review of Systems: Gen: Denies fever, chills, cold or flulike symptoms, presyncope,  syncope. GI: See HPI Heme: See HPI  Physical Exam: BP  119/78 (BP Location: Right Arm, Patient Position: Sitting, Cuff Size: Large)   Pulse 91   Temp 97.6 F (36.4 C) (Temporal)   Ht 5\' 5"  (1.651 m)   Wt 276 lb 9.6 oz (125.5 kg)   BMI 46.03 kg/m  General:   Alert and oriented. No distress noted. Pleasant and cooperative.  Head:  Normocephalic and atraumatic. Eyes:  Conjuctiva clear without scleral icterus. Abdomen:  +BS, soft, and non-distended. Mild TTP in suprapubic area, LLQ, LUQ, and RUQ. No rebound or guarding. No HSM or masses noted. Msk:  Symmetrical without gross deformities. Normal posture. Neurologic:  Alert and  oriented x4 Psych:  Normal mood and affect.    Assessment:  43 year old female with history of anxiety, asthma, HTN, constipation, GERD, presenting today for follow-up of constipation, GERD, abdominal pain, nausea.  Constipation: Chronic.  Possibly IBS-C versus CIC. Initially felt Reginal Lutes was worsening symptoms, but patient reports stopping Wegovy for 2 months with no change in symptoms.  Linzess 290 mcg alone did not provide adequate response.  Now on Motegrity 2 mg daily, still only having about 1 bowel movement per week.  States that she takes Linzess with Motegrity, she will have numerous bowel movements.  I suspect Linzess and Motegrity are working synergistically and she would likely benefit from taking combination therapy but with a lower dose of Linzess.  I will give her samples of Linzess 145 mcg to try over the weekend and requested that she call next week with a progress report.  Notably, she did just have a colonoscopy 06/21/2023 and was found to have an 8 mm benign polyp at her ileocecal valve with recommendations for 10-year repeat.  Generalized abdominal pain: New onset in January.  Overall, symptoms have improved, but continues to have intermittent recurrence which she reports is at random.  Query whether this is related to uncontrolled constipation.   Recent EGD and colonoscopy without significant abnormalities.  CBC, CMP, lipase updated in January with no significant abnormalities.  For now, I recommended that we focus on management of constipation and if symptoms continue, we can pursue additional workup.  Nausea without vomiting: Intermittent.  Likely related to uncontrolled constipation and possibly Wegovy.  She will use Zofran as needed for now.  I am hopeful that we will be able to eliminate the need for Zofran with better management of constipation as discussed above.   GERD:  Doing well on pantoprazole 40 mg twice daily.  Will consider decreasing pantoprazole to once daily in the near future.   Plan:  Continue Motegrity 2 mg daily. Decrease Linzess to 145 mcg daily.  Samples provided.  Requested progress report next week. Continue pantoprazole 40 mg twice daily for now. Zofran as needed. Follow-up in 6 weeks.   Ermalinda Memos, PA-C [facility] 08/02/2023

## 2023-08-02 ENCOUNTER — Ambulatory Visit: Payer: Medicaid Other | Admitting: Gastroenterology

## 2023-08-02 ENCOUNTER — Encounter: Payer: Self-pay | Admitting: Gastroenterology

## 2023-08-02 VITALS — BP 119/78 | HR 91 | Temp 97.6°F | Ht 65.0 in | Wt 276.6 lb

## 2023-08-02 DIAGNOSIS — R1084 Generalized abdominal pain: Secondary | ICD-10-CM

## 2023-08-02 DIAGNOSIS — K5909 Other constipation: Secondary | ICD-10-CM | POA: Diagnosis not present

## 2023-08-02 DIAGNOSIS — R110 Nausea: Secondary | ICD-10-CM | POA: Diagnosis not present

## 2023-08-02 DIAGNOSIS — K219 Gastro-esophageal reflux disease without esophagitis: Secondary | ICD-10-CM | POA: Diagnosis not present

## 2023-08-02 DIAGNOSIS — K5904 Chronic idiopathic constipation: Secondary | ICD-10-CM

## 2023-08-02 MED ORDER — ONDANSETRON HCL 4 MG PO TABS: 4.0000 mg | ORAL_TABLET | Freq: Three times a day (TID) | ORAL | 0 refills | Status: AC | PRN

## 2023-08-02 NOTE — Patient Instructions (Addendum)
 Continue Motegrity 2 mg daily.  Lets try decreasing the dose of Linzess to 145 mcg in hopes that you will be able to take Linzess every day with Motegrity to allow you to have good, productive bowel movements on a daily basis.  We are going to give you samples today.  Please call or send a MyChart message next week to let me know how this works for you.   Continue pantoprazole 40 mg twice daily 30 minutes before breakfast and dinner.  Continue Zofran as needed.  I have sent a refill to your pharmacy.  I will plan to see you back in the office in 6 weeks or sooner if needed.  Ermalinda Memos, PA-C [facility]

## 2023-08-06 ENCOUNTER — Telehealth: Admitting: Physician Assistant

## 2023-08-06 DIAGNOSIS — B3731 Acute candidiasis of vulva and vagina: Secondary | ICD-10-CM

## 2023-08-07 MED ORDER — FLUCONAZOLE 150 MG PO TABS
150.0000 mg | ORAL_TABLET | ORAL | 0 refills | Status: DC | PRN
Start: 2023-08-07 — End: 2023-09-13

## 2023-08-07 NOTE — Progress Notes (Signed)

## 2023-08-26 ENCOUNTER — Telehealth: Admitting: Nurse Practitioner

## 2023-08-26 ENCOUNTER — Ambulatory Visit
Admission: EM | Admit: 2023-08-26 | Discharge: 2023-08-26 | Disposition: A | Discharge status: Home / Self Care | Attending: Family Medicine | Admitting: Family Medicine

## 2023-08-26 DIAGNOSIS — N898 Other specified noninflammatory disorders of vagina: Secondary | ICD-10-CM

## 2023-08-26 DIAGNOSIS — S20229A Contusion of unspecified back wall of thorax, initial encounter: Secondary | ICD-10-CM | POA: Diagnosis not present

## 2023-08-26 MED ORDER — DICLOFENAC SODIUM 1 % EX GEL: 2.0000 g | Freq: Four times a day (QID) | CUTANEOUS | 0 refills | Status: DC

## 2023-08-26 MED ORDER — TIZANIDINE HCL 4 MG PO CAPS: 4.0000 mg | ORAL_CAPSULE | Freq: Three times a day (TID) | ORAL | 0 refills | Status: DC | PRN

## 2023-08-26 NOTE — Progress Notes (Signed)
 Because you have been treated for this twice in the past month via Evisit, I feel your condition warrants further evaluation and I recommend that you be seen for a face to face visit.  Please contact your primary care physician practice to be seen. Many offices offer virtual options to be seen via video if you are not comfortable going in person to a medical facility at this time.  NOTE: You will NOT be charged for this eVisit.  If you do not have a PCP, [facility] offers a free physician referral service available at [phone]. Our trained staff has the experience, knowledge and resources to put you in touch with a physician who is right for you.    If you are having a true medical emergency please call 911.   Your e-visit answers were reviewed by a board certified advanced clinical practitioner to complete your personal care plan.  Thank you for using e-Visits.

## 2023-08-26 NOTE — Discharge Instructions (Addendum)
 In addition to the prescribed medications, you may use heat, stretches, lidocaine  patches, muscle rubs, rest.  Follow-up for significantly worsening symptoms.

## 2023-08-26 NOTE — ED Triage Notes (Signed)
 Pt states she was putting something in her trunk of car and the trunk door fell down on her back on Saturday. Taking tylenol .

## 2023-08-27 NOTE — ED Provider Notes (Signed)
 [facility]    CSN: [id number] Arrival date & time: 08/26/23  4098      History   Chief Complaint Chief Complaint  Patient presents with   Back Pain    HPI [name] is a 43 y.o. female.   Patient presenting today with several day history of bilateral mid back pain after the trunk of her car shot onto her back while she was reaching in it to grab something.  She denies decreased range of motion, radiation of pain down legs, numbness, tingling, weakness, bruising or swelling.  So far trying over-the-counter remedies with minimal relief.    Past Medical History:  Diagnosis Date   AKI (acute kidney injury) (HCC) 08/17/2021   Allergy     banana/pineapple   Anemia    post pregnancy   Anxiety    Asthma    Autoimmune disease, not elsewhere classified(279.49) 2013   Non specific- [facility], abnormal Bone Marrow signal   Breast lump in female 11/28/2012   Has tender round mass at 10-11 0'clock right breast 4 finger breaths from areola will US    Breast mass, right 08/18/2012   Chlamydia    Chronic breast pain 05/31/2015   Dysmenorrhea 03/23/2014   Dyspareunia 01/05/2014   Elevated prolactin level 02/10/2021   Was 54.5, recheck in December ________   Fibroids, intramural 08/24/2015   GERD (gastroesophageal reflux disease)    Gonorrhea    Headache(784.0)    History of abnormal cervical Pap smear 04/02/2014   Hypertension    Kidney stone    06/2021   LLQ abdominal tenderness 05/31/2015   Menorrhagia 01/05/2014   Neuromuscular disorder (HCC)    lower back and bilat leg pain   Obesity    Other and unspecified ovarian cyst 01/12/2014   Polyclonal gammopathy 07/26/2013   Insignificant   Pyelonephritis 06/28/2021   Recurrent upper respiratory infection (URI)    Scoliosis    Urticaria    Vaginal irritation 09/30/2014   Vaginal Pap smear, abnormal     Patient Active Problem List   Diagnosis Date Noted   Irritable bowel syndrome with  constipation 05/08/2023   Rectal bleeding 05/08/2023   Mixed hyperlipidemia 05/16/2022   Mass of upper outer quadrant of left breast 11/15/2021   Intractable migraine without aura and without status migrainosus 09/14/2021   Urge incontinence 09/14/2021   Hospital discharge follow-up 07/14/2021   Hydronephrosis 06/28/2021   Fibroadenoma of breast, left 03/31/2021   Restless legs 02/10/2021   Vitamin D  deficiency 09/30/2020   Menorrhagia with irregular cycle 08/17/2020   Chronic right shoulder pain 07/01/2020   Mild persistent asthma, uncomplicated 02/12/2018   Abnormal uterine bleeding (AUB) 03/12/2017   Intramural leiomyoma of uterus 08/24/2015   Polyclonal gammopathy 07/26/2013   Dysplasia of cervix 03/31/2013   Primary insomnia 12/09/2011   Hypertension 09/21/2010   Morbid obesity (HCC) 11/03/2007    Past Surgical History:  Procedure Laterality Date   ADENOIDECTOMY     BIOPSY  06/21/2023   Procedure: BIOPSY;  Surgeon: Suzette Espy, MD;  [facility];  Service: Endoscopy;;   BREAST BIOPSY Left 02/2021   fibroadenoma   COLONOSCOPY WITH PROPOFOL  N/A 06/21/2023   Procedure: COLONOSCOPY WITH PROPOFOL ;  Surgeon: Suzette Espy, MD;  [facility];  Service: Endoscopy;  Laterality: N/A;  10:00 am, asa 3   CYSTOSCOPY W/ URETERAL STENT PLACEMENT Left 06/29/2021   Procedure: CYSTOSCOPY WITH RETROGRADE PYELOGRAM/URETERAL STENT PLACEMENT;  Surgeon: Marco Severs, MD;  Location:  [facility];  Service: Urology;  Laterality: Left;   CYSTOSCOPY WITH RETROGRADE PYELOGRAM, URETEROSCOPY AND STENT PLACEMENT Left 07/27/2021   Procedure: CYSTOSCOPY WITH RETROGRADE PYELOGRAM, URETEROSCOPY, STONE EXTRACTION WITH BASKET;  Surgeon: Marco Severs, MD;  Location: [facility];  Service: Urology;  Laterality: Left;   DILATATION AND CURETTAGE/HYSTEROSCOPY WITH MINERVA N/A 03/14/2021   Procedure: HYSTEROSCOPY WITH MINERVA;  Surgeon: Wendelyn Halter, MD;  Location: [facility];  Service:  Gynecology;  Laterality: N/A;   DILATION AND CURETTAGE OF UTERUS     ESOPHAGOGASTRODUODENOSCOPY (EGD) WITH PROPOFOL  N/A 06/21/2023   Procedure: ESOPHAGOGASTRODUODENOSCOPY (EGD) WITH PROPOFOL ;  Surgeon: Suzette Espy, MD;  [facility];  Service: Endoscopy;  Laterality: N/A;   LAPAROSCOPIC BILATERAL SALPINGECTOMY Bilateral 03/16/2020   Procedure: LAPAROSCOPIC BILATERAL SALPINGECTOMY;  Surgeon: Wendelyn Halter, MD;  Location: [facility];  Service: Gynecology;  Laterality: Bilateral;   POLYPECTOMY  06/21/2023   Procedure: POLYPECTOMY;  Surgeon: Suzette Espy, MD;  [facility];  Service: Endoscopy;;   SHOULDER SURGERY Right 03/10/2019   TONSILLECTOMY     TUBAL LIGATION N/A    Phreesia 06/28/2020    OB History     Gravida  2   Para  2   Term  2   Preterm      AB  0   Living  2      SAB  0   IAB      Ectopic      Multiple  0   Live Births  2            Home Medications    Prior to Admission medications   Medication Sig Start Date End Date Taking? Authorizing Provider  amLODipine  (NORVASC ) 5 MG tablet Take 1 tablet (5 mg total) by mouth daily. 06/12/22  Yes Meldon Sport, MD  diclofenac  Sodium (VOLTAREN ) 1 % GEL Apply 2 g topically 4 (four) times daily. 08/26/23  Yes Corbin Dess, PA-C  hydrochlorothiazide  (MICROZIDE ) 12.5 MG capsule Take 1 capsule (12.5 mg total) by mouth daily. 06/12/22  Yes Meldon Sport, MD  linaclotide  (LINZESS ) 290 MCG CAPS capsule Take 1 capsule (290 mcg total) by mouth daily before breakfast. 05/08/23  Yes Shana Daring S, PA-C  Prucalopride Succinate  (MOTEGRITY ) 2 MG TABS Take 1 tablet (2 mg total) by mouth daily. 06/24/23  Yes Shana Daring S, PA-C  tiZANidine (ZANAFLEX) 4 MG capsule Take 1 capsule (4 mg total) by mouth 3 (three) times daily as needed for muscle spasms. Do not drink alcohol or drive while taking this medication. May cause drowsiness 08/26/23  Yes Corbin Dess, PA-C  WEGOVY 0.5  MG/0.5ML SOAJ Inject 0.5 mg into the skin once a week. 01/16/23  Yes [provider]  albuterol  (PROVENTIL  HFA) 108 (90 Base) MCG/ACT inhaler Inhale 2 puffs into the lungs every 6 (six) hours as needed for wheezing (cough). 09/30/20   Meldon Sport, MD  Blood Pressure Monitoring (BLOOD PRESSURE CUFF) MISC Large cuff. ICD10: I10. Check blood pressure once daily. Please contact office if blood pressure above 150/90. 08/01/21   Meldon Sport, MD  fluconazole  (DIFLUCAN ) 150 MG tablet Take 1 tablet (150 mg total) by mouth every 3 (three) days as needed. 08/07/23   Angelia Kelp, PA-C  fluticasone -salmeterol (ADVAIR  HFA) 115-21 MCG/ACT inhaler Inhale 2 puffs into the lungs 2 (two) times daily. 09/30/20   Patel, Rutwik K, MD  ipratropium (ATROVENT ) 0.03 % nasal spray Place 2 sprays into both nostrils every  12 (twelve) hours. 06/16/23   Fleming, Zelda W, NP  ondansetron  (ZOFRAN ) 4 MG tablet Take 1 tablet (4 mg total) by mouth every 8 (eight) hours as needed for nausea or vomiting. 08/02/23   Evander Hills, PA-C  pantoprazole  (PROTONIX ) 40 MG tablet Take 1 tablet (40 mg total) by mouth 2 (two) times daily before a meal. 05/17/23   Evander Hills, PA-C    Family History Family History  Problem Relation Age of Onset   Hypertension Mother    Diabetes Mother    Heart disease Mother    Diabetes Father    Hypertension Maternal Grandmother    Diabetes Son        pre diabetic   Other Son        overactive bladder   COPD Maternal Aunt    Leukemia Maternal Aunt    Cancer Paternal Aunt        breast   Allergic rhinitis Neg Hx    Angioedema Neg Hx    Asthma Neg Hx    Atopy Neg Hx    Eczema Neg Hx    Immunodeficiency Neg Hx    Urticaria Neg Hx    Colon cancer Neg Hx     Social History Social History   Tobacco Use   Smoking status: Never   Smokeless tobacco: Never  Vaping Use   Vaping status: Never Used  Substance Use Topics   Alcohol use: No   Drug use: No     Allergies    Banana and Pineapple  Review of Systems Review of Systems Per HPI  Physical Exam Triage Vital Signs ED Triage Vitals  Encounter Vitals Group     BP 08/26/23 0824 (!) 148/97     Systolic BP Percentile --      Diastolic BP Percentile --      Pulse Rate 08/26/23 0824 70     Resp 08/26/23 0824 16     Temp 08/26/23 0824 98.3 F (36.8 C)     Temp Source 08/26/23 0824 Oral     SpO2 08/26/23 0824 97 %     Weight --      Height --      Head Circumference --      Peak Flow --      Pain Score 08/26/23 0825 8     Pain Loc --      Pain Education --      Exclude from Growth Chart --    No data found.  Updated Vital Signs BP (!) 148/97 (BP Location: Right Arm)   Pulse 70   Temp 98.3 F (36.8 C) (Oral)   Resp 16   SpO2 97%   Visual Acuity Right Eye Distance:   Left Eye Distance:   Bilateral Distance:    Right Eye Near:   Left Eye Near:    Bilateral Near:     Physical Exam Vitals and nursing note reviewed.  Constitutional:      Appearance: Normal appearance. She is not ill-appearing.  HENT:     Head: Atraumatic.  Eyes:     Extraocular Movements: Extraocular movements intact.     Conjunctiva/sclera: Conjunctivae normal.  Cardiovascular:     Rate and Rhythm: Normal rate and regular rhythm.     Heart sounds: Normal heart sounds.  Pulmonary:     Effort: Pulmonary effort is normal.     Breath sounds: Normal breath sounds.  Musculoskeletal:        General: Tenderness present. Normal  range of motion.     Cervical back: Normal range of motion and neck supple.     Comments: No midline spinal tenderness to palpation diffusely.  Normal gait and range of motion.  No appreciable edema, discoloration.  Diffuse tenderness to palpation across the mid back bilaterally in the area of impact  Skin:    General: Skin is warm and dry.  Neurological:     Mental Status: She is alert and oriented to person, place, and time.     Motor: No weakness.     Gait: Gait normal.     Comments:  All 4 extremities neurovascularly intact  Psychiatric:        Mood and Affect: Mood normal.        Thought Content: Thought content normal.        Judgment: Judgment normal.      UC Treatments / Results  Labs (all labs ordered are listed, but only abnormal results are displayed) Labs Reviewed - No data to display  EKG   Radiology No results found.  Procedures Procedures (including critical care time)  Medications Ordered in UC Medications - No data to display  Initial Impression / Assessment and Plan / UC Course  I have reviewed the triage vital signs and the nursing notes.  Pertinent labs & imaging results that were available during my care of the patient were reviewed by me and considered in my medical decision making (see chart for details).     Suspect back contusion from impact.  Very low suspicion for bony abnormality so x-ray imaging deferred with shared decision making.  Treat with Zanaflex, Voltaren  gel, heat, massage, stretches.  Return for worsening symptoms.  Final Clinical Impressions(s) / UC Diagnoses   Final diagnoses:  Contusion of back, unspecified laterality, initial encounter     Discharge Instructions      In addition to the prescribed medications, you may use heat, stretches, lidocaine  patches, muscle rubs, rest.  Follow-up for significantly worsening symptoms.    ED Prescriptions     Medication Sig Dispense Auth. Provider   tiZANidine (ZANAFLEX) 4 MG capsule Take 1 capsule (4 mg total) by mouth 3 (three) times daily as needed for muscle spasms. Do not drink alcohol or drive while taking this medication. May cause drowsiness 15 capsule Corbin Dess, PA-C   diclofenac  Sodium (VOLTAREN ) 1 % GEL Apply 2 g topically 4 (four) times daily. 150 g Corbin Dess, [place]      PDMP not reviewed this encounter.   Corbin Dess, [place] 08/27/23 1714

## 2023-09-12 NOTE — Progress Notes (Unsigned)
 Referring Provider: [facility], [facility] Primary Care Physician:  [facility], [facility] Primary GI Physician: Dr. Riley Cheadle  No chief complaint on file.   HPI:   [name] is a 43 y.o. female with history of anxiety, asthma, HTN, constipation, GERD, presenting today for follow-up of constipation.    Patient previously noted worsening constipation, after starting Wegovy. She was started in Linzess  290 mcg in January 2025. Linzess  switched to Motegrity  due to Linzess  being ineffective.   Colonoscopy 06/21/23: - One 8 mm polyp at the ileocecal valve, removed with a cold snare. Resected and retrieved. - The examination was otherwise normal on direct and retroflexion views. - pathology with Juvenile/inflammatory polyp.  - Recommended repeat in 10 years.    EGD 06/21/23:*** - Normal esophagus. - Erythematous mucosa in the antrum. Status post biopsy - Normal duodenal bulb and second portion of the duodenum. - Pathology with moderate nonspecific reactive gastropathy, no H pylori.   Last seen in the office 08/02/23. Constipation remained uncontrolled on Motegrity . GERD well controlled on pantoprazole  BID. Mild intermittent nausea, worse when constipated. Stated she stopped wegovy for 2 months and had no change in her symptoms. Recommended adding Linzess  145 mcg daily to Motegrity , samples provided and requested progress report. Other medications continued.   No progress report received.   Today:     Past Medical History:  Diagnosis Date   AKI (acute kidney injury) (HCC) 08/17/2021   Allergy     banana/pineapple   Anemia    post pregnancy   Anxiety    Asthma    Autoimmune disease, not elsewhere classified(279.49) 2013   Non specific- [facility], abnormal Bone Marrow signal   Breast lump in female 11/28/2012   Has tender round mass at 10-11 0'clock right breast 4 finger breaths from [place] will US    Breast mass, right 08/18/2012   Chlamydia    Chronic breast pain  05/31/2015   Dysmenorrhea 03/23/2014   Dyspareunia 01/05/2014   Elevated prolactin level 02/10/2021   Was 54.5, recheck in December ________   Fibroids, intramural 08/24/2015   GERD (gastroesophageal reflux disease)    Gonorrhea    Headache(784.0)    History of abnormal cervical Pap smear 04/02/2014   Hypertension    Kidney stone    06/2021   LLQ abdominal tenderness 05/31/2015   Menorrhagia 01/05/2014   Neuromuscular disorder (HCC)    lower back and bilat leg pain   Obesity    Other and unspecified ovarian cyst 01/12/2014   Polyclonal gammopathy 07/26/2013   Insignificant   Pyelonephritis 06/28/2021   Recurrent upper respiratory infection (URI)    Scoliosis    Urticaria    Vaginal irritation 09/30/2014   Vaginal Pap smear, abnormal     Past Surgical History:  Procedure Laterality Date   ADENOIDECTOMY     BIOPSY  06/21/2023   Procedure: BIOPSY;  Surgeon: Suzette Espy, MD;  [facility];  Service: Endoscopy;;   BREAST BIOPSY Left 02/2021   fibroadenoma   COLONOSCOPY WITH PROPOFOL  N/A 06/21/2023   Procedure: COLONOSCOPY WITH PROPOFOL ;  Surgeon: Suzette Espy, MD;  [facility];  Service: Endoscopy;  Laterality: N/A;  10:00 am, asa 3   CYSTOSCOPY W/ URETERAL STENT PLACEMENT Left 06/29/2021   Procedure: CYSTOSCOPY WITH RETROGRADE PYELOGRAM/URETERAL STENT PLACEMENT;  Surgeon: Marco Severs, MD;  Location: [facility];  Service: Urology;  Laterality: Left;   CYSTOSCOPY WITH RETROGRADE PYELOGRAM, URETEROSCOPY AND STENT PLACEMENT Left 07/27/2021   Procedure: CYSTOSCOPY  WITH RETROGRADE PYELOGRAM, URETEROSCOPY, STONE EXTRACTION WITH BASKET;  Surgeon: Marco Severs, MD;  Location: [facility];  Service: Urology;  Laterality: Left;   DILATATION AND CURETTAGE/HYSTEROSCOPY WITH MINERVA N/A 03/14/2021   Procedure: HYSTEROSCOPY WITH MINERVA;  Surgeon: Wendelyn Halter, MD;  [facility]: [facility];  Service: Gynecology;  Laterality: N/A;   DILATION AND CURETTAGE OF UTERUS      ESOPHAGOGASTRODUODENOSCOPY (EGD) WITH PROPOFOL  N/A 06/21/2023   Procedure: ESOPHAGOGASTRODUODENOSCOPY (EGD) WITH PROPOFOL ;  Surgeon: Suzette Espy, MD;  [facility];  Service: Endoscopy;  Laterality: N/A;   LAPAROSCOPIC BILATERAL SALPINGECTOMY Bilateral 03/16/2020   Procedure: LAPAROSCOPIC BILATERAL SALPINGECTOMY;  Surgeon: Wendelyn Halter, MD;  [facility];  Service: Gynecology;  Laterality: Bilateral;   POLYPECTOMY  06/21/2023   Procedure: POLYPECTOMY;  Surgeon: Suzette Espy, MD;  [facility];  Service: Endoscopy;;   SHOULDER SURGERY Right 03/10/2019   TONSILLECTOMY     TUBAL LIGATION N/A    Phreesia 06/28/2020    Current Outpatient Medications  Medication Sig Dispense Refill   albuterol  (PROVENTIL  HFA) 108 (90 Base) MCG/ACT inhaler Inhale 2 puffs into the lungs every 6 (six) hours as needed for wheezing (cough). 8 g 5   amLODipine  (NORVASC ) 5 MG tablet Take 1 tablet (5 mg total) by mouth daily. 90 tablet 1   Blood Pressure Monitoring (BLOOD PRESSURE CUFF) MISC Large cuff. ICD10: I10. Check blood pressure once daily. Please contact office if blood pressure above 150/90. 1 each 0   diclofenac  Sodium (VOLTAREN ) 1 % GEL Apply 2 g topically 4 (four) times daily. 150 g 0   fluconazole  (DIFLUCAN ) 150 MG tablet Take 1 tablet (150 mg total) by mouth every 3 (three) days as needed. 2 tablet 0   fluticasone -salmeterol (ADVAIR  HFA) 115-21 MCG/ACT inhaler Inhale 2 puffs into the lungs 2 (two) times daily. 1 each 12   hydrochlorothiazide  (MICROZIDE ) 12.5 MG capsule Take 1 capsule (12.5 mg total) by mouth daily. 90 capsule 1   ipratropium (ATROVENT ) 0.03 % nasal spray Place 2 sprays into both nostrils every 12 (twelve) hours. 30 mL 12   linaclotide  (LINZESS ) 290 MCG CAPS capsule Take 1 capsule (290 mcg total) by mouth daily before breakfast. 30 capsule 5   ondansetron  (ZOFRAN ) 4 MG tablet Take 1 tablet (4 mg total) by mouth every 8 (eight) hours as needed for nausea or  vomiting. 30 tablet 0   pantoprazole  (PROTONIX ) 40 MG tablet Take 1 tablet (40 mg total) by mouth 2 (two) times daily before a meal. 60 tablet 3   Prucalopride Succinate  (MOTEGRITY ) 2 MG TABS Take 1 tablet (2 mg total) by mouth daily. 30 tablet 3   tiZANidine  (ZANAFLEX ) 4 MG capsule Take 1 capsule (4 mg total) by mouth 3 (three) times daily as needed for muscle spasms. Do not drink alcohol or drive while taking this medication. May cause drowsiness 15 capsule 0   WEGOVY 0.5 MG/0.5ML SOAJ Inject 0.5 mg into the skin once a week.     No current facility-administered medications for this visit.    Allergies as of 09/13/2023 - Review Complete 08/26/2023  Allergen Reaction Noted   Banana Rash 12/07/2011   Pineapple Rash 12/07/2011    Family History  Problem Relation Age of Onset   Hypertension Mother    Diabetes Mother    Heart disease Mother    Diabetes Father    Hypertension Maternal Grandmother    Diabetes Son        pre diabetic  Other Son        overactive bladder   COPD Maternal Aunt    Leukemia Maternal Aunt    Cancer Paternal Aunt        breast   Allergic rhinitis Neg Hx    Angioedema Neg Hx    Asthma Neg Hx    Atopy Neg Hx    Eczema Neg Hx    Immunodeficiency Neg Hx    Urticaria Neg Hx    Colon cancer Neg Hx     Social History   Socioeconomic History   Marital status: Divorced    Spouse name: Sales promotion account executive   Number of children: 2   Years of education: Not on file   Highest education level: Not on file  Occupational History   Not on file  Tobacco Use   Smoking status: Never   Smokeless tobacco: Never  Vaping Use   Vaping status: Never Used  Substance and Sexual Activity   Alcohol use: No   Drug use: No   Sexual activity: Yes    Birth control/protection: Surgical    Comment: tubal & ablation  Other Topics Concern   Not on file  Social History Narrative   Not on file   Social Drivers of Health   Financial Resource Strain: Medium Risk (02/12/2022)    Overall Financial Resource Strain (CARDIA)    Difficulty of Paying Living Expenses: Somewhat hard  Food Insecurity: No Food Insecurity (02/12/2022)   Hunger Vital Sign    Worried About Running Out of Food in the Last Year: Never true    Ran Out of Food in the Last Year: Never true  Transportation Needs: No Transportation Needs (04/20/2022)   PRAPARE - Administrator, Civil Service (Medical): No    Lack of Transportation (Non-Medical): No  Physical Activity: Insufficiently Active (02/23/2020)   Exercise Vital Sign    Days of Exercise per Week: 2 days    Minutes of Exercise per Session: 10 min  Stress: Stress Concern Present (09/02/2021)   [employer] of Occupational Health - Occupational Stress Questionnaire    Feeling of Stress : Rather much  Social Connections: Unknown (09/01/2021)   Social Connection and Isolation Panel [NHANES]    Frequency of Communication with Friends and Family: More than three times a week    Frequency of Social Gatherings with Friends and Family: Three times a week    Attends Religious Services: 1 to 4 times per year    Active Member of Clubs or Organizations: No    Attends Banker Meetings: Never    Marital Status: Not on file    Review of Systems: Gen: Denies fever, chills, anorexia. Denies fatigue, weakness, weight loss.  CV: Denies chest pain, palpitations, syncope, peripheral edema, and claudication. Resp: Denies dyspnea at rest, cough, wheezing, coughing up blood, and pleurisy. GI: Denies vomiting blood, jaundice, and fecal incontinence.   Denies dysphagia or odynophagia. Derm: Denies rash, itching, dry skin Psych: Denies depression, anxiety, memory loss, confusion. No homicidal or suicidal ideation.  Heme: Denies bruising, bleeding, and enlarged lymph nodes.  Physical Exam: There were no vitals taken for this visit. General:   Alert and oriented. No distress noted. Pleasant and cooperative.  Head:  Normocephalic and  atraumatic. Eyes:  Conjuctiva clear without scleral icterus. Heart:  S1, S2 present without murmurs appreciated. Lungs:  Clear to auscultation bilaterally. No wheezes, rales, or rhonchi. No distress.  Abdomen:  +BS, soft, non-tender and non-distended. No rebound or guarding. No  HSM or masses noted. Msk:  Symmetrical without gross deformities. Normal posture. Extremities:  Without edema. Neurologic:  Alert and  oriented x4 Psych:  Normal mood and affect.    Assessment:     Plan:  ***   Shana Daring, PA-C [facility] 09/13/2023

## 2023-09-13 ENCOUNTER — Ambulatory Visit: Admitting: Gastroenterology

## 2023-09-13 ENCOUNTER — Encounter: Payer: Self-pay | Admitting: Gastroenterology

## 2023-09-13 VITALS — BP 137/81 | HR 71 | Temp 97.7°F | Ht 65.0 in | Wt 272.2 lb

## 2023-09-13 DIAGNOSIS — R1084 Generalized abdominal pain: Secondary | ICD-10-CM | POA: Diagnosis not present

## 2023-09-13 DIAGNOSIS — R140 Abdominal distension (gaseous): Secondary | ICD-10-CM | POA: Diagnosis not present

## 2023-09-13 DIAGNOSIS — K5909 Other constipation: Secondary | ICD-10-CM

## 2023-09-13 DIAGNOSIS — K5904 Chronic idiopathic constipation: Secondary | ICD-10-CM

## 2023-09-13 MED ORDER — LINACLOTIDE 145 MCG PO CAPS: 145.0000 ug | ORAL_CAPSULE | Freq: Every day | ORAL | 5 refills | Status: AC

## 2023-09-13 NOTE — Patient Instructions (Signed)
 Start Linzess  145 mcg daily, 30 minutes before breakfast.   Continue Motegrity  2 mg daily.   Continue pantoprazole  40 mg twice daily 30 minutes before breakfast and dinner.  I will see back in 8 weeks or sooner if needed.  Shana Daring, PA-C [facility]

## 2023-10-22 ENCOUNTER — Telehealth: Admitting: Physician Assistant

## 2023-10-22 DIAGNOSIS — N760 Acute vaginitis: Secondary | ICD-10-CM | POA: Diagnosis not present

## 2023-10-22 DIAGNOSIS — B9689 Other specified bacterial agents as the cause of diseases classified elsewhere: Secondary | ICD-10-CM

## 2023-10-22 MED ORDER — METRONIDAZOLE 500 MG PO TABS: 500.0000 mg | ORAL_TABLET | Freq: Two times a day (BID) | ORAL | 0 refills | Status: DC

## 2023-10-22 NOTE — Progress Notes (Signed)
 I have spent 5 minutes in review of e-visit questionnaire, review and updating patient chart, medical decision making and response to patient.   Piedad Climes, PA-C

## 2023-10-22 NOTE — Progress Notes (Signed)

## 2023-11-07 NOTE — Progress Notes (Signed)
 Referring Provider: [facility] Primary Care Physician:  [facility] Primary GI Physician: Dr. Shaaron  Chief Complaint  Patient presents with   Follow-up    Pt following up on diarrhea. Pt is doing good with meds    HPI:   [name] is a 43 y.o. female with history of anxiety, asthma, HTN, constipation, GERD, presenting today for follow-up of constipation.    Patient previously noted worsening of chronic constipation after starting Wegovy. She was started in Linzess  290 mcg in January 2025. Linzess  switched to Motegrity  due to inadequate results with Linzess .   Linzess  145 mcg added to Motegrity  08/02/23.   Colonoscopy 06/21/23: - One 8 mm polyp at the ileocecal valve, removed with a cold snare. Resected and retrieved. - The examination was otherwise normal on direct and retroflexion views. - pathology with Juvenile/inflammatory polyp.  - Recommended repeat in 10 years.   EGD 06/21/2023 - Normal esophagus.  - Erythematous mucosa in the antrum. Status post biopsy  - Normal duodenal bulb and second portion of the duodenum.  Last office visit 09/13/2023.  Skipping about 3 days between bowel movements taking Motegrity  only.  Stated samples of Linzess  145 mcg with Motegrity  was helpful.  Noted intermittent abdominal bloating and soreness with no identified aggravating factors.  Suspected this was likely related to constipation.  Recommende adding Linzess  to Motegrity  on a daily basis and following up in 8 weeks.   Today: Bowels are moving well with Motegrity  daily. Occasional takes Linzess  145 mcg, but this is not routine. Typically having good bowel movements every other day.   She is having epigastric fullness. Hurts if she touch it when this occurs. Will have to pull her pants below her stomach. Some nausea. No vomiting. Last month, had 3 episodes. This month only once. No specific triggers. Occurs at random. Not related to meals. Last about 30  minutes. Some burning sensation. Reflux to her throat twice a week. Taking pantoprazole  40 mg before breakfast and before dinner.   Ibuprofen  less than weekly. No other NSAIDs.     Past Medical History:  Diagnosis Date   AKI (acute kidney injury) (HCC) 08/17/2021   Allergy     banana/pineapple   Anemia    post pregnancy   Anxiety    Asthma    Autoimmune disease, not elsewhere classified(279.49) 2013   Non specific- [facility], abnormal Bone Marrow signal   Breast lump in female 11/28/2012   Has tender round mass at 10-11 0'clock right breast 4 finger breaths from [place]ola will US    Breast mass, right 08/18/2012   Chlamydia    Chronic breast pain 05/31/2015   Dysmenorrhea 03/23/2014   Dyspareunia 01/05/2014   Elevated prolactin level 02/10/2021   Was 54.5, recheck in December ________   Fibroids, intramural 08/24/2015   GERD (gastroesophageal reflux disease)    Gonorrhea    Headache(784.0)    History of abnormal cervical Pap smear 04/02/2014   Hypertension    Kidney stone    06/2021   LLQ abdominal tenderness 05/31/2015   Menorrhagia 01/05/2014   Neuromuscular disorder (HCC)    lower back and bilat leg pain   Obesity    Other and unspecified ovarian cyst 01/12/2014   Polyclonal gammopathy 07/26/2013   Insignificant   Pyelonephritis 06/28/2021   Recurrent upper respiratory infection (URI)    Scoliosis    Urticaria    Vaginal irritation 09/30/2014   Vaginal Pap smear, abnormal     Past Surgical  History:  Procedure Laterality Date   ADENOIDECTOMY     BIOPSY  06/21/2023   Procedure: BIOPSY;  Surgeon: Shaaron Lamar HERO, MD;  [facility];  Service: Endoscopy;;   BREAST BIOPSY Left 02/2021   fibroadenoma   COLONOSCOPY WITH PROPOFOL  N/A 06/21/2023   Procedure: COLONOSCOPY WITH PROPOFOL ;  Surgeon: Shaaron Lamar HERO, MD;  [facility];  Service: Endoscopy;  Laterality: N/A;  10:00 am, asa 3   CYSTOSCOPY W/ URETERAL STENT PLACEMENT Left 06/29/2021    Procedure: CYSTOSCOPY WITH RETROGRADE PYELOGRAM/URETERAL STENT PLACEMENT;  Surgeon: Sherrilee Belvie CROME, MD;  Location: [facility];  Service: Urology;  Laterality: Left;   CYSTOSCOPY WITH RETROGRADE PYELOGRAM, URETEROSCOPY AND STENT PLACEMENT Left 07/27/2021   Procedure: CYSTOSCOPY WITH RETROGRADE PYELOGRAM, URETEROSCOPY, STONE EXTRACTION WITH BASKET;  Surgeon: Sherrilee Belvie CROME, MD;  Location: [facility];  Service: Urology;  Laterality: Left;   DILATATION AND CURETTAGE/HYSTEROSCOPY WITH MINERVA N/A 03/14/2021   Procedure: HYSTEROSCOPY WITH MINERVA;  Surgeon: Jayne Vonn DEL, MD;  Location: [facility];  Service: Gynecology;  Laterality: N/A;   DILATION AND CURETTAGE OF UTERUS     ESOPHAGOGASTRODUODENOSCOPY (EGD) WITH PROPOFOL  N/A 06/21/2023   Procedure: ESOPHAGOGASTRODUODENOSCOPY (EGD) WITH PROPOFOL ;  Surgeon: Shaaron Lamar HERO, MD;  [facility];  Service: Endoscopy;  Laterality: N/A;   LAPAROSCOPIC BILATERAL SALPINGECTOMY Bilateral 03/16/2020   Procedure: LAPAROSCOPIC BILATERAL SALPINGECTOMY;  Surgeon: Jayne Vonn DEL, MD;  Location: [facility];  Service: Gynecology;  Laterality: Bilateral;   POLYPECTOMY  06/21/2023   Procedure: POLYPECTOMY;  Surgeon: Shaaron Lamar HERO, MD;  [facility];  Service: Endoscopy;;   SHOULDER SURGERY Right 03/10/2019   TONSILLECTOMY     TUBAL LIGATION N/A    Phreesia 06/28/2020    Current Outpatient Medications  Medication Sig Dispense Refill   albuterol  (PROVENTIL  HFA) 108 (90 Base) MCG/ACT inhaler Inhale 2 puffs into the lungs every 6 (six) hours as needed for wheezing (cough). 8 g 5   amLODipine  (NORVASC ) 5 MG tablet Take 1 tablet (5 mg total) by mouth daily. 90 tablet 1   Blood Pressure Monitoring (BLOOD PRESSURE CUFF) MISC Large cuff. ICD10: I10. Check blood pressure once daily. Please contact office if blood pressure above 150/90. 1 each 0   esomeprazole  (NEXIUM ) 40 MG capsule Take 1 capsule (40 mg total) by mouth 2 (two) times daily before a meal. 60  capsule 3   fluticasone -salmeterol (ADVAIR  HFA) 115-21 MCG/ACT inhaler Inhale 2 puffs into the lungs 2 (two) times daily. 1 each 12   hydrochlorothiazide  (MICROZIDE ) 12.5 MG capsule Take 1 capsule (12.5 mg total) by mouth daily. 90 capsule 1   ipratropium (ATROVENT ) 0.03 % nasal spray Place 2 sprays into both nostrils every 12 (twelve) hours. 30 mL 12   linaclotide  (LINZESS ) 145 MCG CAPS capsule Take 1 capsule (145 mcg total) by mouth daily before breakfast. (Patient taking differently: Take 145 mcg by mouth daily before breakfast. As needed.) 30 capsule 5   ondansetron  (ZOFRAN ) 4 MG tablet Take 1 tablet (4 mg total) by mouth every 8 (eight) hours as needed for nausea or vomiting. 30 tablet 0   Prucalopride Succinate  (MOTEGRITY ) 2 MG TABS Take 1 tablet (2 mg total) by mouth daily. 30 tablet 3   diclofenac  Sodium (VOLTAREN ) 1 % GEL Apply 2 g topically 4 (four) times daily. (Patient not taking: Reported on 11/08/2023) 150 g 0   No current facility-administered medications for this visit.    Allergies as of 11/08/2023 - Review Complete 11/08/2023  Allergen Reaction Noted   Banana Rash 12/07/2011   Pineapple Rash 12/07/2011    Family History  Problem Relation Age of Onset   Hypertension Mother    Diabetes Mother    Heart disease Mother    Diabetes Father    Hypertension Maternal Grandmother    Diabetes Son        pre diabetic   Other Son        overactive bladder   COPD Maternal Aunt    Leukemia Maternal Aunt    Cancer Paternal Aunt        breast   Allergic rhinitis Neg Hx    Angioedema Neg Hx    Asthma Neg Hx    Atopy Neg Hx    Eczema Neg Hx    Immunodeficiency Neg Hx    Urticaria Neg Hx    Colon cancer Neg Hx     Social History   Socioeconomic History   Marital status: Divorced    Spouse name: Sales promotion account executive   Number of children: 2   Years of education: Not on file   Highest education level: Not on file  Occupational History   Not on file  Tobacco Use   Smoking status:  Never   Smokeless tobacco: Never  Vaping Use   Vaping status: Never Used  Substance and Sexual Activity   Alcohol use: No   Drug use: No   Sexual activity: Yes    Birth control/protection: Surgical    Comment: tubal & ablation  Other Topics Concern   Not on file  Social History Narrative   Not on file   Social Drivers of Health   Financial Resource Strain: Medium Risk (02/12/2022)   Overall Financial Resource Strain (CARDIA)    Difficulty of Paying Living Expenses: Somewhat hard  Food Insecurity: No Food Insecurity (02/12/2022)   Hunger Vital Sign    Worried About Running Out of Food in the Last Year: Never true    Ran Out of Food in the Last Year: Never true  Transportation Needs: No Transportation Needs (04/20/2022)   PRAPARE - Administrator, Civil Service (Medical): No    Lack of Transportation (Non-Medical): No  Physical Activity: Insufficiently Active (02/23/2020)   Exercise Vital Sign    Days of Exercise per Week: 2 days    Minutes of Exercise per Session: 10 min  Stress: Stress Concern Present (09/02/2021)   [employer] of Occupational Health - Occupational Stress Questionnaire    Feeling of Stress : Rather much  Social Connections: Unknown (09/01/2021)   Social Connection and Isolation Panel    Frequency of Communication with Friends and Family: More than three times a week    Frequency of Social Gatherings with Friends and Family: Three times a week    Attends Religious Services: 1 to 4 times per year    Active Member of Clubs or Organizations: No    Attends Banker Meetings: Never    Marital Status: Not on file    Review of Systems: GI: See HPI Heme: See HPI  Physical Exam: BP 138/82   Pulse 72   Temp 98.6 F (37 C)   Ht 5' 5 (1.651 m)   Wt 286 lb 6.4 oz (129.9 kg)   BMI 47.66 kg/m  General:   Alert and oriented. No distress noted. Pleasant and cooperative.  Head:  Normocephalic and atraumatic. Eyes:  Conjuctiva clear  without scleral icterus. Heart:  S1, S2 present without murmurs appreciated. Lungs:  Clear to auscultation bilaterally. No wheezes, rales, or rhonchi. No distress.  Abdomen:  +BS, soft, and non-distended.  Mild generalized TTP (chronic) no rebound or guarding. No HSM or masses noted. Msk:  Symmetrical without gross deformities. Normal posture. Extremities:  Without edema. Neurologic:  Alert and  oriented x4 Psych:  Normal mood and affect.    Assessment:  43 y.o. female with history of anxiety, asthma, HTN, constipation, GERD, presenting today for follow-up of constipation. Also with intermittent epigastric pain/nausea and GERD.   Constipation:  Chronic.  Etiology likely CIC versus IBS-C, worsened in the setting of Wegovy which she discontinued about 3 months ago.  Symptoms improved with Motegrity  and also using Linzess  145 mcg as needed.  No alarm symptoms.  Colonoscopy up-to-date.  Upper abdominal pain/nausea: Intermittent epigastric pain/fullness with associated nausea without identified trigger.  Query symptoms related to uncontrolled GERD, gastritis, duodenitis.  EGD for similar symptoms in February 2025 with normal esophagus, gastritis, otherwise normal exam.  No H. pylori on pathology.  As she denies symptoms worsening postprandially, less likely biliary etiology.  No significant early satiety symptoms to suggest gastroparesis.  Will try changing PPI for better management of GERD.  If GERD symptoms improved, but upper abdominal pain continues, would consider pursuing RUQ ultrasound and/or gastric emptying study.   GERD: Uncontrolled on pantoprazole  40 mg twice daily.  Will try transitioning to Nexium  40 mg twice daily.    Plan:  Stop pantoprazole . Start Nexium  40 mg twice daily, 30 minutes before breakfast and dinner. Avoid all NSAIDs is much as possible. Follow a GERD diet:  Avoid fried, fatty, greasy, spicy, citrus foods. Avoid caffeine and carbonated beverages. Avoid  chocolate. Try eating 4-6 small meals a day rather than 3 large meals. Do not eat within 3 hours of laying down. Prop head of bed up on wood or bricks to create a 6 inch incline. Continue Motegrity  2 mg daily.  May continue to use Linzess  145 mcg as needed. Follow-up in 8 weeks or sooner if needed.    Josette Centers, PA-C [facility] 11/08/2023

## 2023-11-08 ENCOUNTER — Ambulatory Visit: Admitting: Gastroenterology

## 2023-11-08 ENCOUNTER — Encounter: Payer: Self-pay | Admitting: Gastroenterology

## 2023-11-08 VITALS — BP 138/82 | HR 72 | Temp 98.6°F | Ht 65.0 in | Wt 286.4 lb

## 2023-11-08 DIAGNOSIS — K5909 Other constipation: Secondary | ICD-10-CM | POA: Diagnosis not present

## 2023-11-08 DIAGNOSIS — R110 Nausea: Secondary | ICD-10-CM

## 2023-11-08 DIAGNOSIS — K219 Gastro-esophageal reflux disease without esophagitis: Secondary | ICD-10-CM

## 2023-11-08 DIAGNOSIS — K581 Irritable bowel syndrome with constipation: Secondary | ICD-10-CM

## 2023-11-08 DIAGNOSIS — R1013 Epigastric pain: Secondary | ICD-10-CM | POA: Diagnosis not present

## 2023-11-08 DIAGNOSIS — R1010 Upper abdominal pain, unspecified: Secondary | ICD-10-CM

## 2023-11-08 MED ORDER — ESOMEPRAZOLE MAGNESIUM 40 MG PO CPDR: 40.0000 mg | DELAYED_RELEASE_CAPSULE | Freq: Two times a day (BID) | ORAL | 3 refills | Status: AC

## 2023-11-08 NOTE — Patient Instructions (Signed)
 Stop pantoprazole  and start Nexium  40 mg twice daily 30 minutes before breakfast and dinner.   Try to avoid all NSAID products including ibuprofen , Aleve , Advil , BC powders, Goody powders, and anything that says NSAID on the package.  Follow a GERD diet:  Avoid fried, fatty, greasy, spicy, citrus foods. Avoid caffeine and carbonated beverages. Avoid chocolate. Try eating 4-6 small meals a day rather than 3 large meals. Do not eat within 3 hours of laying down. Prop head of bed up on wood or bricks to create a 6 inch incline.  Continue Motegrity  2 mg daily.  You may continue to use Linzess  145 mcg as needed.  I will see you back in 8 weeks or sooner if needed.

## 2023-11-11 ENCOUNTER — Telehealth: Admitting: Nurse Practitioner

## 2023-11-11 DIAGNOSIS — B3731 Acute candidiasis of vulva and vagina: Secondary | ICD-10-CM

## 2023-11-11 MED ORDER — FLUCONAZOLE 150 MG PO TABS
150.0000 mg | ORAL_TABLET | Freq: Once | ORAL | 0 refills | Status: AC
Start: 2023-11-11 — End: 2023-11-11

## 2023-11-11 NOTE — Progress Notes (Signed)
 E-Visit for Vaginal Symptoms  We are sorry that you are not feeling well. Here is how we plan to help! Based on what you shared with me it looks like you: May have a yeast vaginosis  Because this does seem like a chronic recurring issue we would recommend follow up and discussion of preventing recurrent vaginitis with your PCP or OBGYN.   Vaginosis is an inflammation of the vagina that can result in discharge, itching and pain. The cause is usually a change in the normal balance of vaginal bacteria or an infection. Vaginosis can also result from reduced estrogen levels after menopause.  The most common causes of vaginosis are:   Bacterial vaginosis which results from an overgrowth of one on several organisms that are normally present in your vagina.   Yeast infections which are caused by a naturally occurring fungus called candida.   Vaginal atrophy (atrophic vaginosis) which results from the thinning of the vagina from reduced estrogen levels after menopause.   Trichomoniasis which is caused by a parasite and is commonly transmitted by sexual intercourse.  Factors that increase your risk of developing vaginosis include: Medications, such as antibiotics and steroids Uncontrolled diabetes Use of hygiene products such as bubble bath, vaginal spray or vaginal deodorant Douching Wearing damp or tight-fitting clothing Using an intrauterine device (IUD) for birth control Hormonal changes, such as those associated with pregnancy, birth control pills or menopause Sexual activity Having a sexually transmitted infection  Your treatment plan is A single Diflucan  (fluconazole ) 150mg  tablet once.  I have electronically sent this prescription into the pharmacy that you have chosen.  Be sure to take all of the medication as directed. Stop taking any medication if you develop a rash, tongue swelling or shortness of breath. Mothers who are breast feeding should consider pumping and discarding their  breast milk while on these antibiotics. However, there is no consensus that infant exposure at these doses would be harmful.  Remember that medication creams can weaken latex condoms. SABRA   HOME CARE:  Good hygiene may prevent some types of vaginosis from recurring and may relieve some symptoms:  Avoid baths, hot tubs and whirlpool spas. Rinse soap from your outer genital area after a shower, and dry the area well to prevent irritation. Don't use scented or harsh soaps, such as those with deodorant or antibacterial action. Avoid irritants. These include scented tampons and pads. Wipe from front to back after using the toilet. Doing so avoids spreading fecal bacteria to your vagina.  Other things that may help prevent vaginosis include:  Don't douche. Your vagina doesn't require cleansing other than normal bathing. Repetitive douching disrupts the normal organisms that reside in the vagina and can actually increase your risk of vaginal infection. Douching won't clear up a vaginal infection. Use a latex condom. Both female and female latex condoms may help you avoid infections spread by sexual contact. Wear cotton underwear. Also wear pantyhose with a cotton crotch. If you feel comfortable without it, skip wearing underwear to bed. Yeast thrives in [employer] Your symptoms should improve in the next day or two.  GET HELP RIGHT AWAY IF:  You have pain in your lower abdomen ( pelvic area or over your ovaries) You develop nausea or vomiting You develop a fever Your discharge changes or worsens You have persistent pain with intercourse You develop shortness of breath, a rapid pulse, or you faint.  These symptoms could be signs of problems or infections that need to be evaluated  by a medical provider now.  MAKE SURE YOU   Understand these instructions. Will watch your condition. Will get help right away if you are not doing well or get worse.  Thank you for choosing an  e-visit.  Your e-visit answers were reviewed by a board certified advanced clinical practitioner to complete your personal care plan. Depending upon the condition, your plan could have included both over the counter or prescription medications.  Please review your pharmacy choice. Make sure the pharmacy is open so you can pick up prescription now. If there is a problem, you may contact your provider through [employer] and have the prescription routed to another pharmacy.  Your safety is important to us . If you have drug allergies check your prescription carefully.   For the next 24 hours you can use MyChart to ask questions about today's visit, request a non-urgent call back, or ask for a work or school excuse. You will get an email in the next two days asking about your experience. I hope that your e-visit has been valuable and will speed your recovery.  I spent approximately 5 minutes reviewing the patient's history, current symptoms and coordinating their care today.

## 2023-12-05 ENCOUNTER — Other Ambulatory Visit (HOSPITAL_COMMUNITY)
Admission: RE | Admit: 2023-12-05 | Discharge: 2023-12-05 | Disposition: A | Discharge status: Home / Self Care | Source: Ambulatory Visit | Attending: Adult Health | Admitting: Adult Health

## 2023-12-05 ENCOUNTER — Ambulatory Visit: Admitting: Adult Health

## 2023-12-05 ENCOUNTER — Encounter: Payer: Self-pay | Admitting: Adult Health

## 2023-12-05 VITALS — BP 136/82 | HR 83 | Ht 65.0 in | Wt 289.0 lb

## 2023-12-05 DIAGNOSIS — N939 Abnormal uterine and vaginal bleeding, unspecified: Secondary | ICD-10-CM | POA: Diagnosis not present

## 2023-12-05 DIAGNOSIS — Z124 Encounter for screening for malignant neoplasm of cervix: Secondary | ICD-10-CM | POA: Insufficient documentation

## 2023-12-05 DIAGNOSIS — Z1151 Encounter for screening for human papillomavirus (HPV): Secondary | ICD-10-CM | POA: Insufficient documentation

## 2023-12-05 DIAGNOSIS — Z3202 Encounter for pregnancy test, result negative: Secondary | ICD-10-CM | POA: Diagnosis not present

## 2023-12-05 DIAGNOSIS — R102 Pelvic and perineal pain: Secondary | ICD-10-CM

## 2023-12-05 LAB — POCT URINE PREGNANCY: Preg Test, Ur: NEGATIVE

## 2023-12-05 NOTE — Progress Notes (Signed)
  Subjective:     Patient ID: [name], female   DOB: 27-Apr-1981, 43 y.o.   MRN: [id number]  HPI [name] is a 43 year old black female, divorced, G2P2002 in complaining of pelvic pain (both sides)and bleeding heavy for 2 days this week then stopped, has had spotting monthly since last year. She is sp ablation and tubal. She needs a pap too.  PCP is [facility]   Review of Systems +pelvic pain (both sides) +bleeding heavy for 2 days this week then stopped, has had spotting monthly since last year. She is sp ablation and tubal.   Breasts get tender had times, too Reviewed past medical,surgical, social and family history. Reviewed medications and allergies.  Objective:   Physical Exam BP 136/82 (BP Location: Right Arm, Patient Position: Sitting, Cuff Size: Large)   Pulse 83   Ht 5' 5 (1.651 m)   Wt 289 lb (131.1 kg)   LMP 12/03/2023 (Exact Date)   BMI 48.09 kg/m  UPT is negative  Skin warm and dry.Pelvic: external genitalia is normal in appearance no lesions, vagina: pink,urethra has no lesions or masses noted, cervix:smooth and bulbous, +blood at os, pap with HR HPV genotyping and GC/CHL,trich performed, uterus: normal size, shape and contour, mildly tender, no masses felt, adnexa: no masses, + tenderness noted, L>R. Bladder is non tender and no masses felt.  [facility] - 12/05/23 0905       Pregnancy Intention Screening   Does the patient want to become pregnant in the next year? No    Does the patient's partner want to become pregnant in the next year? No    Would the patient like to discuss contraceptive options today? No      Contraception Wrap Up   Current Method Female Sterilization    End Method Female Sterilization    Contraception Counseling Provided No            Examination chaperoned by Clarita Salt LPN  Assessment:     1. Negative pregnancy test - POCT urine pregnancy  2. Routine Papanicolaou smear Pap sent Pap in 3 years if normal  -  Cytology - PAP( [facility])  3. Pelvic pain (Primary) +pelvic pain, L>R Will  get pelvic US  at [facility] 12/12/23 at 10 am to assess uterus and ovaries, will talk when results back Can take tylenol  and advil  for pain and increase fluids  - US  PELVIC COMPLETE WITH TRANSVAGINAL; Future  4. Abnormal uterine bleeding (AUB) +bleeding heavy for 2 days this week then stopped, has had spotting monthly since last year. She is sp ablation and tubal. She needs a pap too. - US  PELVIC COMPLETE WITH TRANSVAGINAL; Future     Plan:     Follow up TBD

## 2023-12-09 LAB — CYTOLOGY - PAP
Adequacy: ABSENT
Chlamydia: NEGATIVE
Comment: NEGATIVE
Comment: NEGATIVE
Comment: NEGATIVE
Comment: NORMAL
Diagnosis: NEGATIVE
High risk HPV: NEGATIVE
Neisseria Gonorrhea: NEGATIVE
Trichomonas: NEGATIVE

## 2023-12-10 ENCOUNTER — Ambulatory Visit: Payer: Self-pay | Admitting: Adult Health

## 2023-12-12 ENCOUNTER — Ambulatory Visit (HOSPITAL_COMMUNITY)
Admission: RE | Admit: 2023-12-12 | Discharge: 2023-12-12 | Disposition: A | Discharge status: Home / Self Care | Source: Ambulatory Visit | Attending: Adult Health | Admitting: Adult Health

## 2023-12-12 DIAGNOSIS — R102 Pelvic and perineal pain: Secondary | ICD-10-CM | POA: Insufficient documentation

## 2023-12-12 DIAGNOSIS — N939 Abnormal uterine and vaginal bleeding, unspecified: Secondary | ICD-10-CM | POA: Insufficient documentation

## 2023-12-13 ENCOUNTER — Telehealth: Admitting: Physician Assistant

## 2023-12-13 DIAGNOSIS — N760 Acute vaginitis: Secondary | ICD-10-CM

## 2023-12-13 DIAGNOSIS — B9689 Other specified bacterial agents as the cause of diseases classified elsewhere: Secondary | ICD-10-CM | POA: Diagnosis not present

## 2023-12-13 MED ORDER — METRONIDAZOLE 500 MG PO TABS: 500.0000 mg | ORAL_TABLET | Freq: Two times a day (BID) | ORAL | 0 refills | Status: AC

## 2023-12-13 NOTE — Progress Notes (Signed)
 E-Visit for Vaginal Symptoms  We are sorry that you are not feeling well. Here is how we plan to help! Based on what you shared with me it looks like you: May have a vaginosis due to bacteria  Vaginosis is an inflammation of the vagina that can result in discharge, itching and pain. The cause is usually a change in the normal balance of vaginal bacteria or an infection. Vaginosis can also result from reduced estrogen levels after menopause.  The most common causes of vaginosis are:   Bacterial vaginosis which results from an overgrowth of one on several organisms that are normally present in your vagina.   Yeast infections which are caused by a naturally occurring fungus called candida.   Vaginal atrophy (atrophic vaginosis) which results from the thinning of the vagina from reduced estrogen levels after menopause.   Trichomoniasis which is caused by a parasite and is commonly transmitted by sexual intercourse.  Factors that increase your risk of developing vaginosis include: Medications, such as antibiotics and steroids Uncontrolled diabetes Use of hygiene products such as bubble bath, vaginal spray or vaginal deodorant Douching Wearing damp or tight-fitting clothing Using an intrauterine device (IUD) for birth control Hormonal changes, such as those associated with pregnancy, birth control pills or menopause Sexual activity Having a sexually transmitted infection  Your treatment plan is Metronidazole or Flagyl 500mg  twice a day for 7 days.  I have electronically sent this prescription into the pharmacy that you have chosen.  Be sure to take all of the medication as directed. Stop taking any medication if you develop a rash, tongue swelling or shortness of breath. Mothers who are breast feeding should consider pumping and discarding their breast milk while on these antibiotics. However, there is no consensus that infant exposure at these doses would be harmful.  Remember that  medication creams can weaken latex condoms. SABRA   HOME CARE:  Good hygiene may prevent some types of vaginosis from recurring and may relieve some symptoms:  Avoid baths, hot tubs and whirlpool spas. Rinse soap from your outer genital area after a shower, and dry the area well to prevent irritation. Don't use scented or harsh soaps, such as those with deodorant or antibacterial action. Avoid irritants. These include scented tampons and pads. Wipe from front to back after using the toilet. Doing so avoids spreading fecal bacteria to your vagina.  Other things that may help prevent vaginosis include:  Don't douche. Your vagina doesn't require cleansing other than normal bathing. Repetitive douching disrupts the normal organisms that reside in the vagina and can actually increase your risk of vaginal infection. Douching won't clear up a vaginal infection. Use a latex condom. Both female and female latex condoms may help you avoid infections spread by sexual contact. Wear cotton underwear. Also wear pantyhose with a cotton crotch. If you feel comfortable without it, skip wearing underwear to bed. Yeast thrives in [employer] Your symptoms should improve in the next day or two.  GET HELP RIGHT AWAY IF:  You have pain in your lower abdomen ( pelvic area or over your ovaries) You develop nausea or vomiting You develop a fever Your discharge changes or worsens You have persistent pain with intercourse You develop shortness of breath, a rapid pulse, or you faint.  These symptoms could be signs of problems or infections that need to be evaluated by a medical provider now.  MAKE SURE YOU   Understand these instructions. Will watch your condition. Will get help right  away if you are not doing well or get worse.  Thank you for choosing an e-visit.  Your e-visit answers were reviewed by a board certified advanced clinical practitioner to complete your personal care plan. Depending upon the  condition, your plan could have included both over the counter or prescription medications.  Please review your pharmacy choice. Make sure the pharmacy is open so you can pick up prescription now. If there is a problem, you may contact your provider through [employer] and have the prescription routed to another pharmacy.  Your safety is important to us . If you have drug allergies check your prescription carefully.   For the next 24 hours you can use MyChart to ask questions about today's visit, request a non-urgent call back, or ask for a work or school excuse. You will get an email in the next two days asking about your experience. I hope that your e-visit has been valuable and will speed your recovery.  I have spent 5 minutes in review of e-visit questionnaire, review and updating patient chart, medical decision making and response to patient.   Delon CHRISTELLA Dickinson, PA-C

## 2023-12-23 ENCOUNTER — Telehealth: Admitting: Physician Assistant

## 2023-12-23 DIAGNOSIS — B379 Candidiasis, unspecified: Secondary | ICD-10-CM | POA: Diagnosis not present

## 2023-12-23 DIAGNOSIS — T3695XA Adverse effect of unspecified systemic antibiotic, initial encounter: Secondary | ICD-10-CM | POA: Diagnosis not present

## 2023-12-23 MED ORDER — FLUCONAZOLE 150 MG PO TABS: 150.0000 mg | ORAL_TABLET | ORAL | 0 refills | Status: DC | PRN

## 2023-12-23 NOTE — Progress Notes (Signed)
 E-Visit for Vaginal Symptoms  We are sorry that you are not feeling well. Here is how we plan to help! Based on what you shared with me it looks like you: May have a yeast vaginosis due to recent antibiotic use.  Vaginosis is an inflammation of the vagina that can result in discharge, itching and pain. The cause is usually a change in the normal balance of vaginal bacteria or an infection. Vaginosis can also result from reduced estrogen levels after menopause.  The most common causes of vaginosis are:   Bacterial vaginosis which results from an overgrowth of one on several organisms that are normally present in your vagina.   Yeast infections which are caused by a naturally occurring fungus called candida.   Vaginal atrophy (atrophic vaginosis) which results from the thinning of the vagina from reduced estrogen levels after menopause.   Trichomoniasis which is caused by a parasite and is commonly transmitted by sexual intercourse.  Factors that increase your risk of developing vaginosis include: Medications, such as antibiotics and steroids Uncontrolled diabetes Use of hygiene products such as bubble bath, vaginal spray or vaginal deodorant Douching Wearing damp or tight-fitting clothing Using an intrauterine device (IUD) for birth control Hormonal changes, such as those associated with pregnancy, birth control pills or menopause Sexual activity Having a sexually transmitted infection  Your treatment plan is A single Diflucan  (fluconazole ) 150mg  tablet once.  I have electronically sent this prescription into the pharmacy that you have chosen. I have sent an extra tablet to take 3 days after the first if symptoms linger.   Be sure to take all of the medication as directed. Stop taking any medication if you develop a rash, tongue swelling or shortness of breath. Mothers who are breast feeding should consider pumping and discarding their breast milk while on these antibiotics. However,  there is no consensus that infant exposure at these doses would be harmful.  Remember that medication creams can weaken latex condoms. SABRA   HOME CARE:  Good hygiene may prevent some types of vaginosis from recurring and may relieve some symptoms:  Avoid baths, hot tubs and whirlpool spas. Rinse soap from your outer genital area after a shower, and dry the area well to prevent irritation. Don't use scented or harsh soaps, such as those with deodorant or antibacterial action. Avoid irritants. These include scented tampons and pads. Wipe from front to back after using the toilet. Doing so avoids spreading fecal bacteria to your vagina.  Other things that may help prevent vaginosis include:  Don't douche. Your vagina doesn't require cleansing other than normal bathing. Repetitive douching disrupts the normal organisms that reside in the vagina and can actually increase your risk of vaginal infection. Douching won't clear up a vaginal infection. Use a latex condom. Both female and female latex condoms may help you avoid infections spread by sexual contact. Wear cotton underwear. Also wear pantyhose with a cotton crotch. If you feel comfortable without it, skip wearing underwear to bed. Yeast thrives in [employer] Your symptoms should improve in the next day or two.  GET HELP RIGHT AWAY IF:  You have pain in your lower abdomen ( pelvic area or over your ovaries) You develop nausea or vomiting You develop a fever Your discharge changes or worsens You have persistent pain with intercourse You develop shortness of breath, a rapid pulse, or you faint.  These symptoms could be signs of problems or infections that need to be evaluated by a medical provider now.  MAKE SURE YOU   Understand these instructions. Will watch your condition. Will get help right away if you are not doing well or get worse.  Thank you for choosing an e-visit.  Your e-visit answers were reviewed by a board  certified advanced clinical practitioner to complete your personal care plan. Depending upon the condition, your plan could have included both over the counter or prescription medications.  Please review your pharmacy choice. Make sure the pharmacy is open so you can pick up prescription now. If there is a problem, you may contact your provider through [employer] and have the prescription routed to another pharmacy.  Your safety is important to us . If you have drug allergies check your prescription carefully.   For the next 24 hours you can use MyChart to ask questions about today's visit, request a non-urgent call back, or ask for a work or school excuse. You will get an email in the next two days asking about your experience. I hope that your e-visit has been valuable and will speed your recovery.    I have spent 5 minutes in review of e-visit questionnaire, review and updating patient chart, medical decision making and response to patient.   Delon CHRISTELLA Dickinson, PA-C

## 2023-12-26 ENCOUNTER — Ambulatory Visit: Admitting: Adult Health

## 2024-01-01 ENCOUNTER — Encounter: Payer: Self-pay | Admitting: Adult Health

## 2024-01-01 ENCOUNTER — Ambulatory Visit: Admitting: Adult Health

## 2024-01-01 VITALS — BP 132/86 | HR 98 | Ht 65.0 in | Wt 291.0 lb

## 2024-01-01 DIAGNOSIS — N939 Abnormal uterine and vaginal bleeding, unspecified: Secondary | ICD-10-CM | POA: Diagnosis not present

## 2024-01-01 DIAGNOSIS — R102 Pelvic and perineal pain: Secondary | ICD-10-CM | POA: Diagnosis not present

## 2024-01-01 DIAGNOSIS — D219 Benign neoplasm of connective and other soft tissue, unspecified: Secondary | ICD-10-CM | POA: Insufficient documentation

## 2024-01-01 DIAGNOSIS — D259 Leiomyoma of uterus, unspecified: Secondary | ICD-10-CM

## 2024-01-01 MED ORDER — NORETHINDRONE 0.35 MG PO TABS: 1.0000 | ORAL_TABLET | Freq: Every day | ORAL | 6 refills | Status: AC

## 2024-01-01 NOTE — Progress Notes (Addendum)
  Subjective:     Patient ID: [name], female   DOB: 16-Dec-1980, 43 y.o.   MRN: [id number]  HPI [name] is a 43 year old black female, divorced, G2P2002, in complaining of heavy bleeding, and pain. She is sp ablation. Had US  12/12/23:IMPRESSION: 1. Possible submucosal fibroid within the posterior uterine corpus and fundus measuring up to 2.8 cm. Endometrial thickness within normal limits for premenopausal patient 2. Dilated bilateral parauterine vessels, nonspecific but can be seen in the setting of pelvic congestion syndrome. 3. Trace free fluid.    Component Value Date/Time   DIAGPAP  12/05/2023 0908    - Negative for intraepithelial lesion or malignancy (NILM)   DIAGPAP  02/07/2021 1646    - Negative for intraepithelial lesion or malignancy (NILM)   DIAGPAP  04/10/2018 0000    NEGATIVE FOR INTRAEPITHELIAL LESIONS OR MALIGNANCY.   DIAGPAP  04/10/2018 0000    FUNGAL ORGANISMS PRESENT CONSISTENT WITH CANDIDA SPP.   HPVHIGH Negative 12/05/2023 0908   HPVHIGH Negative 02/07/2021 1646   ADEQPAP  12/05/2023 0908    Satisfactory for evaluation; transformation zone component ABSENT.   ADEQPAP  02/07/2021 1646    Satisfactory for evaluation; transformation zone component PRESENT.   ADEQPAP  04/10/2018 0000    Satisfactory for evaluation  endocervical/transformation zone component PRESENT.   PCP is [facility]  Review of Systems +heavy bleeding and pain, bleed 12/26/23 to last night heavy, lite now Has some nausea and headache the weekend Reviewed past medical,surgical, social and family history. Reviewed medications and allergies.     Objective:   Physical Exam BP 132/86 (BP Location: Right Arm, Patient Position: Sitting, Cuff Size: Large)   Pulse 98   Ht 5' 5 (1.651 m)   Wt 291 lb (132 kg)   LMP 12/26/2023 (Exact Date)   BMI 48.42 kg/m     Skin warm and dry.Pelvic: external genitalia is normal in appearance no lesions, vagina: pink,sscant brown  discharge,urethra has no lesions or masses noted, cervix:smooth and bulbous, uterus: normal size, shape and contour, non tender, no masses felt, adnexa: no masses or tenderness noted. Bladder is non tender and no masses felt  Upstream - 01/01/24 1520       Pregnancy Intention Screening   Does the patient want to become pregnant in the next year? No    Does the patient's partner want to become pregnant in the next year? No    Would the patient like to discuss contraceptive options today? No      Contraception Wrap Up   Current Method Female Sterilization    End Method Female Sterilization    Contraception Counseling Provided No         Examination chaperoned by Clarita Salt LPN  Assessment:     1. Pelvic pain (Primary) + pain with bleeding Will try micronor , can start now Meds ordered this encounter  Medications   norethindrone  (MICRONOR ) 0.35 MG tablet    Sig: Take 1 tablet (0.35 mg total) by mouth daily.    Dispense:  28 tablet    Refill:  6    Supervising Provider:   JAYNE MINDER H [[id number]]     2. Abnormal uterine bleeding (AUB) Will try micronor  Discussed IUD as option too 3. Fibroid     Plan:     Follow up in 8 weeks

## 2024-01-03 ENCOUNTER — Ambulatory Visit: Admitting: Gastroenterology

## 2024-01-27 NOTE — Progress Notes (Deleted)
 GI Office Note    Referring Provider: [facility] Primary Care Physician:  [facility] Primary Gastroenterologist: Lamar HERO.Rourk, MD  Date:  01/27/2024  ID:  [name], DOB 13-Feb-1981, MRN [id number]   Chief Complaint   No chief complaint on file.  History of Present Illness  [name] is a 43 y.o. female with a history of IBS-C, HTN, anxiety, asthma, and GERD presenting today with complaint of ***  Early January she presented with worsening abdominal pain and constipation secondary to starting [facility] and was then prescribed 290 mcg daily which was then switched to Motegrity  given ineffective results.   Colonoscopy 06/21/23: - One 8 mm polyp at the ileocecal valve, removed with a cold snare. Resected and retrieved. - The examination was otherwise normal on direct and retroflexion views. - pathology with Juvenile/inflammatory polyp.  - Recommended repeat in 10 years.    EGD 06/21/2023 - Normal esophagus.  - Erythematous mucosa in the antrum. Status post biopsy  - Normal duodenal bulb and second portion of the duodenum.  April 2025 Linzess  145 mcg was added to Motegrity .   OV in May 2025 she was having a BM every 3-4 days with Motegrity  only and did report Linzess  145 with Motegrity  provided good results. Was experiencing some bloating suspected to be secondary to constipation.   Last office visit 11/08/23 with Josette Centers, PA.Taking Motegrity  once daily and Linzess  145 as needed and having a BM every other day. Noted epigastric fullness and some nausea randomly and note related to meals. Occasional burning sensation as well.Taking pantoprazole  40 mg BID. Advised change in PPI (to nexium ) and advised RUQ US  and/or GES if symptoms continue.    Today:  Discussed the use of AI scribe software for clinical note transcription with the patient, who gave verbal consent to proceed.    Wt Readings from Last 5 Encounters:  01/01/24 291  lb (132 kg)  12/05/23 289 lb (131.1 kg)  11/08/23 286 lb 6.4 oz (129.9 kg)  09/13/23 272 lb 3.2 oz (123.5 kg)  08/02/23 276 lb 9.6 oz (125.5 kg)    Current Outpatient Medications  Medication Sig Dispense Refill   Acetaminophen  (TYLENOL  PO) Take by mouth.     albuterol  (PROVENTIL  HFA) 108 (90 Base) MCG/ACT inhaler Inhale 2 puffs into the lungs every 6 (six) hours as needed for wheezing (cough). 8 g 5   amLODipine  (NORVASC ) 5 MG tablet Take 1 tablet (5 mg total) by mouth daily. 90 tablet 1   Blood Pressure Monitoring (BLOOD PRESSURE CUFF) MISC Large cuff. ICD10: I10. Check blood pressure once daily. Please contact office if blood pressure above 150/90. 1 each 0   esomeprazole  (NEXIUM ) 40 MG capsule Take 1 capsule (40 mg total) by mouth 2 (two) times daily before a meal. 60 capsule 3   fluticasone -salmeterol (ADVAIR  HFA) 115-21 MCG/ACT inhaler Inhale 2 puffs into the lungs 2 (two) times daily. 1 each 12   hydrochlorothiazide  (MICROZIDE ) 12.5 MG capsule Take 1 capsule (12.5 mg total) by mouth daily. 90 capsule 1   ibuprofen  (ADVIL ) 200 MG tablet Take 800 mg by mouth every 6 (six) hours as needed.     linaclotide  (LINZESS ) 145 MCG CAPS capsule Take 1 capsule (145 mcg total) by mouth daily before breakfast. 30 capsule 5   norethindrone  (MICRONOR ) 0.35 MG tablet Take 1 tablet (0.35 mg total) by mouth daily. 28 tablet 6   ondansetron  (ZOFRAN ) 4 MG tablet Take 1 tablet (4 mg total) by mouth  every 8 (eight) hours as needed for nausea or vomiting. 30 tablet 0   Prucalopride Succinate  (MOTEGRITY ) 2 MG TABS Take 1 tablet (2 mg total) by mouth daily. 30 tablet 3   No current facility-administered medications for this visit.    Past Medical History:  Diagnosis Date   AKI (acute kidney injury) 08/17/2021   Allergy     banana/pineapple   Anemia    post pregnancy   Anxiety    Asthma    Autoimmune disease, not elsewhere classified(279.49) 2013   Non specific- [facility], abnormal Bone Marrow  signal   Breast lump in female 11/28/2012   Has tender round mass at 10-11 0'clock right breast 4 finger breaths from areola will US    Breast mass, right 08/18/2012   Chlamydia    Chronic breast pain 05/31/2015   Dysmenorrhea 03/23/2014   Dyspareunia 01/05/2014   Elevated prolactin level 02/10/2021   Was 54.5, recheck in December ________   Fibroids, intramural 08/24/2015   GERD (gastroesophageal reflux disease)    Gonorrhea    Headache(784.0)    History of abnormal cervical Pap smear 04/02/2014   Hypertension    Kidney stone    06/2021   LLQ abdominal tenderness 05/31/2015   Menorrhagia 01/05/2014   Neuromuscular disorder (HCC)    lower back and bilat leg pain   Obesity    Other and unspecified ovarian cyst 01/12/2014   Polyclonal gammopathy 07/26/2013   Insignificant   Pyelonephritis 06/28/2021   Recurrent upper respiratory infection (URI)    Scoliosis    Urticaria    Vaginal irritation 09/30/2014   Vaginal Pap smear, abnormal     Past Surgical History:  Procedure Laterality Date   ADENOIDECTOMY     BIOPSY  06/21/2023   Procedure: BIOPSY;  Surgeon: Shaaron Lamar HERO, MD;  [facility];  Service: Endoscopy;;   BREAST BIOPSY Left 02/2021   fibroadenoma   COLONOSCOPY WITH PROPOFOL  N/A 06/21/2023   Procedure: COLONOSCOPY WITH PROPOFOL ;  Surgeon: Shaaron Lamar HERO, MD;  [facility];  Service: Endoscopy;  Laterality: N/A;  10:00 am, asa 3   CYSTOSCOPY W/ URETERAL STENT PLACEMENT Left 06/29/2021   Procedure: CYSTOSCOPY WITH RETROGRADE PYELOGRAM/URETERAL STENT PLACEMENT;  Surgeon: Sherrilee Belvie CROME, MD;  Location: [facility];  Service: Urology;  Laterality: Left;   CYSTOSCOPY WITH RETROGRADE PYELOGRAM, URETEROSCOPY AND STENT PLACEMENT Left 07/27/2021   Procedure: CYSTOSCOPY WITH RETROGRADE PYELOGRAM, URETEROSCOPY, STONE EXTRACTION WITH BASKET;  Surgeon: Sherrilee Belvie CROME, MD;  Location: [facility];  Service: Urology;  Laterality: Left;   DILATATION AND  CURETTAGE/HYSTEROSCOPY WITH MINERVA N/A 03/14/2021   Procedure: HYSTEROSCOPY WITH MINERVA;  Surgeon: Jayne Vonn DEL, MD;  Location: [facility];  Service: Gynecology;  Laterality: N/A;   DILATION AND CURETTAGE OF UTERUS     ESOPHAGOGASTRODUODENOSCOPY (EGD) WITH PROPOFOL  N/A 06/21/2023   Procedure: ESOPHAGOGASTRODUODENOSCOPY (EGD) WITH PROPOFOL ;  Surgeon: Shaaron Lamar HERO, MD;  [facility];  Service: Endoscopy;  Laterality: N/A;   LAPAROSCOPIC BILATERAL SALPINGECTOMY Bilateral 03/16/2020   Procedure: LAPAROSCOPIC BILATERAL SALPINGECTOMY;  Surgeon: Jayne Vonn DEL, MD;  Location: [facility];  Service: Gynecology;  Laterality: Bilateral;   POLYPECTOMY  06/21/2023   Procedure: POLYPECTOMY;  Surgeon: Shaaron Lamar HERO, MD;  [facility];  Service: Endoscopy;;   SHOULDER SURGERY Right 03/10/2019   TONSILLECTOMY     TUBAL LIGATION N/A    [employer]reesia 06/28/2020    Family History  Problem Relation Age of Onset   Hypertension Mother  Diabetes Mother    Heart disease Mother    Diabetes Father    Hypertension Maternal Grandmother    Diabetes Son        pre diabetic   Other Son        overactive bladder   COPD Maternal Aunt    Leukemia Maternal Aunt    Cancer Paternal Aunt        breast   Allergic rhinitis Neg Hx    Angioedema Neg Hx    Asthma Neg Hx    Atopy Neg Hx    Eczema Neg Hx    Immunodeficiency Neg Hx    Urticaria Neg Hx    Colon cancer Neg Hx     Allergies as of 02/04/2024 - Review Complete 01/01/2024  Allergen Reaction Noted   Banana Rash 12/07/2011   Pineapple Rash 12/07/2011    Social History   Socioeconomic History   Marital status: Divorced    Spouse name: Jameel   Number of children: 2   Years of education: Not on file   Highest education level: Not on file  Occupational History   Not on file  Tobacco Use   Smoking status: Never   Smokeless tobacco: Never  Vaping Use   Vaping status: Never Used  Substance and Sexual Activity   Alcohol use: No    Drug use: No   Sexual activity: Yes    Birth control/protection: Surgical    Comment: tubal & ablation  Other Topics Concern   Not on file  Social History Narrative   Not on file   Social Drivers of Health   Financial Resource Strain: Medium Risk (02/12/2022)   Overall Financial Resource Strain (CARDIA)    Difficulty of Paying Living Expenses: Somewhat hard  Food Insecurity: No Food Insecurity (02/12/2022)   Hunger Vital Sign    Worried About Running Out of Food in the Last Year: Never true    Ran Out of Food in the Last Year: Never true  Transportation Needs: No Transportation Needs (04/20/2022)   PRAPARE - Administrator, Civil Service (Medical): No    Lack of Transportation (Non-Medical): No  Physical Activity: Insufficiently Active (02/23/2020)   Exercise Vital Sign    Days of Exercise per Week: 2 days    Minutes of Exercise per Session: 10 min  Stress: Stress Concern Present (09/02/2021)   [name] [employer] - Occupational Stress Questionnaire    Feeling of Stress : Rather much  Social Connections: Unknown (09/01/2021)   Social Connection and Isolation Panel    Frequency of Communication with Friends and Family: More than three times a week    Frequency of Social Gatherings with Friends and Family: Three times a week    Attends Religious Services: 1 to 4 times per year    Active Member of Clubs or Organizations: No    Attends Banker Meetings: Never    Marital Status: Not on file     Review of Systems   Gen: Denies fever, chills, anorexia. Denies fatigue, weakness, weight loss.  CV: Denies chest pain, palpitations, syncope, peripheral edema, and claudication. Resp: Denies dyspnea at rest, cough, wheezing, coughing up blood, and pleurisy. GI: See HPI Derm: Denies rash, itching, dry skin Psych: Denies depression, anxiety, memory loss, confusion. No homicidal or suicidal ideation.  Heme: Denies bruising, bleeding, and  enlarged lymph nodes.  Physical Exam   There were no vitals taken for this visit.  General:   Alert and  oriented. No distress noted. Pleasant and cooperative.  Head:  Normocephalic and atraumatic. Eyes:  Conjuctiva clear without scleral icterus. Mouth:  Oral mucosa pink and moist. Good dentition. No lesions. Lungs:  Clear to auscultation bilaterally. No wheezes, rales, or rhonchi. No distress.  Heart:  S1, S2 present without murmurs appreciated.  Abdomen:  +BS, soft, non-tender and non-distended. No rebound or guarding. No HSM or masses noted. Rectal: *** Msk:  Symmetrical without gross deformities. Normal posture. Extremities:  Without edema. Neurologic:  Alert and  oriented x4 Psych:  Alert and cooperative. Normal mood and affect.  Assessment  LAYNE LEBON is a 43 y.o. female presenting today with ***    PLAN   *** Follow up ***    Charmaine Melia, MSN, FNP-BC, AGACNP-BC [facility]

## 2024-02-04 ENCOUNTER — Ambulatory Visit: Admitting: Gastroenterology

## 2024-02-04 DIAGNOSIS — R110 Nausea: Secondary | ICD-10-CM

## 2024-02-04 DIAGNOSIS — K581 Irritable bowel syndrome with constipation: Secondary | ICD-10-CM

## 2024-02-04 DIAGNOSIS — K219 Gastro-esophageal reflux disease without esophagitis: Secondary | ICD-10-CM

## 2024-02-04 DIAGNOSIS — R1010 Upper abdominal pain, unspecified: Secondary | ICD-10-CM

## 2024-02-04 DIAGNOSIS — R140 Abdominal distension (gaseous): Secondary | ICD-10-CM

## 2024-02-05 ENCOUNTER — Encounter: Payer: Self-pay | Admitting: Gastroenterology

## 2024-02-23 ENCOUNTER — Telehealth: Admitting: Physician Assistant

## 2024-02-23 DIAGNOSIS — N760 Acute vaginitis: Secondary | ICD-10-CM | POA: Diagnosis not present

## 2024-02-23 DIAGNOSIS — B9689 Other specified bacterial agents as the cause of diseases classified elsewhere: Secondary | ICD-10-CM | POA: Diagnosis not present

## 2024-02-24 MED ORDER — METRONIDAZOLE 500 MG PO TABS: 500.0000 mg | ORAL_TABLET | Freq: Two times a day (BID) | ORAL | 0 refills | Status: AC

## 2024-02-24 NOTE — Progress Notes (Signed)

## 2024-02-26 ENCOUNTER — Ambulatory Visit: Admitting: Adult Health

## 2024-02-27 ENCOUNTER — Encounter: Payer: Self-pay | Admitting: Adult Health

## 2024-02-27 ENCOUNTER — Ambulatory Visit: Admitting: Adult Health

## 2024-02-27 VITALS — BP 136/85 | HR 79 | Ht 65.0 in | Wt 290.0 lb

## 2024-02-27 DIAGNOSIS — N644 Mastodynia: Secondary | ICD-10-CM | POA: Diagnosis not present

## 2024-02-27 DIAGNOSIS — N6452 Nipple discharge: Secondary | ICD-10-CM

## 2024-02-27 DIAGNOSIS — N6315 Unspecified lump in the right breast, overlapping quadrants: Secondary | ICD-10-CM

## 2024-02-27 DIAGNOSIS — N921 Excessive and frequent menstruation with irregular cycle: Secondary | ICD-10-CM

## 2024-02-27 DIAGNOSIS — R102 Pelvic and perineal pain unspecified side: Secondary | ICD-10-CM | POA: Diagnosis not present

## 2024-02-27 DIAGNOSIS — I10 Essential (primary) hypertension: Secondary | ICD-10-CM

## 2024-02-27 NOTE — Progress Notes (Signed)
 Subjective:     Patient ID: [name], female   DOB: [birth year], 43 y.o.   MRN: [id number]  HPI [name] is a 43 year old black female, divorced, G2P2 in complaining of right nipple discharge, in bra yesterday, dark yellow, and has pain in right breast. She is on Micronor (started in September) and periods are not as long, still heavy first 4-5 days, may change every 2 hours, pad and tampon and has pelvic pain at times. Has slight headache today too.     Component Value Date/Time   DIAGPAP  12/05/2023 0908    - Negative for intraepithelial lesion or malignancy (NILM)   DIAGPAP  02/07/2021 1646    - Negative for intraepithelial lesion or malignancy (NILM)   DIAGPAP  04/10/2018 0000    NEGATIVE FOR INTRAEPITHELIAL LESIONS OR MALIGNANCY.   DIAGPAP  04/10/2018 0000    FUNGAL ORGANISMS PRESENT CONSISTENT WITH CANDIDA SPP.   HPVHIGH Negative 12/05/2023 0908   HPVHIGH Negative 02/07/2021 1646   ADEQPAP  12/05/2023 0908    Satisfactory for evaluation; transformation zone component ABSENT.   ADEQPAP  02/07/2021 1646    Satisfactory for evaluation; transformation zone component PRESENT.   ADEQPAP  04/10/2018 0000    Satisfactory for evaluation  endocervical/transformation zone component PRESENT.    PCP is [facility]   Review of Systems +right nipple discharge, in bra yesterday, dark yellow, and has pain in right breast. She is on Micronor (started in September) and periods are not as long, still heavy first 4-5 days, may change every 2 hours, pad and tampon and has pelvic pain at times. Has slight headache today too.   Reviewed past medical,surgical, social and family history. Reviewed medications and allergies.  Objective:   Physical Exam BP 136/85 (BP Location: Right Arm, Patient Position: Sitting, Cuff Size: Normal)   Pulse 79   Ht 5' 5 (1.651 m)   Wt 290 lb (131.5 kg)   LMP 01/16/2024 (Exact Date)   BMI 48.26 kg/m      Skin warm and dry,  Breasts:no dominate  palpable mass, retraction or nipple discharge on the left, on the right no discharge not or retraction, has mass at 3 0'clock at areola  and it is tender when palpated.  [facility] - 02/27/24 1011       Pregnancy Intention Screening   Does the patient want to become pregnant in the next year? No    Does the patient's partner want to become pregnant in the next year? No    Would the patient like to discuss contraceptive options today? No      Contraception Wrap Up   Current Method Female Sterilization;Oral Contraceptive    End Method Female Sterilization;Oral Contraceptive          Assessment:     1. Breast discharge (Primary) Had dark yellow discharge in bra yesterday Had TSH and prolactin before exam Will get dx rt mammogram and US  - TSH - Prolactin - US  LIMITED ULTRASOUND INCLUDING AXILLA RIGHT BREAST; Future - MM Digital Diagnostic Unilat R; Future  2. Mass overlapping multiple quadrants of right breast Has mass in right breast at 3 0'clock at areola, it is tender Scheduled diagnostic right mammogram and US  at [facility] 03/12/24 at 10 am - US  LIMITED ULTRASOUND INCLUDING AXILLA RIGHT BREAST; Future - MM Digital Diagnostic Unilat R; Future  3. Breast pain, right +pain in right breast Will get mammogram and US   - US  LIMITED ULTRASOUND INCLUDING AXILLA RIGHT BREAST; Future - MM  Digital Diagnostic Unilat R; Future  4. Pelvic pain Still has at times, continue micronor    5. Menorrhagia with irregular cycle Periods not as long still heavy 4-5 days Will continue Micronor  has refills   6. Hypertension, unspecified type Continue Norvasc  5 mg 1 daily and follow up with PCP    Plan:     Follow up in 3 months for ROS

## 2024-02-28 ENCOUNTER — Ambulatory Visit: Payer: Self-pay | Admitting: Adult Health

## 2024-02-28 LAB — PROLACTIN: Prolactin: 15.6 ng/mL (ref 4.8–33.4)

## 2024-02-28 LAB — TSH: TSH: 1.09 u[IU]/mL (ref 0.450–4.500)

## 2024-03-03 ENCOUNTER — Ambulatory Visit (HOSPITAL_COMMUNITY)
Admission: RE | Admit: 2024-03-03 | Discharge: 2024-03-03 | Disposition: A | Discharge status: Home / Self Care | Source: Ambulatory Visit | Attending: Adult Health | Admitting: Adult Health

## 2024-03-03 ENCOUNTER — Telehealth: Payer: Self-pay | Admitting: Adult Health

## 2024-03-03 ENCOUNTER — Other Ambulatory Visit: Payer: Self-pay | Admitting: Adult Health

## 2024-03-03 ENCOUNTER — Ambulatory Visit (HOSPITAL_COMMUNITY): Admission: RE | Admit: 2024-03-03 | Discharge: 2024-03-03 | Attending: Adult Health | Admitting: Adult Health

## 2024-03-03 ENCOUNTER — Encounter (HOSPITAL_COMMUNITY): Payer: Self-pay

## 2024-03-03 DIAGNOSIS — N6315 Unspecified lump in the right breast, overlapping quadrants: Secondary | ICD-10-CM | POA: Diagnosis present

## 2024-03-03 DIAGNOSIS — N644 Mastodynia: Secondary | ICD-10-CM | POA: Diagnosis present

## 2024-03-03 DIAGNOSIS — N6452 Nipple discharge: Secondary | ICD-10-CM

## 2024-03-03 NOTE — Telephone Encounter (Signed)
 Pt aware MR breast 03/12/24 at 12:30 pm

## 2024-03-03 NOTE — Progress Notes (Signed)
 Will get MR breast, bilateral with and without contrast at [facility]

## 2024-03-12 ENCOUNTER — Encounter (HOSPITAL_COMMUNITY)

## 2024-03-12 ENCOUNTER — Ambulatory Visit (HOSPITAL_COMMUNITY)
Admission: RE | Admit: 2024-03-12 | Discharge: 2024-03-12 | Disposition: A | Discharge status: Home / Self Care | Source: Ambulatory Visit | Attending: Adult Health | Admitting: Adult Health

## 2024-03-12 ENCOUNTER — Ambulatory Visit (HOSPITAL_COMMUNITY)

## 2024-03-12 ENCOUNTER — Ambulatory Visit: Payer: Self-pay | Admitting: Adult Health

## 2024-03-12 DIAGNOSIS — N644 Mastodynia: Secondary | ICD-10-CM | POA: Insufficient documentation

## 2024-03-12 DIAGNOSIS — N6452 Nipple discharge: Secondary | ICD-10-CM | POA: Insufficient documentation

## 2024-03-12 MED ORDER — GADOBUTROL 1 MMOL/ML IV SOLN
10.0000 mL | Freq: Once | INTRAVENOUS | Status: AC | PRN
  Administered 2024-03-12: 10 mL via INTRAVENOUS

## 2024-03-19 ENCOUNTER — Telehealth: Admitting: Physician Assistant

## 2024-03-19 DIAGNOSIS — B3731 Acute candidiasis of vulva and vagina: Secondary | ICD-10-CM

## 2024-03-19 NOTE — Progress Notes (Signed)

## 2024-03-20 ENCOUNTER — Encounter

## 2024-03-20 MED ORDER — FLUCONAZOLE 150 MG PO TABS: 150.0000 mg | ORAL_TABLET | ORAL | 0 refills | Status: DC | PRN

## 2024-03-20 NOTE — Addendum Note (Signed)
 Addended by: VIVIENNE DELON HERO on: 03/20/2024 03:06 PM   Modules accepted: Orders

## 2024-03-30 ENCOUNTER — Telehealth: Admitting: Family Medicine

## 2024-03-30 DIAGNOSIS — N760 Acute vaginitis: Secondary | ICD-10-CM

## 2024-03-30 NOTE — Progress Notes (Signed)
  Because you were recently treated by our group in Nov and Oct for BV and yeast, this time you will need in person for swab treatment. We feel your condition warrants further evaluation and we recommend that you be seen in a face-to-face visit.   NOTE: There will be NO CHARGE for this E-Visit   If you are having a true medical emergency, please call 911.

## 2024-05-12 ENCOUNTER — Telehealth

## 2024-05-12 DIAGNOSIS — B9689 Other specified bacterial agents as the cause of diseases classified elsewhere: Secondary | ICD-10-CM

## 2024-05-12 DIAGNOSIS — N760 Acute vaginitis: Secondary | ICD-10-CM

## 2024-05-13 MED ORDER — METRONIDAZOLE 500 MG PO TABS: 500.0000 mg | ORAL_TABLET | Freq: Two times a day (BID) | ORAL | 0 refills | Status: AC

## 2024-05-13 NOTE — Progress Notes (Signed)

## 2024-05-29 ENCOUNTER — Ambulatory Visit: Admitting: Adult Health

## 2024-06-03 ENCOUNTER — Ambulatory Visit: Admitting: Adult Health

## 2024-06-08 ENCOUNTER — Ambulatory Visit: Admitting: Adult Health
